# Patient Record
Sex: Male | Born: 1935 | Race: White | Hispanic: No | Marital: Married | Smoking: Former smoker
Health system: Southern US, Community
[De-identification: ages and names within clinical notes are randomized; demographics above are authoritative.]

## PROBLEM LIST (undated history)

## (undated) DIAGNOSIS — M1990 Unspecified osteoarthritis, unspecified site: Secondary | ICD-10-CM

## (undated) DIAGNOSIS — R399 Unspecified symptoms and signs involving the genitourinary system: Secondary | ICD-10-CM

## (undated) DIAGNOSIS — I519 Heart disease, unspecified: Secondary | ICD-10-CM

## (undated) DIAGNOSIS — I10 Essential (primary) hypertension: Secondary | ICD-10-CM

## (undated) DIAGNOSIS — I444 Left anterior fascicular block: Secondary | ICD-10-CM

## (undated) DIAGNOSIS — J3489 Other specified disorders of nose and nasal sinuses: Secondary | ICD-10-CM

## (undated) DIAGNOSIS — F329 Major depressive disorder, single episode, unspecified: Secondary | ICD-10-CM

## (undated) DIAGNOSIS — K5730 Diverticulosis of large intestine without perforation or abscess without bleeding: Secondary | ICD-10-CM

## (undated) DIAGNOSIS — R0600 Dyspnea, unspecified: Secondary | ICD-10-CM

## (undated) DIAGNOSIS — I7121 Aneurysm of the ascending aorta, without rupture: Secondary | ICD-10-CM

## (undated) DIAGNOSIS — S82853A Displaced trimalleolar fracture of unspecified lower leg, initial encounter for closed fracture: Secondary | ICD-10-CM

## (undated) DIAGNOSIS — D649 Anemia, unspecified: Secondary | ICD-10-CM

## (undated) DIAGNOSIS — D3501 Benign neoplasm of right adrenal gland: Secondary | ICD-10-CM

## (undated) DIAGNOSIS — F32A Depression, unspecified: Secondary | ICD-10-CM

## (undated) DIAGNOSIS — E785 Hyperlipidemia, unspecified: Secondary | ICD-10-CM

## (undated) DIAGNOSIS — Z860100 Personal history of colon polyps, unspecified: Secondary | ICD-10-CM

## (undated) DIAGNOSIS — I517 Cardiomegaly: Secondary | ICD-10-CM

## (undated) DIAGNOSIS — I2510 Atherosclerotic heart disease of native coronary artery without angina pectoris: Secondary | ICD-10-CM

## (undated) DIAGNOSIS — G4733 Obstructive sleep apnea (adult) (pediatric): Secondary | ICD-10-CM

## (undated) DIAGNOSIS — R351 Nocturia: Secondary | ICD-10-CM

## (undated) DIAGNOSIS — I700 Atherosclerosis of aorta: Secondary | ICD-10-CM

## (undated) DIAGNOSIS — J449 Chronic obstructive pulmonary disease, unspecified: Secondary | ICD-10-CM

## (undated) DIAGNOSIS — C801 Malignant (primary) neoplasm, unspecified: Secondary | ICD-10-CM

## (undated) DIAGNOSIS — R2681 Unsteadiness on feet: Secondary | ICD-10-CM

## (undated) DIAGNOSIS — I272 Pulmonary hypertension, unspecified: Secondary | ICD-10-CM

## (undated) DIAGNOSIS — N400 Enlarged prostate without lower urinary tract symptoms: Secondary | ICD-10-CM

## (undated) DIAGNOSIS — R05 Cough: Secondary | ICD-10-CM

## (undated) DIAGNOSIS — E291 Testicular hypofunction: Secondary | ICD-10-CM

## (undated) DIAGNOSIS — R918 Other nonspecific abnormal finding of lung field: Secondary | ICD-10-CM

## (undated) DIAGNOSIS — R053 Chronic cough: Secondary | ICD-10-CM

## (undated) DIAGNOSIS — S68119A Complete traumatic metacarpophalangeal amputation of unspecified finger, initial encounter: Secondary | ICD-10-CM

## (undated) DIAGNOSIS — Z87442 Personal history of urinary calculi: Secondary | ICD-10-CM

## (undated) DIAGNOSIS — R42 Dizziness and giddiness: Secondary | ICD-10-CM

## (undated) DIAGNOSIS — I340 Nonrheumatic mitral (valve) insufficiency: Secondary | ICD-10-CM

## (undated) DIAGNOSIS — I82409 Acute embolism and thrombosis of unspecified deep veins of unspecified lower extremity: Secondary | ICD-10-CM

## (undated) DIAGNOSIS — I739 Peripheral vascular disease, unspecified: Secondary | ICD-10-CM

## (undated) DIAGNOSIS — J189 Pneumonia, unspecified organism: Secondary | ICD-10-CM

## (undated) DIAGNOSIS — I4510 Unspecified right bundle-branch block: Secondary | ICD-10-CM

## (undated) DIAGNOSIS — K760 Fatty (change of) liver, not elsewhere classified: Secondary | ICD-10-CM

## (undated) DIAGNOSIS — T07XXXA Unspecified multiple injuries, initial encounter: Secondary | ICD-10-CM

## (undated) DIAGNOSIS — I2699 Other pulmonary embolism without acute cor pulmonale: Secondary | ICD-10-CM

## (undated) DIAGNOSIS — Z8719 Personal history of other diseases of the digestive system: Secondary | ICD-10-CM

## (undated) DIAGNOSIS — R001 Bradycardia, unspecified: Secondary | ICD-10-CM

## (undated) DIAGNOSIS — I712 Thoracic aortic aneurysm, without rupture: Secondary | ICD-10-CM

## (undated) HISTORY — DX: Unspecified multiple injuries, initial encounter: T07.XXXA

## (undated) HISTORY — DX: Acute embolism and thrombosis of unspecified deep veins of unspecified lower extremity: I82.409

## (undated) HISTORY — DX: Testicular hypofunction: E29.1

## (undated) HISTORY — DX: Thoracic aortic aneurysm, without rupture: I71.2

## (undated) HISTORY — DX: Chronic obstructive pulmonary disease, unspecified: J44.9

## (undated) HISTORY — DX: Pulmonary hypertension, unspecified: I27.20

## (undated) HISTORY — DX: Major depressive disorder, single episode, unspecified: F32.9

## (undated) HISTORY — PX: LARYNGOSCOPY: SUR817

## (undated) HISTORY — DX: Depression, unspecified: F32.A

## (undated) HISTORY — DX: Aneurysm of the ascending aorta, without rupture: I71.21

## (undated) HISTORY — PX: OMENTECTOMY: SHX2098

## (undated) HISTORY — DX: Malignant (primary) neoplasm, unspecified: C80.1

## (undated) HISTORY — DX: Hyperlipidemia, unspecified: E78.5

## (undated) HISTORY — DX: Other pulmonary embolism without acute cor pulmonale: I26.99

## (undated) HISTORY — PX: CATARACT EXTRACTION W/ INTRAOCULAR LENS  IMPLANT, BILATERAL: SHX1307

## (undated) HISTORY — DX: Atherosclerotic heart disease of native coronary artery without angina pectoris: I25.10

## (undated) HISTORY — DX: Obstructive sleep apnea (adult) (pediatric): G47.33

## (undated) HISTORY — DX: Displaced trimalleolar fracture of unspecified lower leg, initial encounter for closed fracture: S82.853A

## (undated) HISTORY — PX: ANKLE FRACTURE SURGERY: SHX122

## (undated) HISTORY — DX: Essential (primary) hypertension: I10

## (undated) HISTORY — DX: Benign prostatic hyperplasia without lower urinary tract symptoms: N40.0

## (undated) HISTORY — PX: PROSTATE SURGERY: SHX751

---

## 1997-09-23 ENCOUNTER — Encounter: Payer: Self-pay | Admitting: Family Medicine

## 1997-09-23 LAB — CONVERTED CEMR LAB: PSA: 0.4 ng/mL

## 1998-07-24 ENCOUNTER — Encounter: Payer: Self-pay | Admitting: Family Medicine

## 1998-07-24 LAB — CONVERTED CEMR LAB: Hgb A1c MFr Bld: 6 %

## 1998-09-23 ENCOUNTER — Encounter: Payer: Self-pay | Admitting: Family Medicine

## 1999-06-24 ENCOUNTER — Encounter: Payer: Self-pay | Admitting: Family Medicine

## 1999-07-04 ENCOUNTER — Ambulatory Visit (HOSPITAL_COMMUNITY): Admission: RE | Admit: 1999-07-04 | Discharge: 1999-07-04 | Payer: Self-pay | Admitting: Family Medicine

## 1999-11-24 ENCOUNTER — Encounter: Payer: Self-pay | Admitting: Family Medicine

## 2000-01-22 ENCOUNTER — Encounter: Payer: Self-pay | Admitting: Family Medicine

## 2000-01-22 LAB — FECAL OCCULT BLOOD, GUAIAC: Fecal Occult Blood: NEGATIVE

## 2000-01-22 LAB — CONVERTED CEMR LAB
Hgb A1c MFr Bld: 6.1 %
Microalbumin U total vol: 22.7 mg/L

## 2001-03-24 ENCOUNTER — Other Ambulatory Visit: Admission: RE | Admit: 2001-03-24 | Discharge: 2001-03-24 | Payer: Self-pay | Admitting: Otolaryngology

## 2001-03-24 ENCOUNTER — Encounter (INDEPENDENT_AMBULATORY_CARE_PROVIDER_SITE_OTHER): Payer: Self-pay

## 2001-04-08 ENCOUNTER — Ambulatory Visit (HOSPITAL_BASED_OUTPATIENT_CLINIC_OR_DEPARTMENT_OTHER): Admission: RE | Admit: 2001-04-08 | Discharge: 2001-04-08 | Payer: Self-pay | Admitting: Otolaryngology

## 2001-04-08 ENCOUNTER — Encounter (INDEPENDENT_AMBULATORY_CARE_PROVIDER_SITE_OTHER): Payer: Self-pay | Admitting: Specialist

## 2001-06-23 ENCOUNTER — Encounter: Payer: Self-pay | Admitting: Family Medicine

## 2001-07-04 HISTORY — PX: HERNIA REPAIR: SHX51

## 2001-08-23 ENCOUNTER — Encounter: Payer: Self-pay | Admitting: Family Medicine

## 2001-08-23 LAB — CONVERTED CEMR LAB
Microalbumin U total vol: 5.1 mg/L
PSA: 0.6 ng/mL

## 2002-02-21 ENCOUNTER — Encounter: Payer: Self-pay | Admitting: Family Medicine

## 2003-05-29 ENCOUNTER — Ambulatory Visit (HOSPITAL_BASED_OUTPATIENT_CLINIC_OR_DEPARTMENT_OTHER): Admission: RE | Admit: 2003-05-29 | Discharge: 2003-05-29 | Payer: Self-pay | Admitting: Otolaryngology

## 2003-05-29 ENCOUNTER — Encounter (INDEPENDENT_AMBULATORY_CARE_PROVIDER_SITE_OTHER): Payer: Self-pay | Admitting: *Deleted

## 2003-09-24 ENCOUNTER — Encounter: Payer: Self-pay | Admitting: Family Medicine

## 2003-09-24 LAB — CONVERTED CEMR LAB
Hgb A1c MFr Bld: 9.1 %
Microalbumin U total vol: 31.1 mg/L

## 2003-12-25 ENCOUNTER — Encounter: Payer: Self-pay | Admitting: Family Medicine

## 2003-12-25 LAB — CONVERTED CEMR LAB
Hgb A1c MFr Bld: 7.9 %
Hgb A1c MFr Bld: 7.9 %
Microalbumin U total vol: 23.7 mg/L

## 2004-03-23 ENCOUNTER — Encounter: Payer: Self-pay | Admitting: Family Medicine

## 2004-03-23 LAB — CONVERTED CEMR LAB
Hgb A1c MFr Bld: 7 %
PSA: 0.4 ng/mL
PSA: 0.4 ng/mL

## 2004-10-15 ENCOUNTER — Ambulatory Visit: Payer: Self-pay | Admitting: Family Medicine

## 2004-10-23 ENCOUNTER — Encounter: Payer: Self-pay | Admitting: Family Medicine

## 2004-11-04 ENCOUNTER — Ambulatory Visit: Payer: Self-pay | Admitting: Family Medicine

## 2004-11-10 ENCOUNTER — Ambulatory Visit: Payer: Self-pay | Admitting: Family Medicine

## 2005-03-23 ENCOUNTER — Encounter: Payer: Self-pay | Admitting: Family Medicine

## 2005-03-23 LAB — CONVERTED CEMR LAB
Hgb A1c MFr Bld: 5.4 %
Hgb A1c MFr Bld: 5.4 %
Microalbumin U total vol: 26.8 mg/L
Microalbumin U total vol: 26.8 mg/L
PSA: 0.41 ng/mL
PSA: 0.41 ng/mL

## 2005-04-02 ENCOUNTER — Ambulatory Visit: Payer: Self-pay | Admitting: Family Medicine

## 2005-04-06 ENCOUNTER — Ambulatory Visit: Payer: Self-pay | Admitting: Family Medicine

## 2005-04-21 ENCOUNTER — Ambulatory Visit: Payer: Self-pay | Admitting: *Deleted

## 2005-04-22 ENCOUNTER — Ambulatory Visit: Payer: Self-pay

## 2005-08-26 ENCOUNTER — Ambulatory Visit: Payer: Self-pay | Admitting: Family Medicine

## 2005-09-17 ENCOUNTER — Ambulatory Visit: Payer: Self-pay | Admitting: Internal Medicine

## 2005-09-23 ENCOUNTER — Encounter: Payer: Self-pay | Admitting: Family Medicine

## 2005-09-23 LAB — CONVERTED CEMR LAB
Hgb A1c MFr Bld: 6.1 %
Hgb A1c MFr Bld: 6.1 %

## 2005-09-24 ENCOUNTER — Ambulatory Visit: Payer: Self-pay | Admitting: Internal Medicine

## 2005-09-24 ENCOUNTER — Inpatient Hospital Stay (HOSPITAL_BASED_OUTPATIENT_CLINIC_OR_DEPARTMENT_OTHER): Admission: RE | Admit: 2005-09-24 | Discharge: 2005-09-24 | Payer: Self-pay | Admitting: Internal Medicine

## 2005-09-24 HISTORY — PX: CARDIAC CATHETERIZATION: SHX172

## 2005-10-06 ENCOUNTER — Ambulatory Visit: Payer: Self-pay | Admitting: Internal Medicine

## 2005-10-06 ENCOUNTER — Ambulatory Visit (HOSPITAL_COMMUNITY): Admission: RE | Admit: 2005-10-06 | Discharge: 2005-10-06 | Payer: Self-pay | Admitting: Internal Medicine

## 2005-10-09 ENCOUNTER — Ambulatory Visit: Payer: Self-pay | Admitting: Cardiology

## 2005-10-14 ENCOUNTER — Ambulatory Visit: Payer: Self-pay | Admitting: Family Medicine

## 2005-10-23 ENCOUNTER — Ambulatory Visit: Payer: Self-pay | Admitting: Pulmonary Disease

## 2005-11-05 ENCOUNTER — Encounter (HOSPITAL_COMMUNITY): Admission: RE | Admit: 2005-11-05 | Discharge: 2006-02-03 | Payer: Self-pay | Admitting: Pulmonary Disease

## 2005-11-23 ENCOUNTER — Encounter: Payer: Self-pay | Admitting: Family Medicine

## 2005-11-23 LAB — CONVERTED CEMR LAB
Hgb A1c MFr Bld: 6.2 %
Hgb A1c MFr Bld: 6.2 %

## 2005-12-01 ENCOUNTER — Ambulatory Visit: Payer: Self-pay | Admitting: Internal Medicine

## 2005-12-01 ENCOUNTER — Ambulatory Visit: Payer: Self-pay | Admitting: Family Medicine

## 2005-12-02 ENCOUNTER — Ambulatory Visit: Payer: Self-pay | Admitting: Family Medicine

## 2005-12-21 ENCOUNTER — Ambulatory Visit: Payer: Self-pay | Admitting: Pulmonary Disease

## 2005-12-23 ENCOUNTER — Ambulatory Visit: Payer: Self-pay | Admitting: Internal Medicine

## 2005-12-24 ENCOUNTER — Encounter: Payer: Self-pay | Admitting: Family Medicine

## 2005-12-24 LAB — CONVERTED CEMR LAB
Hgb A1c MFr Bld: 5.9 %
Hgb A1c MFr Bld: 5.9 %

## 2005-12-28 ENCOUNTER — Ambulatory Visit: Payer: Self-pay | Admitting: Family Medicine

## 2006-03-23 ENCOUNTER — Encounter: Payer: Self-pay | Admitting: Family Medicine

## 2006-03-23 LAB — CONVERTED CEMR LAB
Hgb A1c MFr Bld: 6 %
PSA: 0.64 ng/mL
PSA: 0.64 ng/mL

## 2006-03-25 ENCOUNTER — Ambulatory Visit: Payer: Self-pay | Admitting: Internal Medicine

## 2006-03-26 ENCOUNTER — Ambulatory Visit: Payer: Self-pay | Admitting: Family Medicine

## 2006-03-29 ENCOUNTER — Ambulatory Visit: Payer: Self-pay | Admitting: Family Medicine

## 2006-04-02 ENCOUNTER — Ambulatory Visit: Payer: Self-pay | Admitting: Family Medicine

## 2006-06-29 ENCOUNTER — Ambulatory Visit: Payer: Self-pay | Admitting: Family Medicine

## 2006-08-24 ENCOUNTER — Ambulatory Visit: Payer: Self-pay | Admitting: Internal Medicine

## 2006-08-31 ENCOUNTER — Ambulatory Visit: Payer: Self-pay | Admitting: Family Medicine

## 2006-09-14 ENCOUNTER — Ambulatory Visit: Payer: Self-pay | Admitting: Emergency Medicine

## 2006-10-02 ENCOUNTER — Ambulatory Visit (HOSPITAL_BASED_OUTPATIENT_CLINIC_OR_DEPARTMENT_OTHER): Admission: RE | Admit: 2006-10-02 | Discharge: 2006-10-02 | Payer: Self-pay | Admitting: Emergency Medicine

## 2006-10-11 ENCOUNTER — Ambulatory Visit: Payer: Self-pay | Admitting: Emergency Medicine

## 2006-10-12 ENCOUNTER — Ambulatory Visit: Payer: Self-pay | Admitting: Pulmonary Disease

## 2006-11-23 ENCOUNTER — Encounter: Payer: Self-pay | Admitting: Family Medicine

## 2006-11-23 LAB — CONVERTED CEMR LAB: Hgb A1c MFr Bld: 6.7 %

## 2006-11-25 ENCOUNTER — Ambulatory Visit: Payer: Self-pay | Admitting: Family Medicine

## 2006-11-26 ENCOUNTER — Ambulatory Visit: Payer: Self-pay | Admitting: Emergency Medicine

## 2006-11-29 ENCOUNTER — Ambulatory Visit: Payer: Self-pay | Admitting: Family Medicine

## 2006-12-16 ENCOUNTER — Ambulatory Visit: Payer: Self-pay | Admitting: Family Medicine

## 2006-12-16 LAB — CONVERTED CEMR LAB
BUN: 17 mg/dL (ref 6–23)
CO2: 32 meq/L (ref 19–32)
Calcium: 10.1 mg/dL (ref 8.4–10.5)
Creatinine, Ser: 1 mg/dL (ref 0.4–1.5)
GFR calc non Af Amer: 78 mL/min
Glucose, Bld: 110 mg/dL — ABNORMAL HIGH (ref 70–99)
Hgb A1c MFr Bld: 6.6 % — ABNORMAL HIGH (ref 4.6–6.0)
Sodium: 138 meq/L (ref 135–145)

## 2006-12-21 ENCOUNTER — Ambulatory Visit: Payer: Self-pay | Admitting: Family Medicine

## 2007-01-22 ENCOUNTER — Encounter: Payer: Self-pay | Admitting: Family Medicine

## 2007-01-22 LAB — CONVERTED CEMR LAB: Hgb A1c MFr Bld: 6.8 %

## 2007-02-04 ENCOUNTER — Ambulatory Visit: Payer: Self-pay | Admitting: Family Medicine

## 2007-02-04 LAB — CONVERTED CEMR LAB
Hgb A1c MFr Bld: 6.8 % — ABNORMAL HIGH (ref 4.6–6.0)
LDL Cholesterol: 110 mg/dL — ABNORMAL HIGH (ref 0–99)

## 2007-02-08 ENCOUNTER — Ambulatory Visit: Payer: Self-pay | Admitting: Family Medicine

## 2007-02-15 ENCOUNTER — Ambulatory Visit: Payer: Self-pay | Admitting: Cardiology

## 2007-02-24 ENCOUNTER — Ambulatory Visit: Payer: Self-pay | Admitting: Internal Medicine

## 2007-03-10 ENCOUNTER — Encounter (INDEPENDENT_AMBULATORY_CARE_PROVIDER_SITE_OTHER): Payer: Self-pay | Admitting: Specialist

## 2007-03-10 ENCOUNTER — Ambulatory Visit: Payer: Self-pay | Admitting: Internal Medicine

## 2007-04-04 ENCOUNTER — Telehealth: Payer: Self-pay | Admitting: Family Medicine

## 2007-04-05 ENCOUNTER — Ambulatory Visit: Payer: Self-pay | Admitting: Family Medicine

## 2007-04-05 DIAGNOSIS — R739 Hyperglycemia, unspecified: Secondary | ICD-10-CM | POA: Insufficient documentation

## 2007-05-05 ENCOUNTER — Encounter: Payer: Self-pay | Admitting: Family Medicine

## 2007-05-05 ENCOUNTER — Ambulatory Visit: Payer: Self-pay | Admitting: Family Medicine

## 2007-05-05 DIAGNOSIS — N400 Enlarged prostate without lower urinary tract symptoms: Secondary | ICD-10-CM | POA: Insufficient documentation

## 2007-05-05 DIAGNOSIS — G4730 Sleep apnea, unspecified: Secondary | ICD-10-CM | POA: Insufficient documentation

## 2007-05-05 DIAGNOSIS — Z9989 Dependence on other enabling machines and devices: Secondary | ICD-10-CM

## 2007-05-05 DIAGNOSIS — I10 Essential (primary) hypertension: Secondary | ICD-10-CM | POA: Insufficient documentation

## 2007-05-05 DIAGNOSIS — I2510 Atherosclerotic heart disease of native coronary artery without angina pectoris: Secondary | ICD-10-CM | POA: Insufficient documentation

## 2007-05-05 DIAGNOSIS — E7800 Pure hypercholesterolemia, unspecified: Secondary | ICD-10-CM

## 2007-05-05 DIAGNOSIS — E113299 Type 2 diabetes mellitus with mild nonproliferative diabetic retinopathy without macular edema, unspecified eye: Secondary | ICD-10-CM | POA: Insufficient documentation

## 2007-05-05 DIAGNOSIS — G4733 Obstructive sleep apnea (adult) (pediatric): Secondary | ICD-10-CM

## 2007-05-05 DIAGNOSIS — E11319 Type 2 diabetes mellitus with unspecified diabetic retinopathy without macular edema: Secondary | ICD-10-CM

## 2007-05-05 LAB — CONVERTED CEMR LAB
HDL: 53.2 mg/dL (ref >39.0)
Hgb A1c MFr Bld: 6.5 % — ABNORMAL HIGH (ref 4.6–6.0)

## 2007-05-07 DIAGNOSIS — F528 Other sexual dysfunction not due to a substance or known physiological condition: Secondary | ICD-10-CM | POA: Insufficient documentation

## 2007-05-09 ENCOUNTER — Ambulatory Visit: Payer: Self-pay | Admitting: Family Medicine

## 2007-06-14 ENCOUNTER — Ambulatory Visit: Payer: Self-pay | Admitting: Internal Medicine

## 2007-06-16 ENCOUNTER — Ambulatory Visit: Payer: Self-pay | Admitting: Family Medicine

## 2007-06-16 DIAGNOSIS — I25118 Atherosclerotic heart disease of native coronary artery with other forms of angina pectoris: Secondary | ICD-10-CM

## 2007-06-20 LAB — CONVERTED CEMR LAB
ALT: 14 units/L (ref 0–53)
Albumin: 3.4 g/dL — ABNORMAL LOW (ref 3.5–5.2)
Alkaline Phosphatase: 33 units/L — ABNORMAL LOW (ref 39–117)
BUN: 23 mg/dL (ref 6–23)
Bilirubin, Direct: 0.1 mg/dL (ref 0.0–0.3)
CO2: 29 meq/L (ref 19–32)
Calcium: 9.1 mg/dL (ref 8.4–10.5)
Chloride: 99 meq/L (ref 96–112)
Creatinine, Ser: 1.2 mg/dL (ref 0.4–1.5)
GFR calc Af Amer: 76 mL/min
GFR calc non Af Amer: 63 mL/min
Glucose, Bld: 109 mg/dL — ABNORMAL HIGH (ref 70–99)
LDL Cholesterol: 114 mg/dL — ABNORMAL HIGH (ref 0–99)
Potassium: 4.1 meq/L (ref 3.5–5.1)
Sodium: 135 meq/L (ref 135–145)
Total CHOL/HDL Ratio: 3.4
Total Protein: 6.6 g/dL (ref 6.0–8.3)
VLDL: 19 mg/dL (ref 0–40)

## 2007-09-16 ENCOUNTER — Ambulatory Visit: Payer: Self-pay | Admitting: Family Medicine

## 2007-09-24 DIAGNOSIS — E669 Obesity, unspecified: Secondary | ICD-10-CM

## 2007-10-26 ENCOUNTER — Ambulatory Visit: Payer: Self-pay | Admitting: Family Medicine

## 2007-10-31 ENCOUNTER — Encounter: Payer: Self-pay | Admitting: Family Medicine

## 2007-11-09 ENCOUNTER — Ambulatory Visit: Payer: Self-pay | Admitting: Family Medicine

## 2007-11-09 LAB — CONVERTED CEMR LAB
ALT: 19 units/L (ref 0–53)
AST: 26 units/L (ref 0–37)
Albumin: 3.6 g/dL (ref 3.5–5.2)
Basophils Absolute: 0 10*3/uL (ref 0.0–0.1)
Basophils Relative: 0 % (ref 0.0–1.0)
Bilirubin, Direct: 0.1 mg/dL (ref 0.0–0.3)
CO2: 28 meq/L (ref 19–32)
Chloride: 107 meq/L (ref 96–112)
Cholesterol: 166 mg/dL (ref 0–200)
Creatinine,U: 100.6 mg/dL
GFR calc non Af Amer: 88 mL/min
Hemoglobin: 11.1 g/dL — ABNORMAL LOW (ref 13.0–17.0)
LDL Cholesterol: 92 mg/dL (ref 0–99)
Lymphocytes Relative: 26.7 % (ref 12.0–46.0)
MCHC: 33.3 g/dL (ref 30.0–36.0)
PSA: 0.61 ng/mL (ref 0.10–4.00)
TSH: 1.12 microintl units/mL (ref 0.35–5.50)
Total CHOL/HDL Ratio: 2.7
VLDL: 11 mg/dL (ref 0–40)

## 2007-11-14 ENCOUNTER — Ambulatory Visit: Payer: Self-pay | Admitting: Family Medicine

## 2007-11-30 ENCOUNTER — Ambulatory Visit: Payer: Self-pay | Admitting: Cardiovascular Disease

## 2007-12-12 ENCOUNTER — Ambulatory Visit: Payer: Self-pay | Admitting: Family Medicine

## 2007-12-12 DIAGNOSIS — M109 Gout, unspecified: Secondary | ICD-10-CM

## 2007-12-12 LAB — CONVERTED CEMR LAB: Creatinine, Ser: 1.1 mg/dL (ref 0.4–1.5)

## 2007-12-15 ENCOUNTER — Ambulatory Visit: Payer: Self-pay

## 2007-12-15 ENCOUNTER — Ambulatory Visit: Payer: Self-pay | Admitting: Family Medicine

## 2007-12-20 ENCOUNTER — Ambulatory Visit: Payer: Self-pay | Admitting: Family Medicine

## 2007-12-20 LAB — CONVERTED CEMR LAB
ALT: 18 units/L (ref 0–53)
Albumin: 3.6 g/dL (ref 3.5–5.2)
Basophils Absolute: 0 10*3/uL (ref 0.0–0.1)
Bilirubin, Direct: 0.1 mg/dL (ref 0.0–0.3)
CO2: 30 meq/L (ref 19–32)
Calcium: 9.6 mg/dL (ref 8.4–10.5)
Chloride: 104 meq/L (ref 96–112)
Cholesterol: 174 mg/dL (ref 0–200)
Creatinine, Ser: 0.9 mg/dL (ref 0.4–1.5)
Eosinophils Absolute: 0.1 10*3/uL (ref 0.0–0.6)
Eosinophils Relative: 2.3 % (ref 0.0–5.0)
GFR calc non Af Amer: 88 mL/min
HDL: 65.2 mg/dL (ref >39.0)
Hgb A1c MFr Bld: 6 % (ref 4.6–6.0)
Monocytes Relative: 9.5 % (ref 3.0–11.0)
Neutro Abs: 3 10*3/uL (ref 1.4–7.7)
Neutrophils Relative %: 62.1 % (ref 43.0–77.0)
Potassium: 4.2 meq/L (ref 3.5–5.1)
RDW: 13.7 % (ref 11.5–14.6)
TSH: 1.72 microintl units/mL (ref 0.35–5.50)
Total Protein: 6.8 g/dL (ref 6.0–8.3)
Uric Acid, Serum: 7 mg/dL (ref 2.4–7.0)
WBC: 4.9 10*3/uL (ref 4.5–10.5)

## 2007-12-22 ENCOUNTER — Ambulatory Visit: Payer: Self-pay | Admitting: Family Medicine

## 2008-01-27 ENCOUNTER — Ambulatory Visit: Payer: Self-pay | Admitting: Internal Medicine

## 2008-02-10 ENCOUNTER — Ambulatory Visit: Payer: Self-pay | Admitting: Internal Medicine

## 2008-02-10 ENCOUNTER — Encounter: Payer: Self-pay | Admitting: Internal Medicine

## 2008-02-10 ENCOUNTER — Encounter: Payer: Self-pay | Admitting: Family Medicine

## 2008-03-01 ENCOUNTER — Ambulatory Visit: Payer: Self-pay | Admitting: Family Medicine

## 2008-03-01 LAB — CONVERTED CEMR LAB
Calcium: 9.3 mg/dL (ref 8.4–10.5)
GFR calc Af Amer: 121 mL/min
GFR calc non Af Amer: 100 mL/min
Potassium: 4.2 meq/L (ref 3.5–5.1)

## 2008-03-05 ENCOUNTER — Ambulatory Visit: Payer: Self-pay | Admitting: Family Medicine

## 2008-05-02 ENCOUNTER — Ambulatory Visit: Payer: Self-pay | Admitting: Family Medicine

## 2008-05-02 LAB — CONVERTED CEMR LAB
Calcium: 9.1 mg/dL (ref 8.4–10.5)
Chloride: 103 meq/L (ref 96–112)
Creatinine, Ser: 0.9 mg/dL (ref 0.4–1.5)
Glucose, Bld: 95 mg/dL (ref 70–99)
Hgb A1c MFr Bld: 6.4 % — ABNORMAL HIGH (ref 4.6–6.0)
Potassium: 4.2 meq/L (ref 3.5–5.1)

## 2008-05-08 ENCOUNTER — Ambulatory Visit: Payer: Self-pay | Admitting: Family Medicine

## 2008-06-06 ENCOUNTER — Ambulatory Visit: Payer: Self-pay | Admitting: Cardiology

## 2008-08-22 ENCOUNTER — Ambulatory Visit: Payer: Self-pay | Admitting: Family Medicine

## 2008-08-22 DIAGNOSIS — F329 Major depressive disorder, single episode, unspecified: Secondary | ICD-10-CM

## 2008-12-05 ENCOUNTER — Encounter: Payer: Self-pay | Admitting: Family Medicine

## 2008-12-06 ENCOUNTER — Ambulatory Visit: Payer: Self-pay | Admitting: Internal Medicine

## 2008-12-20 ENCOUNTER — Ambulatory Visit: Payer: Self-pay | Admitting: Family Medicine

## 2008-12-20 LAB — CONVERTED CEMR LAB
ALT: 18 units/L (ref 0–53)
AST: 23 units/L (ref 0–37)
Basophils Absolute: 0 10*3/uL (ref 0.0–0.1)
Basophils Relative: 0.4 % (ref 0.0–3.0)
Bilirubin, Direct: 0.1 mg/dL (ref 0.0–0.3)
Chloride: 105 meq/L (ref 96–112)
Creatinine,U: 140.3 mg/dL
GFR calc Af Amer: 76 mL/min
Glucose, Bld: 122 mg/dL — ABNORMAL HIGH (ref 70–99)
HDL: 70.1 mg/dL (ref >39.0)
Hgb A1c MFr Bld: 6.4 % — ABNORMAL HIGH (ref 4.6–6.0)
LDL Cholesterol: 86 mg/dL (ref 0–99)
MCHC: 34.1 g/dL (ref 30.0–36.0)
Microalb Creat Ratio: 5.7 mg/g (ref 0.0–30.0)
Monocytes Relative: 11.2 % (ref 3.0–12.0)
Neutro Abs: 2.8 10*3/uL (ref 1.4–7.7)
Neutrophils Relative %: 63.9 % (ref 43.0–77.0)
Platelets: 155 10*3/uL (ref 150–400)
Potassium: 4.6 meq/L (ref 3.5–5.1)
RBC: 3.56 M/uL — ABNORMAL LOW (ref 4.22–5.81)
Total CHOL/HDL Ratio: 2.4
Total Protein: 6.7 g/dL (ref 6.0–8.3)
Triglycerides: 68 mg/dL (ref 0–149)
Uric Acid, Serum: 6.6 mg/dL (ref 4.0–7.8)
WBC: 4.4 10*3/uL — ABNORMAL LOW (ref 4.5–10.5)

## 2008-12-26 ENCOUNTER — Telehealth: Payer: Self-pay | Admitting: Family Medicine

## 2008-12-27 ENCOUNTER — Ambulatory Visit: Payer: Self-pay | Admitting: Family Medicine

## 2008-12-27 DIAGNOSIS — R531 Weakness: Secondary | ICD-10-CM

## 2009-01-08 ENCOUNTER — Ambulatory Visit: Payer: Self-pay | Admitting: Family Medicine

## 2009-01-08 DIAGNOSIS — C4490 Unspecified malignant neoplasm of skin, unspecified: Secondary | ICD-10-CM

## 2009-01-14 ENCOUNTER — Telehealth: Payer: Self-pay | Admitting: Family Medicine

## 2009-01-17 ENCOUNTER — Ambulatory Visit: Payer: Self-pay | Admitting: Family Medicine

## 2009-01-17 DIAGNOSIS — E039 Hypothyroidism, unspecified: Secondary | ICD-10-CM | POA: Insufficient documentation

## 2009-02-19 ENCOUNTER — Ambulatory Visit: Payer: Self-pay | Admitting: Family Medicine

## 2009-02-19 ENCOUNTER — Encounter: Payer: Self-pay | Admitting: Family Medicine

## 2009-02-19 LAB — CONVERTED CEMR LAB
Free T4: 0.8 ng/dL (ref 0.6–1.6)
TSH: 0.6 microintl units/mL (ref 0.35–5.50)

## 2009-02-27 ENCOUNTER — Ambulatory Visit: Payer: Self-pay | Admitting: Family Medicine

## 2009-04-03 ENCOUNTER — Telehealth: Payer: Self-pay | Admitting: Family Medicine

## 2009-04-25 ENCOUNTER — Telehealth: Payer: Self-pay | Admitting: Family Medicine

## 2009-04-30 ENCOUNTER — Encounter: Payer: Self-pay | Admitting: Family Medicine

## 2009-05-06 ENCOUNTER — Ambulatory Visit: Payer: Self-pay | Admitting: Family Medicine

## 2009-05-06 DIAGNOSIS — E291 Testicular hypofunction: Secondary | ICD-10-CM

## 2009-05-24 ENCOUNTER — Encounter: Payer: Self-pay | Admitting: Internal Medicine

## 2009-05-24 ENCOUNTER — Ambulatory Visit: Payer: Self-pay | Admitting: Internal Medicine

## 2009-05-31 ENCOUNTER — Encounter: Payer: Self-pay | Admitting: Family Medicine

## 2009-06-12 ENCOUNTER — Ambulatory Visit: Payer: Self-pay

## 2009-06-12 ENCOUNTER — Encounter: Payer: Self-pay | Admitting: Internal Medicine

## 2009-06-13 ENCOUNTER — Ambulatory Visit: Payer: Self-pay | Admitting: Internal Medicine

## 2009-06-19 LAB — CONVERTED CEMR LAB
AST: 18 units/L (ref 0–37)
Albumin: 3.8 g/dL (ref 3.5–5.2)
Bilirubin, Direct: 0.1 mg/dL (ref 0.0–0.3)
Indirect Bilirubin: 0.3 mg/dL (ref 0.0–0.9)
LDL Cholesterol: 82 mg/dL (ref 0–99)
Total CHOL/HDL Ratio: 2.3
Total Protein: 6.6 g/dL (ref 6.0–8.3)
Triglycerides: 55 mg/dL (ref <150)
VLDL: 11 mg/dL (ref 0–40)

## 2009-06-20 ENCOUNTER — Ambulatory Visit: Payer: Self-pay | Admitting: Family Medicine

## 2009-08-26 ENCOUNTER — Encounter: Payer: Self-pay | Admitting: Family Medicine

## 2009-08-27 ENCOUNTER — Inpatient Hospital Stay (HOSPITAL_COMMUNITY): Admission: EM | Admit: 2009-08-27 | Discharge: 2009-09-02 | Payer: Self-pay | Admitting: Emergency Medicine

## 2009-08-27 DIAGNOSIS — S82899A Other fracture of unspecified lower leg, initial encounter for closed fracture: Secondary | ICD-10-CM | POA: Insufficient documentation

## 2009-09-02 ENCOUNTER — Encounter: Payer: Self-pay | Admitting: Family Medicine

## 2009-09-02 ENCOUNTER — Encounter: Payer: 59 | Admitting: Internal Medicine

## 2009-09-07 ENCOUNTER — Encounter: Payer: Self-pay | Admitting: Family Medicine

## 2009-09-19 ENCOUNTER — Telehealth: Payer: Self-pay | Admitting: Family Medicine

## 2009-09-20 ENCOUNTER — Encounter: Payer: Self-pay | Admitting: Family Medicine

## 2009-09-23 ENCOUNTER — Ambulatory Visit: Payer: Self-pay | Admitting: Family Medicine

## 2009-09-24 ENCOUNTER — Telehealth: Payer: Self-pay | Admitting: Internal Medicine

## 2009-09-24 LAB — CONVERTED CEMR LAB
Basophils Relative: 0.6 % (ref 0.0–3.0)
HCT: 34.8 % — ABNORMAL LOW (ref 39.0–52.0)
Hemoglobin: 12.1 g/dL — ABNORMAL LOW (ref 13.0–17.0)
Lymphocytes Relative: 18.5 % (ref 12.0–46.0)
MCHC: 34.8 g/dL (ref 30.0–36.0)
Monocytes Relative: 8.3 % (ref 3.0–12.0)
RDW: 14 % (ref 11.5–14.6)

## 2009-09-30 ENCOUNTER — Encounter: Payer: 59 | Admitting: Orthopedic Surgery

## 2009-10-08 ENCOUNTER — Encounter: Payer: Self-pay | Admitting: Family Medicine

## 2009-10-23 ENCOUNTER — Encounter: Payer: 59 | Admitting: Orthopedic Surgery

## 2009-11-04 ENCOUNTER — Encounter: Payer: Self-pay | Admitting: Family Medicine

## 2009-12-18 ENCOUNTER — Ambulatory Visit: Payer: Self-pay | Admitting: Internal Medicine

## 2009-12-24 ENCOUNTER — Ambulatory Visit: Payer: Self-pay | Admitting: Family Medicine

## 2009-12-24 LAB — CONVERTED CEMR LAB
ALT: 17 units/L (ref 0–53)
AST: 24 units/L (ref 0–37)
Alkaline Phosphatase: 38 units/L — ABNORMAL LOW (ref 39–117)
Basophils Absolute: 0 10*3/uL (ref 0.0–0.1)
Basophils Relative: 0.5 % (ref 0.0–3.0)
Bilirubin, Direct: 0.1 mg/dL (ref 0.0–0.3)
CO2: 29 meq/L (ref 19–32)
Creatinine, Ser: 1.1 mg/dL (ref 0.4–1.5)
Creatinine,U: 91.2 mg/dL
Eosinophils Absolute: 0.1 10*3/uL (ref 0.0–0.7)
Eosinophils Relative: 2.5 % (ref 0.0–5.0)
GFR calc non Af Amer: 69.01 mL/min (ref >60)
Glucose, Bld: 98 mg/dL (ref 70–99)
HCT: 39.5 % (ref 39.0–52.0)
HDL: 58.8 mg/dL (ref >39.00)
Hemoglobin: 12.9 g/dL — ABNORMAL LOW (ref 13.0–17.0)
MCV: 99 fL (ref 78.0–100.0)
Microalb Creat Ratio: 5.5 mg/g (ref 0.0–30.0)
Monocytes Relative: 10.9 % (ref 3.0–12.0)
Neutro Abs: 3.3 10*3/uL (ref 1.4–7.7)
Neutrophils Relative %: 65 % (ref 43.0–77.0)
PSA: 1.34 ng/mL (ref 0.10–4.00)
Platelets: 182 10*3/uL (ref 150.0–400.0)
RBC: 3.99 M/uL — ABNORMAL LOW (ref 4.22–5.81)
Sodium: 139 meq/L (ref 135–145)
Total Bilirubin: 0.3 mg/dL (ref 0.3–1.2)
Total CHOL/HDL Ratio: 3
Triglycerides: 61 mg/dL (ref 0.0–149.0)

## 2009-12-30 ENCOUNTER — Ambulatory Visit: Payer: Self-pay | Admitting: Family Medicine

## 2010-03-03 ENCOUNTER — Telehealth: Payer: Self-pay | Admitting: Family Medicine

## 2010-03-14 ENCOUNTER — Encounter: Payer: Self-pay | Admitting: Family Medicine

## 2010-03-17 ENCOUNTER — Encounter (INDEPENDENT_AMBULATORY_CARE_PROVIDER_SITE_OTHER): Payer: Self-pay | Admitting: *Deleted

## 2010-03-21 ENCOUNTER — Encounter (INDEPENDENT_AMBULATORY_CARE_PROVIDER_SITE_OTHER): Payer: Self-pay | Admitting: *Deleted

## 2010-03-26 ENCOUNTER — Encounter: Payer: Self-pay | Admitting: Family Medicine

## 2010-03-31 ENCOUNTER — Encounter: Payer: Self-pay | Admitting: Family Medicine

## 2010-04-02 ENCOUNTER — Encounter (INDEPENDENT_AMBULATORY_CARE_PROVIDER_SITE_OTHER): Payer: Self-pay

## 2010-04-04 ENCOUNTER — Encounter (INDEPENDENT_AMBULATORY_CARE_PROVIDER_SITE_OTHER): Payer: Self-pay

## 2010-04-04 ENCOUNTER — Ambulatory Visit: Payer: Self-pay | Admitting: Internal Medicine

## 2010-04-08 ENCOUNTER — Ambulatory Visit: Payer: Self-pay | Admitting: Family Medicine

## 2010-04-08 DIAGNOSIS — S139XXA Sprain of joints and ligaments of unspecified parts of neck, initial encounter: Secondary | ICD-10-CM | POA: Insufficient documentation

## 2010-04-10 ENCOUNTER — Ambulatory Visit: Payer: Self-pay | Admitting: Internal Medicine

## 2010-04-10 LAB — HM COLONOSCOPY

## 2010-04-17 ENCOUNTER — Encounter: Payer: Self-pay | Admitting: Internal Medicine

## 2010-04-20 HISTORY — PX: COLONOSCOPY W/ POLYPECTOMY: SHX1380

## 2010-05-12 ENCOUNTER — Encounter: Payer: Self-pay | Admitting: Family Medicine

## 2010-06-30 ENCOUNTER — Encounter (INDEPENDENT_AMBULATORY_CARE_PROVIDER_SITE_OTHER): Payer: Self-pay | Admitting: *Deleted

## 2010-09-23 ENCOUNTER — Ambulatory Visit: Payer: Self-pay | Admitting: Family Medicine

## 2010-10-27 ENCOUNTER — Telehealth: Payer: Self-pay | Admitting: Family Medicine

## 2010-11-18 ENCOUNTER — Emergency Department: Payer: Self-pay | Admitting: Emergency Medicine

## 2010-12-16 ENCOUNTER — Encounter: Payer: Self-pay | Admitting: Internal Medicine

## 2010-12-16 ENCOUNTER — Ambulatory Visit
Admission: RE | Admit: 2010-12-16 | Discharge: 2010-12-16 | Payer: Self-pay | Source: Home / Self Care | Attending: Internal Medicine | Admitting: Internal Medicine

## 2010-12-25 NOTE — Letter (Signed)
Summary: [facility] Instructions  [facility]  [address] [place], [place] [address]   Phone: [phone]  Fax: [phone]       BARTLEY VUOLO    07/10/1931    MRN: [id number]        Procedure Day Dorna Bloom:  Lenor Coffin  04/10/10     Arrival Time:  9:30AM     Procedure Time:  10:30AM     Location of Procedure:                    _ X_  [facility] (4th Floor)                       PREPARATION FOR COLONOSCOPY WITH MOVIPREP   Starting 5 days prior to your procedure 04/05/10 do not eat nuts, seeds, popcorn, corn, beans, peas,  salads, or any raw vegetables.  Do not take any fiber supplements (e.g. Metamucil, Citrucel, and Benefiber).  THE DAY BEFORE YOUR PROCEDURE         DATE: 04/09/10  DAY: WEDNESDAY  1.  Drink clear liquids the entire day-NO SOLID FOOD  2.  Do not drink anything colored red or purple.  Avoid juices with pulp.  No orange juice.  3.  Drink at least 64 oz. (8 glasses) of fluid/clear liquids during the day to prevent dehydration and help the prep work efficiently.  CLEAR LIQUIDS INCLUDE: Water Jello Ice Popsicles Tea (sugar ok, no milk/cream) Powdered fruit flavored drinks Coffee (sugar ok, no milk/cream) Gatorade Juice: apple, white grape, white cranberry  Lemonade Clear bullion, consomm, broth Carbonated beverages (any kind) Strained chicken noodle soup Hard Candy                             4.  In the morning, mix first dose of MoviPrep solution:    Empty 1 Pouch A and 1 Pouch B into the disposable container    Add lukewarm drinking water to the top line of the container. Mix to dissolve    Refrigerate (mixed solution should be used within 24 hrs)  5.  Begin drinking the prep at 5:00 p.m. The MoviPrep container is divided by 4 marks.   Every 15 minutes drink the solution down to the next mark (approximately 8 oz) until the full liter is complete.   6.  Follow completed prep with 16 oz of clear liquid of your choice  (Nothing red or purple).  Continue to drink clear liquids until bedtime.  7.  Before going to bed, mix second dose of MoviPrep solution:    Empty 1 Pouch A and 1 Pouch B into the disposable container    Add lukewarm drinking water to the top line of the container. Mix to dissolve    Refrigerate  THE DAY OF YOUR PROCEDURE      DATE: 04/10/10  DAY: THURSDAY  Beginning at 5:30AM (5 hours before procedure):         1. Every 15 minutes, drink the solution down to the next mark (approx 8 oz) until the full liter is complete.  2. Follow completed prep with 16 oz. of clear liquid of your choice.    3. You may drink clear liquids until 8;30AM (2 HOURS BEFORE PROCEDURE).   MEDICATION INSTRUCTIONS  Unless otherwise instructed, you should take regular prescription medications with a small sip of water   as early as possible the morning of  your procedure.  Diabetic patients - see separate instructions.  Additional medication instructions: Do not take HCTZ am of procedure.         OTHER INSTRUCTIONS  You will need a responsible adult at least 75 years of age to accompany you and drive you home.   This person must remain in the waiting room during your procedure.  Wear loose fitting clothing that is easily removed.  Leave jewelry and other valuables at home.  However, you may wish to bring a book to read or  an iPod/MP3 player to listen to music as you wait for your procedure to start.  Remove all body piercing jewelry and leave at home.  Total time from sign-in until discharge is approximately 2-3 hours.  You should go home directly after your procedure and rest.  You can resume normal activities the  day after your procedure.  The day of your procedure you should not:   Drive   Make legal decisions   Operate machinery   Drink alcohol   Return to work  You will receive specific instructions about eating, activities and medications before you leave.    The above  instructions have been reviewed and explained to me by   Ulis Rias RN  Apr 04, 2010 75:57 AM     I fully understand and can verbalize these instructions _____________________________ Date _________

## 2010-12-25 NOTE — Letter (Signed)
Summary: Christopher Santiago Examination Report  Dr.Sara Stoneburner,Eye Examination Report   Imported By: Beau Fanny 03/27/2010 16:12:19  _____________________________________________________________________  External Attachment:    Type:   Image     Comment:   External Document  Appended Document: Christopher Santiago Examination Report     Clinical Lists Changes  Observations: Added new observation of DMEYEEXAMNXT: 03/2011 (03/27/2010 17:24) Added new observation of DMEYEEXMRES: normal (03/26/2010 17:24) Added new observation of EYE EXAM BY: Dr Dagoberto Ligas (03/26/2010 17:24) Added new observation of DIAB EYE EX: normal (03/26/2010 17:24)        Diabetes Management Exam:    Eye Exam:       Eye Exam done elsewhere          Date: 03/26/2010          Results: normal          Done by: Dr Dagoberto Ligas

## 2010-12-25 NOTE — Miscellaneous (Signed)
Summary: [facility] EQUIPMENT NEEDED  [facility] NEED[facility]   Imported By: Carin Primrose 03/18/2010 12:44:57  _____________________________________________________________________  External Attachment:    Type:   Image     Comment:   External Document

## 2010-12-25 NOTE — Miscellaneous (Signed)
Summary: Lec previsit  Clinical Lists Changes  Medications: Added new medication of MOVIPREP 100 GM  SOLR (PEG-KCL-NACL-NASULF-NA ASC-C) As per prep instructions. - Signed Rx of MOVIPREP 100 GM  SOLR (PEG-KCL-NACL-NASULF-NA ASC-C) As per prep instructions.;  #1 x 0;  Signed;  Entered by: Ulis Rias RN;  Authorized by: Iva Boop MD, [facility];  Method used: Electronically to [facility]*, [address], [place], [place]  [address], Ph: [phone], Fax: [phone] Observations: Added new observation of ALLERGY REV: Done (04/04/2010 10:21)    Prescriptions: MOVIPREP 100 GM  SOLR (PEG-KCL-NACL-NASULF-NA ASC-C) As per prep instructions.  #1 x 0   Entered by:   Ulis Rias RN   Authorized by:   Iva Boop MD, [facility]   Signed by:   Ulis Rias RN on 04/04/2010   Method used:   Electronically to        [facility]* (retail)       [address]       [place], [place]  [address]       Ph: [phone]       Fax: [phone]   RxID:   [id number]

## 2010-12-25 NOTE — Progress Notes (Signed)
Summary: ? HCTZ refill  Phone Note Refill Request Call back at [phone] Message from:  [facility] on October 27, 2010 10:07 AM  Received a refill request from [facility] for HCTZ 25mg , take one by mouth daily. Med list shows that this was removed on 09/23/10. Please confirm.  Initial call taken by: Sydell Axon LPN,  October 27, 2010 10:10 AM  Follow-up for Phone Call        I talked to patient. He is still on it.  Please add it back to the med list (HCTZ 25mg  a day).  He gets 120 day supply.  Please send in 120 with 3rf.  thanks.  Follow-up by: Crawford Givens MD,  October 27, 2010 1:15 PM  Additional Follow-up for Phone Call Additional follow up Details #1::        Refill sent to pharmacy. Additional Follow-up by: Sydell Axon LPN,  October 27, 2010 1:22 PM    New/Updated Medications: HYDROCHLOROTHIAZIDE 25 MG TABS (HYDROCHLOROTHIAZIDE) Take one by mouth daily  Appended Document: ? HCTZ refill Prescriptions: HYDROCHLOROTHIAZIDE 25 MG TABS (HYDROCHLOROTHIAZIDE) Take one by mouth daily  #120 x 3   Entered by:   Sydell Axon LPN   Authorized by:   Crawford Givens MD   Signed by:   Sydell Axon LPN on 95/28/4132   Method used:   Electronically to        [facility]* (retail)             ,          Ph: [phone]       Fax: [phone]   RxID:   [id number]

## 2010-12-25 NOTE — Letter (Signed)
Summary: Diabetic Instructions  [facility]  [address] [place], [place] [address]   Phone: [phone]  Fax: [phone]    NOLYN SWAB 05/19/1931 MRN: [id number]   _  _   ORAL DIABETIC MEDICATION INSTRUCTIONS  The day before your procedure:   Take your diabetic pill as you do normally  The day of your procedure:   Do not take your diabetic pill    We will check your blood sugar levels during the admission process and again in Recovery before discharging you home  ________________________________________________________________________  _  _   INSULIN (LONG ACTING) MEDICATION INSTRUCTIONS (Lantus, NPH, 70/30, Humulin, Novolin-N)   The day before your procedure:   Take  your regular evening dose    The day of your procedure:   Do not take your morning dose    _  _   INSULIN (SHORT ACTING) MEDICATION INSTRUCTIONS (Regular, Humulog, Novolog)   The day before your procedure:   Do not take your evening dose   The day of your procedure:   Do not take your morning dose   _  _   INSULIN PUMP MEDICATION INSTRUCTIONS  We will contact the physician managing your diabetic care for written dosage instructions for the day before your procedure and the day of your procedure.  Once we have received the instructions, we will contact you.

## 2010-12-25 NOTE — Assessment & Plan Note (Signed)
Summary: ROV/AMD  Medications Added VITAMIN D 1000 UNIT TABS (CHOLECALCIFEROL) 1 tablet by mouth once daily      Allergies Added:   Visit Type:  ROV  CC:  Swelling in left ankle due to recovering from it being brooken.  History of Present Illness: Christopher Santiago is a delightful 77 year old man with a historyof coronary artery disease with 70-80% lesion in the  marginal branch, hypertension, hyperlipidemia, diabetes, PAD, and COPD.  He returns today for routine followup.   In October fell coming out of shower and broke L ankle in 3 places and had to have surgery.  At last visit, we decreased his amlodpine due to fatigue and borderline low BP. Says his energy is better now but feels it may be due to starting testoserone and synthroid as well. Also depression has much improved. Still goin to fitness center 2-3x per week for useing elliptical, Nu-step and weights  No CP. Stable exertional dyspnea. Using CPAP. SBP typically 110-125.     Current Medications (verified): 1)  Actos 45 Mg Tabs (Pioglitazone Hcl) .... One Tab By Mouth  Daily 2)  Spiriva Handihaler 18 Mcg Caps (Tiotropium Bromide Monohydrate) .... One Inh Once A Day 3)  Indomethacin 50 Mg Caps (Indomethacin) .... As Needed 4)  Proventil Hfa 108 (90 Base) Mcg/act Aers (Albuterol Sulfate) .... As Needed 5)  Bayer Low Strength 81 Mg  Tbec (Aspirin) .Marland Kitchen.. 1 By Mouth Daily 6)  Docusate Sodium   Tabs (Docusate Sodium Tabs) .... Daily// As Needed 7)  Viagra 100 Mg  Tabs (Sildenafil Citrate) .... As Needed 8)  Hydrochlorothiazide 25 Mg  Tabs (Hydrochlorothiazide) .... Take 1 Tablet By Mouth Once A Day 9)  Allopurinol 100 Mg  Tabs (Allopurinol) .... 2 Tabs Daily By Mouth With Food 10)  Onetouch Test  Strp (Glucose Blood) .... Check Blood Sugar 3 Times A Week or As Needed 11)  Citalopram Hydrobromide 40 Mg Tabs (Citalopram Hydrobromide) .Marland Kitchen.. 1 Daily By Mouth 12)  Pravastatin Sodium 10 Mg Tabs (Pravastatin Sodium) .Marland Kitchen.. 1 Daily By Mouth 13)   Metformin Hcl 500 Mg Tabs (Metformin Hcl) .Marland Kitchen.. 1 Tablets Twice A Day By Mouth 14)  Levitra 20 Mg Tabs (Vardenafil Hcl) .... As Directed 15)  Armour Thyroid 60 Mg Tabs (Thyroid) .... One Tab By Mouth Once Daily 16)  Androgel 50 Mg/5gm Gel (Testosterone) .... Apply Once Daily 17)  Benicar 20 Mg Tabs (Olmesartan Medoxomil) .... One Tab By Mouth Once Daily 18)  Armour Thyroid 60 Mg Tabs (Thyroid) .... One Tab By Mouth Daily 19)  Vitamin D 1000 Unit Tabs (Cholecalciferol) .Marland Kitchen.. 1 Tablet By Mouth Once Daily  Allergies (verified): 1)  ! * Welbutrin  Past History:  Past Medical History: Last updated: 05/05/2007 COPD Coronary artery disease Depression Diabetes mellitus, type II Hyperlipidemia Hypertension Benign prostatic hypertrophy  Review of Systems       As per HPI and past medical history; otherwise all systems negative.   Vital Signs:  Patient profile:   77 year old male Height:      70 inches Weight:      226.75 pounds BMI:     32.65 Pulse rate:   78 / minute Pulse rhythm:   regular BP sitting:   134 / 50  (left arm) Cuff size:   regular  Vitals Entered By: Mercer Pod (December 18, 2009 10:45 AM)  Physical Exam  General:  General:   He is in no acute distress.  Ambulates around the clinic without any respiratory difficulty. HEENT:  Normal. NECK:  Supple.  No JVD.  Carotids are 2+ bilaterally without any bruits. There is no lymphadenopathy or thyromegaly. CARDIAC:  PMI is nondisplaced.  He is regular with distant heart sounds.No murmurs. LUNGS:  Decreased air movement throughout.  No wheezing. ABDOMEN:  Obese, nontender, nondistended.  Good bowel sounds.   EXTREMITIES:  Warm with no cyanosis or clubbing.  Mild to moderate swelling of left ankle at surgical site.  No rash. Extensive bruising. NEUROLOGIC:  Alert and oriented x3.  Cranial nerves II through XII are intact.  Moves all four extremities without difficulty.  Affect is pleasant.    Impression &  Recommendations:  Problem # 1:  CORONARY ARTERY DISEASE (ICD-414.00) Stable. No evidence of ischemia. Continue current regimen. Exercise tolerance stable.  Problem # 2:  HYPERTENSION (ICD-401.9) Blood pressure well controlled. Continue current regimen. Called PCPs office to make sure he will have BMET with routine labs to f/u on potassium and renal function with recent switch from amlodipine to Benicar.  Other Orders: T-Comprehensive Metabolic Panel [phone])  Patient Instructions: 1)  Your physician recommends that you schedule a follow-up appointment in: 12 months

## 2010-12-25 NOTE — Assessment & Plan Note (Signed)
Summary: WANTS TO DISCUSS MEDICATION CYD   Vital Signs:  Patient profile:   77 year old male Weight:      226.75 pounds Temp:     98.6 degrees F oral Pulse rate:   72 / minute Pulse rhythm:   regular BP sitting:   126 / 64  (left arm) Cuff size:   large  Vitals Entered By: Sydell Axon LPN (Apr 08, 2010 8:55 AM) CC: Discuss medications, having soreness in hips and blood pressure has been running low   History of Present Illness: Pt here to discuss...has difficulty with shoulders and hips. This has come on slowly over time.  He also has noted low BP and feeling dizzy....esp when doing work. he also has a sdtiff right neck into the right shoulder. No inciting incident of which he is aware.  Problems Prior to Update: 1)  Fracture, Ankle, Left Trimalleolar  (ICD-824.8) 2)  Testicular Hypofunction  (ICD-257.2) 3)  Hypothyroidism, Secondary  (ICD-244.8) 4)  Carcinoma, Skin, Squamous Cell  (ICD-173.9) 5)  Malaise and Fatigue  (ICD-780.79) 6)  Gout, Unspecified  (ICD-274.9) 7)  Knee Pain, Right, Presumed Bursitis.  (ICD-719.46) 8)  Obesity  (ICD-278.00) 9)  Atherosclerosis, Coronary, Native Artery  (ICD-414.01) 10)  Sleep Apnea  (ICD-780.57) 11)  Right Vocal Cord Papillary Scc  () 12)  Vertebral Basalar Ischemic Disease  () 13)  Obstructive Sleep Apnea  (ICD-327.23) 14)  Diabetic Retinopathy, Background, Mild  (ICD-362.01) 15)  Erectile Dysfunction  (ICD-302.72) 16)  Benign Prostatic Hypertrophy  (ICD-600.00) 17)  Hypertension  (ICD-401.9) 18)  Depression  (ICD-311) 19)  Coronary Artery Disease  (ICD-414.00) 20)  Copd, Mild  (ICD-496) 21)  Hypercholesterolemia, Pure  (ICD-272.0) 22)  Aodm  (ICD-250.00)  Medications Prior to Update: 1)  Actos 45 Mg Tabs (Pioglitazone Hcl) .... One Tab By Mouth  Daily 2)  Spiriva Handihaler 18 Mcg Caps (Tiotropium Bromide Monohydrate) .... One Inh Once A Day 3)  Indomethacin 50 Mg Caps (Indomethacin) .... As Needed 4)  Proventil Hfa 108 (90  Base) Mcg/act Aers (Albuterol Sulfate) .... As Needed 5)  Bayer Low Strength 81 Mg  Tbec (Aspirin) .Marland Kitchen.. 1 By Mouth Daily 6)  Docusate Sodium   Tabs (Docusate Sodium Tabs) .... Daily// As Needed 7)  Viagra 100 Mg  Tabs (Sildenafil Citrate) .... As Needed 8)  Hydrochlorothiazide 25 Mg  Tabs (Hydrochlorothiazide) .... Take 1 Tablet By Mouth Once A Day 9)  Allopurinol 100 Mg  Tabs (Allopurinol) .... 2 Tabs Daily By Mouth With Food 10)  Onetouch Test  Strp (Glucose Blood) .... Check Blood Sugar 3 Times A Week or As Needed 11)  Citalopram Hydrobromide 40 Mg Tabs (Citalopram Hydrobromide) .Marland Kitchen.. 1 Daily By Mouth 12)  Pravastatin Sodium 10 Mg Tabs (Pravastatin Sodium) .Marland Kitchen.. 1 Daily By Mouth 13)  Metformin Hcl 500 Mg Tabs (Metformin Hcl) .Marland Kitchen.. 1 Tablets Twice A Day By Mouth 14)  Levitra 20 Mg Tabs (Vardenafil Hcl) .... As Directed 15)  Benicar 20 Mg Tabs (Olmesartan Medoxomil) .... One Tab By Mouth Once Daily 16)  Armour Thyroid 60 Mg Tabs (Thyroid) .... One Tab By Mouth Daily 17)  Vitamin D 1000 Unit Tabs (Cholecalciferol) .Marland Kitchen.. 1 Tablet By Mouth Once Daily 18)  Testosterone Injection 200mg  .... Takes One Injection Every 2 [employer] Per Urologist 19)  Moviprep 100 Gm  Solr (Peg-Kcl-Nacl-Nasulf-Na Asc-C) .... As Per Prep Instructions.  Allergies: 1)  ! * Welbutrin  Physical Exam  General:  Well-developed,well-nourished,in no acute distress; alert,appropriate and cooperative throughout examination  Head:  Normocephalic and atraumatic without obvious abnormalities. No apparent alopecia or balding. Eyes:  Conjunctiva clear bilaterally.  Ears:  External ear exam shows no significant lesions or deformities.  Otoscopic examination reveals clear canals, tympanic membranes are intact bilaterally without bulging, retraction, inflammation or discharge. Hearing is grossly normal bilaterally. H/As in ears bilat. Nose:  External nasal examination shows no deformity or inflammation. Nasal mucosa are pink and moist without  lesions or exudates. Mouth:  Oral mucosa and oropharynx without lesions or exudates.  Teeth in good repair. Mild white PND. Neck:  No deformities, masses, or tenderness noted. Tight upper fibers of trapezius bilat. Chest Wall:  No deformities, masses, tenderness or gynecomastia noted. Lungs:  Normal respiratory effort, chest expands symmetrically. Lungs are clear to auscultation, no crackles or wheezes. Heart:  Normal rate and regular rhythm. S1 and S2 normal without gallop, murmur, click, rub or other extra sounds.   Impression & Recommendations:  Problem # 1:  HYPERCHOLESTEROLEMIA, PURE (ICD-272.0) Assessment Unchanged Well controlled but having jopint aches much like when he took Crestor. Will try suppl with Co Q 10. See instructions.  His updated medication list for this problem includes:    Pravastatin Sodium 10 Mg Tabs (Pravastatin sodium) .Marland Kitchen... 1 daily by mouth  Problem # 2:  HYPERTENSION (ICD-401.9) Assessment: Improved Too good, gets low at home. Stop HCTZ and monitor BP. Better for gout and kidney fctn as well as DM. His updated medication list for this problem includes:    Hydrochlorothiazide 25 Mg Tabs (Hydrochlorothiazide) .Marland Kitchen... Take 1 tablet by mouth once a day    Benicar 20 Mg Tabs (Olmesartan medoxomil) ..... One tab by mouth once daily  BP today: 126/64 Prior BP: 128/60 (12/30/2009)  Labs Reviewed: K+: 4.6 (12/24/2009) Creat: : 1.1 (12/24/2009)   Chol: 158 (12/24/2009)   HDL: 58.80 (12/24/2009)   LDL: 87 (12/24/2009)   TG: 61.0 (12/24/2009)  Problem # 3:  CERVICAL STRAIN, ACUTE (ICD-847.0) Assessment: New  Discussed heat and ice. Do not use Indocin.  Try Flexeril as needed. Avoid frequent brace use. His updated medication list for this problem includes:    Indomethacin 50 Mg Caps (Indomethacin) .Marland Kitchen... As needed    Bayer Low Strength 81 Mg Tbec (Aspirin) .Marland Kitchen... 1 by mouth daily    Flexeril 10 Mg Tabs (Cyclobenzaprine hcl) .Marland Kitchen... 1/2 -1 tab by mouth three times a  day  Discussed exercises and use of moist heat or cold and medication.   Complete Medication List: 1)  Actos 45 Mg Tabs (Pioglitazone hcl) .... One tab by mouth  daily 2)  Spiriva Handihaler 18 Mcg Caps (Tiotropium bromide monohydrate) .... One inh once a day 3)  Indomethacin 50 Mg Caps (Indomethacin) .... As needed 4)  Proventil Hfa 108 (90 Base) Mcg/act Aers (Albuterol sulfate) .... As needed 5)  Bayer Low Strength 81 Mg Tbec (Aspirin) .Marland Kitchen.. 1 by mouth daily 6)  Docusate Sodium Tabs (Docusate sodium tabs) .... Daily// as needed 7)  Viagra 100 Mg Tabs (Sildenafil citrate) .... As needed 8)  Hydrochlorothiazide 25 Mg Tabs (Hydrochlorothiazide) .... Take 1 tablet by mouth once a day 9)  Allopurinol 100 Mg Tabs (Allopurinol) .... 2 tabs daily by mouth with food 10)  Onetouch Test Strp (Glucose blood) .... Check blood sugar 3 times a week or as needed 11)  Citalopram Hydrobromide 40 Mg Tabs (Citalopram hydrobromide) .Marland Kitchen.. 1 daily by mouth 12)  Pravastatin Sodium 10 Mg Tabs (Pravastatin sodium) .Marland Kitchen.. 1 daily by mouth 13)  Metformin Hcl 500 Mg  Tabs (Metformin hcl) .Marland Kitchen.. 1 tablets twice a day by mouth 14)  Levitra 20 Mg Tabs (Vardenafil hcl) .... As directed 15)  Benicar 20 Mg Tabs (Olmesartan medoxomil) .... One tab by mouth once daily 16)  Armour Thyroid 60 Mg Tabs (Thyroid) .... One tab by mouth daily 17)  Vitamin D 1000 Unit Tabs (Cholecalciferol) .Marland Kitchen.. 1 tablet by mouth once daily 18)  Testosterone Injection 200mg   .... Takes one injection every 2 wekks per urologist 19)  Moviprep 100 Gm Solr (Peg-kcl-nacl-nasulf-na asc-c) .... As per prep instructions. 20)  Flexeril 10 Mg Tabs (Cyclobenzaprine hcl) .... 1/2 -1 tab by mouth three times a day  Patient Instructions: 1)  Stop Pravastatin.  2)  Start CoEnzyme Q 10 )(Co Q 10) in 2 weeks. 3)  Then start back on Prava 10 2 weeks later.  4)  Stop HCTZ  now and monitor BP.  Prescriptions: FLEXERIL 10 MG TABS (CYCLOBENZAPRINE HCL) 1/2 -1 tab by mouth  three times a day  #30 x 0   Entered and Authorized by:   Shaune Leeks MD   Signed by:   Shaune Leeks MD on 04/08/2010   Method used:   Print then Give to Patient   RxID:   [id number]   Current Allergies (reviewed today): ! [facility]

## 2010-12-25 NOTE — Letter (Signed)
Summary: Dr.John [facility],Note  Dr.John Wrenn,[facility],Note   Imported By: Beau Fanny 05/13/2010 13:51:22  _____________________________________________________________________  External Attachment:    Type:   Image     Comment:   External Document

## 2010-12-25 NOTE — Miscellaneous (Signed)
Summary: [facility] Order for Supplies  [facility] Order for Supplies   Imported By: Beau Fanny 04/01/2010 15:29:55  _____________________________________________________________________  External Attachment:    Type:   Image     Comment:   External Document

## 2010-12-25 NOTE — Letter (Signed)
Summary: Patient Notice- Polyp Results  [facility]  [address] [place], [place] [address]   Phone: [phone]  Fax: [phone]        Apr 17, 2010 MRN: [id number]    [facility] [address] [place], [place]  [address]    Dear Mr. Pardini,  The polyps removed from your colon were adenomatous. This means that they were pre-cancerous or that  they had the potential to change into cancer over time.   I recommend that you have a repeat colonoscopy in 3 years to determine if you have developed any new polyps over time. If you develop any new rectal bleeding, abdominal pain or significant bowel habit changes, please contact us before then.  Please call us if you are having persistent problems or have questions about your condition that have not been fully answered at this time.   Sincerely,  Iva Boop MD, [facility]  This letter has been electronically signed by your physician.  Appended Document: Patient Notice- Polyp Results letter mailed.

## 2010-12-25 NOTE — Letter (Signed)
Summary: Nadara Eaton letter  [place] at [facility]  [address] [place], [place] [address]   Phone: [phone]  Fax: [phone]       06/30/2010 MRN: [id number]  [facility] [address] [place], [place]  [address]  Dear Mr. [facility] [place], and [place] announce the retirement of Arta Silence, M.D., from full-time practice at the [facility] office effective May 22, 2010 and his plans of returning part-time.  It is important to Dr. Hetty Ely and to our practice that you understand that [facility] has seven physicians in our office for your health care needs.  We will continue to offer the same exceptional care that you have today.    Dr. Hetty Ely has spoken to many of you about his plans for retirement and returning part-time in the fall.   We will continue to work with you through the transition to schedule appointments for you in the office and meet the high standards that [facility] is committed to.   Again, it is with great pleasure that we share the news that Dr. Hetty Ely will return to [facility] at [facility] in October of 2011 with a reduced schedule.    If you have any questions, or would like to request an appointment with one of our physicians, please call us at [phone] and press the option for Scheduling an appointment.  We take pleasure in providing you with excellent patient care and look forward to seeing you at your next office visit.  Our [facility] Physicians are:  Tillman Abide, M.D. Laurita Quint, M.D. Roxy Manns, M.D. Kerby Nora, M.D. Hannah Beat, M.D. Ruthe Mannan, M.D. We proudly welcomed Raechel Ache, M.D. and Eustaquio Boyden, M.D. to the practice in July/August 2011.  Sincerely,  [facility]

## 2010-12-25 NOTE — Procedures (Signed)
Summary: Colonoscopy  Patient: Christopher Santiago Note: All result statuses are Final unless otherwise noted.  Tests: (1) Colonoscopy (COL)   COL Colonoscopy           DONE     [facility]     [address]     [place], [place]  [address]           COLONOSCOPY PROCEDURE REPORT           PATIENT:  Christopher Santiago, Christopher Santiago  MR#:  [id number]     BIRTHDATE:  1931/02/25, 79 yrs. old  GENDER:  male     ENDOSCOPIST:  Iva Boop, MD, [facility]           PROCEDURE DATE:  04/10/2010     PROCEDURE:  Colonoscopy with biopsy and snare polypectomy     ASA CLASS:  Class III     INDICATIONS:  surveillance and high-risk screening, history of     pre-cancerous (adenomatous) colon polyps 2008: 3 adenomas removed,     2 > 1c     2009 residual hepatic flexure adenoma vs. new, biopsied, ?     removed     for closer surveillance/screening than usual due to this     MEDICATIONS:   Fentanyl 50 mcg IV, Versed 5 mg IV           DESCRIPTION OF PROCEDURE:   After the risks benefits and     alternatives of the procedure were thoroughly explained, informed     consent was obtained.  Digital rectal exam was performed and     revealed no abnormalities and normal prostate.   The LB CF-[id number]     [id number] endoscope was introduced through the anus and advanced to     the cecum, which was identified by both the appendix and ileocecal     valve, without limitations.  The quality of the prep was     excellent, using MoviPrep.  The instrument was then slowly     withdrawn as the colon was fully examined.     Insertion: 4:35 minutes Withdrawal: 14:47 minutes     <<PROCEDUREIMAGES>>           FINDINGS:  A sessile polyp was found in the cecum. It was 1 cm in     size. Polyp was snared, then cauterized with monopolar cautery.     Retrieval was successful. A diminutive polyp was found in the     cecum. It was 2 mm in size. The polyp was removed using cold     biopsy forceps.  This was otherwise a normal examination of the   colon.  Moderate diverticulosis was found in the sigmoid colon.     Retroflexed views in the rectum revealed no abnormalities.    The     scope was then withdrawn from the patient and the procedure     completed.           COMPLICATIONS:  None     ENDOSCOPIC IMPRESSION:     1) 1 cm sessile polyp in the cecum - removed     2) 2 mm diminutive polyp in the cecum - removed     3) Moderate diverticulosis in the sigmoid colon     4) Otherwise normal examination           5) Personal history of adenomas 2008 and 2009           RECOMMENDATIONS:     1) No  aspirin or NSAID's for 2 weeks     Hold Aspirin and indomethacin           REPEAT EXAM:  In for Colonoscopy, pending biopsy results.           Iva Boop, MD, [facility]           CC:  The Patient           n.     eSIGNED:   Iva Boop at 04/10/2010 11:55 AM           Tressia Miners, [id number]  Note: An exclamation mark (!) indicates a result that was not dispersed into the flowsheet. Document Creation Date: 04/10/2010 5:07 PM _______________________________________________________________________  (1) Order result status: Final Collection or observation date-time: 04/10/2010 11:43 Requested date-time:  Receipt date-time:  Reported date-time:  Referring Physician:   Ordering Physician: Stan Head [phone]) Specimen Source:  Source: Launa Grill Order Number: [id number] Lab site:   Appended Document: Colonoscopy     Procedures Next Due Date:    Colonoscopy: 04/2013
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
## 2010-12-25 NOTE — Miscellaneous (Signed)
Summary: [facility] Order for Supplies  [facility] Order for Supplies   Imported By: Beau Fanny 03/26/2010 15:21:05  _____________________________________________________________________  External Attachment:    Type:   Image     Comment:   External Document

## 2010-12-25 NOTE — Progress Notes (Signed)
Summary: Diabetes supplies compliance form  Phone Note From Pharmacy Call back at fax [phone]   Caller: [facility] Call For: Dr. Hetty Ely  Summary of Call: Received fax from pharmacy stating that compliance is required regarding diabetes testing supplies.  Form in your IN box.  Please advise. Initial call taken by: Linde Gillis CMA Duncan Dull),  March 03, 2010 2:50 PM  Follow-up for Phone Call        Please have pharmacy request diary from pt. I prefer not to take staff tiime to give info the pt can do instead. A 30 day testing log will fill this requirement. Follow-up by: Shaune Leeks MD,  March 03, 2010 5:02 PM  Additional Follow-up for Phone Call Additional follow up Details #1::        Faxed over last 3 office notes and fax form back to [facility] [phone]. Additional Follow-up by: Linde Gillis CMA Duncan Dull),  March 04, 2010 8:26 AM
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
## 2010-12-25 NOTE — Letter (Signed)
Summary: Previsit letter  [facility]  [address] [place], [place] [address]   Phone: [phone]  Fax: [phone]       03/21/2010 MRN: [id number]  [facility] [address] [place], [place]  [address]  Dear Christopher Santiago,  Welcome to the Gastroenterology Division at [facility].    You are scheduled to see a nurse for your pre-procedure visit on 04/04/2010 at 10:30am on the 3rd floor at [facility], [address]  We ask that you try to arrive at our office 15 minutes prior to your appointment time to allow for check-in.  Your nurse visit will consist of discussing your medical and surgical history, your immediate family medical history, and your medications.    Please bring a complete list of all your medications or, if you prefer, bring the medication bottles and we will list them.  We will need to be aware of both prescribed and over the counter drugs.  We will need to know exact dosage information as well.  If you are on blood thinners (Coumadin, Plavix, Aggrenox, Ticlid, etc.) please call our office today/prior to your appointment, as we need to consult with your physician about holding your medication.   Please be prepared to read and sign documents such as consent forms, a financial agreement, and acknowledgement forms.  If necessary, and with your consent, a friend or relative is welcome to sit-in on the nurse visit with you.  Please bring your insurance card so that we may make a copy of it.  If your insurance requires a referral to see a specialist, please bring your referral form from your primary care physician.  No co-pay is required for this nurse visit.     If you cannot keep your appointment, please call [phone] to cancel or reschedule prior to your appointment date.  This allows [place] the opportunity to schedule an appointment for another patient in need of care.    Thank you for choosing [facility] for your medical  needs.  We appreciate the opportunity to care for you.  Please visit [place] at our website  to learn more about our practice.                     Sincerely.                                                                                                                   The Gastroenterology Division

## 2010-12-25 NOTE — Letter (Signed)
Summary: Colonoscopy Letter  [facility]  [address] [place], [place] [address]   Phone: [phone]  Fax: [phone]      March 17, 2010 MRN: [id number]   [facility] [address] [place], [place]  [address]   Dear Mr. Bentsen,   According to your medical record, it is time for you to schedule a Colonoscopy. The American Cancer Society recommends this procedure as a method to detect early colon cancer. Patients with a family history of colon cancer, or a personal history of colon polyps or inflammatory bowel disease are at increased risk.  This letter has beeen generated based on the recommendations made at the time of your procedure. If you feel that in your particular situation this may no longer apply, please contact our office.  Please call our office at [phone] to schedule this appointment or to update your records at your earliest convenience.  Thank you for cooperating with [place] to provide you with the very best care possible.   Sincerely,  Iva Boop, M.D.  [facility] Gastroenterology Division [phone]
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
## 2011-01-20 ENCOUNTER — Telehealth (INDEPENDENT_AMBULATORY_CARE_PROVIDER_SITE_OTHER): Payer: Self-pay | Admitting: *Deleted

## 2011-01-22 ENCOUNTER — Other Ambulatory Visit: Payer: Self-pay

## 2011-01-23 ENCOUNTER — Other Ambulatory Visit: Payer: Self-pay | Admitting: Family Medicine

## 2011-01-23 ENCOUNTER — Encounter (INDEPENDENT_AMBULATORY_CARE_PROVIDER_SITE_OTHER): Payer: Self-pay | Admitting: *Deleted

## 2011-01-23 ENCOUNTER — Other Ambulatory Visit (INDEPENDENT_AMBULATORY_CARE_PROVIDER_SITE_OTHER): Payer: Medicare Other

## 2011-01-23 DIAGNOSIS — E119 Type 2 diabetes mellitus without complications: Secondary | ICD-10-CM

## 2011-01-23 DIAGNOSIS — M109 Gout, unspecified: Secondary | ICD-10-CM

## 2011-01-23 DIAGNOSIS — E7800 Pure hypercholesterolemia, unspecified: Secondary | ICD-10-CM

## 2011-01-23 DIAGNOSIS — E038 Other specified hypothyroidism: Secondary | ICD-10-CM

## 2011-01-23 LAB — HEPATIC FUNCTION PANEL
ALT: 15 U/L (ref 0–53)
Albumin: 3.7 g/dL (ref 3.5–5.2)
Total Bilirubin: 0.8 mg/dL (ref 0.3–1.2)

## 2011-01-23 LAB — HEMOGLOBIN A1C: Hgb A1c MFr Bld: 5.5 % (ref 4.6–6.5)

## 2011-01-23 LAB — LIPID PANEL
Cholesterol: 152 mg/dL (ref 0–200)
LDL Cholesterol: 79 mg/dL (ref 0–99)
Triglycerides: 59 mg/dL (ref 0.0–149.0)

## 2011-01-23 LAB — BASIC METABOLIC PANEL
BUN: 16 mg/dL (ref 6–23)
Chloride: 108 mEq/L (ref 96–112)
Creatinine, Ser: 0.9 mg/dL (ref 0.4–1.5)

## 2011-01-29 ENCOUNTER — Encounter: Payer: Self-pay | Admitting: Family Medicine

## 2011-01-29 ENCOUNTER — Encounter (INDEPENDENT_AMBULATORY_CARE_PROVIDER_SITE_OTHER): Payer: Medicare Other | Admitting: Family Medicine

## 2011-01-29 DIAGNOSIS — E038 Other specified hypothyroidism: Secondary | ICD-10-CM

## 2011-01-29 DIAGNOSIS — J310 Chronic rhinitis: Secondary | ICD-10-CM | POA: Insufficient documentation

## 2011-01-29 DIAGNOSIS — I10 Essential (primary) hypertension: Secondary | ICD-10-CM

## 2011-01-29 DIAGNOSIS — E119 Type 2 diabetes mellitus without complications: Secondary | ICD-10-CM

## 2011-01-29 DIAGNOSIS — E7800 Pure hypercholesterolemia, unspecified: Secondary | ICD-10-CM

## 2011-01-29 NOTE — Progress Notes (Signed)
----   Converted from flag ---- ---- 01/19/2011 11:31 AM, Crawford Givens MD wrote: cmet/lipid/A1c    250.00 tsh      244.8 uric acid    274.9  thanks  ---- 01/19/2011 10:19 AM, Liane Comber CMA (AAMA) wrote: Lab orders please! Good Morning! This pt is scheduled for cpx labs [place], which labs to draw and dx codes to use? Thanks Tasha ------------------------------
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
## 2011-02-03 NOTE — Letter (Signed)
Summary: [facility]: Immunoassay Fecal Occult Blood (iFOB) Order Form  [facility]  [address] [place], [place] [address]   Phone: [phone]  Fax: [phone]      [facility]: Immunoassay Fecal Occult Blood (iFOB) Order Form   January 29, 2011 MRN: [id number]   JACUB WAITERS 1931-05-08   Physicican Name:______[email or web address]________________  Diagnosis Code:_______v76.49___________________      Crawford Givens MD

## 2011-02-09 LAB — GLUCOSE, CAPILLARY
Glucose-Capillary: 81 mg/dL (ref 70–99)
Glucose-Capillary: 99 mg/dL (ref 70–99)

## 2011-02-12 ENCOUNTER — Other Ambulatory Visit: Payer: Self-pay | Admitting: Family Medicine

## 2011-02-12 MED ORDER — ALLOPURINOL 100 MG PO TABS: 200.0000 mg | ORAL_TABLET | Freq: Every day | ORAL | Status: DC

## 2011-02-12 NOTE — Telephone Encounter (Signed)
Christopher Santiago, I think this guy is now your pt. If not let me know. I'll be in tomorrow to handle this if needed.

## 2011-02-12 NOTE — Telephone Encounter (Signed)
Think this is your pt. If not, send it back.

## 2011-02-17 ENCOUNTER — Other Ambulatory Visit: Payer: Medicare Other

## 2011-02-17 ENCOUNTER — Other Ambulatory Visit (INDEPENDENT_AMBULATORY_CARE_PROVIDER_SITE_OTHER): Payer: Medicare Other | Admitting: Family Medicine

## 2011-02-17 DIAGNOSIS — Z1211 Encounter for screening for malignant neoplasm of colon: Secondary | ICD-10-CM

## 2011-02-17 LAB — FECAL OCCULT BLOOD, IMMUNOCHEMICAL: Fecal Occult Bld: NEGATIVE

## 2011-02-18 ENCOUNTER — Encounter: Payer: Self-pay | Admitting: *Deleted

## 2011-02-26 LAB — DIFFERENTIAL
Basophils Relative: 1 % (ref 0–1)
Eosinophils Relative: 1 % (ref 0–5)
Lymphs Abs: 0.5 10*3/uL — ABNORMAL LOW (ref 0.7–4.0)
Monocytes Relative: 10 % (ref 3–12)
Smear Review: DECREASED

## 2011-02-26 LAB — GLUCOSE, CAPILLARY
Glucose-Capillary: 103 mg/dL — ABNORMAL HIGH (ref 70–99)
Glucose-Capillary: 107 mg/dL — ABNORMAL HIGH (ref 70–99)
Glucose-Capillary: 112 mg/dL — ABNORMAL HIGH (ref 70–99)
Glucose-Capillary: 114 mg/dL — ABNORMAL HIGH (ref 70–99)
Glucose-Capillary: 117 mg/dL — ABNORMAL HIGH (ref 70–99)
Glucose-Capillary: 119 mg/dL — ABNORMAL HIGH (ref 70–99)
Glucose-Capillary: 127 mg/dL — ABNORMAL HIGH (ref 70–99)
Glucose-Capillary: 79 mg/dL (ref 70–99)
Glucose-Capillary: 86 mg/dL (ref 70–99)
Glucose-Capillary: 89 mg/dL (ref 70–99)
Glucose-Capillary: 89 mg/dL (ref 70–99)
Glucose-Capillary: 95 mg/dL (ref 70–99)
Glucose-Capillary: 97 mg/dL (ref 70–99)
Glucose-Capillary: 99 mg/dL (ref 70–99)

## 2011-02-26 LAB — URINALYSIS, ROUTINE W REFLEX MICROSCOPIC
Glucose, UA: NEGATIVE mg/dL
Ketones, ur: NEGATIVE mg/dL
Leukocytes, UA: NEGATIVE
Nitrite: NEGATIVE
Nitrite: NEGATIVE
Specific Gravity, Urine: 1.023 (ref 1.005–1.030)
Urobilinogen, UA: 0.2 mg/dL (ref 0.0–1.0)
pH: 7 (ref 5.0–8.0)
pH: 8.5 — ABNORMAL HIGH (ref 5.0–8.0)

## 2011-02-26 LAB — CBC
Hemoglobin: 12.8 g/dL — ABNORMAL LOW (ref 13.0–17.0)
MCHC: 33.9 g/dL (ref 30.0–36.0)
Platelets: DECREASED 10*3/uL (ref 150–400)
RDW: 15.4 % (ref 11.5–15.5)

## 2011-02-26 LAB — BASIC METABOLIC PANEL
BUN: 15 mg/dL (ref 6–23)
CO2: 25 mEq/L (ref 19–32)
Calcium: 9.1 mg/dL (ref 8.4–10.5)
Creatinine, Ser: 0.86 mg/dL (ref 0.4–1.5)
GFR calc non Af Amer: >60 mL/min (ref >60)
Glucose, Bld: 120 mg/dL — ABNORMAL HIGH (ref 70–99)

## 2011-02-26 LAB — COMPREHENSIVE METABOLIC PANEL
ALT: 12 U/L (ref 0–53)
AST: 26 U/L (ref 0–37)
Alkaline Phosphatase: 28 U/L — ABNORMAL LOW (ref 39–117)
CO2: 30 mEq/L (ref 19–32)
Calcium: 9.3 mg/dL (ref 8.4–10.5)
GFR calc Af Amer: >60 mL/min (ref >60)
Potassium: 4.2 mEq/L (ref 3.5–5.1)
Sodium: 137 mEq/L (ref 135–145)
Total Protein: 6.9 g/dL (ref 6.0–8.3)

## 2011-02-26 LAB — TYPE AND SCREEN
ABO/RH(D): O POS
Antibody Screen: NEGATIVE

## 2011-02-26 LAB — POCT CARDIAC MARKERS
Myoglobin, poc: 134 ng/mL (ref 12–200)
Troponin i, poc: <0.05 ng/mL (ref 0.00–0.09)

## 2011-02-26 LAB — PROTIME-INR
INR: 0.96 (ref 0.00–1.49)
INR: 1.51 — ABNORMAL HIGH (ref 0.00–1.49)
Prothrombin Time: 18.1 seconds — ABNORMAL HIGH (ref 11.6–15.2)

## 2011-02-26 LAB — URINE MICROSCOPIC-ADD ON

## 2011-04-07 NOTE — Letter (Signed)
December 02, 2007    Arta Silence, MD  [address] [place], [place] [address]   RE:  Christopher, Santiago  MRN:  [id number]  /  DOB:  05-Aug-1931   Dear Dr. Hetty Ely:   It was my pleasure to evaluate Christopher Santiago for lower extremity  peripheral arterial disease.  He was seen as an outpatient on November 30, 2007.   Christopher Santiago is a 78 year old gentleman with nonobstructive coronary  artery disease, former heavy tobacco use, hypertension, dyslipidemia and  diabetes, who presented for evaluation of iliac calcification and an  abnormal pulse exam.  During an evaluation for back pain by Dr. Shelle Iron,  he had calcification in his iliac vessels as well as popliteal vessels  seen on plain radiography.  In the setting of an abnormal pulse exam  with these findings, he was referred for evaluation.  Christopher Santiago has had  a lot of difficulty with osteoarthritis in his back and knee pain.  He  has actually been able to remain quite active.  He has done a lot of  bicycling and used to bike anywhere between 25 and 60 miles at a time.  He also enjoys kayaking.  He is not done much walking or exercise  recently because his wife has recently undergone hip replacement.  However prior to this, he was able to walk for up to 4 miles without  stopping.  He denies thigh or calf claudication symptoms.  He has not  had any ischemic ulcerations or problems with nonhealing wounds.  He has  had no prior history of stroke or TIA.   PAST MEDICAL HISTORY:  1. Coronary artery disease, managed medically.  2. Hypertension.  3. Dyslipidemia.  4. Type 2 diabetes.  5. History of vocal cord cancer.  6. Osteoarthritis.  7. COPD.   SOCIAL HISTORY:  The patient is retired.  He does a lot of traveling.  He is married with 5 children.  He is a former smoker, but quit in 2000.   FAMILY HISTORY:  He has 6 siblings none of whom have had cardiac  problems.  His father's history is unknown.  His mother died at age  35.   REVIEW OF SYSTEMS:  A complete 12-point review of systems was performed.  Pertinent positives included exertional dyspnea, erectile dysfunction,  depression and gout.  All other systems were reviewed are negative  except as detailed above.   MEDICATIONS:  1. Avandia 8 mg daily.  2. Metformin 500 mg b.i.d.  3. Rhinocort 32 mcg daily.  4. Aspirin 81 mg.  5. Spiriva.  6. Hydrochlorothiazide 25 mg daily.  7. Norvasc 10 mg daily.  8. Allopurinol 100 mg daily.  9. Citalopram 30 mg daily.  10.Diovan 160 mg daily.  11.Pravastatin 10 mg daily.   ALLERGIES:  NKDA.   PHYSICAL EXAMINATION:  GENERAL:  The patient is alert and oriented.  He  is in no acute distress.  He is a well-appearing male.  VITAL SIGNS:  Weight is 237, blood pressure 136/58 in the right, 122/50  in the left, heart rate 73, respiratory rate 16.  HEENT:  Normal.  NECK:  Normal carotid upstrokes without bruits.  Jugular venous pressure  is normal.  LUNGS:  Clear to auscultation bilaterally.  HEART:  Regular rate and rhythm without murmurs or gallops.  BACK:  No CVA tenderness.  ABDOMEN:  Soft, nontender, no organomegaly.  There is an abdominal bruit  heard only with expiration.  Bowel  sounds are present.  EXTREMITIES:  There is no clubbing, cyanosis or edema.  On the right,  the femoral and dorsalis pedis pulses are 2+.  Posterior tibial pulse is  trace.  On the left, femoral pulse is 2+ with a soft bruit.  The  posterior tibials 1+ and dorsalis pedis is trace.  SKIN:  There is no ulceration or skin rash.  NEUROLOGIC:  Grossly intact.  Strength is 5/5 and equal in the arms and  legs.  Cranial nerves II-XII are intact.  LYMPHATICS:  There is no adenopathy.   ASSESSMENT:  Christopher Santiago is a 77 year old gentleman with probable  nonobstructive lower extremity peripheral arterial disease.  He really  does not have any limitation from claudication symptoms.  He does have  an abnormal pulse exam in his feet and  radiographic findings suggestive  of at least some degree of lower extremity atherosclerosis.  With his  longstanding smoking history and abdominal bruit, I think it would be  reasonable to perform an abdominal ultrasound to rule out aortic  aneurysm.  I would also like to check an ABI test to establish a  baseline ABI in the setting of his abnormal pulse exam.   We will be in contact with Christopher Santiago after his test results are  available.  I will plan on seeing him back on an as needed basis.  He is  on appropriate secondary risk reduction for his peripheral and coronary  artery disease.   Thanks again for asking me to evaluate this very nice gentleman.  Please  feel free to call at any time with questions regarding his care.    Sincerely,      Veverly Fells. Excell Seltzer, MD  Electronically Signed    MDC/MedQ  DD: 12/02/2007  DT: 12/02/2007  Job #: [id number]   CC:   Jene Every, M.D.  Bevelyn Buckles. Bensimhon, MD

## 2011-04-07 NOTE — Assessment & Plan Note (Signed)
[facility]                            CARDIOLOGY OFFICE NOTE   TOSHIO, SLUSHER                       MRN:          [id number]  DATE:06/06/2008                            DOB:          05-18-1931    HISTORY OF PRESENT ILLNESS:  This is the patient of Dr. Gala Romney.  Mr.  Christopher Santiago is a very pleasant 77 year old white male with coronary artery  disease with a 70-80% lesion in the marginal branch, hypertension,  hyperlipidemia, diabetes, and COPD.  He is here for a yearly followup.  The patient also saw Dr. Excell Seltzer earlier this year because of lower  extremity peripheral arterial disease.  ABIs showed, left superficial  femoral artery occlusion with reconstitution in the distal one-third of  the thigh.  He had heavily calcified plaque throughout the lower  extremities bilaterally.  No segmental stenosis on the right.  Right ABI  is normal range and left ABI, mild range; and abdominal aortic  ultrasound showed minimal dilatation of distal abdominal aorta.  There  was mild aortoiliac arterial atherosclerosis without stenosis.   The patient has been doing quite well.  He rides a bike 35-40 miles a  day when he rides without any symptoms.  He actually climbed a hill this  morning hiking without any problems.  He is traveling all over and has  gone for months at a time.  He denies any chest pain, palpitations,  dizziness, or presyncope.  He is not having any claudication symptoms.   CURRENT MEDICATIONS:  1. Avandia 8 mg daily.  2. Metformin 500 mg b.i.d.  3. Rhinocort 32 mcg daily.  4. Aspirin 81 mg daily.  5. Spiriva daily.  6. Hydrochlorothiazide 25 mg daily.  7. Norvasc 10 mg daily.  8. Allopurinol 100 mg 2 daily.  9. Citalopram 30 mg daily.  10.Diovan 160 mg daily.  11.Pravastatin 10 mg daily.   PHYSICAL EXAMINATION:  GENERAL:  This is a very pleasant 77 year old  white male, in no acute distress.  VITAL SIGNS:  Blood pressure 113/64, pulse 76,  and weight 236.  NECK:  Without JVD, HJR, bruit, or thyroid enlargement.  LUNGS:  Decreased breath sounds without wheezing, rales, or rhonchi.  HEART:  Regular rate and rhythm at 75 beats per minute.  Normal S1 and  S2.  No significant murmur, rub, bruit, thrill, or heave noted.  He has  distant heart sounds.  ABDOMEN:  Obese.  Normoactive bowel sounds are heard throughout.  No  organomegaly, masses, lesions, or abnormal tenderness.  EXTREMITIES:  Without cyanosis or clubbing.  He has a trace of edema.  I cannot feel  his dorsalis pedis or posterior tibial pulses.   EKG, normal sinus rhythm with right bundle-branch block unchanged from  prior tracings.   IMPRESSION:  1. Coronary artery disease with 75-80% marginal branch on      catheterization in May 28, 2008, with scattered disease elsewhere,      asymptomatic.  2. Chronic obstructive pulmonary disease.  3. Hypertension.  4. Hyperlipidemia.  5. Diabetes mellitus.  6. Chronic right bundle-branch block.  7. Peripheral  arterial disease.   PLAN:  The patient is currently stable.  He recently had blood work a  couple of weeks ago by Dr. Hetty Ely including lipid profile.  We did not  have these results, but we will try to obtain them.  The patient will  see Dr. Gala Romney back in [place] in 6 months or sooner if needed.      Jacolyn Reedy, PA-C  Electronically Signed      Jesse Sans. Daleen Squibb, MD, [place]  Electronically Signed   ML/MedQ  DD: 06/06/2008  DT: 06/07/2008  Job #: [id number]

## 2011-04-07 NOTE — Assessment & Plan Note (Signed)
[facility] OFFICE NOTE   Christopher Santiago, Christopher Santiago                       MRN:          [id number]  DATE:12/06/2008                            DOB:          23-Feb-1931    PRIMARY CARE PHYSICIAN:  Arta Silence, MD   INTERVAL HISTORY:  Christopher Santiago is a delightful 77 year old man with a history  of coronary artery disease with 70-80% lesion in the marginal branch,  hypertension, hyperlipidemia, diabetes, and COPD.  He also has  peripheral vascular disease.  He returns today for routine followup.   Overall, he is doing fairly well.  He has had some depression lately  over his wife's orthopedic problems with her back surgery and hip  replacements, but lately, he said he has been doing much better.  He is  exercising every other day, walking on the treadmill.  Last week, he  walked for over an hour 1 day.  He typically walks three miles an hour  and walks between one and two miles a day.  He does not have  claudication.  No chest pain.  He does have stable exertional dyspnea.  He has not been having any heart failure symptoms.  He does have  problems with back pain.   REVIEW OF SYSTEMS:  As per interval history and problem list, otherwise  all systems negative.   CURRENT MEDICATIONS:  1. Allopurinol 200 mg a day.  2. Albuterol p.r.n.  3. Amlodipine 10 mg a day.  4. Aspirin 81 a day.  5. Avandia 8 mg a day.  6. Citalopram.  7. Wellbutrin.  8. Diovan 160 a day.  9. Indomethacin p.r.n.  10.Hydrochlorothiazide 25 a day.  11.Metformin 500 b.i.d.  12.Pravastatin 10 a day.  13.Proventil inhaler.  14.Rhinocort.  15.Spiriva.  16.Viagra p.r.n.  17.Stool softener p.r.n.   PHYSICAL EXAMINATION:  GENERAL:  He is in no acute distress.  Ambulates  around the clinic without any respiratory difficulty.  VITAL SIGNS:  Blood pressure is 104/56, heart rate 66, weight 232.  HEENT:  Normal.  NECK:  Supple.  No JVD.  Carotids are  2+ bilaterally without any bruits.  There is no lymphadenopathy or thyromegaly.  CARDIAC:  PMI is nondisplaced.  He is regular with distant heart sounds.  No murmurs.  LUNGS:  Decreased air movement throughout.  No wheezing.  ABDOMEN:  Obese, nontender, nondistended.  No hepatosplenomegaly.  No  bruits.  No masses.  Good bowel sounds.  EXTREMITIES:  Warm with no  cyanosis or clubbing.  There is trace edema.  No rash.  NEUROLOGIC:  Alert and oriented x3.  Cranial nerves II through XII are  intact.  Moves all four extremities without difficulty.  Affect is  pleasant.   EKG shows normal sinus rhythm with a right bundle branch block which is  chronic.  No ST-T wave abnormalities.   ASSESSMENT AND PLAN:  1. Coronary artery disease, this is stable.  No evidence of ischemia.      Continue current therapy.  I congratulated him on his exercise      program.  2. Hypertension, well controlled.  3. Hyperlipidemia.  Goal LDL is less than 70.  This is followed by Dr.      Hetty Ely.  4. Dyspnea.  This is chronic mostly related to his chronic obstructive      pulmonary disease  5. He is interested in stopping his Spiriva.  I said he could try      stopping this and if he was more short of breath, he could restart      it.   We will see him back in 6 months for routine followup.  I told him to  call me if he is having problems.     Bevelyn Buckles. Bensimhon, MD  Electronically Signed    DRB/MedQ  DD: 12/06/2008  DT: 12/07/2008  Job #: [id number]   cc:   Arta Silence, MD

## 2011-04-07 NOTE — Assessment & Plan Note (Signed)
[facility]                            CARDIOLOGY OFFICE NOTE   EMANUELLE, BASTOS                       MRN:          [id number]  DATE:06/14/2007                            DOB:          May 12, 1931    INTERVAL HISTORY:  Mr. Figgs is a delightful, 77 year old male with a  history of chronic dyspnea, COPD, coronary artery disease with a 75-80%  lesion and a marginal branch, hypertension, hyperlipidemia, diabetes and  a chronic right bundle branch block. He returns for routine followup.   Overall he is doing fairly well, he continues to ride his bike and just  has taken up kayaking. He denies any chest pain but he does feel that  his shortness of breath may be getting a bit worse. He feels like he is  losing it just slowly. He does follow with Dr. Delton Coombes for his pulmonary  disease as well as his sleep apnea and has been compliant with his CPAP.   He has had some recent lower extremity edema which he attributed to his  Indocin that he was taking for his gout. No orthopnea or PND. He stopped  his Crestor due to myalgias.   CURRENT MEDICATIONS:  1. Avandia 8 mg a day.  2. Metformin 500 b.i.d.  3. Rhinocort.  4. Aspirin 81.  5. Spiriva.  6. HCTZ 25.  7. Norvasc 10.  8. Citalopram 40.  9. Diovan 80.   PHYSICAL EXAMINATION:  GENERAL:  He is well-appearing in no acute  distress. He ambulates around the clinic without any respiratory  difficulty.  VITAL SIGNS:  Blood pressure is 130/60, heart rate is 82, weight is 241.  HEENT:  Normal.  NECK:  Supple. There is no JVD. Carotids are 2+ bilaterally without any  bruits. There is no lymphadenopathy or thyromegaly.  CARDIAC:  His PMI is nondisplaced. He is has distant heart sounds.  Regular rate and rhythm with a soft S4. No murmurs.  LUNGS:  Decreased air movement with a prolonged expiratory phase, no  wheezes or rales.  ABDOMEN:  Obese, nontender, nondistended. No hepatosplenomegaly, no  bruits, no  masses.  EXTREMITIES:  Warm with no cyanosis or edema. There is trace edema. No  rash.  NEUROLOGIC:  His affect is bright. Cranial nerves II-XII are intact.  Moves all 4 extremities without difficulty. He is alert and oriented x3.   ASSESSMENT/PLAN:  1. Dyspnea. I suspect this is multifactorial and probably due mostly      to his chronic obstructive pulmonary disease and obesity. However      he does have a borderline lesion in his left circumflex system and      if his symptoms continue to get worse this may be worth a relook.  2. Coronary artery disease. Currently stable. I emphasized to him the      importance of taking a statin drug and we will try to get him back      on one. We will try low dose Pravastatin at 10 mg a day.  3. Hypertension. Blood pressure is just minimally elevated. Increase  his Diovan to 160 a day and check a potassium next week.  4. Hyperlipidemia. Goal LDL is 70. Will check his lipids next week.  5. Chronic obstructive pulmonary disease per Dr. Delton Coombes. I appreciate      his input.   DISPOSITION:  Will see him back in 4 months just to see how he is doing.  I told him if his dyspnea gets progressively worse he should contact me  sooner.     Bevelyn Buckles. Bensimhon, MD  Electronically Signed    DRB/MedQ  DD: 06/14/2007  DT: 06/15/2007  Job #: [id number]

## 2011-04-10 NOTE — Op Note (Signed)
. [facility]  Patient:    CELESTER, LECH                       MRN: [id number] Proc. Date: 04/08/01 Adm. Date:  [id number] Disc. Date: [id number] Attending:  Carlean Purl CC:         Laurita Quint, M.D. 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  OPERATION:  Microlaryngoscopy with CO2 laser excision of right true vocal cord squamous cell carcinoma.  SURGEON:  Kristine Garbe. Ezzard Standing, M.D.  ANESTHESIA:  General endotracheal.  COMPLICATIONS:  None.  BRIEF CLINICAL NOTE:  The patient is a 77 year old gentleman who has had hoarseness for the last couple of months.  He underwent a direct laryngoscopy and biopsy two weeks ago which demonstrated a squamous cell carcinoma of the right anterior true vocal cord.  He is taken back to the operating room at this time for CO2 laser excision of right anterior true vocal cord for treatment of his T1 squamous cell carcinoma.  DESCRIPTION OF PROCEDURE:  After adequate endotracheal anesthesia with a laser endotracheal tube, eyes were protected with wet sponges and direct laryngoscopy was performed.  The cords were visualized and the laryngoscope was extended.  Wet towels were placed on the face.  Then, using CO2 laser set at 10 watts, the anterior one-third of the right true vocal cord was excised where the previous biopsy was performed.  This completed the procedure.  The specimen was sent to pathology.  The patient was awakened from anesthesia and transferred to the recovery room postoperative doing well.  DISPOSITION:  The patient is discharged home later this morning on Tylenol with Tylenol No. 3 p.r.n. pain.  Will keep him on antacid therapy, Prevacid 30 mg a day for the next three weeks.  Will have him follow up in my office in two to  three weeks for recheck. DD:  04/08/01 TD:  04/10/01 Job: [id number]

## 2011-04-10 NOTE — Cardiovascular Report (Signed)
Christopher Santiago, Christopher Santiago NO.:  [id number]   MEDICAL RECORD NO.:  [id number]          PATIENT TYPE:  OIB   LOCATION:  NA                           FACILITY:  [facility]   PHYSICIAN:  Arvilla Meres, M.D. LHCDATE OF BIRTH:  1931/02/27   DATE OF PROCEDURE:  09/24/2005  DATE OF DISCHARGE:                              CARDIAC CATHETERIZATION   IDENTIFICATION:  Christopher Santiago is a delightful 77 year old male with a history  of hypertension and hyperlipidemia, who has previously been an avid cyclist.  He presented to my office recently, complaining of progressive exertional  dyspnea and also easy fatigability. Cardiolite was done, which showed an EF  of 59% with some question of some anterior ischemia. He is thus referred for  right and left heart catheterization.   PROCEDURES PERFORMED:  1.  Selective coronary angiography.  2.  Left heart catheterization.  3.  Left ventriculogram.  4.  Right heart catheterization with Fick cardiac output.   DESCRIPTION OF PROCEDURE:  The risks and benefits of the catheterization  were explained to Christopher Santiago; consent was signed and placed on the chart. A  4-French arterial sheath was placed in the right femoral artery, using a  modified Seldinger technique. Preformed JL-4, Champ catheter and bent  pigtail were used for the procedure. All catheter exchanges were made over a  wire. There were no apparent complications. Of note, we were not able to  easily intubate the RCA with a standard JR-4, and thus a Champ was used as  the takeoff of the right coronary was high and anterior. This intubated the  RCA easily. A 7-French venous sheath was placed in the right femoral vein  for the right heart catheterization.   HEMODYNAMIC RESULTS:  1.  Central aortic pressure 151/75 with a mean of 105.  2.  Left ventricular pressure 144/10 with an EDP of 15. There was no aortic      stenosis on pullback across the aortic valve. 3.  3.  Looking at the  simultaneous pulmonary capillary wedge to LV pressure, there was no      evidence of mitral stenosis.  3.  Right atrial pressure mean of 11.  4.  Right ventricle 33/9.  5.  Pulmonary artery 25/19 with a mean of 17.  6.  Pulmonary capillary wedge pressure mean of 13.  7.  Pulmonary artery saturation 65%.  8.  Femoral artery saturation 96%.  9.  Fick cardiac output 4.5 liters per minute. Fick cardiac index was 2.0      liters per minute.  10. Pulmonary vascular resistance 1.0 Woods units.   CORONARY ANGIOGRAPHY:  1.  Left main was angiographically normal.  2.  The left anterior descending artery was a long vessel wrapping the apex.      It gave off 2 small proximal diagonals. In the proximal LAD there was a      tubular 30-40% lesion. In the proximal to mid LAD, right at the takeoff      of the second diagonal, there was a 50% tubular lesion. The mid LAD  there was a 40% tubular lesion. In the lower branch of the first      diagonal there is a 99% lesion with subtotal occlusion of the vessel.  3.  The left circumflex was a large vessel. It gave off a large branching      ramus, a large OM-1 and moderate-to-large OM-2 and OM-3. In the mid      circumflex there was a 30% lesion, followed by a 50-60% lesion at the      takeoff of the OM-2. Distally there was a 30% lesion. In the upper      branch of a large ramus there was a 60-70% lesion. In the OM-1 there was      a 50% mid portion, followed by a 70-80% tubular lesion. In the OM-2      there was a 50% proximal lesion.  4.  The right coronary artery was a dominant vessel. There was a 40%      proximally, which possibly related to catheter induced spasm -- though      it did not change much with nitroglycerin throughout the proximal      portion. There were multiple irregularities about 30-40% stenoses. The      mid portion, after the takeoff the RV branch, there was a 40% stenosis.      Distally, prior to the takeoff of the PDA, there  was 40% stenosis. There      was a moderate size PDA and 2 small PLs.   LEFT VENTRICULOGRAM:  Done in the RAO approach, showed an EF of 50-55% by  visual estimate. There was some distal anterior wall hypokinesis. By  computer analysis the EF was 59%.   ASSESSMENT:  1.  Mostly nonobstructive coronary disease. However, there is a significant      volume of coronary atherosclerosis particulary in LCX distribution but      this does not appear to be severe enough to explain his symptoms. There      is also a subtotal occlusion of an apparently small diagonal which      explains his wall motion abnormality.  2.  Left ventriculogram shows a normal left ventricular function, with mild      distal anterior hypokinesis.  3.  Right heart pressures are normal.   DISCUSSION/PLAN:  Given that there are no obvious culprit lestions with  scattered disease, I think the best plan will be a trial of medical therapy,  plus/minus cardiopulmonary exercise testing to further elucidate the  etiology of his symptoms. I will review the films with our interventional  team. Should he fail medical therapy may consider PCI of ramus and OM in the  future.      Arvilla Meres, M.D. [facility]  Electronically Signed     DB/MEDQ  D:  09/24/2005  T:  09/24/2005  Job:  [id number]
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
## 2011-04-10 NOTE — Procedures (Signed)
Christopher Santiago, Christopher Santiago                ACCOUNT NO.:  [id number]   MEDICAL RECORD NO.:  [id number]          PATIENT TYPE:  OUT   LOCATION:  SLEEP CENTER                 FACILITY[facility]   PHYSICIAN:  Barbaraann Share, MD,FCCPDATE OF BIRTH:  22-Jul-1931   DATE OF STUDY:  10/02/2006                              NOCTURNAL POLYSOMNOGRAM   INDICATION FOR STUDY:  Hypersomnia and sleep apnea.   EPWORTH SLEEPINESS SCORE:  16.   SLEEP ARCHITECTURE:  The patient had total sleep time of 307 minutes with  significantly decreased REM and slow wave sleep.  Sleep onset latency was  normal an REM onset was very rapid at 26 minutes.  Sleep efficiency was  decreased at 82%.   RESPIRATORY DATA:  The patient underwent split night protocol where he was  found to have 199 obstructive events in the first 134 minutes of sleep.  This gave him a respiratory disturbance index of 89 events per hour over  that time period.  The events were not positional and there was moderate to  loud snoring noted throughout.  By protocol the patient was then placed on a  medium comfort gel nasal CPAP mask and ultimately titrated to 11 cm of  pressure with excellent control, even during REM.   OXYGEN DATA:  There were oxygen saturation's as low as 72% with the  patient's obstructive events.   CARDIAC DATA:  Rare PVC's were noted but none that were clinically  significant.   MOVEMENT-PARASOMNIA:  None.   IMPRESSIONS-RECOMMENDATIONS:  Severe obstructive sleep apnea/hypopnea  syndrome documented by split night protocol with a respiratory disturbance  index of 89 events per hour and oxygen saturation's as low as 72% during the  first half of the night.  The patient was then placed on CPAP with medium  comfort gel nasal mask and titrated to 11 cm of water pressure with  excellent control.      Barbaraann Share, MD,FCCP  Diplomate, American Board of Sleep  Medicine     [facility]/MEDQ  D:  10/11/2006 12:26:58  T:  10/11/2006  15:23:52  Job:  [id number]

## 2011-04-10 NOTE — Op Note (Signed)
   NAMEBAYARD, MORE                          ACCOUNT NO.:  [id number]   MEDICAL RECORD NO.:  [id number]                   PATIENT TYPE:  AMB   LOCATION:  DSC                                  FACILITY:  [facility]   PHYSICIAN:  Christopher E. Ezzard Standing, M.D.         DATE OF BIRTH:  [birth year]   DATE OF PROCEDURE:  05/29/2003  DATE OF DISCHARGE:                                 OPERATIVE REPORT   PREOPERATIVE DIAGNOSIS:  Right true vocal cord leukoplakia.   POSTOPERATIVE DIAGNOSIS:  Right true vocal cord leukoplakia.   OPERATION PERFORMED:  Microlaryngoscopy with excisional biopsy of right true  vocal cord lesion.   SURGEON:  Kristine Garbe. Ezzard Standing, M.D.   ANESTHESIA:  General.   INDICATIONS FOR PROCEDURE:  Christopher Santiago is a 77 year old gentleman who has  a previous history of right true vocal cord squamous cell carcinoma status  post treatment two years ago.  Has a recurrent area of leukoplakia along the  right anterior mid true vocal cord.  He is taken to the operating room at  this time for microlaryngoscopy and excisional biopsy of right vocal cord  leukoplakia.   DESCRIPTION OF PROCEDURE:  After adequate endotracheal anesthesia, direct  laryngoscopy was performed.  The base of tongue and epiglottis were normal.  Both piriform sinuses were clear.  AE folds were normal.  On evaluation of  the vocal cords, the left true vocal cord was normal in appearance.  The  right true vocal cord had a small area of leukoplakia and erythema in the  mid right true vocal cord region.  Photos were obtained and then using  scissors and cup forceps, the area of leukoplakia and erythroplasia was  excised.  This was sent to pathology.  There was minimal bleeding.  This  completed the procedure.  The patient was subsequently awakened from  anesthesia and transferred to recovery room postoperatively doing well.   DISPOSITION:  Tunis will be discharged home later this morning on Nexium 40  mg once a day  for three weeks, Tylenol  and Tylenol #3 as needed for pain.  He will call our office in two to three days to discuss results of  pathology.                                               Kristine Garbe. Ezzard Standing, M.D.    CEN/MEDQ  D:  05/29/2003  T:  05/29/2003  Job:  [id number]   cc:   Laurita Quint, M.D.  [address] [place]  [place] [address]  Fax: [phone]

## 2011-04-10 NOTE — Assessment & Plan Note (Signed)
[facility]                             PULMONARY OFFICE NOTE   [name], [name]                       MRN:          [id number]  DATE:11/26/2006                            DOB:          1931-03-20    SUBJECTIVE:  [facility] is a 77 year old gentleman who follows up today  after initiation of CPAP approximately 2 months ago for newly diagnosed  obstructive sleep apnea.  He tells me that he has been wearing his CPAP  reliably, and his wife confirms that he has had decreased snoring and  decreased restlessness at night.  He tells me that his humidifier runs  out before he wakes up in the morning, and sometimes this causes him to  wake up early.  He is not needing to get up to urinate as frequently as  he did prior to initiation of CPAP.  He is having cough, especially at  night.  He denies any postnasal drip.  He has been using nasal saline  washes, but only on an as-needed basis.  He is not having any GERD  symptoms.  He complains of some joint pain and muscle pain in bed in the  morning, especially in his ribs and arms.  His CPAP is currently set at  10 cmH2O.  His original respiratory disturbance index was 89.4 events  per hour, and this corrected to 2.7 events per hour on CPAP at 11 cmH2O.   CURRENT MEDICATIONS:  1. Crestor 10 mg daily.  2. Avandia 8 mg daily.  3. Metformin 500 mg daily.  4. Rhinocort 2 sprays each nostril once daily.  5. Aspirin 81 mg daily.  6. Spiriva 1 inhalation daily.  7. Altace 10 mg daily.  8. HCTZ 25 mg daily.  9. Norvasc 5 mg daily.  10.Citalopram 40 mg daily.  11.Stool softener once daily.  12.Viagra as needed.  13.Tussionex as needed.   PHYSICAL EXAMINATION:  GENERAL:  This is an obese, pleasant gentleman  who is in no distress on room air.  VITAL SIGNS:  Weight 246 pounds, temperature 97.9, blood pressure  136/74, heart rate 83, SPO2 96% on room air.  HEENT:  His oropharynx is benign, without lesion.  NECK:  Large but supple.  He has no stridor.  LUNGS:  Clear to auscultation bilaterally.  HEART:  Has a regular rate and rhythm, without murmur.  ABDOMEN:  Soft, nontender, nondistended, with positive bowel sounds.  EXTREMITIES:  Have no cyanosis, clubbing, or edema.   IMPRESSION:  1. Obstructive sleep apnea.  2. Nasal and throat dryness due to the inability of his humidifier to      maintain moisture throughout the entire night.  3. Obesity.  4. Chronic obstructive pulmonary disease, which appears to be      clinically stable on his current regimen.  5. Coronary artery disease.  6. Hypertension.  7. Chronic cough in the setting of postnasal drip.  8. History of vocal cord surgery and an angiotensin-converting enzyme      inhibitor.   PLANS:  1. Will continue his CPAP as currently ordered.  I  have asked him to      do nasal saline washes every day for the next 2 weeks, and he will      assess any improvement in his dryness.  I have also asked [facility] to evaluate his humidifier and make sure it is working      properly.  2. If his cough continues, even after addressing the issues of his      humidifier, then it may be necessary to change his Altace to an      angiotensin receptor blocker.  I will leave this to Dr. Gala Romney      to assess at their next visit.     Leslye Peer, MD  Electronically Signed    RSB/MedQ  DD: 01/24/2007  DT: 01/25/2007  Job #: [id number]   cc:   Bevelyn Buckles. Bensimhon, MD  Arta Silence, MD

## 2011-05-12 ENCOUNTER — Other Ambulatory Visit: Payer: Self-pay | Admitting: Family Medicine

## 2011-05-12 DIAGNOSIS — E119 Type 2 diabetes mellitus without complications: Secondary | ICD-10-CM

## 2011-05-13 ENCOUNTER — Other Ambulatory Visit (INDEPENDENT_AMBULATORY_CARE_PROVIDER_SITE_OTHER): Payer: Medicare Other | Admitting: Family Medicine

## 2011-05-13 DIAGNOSIS — E119 Type 2 diabetes mellitus without complications: Secondary | ICD-10-CM

## 2011-05-14 ENCOUNTER — Ambulatory Visit: Payer: Medicare Other | Admitting: Family Medicine

## 2011-05-16 ENCOUNTER — Encounter: Payer: Self-pay | Admitting: Family Medicine

## 2011-05-18 ENCOUNTER — Encounter: Payer: Self-pay | Admitting: Family Medicine

## 2011-05-18 ENCOUNTER — Ambulatory Visit (INDEPENDENT_AMBULATORY_CARE_PROVIDER_SITE_OTHER): Payer: Medicare Other | Admitting: Family Medicine

## 2011-05-18 VITALS — BP 130/76 | HR 76 | Temp 99.0°F | Ht 70.0 in | Wt 234.2 lb

## 2011-05-18 DIAGNOSIS — E119 Type 2 diabetes mellitus without complications: Secondary | ICD-10-CM

## 2011-05-18 NOTE — Patient Instructions (Signed)
Stop the actos.  I think your sugar will come up some, but we can recheck it in 3 months.  If you have concerns in the meantime, let us know.  Keep exercising.  Take care.  Glad to see you today.   Recheck labs in 3 months with OV a few days later.

## 2011-05-18 NOTE — Assessment & Plan Note (Signed)
>  25 min spent with face to face with patient, >50% counseling.  Stop actos and recheck in 3 months.  D/w pt about goal A1c 6-7 and avoiding hypoglycemia.  He understood.  Continue diet and exercise.

## 2011-05-18 NOTE — Progress Notes (Signed)
L plantar fasciitis. Per ortho Injected with steroids and doing well.  Dec in pain.   COPD is slightly more noticeable but he hasn't need the SABA. Compliant with other meds.  Feeling well o/w. Cough at baseline.  No wheeze.    Diabetes:  Using medications without difficulties:yes  Hypoglycemic episodes:no Hyperglycemic episodes:no Feet problems: as above Blood Sugars averaging: 86-140 eye exam within last year: yes.  We talked about dec in meds given his low A1c.  Meds, vitals, and allergies reviewed.   ROS: See HPI.  Otherwise negative.    GEN: nad, alert and oriented HEENT: mucous membranes moist NECK: supple w/o LA CV: rrr. PULM: ctab, no inc wob ABD: soft, +bs EXT: no edema SKIN: no acute rash  Diabetic foot exam: Normal inspection No skin breakdown No calluses  Normal DP pulses Normal sensation to light touch and monofilament Nails normal

## 2011-06-22 ENCOUNTER — Other Ambulatory Visit: Payer: Self-pay | Admitting: *Deleted

## 2011-06-22 MED ORDER — METFORMIN HCL 500 MG PO TABS: 500.0000 mg | ORAL_TABLET | Freq: Two times a day (BID) | ORAL | Status: DC

## 2011-08-25 ENCOUNTER — Other Ambulatory Visit (INDEPENDENT_AMBULATORY_CARE_PROVIDER_SITE_OTHER): Payer: Medicare Other

## 2011-08-25 DIAGNOSIS — E119 Type 2 diabetes mellitus without complications: Secondary | ICD-10-CM

## 2011-09-01 ENCOUNTER — Ambulatory Visit (INDEPENDENT_AMBULATORY_CARE_PROVIDER_SITE_OTHER): Payer: Medicare Other | Admitting: Family Medicine

## 2011-09-01 ENCOUNTER — Encounter: Payer: Self-pay | Admitting: Family Medicine

## 2011-09-01 VITALS — BP 130/80 | HR 80 | Temp 98.4°F | Wt 231.0 lb

## 2011-09-01 DIAGNOSIS — E119 Type 2 diabetes mellitus without complications: Secondary | ICD-10-CM

## 2011-09-01 DIAGNOSIS — E291 Testicular hypofunction: Secondary | ICD-10-CM

## 2011-09-01 DIAGNOSIS — Z23 Encounter for immunization: Secondary | ICD-10-CM

## 2011-09-01 MED ORDER — GLUCOSE BLOOD VI STRP: ORAL_STRIP | Status: DC

## 2011-09-01 NOTE — Patient Instructions (Signed)
Recheck your labs before a physical in 1/13. Stop the metformin and let me know if you sugars are greatly elevated. Keep exercising.  Take care.  Glad to see you.

## 2011-09-01 NOTE — Progress Notes (Signed)
Diabetes:  Using medications without difficulties: yes Hypoglycemic episodes: very rare Hyperglycemic episodes:no Feet problems:no Blood Sugars averaging: ~130 on checks A1c still 5.8 in spite of coming off actos.   Exercising frequently.   Eye exam in last year: yes  Nocturia, he'll talk to uro about this.   PMH and SH reviewed  Meds, vitals, and allergies reviewed.   ROS: See HPI.  Otherwise negative.    GEN: nad, alert and oriented HEENT: mucous membranes moist NECK: supple w/o LA CV: rrr. PULM: ctab, no inc wob ABD: soft, +bs EXT: no edema SKIN: no acute rash  Diabetic foot exam: Normal inspection No skin breakdown Callus under R great toe Normal DP pulses Normal sensation to light touch and monofilament Nails normal

## 2011-09-01 NOTE — Assessment & Plan Note (Signed)
Plan to stop metformin now, recheck sugar in 3 months, consider taper off thyroid medicine at that point (as TSH has been normal) and pt would like to be on minimal meds.  Continue diet and exercise.  >25 min spent with face to face with patient, >50% counseling.  Call back as needed in meantime.

## 2011-09-01 NOTE — Assessment & Plan Note (Signed)
Per Dr. Annabell Howells with uro.

## 2011-09-23 ENCOUNTER — Other Ambulatory Visit: Payer: Self-pay | Admitting: Family Medicine

## 2011-09-23 ENCOUNTER — Other Ambulatory Visit: Payer: Self-pay | Admitting: Internal Medicine

## 2011-11-13 ENCOUNTER — Other Ambulatory Visit: Payer: Self-pay | Admitting: Family Medicine

## 2011-11-13 ENCOUNTER — Other Ambulatory Visit: Payer: Self-pay | Admitting: Internal Medicine

## 2011-11-30 DIAGNOSIS — H52209 Unspecified astigmatism, unspecified eye: Secondary | ICD-10-CM | POA: Diagnosis not present

## 2011-11-30 DIAGNOSIS — E119 Type 2 diabetes mellitus without complications: Secondary | ICD-10-CM | POA: Diagnosis not present

## 2011-12-03 DIAGNOSIS — E291 Testicular hypofunction: Secondary | ICD-10-CM | POA: Diagnosis not present

## 2011-12-03 DIAGNOSIS — R3915 Urgency of urination: Secondary | ICD-10-CM | POA: Diagnosis not present

## 2011-12-04 ENCOUNTER — Other Ambulatory Visit (INDEPENDENT_AMBULATORY_CARE_PROVIDER_SITE_OTHER): Payer: Medicare Other

## 2011-12-04 DIAGNOSIS — E119 Type 2 diabetes mellitus without complications: Secondary | ICD-10-CM

## 2011-12-04 LAB — COMPREHENSIVE METABOLIC PANEL
ALT: 24 U/L (ref 0–53)
AST: 26 U/L (ref 0–37)
Alkaline Phosphatase: 42 U/L (ref 39–117)
BUN: 21 mg/dL (ref 6–23)
Creatinine, Ser: 1 mg/dL (ref 0.4–1.5)
GFR: 76.65 mL/min (ref >60.00)
Glucose, Bld: 150 mg/dL — ABNORMAL HIGH (ref 70–99)
Potassium: 4.2 mEq/L (ref 3.5–5.1)
Total Protein: 7.1 g/dL (ref 6.0–8.3)

## 2011-12-04 LAB — LIPID PANEL
Cholesterol: 182 mg/dL (ref 0–200)
HDL: 61.7 mg/dL (ref >39.00)
LDL Cholesterol: 108 mg/dL — ABNORMAL HIGH (ref 0–99)
Triglycerides: 63 mg/dL (ref 0.0–149.0)
VLDL: 12.6 mg/dL (ref 0.0–40.0)

## 2011-12-04 LAB — TSH: TSH: 0.82 u[IU]/mL (ref 0.35–5.50)

## 2011-12-06 ENCOUNTER — Telehealth: Payer: Self-pay | Admitting: Family Medicine

## 2011-12-06 DIAGNOSIS — E039 Hypothyroidism, unspecified: Secondary | ICD-10-CM

## 2011-12-06 DIAGNOSIS — E119 Type 2 diabetes mellitus without complications: Secondary | ICD-10-CM

## 2011-12-06 MED ORDER — THYROID 60 MG PO TABS: 30.0000 mg | ORAL_TABLET | Freq: Every day | ORAL | Status: DC

## 2011-12-06 NOTE — Telephone Encounter (Signed)
Please call pt.  TSH okay, mild inc in A1c, but not enough to restart meds.  Other labs are okay for now.  Dec to 1/2 tab of the amrour thyroid daily and recheck labs before OV in 3/13.  Thanks.  Tell pt I didn't order PSA he is seeing uro.

## 2011-12-07 NOTE — Telephone Encounter (Signed)
Patient advised.

## 2011-12-09 DIAGNOSIS — E291 Testicular hypofunction: Secondary | ICD-10-CM | POA: Diagnosis not present

## 2011-12-09 DIAGNOSIS — R3915 Urgency of urination: Secondary | ICD-10-CM | POA: Diagnosis not present

## 2011-12-09 DIAGNOSIS — R351 Nocturia: Secondary | ICD-10-CM | POA: Diagnosis not present

## 2011-12-09 DIAGNOSIS — N529 Male erectile dysfunction, unspecified: Secondary | ICD-10-CM | POA: Diagnosis not present

## 2011-12-20 ENCOUNTER — Encounter: Payer: Self-pay | Admitting: Family Medicine

## 2011-12-22 ENCOUNTER — Encounter: Payer: Self-pay | Admitting: Cardiovascular Disease

## 2011-12-22 ENCOUNTER — Ambulatory Visit (INDEPENDENT_AMBULATORY_CARE_PROVIDER_SITE_OTHER): Payer: Medicare Other | Admitting: Cardiovascular Disease

## 2011-12-22 VITALS — BP 167/50 | HR 77 | Ht 70.0 in | Wt 233.0 lb

## 2011-12-22 DIAGNOSIS — I2510 Atherosclerotic heart disease of native coronary artery without angina pectoris: Secondary | ICD-10-CM

## 2011-12-22 DIAGNOSIS — I10 Essential (primary) hypertension: Secondary | ICD-10-CM | POA: Diagnosis not present

## 2011-12-22 DIAGNOSIS — E11329 Type 2 diabetes mellitus with mild nonproliferative diabetic retinopathy without macular edema: Secondary | ICD-10-CM

## 2011-12-22 DIAGNOSIS — I739 Peripheral vascular disease, unspecified: Secondary | ICD-10-CM

## 2011-12-22 DIAGNOSIS — E113299 Type 2 diabetes mellitus with mild nonproliferative diabetic retinopathy without macular edema, unspecified eye: Secondary | ICD-10-CM

## 2011-12-22 DIAGNOSIS — E1139 Type 2 diabetes mellitus with other diabetic ophthalmic complication: Secondary | ICD-10-CM | POA: Diagnosis not present

## 2011-12-22 DIAGNOSIS — E7800 Pure hypercholesterolemia, unspecified: Secondary | ICD-10-CM

## 2011-12-22 NOTE — Assessment & Plan Note (Signed)
Cholesterol is above goal. He does not want to take more pravastatin. Have asked him to decrease his weight, increase his aerobic activity at the gym as he was doing better last year.

## 2011-12-22 NOTE — Assessment & Plan Note (Signed)
We have encouraged continued exercise, careful diet management in an effort to lose weight. 

## 2011-12-22 NOTE — Assessment & Plan Note (Signed)
Currently with no symptoms of angina. No further workup at this time. Continue current medication regimen. 

## 2011-12-22 NOTE — Assessment & Plan Note (Signed)
He does report having peripheral vascular disease of the lower extremities with blockage in the left leg. We'll try to obtain records for our system.

## 2011-12-22 NOTE — Assessment & Plan Note (Signed)
Blood pressure is elevated. We have asked him to monitor his blood pressure at home. He is trying to wean himself off most of his medications and he is reluctant to start any new medications. Have asked him to show his blood pressure numbers to either myself or Dr. Para March. Have also asked him to restart his aerobic activity at the gym as this would likely help his conditioning and blood pressure.

## 2011-12-22 NOTE — Patient Instructions (Signed)
You are doing well. No medication changes were made.  Please call us if you have new issues that need to be addressed before your next appt.  Your physician wants you to follow-up in: 12 months.  You will receive a reminder letter in the mail two months in advance. If you don't receive a letter, please call our office to schedule the follow-up appointment. 

## 2011-12-22 NOTE — Progress Notes (Signed)
Patient ID: [facility], male    DOB: Aug 12, 1931, 78 y.o.   MRN: [id number]  HPI Comments: Able is a delightful 78 year old man with a history of coronary artery disease with 70-80% lesion in the  marginal branch ( managed medically), PVD of the LE, hypertension, hyperlipidemia, diabetes, PAD, and COPD.  He returns today for routine followup.  He smoked for 50 years, stopping 13 years ago. Also with Dr. Sleep apnea and uses CPAP. He has peripheral vascular disease with blockage in his left lower extremity.   He reports that he has been trying to wean himself off his medications. He has decreased his diabetes medications, but his thyroid medication down. He feels well, uses his albuterol rarely, Spiriva daily. He reports that he did not note any benefit to her aerobic activity and stopped his treadmill and biking one year ago.  Since he stopped his exercise and stop his diabetes medications, his cholesterol has increased, hemoglobin A 1C has increased and blood pressure has also increased. He otherwise feels well and has no complaints. Currently is on a stretching and deep breathing with no aerobic activity.   EKG shows normal sinus rhythm with rate 77 beats per minute with right bundle branch block, left anterior fascicular block       Outpatient Encounter Prescriptions as of 12/22/2011  Medication Sig Dispense Refill  . albuterol (VENTOLIN HFA) 108 (90 BASE) MCG/ACT inhaler Inhale 2 puffs into the lungs as needed.        Marland Kitchen allopurinol (ZYLOPRIM) 100 MG tablet 2 tabs daily by mouth with food       . aspirin 81 MG EC tablet Take 81 mg by mouth daily.        Marland Kitchen BENICAR 20 MG tablet TAKE 1 TABLET DAILY  90 tablet  1  . Cholecalciferol (VITAMIN D3) 1000 UNITS tablet Take 1,000 Units by mouth daily.        . citalopram (CELEXA) 40 MG tablet TAKE 1 TABLET DAILY  120 tablet  2  . glucose blood test strip Check blood sugar 3 times a week or as needed.  50 each  3  . meloxicam (MOBIC) 7.5 MG tablet  Take 7.5 mg by mouth daily as needed.      . pravastatin (PRAVACHOL) 10 MG tablet TAKE 1 TABLET DAILY  120 tablet  0  . sildenafil (VIAGRA) 100 MG tablet Take 100 mg by mouth daily as needed.        . testosterone cypionate (DEPOTESTOTERONE CYPIONATE) 200 MG/ML injection Inject 200 mg into the muscle every 14 (fourteen) days.        Marland Kitchen thyroid (ARMOUR THYROID) 60 MG tablet Take 0.5 tablets (30 mg total) by mouth daily.      Marland Kitchen tiotropium (SPIRIVA HANDIHALER) 18 MCG inhalation capsule Place 18 mcg into inhaler and inhale daily.        Marland Kitchen DISCONTD: DICLOFENAC SODIUM PO Take 1 capsule by mouth 2 (two) times daily. Pt not sure of mg       . DISCONTD: hydrochlorothiazide 25 MG tablet Take 25 mg by mouth daily.           Review of Systems  Constitutional: Negative.   HENT: Negative.   Eyes: Negative.   Respiratory: Negative.   Cardiovascular: Negative.   Gastrointestinal: Negative.   Musculoskeletal: Negative.   Skin: Negative.   Neurological: Negative.   Hematological: Negative.   Psychiatric/Behavioral: Negative.   All other systems reviewed and are negative.  BP 167/50  Pulse 77  Ht 5\' 10"  (1.778 m)  Wt 233 lb (105.688 kg)  BMI 33.43 kg/m2  Physical Exam  Nursing note and vitals reviewed. Constitutional: He is oriented to person, place, and time. He appears well-developed and well-nourished.  HENT:  Head: Normocephalic.  Nose: Nose normal.  Mouth/Throat: Oropharynx is clear and moist.  Eyes: Conjunctivae are normal. Pupils are equal, round, and reactive to light.  Neck: Normal range of motion. Neck supple. No JVD present.  Cardiovascular: Normal rate, regular rhythm, S1 normal, S2 normal and intact distal pulses.  Exam reveals no gallop and no friction rub.   Murmur heard.  Crescendo systolic murmur is present with a grade of 2/6  Pulmonary/Chest: Effort normal and breath sounds normal. No respiratory distress. He has no wheezes. He has no rales. He exhibits no tenderness.    Abdominal: Soft. Bowel sounds are normal. He exhibits no distension. There is no tenderness.  Musculoskeletal: Normal range of motion. He exhibits no edema and no tenderness.  Lymphadenopathy:    He has no cervical adenopathy.  Neurological: He is alert and oriented to person, place, and time. Coordination normal.  Skin: Skin is warm and dry. No rash noted. No erythema.  Psychiatric: He has a normal mood and affect. His behavior is normal. Judgment and thought content normal.           Assessment and Plan

## 2012-01-06 ENCOUNTER — Other Ambulatory Visit (HOSPITAL_COMMUNITY): Payer: Self-pay | Admitting: Internal Medicine

## 2012-01-07 ENCOUNTER — Other Ambulatory Visit: Payer: Self-pay | Admitting: *Deleted

## 2012-01-08 MED ORDER — ALLOPURINOL 100 MG PO TABS: 200.0000 mg | ORAL_TABLET | Freq: Every day | ORAL | Status: DC

## 2012-01-08 NOTE — Telephone Encounter (Signed)
Refilled pravachol

## 2012-01-08 NOTE — Telephone Encounter (Signed)
Sent!

## 2012-01-28 ENCOUNTER — Other Ambulatory Visit: Payer: Medicare Other

## 2012-02-01 ENCOUNTER — Other Ambulatory Visit (INDEPENDENT_AMBULATORY_CARE_PROVIDER_SITE_OTHER): Payer: Medicare Other

## 2012-02-01 DIAGNOSIS — E119 Type 2 diabetes mellitus without complications: Secondary | ICD-10-CM | POA: Diagnosis not present

## 2012-02-01 DIAGNOSIS — E039 Hypothyroidism, unspecified: Secondary | ICD-10-CM | POA: Diagnosis not present

## 2012-02-01 LAB — LIPID PANEL
HDL: 57.7 mg/dL (ref >39.00)
LDL Cholesterol: 90 mg/dL (ref 0–99)
Total CHOL/HDL Ratio: 3
Triglycerides: 76 mg/dL (ref 0.0–149.0)
VLDL: 15.2 mg/dL (ref 0.0–40.0)

## 2012-02-01 LAB — HEMOGLOBIN A1C: Hgb A1c MFr Bld: 6.2 % (ref 4.6–6.5)

## 2012-02-02 ENCOUNTER — Ambulatory Visit (INDEPENDENT_AMBULATORY_CARE_PROVIDER_SITE_OTHER): Payer: Medicare Other | Admitting: Family Medicine

## 2012-02-02 ENCOUNTER — Encounter: Payer: Self-pay | Admitting: Family Medicine

## 2012-02-02 VITALS — BP 150/76 | HR 86 | Temp 98.7°F | Ht 69.5 in | Wt 233.8 lb

## 2012-02-02 DIAGNOSIS — I10 Essential (primary) hypertension: Secondary | ICD-10-CM

## 2012-02-02 DIAGNOSIS — E7800 Pure hypercholesterolemia, unspecified: Secondary | ICD-10-CM | POA: Diagnosis not present

## 2012-02-02 DIAGNOSIS — Z23 Encounter for immunization: Secondary | ICD-10-CM

## 2012-02-02 DIAGNOSIS — E113299 Type 2 diabetes mellitus with mild nonproliferative diabetic retinopathy without macular edema, unspecified eye: Secondary | ICD-10-CM

## 2012-02-02 DIAGNOSIS — G4733 Obstructive sleep apnea (adult) (pediatric): Secondary | ICD-10-CM

## 2012-02-02 DIAGNOSIS — N400 Enlarged prostate without lower urinary tract symptoms: Secondary | ICD-10-CM

## 2012-02-02 DIAGNOSIS — E039 Hypothyroidism, unspecified: Secondary | ICD-10-CM

## 2012-02-02 DIAGNOSIS — Z0000 Encounter for general adult medical examination without abnormal findings: Secondary | ICD-10-CM | POA: Diagnosis not present

## 2012-02-02 DIAGNOSIS — Z7189 Other specified counseling: Secondary | ICD-10-CM

## 2012-02-02 DIAGNOSIS — M109 Gout, unspecified: Secondary | ICD-10-CM

## 2012-02-02 DIAGNOSIS — E119 Type 2 diabetes mellitus without complications: Secondary | ICD-10-CM

## 2012-02-02 DIAGNOSIS — E038 Other specified hypothyroidism: Secondary | ICD-10-CM

## 2012-02-02 MED ORDER — PRAVASTATIN SODIUM 20 MG PO TABS: 20.0000 mg | ORAL_TABLET | Freq: Every day | ORAL | Status: DC

## 2012-02-02 MED ORDER — TAMSULOSIN HCL 0.4 MG PO CAPS: 0.4000 mg | ORAL_CAPSULE | Freq: Every day | ORAL | Status: DC

## 2012-02-02 NOTE — Patient Instructions (Signed)
Come back for fasting labs in 2 months.  Recheck A1c in 6 months with OV a few days later. Increase the pravastatin to 20mg  a day.  Start the flomax and see if that helps the urinary symptoms.  Take care.  Glad to see you.

## 2012-02-04 ENCOUNTER — Encounter: Payer: Self-pay | Admitting: Family Medicine

## 2012-02-04 DIAGNOSIS — Z0000 Encounter for general adult medical examination without abnormal findings: Secondary | ICD-10-CM | POA: Insufficient documentation

## 2012-02-04 DIAGNOSIS — Z7189 Other specified counseling: Secondary | ICD-10-CM | POA: Insufficient documentation

## 2012-02-04 NOTE — Assessment & Plan Note (Addendum)
Increase statin and recheck in 2 months.

## 2012-02-04 NOTE — Assessment & Plan Note (Signed)
Doing well, no change in meds.  

## 2012-02-04 NOTE — Assessment & Plan Note (Signed)
continue cpap °

## 2012-02-04 NOTE — Progress Notes (Signed)
I have personally reviewed the Medicare Annual Wellness questionnaire and have noted 1. The patient's medical and social history 2. Their use of alcohol, tobacco or illicit drugs 3. Their current medications and supplements 4. The patient's functional ability including ADL's, fall risks, home safety risks and hearing or visual             impairment. 5. Diet and physical activities 6. Evidence for depression or mood disorders  The patients weight, height, BMI have been recorded in the chart and and visual acuity is per eye clinic.  I have made referrals, counseling and provided education to the patient based review of the above and I have provided the pt with a written personalized care plan for preventive services.  Gout- no flares and doing well.    HTN.  No CP, SOB, BLE edema. Compliant with meds and doing well.    BPH. Discussed, worth treating per patient.  On testosterone per uro with DRE per uro.  See plan.    Elevated Cholesterol: Using medications without problems:yes Muscle aches: no Diet compliance:yes Exercise:yes  OSA, on CPAP, doing well.    Diabetes:  Using medications without difficulties:yes Hypoglycemic episodes:no Hyperglycemic episodes:no Feet problems:no Blood Sugars averaging: controlled per patient eye exam within last year:yes A1c 6.2.  COPD, rare inhaler use. Feeling well.  Not sob or wheezing.    PMH and SH reviewed  Meds, vitals, and allergies reviewed.   ROS: See HPI.  Otherwise negative.    GEN: nad, alert and oriented HEENT: mucous membranes moist NECK: supple w/o LA CV: rrr. PULM: ctab, no inc wob ABD: soft, +bs EXT: no edema SKIN: no acute rash  Diabetic foot exam: Normal inspection No skin breakdown No calluses  2+ DP pulses Normal sensation to light touch and monofilament Nails normal

## 2012-02-04 NOTE — Assessment & Plan Note (Signed)
No change in meds with DBP in 70s.  Will follow.

## 2012-02-04 NOTE — Assessment & Plan Note (Signed)
Stop replacement and recheck in 2 months.

## 2012-02-04 NOTE — Assessment & Plan Note (Signed)
Start flomax. D/w pt.

## 2012-02-04 NOTE — Assessment & Plan Note (Signed)
He had eye clinic f/u, a1c controlled, continue diet.

## 2012-03-16 ENCOUNTER — Other Ambulatory Visit: Payer: Self-pay | Admitting: Family Medicine

## 2012-04-04 ENCOUNTER — Other Ambulatory Visit: Payer: Medicare Other

## 2012-04-05 ENCOUNTER — Other Ambulatory Visit (INDEPENDENT_AMBULATORY_CARE_PROVIDER_SITE_OTHER): Payer: Medicare Other

## 2012-04-05 DIAGNOSIS — E039 Hypothyroidism, unspecified: Secondary | ICD-10-CM

## 2012-04-05 DIAGNOSIS — E7800 Pure hypercholesterolemia, unspecified: Secondary | ICD-10-CM

## 2012-04-05 DIAGNOSIS — IMO0001 Reserved for inherently not codable concepts without codable children: Secondary | ICD-10-CM

## 2012-04-05 DIAGNOSIS — E119 Type 2 diabetes mellitus without complications: Secondary | ICD-10-CM

## 2012-04-05 LAB — LIPID PANEL
HDL: 63.3 mg/dL (ref >39.00)
Total CHOL/HDL Ratio: 2

## 2012-04-05 LAB — TSH: TSH: 1.14 u[IU]/mL (ref 0.35–5.50)

## 2012-04-05 LAB — HEMOGLOBIN A1C: Hgb A1c MFr Bld: 6.2 % (ref 4.6–6.5)

## 2012-05-25 ENCOUNTER — Other Ambulatory Visit: Payer: Self-pay | Admitting: *Deleted

## 2012-05-25 MED ORDER — TIOTROPIUM BROMIDE MONOHYDRATE 18 MCG IN CAPS: 18.0000 ug | ORAL_CAPSULE | Freq: Every day | RESPIRATORY_TRACT | Status: DC

## 2012-05-27 ENCOUNTER — Other Ambulatory Visit: Payer: Self-pay

## 2012-05-27 NOTE — Telephone Encounter (Signed)
Pt wanted to confirm spiriva sent to [facility] or [facility]. Sent electronically on 05/25/12 to [facility]. Pt will ck with pharmacy.

## 2012-06-01 ENCOUNTER — Other Ambulatory Visit: Payer: Self-pay | Admitting: *Deleted

## 2012-06-01 MED ORDER — TIOTROPIUM BROMIDE MONOHYDRATE 18 MCG IN CAPS: 18.0000 ug | ORAL_CAPSULE | Freq: Every day | RESPIRATORY_TRACT | Status: DC

## 2012-06-01 NOTE — Telephone Encounter (Signed)
spiriva was sent to [facility] on 05/25/12.  Pt no longer gets his scripts from [facility], plan has changed to [facility].  Medicine re-sent to [facility].

## 2012-07-12 ENCOUNTER — Ambulatory Visit (INDEPENDENT_AMBULATORY_CARE_PROVIDER_SITE_OTHER): Payer: Medicare Other | Admitting: Family Medicine

## 2012-07-12 ENCOUNTER — Encounter: Payer: Self-pay | Admitting: Family Medicine

## 2012-07-12 VITALS — BP 124/60 | HR 95 | Temp 98.6°F | Wt 230.8 lb

## 2012-07-12 DIAGNOSIS — M7520 Bicipital tendinitis, unspecified shoulder: Secondary | ICD-10-CM

## 2012-07-12 MED ORDER — TRAMADOL HCL 50 MG PO TABS: 50.0000 mg | ORAL_TABLET | Freq: Two times a day (BID) | ORAL | Status: AC | PRN

## 2012-07-12 NOTE — Assessment & Plan Note (Signed)
Doesn't appear to be cuff related.  Handout given, anatomy d/w pt. He'll use tramadol and home exercise routine.  If not improved, then refer to ortho. He agrees.

## 2012-07-12 NOTE — Progress Notes (Signed)
R shoulder discomfort with certain movements, worse overhead.  Going on for about 1 month.  No injury but pain with lifting.  Pain will wax and wane, better with rest, but can get mildly stiff.  No L shoulder pain. No neck or R elbow pain.  Pain can move down the biceps in the R arm.  [place]  Sugar has been controlled per patient.    Meds, vitals, and allergies reviewed.   ROS: See HPI.  Otherwise, noncontributory.  nad ncat Neck supple R shoulder with pain on full abduction above the head but no lateral arm pain on int/ext rotation No impingement and no arm drop Proximal biceps is tender, +pain on biceps testing Distally nv intact Normal rom at the elbow

## 2012-07-12 NOTE — Patient Instructions (Signed)
Use the exercises and take tramadol as needed for pain.  If not better, we'll get you set up with orthopedics.

## 2012-07-13 DIAGNOSIS — M5137 Other intervertebral disc degeneration, lumbosacral region: Secondary | ICD-10-CM | POA: Diagnosis not present

## 2012-07-13 DIAGNOSIS — S336XXA Sprain of sacroiliac joint, initial encounter: Secondary | ICD-10-CM | POA: Diagnosis not present

## 2012-07-13 DIAGNOSIS — M47812 Spondylosis without myelopathy or radiculopathy, cervical region: Secondary | ICD-10-CM | POA: Diagnosis not present

## 2012-07-13 DIAGNOSIS — M999 Biomechanical lesion, unspecified: Secondary | ICD-10-CM | POA: Diagnosis not present

## 2012-07-14 DIAGNOSIS — M5137 Other intervertebral disc degeneration, lumbosacral region: Secondary | ICD-10-CM | POA: Diagnosis not present

## 2012-07-14 DIAGNOSIS — M47812 Spondylosis without myelopathy or radiculopathy, cervical region: Secondary | ICD-10-CM | POA: Diagnosis not present

## 2012-07-14 DIAGNOSIS — M999 Biomechanical lesion, unspecified: Secondary | ICD-10-CM | POA: Diagnosis not present

## 2012-07-14 DIAGNOSIS — S336XXA Sprain of sacroiliac joint, initial encounter: Secondary | ICD-10-CM | POA: Diagnosis not present

## 2012-07-15 DIAGNOSIS — S336XXA Sprain of sacroiliac joint, initial encounter: Secondary | ICD-10-CM | POA: Diagnosis not present

## 2012-07-15 DIAGNOSIS — M999 Biomechanical lesion, unspecified: Secondary | ICD-10-CM | POA: Diagnosis not present

## 2012-07-15 DIAGNOSIS — M5137 Other intervertebral disc degeneration, lumbosacral region: Secondary | ICD-10-CM | POA: Diagnosis not present

## 2012-07-15 DIAGNOSIS — M47812 Spondylosis without myelopathy or radiculopathy, cervical region: Secondary | ICD-10-CM | POA: Diagnosis not present

## 2012-07-16 DIAGNOSIS — M999 Biomechanical lesion, unspecified: Secondary | ICD-10-CM | POA: Diagnosis not present

## 2012-07-16 DIAGNOSIS — M5137 Other intervertebral disc degeneration, lumbosacral region: Secondary | ICD-10-CM | POA: Diagnosis not present

## 2012-07-16 DIAGNOSIS — M47812 Spondylosis without myelopathy or radiculopathy, cervical region: Secondary | ICD-10-CM | POA: Diagnosis not present

## 2012-07-16 DIAGNOSIS — S336XXA Sprain of sacroiliac joint, initial encounter: Secondary | ICD-10-CM | POA: Diagnosis not present

## 2012-07-18 DIAGNOSIS — M999 Biomechanical lesion, unspecified: Secondary | ICD-10-CM | POA: Diagnosis not present

## 2012-07-18 DIAGNOSIS — S336XXA Sprain of sacroiliac joint, initial encounter: Secondary | ICD-10-CM | POA: Diagnosis not present

## 2012-07-18 DIAGNOSIS — M47812 Spondylosis without myelopathy or radiculopathy, cervical region: Secondary | ICD-10-CM | POA: Diagnosis not present

## 2012-07-18 DIAGNOSIS — M5137 Other intervertebral disc degeneration, lumbosacral region: Secondary | ICD-10-CM | POA: Diagnosis not present

## 2012-07-19 DIAGNOSIS — M47812 Spondylosis without myelopathy or radiculopathy, cervical region: Secondary | ICD-10-CM | POA: Diagnosis not present

## 2012-07-19 DIAGNOSIS — S336XXA Sprain of sacroiliac joint, initial encounter: Secondary | ICD-10-CM | POA: Diagnosis not present

## 2012-07-19 DIAGNOSIS — M5137 Other intervertebral disc degeneration, lumbosacral region: Secondary | ICD-10-CM | POA: Diagnosis not present

## 2012-07-19 DIAGNOSIS — M999 Biomechanical lesion, unspecified: Secondary | ICD-10-CM | POA: Diagnosis not present

## 2012-08-17 ENCOUNTER — Other Ambulatory Visit (INDEPENDENT_AMBULATORY_CARE_PROVIDER_SITE_OTHER): Payer: Medicare Other

## 2012-08-17 DIAGNOSIS — E119 Type 2 diabetes mellitus without complications: Secondary | ICD-10-CM

## 2012-08-17 DIAGNOSIS — E1139 Type 2 diabetes mellitus with other diabetic ophthalmic complication: Secondary | ICD-10-CM

## 2012-08-22 ENCOUNTER — Ambulatory Visit: Payer: Medicare Other | Admitting: Family Medicine

## 2012-08-22 DIAGNOSIS — M999 Biomechanical lesion, unspecified: Secondary | ICD-10-CM | POA: Diagnosis not present

## 2012-08-22 DIAGNOSIS — S336XXA Sprain of sacroiliac joint, initial encounter: Secondary | ICD-10-CM | POA: Diagnosis not present

## 2012-08-22 DIAGNOSIS — M47812 Spondylosis without myelopathy or radiculopathy, cervical region: Secondary | ICD-10-CM | POA: Diagnosis not present

## 2012-08-22 DIAGNOSIS — M5137 Other intervertebral disc degeneration, lumbosacral region: Secondary | ICD-10-CM | POA: Diagnosis not present

## 2012-08-23 ENCOUNTER — Ambulatory Visit (INDEPENDENT_AMBULATORY_CARE_PROVIDER_SITE_OTHER): Payer: Medicare Other | Admitting: Family Medicine

## 2012-08-23 ENCOUNTER — Encounter: Payer: Self-pay | Admitting: Family Medicine

## 2012-08-23 VITALS — BP 142/70 | HR 67 | Temp 98.3°F | Wt 225.0 lb

## 2012-08-23 DIAGNOSIS — E1139 Type 2 diabetes mellitus with other diabetic ophthalmic complication: Secondary | ICD-10-CM

## 2012-08-23 DIAGNOSIS — R05 Cough: Secondary | ICD-10-CM | POA: Insufficient documentation

## 2012-08-23 DIAGNOSIS — H579 Unspecified disorder of eye and adnexa: Secondary | ICD-10-CM

## 2012-08-23 DIAGNOSIS — Z23 Encounter for immunization: Secondary | ICD-10-CM | POA: Diagnosis not present

## 2012-08-23 DIAGNOSIS — E11329 Type 2 diabetes mellitus with mild nonproliferative diabetic retinopathy without macular edema: Secondary | ICD-10-CM

## 2012-08-23 DIAGNOSIS — M109 Gout, unspecified: Secondary | ICD-10-CM | POA: Diagnosis not present

## 2012-08-23 DIAGNOSIS — E113299 Type 2 diabetes mellitus with mild nonproliferative diabetic retinopathy without macular edema, unspecified eye: Secondary | ICD-10-CM

## 2012-08-23 MED ORDER — HYDROCOD POLST-CHLORPHEN POLST 10-8 MG/5ML PO LQCR: 5.0000 mL | Freq: Every evening | ORAL | Status: DC | PRN

## 2012-08-23 NOTE — Assessment & Plan Note (Signed)
Doing well, no change in meds.  

## 2012-08-23 NOTE — Assessment & Plan Note (Signed)
Doesn't appear infectious and with his history he wants to f/u with ENT.  This is reasonable.  Use tussionex prn in meantime qhs.  He agrees.

## 2012-08-23 NOTE — Patient Instructions (Signed)
Use the tussionex at night has needed for the cough and call about follow up with ENT.   Don't change your meds otherwise.   Recheck labs in 6 months before a physical.  Take care.  Glad to see you.

## 2012-08-23 NOTE — Progress Notes (Signed)
Diabetes:  Diet controlled Using medications without difficulties: no meds Hypoglycemic episodes:no Hyperglycemic episodes:no Feet problems:no Blood Sugars averaging:rarely checked, was 137 a few days ago.   A1c controlled at 6.2.  Diet discussed.  He isn't on strict low carb diet but he doesn't usually have much bread in his diet.    No recent gout flares.  Compliant with meds.  No ADE from allopurinol.   Frequent throat clearing from postnasal gtt.  He'll have some voice changes.  H/o vocal cord intervention and had seen ENT prev.  He'll make arrangements for this.  No fevers, no vomiting.  No dysphagia. He does have some relief of the nighttime cough with tussionex, used the last few nights.   He has been seeing a chiropractor about his back and that is helping.    PMH and SH reviewed  Meds, vitals, and allergies reviewed.   ROS: See HPI.  Otherwise negative.    GEN: nad, alert and oriented HEENT: mucous membranes moist, OP exam wnl except for mild cobblestoning.   NECK: supple w/o LA CV: rrr. PULM: ctab, no inc wob ABD: soft, +bs EXT: no edema SKIN: no acute rash  Diabetic foot exam: Normal inspection No skin breakdown No calluses  Normal DP pulses Normal sensation to light touch and monofilament Nails normal

## 2012-08-23 NOTE — Assessment & Plan Note (Signed)
Controlled off meds.  Doing well.  Continue as is.  Recheck in 6 months.

## 2012-08-24 DIAGNOSIS — S336XXA Sprain of sacroiliac joint, initial encounter: Secondary | ICD-10-CM | POA: Diagnosis not present

## 2012-08-24 DIAGNOSIS — M5137 Other intervertebral disc degeneration, lumbosacral region: Secondary | ICD-10-CM | POA: Diagnosis not present

## 2012-08-24 DIAGNOSIS — M47812 Spondylosis without myelopathy or radiculopathy, cervical region: Secondary | ICD-10-CM | POA: Diagnosis not present

## 2012-08-24 DIAGNOSIS — M999 Biomechanical lesion, unspecified: Secondary | ICD-10-CM | POA: Diagnosis not present

## 2012-08-25 DIAGNOSIS — R490 Dysphonia: Secondary | ICD-10-CM | POA: Diagnosis not present

## 2012-08-25 DIAGNOSIS — M47812 Spondylosis without myelopathy or radiculopathy, cervical region: Secondary | ICD-10-CM | POA: Diagnosis not present

## 2012-08-25 DIAGNOSIS — M999 Biomechanical lesion, unspecified: Secondary | ICD-10-CM | POA: Diagnosis not present

## 2012-08-25 DIAGNOSIS — J310 Chronic rhinitis: Secondary | ICD-10-CM | POA: Diagnosis not present

## 2012-08-25 DIAGNOSIS — M5137 Other intervertebral disc degeneration, lumbosacral region: Secondary | ICD-10-CM | POA: Diagnosis not present

## 2012-08-25 DIAGNOSIS — S336XXA Sprain of sacroiliac joint, initial encounter: Secondary | ICD-10-CM | POA: Diagnosis not present

## 2012-08-29 DIAGNOSIS — S336XXA Sprain of sacroiliac joint, initial encounter: Secondary | ICD-10-CM | POA: Diagnosis not present

## 2012-08-29 DIAGNOSIS — M5137 Other intervertebral disc degeneration, lumbosacral region: Secondary | ICD-10-CM | POA: Diagnosis not present

## 2012-08-29 DIAGNOSIS — M999 Biomechanical lesion, unspecified: Secondary | ICD-10-CM | POA: Diagnosis not present

## 2012-08-29 DIAGNOSIS — M47812 Spondylosis without myelopathy or radiculopathy, cervical region: Secondary | ICD-10-CM | POA: Diagnosis not present

## 2012-08-31 DIAGNOSIS — S336XXA Sprain of sacroiliac joint, initial encounter: Secondary | ICD-10-CM | POA: Diagnosis not present

## 2012-08-31 DIAGNOSIS — M999 Biomechanical lesion, unspecified: Secondary | ICD-10-CM | POA: Diagnosis not present

## 2012-08-31 DIAGNOSIS — M5137 Other intervertebral disc degeneration, lumbosacral region: Secondary | ICD-10-CM | POA: Diagnosis not present

## 2012-08-31 DIAGNOSIS — M47812 Spondylosis without myelopathy or radiculopathy, cervical region: Secondary | ICD-10-CM | POA: Diagnosis not present

## 2012-09-01 DIAGNOSIS — M47812 Spondylosis without myelopathy or radiculopathy, cervical region: Secondary | ICD-10-CM | POA: Diagnosis not present

## 2012-09-01 DIAGNOSIS — M999 Biomechanical lesion, unspecified: Secondary | ICD-10-CM | POA: Diagnosis not present

## 2012-09-01 DIAGNOSIS — M5137 Other intervertebral disc degeneration, lumbosacral region: Secondary | ICD-10-CM | POA: Diagnosis not present

## 2012-09-01 DIAGNOSIS — S336XXA Sprain of sacroiliac joint, initial encounter: Secondary | ICD-10-CM | POA: Diagnosis not present

## 2012-09-05 DIAGNOSIS — M999 Biomechanical lesion, unspecified: Secondary | ICD-10-CM | POA: Diagnosis not present

## 2012-09-05 DIAGNOSIS — S336XXA Sprain of sacroiliac joint, initial encounter: Secondary | ICD-10-CM | POA: Diagnosis not present

## 2012-09-05 DIAGNOSIS — M5137 Other intervertebral disc degeneration, lumbosacral region: Secondary | ICD-10-CM | POA: Diagnosis not present

## 2012-09-05 DIAGNOSIS — M47812 Spondylosis without myelopathy or radiculopathy, cervical region: Secondary | ICD-10-CM | POA: Diagnosis not present

## 2012-09-12 DIAGNOSIS — M999 Biomechanical lesion, unspecified: Secondary | ICD-10-CM | POA: Diagnosis not present

## 2012-09-12 DIAGNOSIS — S336XXA Sprain of sacroiliac joint, initial encounter: Secondary | ICD-10-CM | POA: Diagnosis not present

## 2012-09-12 DIAGNOSIS — M47812 Spondylosis without myelopathy or radiculopathy, cervical region: Secondary | ICD-10-CM | POA: Diagnosis not present

## 2012-09-12 DIAGNOSIS — M5137 Other intervertebral disc degeneration, lumbosacral region: Secondary | ICD-10-CM | POA: Diagnosis not present

## 2012-09-15 DIAGNOSIS — M47812 Spondylosis without myelopathy or radiculopathy, cervical region: Secondary | ICD-10-CM | POA: Diagnosis not present

## 2012-09-15 DIAGNOSIS — M999 Biomechanical lesion, unspecified: Secondary | ICD-10-CM | POA: Diagnosis not present

## 2012-09-15 DIAGNOSIS — M5137 Other intervertebral disc degeneration, lumbosacral region: Secondary | ICD-10-CM | POA: Diagnosis not present

## 2012-09-15 DIAGNOSIS — S336XXA Sprain of sacroiliac joint, initial encounter: Secondary | ICD-10-CM | POA: Diagnosis not present

## 2012-09-20 DIAGNOSIS — M999 Biomechanical lesion, unspecified: Secondary | ICD-10-CM | POA: Diagnosis not present

## 2012-09-20 DIAGNOSIS — M47812 Spondylosis without myelopathy or radiculopathy, cervical region: Secondary | ICD-10-CM | POA: Diagnosis not present

## 2012-09-20 DIAGNOSIS — M5137 Other intervertebral disc degeneration, lumbosacral region: Secondary | ICD-10-CM | POA: Diagnosis not present

## 2012-09-20 DIAGNOSIS — S336XXA Sprain of sacroiliac joint, initial encounter: Secondary | ICD-10-CM | POA: Diagnosis not present

## 2012-09-22 DIAGNOSIS — M999 Biomechanical lesion, unspecified: Secondary | ICD-10-CM | POA: Diagnosis not present

## 2012-09-22 DIAGNOSIS — M47812 Spondylosis without myelopathy or radiculopathy, cervical region: Secondary | ICD-10-CM | POA: Diagnosis not present

## 2012-09-22 DIAGNOSIS — S336XXA Sprain of sacroiliac joint, initial encounter: Secondary | ICD-10-CM | POA: Diagnosis not present

## 2012-09-22 DIAGNOSIS — M5137 Other intervertebral disc degeneration, lumbosacral region: Secondary | ICD-10-CM | POA: Diagnosis not present

## 2012-09-27 DIAGNOSIS — M5137 Other intervertebral disc degeneration, lumbosacral region: Secondary | ICD-10-CM | POA: Diagnosis not present

## 2012-09-27 DIAGNOSIS — M47812 Spondylosis without myelopathy or radiculopathy, cervical region: Secondary | ICD-10-CM | POA: Diagnosis not present

## 2012-09-27 DIAGNOSIS — M999 Biomechanical lesion, unspecified: Secondary | ICD-10-CM | POA: Diagnosis not present

## 2012-09-27 DIAGNOSIS — S336XXA Sprain of sacroiliac joint, initial encounter: Secondary | ICD-10-CM | POA: Diagnosis not present

## 2012-09-29 DIAGNOSIS — M999 Biomechanical lesion, unspecified: Secondary | ICD-10-CM | POA: Diagnosis not present

## 2012-09-29 DIAGNOSIS — M5137 Other intervertebral disc degeneration, lumbosacral region: Secondary | ICD-10-CM | POA: Diagnosis not present

## 2012-09-29 DIAGNOSIS — S336XXA Sprain of sacroiliac joint, initial encounter: Secondary | ICD-10-CM | POA: Diagnosis not present

## 2012-09-29 DIAGNOSIS — M47812 Spondylosis without myelopathy or radiculopathy, cervical region: Secondary | ICD-10-CM | POA: Diagnosis not present

## 2012-10-05 DIAGNOSIS — M999 Biomechanical lesion, unspecified: Secondary | ICD-10-CM | POA: Diagnosis not present

## 2012-10-05 DIAGNOSIS — M47812 Spondylosis without myelopathy or radiculopathy, cervical region: Secondary | ICD-10-CM | POA: Diagnosis not present

## 2012-10-05 DIAGNOSIS — M5137 Other intervertebral disc degeneration, lumbosacral region: Secondary | ICD-10-CM | POA: Diagnosis not present

## 2012-10-05 DIAGNOSIS — S336XXA Sprain of sacroiliac joint, initial encounter: Secondary | ICD-10-CM | POA: Diagnosis not present

## 2012-10-10 DIAGNOSIS — S336XXA Sprain of sacroiliac joint, initial encounter: Secondary | ICD-10-CM | POA: Diagnosis not present

## 2012-10-10 DIAGNOSIS — M47812 Spondylosis without myelopathy or radiculopathy, cervical region: Secondary | ICD-10-CM | POA: Diagnosis not present

## 2012-10-10 DIAGNOSIS — M5137 Other intervertebral disc degeneration, lumbosacral region: Secondary | ICD-10-CM | POA: Diagnosis not present

## 2012-10-10 DIAGNOSIS — M999 Biomechanical lesion, unspecified: Secondary | ICD-10-CM | POA: Diagnosis not present

## 2012-10-12 DIAGNOSIS — M999 Biomechanical lesion, unspecified: Secondary | ICD-10-CM | POA: Diagnosis not present

## 2012-10-12 DIAGNOSIS — S336XXA Sprain of sacroiliac joint, initial encounter: Secondary | ICD-10-CM | POA: Diagnosis not present

## 2012-10-12 DIAGNOSIS — M5137 Other intervertebral disc degeneration, lumbosacral region: Secondary | ICD-10-CM | POA: Diagnosis not present

## 2012-10-12 DIAGNOSIS — M47812 Spondylosis without myelopathy or radiculopathy, cervical region: Secondary | ICD-10-CM | POA: Diagnosis not present

## 2012-10-17 DIAGNOSIS — M999 Biomechanical lesion, unspecified: Secondary | ICD-10-CM | POA: Diagnosis not present

## 2012-10-17 DIAGNOSIS — M47812 Spondylosis without myelopathy or radiculopathy, cervical region: Secondary | ICD-10-CM | POA: Diagnosis not present

## 2012-10-17 DIAGNOSIS — M5137 Other intervertebral disc degeneration, lumbosacral region: Secondary | ICD-10-CM | POA: Diagnosis not present

## 2012-10-17 DIAGNOSIS — S336XXA Sprain of sacroiliac joint, initial encounter: Secondary | ICD-10-CM | POA: Diagnosis not present

## 2012-10-24 DIAGNOSIS — S336XXA Sprain of sacroiliac joint, initial encounter: Secondary | ICD-10-CM | POA: Diagnosis not present

## 2012-10-24 DIAGNOSIS — M47812 Spondylosis without myelopathy or radiculopathy, cervical region: Secondary | ICD-10-CM | POA: Diagnosis not present

## 2012-10-24 DIAGNOSIS — M5137 Other intervertebral disc degeneration, lumbosacral region: Secondary | ICD-10-CM | POA: Diagnosis not present

## 2012-10-24 DIAGNOSIS — M999 Biomechanical lesion, unspecified: Secondary | ICD-10-CM | POA: Diagnosis not present

## 2012-10-25 ENCOUNTER — Other Ambulatory Visit: Payer: Self-pay | Admitting: Family Medicine

## 2012-11-08 DIAGNOSIS — S336XXA Sprain of sacroiliac joint, initial encounter: Secondary | ICD-10-CM | POA: Diagnosis not present

## 2012-11-08 DIAGNOSIS — M999 Biomechanical lesion, unspecified: Secondary | ICD-10-CM | POA: Diagnosis not present

## 2012-11-08 DIAGNOSIS — M5137 Other intervertebral disc degeneration, lumbosacral region: Secondary | ICD-10-CM | POA: Diagnosis not present

## 2012-11-08 DIAGNOSIS — M47812 Spondylosis without myelopathy or radiculopathy, cervical region: Secondary | ICD-10-CM | POA: Diagnosis not present

## 2012-11-30 DIAGNOSIS — E119 Type 2 diabetes mellitus without complications: Secondary | ICD-10-CM | POA: Diagnosis not present

## 2012-11-30 DIAGNOSIS — H3530 Unspecified macular degeneration: Secondary | ICD-10-CM | POA: Diagnosis not present

## 2012-11-30 DIAGNOSIS — Z961 Presence of intraocular lens: Secondary | ICD-10-CM | POA: Diagnosis not present

## 2012-12-07 DIAGNOSIS — S336XXA Sprain of sacroiliac joint, initial encounter: Secondary | ICD-10-CM | POA: Diagnosis not present

## 2012-12-07 DIAGNOSIS — M5137 Other intervertebral disc degeneration, lumbosacral region: Secondary | ICD-10-CM | POA: Diagnosis not present

## 2012-12-07 DIAGNOSIS — M999 Biomechanical lesion, unspecified: Secondary | ICD-10-CM | POA: Diagnosis not present

## 2012-12-07 DIAGNOSIS — M47812 Spondylosis without myelopathy or radiculopathy, cervical region: Secondary | ICD-10-CM | POA: Diagnosis not present

## 2012-12-13 ENCOUNTER — Other Ambulatory Visit: Payer: Self-pay | Admitting: *Deleted

## 2012-12-13 ENCOUNTER — Encounter: Payer: Self-pay | Admitting: Family Medicine

## 2012-12-13 MED ORDER — HYDROCOD POLST-CHLORPHEN POLST 10-8 MG/5ML PO LQCR: 5.0000 mL | Freq: Every evening | ORAL | Status: DC | PRN

## 2012-12-13 NOTE — Telephone Encounter (Signed)
Rx phoned to pharmacy.  

## 2012-12-13 NOTE — Telephone Encounter (Signed)
Faxed refill request from yesterday (no time to enter it).  Please advise.

## 2012-12-13 NOTE — Telephone Encounter (Signed)
Call it in.  If the cough continues or he continue to need this medicine, then he needs OV for recheck.

## 2012-12-14 DIAGNOSIS — N138 Other obstructive and reflux uropathy: Secondary | ICD-10-CM | POA: Diagnosis not present

## 2012-12-14 DIAGNOSIS — E291 Testicular hypofunction: Secondary | ICD-10-CM | POA: Diagnosis not present

## 2012-12-14 DIAGNOSIS — N401 Enlarged prostate with lower urinary tract symptoms: Secondary | ICD-10-CM | POA: Diagnosis not present

## 2012-12-19 DIAGNOSIS — R3915 Urgency of urination: Secondary | ICD-10-CM | POA: Diagnosis not present

## 2012-12-19 DIAGNOSIS — N529 Male erectile dysfunction, unspecified: Secondary | ICD-10-CM | POA: Diagnosis not present

## 2012-12-19 DIAGNOSIS — E291 Testicular hypofunction: Secondary | ICD-10-CM | POA: Diagnosis not present

## 2012-12-19 DIAGNOSIS — N3941 Urge incontinence: Secondary | ICD-10-CM | POA: Diagnosis not present

## 2012-12-21 ENCOUNTER — Ambulatory Visit: Payer: Medicare Other | Admitting: Cardiovascular Disease

## 2012-12-27 ENCOUNTER — Encounter: Payer: Self-pay | Admitting: Cardiovascular Disease

## 2012-12-27 ENCOUNTER — Ambulatory Visit (INDEPENDENT_AMBULATORY_CARE_PROVIDER_SITE_OTHER): Payer: Medicare Other | Admitting: Cardiovascular Disease

## 2012-12-27 VITALS — BP 148/84 | HR 71 | Ht 70.0 in | Wt 222.0 lb

## 2012-12-27 DIAGNOSIS — I10 Essential (primary) hypertension: Secondary | ICD-10-CM

## 2012-12-27 DIAGNOSIS — R0602 Shortness of breath: Secondary | ICD-10-CM | POA: Diagnosis not present

## 2012-12-27 DIAGNOSIS — J449 Chronic obstructive pulmonary disease, unspecified: Secondary | ICD-10-CM | POA: Insufficient documentation

## 2012-12-27 DIAGNOSIS — E113299 Type 2 diabetes mellitus with mild nonproliferative diabetic retinopathy without macular edema, unspecified eye: Secondary | ICD-10-CM

## 2012-12-27 DIAGNOSIS — E11329 Type 2 diabetes mellitus with mild nonproliferative diabetic retinopathy without macular edema: Secondary | ICD-10-CM

## 2012-12-27 DIAGNOSIS — E1139 Type 2 diabetes mellitus with other diabetic ophthalmic complication: Secondary | ICD-10-CM

## 2012-12-27 DIAGNOSIS — I2510 Atherosclerotic heart disease of native coronary artery without angina pectoris: Secondary | ICD-10-CM | POA: Diagnosis not present

## 2012-12-27 DIAGNOSIS — J441 Chronic obstructive pulmonary disease with (acute) exacerbation: Secondary | ICD-10-CM | POA: Insufficient documentation

## 2012-12-27 DIAGNOSIS — E7800 Pure hypercholesterolemia, unspecified: Secondary | ICD-10-CM

## 2012-12-27 DIAGNOSIS — I739 Peripheral vascular disease, unspecified: Secondary | ICD-10-CM

## 2012-12-27 MED ORDER — BUDESONIDE-FORMOTEROL FUMARATE 160-4.5 MCG/ACT IN AERO: 2.0000 | INHALATION_SPRAY | Freq: Two times a day (BID) | RESPIRATORY_TRACT | Status: DC

## 2012-12-27 NOTE — Assessment & Plan Note (Signed)
We spent a long time discussing his prior cardiac catheterization in 2006, diffuse three-vessel disease. We discussed the importance of good cholesterol control. He does have back pain and wonders if this could be secondary to pravastatin. He we'll hold the statin for several weeks to see if this improves his symptoms. If no improvement, he we'll restart the statin. If symptoms do resolve, we will need to determine if he is willing to try an alternate statin. Prior symptoms on Crestor.

## 2012-12-27 NOTE — Patient Instructions (Addendum)
Please hold the pravastatin for a few weeks to see if pain improves If pain goes away, call the office  If pain continues, it may not be from the cholesterol medication  We will prescribe symbicort inhaler, hold the spiriva  Please call us if you have new issues that need to be addressed before your next appt.  Your physician wants you to follow-up in: 6 months.  You will receive a reminder letter in the mail two months in advance. If you don't receive a letter, please call our office to schedule the follow-up appointment.

## 2012-12-27 NOTE — Assessment & Plan Note (Signed)
He is concerned the Spiriva is not doing anything and wonders if there is any medicine in the packets. He is interested in trying alternate inhalers. We have given him a prescription for Symbicort and have suggested he follow up with Dr. Para March for further discussion on which inhalers to use.

## 2012-12-27 NOTE — Assessment & Plan Note (Signed)
We have encouraged continued exercise, careful diet management in an effort to lose weight. 

## 2012-12-27 NOTE — Assessment & Plan Note (Signed)
No claudication-type symptoms at this time.

## 2012-12-27 NOTE — Assessment & Plan Note (Signed)
Blood pressure is well controlled on today's visit. No changes made to the medications. 

## 2012-12-27 NOTE — Progress Notes (Addendum)
Patient ID: Christopher Santiago, male    DOB: [birth year], 79 y.o.   MRN: [id number]  HPI Comments: Christopher Santiago is a  79 year old man with a history of diffuse moderate three-vessel coronary artery disease cardiac catheterization in 2006 ( managed medically), PVD of the LE, hypertension, hyperlipidemia, diabetes, PAD, and COPD.  He returns today for routine followup.    He smoked for 50 years, stopping over 10 years ago. History of Sleep apnea and uses CPAP. He has peripheral vascular disease with blockage in his left lower extremity.   He continues to try  to wean himself off his medications. He reports having bilateral rib and flank pain and wonders if this could be secondary to pravastatin. Cholesterol level was perfect on his last cholesterol check, achieved less than 150 and LDL less than 70. He continues to exercise several times per week. He otherwise feels well and has no complaints. He goes to [employer] 3 times per week and does body toning with some light weights. In the past, had myalgias on Crestor.   EKG shows normal sinus rhythm with rate 71 beats per minute with right bundle branch block, left anterior fascicular block   CORONARY ANGIOGRAPHY: In 2006, November  2.  The left anterior descending artery was a long vessel wrapping the apex.      It gave off 2 small proximal diagonals. In the proximal LAD there was a      tubular 30-40% lesion. In the proximal to mid LAD, right at the takeoff      of the second diagonal, there was a 50% tubular lesion. The mid LAD      there was a 40% tubular lesion. In the lower branch of the first      diagonal there is a 99% lesion with subtotal occlusion of the vessel.  3.  The left circumflex was a large vessel. It gave off a large branching      ramus, a large OM-1 and moderate-to-large OM-2 and OM-3. In the mid      circumflex there was a 30% lesion, followed by a 50-60% lesion at the      takeoff of the OM-2. Distally there was a 30% lesion. In the  upper      branch of a large ramus there was a 60-70% lesion. In the OM-1 there was      a 50% mid portion, followed by a 70-80% tubular lesion. In the OM-2      there was a 50% proximal lesion.  4.  The right coronary artery was a dominant vessel. There was a 40%      proximally, which possibly related to catheter induced spasm -- though      it did not change much with nitroglycerin throughout the proximal      portion. There were multiple irregularities about 30-40% stenoses. The      mid portion, after the takeoff the RV branch, there was a 40% stenosis.      Distally, prior to the takeoff of the PDA, there was 40% stenosis. There      was a moderate size PDA and 2 small PLs.       Outpatient Encounter Prescriptions as of 12/27/2012  Medication Sig Dispense Refill  . albuterol (VENTOLIN HFA) 108 (90 BASE) MCG/ACT inhaler Inhale 2 puffs into the lungs as needed.        Marland Kitchen allopurinol (ZYLOPRIM) 100 MG tablet Take 2 tablets (200 mg total) by mouth  daily.  180 tablet  3  . aspirin 81 MG EC tablet Take 81 mg by mouth daily.        . budesonide (RHINOCORT AQUA) 32 MCG/ACT nasal spray Place 1 spray into the nose daily as needed.      . chlorpheniramine-HYDROcodone (TUSSIONEX PENNKINETIC ER) 10-8 MG/5ML LQCR Take 5 mLs by mouth at bedtime as needed.  180 mL  0  . Cholecalciferol (VITAMIN D3) 1000 UNITS tablet Take 1,000 Units by mouth daily.        . citalopram (CELEXA) 40 MG tablet TAKE 1 TABLET DAILY  120 tablet  2  . glucose blood test strip Check blood sugar 3 times a week or as needed.  50 each  3  . meloxicam (MOBIC) 7.5 MG tablet Take 7.5 mg by mouth daily as needed.      . pravastatin (PRAVACHOL) 20 MG tablet Take 1 tablet (20 mg total) by mouth daily.  120 tablet  2  . sildenafil (VIAGRA) 100 MG tablet Take 100 mg by mouth daily as needed.        . Tamsulosin HCl (FLOMAX) 0.4 MG CAPS Take 1 capsule daily  120 capsule  3  . testosterone cypionate (DEPOTESTOTERONE CYPIONATE) 200 MG/ML  injection Inject 200 mg into the muscle every 14 (fourteen) days.        Marland Kitchen thyroid (ARMOUR) 30 MG tablet Take 30 mg by mouth daily.      Marland Kitchen tiotropium (SPIRIVA HANDIHALER) 18 MCG inhalation capsule Place 1 capsule (18 mcg total) into inhaler and inhale daily.  90 capsule  2  . [DISCONTINUED] BENICAR 20 MG tablet TAKE 1 TABLET DAILY  90 tablet  3  . [DISCONTINUED] docusate sodium (COLACE) 100 MG capsule Take 100 mg by mouth 2 (two) times daily as needed.      . [DISCONTINUED] thyroid (ARMOUR) 60 MG tablet Take 60 mg by mouth daily.         Review of Systems  Constitutional: Negative.   HENT: Negative.   Eyes: Negative.   Respiratory: Negative.   Cardiovascular: Negative.   Gastrointestinal: Negative.   Musculoskeletal: Positive for myalgias and back pain.  Skin: Negative.   Neurological: Negative.   Hematological: Negative.   Psychiatric/Behavioral: Negative.   All other systems reviewed and are negative.    BP 148/84  Pulse 71  Ht 5\' 10"  (1.778 m)  Wt 222 lb (100.699 kg)  BMI 31.85 kg/m2  Physical Exam  Nursing note and vitals reviewed. Constitutional: He is oriented to person, place, and time. He appears well-developed and well-nourished.  HENT:  Head: Normocephalic.  Nose: Nose normal.  Mouth/Throat: Oropharynx is clear and moist.  Eyes: Conjunctivae normal are normal. Pupils are equal, round, and reactive to light.  Neck: Normal range of motion. Neck supple. No JVD present.  Cardiovascular: Normal rate, regular rhythm, S1 normal, S2 normal and intact distal pulses.  Exam reveals no gallop and no friction rub.   Murmur heard.  Crescendo systolic murmur is present with a grade of 2/6  Pulmonary/Chest: Effort normal and breath sounds normal. No respiratory distress. He has no wheezes. He has no rales. He exhibits no tenderness.  Abdominal: Soft. Bowel sounds are normal. He exhibits no distension. There is no tenderness.  Musculoskeletal: Normal range of motion. He exhibits  no edema and no tenderness.  Lymphadenopathy:    He has no cervical adenopathy.  Neurological: He is alert and oriented to person, place, and time. Coordination normal.  Skin: Skin is  warm and dry. No rash noted. No erythema.  Psychiatric: He has a normal mood and affect. His behavior is normal. Judgment and thought content normal.           Assessment and Plan

## 2012-12-27 NOTE — Assessment & Plan Note (Addendum)
Goal less than 150, LDL less than 70. Brief trial hold on his statin for rib pain

## 2012-12-27 NOTE — Assessment & Plan Note (Signed)
Currently with no symptoms of angina. No further workup at this time.   

## 2013-01-04 DIAGNOSIS — M5137 Other intervertebral disc degeneration, lumbosacral region: Secondary | ICD-10-CM | POA: Diagnosis not present

## 2013-01-04 DIAGNOSIS — S336XXA Sprain of sacroiliac joint, initial encounter: Secondary | ICD-10-CM | POA: Diagnosis not present

## 2013-01-04 DIAGNOSIS — M999 Biomechanical lesion, unspecified: Secondary | ICD-10-CM | POA: Diagnosis not present

## 2013-01-04 DIAGNOSIS — M47812 Spondylosis without myelopathy or radiculopathy, cervical region: Secondary | ICD-10-CM | POA: Diagnosis not present

## 2013-01-19 ENCOUNTER — Other Ambulatory Visit: Payer: Self-pay | Admitting: Family Medicine

## 2013-01-19 DIAGNOSIS — E119 Type 2 diabetes mellitus without complications: Secondary | ICD-10-CM

## 2013-01-19 DIAGNOSIS — M109 Gout, unspecified: Secondary | ICD-10-CM

## 2013-01-27 ENCOUNTER — Other Ambulatory Visit: Payer: Medicare Other

## 2013-02-01 DIAGNOSIS — S336XXA Sprain of sacroiliac joint, initial encounter: Secondary | ICD-10-CM | POA: Diagnosis not present

## 2013-02-01 DIAGNOSIS — M999 Biomechanical lesion, unspecified: Secondary | ICD-10-CM | POA: Diagnosis not present

## 2013-02-01 DIAGNOSIS — M47812 Spondylosis without myelopathy or radiculopathy, cervical region: Secondary | ICD-10-CM | POA: Diagnosis not present

## 2013-02-01 DIAGNOSIS — M5137 Other intervertebral disc degeneration, lumbosacral region: Secondary | ICD-10-CM | POA: Diagnosis not present

## 2013-02-02 ENCOUNTER — Encounter: Payer: Self-pay | Admitting: Family Medicine

## 2013-02-02 ENCOUNTER — Ambulatory Visit (INDEPENDENT_AMBULATORY_CARE_PROVIDER_SITE_OTHER): Payer: Medicare Other | Admitting: Family Medicine

## 2013-02-02 VITALS — BP 114/66 | HR 68 | Temp 98.2°F

## 2013-02-02 DIAGNOSIS — E119 Type 2 diabetes mellitus without complications: Secondary | ICD-10-CM

## 2013-02-02 DIAGNOSIS — E11329 Type 2 diabetes mellitus with mild nonproliferative diabetic retinopathy without macular edema: Secondary | ICD-10-CM | POA: Diagnosis not present

## 2013-02-02 DIAGNOSIS — E1139 Type 2 diabetes mellitus with other diabetic ophthalmic complication: Secondary | ICD-10-CM

## 2013-02-02 DIAGNOSIS — Z0000 Encounter for general adult medical examination without abnormal findings: Secondary | ICD-10-CM

## 2013-02-02 DIAGNOSIS — M109 Gout, unspecified: Secondary | ICD-10-CM

## 2013-02-02 DIAGNOSIS — R05 Cough: Secondary | ICD-10-CM | POA: Diagnosis not present

## 2013-02-02 DIAGNOSIS — E291 Testicular hypofunction: Secondary | ICD-10-CM

## 2013-02-02 DIAGNOSIS — F329 Major depressive disorder, single episode, unspecified: Secondary | ICD-10-CM | POA: Diagnosis not present

## 2013-02-02 DIAGNOSIS — E7800 Pure hypercholesterolemia, unspecified: Secondary | ICD-10-CM | POA: Diagnosis not present

## 2013-02-02 DIAGNOSIS — G4730 Sleep apnea, unspecified: Secondary | ICD-10-CM

## 2013-02-02 DIAGNOSIS — E113299 Type 2 diabetes mellitus with mild nonproliferative diabetic retinopathy without macular edema, unspecified eye: Secondary | ICD-10-CM

## 2013-02-02 DIAGNOSIS — Z1331 Encounter for screening for depression: Secondary | ICD-10-CM

## 2013-02-02 DIAGNOSIS — E038 Other specified hypothyroidism: Secondary | ICD-10-CM

## 2013-02-02 LAB — LIPID PANEL
Cholesterol: 159 mg/dL (ref 0–200)
Triglycerides: 58 mg/dL (ref 0.0–149.0)

## 2013-02-02 LAB — TSH: TSH: 0.56 u[IU]/mL (ref 0.35–5.50)

## 2013-02-02 LAB — COMPREHENSIVE METABOLIC PANEL
BUN: 21 mg/dL (ref 6–23)
CO2: 26 mEq/L (ref 19–32)
Calcium: 9.2 mg/dL (ref 8.4–10.5)
Chloride: 104 mEq/L (ref 96–112)
Creatinine, Ser: 1.1 mg/dL (ref 0.4–1.5)
GFR: 69.18 mL/min (ref >60.00)

## 2013-02-02 LAB — URIC ACID: Uric Acid, Serum: 5.9 mg/dL (ref 4.0–7.8)

## 2013-02-02 MED ORDER — BENZONATATE 200 MG PO CAPS: 200.0000 mg | ORAL_CAPSULE | Freq: Three times a day (TID) | ORAL | Status: DC | PRN

## 2013-02-02 NOTE — Assessment & Plan Note (Signed)
He'll stay off statin for now. Continue diet and exercise.

## 2013-02-02 NOTE — Assessment & Plan Note (Signed)
Lungs clear, add on tessalon and see if this provides some sx relief.  Has had f/u with ENT prev.

## 2013-02-02 NOTE — Assessment & Plan Note (Signed)
Per uro.  

## 2013-02-02 NOTE — Assessment & Plan Note (Signed)
Mood is good, he'd like to cut back on SSRI to either 40mg  qod or 20mg  a day.  He'll try and notify me if sx are worsening. No SIHI.

## 2013-02-02 NOTE — Assessment & Plan Note (Signed)
TSH wnl, continue current meds.  

## 2013-02-02 NOTE — Progress Notes (Signed)
I have personally reviewed the Medicare Annual Wellness questionnaire and have noted 1. The patient's medical and social history 2. Their use of alcohol, tobacco or illicit drugs 3. Their current medications and supplements 4. The patient's functional ability including ADL's, fall risks, home safety risks and hearing or visual             impairment. 5. Diet and physical activities 6. Evidence for depression or mood disorders  The patients weight, height, BMI have been recorded in the chart and visual acuity is per eye clinic.  I have made referrals, counseling and provided education to the patient based review of the above and I have provided the pt with a written personalized care plan for preventive services.  See scanned forms.  Routine anticipatory guidance given to patient.  See health maintenance. Flu 2013 Shingles 2010 PNA 2013 Tetanus 2007 Colonoscopy 2011 Prostate cancer screening per [facility] Advance directive d/w pt.  Wife designated if incapacitated Cognitive function addressed- see scanned forms- and if abnormal then additional documentation follows.   Elevated Cholesterol: Aches on pravastatin noted. Improved off medicine.  Diet compliance: d/w pt.  Exercise:d/w pt.   Hoarse voice.  Has had ENT eval and no recurrent CA.  Most bothered by cough and hoarse voice, chronic condition for patient.  Asking about options.   Diabetes:  No meds Hypoglycemic episodes:no Hyperglycemic episodes:no Feet problems:no eye exam within last year:yes  OSA on CPAP, doing well.  No trouble with compliance. Sleep is improved.    Hypothyroid.  TSH wnl.  No ant neck mass or dysphagia.    PMH and SH reviewed  Meds, vitals, and allergies reviewed.   ROS: See HPI.  Otherwise negative.    GEN: nad, alert and oriented HEENT: mucous membranes moist NECK: supple w/o LA, no TMG CV: rrr. PULM: ctab, no inc wob ABD: soft, +bs EXT: no edema SKIN: no acute rash  Diabetic foot  exam: Normal inspection No skin breakdown No calluses  Normal DP pulses Normal sensation to light touch and monofilament Nails normal

## 2013-02-02 NOTE — Patient Instructions (Addendum)
Go to the lab on the way out.  We'll contact you with your lab report.  Stay off the pravastatin for now.  If you continue to improve off the medicine, then the pravastatin was likely the cause.   Please call GI this summer if you haven't heard from them about the potential need for a colonoscopy.   Call urology about the possible medicines for the urinary symptoms.  [phone]  Recheck in 6 months with labs ahead of time.  Cut the citalopram back to 40mg  every other day or 20mg  a day.  Notify me if you don't tolerate that.  Take tessalon for the throat symptoms.   Take care.  Glad to see you.

## 2013-02-02 NOTE — Assessment & Plan Note (Signed)
Doing well with CPAP.  Continue as is.

## 2013-02-02 NOTE — Assessment & Plan Note (Signed)
See scanned forms.  Routine anticipatory guidance given to patient.  See health maintenance. Flu 2013 Shingles 2010 PNA 2013 Tetanus 2007 Colonoscopy 2011 Prostate cancer screening per [facility] Advance directive d/w pt.  Wife designated if incapacitated Cognitive function addressed- see scanned forms- and if abnormal then additional documentation follows.

## 2013-02-02 NOTE — Assessment & Plan Note (Signed)
Controlled by A1c, no change in meds today.  Diet and sugar d/w pt, recheck later in 2014.

## 2013-02-03 ENCOUNTER — Encounter: Payer: Self-pay | Admitting: *Deleted

## 2013-02-14 ENCOUNTER — Encounter: Payer: Self-pay | Admitting: Family Medicine

## 2013-02-14 ENCOUNTER — Other Ambulatory Visit: Payer: Self-pay | Admitting: Family Medicine

## 2013-02-15 MED ORDER — HYDROCOD POLST-CHLORPHEN POLST 10-8 MG/5ML PO LQCR: 5.0000 mL | Freq: Every evening | ORAL | Status: DC | PRN

## 2013-02-15 NOTE — Telephone Encounter (Signed)
rx called into pharmacy

## 2013-02-15 NOTE — Telephone Encounter (Signed)
Please call in.  Thanks.   

## 2013-02-16 ENCOUNTER — Telehealth: Payer: Self-pay | Admitting: *Deleted

## 2013-02-16 DIAGNOSIS — E119 Type 2 diabetes mellitus without complications: Secondary | ICD-10-CM

## 2013-02-16 NOTE — Telephone Encounter (Signed)
It is ordered.  We hadn't done it as his DM2 was well controlled, but it is reasonable to consider checking. Please schedule the lab visit.  Thanks.

## 2013-02-16 NOTE — Telephone Encounter (Signed)
Patient has contacted Lyla Son through email that he wants to schedule a lab appt for a urine microalbumin on a Tuesday between April 7 and 14.  I don't see in his chart that this was recommended.  Please advise and I will take care of it.

## 2013-02-16 NOTE — Telephone Encounter (Signed)
Christopher Santiago advised to schedule.

## 2013-02-27 ENCOUNTER — Other Ambulatory Visit: Payer: Self-pay | Admitting: *Deleted

## 2013-02-27 ENCOUNTER — Other Ambulatory Visit (INDEPENDENT_AMBULATORY_CARE_PROVIDER_SITE_OTHER): Payer: Medicare Other

## 2013-02-27 DIAGNOSIS — E119 Type 2 diabetes mellitus without complications: Secondary | ICD-10-CM

## 2013-02-27 MED ORDER — ALLOPURINOL 100 MG PO TABS: 200.0000 mg | ORAL_TABLET | Freq: Every day | ORAL | Status: DC

## 2013-02-28 ENCOUNTER — Other Ambulatory Visit: Payer: Medicare Other

## 2013-03-01 DIAGNOSIS — S336XXA Sprain of sacroiliac joint, initial encounter: Secondary | ICD-10-CM | POA: Diagnosis not present

## 2013-03-01 DIAGNOSIS — M999 Biomechanical lesion, unspecified: Secondary | ICD-10-CM | POA: Diagnosis not present

## 2013-03-01 DIAGNOSIS — M47812 Spondylosis without myelopathy or radiculopathy, cervical region: Secondary | ICD-10-CM | POA: Diagnosis not present

## 2013-03-01 DIAGNOSIS — M5137 Other intervertebral disc degeneration, lumbosacral region: Secondary | ICD-10-CM | POA: Diagnosis not present

## 2013-03-14 ENCOUNTER — Encounter: Payer: Self-pay | Admitting: Family Medicine

## 2013-03-14 ENCOUNTER — Ambulatory Visit (INDEPENDENT_AMBULATORY_CARE_PROVIDER_SITE_OTHER): Payer: Medicare Other | Admitting: Family Medicine

## 2013-03-14 VITALS — BP 142/68 | HR 75 | Temp 98.1°F | Wt 217.5 lb

## 2013-03-14 DIAGNOSIS — R5381 Other malaise: Secondary | ICD-10-CM | POA: Diagnosis not present

## 2013-03-14 DIAGNOSIS — S61209A Unspecified open wound of unspecified finger without damage to nail, initial encounter: Secondary | ICD-10-CM

## 2013-03-14 DIAGNOSIS — R5383 Other fatigue: Secondary | ICD-10-CM | POA: Diagnosis not present

## 2013-03-14 DIAGNOSIS — R252 Cramp and spasm: Secondary | ICD-10-CM

## 2013-03-14 DIAGNOSIS — M109 Gout, unspecified: Secondary | ICD-10-CM | POA: Diagnosis not present

## 2013-03-14 DIAGNOSIS — Z23 Encounter for immunization: Secondary | ICD-10-CM

## 2013-03-14 DIAGNOSIS — S61219A Laceration without foreign body of unspecified finger without damage to nail, initial encounter: Secondary | ICD-10-CM

## 2013-03-14 LAB — BASIC METABOLIC PANEL
CO2: 28 mEq/L (ref 19–32)
Calcium: 9.2 mg/dL (ref 8.4–10.5)
Chloride: 100 mEq/L (ref 96–112)
Creatinine, Ser: 1.1 mg/dL (ref 0.4–1.5)
Glucose, Bld: 99 mg/dL (ref 70–99)
Sodium: 134 mEq/L — ABNORMAL LOW (ref 135–145)

## 2013-03-14 LAB — MAGNESIUM: Magnesium: 2.1 mg/dL (ref 1.5–2.5)

## 2013-03-14 NOTE — Progress Notes (Signed)
He has had cramps in the hand and legs (esp shins), groin and feet, worse recently.  Going on for weeks now.  No clear trigger.  Not on statin- stopping the cholesterol medicine didn't help.  Can happen when he's asleep.  H/o vascular disease but these events are clearly not exertional and he has no chest pain.   He still has some cough, but the tussionex has helped more than tessalon.    He also cut his R hand 3 days ago.  Wood bucked off the table saw, hit the R 2nd digit on the extensor side.  He cleaned it and taped it up.   Meds, vitals, and allergies reviewed.   ROS: See HPI.  Otherwise, noncontributory.  nad ncat Mmm rrr ctab abd soft, obese Ext w/o edema 1+ DP and radial pulses, these feel symmetric Calf, shin, thighs and arms not ttp No rash R hand with transverse lac ~1 cm across. At the 2nd MCP, extensor side.  Good tissue apposition w/o erythema or purulent discharge.  No motor deficit distally.

## 2013-03-14 NOTE — Patient Instructions (Addendum)
If the area on your right hand gets red or sore, then let us know.   Go to the lab on the way out.  We'll contact you with your lab report. If the cramps continue, then let me know.  I would try to get back on the cholesterol medicine when this issue with cramping is resolved.

## 2013-03-15 DIAGNOSIS — R252 Cramp and spasm: Secondary | ICD-10-CM | POA: Insufficient documentation

## 2013-03-15 DIAGNOSIS — S61219A Laceration without foreign body of unspecified finger without damage to nail, initial encounter: Secondary | ICD-10-CM | POA: Insufficient documentation

## 2013-03-15 NOTE — Assessment & Plan Note (Signed)
Source is not clear to me but this doesn't appear vascular as they are clearly not exertional- and he can exert w/o symptoms.  This isn't typical for PMR.  See notes on labs.  I would have him see rheum.  Not on statin.  >25 min spent with face to face with patient, >50% counseling and/or coordinating care.

## 2013-03-15 NOTE — Assessment & Plan Note (Signed)
Clean and stable exam w/o motor deficit.  Retaped with steristrips and covered.  F/u prn.  He agrees. Routine cautions given, td done.

## 2013-03-20 DIAGNOSIS — M47812 Spondylosis without myelopathy or radiculopathy, cervical region: Secondary | ICD-10-CM | POA: Diagnosis not present

## 2013-03-20 DIAGNOSIS — M999 Biomechanical lesion, unspecified: Secondary | ICD-10-CM | POA: Diagnosis not present

## 2013-03-20 DIAGNOSIS — S336XXA Sprain of sacroiliac joint, initial encounter: Secondary | ICD-10-CM | POA: Diagnosis not present

## 2013-03-20 DIAGNOSIS — M5137 Other intervertebral disc degeneration, lumbosacral region: Secondary | ICD-10-CM | POA: Diagnosis not present

## 2013-04-03 DIAGNOSIS — S336XXA Sprain of sacroiliac joint, initial encounter: Secondary | ICD-10-CM | POA: Diagnosis not present

## 2013-04-03 DIAGNOSIS — M999 Biomechanical lesion, unspecified: Secondary | ICD-10-CM | POA: Diagnosis not present

## 2013-04-03 DIAGNOSIS — M5137 Other intervertebral disc degeneration, lumbosacral region: Secondary | ICD-10-CM | POA: Diagnosis not present

## 2013-04-03 DIAGNOSIS — M47812 Spondylosis without myelopathy or radiculopathy, cervical region: Secondary | ICD-10-CM | POA: Diagnosis not present

## 2013-04-24 DIAGNOSIS — M999 Biomechanical lesion, unspecified: Secondary | ICD-10-CM | POA: Diagnosis not present

## 2013-04-24 DIAGNOSIS — M5137 Other intervertebral disc degeneration, lumbosacral region: Secondary | ICD-10-CM | POA: Diagnosis not present

## 2013-04-24 DIAGNOSIS — M47812 Spondylosis without myelopathy or radiculopathy, cervical region: Secondary | ICD-10-CM | POA: Diagnosis not present

## 2013-04-24 DIAGNOSIS — S336XXA Sprain of sacroiliac joint, initial encounter: Secondary | ICD-10-CM | POA: Diagnosis not present

## 2013-04-25 DIAGNOSIS — R252 Cramp and spasm: Secondary | ICD-10-CM | POA: Diagnosis not present

## 2013-04-25 DIAGNOSIS — IMO0001 Reserved for inherently not codable concepts without codable children: Secondary | ICD-10-CM | POA: Diagnosis not present

## 2013-05-01 ENCOUNTER — Other Ambulatory Visit: Payer: Self-pay

## 2013-05-01 MED ORDER — ALBUTEROL SULFATE HFA 108 (90 BASE) MCG/ACT IN AERS: 2.0000 | INHALATION_SPRAY | RESPIRATORY_TRACT | Status: DC | PRN

## 2013-05-01 NOTE — Telephone Encounter (Signed)
Pt request refill on albuterol inhaler [facility]; pt said likes to keep on hand and has been long time since refilled(not sure of time frame).Please advise.

## 2013-05-01 NOTE — Telephone Encounter (Signed)
Sent.  Notify us if needed frequently.  Thanks.

## 2013-05-09 ENCOUNTER — Telehealth: Payer: Self-pay | Admitting: Family Medicine

## 2013-05-09 ENCOUNTER — Encounter: Payer: Self-pay | Admitting: Family Medicine

## 2013-05-09 ENCOUNTER — Encounter: Payer: Self-pay | Admitting: Internal Medicine

## 2013-05-09 DIAGNOSIS — J449 Chronic obstructive pulmonary disease, unspecified: Secondary | ICD-10-CM

## 2013-05-09 NOTE — Telephone Encounter (Signed)
See note from patient via mychart.  Please call pt.  If he is significantly SOB then he needs OV.  Otherwise he's going to need pre and post spirometry at [facility] with a CXR (not a visit with me).  Have him avoid using the albuterol before the initial testing.  Let me know if you need an order for the spirometry; dx COPD.  CXR order is in.  Thanks.

## 2013-05-09 NOTE — Telephone Encounter (Signed)
If the symbicort make it worse, the other med may do the same thing.  That's why I would hold off until the testing is done.  Thanks.

## 2013-05-09 NOTE — Telephone Encounter (Signed)
Patient notified as instructed by telephone.  Appointment schedule for Tuesday 05/16/13. Will need order placed for CXR.

## 2013-05-09 NOTE — Telephone Encounter (Signed)
He needs a OV with the CXR/pre and post done, then come back to the room for MD eval  Per Dr. Para March.

## 2013-05-09 NOTE — Telephone Encounter (Signed)
Spoke to patient and was advised that he does not feel that this is an emergency because this has been going on for years. Patient stated that he will come in for testing if Dr. Para March feels that it is necessary. Patient stated that he was thinking that the new medication he requested to try would help.

## 2013-05-10 NOTE — Telephone Encounter (Signed)
Had already placed cxr order.

## 2013-05-16 ENCOUNTER — Ambulatory Visit (INDEPENDENT_AMBULATORY_CARE_PROVIDER_SITE_OTHER): Payer: Medicare Other | Admitting: Family Medicine

## 2013-05-16 ENCOUNTER — Encounter: Payer: Self-pay | Admitting: Family Medicine

## 2013-05-16 ENCOUNTER — Ambulatory Visit (INDEPENDENT_AMBULATORY_CARE_PROVIDER_SITE_OTHER)
Admission: RE | Admit: 2013-05-16 | Discharge: 2013-05-16 | Disposition: A | Discharge status: Home / Self Care | Payer: Medicare Other | Source: Ambulatory Visit | Attending: Family Medicine | Admitting: Family Medicine

## 2013-05-16 VITALS — BP 156/72 | HR 75 | Temp 98.2°F | Wt 222.0 lb

## 2013-05-16 DIAGNOSIS — J441 Chronic obstructive pulmonary disease with (acute) exacerbation: Secondary | ICD-10-CM | POA: Diagnosis not present

## 2013-05-16 DIAGNOSIS — J449 Chronic obstructive pulmonary disease, unspecified: Secondary | ICD-10-CM | POA: Diagnosis not present

## 2013-05-16 DIAGNOSIS — R05 Cough: Secondary | ICD-10-CM | POA: Diagnosis not present

## 2013-05-16 MED ORDER — CHLORPHENIRAMINE MALEATE 4 MG PO TABS: 4.0000 mg | ORAL_TABLET | Freq: Two times a day (BID) | ORAL | Status: DC | PRN

## 2013-05-16 NOTE — Patient Instructions (Addendum)
See Shirlee Limerick about your referral before you leave today. Take chlorpheniramine (SB CHLORPHENIRAMINE) 4 MG tablet twice a day for the runny nose.  See if that helps.   Use the albuterol if needed for now.  I'll ask for pulmonary's input on the other inhalers.

## 2013-05-17 ENCOUNTER — Encounter: Payer: Self-pay | Admitting: Family Medicine

## 2013-05-17 NOTE — Progress Notes (Signed)
H/o COPD, smoking hx noted.   Cough and dyspnea are the issues for today.   Was on symbicort but throat irritation seemed to worsen and his cough increased.  Stopping the symbicort caused a reduction in cough but he gets more SOB.  Has been able to use SABA with transient relief, with less wheeze after use.  He was on spiriva prev w/o much difference noted by patient.  Spirometry results noted today, d/w pt.    He continues to have rhinorrhea, clear, going on for years in spite of treatment and prev ENT eval. No meds have had much effect so far.  This likely causes throat clearing, frequent, annoying to patient.  Saline rinses don't help.  He asks for advice.   PMH and SH reviewed  ROS: See HPI, otherwise noncontributory.  Meds, vitals, and allergies reviewed.   nad Tm wnl Nasal exam with clear rhinorrhea OP wnl, mmm Neck supple, no LA rrr Ctab except for global dec in BS (exam after SABA use here in clinic), no wheeze, no inc in wob abd soft

## 2013-05-17 NOTE — Assessment & Plan Note (Signed)
Would add on chlorpheniramine for now and then f/u with pulm about this.  I appreciate pulm help.

## 2013-05-17 NOTE — Assessment & Plan Note (Signed)
I'm unclear about what med to add on, as I would expect other steroid inhalers to cause a return of the cough.  Spirometry results d/w pt.  He did have a reverseable component, so would continue SABA prn for now and refer to pulm.  He agrees. CXR reviewed.

## 2013-05-18 DIAGNOSIS — J449 Chronic obstructive pulmonary disease, unspecified: Secondary | ICD-10-CM | POA: Diagnosis not present

## 2013-05-18 MED ORDER — ALBUTEROL SULFATE (2.5 MG/3ML) 0.083% IN NEBU
2.5000 mg | INHALATION_SOLUTION | Freq: Once | RESPIRATORY_TRACT | Status: AC
  Administered 2013-05-18: 2.5 mg via RESPIRATORY_TRACT

## 2013-05-18 NOTE — Addendum Note (Signed)
Addended by: Annamarie Major on: 05/18/2013 10:29 AM   Modules accepted: Orders

## 2013-05-23 ENCOUNTER — Institutional Professional Consult (permissible substitution): Payer: Medicare Other | Admitting: Pulmonary Disease

## 2013-05-29 DIAGNOSIS — M999 Biomechanical lesion, unspecified: Secondary | ICD-10-CM | POA: Diagnosis not present

## 2013-05-29 DIAGNOSIS — M62838 Other muscle spasm: Secondary | ICD-10-CM | POA: Diagnosis not present

## 2013-05-29 DIAGNOSIS — M5137 Other intervertebral disc degeneration, lumbosacral region: Secondary | ICD-10-CM | POA: Diagnosis not present

## 2013-06-05 ENCOUNTER — Ambulatory Visit (INDEPENDENT_AMBULATORY_CARE_PROVIDER_SITE_OTHER): Payer: Medicare Other | Admitting: Pulmonary Disease

## 2013-06-05 ENCOUNTER — Other Ambulatory Visit: Payer: Self-pay | Admitting: Family Medicine

## 2013-06-05 ENCOUNTER — Encounter: Payer: Self-pay | Admitting: Pulmonary Disease

## 2013-06-05 VITALS — BP 94/60 | HR 74 | Temp 98.0°F | Ht 69.0 in | Wt 216.8 lb

## 2013-06-05 DIAGNOSIS — J449 Chronic obstructive pulmonary disease, unspecified: Secondary | ICD-10-CM | POA: Diagnosis not present

## 2013-06-05 DIAGNOSIS — R05 Cough: Secondary | ICD-10-CM | POA: Diagnosis not present

## 2013-06-05 MED ORDER — IPRATROPIUM-ALBUTEROL 18-103 MCG/ACT IN AERO: 1.0000 | INHALATION_SPRAY | Freq: Four times a day (QID) | RESPIRATORY_TRACT | Status: DC | PRN

## 2013-06-05 MED ORDER — HYDROCOD POLST-CHLORPHEN POLST 10-8 MG/5ML PO LQCR: 5.0000 mL | Freq: Every evening | ORAL | Status: DC | PRN

## 2013-06-05 NOTE — Telephone Encounter (Signed)
Electronic refill request.  Please advise. 
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
## 2013-06-05 NOTE — Assessment & Plan Note (Signed)
Christopher Santiago's biggest complaint to me today is his cough. He attributes this to "a constant flow" from his nose down into his throat. He says that nothing is really ever made this better.  While the COPD with some component of chronic bronchitis could be contributing to his cough, I think that the most likely etiology is the postnasal drip.  I explained to him that I want to first and foremost to eliminate the postnasal drip and if he still has cough after that then we could try using inhalers. See my discussion of COPD below.  Plan: -Stop chlorpheniramine -Use loratadine regularly -Use [name] med sinus rinse daily -Start Nasacort 2 puffs each nostril twice a day -Use over-the-counter decongestant

## 2013-06-05 NOTE — Assessment & Plan Note (Signed)
Christopher Santiago clearly has COPD based on his urine reversible airflow obstruction on office spirometry. However, his airflow obstruction is moderate and he does not have significant symptoms in that his dyspnea is only mild. He cycles regularly and does not feel that the COPD limits his ability to cycle for 30-40 miles at a time. Further, he does not have recurrent exacerbations of COPD. Based on this he is a GOLD grade A, and it's not really clear from the evidence that these people need to be treated with long acting bronchodilators.  Since his primary problem is cough, and she's had problems with cough being exacerbated by inhalers in the past I do recommend that he not use any long acting bronchodilators or corticosteroids right now.  We are going to focus our therapy on his sinus congestion and postnasal drip and if he still has cough after these problems are eliminated then we'll consider adding regular, long-acting medicines.  Sometimes people like [name] will do better with Combivent than albuterol so I recommended that he change from the PRN albuterol to Combivent.

## 2013-06-05 NOTE — Telephone Encounter (Signed)
Medication phoned to pharmacy.  

## 2013-06-05 NOTE — Patient Instructions (Signed)
Stop taking the chlorpheniramine  Start taking loratidine regularly  Take nasacort 2 sprays each nostril twice a day  Use Neil med rinses at least once per day  Use phenylephrine (over the counter decongestant) as needed for sinus congestion  Use the combivent respimat inhaler instead of the albuterol  We will see you back in 4-6 weeks or sooner if needed

## 2013-06-05 NOTE — Telephone Encounter (Signed)
Please call in.  Thanks.   

## 2013-06-19 ENCOUNTER — Other Ambulatory Visit: Payer: Self-pay

## 2013-06-19 NOTE — Telephone Encounter (Signed)
Pt left v/m requesting refill citalopram,benicar and pravastatin to [facility]; benicar and pravastatin are not on current med list; 02/02/13 office note said to stay off statin for now. Left v/m for pt to cb.

## 2013-06-27 ENCOUNTER — Ambulatory Visit (INDEPENDENT_AMBULATORY_CARE_PROVIDER_SITE_OTHER): Payer: Medicare Other | Admitting: Cardiovascular Disease

## 2013-06-27 ENCOUNTER — Encounter: Payer: Self-pay | Admitting: Cardiovascular Disease

## 2013-06-27 ENCOUNTER — Other Ambulatory Visit: Payer: Self-pay | Admitting: Family Medicine

## 2013-06-27 VITALS — BP 147/76 | HR 68 | Ht 70.0 in | Wt 222.5 lb

## 2013-06-27 DIAGNOSIS — I10 Essential (primary) hypertension: Secondary | ICD-10-CM | POA: Diagnosis not present

## 2013-06-27 DIAGNOSIS — E7800 Pure hypercholesterolemia, unspecified: Secondary | ICD-10-CM

## 2013-06-27 DIAGNOSIS — I2510 Atherosclerotic heart disease of native coronary artery without angina pectoris: Secondary | ICD-10-CM | POA: Diagnosis not present

## 2013-06-27 DIAGNOSIS — E669 Obesity, unspecified: Secondary | ICD-10-CM

## 2013-06-27 DIAGNOSIS — I739 Peripheral vascular disease, unspecified: Secondary | ICD-10-CM

## 2013-06-27 NOTE — Assessment & Plan Note (Signed)
Adequate range. We'll monitor this for now

## 2013-06-27 NOTE — Progress Notes (Signed)
Patient ID: Christopher Santiago, male    DOB: 21-Apr-1931, 79 y.o.   MRN: [id number]  HPI Comments: Mr. Pritchard is a  79 year old man with a history of diffuse moderate three-vessel coronary artery disease cardiac catheterization in 2006, PVD of the LE, hypertension, hyperlipidemia, diabetes, PAD, and COPD.  He returns today for routine followup.    He smoked for 50 years, stopping over 10 years ago.  History of Sleep apnea and uses CPAP.  He has peripheral vascular disease with blockage in his left lower extremity.   He has insisted on weaning himself off his medications . He is no longer on a statin, no beta blocker or ACE inhibitor . He only takes an aspirin for his cardiac regiment . He does not want additional medications . He has tried pravastatin and Crestor in the past He plans on spending 3 months driving across country starting in late August  Hemoglobin A1c 6.4, total cholesterol 159, HDL 92  EKG shows normal sinus rhythm with rate 68 beats per minute with right bundle branch block, left anterior fascicular block    CORONARY ANGIOGRAPHY: In 2006, November  .  The left anterior descending artery was a long vessel wrapping the apex.      It gave off 2 small proximal diagonals. In the proximal LAD there was a      tubular 30-40% lesion. In the proximal to mid LAD, right at the takeoff      of the second diagonal, there was a 50% tubular lesion. The mid LAD      there was a 40% tubular lesion. In the lower branch of the first      diagonal there is a 99% lesion with subtotal occlusion of the vessel.  3.  The left circumflex was a large vessel. It gave off a large branching      ramus, a large OM-1 and moderate-to-large OM-2 and OM-3. In the mid      circumflex there was a 30% lesion, followed by a 50-60% lesion at the      takeoff of the OM-2. Distally there was a 30% lesion. In the upper      branch of a large ramus there was a 60-70% lesion. In the OM-1 there was      a 50% mid  portion, followed by a 70-80% tubular lesion. In the OM-2      there was a 50% proximal lesion.  4.  The right coronary artery was a dominant vessel. There was a 40%      proximally, which possibly related to catheter induced spasm -- though      it did not change much with nitroglycerin throughout the proximal      portion. There were multiple irregularities about 30-40% stenoses. The      mid portion, after the takeoff the RV branch, there was a 40% stenosis.      Distally, prior to the takeoff of the PDA, there was 40% stenosis. There      was a moderate size PDA and 2 small PLs.       Outpatient Encounter Prescriptions as of 06/27/2013  Medication Sig Dispense Refill  . albuterol-ipratropium (COMBIVENT) 18-103 MCG/ACT inhaler Inhale 1 puff into the lungs every 6 (six) hours as needed for wheezing.  1 Inhaler  3  . aspirin 81 MG EC tablet Take 81 mg by mouth daily.        . budesonide (RHINOCORT AQUA) 32 MCG/ACT nasal spray  Place 1 spray into the nose daily as needed.      . chlorpheniramine (SB CHLORPHENIRAMINE) 4 MG tablet Take 1 tablet (4 mg total) by mouth 2 (two) times daily as needed for allergies.  30 tablet  1  . chlorpheniramine-HYDROcodone (TUSSIONEX PENNKINETIC ER) 10-8 MG/5ML LQCR Take 5 mLs by mouth at bedtime as needed.  180 mL  0  . Cholecalciferol (VITAMIN D3) 1000 UNITS tablet Take 1,000 Units by mouth daily.        . citalopram (CELEXA) 40 MG tablet 1 tab every other day      . glucose blood test strip Check blood sugar 3 times a week or as needed.  50 each  3  . magnesium oxide (MAG-OX) 400 MG tablet Take 400 mg by mouth daily.      . meloxicam (MOBIC) 7.5 MG tablet Take 7.5 mg by mouth daily as needed.      . sildenafil (VIAGRA) 100 MG tablet Take 100 mg by mouth daily as needed.        . Tamsulosin HCl (FLOMAX) 0.4 MG CAPS Take 1 capsule daily  120 capsule  3  . testosterone cypionate (DEPOTESTOTERONE CYPIONATE) 200 MG/ML injection Inject 200 mg into the muscle every  14 (fourteen) days.        Marland Kitchen thyroid (ARMOUR) 30 MG tablet Take 15 mg by mouth daily.       . [DISCONTINUED] allopurinol (ZYLOPRIM) 100 MG tablet Take 2 tablets (200 mg total) by mouth daily.  180 tablet  1   No facility-administered encounter medications on file as of 06/27/2013.     Review of Systems  Constitutional: Negative.   HENT: Negative.   Eyes: Negative.   Respiratory: Negative.   Cardiovascular: Negative.   Gastrointestinal: Negative.   Musculoskeletal: Positive for myalgias and back pain.  Skin: Negative.   Neurological: Negative.   Psychiatric/Behavioral: Negative.   All other systems reviewed and are negative.    BP 147/76  Pulse 68  Ht 5\' 10"  (1.778 m)  Wt 222 lb 8 oz (100.925 kg)  BMI 31.93 kg/m2  Physical Exam  Nursing note and vitals reviewed. Constitutional: He is oriented to person, place, and time. He appears well-developed and well-nourished.  HENT:  Head: Normocephalic.  Nose: Nose normal.  Mouth/Throat: Oropharynx is clear and moist.  Eyes: Conjunctivae are normal. Pupils are equal, round, and reactive to light.  Neck: Normal range of motion. Neck supple. No JVD present.  Cardiovascular: Normal rate, regular rhythm, S1 normal, S2 normal and intact distal pulses.  Exam reveals no gallop and no friction rub.   Murmur heard.  Crescendo systolic murmur is present with a grade of 2/6  Pulmonary/Chest: Effort normal and breath sounds normal. No respiratory distress. He has no wheezes. He has no rales. He exhibits no tenderness.  Abdominal: Soft. Bowel sounds are normal. He exhibits no distension. There is no tenderness.  Musculoskeletal: Normal range of motion. He exhibits no edema and no tenderness.  Lymphadenopathy:    He has no cervical adenopathy.  Neurological: He is alert and oriented to person, place, and time. Coordination normal.  Skin: Skin is warm and dry. No rash noted. No erythema.  Psychiatric: He has a normal mood and affect. His behavior  is normal. Judgment and thought content normal.      Assessment and Plan

## 2013-06-27 NOTE — Assessment & Plan Note (Signed)
known lower extremity arterial disease. Does not have symptoms of claudication at this time

## 2013-06-27 NOTE — Assessment & Plan Note (Signed)
Encouraged him to try red yeast rice. He does not want a statin

## 2013-06-27 NOTE — Telephone Encounter (Signed)
Pt had weaned off statin and ARB.  See cards note.  [facility] sent. Thanks.

## 2013-06-27 NOTE — Assessment & Plan Note (Signed)
We have encouraged continued exercise, careful diet management in an effort to lose weight. 

## 2013-06-27 NOTE — Patient Instructions (Addendum)
You are doing well. No medication changes were made.  Consider Red Yeast Rice 1 to 4 a day for cholesterol  Please call us if you have new issues that need to be addressed before your next appt.  Your physician wants you to follow-up in: 6 months.  You will receive a reminder letter in the mail two months in advance. If you don't receive a letter, please call our office to schedule the follow-up appointment.

## 2013-06-27 NOTE — Assessment & Plan Note (Signed)
He denies any symptoms concerning for worsening coronary disease/angina. He does not want any additional medications. He has weaned himself off all of his cardiac medications and only takes aspirin. Cholesterol is above goal. We did discuss possibly taking red yeast rice. He we'll consider this. Ideally he should be on low-dose beta blocker, low-dose ACE inhibitor and a statin.

## 2013-06-27 NOTE — Telephone Encounter (Signed)
Electronic refill request.  I don't even see the Pravastatin and Benicar on his meds list.  Please advise.

## 2013-07-03 DIAGNOSIS — M62838 Other muscle spasm: Secondary | ICD-10-CM | POA: Diagnosis not present

## 2013-07-03 DIAGNOSIS — M999 Biomechanical lesion, unspecified: Secondary | ICD-10-CM | POA: Diagnosis not present

## 2013-07-03 DIAGNOSIS — M5137 Other intervertebral disc degeneration, lumbosacral region: Secondary | ICD-10-CM | POA: Diagnosis not present

## 2013-07-04 ENCOUNTER — Telehealth: Payer: Self-pay | Admitting: *Deleted

## 2013-07-04 MED ORDER — OLMESARTAN MEDOXOMIL 20 MG PO TABS: ORAL_TABLET | ORAL | Status: DC

## 2013-07-04 MED ORDER — PRAVASTATIN SODIUM 20 MG PO TABS: 20.0000 mg | ORAL_TABLET | Freq: Every day | ORAL | Status: DC

## 2013-07-04 NOTE — Telephone Encounter (Signed)
Both sent.  Thanks.  

## 2013-07-04 NOTE — Telephone Encounter (Signed)
Advised patient that scripts have been sent to pharmacy.

## 2013-07-04 NOTE — Telephone Encounter (Signed)
Pt is asking for refills on benicar and pravastatin.  He states he has been off pravastatin due to some pains he was having, but going off the medicine didn't help, so, he wants to start back.  Per card note pt was off both meds, but pt states that's a mistake.  He is going on an extended vacation and needs enough medicine to last for 3 months.  OK to refill, or should patient check with cardiologist? Please advise.

## 2013-07-11 ENCOUNTER — Encounter: Payer: Self-pay | Admitting: Pulmonary Disease

## 2013-07-11 ENCOUNTER — Ambulatory Visit (INDEPENDENT_AMBULATORY_CARE_PROVIDER_SITE_OTHER): Payer: Medicare Other | Admitting: Pulmonary Disease

## 2013-07-11 VITALS — BP 124/62 | HR 80 | Temp 98.4°F | Ht 70.0 in | Wt 228.0 lb

## 2013-07-11 DIAGNOSIS — R059 Cough, unspecified: Secondary | ICD-10-CM

## 2013-07-11 DIAGNOSIS — J449 Chronic obstructive pulmonary disease, unspecified: Secondary | ICD-10-CM

## 2013-07-11 DIAGNOSIS — R05 Cough: Secondary | ICD-10-CM

## 2013-07-11 MED ORDER — IPRATROPIUM BROMIDE 0.03 % NA SOLN: 2.0000 | Freq: Four times a day (QID) | NASAL | Status: DC
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
## 2013-07-11 NOTE — Assessment & Plan Note (Signed)
Due to post nasal drip.  Improved significantly but still present.  Likely some component of vasomotor rhinitis contributing.  Plan: -add ipratropium nasal spray -phenylephrine decongestant prn -continue nasacort, neil med -consider [facility] referral

## 2013-07-11 NOTE — Assessment & Plan Note (Signed)
Christopher Santiago is doing well from this standpoint.  Plan: -qid combivent on days when he is more active, otherwise prn -flu shot -f/u 6 months

## 2013-07-11 NOTE — Patient Instructions (Signed)
Take the combivent four times a day when you are going to be exercising on vacation, otherwise use it as you are doing Take the ipratropium nasal spray as needed for the runny nose in the afternoons Keep using your other sinus medicines You can use phenylephrine or pseudophed as needed for sinus congestion and post nasal drip  We will see you back in 6 months or sooner if needed

## 2013-07-14 ENCOUNTER — Other Ambulatory Visit: Payer: Self-pay | Admitting: Family Medicine

## 2013-07-14 DIAGNOSIS — E119 Type 2 diabetes mellitus without complications: Secondary | ICD-10-CM

## 2013-07-17 ENCOUNTER — Other Ambulatory Visit (INDEPENDENT_AMBULATORY_CARE_PROVIDER_SITE_OTHER): Payer: Medicare Other

## 2013-07-17 DIAGNOSIS — E119 Type 2 diabetes mellitus without complications: Secondary | ICD-10-CM | POA: Diagnosis not present

## 2013-07-19 ENCOUNTER — Other Ambulatory Visit: Payer: Medicare Other

## 2013-07-19 NOTE — Telephone Encounter (Signed)
See 06/27/13 phone note.

## 2013-07-20 ENCOUNTER — Encounter: Payer: Self-pay | Admitting: Family Medicine

## 2013-07-20 ENCOUNTER — Ambulatory Visit (INDEPENDENT_AMBULATORY_CARE_PROVIDER_SITE_OTHER): Payer: Medicare Other | Admitting: Family Medicine

## 2013-07-20 VITALS — BP 154/80 | HR 98 | Temp 98.1°F | Wt 217.5 lb

## 2013-07-20 DIAGNOSIS — I798 Other disorders of arteries, arterioles and capillaries in diseases classified elsewhere: Secondary | ICD-10-CM

## 2013-07-20 DIAGNOSIS — E1159 Type 2 diabetes mellitus with other circulatory complications: Secondary | ICD-10-CM | POA: Diagnosis not present

## 2013-07-20 DIAGNOSIS — E1151 Type 2 diabetes mellitus with diabetic peripheral angiopathy without gangrene: Secondary | ICD-10-CM

## 2013-07-20 NOTE — Patient Instructions (Addendum)
Have a good trip. Call if questions.  Take care.   Recheck in 3/15 at a physical.  Labs ahead of time.

## 2013-07-20 NOTE — Progress Notes (Signed)
Diabetes:  No meds.   Hypoglycemic episodes:no Hyperglycemic episodes:no Feet problems:no Blood Sugars averaging: not checked.  He is exercising.   A1c improved.  Now <6 off meds.   H/o L lower leg fx and pins.  He is still functional, but it interferes with cycling.  He is considering cycle/pedal options.  We talked about options.  It is worse before he gets up and moving, ie worse when sedentary.    BP was controlled recently at pulm visit.     Meds, vitals, and allergies reviewed.   ROS: See HPI.  Otherwise negative.    GEN: nad, alert and oriented HEENT: mucous membranes moist NECK: supple w/o LA CV: rrr. PULM: ctab except for scant wheeze, no inc wob ABD: soft, +bs EXT: no edema SKIN: no acute rash

## 2013-07-21 NOTE — Assessment & Plan Note (Signed)
A1c at goal, continue diet and exercise.  Labs d/w pt.

## 2013-07-25 ENCOUNTER — Ambulatory Visit: Payer: Medicare Other | Admitting: Family Medicine

## 2013-08-03 ENCOUNTER — Other Ambulatory Visit: Payer: Medicare Other

## 2013-08-07 ENCOUNTER — Ambulatory Visit: Payer: Medicare Other | Admitting: Family Medicine

## 2013-09-28 ENCOUNTER — Other Ambulatory Visit: Payer: Self-pay

## 2013-10-16 DIAGNOSIS — M999 Biomechanical lesion, unspecified: Secondary | ICD-10-CM | POA: Diagnosis not present

## 2013-10-16 DIAGNOSIS — M62838 Other muscle spasm: Secondary | ICD-10-CM | POA: Diagnosis not present

## 2013-10-16 DIAGNOSIS — M5137 Other intervertebral disc degeneration, lumbosacral region: Secondary | ICD-10-CM | POA: Diagnosis not present

## 2013-10-17 ENCOUNTER — Ambulatory Visit: Payer: Medicare Other | Admitting: Family Medicine

## 2013-10-18 ENCOUNTER — Ambulatory Visit: Payer: Medicare Other | Admitting: Family Medicine

## 2013-10-26 ENCOUNTER — Ambulatory Visit (INDEPENDENT_AMBULATORY_CARE_PROVIDER_SITE_OTHER): Payer: Medicare Other | Admitting: Family Medicine

## 2013-10-26 ENCOUNTER — Encounter: Payer: Self-pay | Admitting: Family Medicine

## 2013-10-26 ENCOUNTER — Ambulatory Visit (INDEPENDENT_AMBULATORY_CARE_PROVIDER_SITE_OTHER)
Admission: RE | Admit: 2013-10-26 | Discharge: 2013-10-26 | Disposition: A | Discharge status: Home / Self Care | Payer: Medicare Other | Source: Ambulatory Visit | Attending: Family Medicine | Admitting: Family Medicine

## 2013-10-26 VITALS — BP 132/76 | HR 62 | Temp 97.7°F | Wt 221.5 lb

## 2013-10-26 DIAGNOSIS — J449 Chronic obstructive pulmonary disease, unspecified: Secondary | ICD-10-CM

## 2013-10-26 DIAGNOSIS — S82899A Other fracture of unspecified lower leg, initial encounter for closed fracture: Secondary | ICD-10-CM

## 2013-10-26 DIAGNOSIS — M25579 Pain in unspecified ankle and joints of unspecified foot: Secondary | ICD-10-CM

## 2013-10-26 DIAGNOSIS — Z23 Encounter for immunization: Secondary | ICD-10-CM

## 2013-10-26 DIAGNOSIS — N400 Enlarged prostate without lower urinary tract symptoms: Secondary | ICD-10-CM | POA: Diagnosis not present

## 2013-10-26 DIAGNOSIS — R197 Diarrhea, unspecified: Secondary | ICD-10-CM

## 2013-10-26 DIAGNOSIS — M25572 Pain in left ankle and joints of left foot: Secondary | ICD-10-CM

## 2013-10-26 MED ORDER — ALBUTEROL SULFATE HFA 108 (90 BASE) MCG/ACT IN AERS: 2.0000 | INHALATION_SPRAY | Freq: Four times a day (QID) | RESPIRATORY_TRACT | Status: DC | PRN

## 2013-10-26 NOTE — Assessment & Plan Note (Signed)
With epesiodic pain.  See notes on films.  No recent trauma.  He has f/u with ortho pending.

## 2013-10-26 NOTE — Patient Instructions (Signed)
Go to the lab on the way out.  We'll contact you with your xray report. Stop the magnesium for now.  Let me now if the diarrhea improves.  When we have that settled, we'll likely increase the flomax to 2 pills a day. If that makes the runny nose worse, then let me know.   Keep the appointment ortho and urology in the meantime.  Take care.

## 2013-10-26 NOTE — Assessment & Plan Note (Signed)
No travel or exotic foods.  Could be from magnesium.  Stop for now and report back as needed.  He agrees.  We'll address this first we didn't proceed with other w/u as he has no black/bloody stools and no abd pain.  He agrees.

## 2013-10-26 NOTE — Assessment & Plan Note (Signed)
Will likely inc flomax to 0.8mg  a day after the diarrhea is sorted out.  Inc in flomax may make the rhinorrhea worse.  This was discussed.  He has f/u with uro pending.

## 2013-10-26 NOTE — Assessment & Plan Note (Signed)
Dec in wheeze with SABA today.  ctab today.  Would continue PRN SABA.  He agrees.

## 2013-10-26 NOTE — Progress Notes (Signed)
Pre-visit discussion using our clinic review tool. No additional management support is needed unless otherwise documented below in the visit note.  H/o L ankle fx, 13 pins placed at the time. Intermittent L ankle pain.  He'll get episodic pain that causes him not to be able to bear weight.  The pain is proximal to the med and lat mal. He hasn't fallen, but has had to catch himself. He had seen Dr. Jessee Avers with ortho; he is going to see Dr. Lequita Halt soon. He has a limp from the ankle pain now. The ankle pain is worse recently.    He has had B hip pain, this isn't new.  He didn't know if it was from bursitis or from his ankle pain.  See above re: ortho f/u.    Recently with diarrhea.  Fecal urgency.  Started about 3 weeks ago. No solid stools in the meantime. Food changes don't seem to matter. He feels well o/w. No blood in stool. No black stools. Up to 3 BMs a day, this is more than normal. He is on magnesium.   He has been dribbling with urine after urination, flomax isn't helping. It is progressive.  His stream is weaker than prev.  He feels like he isn't completely emptying his bladder.  He has urinary frequency.  He has f/u with uro pending.  He does have some rhinorrhea.    No wheeze now. Used SABA this AM for wheeze.  He hasn't used the SABA frequently, today was the first time in weeks at least.   Meds, vitals, and allergies reviewed.   ROS: See HPI.  Otherwise, noncontributory.  nad ncat Rhinorrhea noted rrr ctab abd soft, not ttp, normal BS Ext without edema Mildly ttp just proximal to the medial>lateral mal on the L ankle.  Mortise intact No weakness with int/ext rotation, dorsi/plantar flexion.  No bruising on the ankle

## 2013-10-30 DIAGNOSIS — M62838 Other muscle spasm: Secondary | ICD-10-CM | POA: Diagnosis not present

## 2013-10-30 DIAGNOSIS — M999 Biomechanical lesion, unspecified: Secondary | ICD-10-CM | POA: Diagnosis not present

## 2013-10-30 DIAGNOSIS — M5137 Other intervertebral disc degeneration, lumbosacral region: Secondary | ICD-10-CM | POA: Diagnosis not present

## 2013-10-31 ENCOUNTER — Encounter: Payer: Self-pay | Admitting: Family Medicine

## 2013-11-07 ENCOUNTER — Encounter: Payer: Self-pay | Admitting: Family Medicine

## 2013-11-08 ENCOUNTER — Telehealth: Payer: Self-pay

## 2013-11-08 DIAGNOSIS — M545 Low back pain: Secondary | ICD-10-CM | POA: Diagnosis not present

## 2013-11-08 NOTE — Telephone Encounter (Signed)
Okay to try once.  Would use the lowest dose, shortest course possible.  Have pt monitor glucose daily in AM.  It will likely go up with the start of the prednisone and then should gradually trend down back to normal.  Thanks.

## 2013-11-08 NOTE — Telephone Encounter (Signed)
Faith notified as instructed by telephone.

## 2013-11-08 NOTE — Telephone Encounter (Signed)
Faith with Dr Lucio's office left v/m; pt was seen today by PA and pt mentioned diabetic that is controlled by diet. PA wants to know if OK to give pt a prednisone dose pack. Faith request cb.

## 2013-11-21 ENCOUNTER — Other Ambulatory Visit: Payer: Self-pay | Admitting: Family Medicine

## 2013-11-23 HISTORY — PX: VENA CAVA FILTER PLACEMENT: SUR1032

## 2013-11-28 ENCOUNTER — Other Ambulatory Visit: Payer: Self-pay | Admitting: Family Medicine

## 2013-11-28 MED ORDER — HYDROCOD POLST-CHLORPHEN POLST 10-8 MG/5ML PO LQCR: 5.0000 mL | Freq: Every evening | ORAL | Status: DC | PRN

## 2013-11-28 NOTE — Telephone Encounter (Signed)
MyChart refill request.  Dr. Damita Dunnings is not here this morning and may or may not be here this afternoon.  Will you agree to refill and print for pick up?

## 2013-11-28 NOTE — Telephone Encounter (Signed)
This Rx has to be printed due to it containing hydrocodone. If you do so, I will contact the pt for them to pick up

## 2013-11-28 NOTE — Telephone Encounter (Signed)
Rx printed and signed. Spoke to pt and informed him that Rx is available for pickup at the front desk;informed gov't issued photo id required for pickup

## 2013-11-30 DIAGNOSIS — M545 Low back pain, unspecified: Secondary | ICD-10-CM | POA: Diagnosis not present

## 2013-12-08 DIAGNOSIS — Z961 Presence of intraocular lens: Secondary | ICD-10-CM | POA: Diagnosis not present

## 2013-12-08 DIAGNOSIS — H3500 Unspecified background retinopathy: Secondary | ICD-10-CM | POA: Diagnosis not present

## 2013-12-08 DIAGNOSIS — H01009 Unspecified blepharitis unspecified eye, unspecified eyelid: Secondary | ICD-10-CM | POA: Diagnosis not present

## 2013-12-08 DIAGNOSIS — E119 Type 2 diabetes mellitus without complications: Secondary | ICD-10-CM | POA: Diagnosis not present

## 2013-12-19 DIAGNOSIS — M48061 Spinal stenosis, lumbar region without neurogenic claudication: Secondary | ICD-10-CM | POA: Diagnosis not present

## 2013-12-20 ENCOUNTER — Encounter: Payer: Self-pay | Admitting: Internal Medicine

## 2013-12-20 DIAGNOSIS — N401 Enlarged prostate with lower urinary tract symptoms: Secondary | ICD-10-CM | POA: Diagnosis not present

## 2013-12-20 DIAGNOSIS — E291 Testicular hypofunction: Secondary | ICD-10-CM | POA: Diagnosis not present

## 2013-12-25 ENCOUNTER — Encounter: Payer: Self-pay | Admitting: Family Medicine

## 2013-12-25 DIAGNOSIS — N529 Male erectile dysfunction, unspecified: Secondary | ICD-10-CM | POA: Diagnosis not present

## 2013-12-25 DIAGNOSIS — E291 Testicular hypofunction: Secondary | ICD-10-CM | POA: Diagnosis not present

## 2013-12-25 DIAGNOSIS — N3941 Urge incontinence: Secondary | ICD-10-CM | POA: Diagnosis not present

## 2013-12-25 DIAGNOSIS — N401 Enlarged prostate with lower urinary tract symptoms: Secondary | ICD-10-CM | POA: Diagnosis not present

## 2013-12-25 DIAGNOSIS — N138 Other obstructive and reflux uropathy: Secondary | ICD-10-CM | POA: Diagnosis not present

## 2013-12-26 DIAGNOSIS — M545 Low back pain, unspecified: Secondary | ICD-10-CM | POA: Diagnosis not present

## 2013-12-27 ENCOUNTER — Telehealth: Payer: Self-pay | Admitting: Family Medicine

## 2013-12-27 NOTE — Telephone Encounter (Signed)
Please call pt.  I talked with Dr. Carlean Purl.  Per Dr. Carlean Purl:  At 80 it is okay to skip the colonoscopy.  His concern about the repeat was the size and type of polyps he has had and thought one more could be reasonable.  He thanked me for asking about this.  If pt does not want to proceed that is fine.  If he wants to discuss with Dr. Carlean Purl face to face, the I would have him call GI about an appointment.  This is not a mandatory thing and neither I nor Dr. Carlean Purl are pushing hard one way or the other.  Neither of [place] will be upset with any decision the patient makes.  Thanks.

## 2013-12-28 ENCOUNTER — Other Ambulatory Visit: Payer: Self-pay | Admitting: *Deleted

## 2013-12-28 ENCOUNTER — Encounter: Payer: Self-pay | Admitting: Family Medicine

## 2013-12-28 ENCOUNTER — Other Ambulatory Visit: Payer: Self-pay | Admitting: Family Medicine

## 2013-12-28 MED ORDER — ALBUTEROL SULFATE HFA 108 (90 BASE) MCG/ACT IN AERS: 2.0000 | INHALATION_SPRAY | Freq: Four times a day (QID) | RESPIRATORY_TRACT | Status: DC | PRN

## 2013-12-28 NOTE — Telephone Encounter (Signed)
Erroneous encounter

## 2014-01-04 ENCOUNTER — Ambulatory Visit (INDEPENDENT_AMBULATORY_CARE_PROVIDER_SITE_OTHER): Payer: Medicare Other | Admitting: Cardiovascular Disease

## 2014-01-04 ENCOUNTER — Encounter: Payer: Self-pay | Admitting: Cardiovascular Disease

## 2014-01-04 VITALS — BP 120/74 | HR 75 | Ht 69.5 in | Wt 214.8 lb

## 2014-01-04 DIAGNOSIS — I798 Other disorders of arteries, arterioles and capillaries in diseases classified elsewhere: Secondary | ICD-10-CM

## 2014-01-04 DIAGNOSIS — R5381 Other malaise: Secondary | ICD-10-CM | POA: Diagnosis not present

## 2014-01-04 DIAGNOSIS — I2510 Atherosclerotic heart disease of native coronary artery without angina pectoris: Secondary | ICD-10-CM | POA: Diagnosis not present

## 2014-01-04 DIAGNOSIS — I10 Essential (primary) hypertension: Secondary | ICD-10-CM | POA: Diagnosis not present

## 2014-01-04 DIAGNOSIS — E1159 Type 2 diabetes mellitus with other circulatory complications: Secondary | ICD-10-CM

## 2014-01-04 DIAGNOSIS — E7800 Pure hypercholesterolemia, unspecified: Secondary | ICD-10-CM

## 2014-01-04 DIAGNOSIS — R5383 Other fatigue: Secondary | ICD-10-CM | POA: Diagnosis not present

## 2014-01-04 DIAGNOSIS — E1151 Type 2 diabetes mellitus with diabetic peripheral angiopathy without gangrene: Secondary | ICD-10-CM

## 2014-01-04 MED ORDER — THYROID 30 MG PO TABS: 30.0000 mg | ORAL_TABLET | Freq: Every day | ORAL | Status: DC

## 2014-01-04 NOTE — Progress Notes (Signed)
Patient ID: Christopher Santiago, male    DOB: 08-21-1931, 80 y.o.   MRN: [id number]  HPI Comments: Mr. Franek is a  80 year old man with a history of diffuse moderate three-vessel coronary artery disease cardiac catheterization in 2006, PVD of the LE, hypertension, hyperlipidemia, diabetes, PAD, and COPD.  He returns today for routine followup.    He smoked for 50 years, stopping over 10 years ago.  History of Sleep apnea and uses CPAP.  He has peripheral vascular disease with blockage in his left lower extremity.   In the past, he has insisted on weaning himself off his medications . He is no longer on a statin, no beta blocker or ACE inhibitor . He only takes an aspirin for his cardiac regiment .  He has tried pravastatin and Crestor in the past In followup today, he reports that he has fatigue. Wife reports that he is is lethargic in the morning, typically goes back to bed after waking up after his breakfast. Overall sleeps well with nocturia 2 times per night sometimes more. No energy in the afternoon. He reports that he stopped smoking 15 years ago. He has been going to the fitness Center 3 times per week, works out for one hour at a time uses a dozen or so machines Is uncertain if he is depressed, seasonal affective disorder, possibly from stopping his thyroid medication. Like to restart his thyroid medication. He does not do any aerobic activity secondary to his ankle  Hemoglobin A1c 6.4, total cholesterol 159, HDL 92  EKG shows normal sinus rhythm with rate 75 beats per minute with right bundle branch block, left anterior fascicular block   CORONARY ANGIOGRAPHY: In 2006, November  .  The left anterior descending artery was a long vessel wrapping the apex.      It gave off 2 small proximal diagonals. In the proximal LAD there was a      tubular 30-40% lesion. In the proximal to mid LAD, right at the takeoff      of the second diagonal, there was a 50% tubular lesion. The mid LAD  there was a 40% tubular lesion. In the lower branch of the first      diagonal there is a 99% lesion with subtotal occlusion of the vessel.  3.  The left circumflex was a large vessel. It gave off a large branching      ramus, a large OM-1 and moderate-to-large OM-2 and OM-3. In the mid      circumflex there was a 30% lesion, followed by a 50-60% lesion at the      takeoff of the OM-2. Distally there was a 30% lesion. In the upper      branch of a large ramus there was a 60-70% lesion. In the OM-1 there was      a 50% mid portion, followed by a 70-80% tubular lesion. In the OM-2      there was a 50% proximal lesion.  4.  The right coronary artery was a dominant vessel. There was a 40%      proximally, which possibly related to catheter induced spasm -- though      it did not change much with nitroglycerin throughout the proximal      portion. There were multiple irregularities about 30-40% stenoses. The      mid portion, after the takeoff the RV branch, there was a 40% stenosis.      Distally, prior to the takeoff of the  PDA, there was 40% stenosis. There      was a moderate size PDA and 2 small PLs.       Outpatient Encounter Prescriptions as of 01/04/2014  Medication Sig  . albuterol (PROVENTIL HFA;VENTOLIN HFA) 108 (90 BASE) MCG/ACT inhaler Inhale 2 puffs into the lungs every 6 (six) hours as needed for wheezing or shortness of breath.  . allopurinol (ZYLOPRIM) 100 MG tablet Take 100 mg by mouth 2 (two) times daily.  Marland Kitchen aspirin 81 MG EC tablet Take 81 mg by mouth daily.    . chlorpheniramine-HYDROcodone (TUSSIONEX PENNKINETIC ER) 10-8 MG/5ML LQCR Take 5 mLs by mouth at bedtime as needed.  . Cholecalciferol (VITAMIN D3) 1000 UNITS tablet Take 1,000 Units by mouth daily.    . citalopram (CELEXA) 40 MG tablet Take 40 mg by mouth daily.  Marland Kitchen glucose blood test strip Check blood sugar 3 times a week or as needed.  Marland Kitchen olmesartan (BENICAR) 20 MG tablet TAKE 1 TABLET DAILY  . pravastatin  (PRAVACHOL) 20 MG tablet Take 1 tablet (20 mg total) by mouth daily.  . tamsulosin (FLOMAX) 0.4 MG CAPS capsule Take 1 capsule by mouth  daily  . testosterone cypionate (DEPOTESTOTERONE CYPIONATE) 200 MG/ML injection Inject 200 mg into the muscle every 14 (fourteen) days.    Marland Kitchen thyroid (ARMOUR) 30 MG tablet Take 30 mg by mouth every other day.   . triamcinolone (NASACORT) 55 MCG/ACT nasal inhaler Place 2 sprays into the nose daily.    Review of Systems  Constitutional: Positive for fatigue.  HENT: Negative.   Eyes: Negative.   Respiratory: Positive for shortness of breath.   Cardiovascular: Negative.   Gastrointestinal: Negative.   Endocrine: Negative.   Musculoskeletal: Positive for back pain and myalgias.  Skin: Negative.   Allergic/Immunologic: Negative.   Neurological: Negative.   Hematological: Negative.   Psychiatric/Behavioral: Negative.   All other systems reviewed and are negative.    BP 120/74  Pulse 75  Ht 5' 9.5" (1.765 m)  Wt 214 lb 12 oz (97.41 kg)  BMI 31.27 kg/m2  Physical Exam  Nursing note and vitals reviewed. Constitutional: He is oriented to person, place, and time. He appears well-developed and well-nourished.  HENT:  Head: Normocephalic.  Nose: Nose normal.  Mouth/Throat: Oropharynx is clear and moist.  Eyes: Conjunctivae are normal. Pupils are equal, round, and reactive to light.  Neck: Normal range of motion. Neck supple. No JVD present.  Cardiovascular: Normal rate, regular rhythm, S1 normal, S2 normal and intact distal pulses.  Exam reveals no gallop and no friction rub.   Murmur heard.  Crescendo systolic murmur is present with a grade of 2/6  Pulmonary/Chest: Effort normal and breath sounds normal. No respiratory distress. He has no wheezes. He has no rales. He exhibits no tenderness.  Abdominal: Soft. Bowel sounds are normal. He exhibits no distension. There is no tenderness.  Musculoskeletal: Normal range of motion. He exhibits no edema and no  tenderness.  Lymphadenopathy:    He has no cervical adenopathy.  Neurological: He is alert and oriented to person, place, and time. Coordination normal.  Skin: Skin is warm and dry. No rash noted. No erythema.  Psychiatric: He has a normal mood and affect. His behavior is normal. Judgment and thought content normal.      Assessment and Plan

## 2014-01-04 NOTE — Assessment & Plan Note (Signed)
We have encouraged continued exercise, careful diet management in an effort to lose weight. 

## 2014-01-04 NOTE — Assessment & Plan Note (Signed)
Currently with no symptoms of angina. No further workup at this time. Continue current medication regimen. 

## 2014-01-04 NOTE — Assessment & Plan Note (Signed)
Blood pressure is well controlled on today's visit. No changes made to the medications. 

## 2014-01-04 NOTE — Patient Instructions (Signed)
Ok to restart throid medication daily Follow up with Dr. Lupita Dawn   Call for new symptoms of chest tightness, shortness of breath  Please call us if you have new issues that need to be addressed before your next appt.  Your physician wants you to follow-up in: 6 months.  You will receive a reminder letter in the mail two months in advance. If you don't receive a letter, please call our office to schedule the follow-up appointment.

## 2014-01-04 NOTE — Assessment & Plan Note (Signed)
Etiology of his lethargy is unclear. I suspect he has seasonal affective disorder, possible depression. He is concerned that his thyroid could be issue and is requesting to restart the thyroid medication. We have prescribed this for him and he will take this until he sees Dr. Damita Dunnings. If he has improvement of his symptoms, I suggested he talk with Dr. Damita Dunnings about how to proceed.   He denies any symptoms concerning for angina. No cardiac workup at this time. He does not feel his symptoms are from cardiac issues

## 2014-01-04 NOTE — Assessment & Plan Note (Signed)
In the past he has refused cholesterol medication as has tried to wean himself off most of his medications.

## 2014-01-07 ENCOUNTER — Encounter: Payer: Self-pay | Admitting: Family Medicine

## 2014-01-08 ENCOUNTER — Other Ambulatory Visit: Payer: Self-pay | Admitting: Family Medicine

## 2014-01-08 DIAGNOSIS — M5137 Other intervertebral disc degeneration, lumbosacral region: Secondary | ICD-10-CM | POA: Diagnosis not present

## 2014-01-08 DIAGNOSIS — M999 Biomechanical lesion, unspecified: Secondary | ICD-10-CM | POA: Diagnosis not present

## 2014-01-08 DIAGNOSIS — M62838 Other muscle spasm: Secondary | ICD-10-CM | POA: Diagnosis not present

## 2014-01-08 MED ORDER — CITALOPRAM HYDROBROMIDE 40 MG PO TABS: 40.0000 mg | ORAL_TABLET | Freq: Every day | ORAL | Status: DC

## 2014-01-08 NOTE — Telephone Encounter (Signed)
Patient advised.

## 2014-01-12 DIAGNOSIS — M545 Low back pain, unspecified: Secondary | ICD-10-CM | POA: Diagnosis not present

## 2014-01-12 DIAGNOSIS — M48061 Spinal stenosis, lumbar region without neurogenic claudication: Secondary | ICD-10-CM | POA: Diagnosis not present

## 2014-01-23 ENCOUNTER — Encounter: Payer: Self-pay | Admitting: Family Medicine

## 2014-01-24 ENCOUNTER — Other Ambulatory Visit: Payer: Self-pay | Admitting: Family Medicine

## 2014-01-24 DIAGNOSIS — E119 Type 2 diabetes mellitus without complications: Secondary | ICD-10-CM

## 2014-01-24 DIAGNOSIS — R61 Generalized hyperhidrosis: Secondary | ICD-10-CM

## 2014-01-24 DIAGNOSIS — M109 Gout, unspecified: Secondary | ICD-10-CM

## 2014-01-29 ENCOUNTER — Other Ambulatory Visit (INDEPENDENT_AMBULATORY_CARE_PROVIDER_SITE_OTHER): Payer: Medicare Other

## 2014-01-29 DIAGNOSIS — M109 Gout, unspecified: Secondary | ICD-10-CM | POA: Diagnosis not present

## 2014-01-29 DIAGNOSIS — R61 Generalized hyperhidrosis: Secondary | ICD-10-CM

## 2014-01-29 DIAGNOSIS — E119 Type 2 diabetes mellitus without complications: Secondary | ICD-10-CM

## 2014-01-29 LAB — CBC WITH DIFFERENTIAL/PLATELET
Basophils Absolute: 0 10*3/uL (ref 0.0–0.1)
Basophils Relative: 0.4 % (ref 0.0–3.0)
EOS ABS: 0.2 10*3/uL (ref 0.0–0.7)
Eosinophils Relative: 2.6 % (ref 0.0–5.0)
HCT: 47.1 % (ref 39.0–52.0)
Hemoglobin: 15.6 g/dL (ref 13.0–17.0)
Lymphocytes Relative: 18.5 % (ref 12.0–46.0)
Lymphs Abs: 1.2 10*3/uL (ref 0.7–4.0)
MCHC: 33 g/dL (ref 30.0–36.0)
MCV: 98 fl (ref 78.0–100.0)
Monocytes Absolute: 0.5 10*3/uL (ref 0.1–1.0)
Monocytes Relative: 8.1 % (ref 3.0–12.0)
NEUTROS PCT: 70.4 % (ref 43.0–77.0)
Neutro Abs: 4.7 10*3/uL (ref 1.4–7.7)
PLATELETS: 171 10*3/uL (ref 150.0–400.0)
RBC: 4.81 Mil/uL (ref 4.22–5.81)
RDW: 14.7 % — AB (ref 11.5–14.6)
WBC: 6.7 10*3/uL (ref 4.5–10.5)

## 2014-01-29 LAB — COMPREHENSIVE METABOLIC PANEL
ALT: 15 U/L (ref 0–53)
AST: 20 U/L (ref 0–37)
Albumin: 3.8 g/dL (ref 3.5–5.2)
Alkaline Phosphatase: 38 U/L — ABNORMAL LOW (ref 39–117)
BILIRUBIN TOTAL: 0.8 mg/dL (ref 0.3–1.2)
BUN: 17 mg/dL (ref 6–23)
CO2: 27 meq/L (ref 19–32)
CREATININE: 1.2 mg/dL (ref 0.4–1.5)
Calcium: 9.5 mg/dL (ref 8.4–10.5)
Chloride: 103 mEq/L (ref 96–112)
GFR: 64.87 mL/min (ref >60.00)
Glucose, Bld: 138 mg/dL — ABNORMAL HIGH (ref 70–99)
Potassium: 4.7 mEq/L (ref 3.5–5.1)
Sodium: 139 mEq/L (ref 135–145)
Total Protein: 7.2 g/dL (ref 6.0–8.3)

## 2014-01-29 LAB — LIPID PANEL
CHOLESTEROL: 145 mg/dL (ref 0–200)
HDL: 57.3 mg/dL (ref >39.00)
LDL Cholesterol: 78 mg/dL (ref 0–99)
Total CHOL/HDL Ratio: 3
Triglycerides: 50 mg/dL (ref 0.0–149.0)
VLDL: 10 mg/dL (ref 0.0–40.0)

## 2014-01-29 LAB — URIC ACID: Uric Acid, Serum: 4.8 mg/dL (ref 4.0–7.8)

## 2014-01-29 LAB — TSH: TSH: 1.25 u[IU]/mL (ref 0.35–5.50)

## 2014-01-29 LAB — HEMOGLOBIN A1C: HEMOGLOBIN A1C: 5.8 % (ref 4.6–6.5)

## 2014-02-05 ENCOUNTER — Ambulatory Visit (INDEPENDENT_AMBULATORY_CARE_PROVIDER_SITE_OTHER): Payer: Medicare Other | Admitting: Family Medicine

## 2014-02-05 ENCOUNTER — Encounter: Payer: Self-pay | Admitting: Family Medicine

## 2014-02-05 ENCOUNTER — Ambulatory Visit (INDEPENDENT_AMBULATORY_CARE_PROVIDER_SITE_OTHER)
Admission: RE | Admit: 2014-02-05 | Discharge: 2014-02-05 | Disposition: A | Discharge status: Home / Self Care | Payer: Medicare Other | Source: Ambulatory Visit | Attending: Family Medicine | Admitting: Family Medicine

## 2014-02-05 VITALS — BP 146/80 | HR 75 | Temp 98.1°F | Ht 69.75 in | Wt 218.0 lb

## 2014-02-05 DIAGNOSIS — Z0000 Encounter for general adult medical examination without abnormal findings: Secondary | ICD-10-CM

## 2014-02-05 DIAGNOSIS — I10 Essential (primary) hypertension: Secondary | ICD-10-CM

## 2014-02-05 DIAGNOSIS — J984 Other disorders of lung: Secondary | ICD-10-CM | POA: Diagnosis not present

## 2014-02-05 DIAGNOSIS — E7800 Pure hypercholesterolemia, unspecified: Secondary | ICD-10-CM

## 2014-02-05 DIAGNOSIS — E038 Other specified hypothyroidism: Secondary | ICD-10-CM

## 2014-02-05 DIAGNOSIS — E1151 Type 2 diabetes mellitus with diabetic peripheral angiopathy without gangrene: Secondary | ICD-10-CM

## 2014-02-05 DIAGNOSIS — F329 Major depressive disorder, single episode, unspecified: Secondary | ICD-10-CM

## 2014-02-05 DIAGNOSIS — R61 Generalized hyperhidrosis: Secondary | ICD-10-CM

## 2014-02-05 DIAGNOSIS — E1159 Type 2 diabetes mellitus with other circulatory complications: Secondary | ICD-10-CM | POA: Diagnosis not present

## 2014-02-05 DIAGNOSIS — F3289 Other specified depressive episodes: Secondary | ICD-10-CM | POA: Diagnosis not present

## 2014-02-05 DIAGNOSIS — R059 Cough, unspecified: Secondary | ICD-10-CM

## 2014-02-05 DIAGNOSIS — R05 Cough: Secondary | ICD-10-CM | POA: Diagnosis not present

## 2014-02-05 DIAGNOSIS — I798 Other disorders of arteries, arterioles and capillaries in diseases classified elsewhere: Secondary | ICD-10-CM

## 2014-02-05 DIAGNOSIS — I2510 Atherosclerotic heart disease of native coronary artery without angina pectoris: Secondary | ICD-10-CM | POA: Diagnosis not present

## 2014-02-05 DIAGNOSIS — J449 Chronic obstructive pulmonary disease, unspecified: Secondary | ICD-10-CM

## 2014-02-05 MED ORDER — OLMESARTAN 10 MG HALF TABLET: 10.0000 mg | ORAL_TABLET | Freq: Every day | ORAL | Status: DC

## 2014-02-05 MED ORDER — SILDENAFIL CITRATE 100 MG PO TABS: 100.0000 mg | ORAL_TABLET | Freq: Every day | ORAL | Status: DC | PRN

## 2014-02-05 MED ORDER — ALBUTEROL SULFATE HFA 108 (90 BASE) MCG/ACT IN AERS: 2.0000 | INHALATION_SPRAY | Freq: Four times a day (QID) | RESPIRATORY_TRACT | Status: DC | PRN

## 2014-02-05 MED ORDER — THYROID 30 MG PO TABS: 30.0000 mg | ORAL_TABLET | Freq: Every day | ORAL | Status: DC

## 2014-02-05 MED ORDER — PRAVASTATIN SODIUM 20 MG PO TABS: 20.0000 mg | ORAL_TABLET | Freq: Every day | ORAL | Status: DC

## 2014-02-05 MED ORDER — TAMSULOSIN HCL 0.4 MG PO CAPS: ORAL_CAPSULE | ORAL | Status: DC

## 2014-02-05 NOTE — Progress Notes (Signed)
Pre visit review using our clinic review tool, if applicable. No additional management support is needed unless otherwise documented below in the visit note.  I have personally reviewed the Medicare Annual Wellness questionnaire and have noted 1. The patient's medical and social history 2. Their use of alcohol, tobacco or illicit drugs 3. Their current medications and supplements 4. The patient's functional ability including ADL's, fall risks, home safety risks and hearing or visual             impairment. 5. Diet and physical activities 6. Evidence for depression or mood disorders  The patients weight, height, BMI have been recorded in the chart and visual acuity is per eye clinic.  I have made referrals, counseling and provided education to the patient based review of the above and I have provided the pt with a written personalized care plan for preventive services.  See scanned forms.  Routine anticipatory guidance given to patient.  See health maintenance. Flu 2014 Shingles 2010 PNA 2013 Tetanus 2014 Colonoscopy 2011 Prostate cancer screening per uro.  Advance directive d/w pt.  Wife designated if incapacitated.  Cognitive function addressed- see scanned forms- and if abnormal then additional documentation follows.   Hypothyroid.  TSH wnl.  Compliant with meds.  No neck mass. Labs d/w pt.   Hypertension:    Using medication without problems or lightheadedness: is getting lightheaded on standing.  Chest pain with exertion:no Edema:no Short of breath: only at extremes of exertion, as expected  Depression. Worse over the winter. Improved with med increase and seasonal change.  No SI/HI.  He would like to continue as is.   Night sweats.  Noted occ at night, not every night.  Going on for 2-3 months.  No fevers.  occ cough.  Smoking hx noted.    DM2.  No meds. No home checks. No sx of low or high sugars. D/w pt about diet and weight.   PMH and SH reviewed  Meds, vitals, and  allergies reviewed.   ROS: See HPI.  Otherwise negative.    GEN: nad, alert and oriented HEENT: mucous membranes moist NECK: supple w/o LA CV: rrr. PULM: ctab, no inc wob ABD: soft, +bs EXT: no edema SKIN: no acute rash  Diabetic foot exam: Normal inspection No skin breakdown No calluses  Normal DP pulses Normal sensation to light touch and monofilament Nails normal

## 2014-02-05 NOTE — Patient Instructions (Signed)
Go to the lab on the way out.  We'll contact you with your xray report. If the sweats continue, then let me know.  Cut back to 10mg  of benicar a day in the meantime.  That should help with the dizzy sensation.  Plan on rechecking your sugar here before a visit in about 6 months.

## 2014-02-06 ENCOUNTER — Other Ambulatory Visit: Payer: Self-pay | Admitting: *Deleted

## 2014-02-06 DIAGNOSIS — M5137 Other intervertebral disc degeneration, lumbosacral region: Secondary | ICD-10-CM | POA: Diagnosis not present

## 2014-02-06 DIAGNOSIS — M999 Biomechanical lesion, unspecified: Secondary | ICD-10-CM | POA: Diagnosis not present

## 2014-02-06 DIAGNOSIS — M62838 Other muscle spasm: Secondary | ICD-10-CM | POA: Diagnosis not present

## 2014-02-06 MED ORDER — ACCU-CHEK AVIVA PLUS W/DEVICE KIT: PACK | Status: DC

## 2014-02-06 MED ORDER — GLUCOSE BLOOD VI STRP: ORAL_STRIP | Status: DC

## 2014-02-06 NOTE — Assessment & Plan Note (Signed)
With night sweats noted would check CT chest.  See xray report.

## 2014-02-06 NOTE — Assessment & Plan Note (Signed)
See above re: CT and cough/sweats.

## 2014-02-06 NOTE — Assessment & Plan Note (Signed)
Controlled, continue as is.  Labs d/w pt.  

## 2014-02-06 NOTE — Assessment & Plan Note (Signed)
Controlled.  Continue off meds.

## 2014-02-06 NOTE — Assessment & Plan Note (Signed)
Improved with med inc and change from winter to spring.  Continue as is.

## 2014-02-06 NOTE — Assessment & Plan Note (Signed)
See scanned forms.  Routine anticipatory guidance given to patient.  See health maintenance. Flu 2014 Shingles 2010 PNA 2013 Tetanus 2014 Colonoscopy 2011 Prostate cancer screening per uro.  Advance directive d/w pt.  Wife designated if incapacitated.  Cognitive function addressed- see scanned forms- and if abnormal then additional documentation follows.

## 2014-02-06 NOTE — Assessment & Plan Note (Signed)
With possible orthostatic sx.  Would cut ARB in half and he can report back as needed.

## 2014-02-08 ENCOUNTER — Ambulatory Visit (INDEPENDENT_AMBULATORY_CARE_PROVIDER_SITE_OTHER)
Admission: RE | Admit: 2014-02-08 | Discharge: 2014-02-08 | Disposition: A | Discharge status: Home / Self Care | Payer: Medicare Other | Source: Ambulatory Visit | Attending: Family Medicine | Admitting: Family Medicine

## 2014-02-08 DIAGNOSIS — I7090 Unspecified atherosclerosis: Secondary | ICD-10-CM | POA: Diagnosis not present

## 2014-02-08 DIAGNOSIS — R05 Cough: Secondary | ICD-10-CM

## 2014-02-08 DIAGNOSIS — R059 Cough, unspecified: Secondary | ICD-10-CM | POA: Diagnosis not present

## 2014-02-08 DIAGNOSIS — J8410 Pulmonary fibrosis, unspecified: Secondary | ICD-10-CM | POA: Diagnosis not present

## 2014-02-16 ENCOUNTER — Other Ambulatory Visit: Payer: Self-pay | Admitting: Family Medicine

## 2014-02-18 ENCOUNTER — Telehealth: Payer: Self-pay | Admitting: Family Medicine

## 2014-02-18 NOTE — Telephone Encounter (Signed)
Noted (re: CAD on CT) that patient is on statin tx.

## 2014-02-19 NOTE — Telephone Encounter (Signed)
Printed.  Thanks.  

## 2014-02-20 NOTE — Telephone Encounter (Signed)
Left detailed message on voicemail.  

## 2014-02-21 ENCOUNTER — Encounter: Payer: Self-pay | Admitting: Family Medicine

## 2014-02-21 ENCOUNTER — Other Ambulatory Visit: Payer: Self-pay | Admitting: Family Medicine

## 2014-02-21 MED ORDER — HYDROCOD POLST-CHLORPHEN POLST 10-8 MG/5ML PO LQCR: ORAL | Status: DC

## 2014-02-21 MED ORDER — ALLOPURINOL 100 MG PO TABS: 100.0000 mg | ORAL_TABLET | Freq: Every day | ORAL | Status: DC

## 2014-03-06 DIAGNOSIS — M5137 Other intervertebral disc degeneration, lumbosacral region: Secondary | ICD-10-CM | POA: Diagnosis not present

## 2014-03-06 DIAGNOSIS — M999 Biomechanical lesion, unspecified: Secondary | ICD-10-CM | POA: Diagnosis not present

## 2014-03-06 DIAGNOSIS — M62838 Other muscle spasm: Secondary | ICD-10-CM | POA: Diagnosis not present

## 2014-03-08 ENCOUNTER — Encounter: Payer: Self-pay | Admitting: Family Medicine

## 2014-03-14 ENCOUNTER — Encounter: Payer: Self-pay | Admitting: Family Medicine

## 2014-03-27 ENCOUNTER — Other Ambulatory Visit: Payer: Self-pay | Admitting: Family Medicine

## 2014-03-27 MED ORDER — HYDROCOD POLST-CHLORPHEN POLST 10-8 MG/5ML PO LQCR: ORAL | Status: DC

## 2014-03-27 MED ORDER — ALLOPURINOL 100 MG PO TABS: 100.0000 mg | ORAL_TABLET | Freq: Every day | ORAL | Status: DC

## 2014-03-27 NOTE — Telephone Encounter (Signed)
Patient advised.  Rx left at front desk for pick up. 

## 2014-03-27 NOTE — Telephone Encounter (Signed)
Cough medicine printed, allopurinol sent.  Thanks.

## 2014-03-27 NOTE — Telephone Encounter (Signed)
MyChart Renewal Request:  Last Filled Tussionex:   150 mL 0RF 02/21/2014.  Last filled Allpurinol:  02/21/14.  Please advise.

## 2014-03-30 ENCOUNTER — Ambulatory Visit (INDEPENDENT_AMBULATORY_CARE_PROVIDER_SITE_OTHER): Payer: Medicare Other | Admitting: Family Medicine

## 2014-03-30 ENCOUNTER — Encounter: Payer: Self-pay | Admitting: Family Medicine

## 2014-03-30 VITALS — BP 148/72 | HR 71 | Temp 98.2°F | Wt 211.5 lb

## 2014-03-30 DIAGNOSIS — J449 Chronic obstructive pulmonary disease, unspecified: Secondary | ICD-10-CM

## 2014-03-30 DIAGNOSIS — I2510 Atherosclerotic heart disease of native coronary artery without angina pectoris: Secondary | ICD-10-CM

## 2014-03-30 MED ORDER — BUDESONIDE-FORMOTEROL FUMARATE 160-4.5 MCG/ACT IN AERO: 2.0000 | INHALATION_SPRAY | Freq: Two times a day (BID) | RESPIRATORY_TRACT | Status: DC

## 2014-03-30 NOTE — Patient Instructions (Signed)
Start back on the symbicort and then update me in about 7-10 days.   Take care.  Glad to see you.

## 2014-03-30 NOTE — Progress Notes (Signed)
Pre visit review using our clinic review tool, if applicable. No additional management support is needed unless otherwise documented below in the visit note.  DM2- Sugar 100-130 recently on home checks.  No meds.    Last few months of "not being clear in my head."  Fatigued.  He'll take a nap in the AM after getting up.  SOB this AM taking a shower.  Exercise tolerance is decreased.  He "can't do as much as he used to."  Getting SOB with exertion and wife thinks this seems to be the main cause.  No CP.  No BLE edema.  He isn't on any inhalers other than prn SABA.  Some wheeze, wife had noted it especially.  Still using cpap.    He knows the year, month, date.  3/3 attention, recall.  Can read a watch, do math.    Meds, vitals, and allergies reviewed.   ROS: See HPI.  Otherwise, noncontributory.  nad ncat Tm wnl Nasal and OP exam wnl Neck supple, no LA rrr Ctab except for faint wheeze noted, expiratory.   No inc in wob abd soft Not ttp, normal BS No cyanosis.

## 2014-03-30 NOTE — Assessment & Plan Note (Signed)
Restart symbicort and he'll update me with exercise tolerance in about 7-10 days.  Continue PRN saba o/w in the meantime.  He agrees.  This isn't acute and doesn't appear to be cardiac related. Okay for outpatient f/u.

## 2014-04-04 DIAGNOSIS — M62838 Other muscle spasm: Secondary | ICD-10-CM | POA: Diagnosis not present

## 2014-04-04 DIAGNOSIS — M999 Biomechanical lesion, unspecified: Secondary | ICD-10-CM | POA: Diagnosis not present

## 2014-04-04 DIAGNOSIS — M5137 Other intervertebral disc degeneration, lumbosacral region: Secondary | ICD-10-CM | POA: Diagnosis not present

## 2014-04-09 DIAGNOSIS — M999 Biomechanical lesion, unspecified: Secondary | ICD-10-CM | POA: Diagnosis not present

## 2014-04-09 DIAGNOSIS — M62838 Other muscle spasm: Secondary | ICD-10-CM | POA: Diagnosis not present

## 2014-04-09 DIAGNOSIS — M5137 Other intervertebral disc degeneration, lumbosacral region: Secondary | ICD-10-CM | POA: Diagnosis not present

## 2014-04-17 ENCOUNTER — Encounter: Payer: Self-pay | Admitting: Family Medicine

## 2014-04-30 ENCOUNTER — Encounter: Payer: Self-pay | Admitting: Family Medicine

## 2014-05-02 DIAGNOSIS — M999 Biomechanical lesion, unspecified: Secondary | ICD-10-CM | POA: Diagnosis not present

## 2014-05-02 DIAGNOSIS — M62838 Other muscle spasm: Secondary | ICD-10-CM | POA: Diagnosis not present

## 2014-05-02 DIAGNOSIS — M5137 Other intervertebral disc degeneration, lumbosacral region: Secondary | ICD-10-CM | POA: Diagnosis not present

## 2014-05-04 DIAGNOSIS — M62838 Other muscle spasm: Secondary | ICD-10-CM | POA: Diagnosis not present

## 2014-05-04 DIAGNOSIS — M5137 Other intervertebral disc degeneration, lumbosacral region: Secondary | ICD-10-CM | POA: Diagnosis not present

## 2014-05-04 DIAGNOSIS — M999 Biomechanical lesion, unspecified: Secondary | ICD-10-CM | POA: Diagnosis not present

## 2014-05-07 DIAGNOSIS — M999 Biomechanical lesion, unspecified: Secondary | ICD-10-CM | POA: Diagnosis not present

## 2014-05-07 DIAGNOSIS — M5137 Other intervertebral disc degeneration, lumbosacral region: Secondary | ICD-10-CM | POA: Diagnosis not present

## 2014-05-07 DIAGNOSIS — M62838 Other muscle spasm: Secondary | ICD-10-CM | POA: Diagnosis not present

## 2014-05-08 ENCOUNTER — Ambulatory Visit (INDEPENDENT_AMBULATORY_CARE_PROVIDER_SITE_OTHER): Payer: Medicare Other | Admitting: Family Medicine

## 2014-05-08 ENCOUNTER — Encounter: Payer: Self-pay | Admitting: Family Medicine

## 2014-05-08 VITALS — BP 108/58 | HR 74 | Temp 97.9°F | Wt 214.5 lb

## 2014-05-08 DIAGNOSIS — I2510 Atherosclerotic heart disease of native coronary artery without angina pectoris: Secondary | ICD-10-CM

## 2014-05-08 DIAGNOSIS — R059 Cough, unspecified: Secondary | ICD-10-CM

## 2014-05-08 DIAGNOSIS — R05 Cough: Secondary | ICD-10-CM | POA: Diagnosis not present

## 2014-05-08 MED ORDER — TIOTROPIUM BROMIDE MONOHYDRATE 18 MCG IN CAPS: 18.0000 ug | ORAL_CAPSULE | Freq: Every day | RESPIRATORY_TRACT | Status: DC

## 2014-05-08 MED ORDER — FAMOTIDINE 10 MG PO TABS: 10.0000 mg | ORAL_TABLET | Freq: Two times a day (BID) | ORAL | Status: DC

## 2014-05-08 MED ORDER — OLMESARTAN MEDOXOMIL 5 MG PO TABS: 2.5000 mg | ORAL_TABLET | Freq: Every day | ORAL | Status: DC

## 2014-05-08 NOTE — Progress Notes (Signed)
Pre visit review using our clinic review tool, if applicable. No additional management support is needed unless otherwise documented below in the visit note.  To recap: 14 lbs intentional weight loss in the last ~1 year.   Low BP noted today . Back on inhalers in the meantime- symbicort had helped him prev.   No chest pain.  No claudication in BLE.  Fatigued.  He gets lightheaded on standing, but not with rolling over in the bed.  He doesn't think his thinking is as clear as prev- he describes it as "foggy" but on clarification that actually means lightheaded him to him.  Heat and humidity have affected him recently, with more dyspnea.  Needing SABA daily, with less effect that prev.  Compliant with symbicort.  He clearly breaths better inside, with air conditioning.  He continues to have postnasal gtt, has been going on for years.  Waking every AM with eye crusting, this is a chronic issue.    H/o heart cath prev done, no stent placed, goal was medical mgmt.    Still with cough noted, occ taking tussionex for cough at night.  He has a feeling of an acidic taste in his throat at night.    His exercise tolerance is clearly decreased- d/w pt, unclear if from CV, pulmonary, BP source.     PMH and SH reviewed  ROS: See HPI, otherwise noncontributory.  Meds, vitals, and allergies reviewed.   GEN: nad, alert and oriented HEENT: mucous membranes moist NECK: supple w/o LA CV: rrr.  PULM: ctab except for scant ext wheeze B, no inc wob ABD: soft, +bs EXT: no edema SKIN: no acute rash

## 2014-05-08 NOTE — Assessment & Plan Note (Signed)
Likely multifactorial.  Possible nocturnal GERD component.  Would add on H2 blocker.  D/w pt.  COPD likely contributing, worse with change in season, recent heat wave.  Had improved prev with symbicort.  Add on spiriva and will notify pulm as a FYI.  He does have scant wheeze today.  Both likely contribute to fatigue, but also has orthostatic sx on standing today.  Would dec ARB to 2.5mg  and have him report back.   All d/w pt.  He agrees. He has no claudication or CP, so this doesn't appear cardiac.  I will notify cards only as a FYI, given his dec in exercise tolerance.  We may need repeat cards eval sooner rather than later should his condition not improve with the interventions above.   >25 minutes spent in face to face time with patient, >50% spent in counselling or coordination of care.

## 2014-05-08 NOTE — Patient Instructions (Signed)
Keep using your inhalers but add on spiriva once a day.  Start taking pepcid in the meantime.  See if that helps with the nighttime cough and sensation of acid in your throat.  Cut the olmestartan back to 2.5mg  a day.  I'll check with pulmonary and cardiology in the meantime.

## 2014-05-09 ENCOUNTER — Telehealth: Payer: Self-pay

## 2014-05-09 DIAGNOSIS — M5137 Other intervertebral disc degeneration, lumbosacral region: Secondary | ICD-10-CM | POA: Diagnosis not present

## 2014-05-09 DIAGNOSIS — M999 Biomechanical lesion, unspecified: Secondary | ICD-10-CM | POA: Diagnosis not present

## 2014-05-09 DIAGNOSIS — M62838 Other muscle spasm: Secondary | ICD-10-CM | POA: Diagnosis not present

## 2014-05-09 NOTE — Telephone Encounter (Signed)
lmtcb X1 on cell, atc home X2, line was busy.

## 2014-05-09 NOTE — Telephone Encounter (Signed)
Message copied by Len Blalock on Wed May 09, 2014  5:00 PM ------      Message from: Simonne Maffucci B      Created: Wed May 09, 2014  2:43 AM       A,            Sounds like he needs to see [place].  Offer my first available or Tammy if he doesn't want to wait that long.            Thanks      B      ----- Message -----         From: Tonia Ghent, MD         Sent: 05/08/2014  10:09 AM           To: Juanito Doom, MD                   ------

## 2014-05-10 ENCOUNTER — Telehealth: Payer: Self-pay | Admitting: Pulmonary Disease

## 2014-05-10 NOTE — Telephone Encounter (Signed)
Spoke with pt and scheduled f/u with Dr Lake Bells.

## 2014-05-10 NOTE — Telephone Encounter (Signed)
Please see last phone note, (it was close by accident). Pt is returning call. Please call back at [phone]

## 2014-05-10 NOTE — Telephone Encounter (Signed)
Pt returned call

## 2014-05-11 ENCOUNTER — Encounter: Payer: Self-pay | Admitting: Pulmonary Disease

## 2014-05-11 ENCOUNTER — Ambulatory Visit (INDEPENDENT_AMBULATORY_CARE_PROVIDER_SITE_OTHER): Payer: Medicare Other | Admitting: Pulmonary Disease

## 2014-05-11 VITALS — BP 108/64 | HR 82 | Ht 70.0 in | Wt 215.0 lb

## 2014-05-11 DIAGNOSIS — G4733 Obstructive sleep apnea (adult) (pediatric): Secondary | ICD-10-CM | POA: Diagnosis not present

## 2014-05-11 DIAGNOSIS — M5137 Other intervertebral disc degeneration, lumbosacral region: Secondary | ICD-10-CM | POA: Diagnosis not present

## 2014-05-11 DIAGNOSIS — J449 Chronic obstructive pulmonary disease, unspecified: Secondary | ICD-10-CM | POA: Diagnosis not present

## 2014-05-11 DIAGNOSIS — G4730 Sleep apnea, unspecified: Secondary | ICD-10-CM | POA: Diagnosis not present

## 2014-05-11 DIAGNOSIS — I2510 Atherosclerotic heart disease of native coronary artery without angina pectoris: Secondary | ICD-10-CM | POA: Diagnosis not present

## 2014-05-11 DIAGNOSIS — J310 Chronic rhinitis: Secondary | ICD-10-CM | POA: Diagnosis not present

## 2014-05-11 DIAGNOSIS — J410 Simple chronic bronchitis: Secondary | ICD-10-CM

## 2014-05-11 DIAGNOSIS — R5383 Other fatigue: Secondary | ICD-10-CM

## 2014-05-11 DIAGNOSIS — R5381 Other malaise: Secondary | ICD-10-CM

## 2014-05-11 DIAGNOSIS — Z9989 Dependence on other enabling machines and devices: Principal | ICD-10-CM

## 2014-05-11 DIAGNOSIS — M999 Biomechanical lesion, unspecified: Secondary | ICD-10-CM | POA: Diagnosis not present

## 2014-05-11 DIAGNOSIS — M62838 Other muscle spasm: Secondary | ICD-10-CM | POA: Diagnosis not present

## 2014-05-11 NOTE — Assessment & Plan Note (Signed)
His daytime somnolence and fatigue are worrisome for poorly controlled sleep apnea. See discussion above.

## 2014-05-11 NOTE — Assessment & Plan Note (Signed)
This is the primary cause of his cough, specifically at night.  Plan: -Add back antihistamine, saline rinses, and nasal steroid -If no improvement with Nasacort then consider using Dymista

## 2014-05-11 NOTE — Patient Instructions (Signed)
Use Neil Med rinses with distilled water at least twice per day using the instructions on the package. 1/2 hour after using the [facility] Med rinse, use Nasacort two puffs in each nostril once per day.  Remember that the Nasacort can take 1-2 weeks to work after regular use. Use generic zyrtec (cetirizine) every day.  If this doesn't help, then stop taking it and use chlorpheniramine-phenylephrine combination tablets.  We will arrange a sleep study to get the pressure in your CPAP machine right  Keep taking your inhalers as you are doing  We will see you back in 6 months or sooner if needed
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
## 2014-05-11 NOTE — Assessment & Plan Note (Signed)
The differential diagnosis here is broad. However with a specific pulmonary focus I am concerned that his obstructive sleep apnea may be at play. It has been many years since he has had a CPAP titration study. Further it is very likely that the nocturnal narcotics that he is using is contributing to central sleep apnea or other sleep disturbances.  Plan: -Focus on treating cough by treating his nasal symptoms and trying to eliminate the nocturnal narcotics -Repeat CPAP titration study in [place] -Continue followup with primary care physician

## 2014-05-11 NOTE — Assessment & Plan Note (Signed)
Unfortunately it sounds like Christopher Santiago's COPD got out of control this winter when he stopped taking his Symbicort for some reason. He says he thinks he just forgot.  However, today he does not sound too bad on physical exam at an not sure that COPD is the only explanation for his fatigue. I do agree with the decision of his primary care physician to put him on both Symbicort and Spiriva for now.  Plan: -Continue Symbicort and Spiriva -See below

## 2014-05-15 ENCOUNTER — Telehealth: Payer: Self-pay | Admitting: Family Medicine

## 2014-05-15 MED ORDER — ALBUTEROL SULFATE HFA 108 (90 BASE) MCG/ACT IN AERS: 2.0000 | INHALATION_SPRAY | Freq: Four times a day (QID) | RESPIRATORY_TRACT | Status: DC | PRN

## 2014-05-15 NOTE — Telephone Encounter (Signed)
Rx sent and Pt's wife notified Rx sent in to pharmacy

## 2014-05-15 NOTE — Telephone Encounter (Signed)
[name] (spouse) called they are camping and mr Christopher Santiago forgot his inhaler. Ventolin hfa  Can dr Damita Dunnings call in rx for him @ [facility] in [facility] [phone]  Please let [name] know when rx has been called in

## 2014-05-15 NOTE — Telephone Encounter (Signed)
Thanks

## 2014-05-15 NOTE — Telephone Encounter (Signed)
Pt's wife called back, Pharmacy should be [facility] in [place].   Please do not send to previous pharmacy  / lt

## 2014-05-23 DIAGNOSIS — M5137 Other intervertebral disc degeneration, lumbosacral region: Secondary | ICD-10-CM | POA: Diagnosis not present

## 2014-05-23 DIAGNOSIS — M62838 Other muscle spasm: Secondary | ICD-10-CM | POA: Diagnosis not present

## 2014-05-23 DIAGNOSIS — M999 Biomechanical lesion, unspecified: Secondary | ICD-10-CM | POA: Diagnosis not present

## 2014-05-31 ENCOUNTER — Other Ambulatory Visit: Payer: Self-pay | Admitting: Family Medicine

## 2014-05-31 ENCOUNTER — Encounter: Payer: Self-pay | Admitting: Family Medicine

## 2014-05-31 MED ORDER — TIOTROPIUM BROMIDE MONOHYDRATE 18 MCG IN CAPS: 18.0000 ug | ORAL_CAPSULE | Freq: Every day | RESPIRATORY_TRACT | Status: DC

## 2014-06-05 ENCOUNTER — Encounter: Payer: Self-pay | Admitting: Family Medicine

## 2014-06-06 ENCOUNTER — Other Ambulatory Visit: Payer: Self-pay | Admitting: Family Medicine

## 2014-06-06 MED ORDER — OLMESARTAN MEDOXOMIL 5 MG PO TABS: 5.0000 mg | ORAL_TABLET | Freq: Every day | ORAL | Status: DC

## 2014-06-11 ENCOUNTER — Ambulatory Visit (HOSPITAL_BASED_OUTPATIENT_CLINIC_OR_DEPARTMENT_OTHER): Payer: Medicare Other | Attending: Pulmonary Disease | Admitting: Radiology

## 2014-06-11 ENCOUNTER — Other Ambulatory Visit: Payer: Self-pay | Admitting: *Deleted

## 2014-06-11 VITALS — Ht 70.0 in | Wt 210.0 lb

## 2014-06-11 DIAGNOSIS — I4949 Other premature depolarization: Secondary | ICD-10-CM | POA: Insufficient documentation

## 2014-06-11 DIAGNOSIS — Z9989 Dependence on other enabling machines and devices: Secondary | ICD-10-CM

## 2014-06-11 DIAGNOSIS — I491 Atrial premature depolarization: Secondary | ICD-10-CM | POA: Diagnosis not present

## 2014-06-11 DIAGNOSIS — G4710 Hypersomnia, unspecified: Secondary | ICD-10-CM | POA: Diagnosis present

## 2014-06-11 DIAGNOSIS — G4733 Obstructive sleep apnea (adult) (pediatric): Secondary | ICD-10-CM | POA: Diagnosis not present

## 2014-06-11 DIAGNOSIS — G4730 Sleep apnea, unspecified: Secondary | ICD-10-CM | POA: Diagnosis present

## 2014-06-11 DIAGNOSIS — G4761 Periodic limb movement disorder: Secondary | ICD-10-CM | POA: Diagnosis not present

## 2014-06-11 MED ORDER — OLMESARTAN MEDOXOMIL 5 MG PO TABS: 5.0000 mg | ORAL_TABLET | Freq: Every day | ORAL | Status: DC

## 2014-06-11 MED ORDER — TIOTROPIUM BROMIDE MONOHYDRATE 18 MCG IN CAPS: 18.0000 ug | ORAL_CAPSULE | Freq: Every day | RESPIRATORY_TRACT | Status: DC

## 2014-06-11 NOTE — Telephone Encounter (Signed)
Patient requests 120 day supplies.

## 2014-06-13 DIAGNOSIS — M62838 Other muscle spasm: Secondary | ICD-10-CM | POA: Diagnosis not present

## 2014-06-13 DIAGNOSIS — M999 Biomechanical lesion, unspecified: Secondary | ICD-10-CM | POA: Diagnosis not present

## 2014-06-13 DIAGNOSIS — M5137 Other intervertebral disc degeneration, lumbosacral region: Secondary | ICD-10-CM | POA: Diagnosis not present

## 2014-06-15 DIAGNOSIS — G4730 Sleep apnea, unspecified: Secondary | ICD-10-CM

## 2014-06-15 DIAGNOSIS — G4710 Hypersomnia, unspecified: Secondary | ICD-10-CM | POA: Diagnosis not present

## 2014-06-15 NOTE — Sleep Study (Signed)
   NAME: Christopher Santiago DATE OF BIRTH:  1931-10-27 MEDICAL RECORD NUMBER [id number]  LOCATION: [facility]  PHYSICIAN: Kathee Delton  DATE OF STUDY: 06/11/2014  SLEEP STUDY TYPE: Nocturnal Polysomnogram               REFERRING PHYSICIAN: Juanito Doom, MD  INDICATION FOR STUDY: hypersomnia with sleep apnea  EPWORTH SLEEPINESS SCORE:  9 HEIGHT: 5\' 10"  (177.8 cm)  WEIGHT: 210 lb (95.255 kg)    Body mass index is 30.13 kg/(m^2).  NECK SIZE: 16 in.  MEDICATIONS: reviewed in the sleep record  SLEEP ARCHITECTURE: The patient had a total sleep time of 270 minutes with no slow-wave sleep and only 46 minutes of REM.  Sleep onset latency was prolonged at 94 minutes, and REM onset was delayed at 180 minutes. Sleep efficiency was poor at 59%.  RESPIRATORY DATA: The patient underwent a CPAP titration study with his usual nasal mask from home. He had issues with mouth opening from the very start, and a chin strap was added to prevent oral venting. Despite this, he continued to have significant mouth leak. He was therefore changed to a large Fischer Paykel Simplus full face mask, and CPAP titration was continued. He was found to have an optimal pressure of 10 cm of water, but it should be noted he did not achieve supine REM on the final setting.  OXYGEN DATA: there was oxygen desaturation as low as 88% prior to optimal cpap pressure being reached.   CARDIAC DATA: PAC's and PVCs were noted  MOVEMENT/PARASOMNIA: The patient had 248 periodic limb movements, with 4 per hour resulting in arousal or awakening. There were no abnormal behaviors seen.  IMPRESSION/ RECOMMENDATION:   1) good control of previously documented obstructive sleep apnea with a CPAP pressure of 10 cm of water, and delivered by a large Fischer Paykel Simplus full face mask.  The patient did poorly during the study with a nasal mask, and continued to have oral venting even with a chin strap. He should also be  encouraged to work on weight loss.  2) PACs and PVCs were noted, but no clinically significant arrhythmias were seen.  3) large numbers of periodic limb movements noted with what appears to be significant sleep disruption. It is unclear if this is related to the patient's sleep disordered breathing, a primary movement disorder of sleep, or chronic musculoskeletal pain or neuropathy. Clinical correlation is suggested.     [place], American Board of Sleep Medicine  ELECTRONICALLY SIGNED ON:  06/15/2014, 2:57 PM [place] PH: [phone]   FX: [phone] [place]

## 2014-06-18 ENCOUNTER — Telehealth: Payer: Self-pay

## 2014-06-18 DIAGNOSIS — G4733 Obstructive sleep apnea (adult) (pediatric): Secondary | ICD-10-CM

## 2014-06-18 NOTE — Telephone Encounter (Signed)
A,  Based on his CPAP titration study we need to titrate up his CPAP to 10cm H20. Can we make sure he has an order for this?  Thanks B -------------------------------------------------------------------------   Spoke with pt, he is aware of cpap titration results.  Order to change cpap has been sent in.  Nothing further needed at this time.

## 2014-06-20 ENCOUNTER — Other Ambulatory Visit: Payer: Self-pay | Admitting: Family Medicine

## 2014-06-20 MED ORDER — CITALOPRAM HYDROBROMIDE 40 MG PO TABS: 40.0000 mg | ORAL_TABLET | Freq: Every day | ORAL | Status: DC

## 2014-06-20 MED ORDER — FAMOTIDINE 10 MG PO TABS: 10.0000 mg | ORAL_TABLET | Freq: Two times a day (BID) | ORAL | Status: DC

## 2014-06-20 MED ORDER — "SYRINGE/NEEDLE (DISP) 21G X 1-1/2"" 10 ML MISC": Status: DC

## 2014-06-20 MED ORDER — ALBUTEROL SULFATE HFA 108 (90 BASE) MCG/ACT IN AERS: 2.0000 | INHALATION_SPRAY | Freq: Four times a day (QID) | RESPIRATORY_TRACT | Status: DC | PRN

## 2014-06-20 MED ORDER — TIOTROPIUM BROMIDE MONOHYDRATE 18 MCG IN CAPS: 18.0000 ug | ORAL_CAPSULE | Freq: Every day | RESPIRATORY_TRACT | Status: DC

## 2014-06-20 NOTE — Telephone Encounter (Signed)
Last OV 05/07/14. Is it okay to refill medications as requested?

## 2014-06-20 NOTE — Telephone Encounter (Signed)
Spoke to patient and was advised that he is okay on his Allopurinol, but also needs Spiriva.

## 2014-06-20 NOTE — Telephone Encounter (Signed)
All sent.  Thanks.

## 2014-06-20 NOTE — Telephone Encounter (Signed)
Pt is requesting 120 refill on All Maintenance medications.   famotidine (PEPCID AC) 10 MG tablet [[id number]] Order Details citalopram (CELEXA) 40 MG tablet [[id number]] Order Details allopurinol (ZYLOPRIM) 100 MG tablet [[id number]] Order Details albuterol (PROVENTIL HFA;VENTOLIN HFA) 108 (90 BASE) MCG/ACT inhaler [[id number]] Order Details And BD Syringe (used with his Testerone injections)

## 2014-07-03 ENCOUNTER — Encounter: Payer: Self-pay | Admitting: Cardiovascular Disease

## 2014-07-03 ENCOUNTER — Ambulatory Visit (INDEPENDENT_AMBULATORY_CARE_PROVIDER_SITE_OTHER): Payer: Medicare Other | Admitting: Cardiovascular Disease

## 2014-07-03 VITALS — BP 118/70 | HR 80 | Ht 69.5 in | Wt 216.2 lb

## 2014-07-03 DIAGNOSIS — I2510 Atherosclerotic heart disease of native coronary artery without angina pectoris: Secondary | ICD-10-CM | POA: Diagnosis not present

## 2014-07-03 DIAGNOSIS — J438 Other emphysema: Secondary | ICD-10-CM

## 2014-07-03 DIAGNOSIS — E669 Obesity, unspecified: Secondary | ICD-10-CM | POA: Diagnosis not present

## 2014-07-03 DIAGNOSIS — I10 Essential (primary) hypertension: Secondary | ICD-10-CM

## 2014-07-03 DIAGNOSIS — R0602 Shortness of breath: Secondary | ICD-10-CM

## 2014-07-03 DIAGNOSIS — G4730 Sleep apnea, unspecified: Secondary | ICD-10-CM

## 2014-07-03 DIAGNOSIS — E7800 Pure hypercholesterolemia, unspecified: Secondary | ICD-10-CM

## 2014-07-03 MED ORDER — LOSARTAN POTASSIUM 25 MG PO TABS: 25.0000 mg | ORAL_TABLET | Freq: Every day | ORAL | Status: DC

## 2014-07-03 NOTE — Assessment & Plan Note (Signed)
We have encouraged continued exercise, careful diet management in an effort to lose weight. 

## 2014-07-03 NOTE — Assessment & Plan Note (Signed)
Continues to have shortness of breath, now more compliant with his inhalers

## 2014-07-03 NOTE — Assessment & Plan Note (Signed)
He does have significant leg weakness. Uncertain if this is from his pravastatin. Suggested he could hold the statin for 2 weeks to see if legs strength improves. If it does, an alternate medication could be used, otherwise would restart pravastatin

## 2014-07-03 NOTE — Patient Instructions (Signed)
You are doing well. Hold the benicar when you run out Then start losartan one a day  Do a trial off pravastatin for 2 to 3 weeks for leg weakness If better, call the office for alternate pill  Please call us if you have new issues that need to be addressed before your next appt.  Your physician wants you to follow-up in: 6 months.  You will receive a reminder letter in the mail two months in advance. If you don't receive a letter, please call our office to schedule the follow-up appointment.

## 2014-07-03 NOTE — Progress Notes (Signed)
Patient ID: Christopher Santiago, male    DOB: 1930-12-21, 80 y.o.   MRN: [id number]  HPI Comments: Christopher Santiago is a  80 year old man with a history of diffuse moderate three-vessel coronary artery disease cardiac catheterization in 2006, PVD of the LE, hypertension, hyperlipidemia, diabetes, PAD, and COPD.  He returns today for routine followup.   He smoked for 50 years, stopping over 10 years ago.  History of Sleep apnea and uses CPAP.  He has peripheral vascular disease with blockage in his left lower extremity.   In the past, he has insisted on weaning himself off his medications .  On today's visit, he reports that he had COPD exacerbation. This started after he weaned himself off his inhalers over the winter. His legs continue to be weak, continues to have shortness of breath. He denies any significant chest pain concerning for angina. He has obstructive sleep apnea, uses his CPAP religiously. He is now tolerating pravastatin daily Hemoglobin A1c 5.8, total cholesterol 145, LDL 78 He does report that the Christopher Santiago is very expensive for him  He reports that he stopped smoking 15 years ago. He does not do any aerobic activity secondary to his ankle  EKG shows normal sinus rhythm with rate 80 beats per minute with right bundle branch block, left anterior fascicular block   CORONARY ANGIOGRAPHY: In 2006, November  .  The left anterior descending artery was a long vessel wrapping the apex.      It gave off 2 small proximal diagonals. In the proximal LAD there was a      tubular 30-40% lesion. In the proximal to mid LAD, right at the takeoff      of the second diagonal, there was a 50% tubular lesion. The mid LAD      there was a 40% tubular lesion. In the lower branch of the first      diagonal there is a 99% lesion with subtotal occlusion of the vessel.  3.  The left circumflex was a large vessel. It gave off a large branching      ramus, a large OM-1 and moderate-to-large OM-2 and OM-3. In the  mid      circumflex there was a 30% lesion, followed by a 50-60% lesion at the      takeoff of the OM-2. Distally there was a 30% lesion. In the upper      branch of a large ramus there was a 60-70% lesion. In the OM-1 there was      a 50% mid portion, followed by a 70-80% tubular lesion. In the OM-2      there was a 50% proximal lesion.  4.  The right coronary artery was a dominant vessel. There was a 40%      proximally, which possibly related to catheter induced spasm -- though      it did not change much with nitroglycerin throughout the proximal      portion. There were multiple irregularities about 30-40% stenoses. The      mid portion, after the takeoff the RV branch, there was a 40% stenosis.      Distally, prior to the takeoff of the PDA, there was 40% stenosis. There      was a moderate size PDA and 2 small PLs.       Outpatient Encounter Prescriptions as of 07/03/2014  Medication Sig  . albuterol (PROVENTIL HFA;VENTOLIN HFA) 108 (90 BASE) MCG/ACT inhaler Inhale 2 puffs into the lungs every  6 (six) hours as needed for wheezing or shortness of breath.  . allopurinol (ZYLOPRIM) 100 MG tablet Take 1 tablet (100 mg total) by mouth daily.  Marland Kitchen aspirin 81 MG EC tablet Take 81 mg by mouth daily.    . Blood Glucose Monitoring Suppl (ACCU-CHEK AVIVA PLUS) W/DEVICE KIT Use to check blood sugar three times weekly or as needed.  Dx:  250.00  Non-insulin dependent.  . budesonide-formoterol (SYMBICORT) 160-4.5 MCG/ACT inhaler Inhale 2 puffs into the lungs 2 (two) times daily.  . chlorpheniramine-HYDROcodone (TUSSIONEX) 10-8 MG/5ML LQCR TAKE 5ML BY MOUTH AT BEDTIME AS NEEDED  . Cholecalciferol (VITAMIN D3) 1000 UNITS tablet Take 1,000 Units by mouth daily.    . citalopram (CELEXA) 40 MG tablet Take 1 tablet (40 mg total) by mouth daily.  Marland Kitchen glucose blood test strip Check blood sugar 3 times a week or as needed.  . pravastatin (PRAVACHOL) 20 MG tablet Take 1 tablet (20 mg total) by mouth daily.   . sildenafil (VIAGRA) 100 MG tablet Take 1 tablet (100 mg total) by mouth daily as needed for erectile dysfunction.  . SYRINGE-NEEDLE, DISP, 10 ML 21G X 1-1/2" 10 ML MISC Use as directed  . tamsulosin (FLOMAX) 0.4 MG CAPS capsule Take 1 capsule by mouth  daily  . testosterone cypionate (DEPOTESTOTERONE CYPIONATE) 200 MG/ML injection Inject 200 mg into the muscle every 14 (fourteen) days.    Marland Kitchen thyroid (ARMOUR) 30 MG tablet Take 1 tablet (30 mg total) by mouth daily.  Marland Kitchen tiotropium (SPIRIVA HANDIHALER) 18 MCG inhalation capsule Place 1 capsule (18 mcg total) into inhaler and inhale daily.  Marland Kitchen triamcinolone (NASACORT) 55 MCG/ACT nasal inhaler Place 2 sprays into the nose daily.  Marland Kitchen olmesartan (Christopher Santiago) 5 MG tablet Take 1 tablet (5 mg total) by mouth daily.    Review of Systems  Constitutional: Positive for fatigue.  HENT: Negative.   Eyes: Negative.   Respiratory: Positive for shortness of breath.   Cardiovascular: Negative.   Gastrointestinal: Negative.   Endocrine: Negative.   Musculoskeletal: Positive for back pain and myalgias.  Skin: Negative.   Allergic/Immunologic: Negative.   Neurological: Negative.   Hematological: Negative.   Psychiatric/Behavioral: Negative.   All other systems reviewed and are negative.   BP 118/70  Pulse 80  Ht 5' 9.5" (1.765 m)  Wt 216 lb 4 oz (98.09 kg)  BMI 31.49 kg/m2  Physical Exam  Nursing note and vitals reviewed. Constitutional: He is oriented to person, place, and time. He appears well-developed and well-nourished.  HENT:  Head: Normocephalic.  Nose: Nose normal.  Mouth/Throat: Oropharynx is clear and moist.  Eyes: Conjunctivae are normal. Pupils are equal, round, and reactive to light.  Neck: Normal range of motion. Neck supple. No JVD present.  Cardiovascular: Normal rate, regular rhythm, S1 normal, S2 normal and intact distal pulses.  Exam reveals no gallop and no friction rub.   Murmur heard.  Crescendo systolic murmur is present with  a grade of 2/6  Pulmonary/Chest: Effort normal and breath sounds normal. No respiratory distress. He has no wheezes. He has no rales. He exhibits no tenderness.  Abdominal: Soft. Bowel sounds are normal. He exhibits no distension. There is no tenderness.  Musculoskeletal: Normal range of motion. He exhibits no edema and no tenderness.  Lymphadenopathy:    He has no cervical adenopathy.  Neurological: He is alert and oriented to person, place, and time. Coordination normal.  Skin: Skin is warm and dry. No rash noted. No erythema.  Psychiatric: He has  a normal mood and affect. His behavior is normal. Judgment and thought content normal.      Assessment and Plan

## 2014-07-03 NOTE — Assessment & Plan Note (Addendum)
Blood pressure is well controlled on today's visit. At his request, we will hold the Benicar, start losartan 25 mg daily

## 2014-07-03 NOTE — Assessment & Plan Note (Signed)
He is compliant with his CPAP

## 2014-07-03 NOTE — Assessment & Plan Note (Signed)
Currently with no symptoms of angina. No further workup at this time. Continue current medication regimen. 

## 2014-07-11 DIAGNOSIS — M5137 Other intervertebral disc degeneration, lumbosacral region: Secondary | ICD-10-CM | POA: Diagnosis not present

## 2014-07-11 DIAGNOSIS — M999 Biomechanical lesion, unspecified: Secondary | ICD-10-CM | POA: Diagnosis not present

## 2014-07-11 DIAGNOSIS — M62838 Other muscle spasm: Secondary | ICD-10-CM | POA: Diagnosis not present

## 2014-08-11 ENCOUNTER — Other Ambulatory Visit: Payer: Self-pay | Admitting: Family Medicine

## 2014-08-13 ENCOUNTER — Ambulatory Visit (INDEPENDENT_AMBULATORY_CARE_PROVIDER_SITE_OTHER): Payer: Medicare Other | Admitting: Family Medicine

## 2014-08-13 ENCOUNTER — Encounter: Payer: Self-pay | Admitting: Family Medicine

## 2014-08-13 VITALS — BP 148/80 | HR 88 | Temp 99.3°F | Wt 208.5 lb

## 2014-08-13 DIAGNOSIS — I2510 Atherosclerotic heart disease of native coronary artery without angina pectoris: Secondary | ICD-10-CM

## 2014-08-13 DIAGNOSIS — J019 Acute sinusitis, unspecified: Secondary | ICD-10-CM | POA: Diagnosis not present

## 2014-08-13 MED ORDER — AMOXICILLIN-POT CLAVULANATE 875-125 MG PO TABS: 1.0000 | ORAL_TABLET | Freq: Two times a day (BID) | ORAL | Status: DC

## 2014-08-13 NOTE — Progress Notes (Signed)
Pre visit review using our clinic review tool, if applicable. No additional management support is needed unless otherwise documented below in the visit note.  Off pravastatin. Leg sx note better in the meantime.    Sx for the last week.  More SOB, less exercise tolerance.  Occ light headed, but not always.  Fever last night.  Night sweats last night. No vomiting. More cough than normal.  Some occ sputum, cough is occ dry.  Taking mucinex.  H/o post nasal gtt, had not been using nasal steroid all the time.  Nasal saline- yellow discharge from that.  Restarted nasal wash recently.    Going on a cruise Friday.  Driving to [place] then cruising to [place] and [place].    Meds, vitals, and allergies reviewed.   ROS: See HPI.  Otherwise, noncontributory.  GEN: nad, alert and oriented HEENT: mucous membranes moist, tm w/o erythema, nasal exam w/o erythema, clear discharge noted,  OP with cobblestoning, sinuses ttp esp on R side of face NECK: supple w/o LA CV: rrr.   PULM: ctab, no inc wob EXT: no edema SKIN: no acute rash

## 2014-08-13 NOTE — Patient Instructions (Signed)
Star augmentin, use the nasal wash and then nasacort.   Continue with spiriva and albuterol in the meantime.  Drink plenty of fluids when you take guaifenesin.  Try to get some rest.  Take care.  Update me in about 2 days if needed.

## 2014-08-14 DIAGNOSIS — J019 Acute sinusitis, unspecified: Secondary | ICD-10-CM | POA: Insufficient documentation

## 2014-08-14 NOTE — Assessment & Plan Note (Signed)
Nontoxic, lungs ctab, likely sinus drainage leading to cough. Start augmentin, restart nasal steroid and continue nasal wash.  D/w pt.  He agrees.  He'll update me.  It doesn't seem reasonable to cancel his trip at this point.

## 2014-08-16 ENCOUNTER — Telehealth: Payer: Self-pay | Admitting: Family Medicine

## 2014-08-16 ENCOUNTER — Encounter: Payer: Self-pay | Admitting: Family Medicine

## 2014-08-16 NOTE — Telephone Encounter (Signed)
Please call them back.  If he is feeling a little better, then finish the abx and try to get some rest.  Since he isn't flying, he should be okay on the trip.  Thanks.

## 2014-08-16 NOTE — Telephone Encounter (Signed)
Wife calling stating that pt is still sick and they are leaving for a cruise on Saturday, per wife the feeling a little better but he's really dragging and still with a low grade fever, wife wanted to know if anything else could be done to get over this?

## 2014-08-16 NOTE — Telephone Encounter (Signed)
Patient and his wife notified as instructed by telephone.

## 2014-08-16 NOTE — Telephone Encounter (Signed)
Pt spouse called and request call back regarding Jamaine and his sickness.

## 2014-08-30 ENCOUNTER — Encounter: Payer: Self-pay | Admitting: Family Medicine

## 2014-08-31 ENCOUNTER — Other Ambulatory Visit: Payer: Self-pay | Admitting: Family Medicine

## 2014-08-31 MED ORDER — CITALOPRAM HYDROBROMIDE 40 MG PO TABS: 60.0000 mg | ORAL_TABLET | Freq: Every day | ORAL | Status: DC

## 2014-09-03 ENCOUNTER — Telehealth: Payer: Self-pay | Admitting: *Deleted

## 2014-09-03 NOTE — Telephone Encounter (Signed)
Patient called regarding stopping  Pravastatin and he is doing much better. Please call patient.

## 2014-09-03 NOTE — Telephone Encounter (Signed)
Per pt's last ov,  "Do a trial off pravastatin for 2 to 3 weeks for leg weakness  If better, call the office for alternate pill".  He states that he is doing better and would like to know your recommendations on how to proceed.

## 2014-09-04 NOTE — Telephone Encounter (Signed)
We could try an alternate cholesterol medication Would try simvastatin 20 mg daily He needs a cholesterol medication given the coronary blockages that he has

## 2014-09-05 DIAGNOSIS — M9903 Segmental and somatic dysfunction of lumbar region: Secondary | ICD-10-CM | POA: Diagnosis not present

## 2014-09-05 DIAGNOSIS — M5136 Other intervertebral disc degeneration, lumbar region: Secondary | ICD-10-CM | POA: Diagnosis not present

## 2014-09-05 DIAGNOSIS — M9901 Segmental and somatic dysfunction of cervical region: Secondary | ICD-10-CM | POA: Diagnosis not present

## 2014-09-05 DIAGNOSIS — M62830 Muscle spasm of back: Secondary | ICD-10-CM | POA: Diagnosis not present

## 2014-09-05 MED ORDER — SIMVASTATIN 20 MG PO TABS: 20.0000 mg | ORAL_TABLET | Freq: Every day | ORAL | Status: DC

## 2014-09-05 NOTE — Telephone Encounter (Signed)
Spoke w/ pt.  Advised him of Dr. Donivan Scull recommendation.  He verbalizes understanding and is agreeable.  Asked pt to call back w/ any questions or concerns.

## 2014-09-08 DIAGNOSIS — Z23 Encounter for immunization: Secondary | ICD-10-CM | POA: Diagnosis not present

## 2014-10-13 DIAGNOSIS — M9901 Segmental and somatic dysfunction of cervical region: Secondary | ICD-10-CM | POA: Diagnosis not present

## 2014-10-13 DIAGNOSIS — M9903 Segmental and somatic dysfunction of lumbar region: Secondary | ICD-10-CM | POA: Diagnosis not present

## 2014-10-13 DIAGNOSIS — M5136 Other intervertebral disc degeneration, lumbar region: Secondary | ICD-10-CM | POA: Diagnosis not present

## 2014-10-13 DIAGNOSIS — M62830 Muscle spasm of back: Secondary | ICD-10-CM | POA: Diagnosis not present

## 2014-10-16 DIAGNOSIS — M62830 Muscle spasm of back: Secondary | ICD-10-CM | POA: Diagnosis not present

## 2014-10-16 DIAGNOSIS — M9901 Segmental and somatic dysfunction of cervical region: Secondary | ICD-10-CM | POA: Diagnosis not present

## 2014-10-16 DIAGNOSIS — M5136 Other intervertebral disc degeneration, lumbar region: Secondary | ICD-10-CM | POA: Diagnosis not present

## 2014-10-16 DIAGNOSIS — M9903 Segmental and somatic dysfunction of lumbar region: Secondary | ICD-10-CM | POA: Diagnosis not present

## 2014-10-23 ENCOUNTER — Ambulatory Visit: Payer: Self-pay | Admitting: Internal Medicine

## 2014-10-23 DIAGNOSIS — I2699 Other pulmonary embolism without acute cor pulmonale: Secondary | ICD-10-CM

## 2014-10-23 HISTORY — DX: Other pulmonary embolism without acute cor pulmonale: I26.99

## 2014-10-25 ENCOUNTER — Encounter: Payer: Self-pay | Admitting: Family Medicine

## 2014-10-28 ENCOUNTER — Other Ambulatory Visit: Payer: Self-pay | Admitting: Family Medicine

## 2014-10-28 MED ORDER — CITALOPRAM HYDROBROMIDE 40 MG PO TABS: 60.0000 mg | ORAL_TABLET | Freq: Every day | ORAL | Status: DC

## 2014-11-04 ENCOUNTER — Inpatient Hospital Stay: Payer: Self-pay | Admitting: Internal Medicine

## 2014-11-04 DIAGNOSIS — D72829 Elevated white blood cell count, unspecified: Secondary | ICD-10-CM | POA: Diagnosis not present

## 2014-11-04 DIAGNOSIS — R079 Chest pain, unspecified: Secondary | ICD-10-CM | POA: Diagnosis not present

## 2014-11-04 DIAGNOSIS — R1011 Right upper quadrant pain: Secondary | ICD-10-CM | POA: Diagnosis not present

## 2014-11-04 DIAGNOSIS — J449 Chronic obstructive pulmonary disease, unspecified: Secondary | ICD-10-CM | POA: Diagnosis not present

## 2014-11-04 DIAGNOSIS — D696 Thrombocytopenia, unspecified: Secondary | ICD-10-CM | POA: Diagnosis not present

## 2014-11-04 DIAGNOSIS — I82402 Acute embolism and thrombosis of unspecified deep veins of left lower extremity: Secondary | ICD-10-CM | POA: Diagnosis not present

## 2014-11-04 DIAGNOSIS — M109 Gout, unspecified: Secondary | ICD-10-CM | POA: Diagnosis present

## 2014-11-04 DIAGNOSIS — I25110 Atherosclerotic heart disease of native coronary artery with unstable angina pectoris: Secondary | ICD-10-CM | POA: Diagnosis present

## 2014-11-04 DIAGNOSIS — I2699 Other pulmonary embolism without acute cor pulmonale: Secondary | ICD-10-CM | POA: Diagnosis not present

## 2014-11-04 DIAGNOSIS — J9600 Acute respiratory failure, unspecified whether with hypoxia or hypercapnia: Secondary | ICD-10-CM

## 2014-11-04 DIAGNOSIS — E119 Type 2 diabetes mellitus without complications: Secondary | ICD-10-CM | POA: Diagnosis not present

## 2014-11-04 DIAGNOSIS — I248 Other forms of acute ischemic heart disease: Secondary | ICD-10-CM | POA: Diagnosis not present

## 2014-11-04 DIAGNOSIS — K760 Fatty (change of) liver, not elsewhere classified: Secondary | ICD-10-CM | POA: Diagnosis not present

## 2014-11-04 DIAGNOSIS — I2690 Septic pulmonary embolism without acute cor pulmonale: Secondary | ICD-10-CM | POA: Diagnosis not present

## 2014-11-04 DIAGNOSIS — Z86718 Personal history of other venous thrombosis and embolism: Secondary | ICD-10-CM | POA: Diagnosis not present

## 2014-11-04 DIAGNOSIS — R109 Unspecified abdominal pain: Secondary | ICD-10-CM | POA: Diagnosis not present

## 2014-11-04 DIAGNOSIS — Z87891 Personal history of nicotine dependence: Secondary | ICD-10-CM | POA: Diagnosis not present

## 2014-11-04 DIAGNOSIS — F329 Major depressive disorder, single episode, unspecified: Secondary | ICD-10-CM | POA: Diagnosis present

## 2014-11-04 DIAGNOSIS — I82412 Acute embolism and thrombosis of left femoral vein: Secondary | ICD-10-CM | POA: Diagnosis not present

## 2014-11-04 DIAGNOSIS — E039 Hypothyroidism, unspecified: Secondary | ICD-10-CM | POA: Diagnosis present

## 2014-11-04 DIAGNOSIS — I82409 Acute embolism and thrombosis of unspecified deep veins of unspecified lower extremity: Secondary | ICD-10-CM | POA: Diagnosis not present

## 2014-11-04 DIAGNOSIS — R7989 Other specified abnormal findings of blood chemistry: Secondary | ICD-10-CM | POA: Diagnosis not present

## 2014-11-04 DIAGNOSIS — J441 Chronic obstructive pulmonary disease with (acute) exacerbation: Secondary | ICD-10-CM | POA: Diagnosis not present

## 2014-11-04 DIAGNOSIS — I361 Nonrheumatic tricuspid (valve) insufficiency: Secondary | ICD-10-CM | POA: Diagnosis not present

## 2014-11-04 DIAGNOSIS — Z7982 Long term (current) use of aspirin: Secondary | ICD-10-CM | POA: Diagnosis not present

## 2014-11-04 DIAGNOSIS — I249 Acute ischemic heart disease, unspecified: Secondary | ICD-10-CM | POA: Diagnosis not present

## 2014-11-04 DIAGNOSIS — I10 Essential (primary) hypertension: Secondary | ICD-10-CM | POA: Diagnosis not present

## 2014-11-04 DIAGNOSIS — G4733 Obstructive sleep apnea (adult) (pediatric): Secondary | ICD-10-CM | POA: Diagnosis present

## 2014-11-04 DIAGNOSIS — I2510 Atherosclerotic heart disease of native coronary artery without angina pectoris: Secondary | ICD-10-CM

## 2014-11-04 DIAGNOSIS — R062 Wheezing: Secondary | ICD-10-CM | POA: Diagnosis present

## 2014-11-04 DIAGNOSIS — J209 Acute bronchitis, unspecified: Secondary | ICD-10-CM | POA: Diagnosis not present

## 2014-11-04 DIAGNOSIS — R0902 Hypoxemia: Secondary | ICD-10-CM | POA: Diagnosis present

## 2014-11-04 DIAGNOSIS — R0602 Shortness of breath: Secondary | ICD-10-CM | POA: Diagnosis not present

## 2014-11-04 DIAGNOSIS — I452 Bifascicular block: Secondary | ICD-10-CM | POA: Diagnosis not present

## 2014-11-04 DIAGNOSIS — S22049A Unspecified fracture of fourth thoracic vertebra, initial encounter for closed fracture: Secondary | ICD-10-CM | POA: Diagnosis not present

## 2014-11-04 DIAGNOSIS — J439 Emphysema, unspecified: Secondary | ICD-10-CM | POA: Diagnosis not present

## 2014-11-04 DIAGNOSIS — R948 Abnormal results of function studies of other organs and systems: Secondary | ICD-10-CM | POA: Diagnosis not present

## 2014-11-04 DIAGNOSIS — C3490 Malignant neoplasm of unspecified part of unspecified bronchus or lung: Secondary | ICD-10-CM | POA: Diagnosis not present

## 2014-11-04 DIAGNOSIS — E785 Hyperlipidemia, unspecified: Secondary | ICD-10-CM | POA: Diagnosis present

## 2014-11-04 DIAGNOSIS — Z85819 Personal history of malignant neoplasm of unspecified site of lip, oral cavity, and pharynx: Secondary | ICD-10-CM | POA: Diagnosis not present

## 2014-11-04 DIAGNOSIS — I214 Non-ST elevation (NSTEMI) myocardial infarction: Secondary | ICD-10-CM | POA: Diagnosis not present

## 2014-11-04 DIAGNOSIS — R000 Tachycardia, unspecified: Secondary | ICD-10-CM | POA: Diagnosis present

## 2014-11-04 LAB — COMPREHENSIVE METABOLIC PANEL
ALK PHOS: 63 U/L
AST: 24 U/L (ref 15–37)
Albumin: 3.1 g/dL — ABNORMAL LOW (ref 3.4–5.0)
Anion Gap: 6 — ABNORMAL LOW (ref 7–16)
BILIRUBIN TOTAL: 0.5 mg/dL (ref 0.2–1.0)
BUN: 14 mg/dL (ref 7–18)
Calcium, Total: 8.8 mg/dL (ref 8.5–10.1)
Chloride: 105 mmol/L (ref 98–107)
Co2: 26 mmol/L (ref 21–32)
Creatinine: 1.09 mg/dL (ref 0.60–1.30)
EGFR (African American): >60
Glucose: 175 mg/dL — ABNORMAL HIGH (ref 65–99)
OSMOLALITY: 279 (ref 275–301)
POTASSIUM: 4 mmol/L (ref 3.5–5.1)
SGPT (ALT): 25 U/L
SODIUM: 137 mmol/L (ref 136–145)
Total Protein: 7.4 g/dL (ref 6.4–8.2)

## 2014-11-04 LAB — CBC
HCT: 45.2 % (ref 40.0–52.0)
HGB: 14.8 g/dL (ref 13.0–18.0)
MCH: 31.9 pg (ref 26.0–34.0)
MCHC: 32.8 g/dL (ref 32.0–36.0)
MCV: 97 fL (ref 80–100)
PLATELETS: 119 10*3/uL — AB (ref 150–440)
RBC: 4.65 10*6/uL (ref 4.40–5.90)
RDW: 15.1 % — AB (ref 11.5–14.5)
WBC: 11.3 10*3/uL — AB (ref 3.8–10.6)

## 2014-11-04 LAB — TROPONIN I
TROPONIN-I: 0.13 ng/mL — AB
TROPONIN-I: 0.07 ng/mL — AB
Troponin-I: 0.09 ng/mL — ABNORMAL HIGH

## 2014-11-04 LAB — D-DIMER(ARMC): D-Dimer: 4763 ng/ml

## 2014-11-04 LAB — APTT: ACTIVATED PTT: 27.3 s (ref 23.6–35.9)

## 2014-11-04 LAB — PROTIME-INR
INR: 1.1
Prothrombin Time: 13.6 secs (ref 11.5–14.7)

## 2014-11-04 LAB — HEPARIN LEVEL (UNFRACTIONATED)
ANTI-XA(UNFRACTIONATED): 0.42 [IU]/mL (ref 0.30–0.70)
Anti-Xa(Unfractionated): 0.12 IU/mL — ABNORMAL LOW (ref 0.30–0.70)

## 2014-11-05 ENCOUNTER — Ambulatory Visit: Payer: Self-pay | Admitting: Family Medicine

## 2014-11-05 LAB — CBC WITH DIFFERENTIAL/PLATELET
Basophil #: 0 10*3/uL (ref 0.0–0.1)
Basophil %: 0.2 %
EOS ABS: 0 10*3/uL (ref 0.0–0.7)
Eosinophil %: 0.1 %
HCT: 44.1 % (ref 40.0–52.0)
HGB: 14.2 g/dL (ref 13.0–18.0)
LYMPHS ABS: 1.3 10*3/uL (ref 1.0–3.6)
Lymphocyte %: 8.1 %
MCH: 31.5 pg (ref 26.0–34.0)
MCHC: 32.3 g/dL (ref 32.0–36.0)
MCV: 98 fL (ref 80–100)
MONO ABS: 1.5 x10 3/mm — AB (ref 0.2–1.0)
Monocyte %: 9.3 %
Neutrophil #: 13.6 10*3/uL — ABNORMAL HIGH (ref 1.4–6.5)
Neutrophil %: 82.3 %
Platelet: 121 10*3/uL — ABNORMAL LOW (ref 150–440)
RBC: 4.52 10*6/uL (ref 4.40–5.90)
RDW: 15.1 % — AB (ref 11.5–14.5)
WBC: 16.5 10*3/uL — ABNORMAL HIGH (ref 3.8–10.6)

## 2014-11-05 LAB — HEPARIN LEVEL (UNFRACTIONATED): ANTI-XA(UNFRACTIONATED): 0.32 [IU]/mL (ref 0.30–0.70)

## 2014-11-05 LAB — HEMOGLOBIN A1C
A1c: 6.3
Hemoglobin A1C: 6.3 % (ref 4.2–6.3)

## 2014-11-05 LAB — TSH: Thyroid Stimulating Horm: 0.679 u[IU]/mL

## 2014-11-06 LAB — CBC WITH DIFFERENTIAL/PLATELET
BASOS PCT: 0.3 %
Basophil #: 0 10*3/uL (ref 0.0–0.1)
Eosinophil #: 0.1 10*3/uL (ref 0.0–0.7)
Eosinophil %: 0.6 %
HCT: 45.4 % (ref 40.0–52.0)
HGB: 14.7 g/dL (ref 13.0–18.0)
Lymphocyte #: 1 10*3/uL (ref 1.0–3.6)
Lymphocyte %: 8 %
MCH: 31.7 pg (ref 26.0–34.0)
MCHC: 32.4 g/dL (ref 32.0–36.0)
MCV: 98 fL (ref 80–100)
MONO ABS: 1.4 x10 3/mm — AB (ref 0.2–1.0)
Monocyte %: 11.4 %
NEUTROS ABS: 9.9 10*3/uL — AB (ref 1.4–6.5)
Neutrophil %: 79.7 %
Platelet: 140 10*3/uL — ABNORMAL LOW (ref 150–440)
RBC: 4.64 10*6/uL (ref 4.40–5.90)
RDW: 15 % — AB (ref 11.5–14.5)
WBC: 12.4 10*3/uL — AB (ref 3.8–10.6)

## 2014-11-06 LAB — HEPATIC FUNCTION PANEL A (ARMC)
ALBUMIN: 3.2 g/dL — AB (ref 3.4–5.0)
ALK PHOS: 46 U/L
AST: 18 U/L (ref 15–37)
BILIRUBIN DIRECT: 0.1 mg/dL (ref 0.0–0.2)
Bilirubin,Total: 0.7 mg/dL (ref 0.2–1.0)
SGPT (ALT): 25 U/L
Total Protein: 7.6 g/dL (ref 6.4–8.2)

## 2014-11-06 LAB — HEPARIN LEVEL (UNFRACTIONATED): Anti-Xa(Unfractionated): 0.44 IU/mL (ref 0.30–0.70)

## 2014-11-07 LAB — CBC WITH DIFFERENTIAL/PLATELET
BASOS PCT: 0.5 %
Basophil #: 0 10*3/uL (ref 0.0–0.1)
Eosinophil #: 0.2 10*3/uL (ref 0.0–0.7)
Eosinophil %: 1.7 %
HCT: 44.5 % (ref 40.0–52.0)
HGB: 14.4 g/dL (ref 13.0–18.0)
Lymphocyte #: 1.3 10*3/uL (ref 1.0–3.6)
Lymphocyte %: 12.7 %
MCH: 31.7 pg (ref 26.0–34.0)
MCHC: 32.4 g/dL (ref 32.0–36.0)
MCV: 98 fL (ref 80–100)
MONO ABS: 1.4 x10 3/mm — AB (ref 0.2–1.0)
Monocyte %: 13.8 %
NEUTROS ABS: 7 10*3/uL — AB (ref 1.4–6.5)
NEUTROS PCT: 71.3 %
Platelet: 144 10*3/uL — ABNORMAL LOW (ref 150–440)
RBC: 4.55 10*6/uL (ref 4.40–5.90)
RDW: 14.9 % — ABNORMAL HIGH (ref 11.5–14.5)
WBC: 9.9 10*3/uL (ref 3.8–10.6)

## 2014-11-08 LAB — CREATININE, SERUM
Creatinine: 1.79 mg/dL — ABNORMAL HIGH (ref 0.60–1.30)
Creatinine: 1.58 mg/dL — ABNORMAL HIGH (ref 0.60–1.30)
EGFR (African American): 47 — ABNORMAL LOW
EGFR (Non-African Amer.): 39 — ABNORMAL LOW
GFR CALC AF AMER: 54 — AB
GFR CALC NON AF AMER: 45 — AB

## 2014-11-09 LAB — BASIC METABOLIC PANEL
Anion Gap: 5 — ABNORMAL LOW (ref 7–16)
BUN: 53 mg/dL — AB (ref 7–18)
CHLORIDE: 101 mmol/L (ref 98–107)
CO2: 30 mmol/L (ref 21–32)
Calcium, Total: 9.2 mg/dL (ref 8.5–10.1)
Creatinine: 1.51 mg/dL — ABNORMAL HIGH (ref 0.60–1.30)
EGFR (Non-African Amer.): 47 — ABNORMAL LOW
GFR CALC AF AMER: 57 — AB
GLUCOSE: 135 mg/dL — AB (ref 65–99)
Osmolality: 288 (ref 275–301)
Potassium: 4.6 mmol/L (ref 3.5–5.1)
Sodium: 136 mmol/L (ref 136–145)

## 2014-11-13 DIAGNOSIS — F329 Major depressive disorder, single episode, unspecified: Secondary | ICD-10-CM | POA: Diagnosis not present

## 2014-11-13 DIAGNOSIS — E119 Type 2 diabetes mellitus without complications: Secondary | ICD-10-CM | POA: Diagnosis not present

## 2014-11-13 DIAGNOSIS — E039 Hypothyroidism, unspecified: Secondary | ICD-10-CM | POA: Diagnosis not present

## 2014-11-13 DIAGNOSIS — Z5181 Encounter for therapeutic drug level monitoring: Secondary | ICD-10-CM | POA: Diagnosis not present

## 2014-11-13 DIAGNOSIS — I10 Essential (primary) hypertension: Secondary | ICD-10-CM | POA: Diagnosis not present

## 2014-11-13 DIAGNOSIS — I2699 Other pulmonary embolism without acute cor pulmonale: Secondary | ICD-10-CM | POA: Diagnosis not present

## 2014-11-13 DIAGNOSIS — E785 Hyperlipidemia, unspecified: Secondary | ICD-10-CM | POA: Diagnosis not present

## 2014-11-13 DIAGNOSIS — J449 Chronic obstructive pulmonary disease, unspecified: Secondary | ICD-10-CM | POA: Diagnosis not present

## 2014-11-13 DIAGNOSIS — R918 Other nonspecific abnormal finding of lung field: Secondary | ICD-10-CM | POA: Diagnosis not present

## 2014-11-13 DIAGNOSIS — M109 Gout, unspecified: Secondary | ICD-10-CM | POA: Diagnosis not present

## 2014-11-15 DIAGNOSIS — M109 Gout, unspecified: Secondary | ICD-10-CM | POA: Diagnosis not present

## 2014-11-15 DIAGNOSIS — R918 Other nonspecific abnormal finding of lung field: Secondary | ICD-10-CM | POA: Diagnosis not present

## 2014-11-15 DIAGNOSIS — J449 Chronic obstructive pulmonary disease, unspecified: Secondary | ICD-10-CM | POA: Diagnosis not present

## 2014-11-15 DIAGNOSIS — E119 Type 2 diabetes mellitus without complications: Secondary | ICD-10-CM | POA: Diagnosis not present

## 2014-11-15 DIAGNOSIS — F329 Major depressive disorder, single episode, unspecified: Secondary | ICD-10-CM | POA: Diagnosis not present

## 2014-11-15 DIAGNOSIS — I2699 Other pulmonary embolism without acute cor pulmonale: Secondary | ICD-10-CM | POA: Diagnosis not present

## 2014-11-19 ENCOUNTER — Encounter: Payer: Self-pay | Admitting: Family Medicine

## 2014-11-19 ENCOUNTER — Ambulatory Visit (INDEPENDENT_AMBULATORY_CARE_PROVIDER_SITE_OTHER): Payer: Medicare Other | Admitting: Family Medicine

## 2014-11-19 VITALS — BP 144/70 | HR 57 | Temp 98.6°F | Wt 211.0 lb

## 2014-11-19 DIAGNOSIS — I2699 Other pulmonary embolism without acute cor pulmonale: Secondary | ICD-10-CM | POA: Diagnosis not present

## 2014-11-19 DIAGNOSIS — I2510 Atherosclerotic heart disease of native coronary artery without angina pectoris: Secondary | ICD-10-CM | POA: Diagnosis not present

## 2014-11-19 MED ORDER — APIXABAN 5 MG PO TABS: 5.0000 mg | ORAL_TABLET | Freq: Two times a day (BID) | ORAL | Status: DC

## 2014-11-19 MED ORDER — METOPROLOL TARTRATE 25 MG PO TABS: 25.0000 mg | ORAL_TABLET | Freq: Two times a day (BID) | ORAL | Status: DC

## 2014-11-19 NOTE — Patient Instructions (Signed)
We'll contact you with your lab report. I'll await the follow up appointment with the hematology clinic.  Take care.  Glad to see you.

## 2014-11-19 NOTE — Progress Notes (Signed)
Pre visit review using our clinic review tool, if applicable. No additional management support is needed unless otherwise documented below in the visit note.  Admitted with CP/SOB, noted to have PE, DVT and possible lung mass with abnormal PET.  Hypoxia, now on O2, needs rx for home pulse ox meter, written and given to patient.  Started on eliquis and BB, needs refills on both.  Noted to have prev granulomatous lesion on CT earlier in 2015, d/w pt.  He has f/u with onc pending.  No ADE on eliquis or BB, compliant with O2, breathing much improved from prev. Exercise tolerance isn't back to baseline but is improving some daily.  Is on T replacement, see following phone note.   PMH and SH reviewed  ROS: See HPI, otherwise noncontributory.  Meds, vitals, and allergies reviewed.   GEN: nad, alert and oriented, on O2 HEENT: mucous membranes moist NECK: supple w/o LA CV: rrr.  PULM: ctab, no inc wob ABD: soft, +bs EXT: no edema SKIN: no acute rash

## 2014-11-20 ENCOUNTER — Encounter: Payer: Self-pay | Admitting: Family Medicine

## 2014-11-20 ENCOUNTER — Telehealth: Payer: Self-pay | Admitting: Family Medicine

## 2014-11-20 LAB — CREATININE, SERUM
Creatinine, Ser: 1.51
HCT: 45 %
Hemoglobin: 14.4 g/dL (ref 13–17)
PLATELET COUNT: 144
WBC: 9.9

## 2014-11-20 LAB — CBC WITH DIFFERENTIAL/PLATELET
BASOS ABS: 0 10*3/uL (ref 0.0–0.1)
Basophils Relative: 0.4 % (ref 0.0–3.0)
EOS ABS: 0.1 10*3/uL (ref 0.0–0.7)
Eosinophils Relative: 1.6 % (ref 0.0–5.0)
HCT: 42 % (ref 39.0–52.0)
HEMOGLOBIN: 13.6 g/dL (ref 13.0–17.0)
Lymphocytes Relative: 19.3 % (ref 12.0–46.0)
Lymphs Abs: 1.8 10*3/uL (ref 0.7–4.0)
MCHC: 32.4 g/dL (ref 30.0–36.0)
MCV: 95.1 fl (ref 78.0–100.0)
Monocytes Absolute: 0.5 10*3/uL (ref 0.1–1.0)
Monocytes Relative: 5.4 % (ref 3.0–12.0)
Neutro Abs: 6.7 10*3/uL (ref 1.4–7.7)
Neutrophils Relative %: 73.3 % (ref 43.0–77.0)
Platelets: 276 10*3/uL (ref 150.0–400.0)
RBC: 4.41 Mil/uL (ref 4.22–5.81)
RDW: 14.7 % (ref 11.5–15.5)
WBC: 9.1 10*3/uL (ref 4.0–10.5)

## 2014-11-20 LAB — BASIC METABOLIC PANEL
BUN: 17 mg/dL (ref 6–23)
CALCIUM: 9.4 mg/dL (ref 8.4–10.5)
CO2: 28 mEq/L (ref 19–32)
Chloride: 102 mEq/L (ref 96–112)
Creatinine, Ser: 1 mg/dL (ref 0.4–1.5)
GFR: 73.52 mL/min (ref >60.00)
Glucose, Bld: 73 mg/dL (ref 70–99)
Potassium: 4.4 mEq/L (ref 3.5–5.1)
SODIUM: 138 meq/L (ref 135–145)

## 2014-11-20 NOTE — Telephone Encounter (Signed)
Please call [facility], give a message to Dr. Ralene Muskrat staff.  Patient with PE and DVT.  Currently on testosterone replacement per Dr. Jeffie Pollock.   This is a FYI to Dr. Jeffie Pollock.  I didn't stop the medicine, but I asked patient to hold it until he heard from Dr. Jeffie Pollock. Thanks.

## 2014-11-20 NOTE — Assessment & Plan Note (Signed)
>  40 minutes spent in face to face time with patient, >50% spent in counselling or coordination of care.  Admitted with CP/SOB, noted to have PE, DVT and possible lung mass with abnormal PET.  Hypoxia, now on O2, needs rx for home pulse ox meter, written and given to patient.  Started on eliquis and BB, needs refills on both, done.  Noted to have prev granulomatous lesion on CT earlier in 2015, d/w pt.  He has f/u with onc pending.  No ADE on eliquis or BB, compliant with O2, breathing much improved from prev. Exercise tolerance isn't back to baseline but is improving some daily.  Is on T replacement, see following phone note.  I will await onc follow up.  D/w pt about path/phys of DVT/PE, rationale for anticoagulation.  D/w about the possible lung mass on CT.  He is clinically improved.  I'll await the onc notes before we make long term plans about anticoagulation.  He agrees.

## 2014-11-20 NOTE — Telephone Encounter (Signed)
Left message with receptionist at Dr. Ralene Muskrat office.

## 2014-11-21 ENCOUNTER — Encounter: Payer: Self-pay | Admitting: Family Medicine

## 2014-11-21 ENCOUNTER — Telehealth: Payer: Self-pay

## 2014-11-21 DIAGNOSIS — R918 Other nonspecific abnormal finding of lung field: Secondary | ICD-10-CM | POA: Diagnosis not present

## 2014-11-21 DIAGNOSIS — J449 Chronic obstructive pulmonary disease, unspecified: Secondary | ICD-10-CM | POA: Diagnosis not present

## 2014-11-21 DIAGNOSIS — I2699 Other pulmonary embolism without acute cor pulmonale: Secondary | ICD-10-CM | POA: Diagnosis not present

## 2014-11-21 DIAGNOSIS — M109 Gout, unspecified: Secondary | ICD-10-CM | POA: Diagnosis not present

## 2014-11-21 DIAGNOSIS — F329 Major depressive disorder, single episode, unspecified: Secondary | ICD-10-CM | POA: Diagnosis not present

## 2014-11-21 DIAGNOSIS — E119 Type 2 diabetes mellitus without complications: Secondary | ICD-10-CM | POA: Diagnosis not present

## 2014-11-21 NOTE — Telephone Encounter (Signed)
Orders given to Debbie by telephone as instructed.

## 2014-11-21 NOTE — Telephone Encounter (Signed)
Debbie nurse with [facility] left v/m requesting home health orders for PT; also request order for portable oxygen that pt could carry oxygen in a bag faxed to [phone]. Pt has oxygen in the home. Debbie request cb.

## 2014-11-21 NOTE — Telephone Encounter (Signed)
Please give the orders.  Thanks.

## 2014-11-23 ENCOUNTER — Ambulatory Visit: Payer: Self-pay | Admitting: Internal Medicine

## 2014-11-26 DIAGNOSIS — M109 Gout, unspecified: Secondary | ICD-10-CM | POA: Diagnosis not present

## 2014-11-26 DIAGNOSIS — E119 Type 2 diabetes mellitus without complications: Secondary | ICD-10-CM | POA: Diagnosis not present

## 2014-11-26 DIAGNOSIS — I2699 Other pulmonary embolism without acute cor pulmonale: Secondary | ICD-10-CM | POA: Diagnosis not present

## 2014-11-26 DIAGNOSIS — R918 Other nonspecific abnormal finding of lung field: Secondary | ICD-10-CM | POA: Diagnosis not present

## 2014-11-26 DIAGNOSIS — F329 Major depressive disorder, single episode, unspecified: Secondary | ICD-10-CM | POA: Diagnosis not present

## 2014-11-26 DIAGNOSIS — J449 Chronic obstructive pulmonary disease, unspecified: Secondary | ICD-10-CM | POA: Diagnosis not present

## 2014-11-30 DIAGNOSIS — R918 Other nonspecific abnormal finding of lung field: Secondary | ICD-10-CM | POA: Diagnosis not present

## 2014-11-30 DIAGNOSIS — E119 Type 2 diabetes mellitus without complications: Secondary | ICD-10-CM | POA: Diagnosis not present

## 2014-11-30 DIAGNOSIS — J449 Chronic obstructive pulmonary disease, unspecified: Secondary | ICD-10-CM | POA: Diagnosis not present

## 2014-11-30 DIAGNOSIS — F329 Major depressive disorder, single episode, unspecified: Secondary | ICD-10-CM | POA: Diagnosis not present

## 2014-11-30 DIAGNOSIS — I2699 Other pulmonary embolism without acute cor pulmonale: Secondary | ICD-10-CM | POA: Diagnosis not present

## 2014-11-30 DIAGNOSIS — M109 Gout, unspecified: Secondary | ICD-10-CM | POA: Diagnosis not present

## 2014-12-03 ENCOUNTER — Ambulatory Visit: Payer: Self-pay | Admitting: Internal Medicine

## 2014-12-03 DIAGNOSIS — Z86711 Personal history of pulmonary embolism: Secondary | ICD-10-CM | POA: Diagnosis not present

## 2014-12-03 DIAGNOSIS — Z87891 Personal history of nicotine dependence: Secondary | ICD-10-CM | POA: Diagnosis not present

## 2014-12-03 DIAGNOSIS — R531 Weakness: Secondary | ICD-10-CM | POA: Diagnosis not present

## 2014-12-03 DIAGNOSIS — Z7901 Long term (current) use of anticoagulants: Secondary | ICD-10-CM | POA: Diagnosis not present

## 2014-12-03 DIAGNOSIS — D696 Thrombocytopenia, unspecified: Secondary | ICD-10-CM | POA: Diagnosis not present

## 2014-12-03 DIAGNOSIS — K760 Fatty (change of) liver, not elsewhere classified: Secondary | ICD-10-CM | POA: Diagnosis not present

## 2014-12-03 DIAGNOSIS — R42 Dizziness and giddiness: Secondary | ICD-10-CM | POA: Diagnosis not present

## 2014-12-03 DIAGNOSIS — E039 Hypothyroidism, unspecified: Secondary | ICD-10-CM | POA: Diagnosis not present

## 2014-12-03 DIAGNOSIS — E8889 Other specified metabolic disorders: Secondary | ICD-10-CM | POA: Diagnosis not present

## 2014-12-03 DIAGNOSIS — J449 Chronic obstructive pulmonary disease, unspecified: Secondary | ICD-10-CM | POA: Diagnosis not present

## 2014-12-03 DIAGNOSIS — K766 Portal hypertension: Secondary | ICD-10-CM | POA: Diagnosis not present

## 2014-12-03 DIAGNOSIS — I7090 Unspecified atherosclerosis: Secondary | ICD-10-CM | POA: Diagnosis not present

## 2014-12-03 DIAGNOSIS — Z86718 Personal history of other venous thrombosis and embolism: Secondary | ICD-10-CM | POA: Diagnosis not present

## 2014-12-03 DIAGNOSIS — G4730 Sleep apnea, unspecified: Secondary | ICD-10-CM | POA: Diagnosis not present

## 2014-12-03 DIAGNOSIS — R918 Other nonspecific abnormal finding of lung field: Secondary | ICD-10-CM | POA: Diagnosis not present

## 2014-12-03 DIAGNOSIS — Z79899 Other long term (current) drug therapy: Secondary | ICD-10-CM | POA: Diagnosis not present

## 2014-12-03 DIAGNOSIS — M109 Gout, unspecified: Secondary | ICD-10-CM | POA: Diagnosis not present

## 2014-12-03 DIAGNOSIS — E119 Type 2 diabetes mellitus without complications: Secondary | ICD-10-CM | POA: Diagnosis not present

## 2014-12-03 DIAGNOSIS — Z7982 Long term (current) use of aspirin: Secondary | ICD-10-CM | POA: Diagnosis not present

## 2014-12-03 DIAGNOSIS — R05 Cough: Secondary | ICD-10-CM | POA: Diagnosis not present

## 2014-12-03 DIAGNOSIS — K7460 Unspecified cirrhosis of liver: Secondary | ICD-10-CM | POA: Diagnosis not present

## 2014-12-03 DIAGNOSIS — I10 Essential (primary) hypertension: Secondary | ICD-10-CM | POA: Diagnosis not present

## 2014-12-03 LAB — CBC CANCER CENTER
BASOS PCT: 0.9 %
Basophil #: 0.1 x10 3/mm (ref 0.0–0.1)
EOS PCT: 2.5 %
Eosinophil #: 0.2 x10 3/mm (ref 0.0–0.7)
HCT: 42.7 % (ref 40.0–52.0)
HGB: 13.9 g/dL (ref 13.0–18.0)
LYMPHS ABS: 1.4 x10 3/mm (ref 1.0–3.6)
Lymphocyte %: 19.6 %
MCH: 30.9 pg (ref 26.0–34.0)
MCHC: 32.7 g/dL (ref 32.0–36.0)
MCV: 95 fL (ref 80–100)
MONO ABS: 0.6 x10 3/mm (ref 0.2–1.0)
MONOS PCT: 9.1 %
NEUTROS ABS: 4.7 x10 3/mm (ref 1.4–6.5)
Neutrophil %: 67.9 %
Platelet: 121 x10 3/mm — ABNORMAL LOW (ref 150–440)
RBC: 4.51 10*6/uL (ref 4.40–5.90)
RDW: 14.3 % (ref 11.5–14.5)
WBC: 7 x10 3/mm (ref 3.8–10.6)

## 2014-12-03 LAB — CREATININE, SERUM
Creatinine: 1.16 mg/dL (ref 0.60–1.30)
EGFR (African American): >60
EGFR (Non-African Amer.): >60

## 2014-12-06 DIAGNOSIS — R918 Other nonspecific abnormal finding of lung field: Secondary | ICD-10-CM | POA: Diagnosis not present

## 2014-12-06 DIAGNOSIS — R42 Dizziness and giddiness: Secondary | ICD-10-CM | POA: Diagnosis not present

## 2014-12-06 DIAGNOSIS — D696 Thrombocytopenia, unspecified: Secondary | ICD-10-CM | POA: Diagnosis not present

## 2014-12-06 DIAGNOSIS — K766 Portal hypertension: Secondary | ICD-10-CM | POA: Diagnosis not present

## 2014-12-06 DIAGNOSIS — K760 Fatty (change of) liver, not elsewhere classified: Secondary | ICD-10-CM | POA: Diagnosis not present

## 2014-12-06 DIAGNOSIS — K7460 Unspecified cirrhosis of liver: Secondary | ICD-10-CM | POA: Diagnosis not present

## 2014-12-11 ENCOUNTER — Ambulatory Visit: Payer: Self-pay | Admitting: Internal Medicine

## 2014-12-11 DIAGNOSIS — Z79899 Other long term (current) drug therapy: Secondary | ICD-10-CM | POA: Diagnosis not present

## 2014-12-11 DIAGNOSIS — Z9049 Acquired absence of other specified parts of digestive tract: Secondary | ICD-10-CM | POA: Diagnosis not present

## 2014-12-11 DIAGNOSIS — Z85820 Personal history of malignant melanoma of skin: Secondary | ICD-10-CM | POA: Diagnosis not present

## 2014-12-11 DIAGNOSIS — Z8521 Personal history of malignant neoplasm of larynx: Secondary | ICD-10-CM | POA: Diagnosis not present

## 2014-12-11 DIAGNOSIS — H9193 Unspecified hearing loss, bilateral: Secondary | ICD-10-CM | POA: Diagnosis not present

## 2014-12-11 DIAGNOSIS — Z7982 Long term (current) use of aspirin: Secondary | ICD-10-CM | POA: Diagnosis not present

## 2014-12-11 DIAGNOSIS — J449 Chronic obstructive pulmonary disease, unspecified: Secondary | ICD-10-CM | POA: Diagnosis not present

## 2014-12-11 DIAGNOSIS — E119 Type 2 diabetes mellitus without complications: Secondary | ICD-10-CM | POA: Diagnosis not present

## 2014-12-11 DIAGNOSIS — Z9981 Dependence on supplemental oxygen: Secondary | ICD-10-CM | POA: Diagnosis not present

## 2014-12-11 DIAGNOSIS — Z9989 Dependence on other enabling machines and devices: Secondary | ICD-10-CM | POA: Diagnosis not present

## 2014-12-11 DIAGNOSIS — Z974 Presence of external hearing-aid: Secondary | ICD-10-CM | POA: Diagnosis not present

## 2014-12-11 DIAGNOSIS — Z9889 Other specified postprocedural states: Secondary | ICD-10-CM | POA: Diagnosis not present

## 2014-12-11 DIAGNOSIS — I10 Essential (primary) hypertension: Secondary | ICD-10-CM | POA: Diagnosis not present

## 2014-12-11 DIAGNOSIS — R911 Solitary pulmonary nodule: Secondary | ICD-10-CM | POA: Diagnosis not present

## 2014-12-11 DIAGNOSIS — Z7901 Long term (current) use of anticoagulants: Secondary | ICD-10-CM | POA: Diagnosis not present

## 2014-12-11 DIAGNOSIS — Z8639 Personal history of other endocrine, nutritional and metabolic disease: Secondary | ICD-10-CM | POA: Diagnosis not present

## 2014-12-11 DIAGNOSIS — G4730 Sleep apnea, unspecified: Secondary | ICD-10-CM | POA: Diagnosis not present

## 2014-12-11 DIAGNOSIS — Z87891 Personal history of nicotine dependence: Secondary | ICD-10-CM | POA: Diagnosis not present

## 2014-12-11 DIAGNOSIS — R918 Other nonspecific abnormal finding of lung field: Secondary | ICD-10-CM | POA: Diagnosis not present

## 2014-12-12 ENCOUNTER — Encounter: Payer: Self-pay | Admitting: Family Medicine

## 2014-12-12 DIAGNOSIS — M109 Gout, unspecified: Secondary | ICD-10-CM | POA: Diagnosis not present

## 2014-12-12 DIAGNOSIS — I2699 Other pulmonary embolism without acute cor pulmonale: Secondary | ICD-10-CM | POA: Diagnosis not present

## 2014-12-12 DIAGNOSIS — J449 Chronic obstructive pulmonary disease, unspecified: Secondary | ICD-10-CM | POA: Diagnosis not present

## 2014-12-12 DIAGNOSIS — E119 Type 2 diabetes mellitus without complications: Secondary | ICD-10-CM | POA: Diagnosis not present

## 2014-12-12 DIAGNOSIS — F329 Major depressive disorder, single episode, unspecified: Secondary | ICD-10-CM | POA: Diagnosis not present

## 2014-12-12 DIAGNOSIS — R918 Other nonspecific abnormal finding of lung field: Secondary | ICD-10-CM | POA: Diagnosis not present

## 2014-12-16 ENCOUNTER — Encounter: Payer: Self-pay | Admitting: Family Medicine

## 2014-12-16 DIAGNOSIS — J984 Other disorders of lung: Secondary | ICD-10-CM | POA: Insufficient documentation

## 2014-12-18 DIAGNOSIS — K7460 Unspecified cirrhosis of liver: Secondary | ICD-10-CM | POA: Diagnosis not present

## 2014-12-18 DIAGNOSIS — K766 Portal hypertension: Secondary | ICD-10-CM | POA: Diagnosis not present

## 2014-12-18 DIAGNOSIS — K760 Fatty (change of) liver, not elsewhere classified: Secondary | ICD-10-CM | POA: Diagnosis not present

## 2014-12-18 DIAGNOSIS — D696 Thrombocytopenia, unspecified: Secondary | ICD-10-CM | POA: Diagnosis not present

## 2014-12-18 DIAGNOSIS — R918 Other nonspecific abnormal finding of lung field: Secondary | ICD-10-CM | POA: Diagnosis not present

## 2014-12-18 DIAGNOSIS — R42 Dizziness and giddiness: Secondary | ICD-10-CM | POA: Diagnosis not present

## 2014-12-24 ENCOUNTER — Ambulatory Visit: Payer: Self-pay | Admitting: Internal Medicine

## 2014-12-24 DIAGNOSIS — M9903 Segmental and somatic dysfunction of lumbar region: Secondary | ICD-10-CM | POA: Diagnosis not present

## 2014-12-24 DIAGNOSIS — I7090 Unspecified atherosclerosis: Secondary | ICD-10-CM | POA: Diagnosis not present

## 2014-12-24 DIAGNOSIS — M109 Gout, unspecified: Secondary | ICD-10-CM | POA: Diagnosis not present

## 2014-12-24 DIAGNOSIS — D696 Thrombocytopenia, unspecified: Secondary | ICD-10-CM | POA: Diagnosis not present

## 2014-12-24 DIAGNOSIS — R918 Other nonspecific abnormal finding of lung field: Secondary | ICD-10-CM | POA: Diagnosis not present

## 2014-12-24 DIAGNOSIS — Z86711 Personal history of pulmonary embolism: Secondary | ICD-10-CM | POA: Diagnosis not present

## 2014-12-24 DIAGNOSIS — M62830 Muscle spasm of back: Secondary | ICD-10-CM | POA: Diagnosis not present

## 2014-12-24 DIAGNOSIS — R531 Weakness: Secondary | ICD-10-CM | POA: Diagnosis not present

## 2014-12-24 DIAGNOSIS — R42 Dizziness and giddiness: Secondary | ICD-10-CM | POA: Diagnosis not present

## 2014-12-24 DIAGNOSIS — Z7901 Long term (current) use of anticoagulants: Secondary | ICD-10-CM | POA: Diagnosis not present

## 2014-12-24 DIAGNOSIS — G4730 Sleep apnea, unspecified: Secondary | ICD-10-CM | POA: Diagnosis not present

## 2014-12-24 DIAGNOSIS — Z7982 Long term (current) use of aspirin: Secondary | ICD-10-CM | POA: Diagnosis not present

## 2014-12-24 DIAGNOSIS — E039 Hypothyroidism, unspecified: Secondary | ICD-10-CM | POA: Diagnosis not present

## 2014-12-24 DIAGNOSIS — E119 Type 2 diabetes mellitus without complications: Secondary | ICD-10-CM | POA: Diagnosis not present

## 2014-12-24 DIAGNOSIS — Z86718 Personal history of other venous thrombosis and embolism: Secondary | ICD-10-CM | POA: Diagnosis not present

## 2014-12-24 DIAGNOSIS — Z87891 Personal history of nicotine dependence: Secondary | ICD-10-CM | POA: Diagnosis not present

## 2014-12-24 DIAGNOSIS — I10 Essential (primary) hypertension: Secondary | ICD-10-CM | POA: Diagnosis not present

## 2014-12-24 DIAGNOSIS — Z79899 Other long term (current) drug therapy: Secondary | ICD-10-CM | POA: Diagnosis not present

## 2014-12-24 DIAGNOSIS — M5136 Other intervertebral disc degeneration, lumbar region: Secondary | ICD-10-CM | POA: Diagnosis not present

## 2014-12-24 DIAGNOSIS — K760 Fatty (change of) liver, not elsewhere classified: Secondary | ICD-10-CM | POA: Diagnosis not present

## 2014-12-24 DIAGNOSIS — R05 Cough: Secondary | ICD-10-CM | POA: Diagnosis not present

## 2014-12-24 DIAGNOSIS — J449 Chronic obstructive pulmonary disease, unspecified: Secondary | ICD-10-CM | POA: Diagnosis not present

## 2014-12-24 DIAGNOSIS — M9901 Segmental and somatic dysfunction of cervical region: Secondary | ICD-10-CM | POA: Diagnosis not present

## 2014-12-25 DIAGNOSIS — J449 Chronic obstructive pulmonary disease, unspecified: Secondary | ICD-10-CM | POA: Diagnosis not present

## 2014-12-25 DIAGNOSIS — F329 Major depressive disorder, single episode, unspecified: Secondary | ICD-10-CM | POA: Diagnosis not present

## 2014-12-25 DIAGNOSIS — M109 Gout, unspecified: Secondary | ICD-10-CM | POA: Diagnosis not present

## 2014-12-25 DIAGNOSIS — E119 Type 2 diabetes mellitus without complications: Secondary | ICD-10-CM | POA: Diagnosis not present

## 2014-12-25 DIAGNOSIS — R918 Other nonspecific abnormal finding of lung field: Secondary | ICD-10-CM | POA: Diagnosis not present

## 2014-12-25 DIAGNOSIS — I2699 Other pulmonary embolism without acute cor pulmonale: Secondary | ICD-10-CM | POA: Diagnosis not present

## 2014-12-26 DIAGNOSIS — R351 Nocturia: Secondary | ICD-10-CM | POA: Diagnosis not present

## 2014-12-26 DIAGNOSIS — N3941 Urge incontinence: Secondary | ICD-10-CM | POA: Diagnosis not present

## 2014-12-26 DIAGNOSIS — N5201 Erectile dysfunction due to arterial insufficiency: Secondary | ICD-10-CM | POA: Diagnosis not present

## 2014-12-26 DIAGNOSIS — N401 Enlarged prostate with lower urinary tract symptoms: Secondary | ICD-10-CM | POA: Diagnosis not present

## 2014-12-26 DIAGNOSIS — E291 Testicular hypofunction: Secondary | ICD-10-CM | POA: Diagnosis not present

## 2014-12-31 ENCOUNTER — Telehealth: Payer: Self-pay

## 2014-12-31 NOTE — Telephone Encounter (Signed)
Nurse with [facility] called, states pt was scheduled for lung bx,due to scanner being down, was unable to perform scan, pt has been off Eliquis for 48 hours, asks if it is ok for pt to stay off of Eliquis until scanner is fixed, she is not sure when it will be working, possibly Thurs of Friday, states also pt has IVC filter. Please call and advise.

## 2014-12-31 NOTE — Telephone Encounter (Signed)
Spoke w/ Christopher Santiago.  Advised her of Christopher Santiago's recommendation.  She states that Christopher Santiago states that he got cardiac clearance to stop the Eliquis, though we have no documentation of this. She reports that Christopher Santiago cannot have lung biopsy until this test is done and they are hoping to have this done this week, if the scanner is working.  She would like to know if Christopher Santiago can be bridged w/ Lovenox.   Spoke w/ Christopher Faith, PA.  He recommends Lovenox 40 mg BID for intermediate dosing until procedure, stop 48 hrs prior to biopsy.  Spoke w/ Christopher Santiago.  He is agreeable to this and will keep appt tomorrow w/ Christopher Santiago.   Christopher Santiago called from the hospital, stating that he spoke w/ Christopher Santiago and they are not sure when the scanner will be up and running.  He recommends that Christopher Santiago continue Eliquis for now rather than starting the Lovenox. Spoke w/ Christopher Santiago and and advised him of Dr. Donivan Scull recommendation.  He is agreeable and will keep hs appt tomorrow.

## 2014-12-31 NOTE — Telephone Encounter (Signed)
Given we are not certain how long the scanner will be down, possibly Thursday or Friday does not mean Thurday or Friday, and he has been advised to not stop this medication he needs to restart this medication today unless they can provide him with an alternative site. He will have to stop the medication 48 hours prior to next procedure date.

## 2015-01-01 ENCOUNTER — Telehealth: Payer: Self-pay | Admitting: *Deleted

## 2015-01-01 ENCOUNTER — Encounter: Payer: Self-pay | Admitting: Cardiovascular Disease

## 2015-01-01 ENCOUNTER — Ambulatory Visit (INDEPENDENT_AMBULATORY_CARE_PROVIDER_SITE_OTHER): Payer: Medicare Other | Admitting: Cardiovascular Disease

## 2015-01-01 VITALS — BP 122/64 | HR 57 | Ht 69.0 in | Wt 209.2 lb

## 2015-01-01 DIAGNOSIS — I739 Peripheral vascular disease, unspecified: Secondary | ICD-10-CM

## 2015-01-01 DIAGNOSIS — I2583 Coronary atherosclerosis due to lipid rich plaque: Secondary | ICD-10-CM

## 2015-01-01 DIAGNOSIS — I2699 Other pulmonary embolism without acute cor pulmonale: Secondary | ICD-10-CM | POA: Diagnosis not present

## 2015-01-01 DIAGNOSIS — I10 Essential (primary) hypertension: Secondary | ICD-10-CM

## 2015-01-01 DIAGNOSIS — J984 Other disorders of lung: Secondary | ICD-10-CM

## 2015-01-01 DIAGNOSIS — J438 Other emphysema: Secondary | ICD-10-CM | POA: Diagnosis not present

## 2015-01-01 DIAGNOSIS — I2510 Atherosclerotic heart disease of native coronary artery without angina pectoris: Secondary | ICD-10-CM

## 2015-01-01 NOTE — Telephone Encounter (Signed)
Nurse calling to see if we could rsch the lung biopsy, but need direction on blood thinner.

## 2015-01-01 NOTE — Assessment & Plan Note (Signed)
Currently with no symptoms of angina. No further workup at this time. Continue current medication regimen. 

## 2015-01-01 NOTE — Telephone Encounter (Signed)
Left message for Christopher Santiago that pt was seen this am and Dr. Rockey Situ recommends that he hold his Eliquis 2 days prior to procedure.  Asked her to call back w/ any questions or concerns.

## 2015-01-01 NOTE — Assessment & Plan Note (Signed)
Blood pressure is well controlled on today's visit. No changes made to the medications. 

## 2015-01-01 NOTE — Assessment & Plan Note (Signed)
He is scheduled for biopsy possibly next week. Followed by Dr. Cynda Acres
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
## 2015-01-01 NOTE — Assessment & Plan Note (Addendum)
Suggested he restart anticoagulation last night which she did, continue on anticoagulation until rescheduled for his biopsy. Would hold anticoagulation 2 days prior. He has IVC filter in place

## 2015-01-01 NOTE — Assessment & Plan Note (Signed)
Known lower extremity arterial disease. We'll ask him in follow-up if he has follow-up with vascular surgery or whether we should be doing repeat annual scans

## 2015-01-01 NOTE — Assessment & Plan Note (Signed)
Stable shortness of breath. Suggested he continue on his inhalers

## 2015-01-01 NOTE — Patient Instructions (Addendum)
You are doing well.  Decrease the metoprolol down to 1/2 twice a day  Ok to hold the eliquis for 2 days prior to the procedure  Please call us if you have new issues that need to be addressed before your next appt.  Your physician wants you to follow-up in: 6 months.  You will receive a reminder letter in the mail two months in advance. If you don't receive a letter, please call our office to schedule the follow-up appointment.

## 2015-01-09 ENCOUNTER — Ambulatory Visit: Payer: Self-pay | Admitting: Oncology

## 2015-01-09 DIAGNOSIS — Z7982 Long term (current) use of aspirin: Secondary | ICD-10-CM | POA: Diagnosis not present

## 2015-01-09 DIAGNOSIS — E039 Hypothyroidism, unspecified: Secondary | ICD-10-CM | POA: Diagnosis not present

## 2015-01-09 DIAGNOSIS — J8410 Pulmonary fibrosis, unspecified: Secondary | ICD-10-CM | POA: Diagnosis not present

## 2015-01-09 DIAGNOSIS — Z7901 Long term (current) use of anticoagulants: Secondary | ICD-10-CM | POA: Diagnosis not present

## 2015-01-09 DIAGNOSIS — Z86718 Personal history of other venous thrombosis and embolism: Secondary | ICD-10-CM | POA: Diagnosis not present

## 2015-01-09 DIAGNOSIS — Z87891 Personal history of nicotine dependence: Secondary | ICD-10-CM | POA: Diagnosis not present

## 2015-01-09 DIAGNOSIS — R918 Other nonspecific abnormal finding of lung field: Secondary | ICD-10-CM | POA: Diagnosis not present

## 2015-01-09 DIAGNOSIS — K760 Fatty (change of) liver, not elsewhere classified: Secondary | ICD-10-CM | POA: Diagnosis not present

## 2015-01-09 DIAGNOSIS — R911 Solitary pulmonary nodule: Secondary | ICD-10-CM | POA: Diagnosis not present

## 2015-01-09 DIAGNOSIS — M3130 Wegener's granulomatosis without renal involvement: Secondary | ICD-10-CM | POA: Diagnosis not present

## 2015-01-09 DIAGNOSIS — M109 Gout, unspecified: Secondary | ICD-10-CM | POA: Diagnosis not present

## 2015-01-09 DIAGNOSIS — I10 Essential (primary) hypertension: Secondary | ICD-10-CM | POA: Diagnosis not present

## 2015-01-09 DIAGNOSIS — G4730 Sleep apnea, unspecified: Secondary | ICD-10-CM | POA: Diagnosis not present

## 2015-01-09 DIAGNOSIS — Z9889 Other specified postprocedural states: Secondary | ICD-10-CM | POA: Diagnosis not present

## 2015-01-09 DIAGNOSIS — Z79899 Other long term (current) drug therapy: Secondary | ICD-10-CM | POA: Diagnosis not present

## 2015-01-15 DIAGNOSIS — M9903 Segmental and somatic dysfunction of lumbar region: Secondary | ICD-10-CM | POA: Diagnosis not present

## 2015-01-15 DIAGNOSIS — M9901 Segmental and somatic dysfunction of cervical region: Secondary | ICD-10-CM | POA: Diagnosis not present

## 2015-01-15 DIAGNOSIS — M62830 Muscle spasm of back: Secondary | ICD-10-CM | POA: Diagnosis not present

## 2015-01-15 DIAGNOSIS — M5136 Other intervertebral disc degeneration, lumbar region: Secondary | ICD-10-CM | POA: Diagnosis not present

## 2015-01-21 DIAGNOSIS — D696 Thrombocytopenia, unspecified: Secondary | ICD-10-CM | POA: Diagnosis not present

## 2015-01-21 DIAGNOSIS — R42 Dizziness and giddiness: Secondary | ICD-10-CM | POA: Diagnosis not present

## 2015-01-21 DIAGNOSIS — K760 Fatty (change of) liver, not elsewhere classified: Secondary | ICD-10-CM | POA: Diagnosis not present

## 2015-01-21 DIAGNOSIS — R05 Cough: Secondary | ICD-10-CM | POA: Diagnosis not present

## 2015-01-21 DIAGNOSIS — R531 Weakness: Secondary | ICD-10-CM | POA: Diagnosis not present

## 2015-01-21 DIAGNOSIS — R918 Other nonspecific abnormal finding of lung field: Secondary | ICD-10-CM | POA: Diagnosis not present

## 2015-01-24 DIAGNOSIS — Z961 Presence of intraocular lens: Secondary | ICD-10-CM | POA: Diagnosis not present

## 2015-01-24 DIAGNOSIS — E119 Type 2 diabetes mellitus without complications: Secondary | ICD-10-CM | POA: Diagnosis not present

## 2015-01-24 DIAGNOSIS — H01004 Unspecified blepharitis left upper eyelid: Secondary | ICD-10-CM | POA: Diagnosis not present

## 2015-01-24 DIAGNOSIS — H01005 Unspecified blepharitis left lower eyelid: Secondary | ICD-10-CM | POA: Diagnosis not present

## 2015-01-24 LAB — HM DIABETES EYE EXAM

## 2015-02-04 DIAGNOSIS — M9903 Segmental and somatic dysfunction of lumbar region: Secondary | ICD-10-CM | POA: Diagnosis not present

## 2015-02-04 DIAGNOSIS — M5136 Other intervertebral disc degeneration, lumbar region: Secondary | ICD-10-CM | POA: Diagnosis not present

## 2015-02-04 DIAGNOSIS — M9901 Segmental and somatic dysfunction of cervical region: Secondary | ICD-10-CM | POA: Diagnosis not present

## 2015-02-04 DIAGNOSIS — M62830 Muscle spasm of back: Secondary | ICD-10-CM | POA: Diagnosis not present

## 2015-02-07 ENCOUNTER — Encounter: Payer: Self-pay | Admitting: Family Medicine

## 2015-02-11 ENCOUNTER — Ambulatory Visit: Payer: Self-pay | Admitting: Internal Medicine

## 2015-02-11 DIAGNOSIS — J8410 Pulmonary fibrosis, unspecified: Secondary | ICD-10-CM | POA: Diagnosis not present

## 2015-02-11 DIAGNOSIS — R918 Other nonspecific abnormal finding of lung field: Secondary | ICD-10-CM | POA: Diagnosis not present

## 2015-02-11 DIAGNOSIS — R911 Solitary pulmonary nodule: Secondary | ICD-10-CM | POA: Diagnosis not present

## 2015-02-18 ENCOUNTER — Ambulatory Visit
Admit: 2015-02-18 | Disposition: A | Discharge status: Home / Self Care | Payer: Self-pay | Attending: Internal Medicine | Admitting: Internal Medicine

## 2015-02-18 LAB — CBC CANCER CENTER
BASOS ABS: 0 x10 3/mm (ref 0.0–0.1)
BASOS PCT: 0.6 %
EOS PCT: 2.4 %
Eosinophil #: 0.2 x10 3/mm (ref 0.0–0.7)
HCT: 38.2 % — AB (ref 40.0–52.0)
HGB: 12.7 g/dL — ABNORMAL LOW (ref 13.0–18.0)
LYMPHS ABS: 1.6 x10 3/mm (ref 1.0–3.6)
Lymphocyte %: 24.4 %
MCH: 31.7 pg (ref 26.0–34.0)
MCHC: 33.4 g/dL (ref 32.0–36.0)
MCV: 95 fL (ref 80–100)
Monocyte #: 0.5 x10 3/mm (ref 0.2–1.0)
Monocyte %: 8.3 %
Neutrophil #: 4.2 x10 3/mm (ref 1.4–6.5)
Neutrophil %: 64.3 %
Platelet: 147 x10 3/mm — ABNORMAL LOW (ref 150–440)
RBC: 4.02 10*6/uL — AB (ref 4.40–5.90)
RDW: 14.1 % (ref 11.5–14.5)
WBC: 6.5 x10 3/mm (ref 3.8–10.6)

## 2015-02-20 LAB — CEA: CEA: 2.8 ng/mL (ref 0.0–4.7)

## 2015-02-20 LAB — PROT IMMUNOELECTROPHORES(ARMC)

## 2015-02-20 LAB — CANCER ANTIGEN 19-9: CA 19 9: 12 U/mL (ref 0–35)

## 2015-02-21 ENCOUNTER — Other Ambulatory Visit: Payer: Self-pay | Admitting: *Deleted

## 2015-02-21 MED ORDER — METOPROLOL TARTRATE 25 MG PO TABS: 25.0000 mg | ORAL_TABLET | Freq: Two times a day (BID) | ORAL | Status: DC

## 2015-02-22 ENCOUNTER — Ambulatory Visit
Admit: 2015-02-22 | Disposition: A | Discharge status: Home / Self Care | Payer: Self-pay | Attending: Internal Medicine | Admitting: Internal Medicine

## 2015-02-26 DIAGNOSIS — M9903 Segmental and somatic dysfunction of lumbar region: Secondary | ICD-10-CM | POA: Diagnosis not present

## 2015-02-26 DIAGNOSIS — M5136 Other intervertebral disc degeneration, lumbar region: Secondary | ICD-10-CM | POA: Diagnosis not present

## 2015-02-26 DIAGNOSIS — M62830 Muscle spasm of back: Secondary | ICD-10-CM | POA: Diagnosis not present

## 2015-02-26 DIAGNOSIS — M9901 Segmental and somatic dysfunction of cervical region: Secondary | ICD-10-CM | POA: Diagnosis not present

## 2015-03-11 DIAGNOSIS — M9901 Segmental and somatic dysfunction of cervical region: Secondary | ICD-10-CM | POA: Diagnosis not present

## 2015-03-11 DIAGNOSIS — M62830 Muscle spasm of back: Secondary | ICD-10-CM | POA: Diagnosis not present

## 2015-03-11 DIAGNOSIS — M5136 Other intervertebral disc degeneration, lumbar region: Secondary | ICD-10-CM | POA: Diagnosis not present

## 2015-03-11 DIAGNOSIS — M9903 Segmental and somatic dysfunction of lumbar region: Secondary | ICD-10-CM | POA: Diagnosis not present

## 2015-03-13 DIAGNOSIS — M5136 Other intervertebral disc degeneration, lumbar region: Secondary | ICD-10-CM | POA: Diagnosis not present

## 2015-03-13 DIAGNOSIS — M9903 Segmental and somatic dysfunction of lumbar region: Secondary | ICD-10-CM | POA: Diagnosis not present

## 2015-03-13 DIAGNOSIS — M62830 Muscle spasm of back: Secondary | ICD-10-CM | POA: Diagnosis not present

## 2015-03-13 DIAGNOSIS — M9901 Segmental and somatic dysfunction of cervical region: Secondary | ICD-10-CM | POA: Diagnosis not present

## 2015-03-16 NOTE — Discharge Summary (Signed)
PATIENT NAME:  Christopher Santiago, Christopher Santiago MR#:  [id number] DATE OF BIRTH:  [birth year]  DATE OF ADMISSION:  11/04/2014 DATE OF DISCHARGE:  11/09/2014  DISCHARGE DIAGNOSES: 1. Pulmonary embolism status post inferior vena cava filter, now on Eliquis. 2. Lung mass requiring outpatient follow up with oncology.  3. Troponin elevation due to supply-demand ischemia.   SECONDARY DIAGNOSES: 1. Chronic obstructive pulmonary disease.  2. Hypertension.  3. Hyperlipidemia.  4. Diabetes.  5. Gout.  6. Hypothyroidism.  7. Depression.  8. Sleep apnea.   CONSULTATIONS:  1. Vascular surgery, Dr. Leotis Pain.  2. Cardiology, Dr. Virl Axe.  3. Oncology, Dr. Barbette Reichmann.  4. Pulmonary, Dr. Devona Konig.   PROCEDURES AND RADIOLOGY:  1. IVC filter placement on 12/14 by Dr. Leotis Pain.  2. Bilateral lower extremity Doppler on 12/13 showed nearly occlusive thrombus involving the central portion of the left femoral vein and profunda femoral vein. No evidence of right lower extremity DVT.  3. Chest x-ray on 12/13 showed emphysematous changes. No active cardiopulmonary disease.  4. CT scan of the chest on 12/13 showed acute large burden of bilateral pulmonary emboli with right heart strain, consistent with submassive PE. Small pericardial effusion. Ascending aortic aneurysm. Right upper lobe spiculated/cavitary mass suspicious for malignancy. Irregular calcified mass in the right upper lobe. Right lower lobe infiltrate/infarct. Right hilar lymph node, worrisome for metastatic disease. Superior endplate fracture of T4 of indeterminate age.  5. Abdominal x-ray on 12/15 showed no acute findings. Placement of IVC filter in stable position.  6. Chest x-ray on 12/15 showed stable exam. COPD.  7. Abdominal ultrasound of 12/15 showed no acute abnormality. Fatty infiltration of the liver.  8. PET scan on 12/17 showed hypermetabolic cavitary right upper lobe lesion, worrisome for possible malignancy, although cannot rule out  inflammatory foci. Subacute right lower lobe infarction within the right pulmonary artery region, adjacent calcified hilar lymph nodes consistent with prior granulomatous disease. Hepatic steatosis.  9. A 2-D echocardiogram on 12/13 showed LVEF of 55% to 60%. Impaired relaxation of LV diastolic filling. Mild concentric LVH. Mild tricuspid regurgitation.   HISTORY AND SHORT HOSPITAL COURSE: The patient is an 81 year old male with the above-mentioned medical problems who was admitted for hypoxia and COPD exacerbation. He was also found to have elevated troponin, which was thought to be due to supply-demand ischemia. He underwent CT scan of the chest, as there was concern for PE and this did come out positive for which vascular surgery consultation was obtained with Dr. Leotis Pain, as there was an at least submassive PE for which he recommended IVC filter placement, which was done on 12/14 without any immediate complication. Cardiology consultation was obtained with Dr. Virl Axe who recommended 2-D echocardiogram, which was obtained with the results dictated above. Pulmonary consultation was obtained with Dr. Devona Konig who recommended continuing ongoing management of PE and possible oncology consultation for evaluation of the spiculated mass seen on CT scan. Oncology consultation was obtained with Dr. Barbette Reichmann who recommended PET scan, which was performed. He recommended ongoing treatment of PE and outpatient followup for the lung mass, as this could have been possible granulomatous disease or sequela of thromboembolism. The patient was feeling significantly better by 12/18 and was discharged home in stable condition.   PHYSICAL EXAMINATION: On the date of discharge: VITAL SIGNS: Are vital signs are as follows: Temperature 97.6, heart rate 63 per minute, respirations 20 per minute, blood pressure 113/66. He was saturating 82% with exertion on room air and  96% at rest on room air, so he was qualified for  2 liters of oxygen, especially on ambulation.   CARDIOVASCULAR: S1, S2 normal. No murmurs, rubs or gallop.   LUNGS: Clear to auscultation bilaterally. No rales and rales, rhonchi or crepitation.   ABDOMEN: Soft, benign.   NEUROLOGIC: Nonfocal examination. All other physical examination remained at baseline.   DISCHARGE MEDICATIONS:   Medication Instructions  allopurinol 100 mg oral tablet  1  orally 2 times a day    vitamin d  1000 international unit(s) orally once a day   armour thyroid 30 mg oral tablet  1 tab(s) orally once a day   testosterone  200 milligram(s) intramuscular every other week   benicar 5 mg oral tablet  1 tab(s) orally once a day   tamsulosin 0.4 mg oral capsule  1 cap(s) orally once a day   simvastatin 20 mg oral tablet  1 tab(s) orally once a day (at bedtime)   citalopram 40 mg oral tablet  1.5 tab(s) orally once a day (at bedtime)   metoprolol tartrate 25 mg oral tablet  1 tab(s) orally 2 times a day   tiotropium 18 mcg inhalation capsule  1 cap(s) inhaled once a day   aspir 81 81 mg oral delayed release tablet  1 tab(s) orally once a day   eliquis 5 mg oral tablet  2 tab(s) orally 2 times a day then 1 tab PO bid       DISCHARGE DIET: Low sodium.   DISCHARGE ACTIVITY: As tolerated.   DISCHARGE INSTRUCTIONS AND FOLLOWUP: The patient was instructed to follow up with his primary care physician, Dr. Elsie Stain, in 1 to 2 weeks and then followup with Dr. Barbette Reichmann from oncology in 2 to 4 weeks. He was provided 2 liters oxygen via nasal cannula continuous for now. May need to be used on ambulation if needed. The patient was also set up to get home health services along with nurses. He was instructed to get CBC and basic metabolic panel checked in 1 week with results forwarded to his primary care physician for kidney function monitoring and blood counts.    TOTAL TIME DISCHARGING THIS PATIENT: 55 minutes.     ____________________________ Lucina Mellow. Manuella Ghazi,  MD vss:TT D: 11/10/2014 16:06:56 ET T: 11/10/2014 16:55:04 ET JOB#: [id number]  cc: Valdemar Mcclenahan S. Manuella Ghazi, MD, <Dictator> Elveria Rising. Damita Dunnings, MD Simonne Come Inez Pilgrim, MD Algernon Huxley, MD Allyne Gee, MD Deboraha Sprang, MD Lucina Mellow [facility] MD ELECTRONICALLY SIGNED 11/12/2014 9:19

## 2015-03-16 NOTE — Consult Note (Signed)
Chief Complaint:  Subjective/Chief Complaint RUQ LOWER CHEST PAIN CONTROLLED ON PRN MEDS   VITAL SIGNS/ANCILLARY NOTES: **Vital Signs.:   15-Dec-15 12:31  Vital Signs Type Routine  Temperature Temperature (F) 97.5  Celsius 36.3  Temperature Source oral  Pulse Pulse 69  Respirations Respirations 20  Systolic BP Systolic BP 747  Diastolic BP (mmHg) Diastolic BP (mmHg) 74  Mean BP 92  Pulse Ox % Pulse Ox % 98  Pulse Ox Activity Level  At rest  Oxygen Delivery 3L   Brief Assessment:  Respiratory normal resp effort   Gastrointestinal details normal Soft  Nontender   Additional Physical Exam NEURO GROSSLY NON FOCAL NO EDEMA   Lab Results: Hepatic:  15-Dec-15 04:14   Bilirubin, Total 0.7  Bilirubin, Direct 0.1 (Result(s) reported on 06 Nov 2014 at 10:16AM.)  Alkaline Phosphatase 46 (46-116 NOTE: New Reference Range 06/12/14)  SGPT (ALT) 25 (14-63 NOTE: New Reference Range 06/12/14)  SGOT (AST) 18  Total Protein, Serum 7.6  Albumin, Serum  3.2  Routine Hem:  15-Dec-15 04:14   WBC (CBC)  12.4  RBC (CBC) 4.64  Hemoglobin (CBC) 14.7  Hematocrit (CBC) 45.4  Platelet Count (CBC)  140  MCV 98  MCH 31.7  MCHC 32.4  RDW  15.0  Neutrophil % 79.7  Lymphocyte % 8.0  Monocyte % 11.4  Eosinophil % 0.6  Basophil % 0.3  Neutrophil #  9.9  Lymphocyte # 1.0  Monocyte #  1.4  Eosinophil # 0.1  Basophil # 0.0 (Result(s) reported on 06 Nov 2014 at 04:59AM.)   Assessment/Plan:  Assessment/Plan:  Assessment SEE ALSO INITIAL CONSULT.  DVT AND PE. S/P IVC FILTER. LEUKOCYTOSIS LOOKS REACTIVE. MILD THROMBOCYTOPENIAS LIKELY CONSUMPTION. BASELINE PLTS UNKNOWN. POSSIBLE HYPERSPLENISM, HAS FATTY LIVER AND SPLEN NOT COMPLETELY IMAGED FOR SIZE. OTHER CAUSES POSSIBLE. PULMONARY INFARCT, CHEST PAIN.  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 STILL ON PAIN MEDS.  I ADVISE NO NSAIDS AND HAVE DISCONTINUED TORADOL. WOULD DEFER TO MEDICINE, DEPENDING ON  INITIAL INDICATION FOR ASA, WHETHER TO CONTINUE LATER, FOR NOW I HAVE SUSPENDED ASA. AS ALREADY NOTED, LATER EVALUATION WILL INCLUDE SERIAL CBC, ADDITIONAL W/U PRN IF PLTS REMAIN LOW, DO NOT NEED HYPERCOAG STUDIES . LATER PET CT AND INVASIVE W/U, BUT INITIALLY ONLY MANAGING CLOT AND CLOT COMPLICATIONS   Electronic Signatures: Dallas Schimke (MD)  (Signed 15-Dec-15 22:17)  Authored: Chief Complaint, VITAL SIGNS/ANCILLARY NOTES, Brief Assessment, Lab Results, Assessment/Plan   Last Updated: 15-Dec-15 22:17 by Dallas Schimke (MD)

## 2015-03-16 NOTE — Consult Note (Signed)
CHIEF COMPLAINT and HISTORY:  Subjective/Chief Complaint shrotness of breath   History of Present Illness Patient is admitted yesterday with several days of shortness of breath.  This started somewhat acutely, although his wife recalls him having a similar issue in September that really never got all the way better.  This episode was more severe.  Had some dull pain in his chest as well.  Has a known history of COPD.  No real recent causitive factors.  No travel, surgery, or trauma.  Has gotten a little better since admission.  The SOB prompted hospital evaluation and he was found to have submassive bilateral PE.  Duplex of LE shows LLE DVT residual.  He reports a history of some "blockage" in left leg years ago.  Not clear if this was venous or arterial.  He was a long distance cycler many years ago.  Also a question of a right upper lobe mass and possible malignancy.  This is a new finding.  He has been started on heparin, but given submassive nature of the PE and the residual LE DVT in a patient with known severe lund disease and poor pulmonary reserve, we are consulted for IVC filter placement.   PAST MEDICAL/SURGICAL HISTORY:  Past Medical History:   COPD:    Fractured left ankle:    Cancer, Throat:    NIDDM:    NIDDM:    Abdominal Surgery for gangrenous omentum:   ALLERGIES:  Allergies:  No Known Allergies:   HOME MEDICATIONS:  Home Medications: Medication Instructions Status  Aspir 81 81 mg oral tablet 1 tab(s) orally once a day Active  vitamin d 1000 international unit(s) orally once a day Active  [place] Thyroid 30 mg oral tablet 1 tab(s) orally once a day Active  testosterone 200 milligram(s) intramuscular every other week Active  Benicar 5 mg oral tablet 1 tab(s) orally once a day Active  losartan 25 mg oral tablet 1 tab(s) orally once a day Active  tamsulosin 0.4 mg oral capsule 1 cap(s) orally once a day Active  citalopram 40 mg oral tablet 1 tab(s) orally once a day  Active  simvastatin 20 mg oral tablet 1 tab(s) orally once a day (at bedtime) Active  allopurinol 100 mg oral tablet 1  orally 2 times a day  Active   Family and Social History:  Family History Non-Contributory   Social History positive tobacco (Greater than 1 year), positive ETOH, negative Illicit drugs   + Tobacco Prior (greater than 1 year)   Place of Living Home   Review of Systems:  Subjective/Chief Complaint No TIA/stroke/seizure No heat or cold intolerance No dysuria/hematuria No blurry or double vision No tinnitus or ear pain No rashes or ulcer   Fever/Chills Yes  low grade   Cough Yes   Sputum Yes   Abdominal Pain No   Diarrhea No   Constipation No   Nausea/Vomiting No   SOB/DOE Yes   Chest Pain Yes   Telemetry Reviewed NSR   Dysuria No   Tolerating PT Yes   Tolerating Diet Yes   Medications/Allergies Reviewed Medications/Allergies reviewed   Physical Exam:  GEN well developed, well nourished   HEENT pink conjunctivae, moist oral mucosa   NECK No masses  trachea midline   RESP postive use of accessory muscles  rhonchi   CARD regular rate  LE edema present  no JVD   VASCULAR ACCESS none   ABD denies tenderness  soft  normal BS   GU no superpubic  tenderness   LYMPH negative neck, negative axillae   EXTR negative cyanosis/clubbing, positive edema   SKIN normal to palpation, skin turgor good   NEURO cranial nerves intact, follows commands, motor/sensory function intact   PSYCH alert, A+O to time, place, person   LABS:  Laboratory Results: Thyroid:    14-Dec-15 05:54, Thyroid Stimulating Hormone  Thyroid Stimulating Hormone 0.679  0.45-4.50  (IU = International Unit)   -----------------------  Pregnant patients have   different reference   ranges for TSH:   - - - - - - - - - -   Pregnant, first trimetser:   0.36 - 2.50 uIU/mL  LabObservation:    13-Dec-15 07:14, Echo Doppler  OBSERVATION   Reason for Test     13-Dec-15 15:43, [place] Color Flow Doppler Low Extrem Bilat (Legs)  OBSERVATION   Reason for Test Shortness of Breath  Hepatic:    13-Dec-15 00:36, Comprehensive Metabolic Panel  Bilirubin, Total 0.5  Alkaline Phosphatase 63  46-116  NOTE: New Reference Range  06/12/14  SGPT (ALT) 25  14-63  NOTE: New Reference Range  06/12/14  SGOT (AST) 24  Total Protein, Serum 7.4  Albumin, Serum 3.1  Cardiology:    13-Dec-15 07:14, Echo Doppler  Echo Doppler   REASON FOR EXAM:      COMMENTS:       PROCEDURE: [place] - ECHO DOPPLER COMPLETE(TRANSTHOR)  - Nov 04 2014  7:14AM     RESULT: Echocardiogram Report    Patient Name:   Christopher Santiago Date of Exam: 11/04/2014  Medical Rec #:  [id number]         Custom1:  Dateof Birth:  August 31, 1931      Height:       69.0 in  Patient Age:    81 years       Weight:       210.0 lb  Patient Gender: M              BSA:          2.11 m??    Indications: Unstable Angina  Sonographer:    Arville Go RDCS  Referring Phys: [facility], N    Sonographer Comments: Suboptimal parasternal window.    Summary:   1. Left ventricular ejection fraction, by visual estimation, is 55 to   60%.   2. Normal global left ventricular systolic function.   3. Impaired relaxation pattern of LV diastolic filling.   4. Mild concentric left ventricular hypertrophy.   5. Mild aortic valve sclerosis without stenosis.   6. Mildly elevated pulmonary artery systolic pressure.   7. Mild tricuspid regurgitation.  LV DIASTOLIC FUNCTION:  MV Peak E:0.61 m/s E/e' Ratio: 9.80  MV Peak A: 1.34 m/s Decel Time: 153 msec  E/A Ratio: 0.45  SPECTRAL DOPPLER ANALYSIS (where applicable):  Mitral Valve:  MV P1/2 Time: 44.37 msec  MV Area, PHT: 4.96 cm??  Aortic Valve: AoV Max Vel: 1.38 m/s AoV Peak PG: 7.85mHg AoV Mean PG:  LVOT Vmax: 1.27 m/s LVOT VTI:  LVOT Diameter:  Tricuspid Valve and PA/RV Systolic Pressure: TR Max Velocity: 2.89 m/s RA   Pressure: 10 mmHg RVSP/PASP: 43.4  mmHg  Pulmonic Valve:  PV Max Velocity: 1.10 m/s PV Max PG: 4.8 mmHg PV Mean PG:    PHYSICIAN INTERPRETATION:  Left Ventricle: Mild concentric left ventricular hypertrophy. Global LV     systolic function was normal. Left ventricular ejection fraction, by   visual estimation, is 55 to 60%. Spectral Doppler shows  impaired   relaxation pattern of LV diastolic filling.  Right Ventricle: Normal right ventricular size, wall thickness, and   systolic function.  Left Atrium: The left atrium is normal in size and structure.  Right Atrium: The right atrium is normal in size and structure.  Pericardium: There is no evidence of pericardial effusion.  Mitral Valve: No evidence of mitral valve stenosis. Trace mitral valve   regurgitation is seen.  Tricuspid Valve: The tricuspid valve is normal. Mild tricuspid   regurgitation is visualized. The tricuspid regurgitant velocity is 2.89   m/s, and with an assumed right atrial pressure of 10 mmHg, the estimated   right ventricular systolic pressure is mildly elevated at 43.4 mmHg.  Aortic Valve: Mild aortic valve sclerosis is present, with no evidence of     aortic valve stenosis. No evidence of aortic valve regurgitation is seen.  Pulmonic Valve: The pulmonic valve is not well seen.  Aorta: The aorta is not well seen.    [id number] Kathlyn Sacramento MD  Electronically signed by Harrietta Guardian MD  Signature Date/Time: 11/04/2014/9:58:16 AM    *** Final ***    IMPRESSION: .        Verified By: Mertie Clause. ARIDA, M.D., MD  Routine Chem:    13-Dec-15 00:36, Comprehensive Metabolic Panel  Glucose, Serum 175  BUN 14  Creatinine (comp) 1.09  Sodium, Serum 137  Potassium, Serum 4.0  Chloride, Serum 105  CO2, Serum 26  Calcium (Total), Serum 8.8  Osmolality (calc) 279  eGFR (African American) >60  eGFR (Non-African American) >60  eGFR values <53m/min/1.73 m2 may be an indication of chronic  kidney disease (CKD).  Calculated eGFR, using the  MRDR Study equation, is useful in   patients with stable renal function.  The eGFR calculation will not be reliable in acutely ill patients  when serum creatinine is changing rapidly. It is not useful in  patients on dialysis. The eGFR calculation may not be applicable  to patients at the low and high extremes of body sizes, pregnant  women, and vegetarians.  Anion Gap 6    13-Dec-15 00:36, Troponin I  Result Comment   TROPONIN - RESULTS VERIFIED BY REPEAT TESTING.   - C/CHAD FOGLEMAN/0138/11-04-14/[facility]   - READ-BACK PROCESS PERFORMED.   Result(s) reported on 04 Nov 2014 at 01:45AM.    13-Dec-15 04:47, Troponin I  Result Comment   TROPONIN - RESULTS VERIFIED BY REPEAT TESTING.   - HIGH PREVIOUSLY CALLED BY RW AT 0138 ON   - 11-04-14/[facility]   Result(s) reported on 04 Nov 2014 at [facility]    13-Dec-15 08:11, Troponin I  Result Comment   Troponin - RESULTS VERIFIED BY REPEAT TESTING.   - Elevated troponin previously called @   - 0138 11/04/14 by [facility] - [facility]   Result(s) reported on 04 Nov 2014 at 08:55AM.    14-Dec-15 05:54, Hemoglobin A1c ([facility])  Hemoglobin A1c ([facility] 6.3  The American Diabetes Association recommends that a primary goal of  therapy should be <7% and that physicians should reevaluate the  treatment regimen in patients with HbA1c values consistently >8%.  Cardiac:    13-Dec-15 00:36, Troponin I  Troponin I 0.13  0.00-0.05  0.05 ng/mL or less: NEGATIVE   Repeat testing in 3-6 hrs   if clinically indicated.  >0.05 ng/mL: POTENTIAL   MYOCARDIAL INJURY. Repeat   testing in 3-6 hrs if   clinically indicated.  NOTE: An increase or decrease   of 30% or more on serial  testing suggests a   clinically important change    13-Dec-15 04:47, Troponin I  Troponin I 0.09  0.00-0.05  0.05 ng/mL or less: NEGATIVE   Repeat testing in 3-6 hrs   if clinically indicated.  >0.05 ng/mL: POTENTIAL   MYOCARDIAL INJURY. Repeat   testing in 3-6 hrs if   clinically  indicated.  NOTE: An increase or decrease   of 30% or more on serial   testing suggests a   clinically important change    13-Dec-15 08:11, Troponin I  Troponin I 0.07  0.00-0.05  0.05 ng/mL or less: NEGATIVE   Repeat testing in 3-6 hrs   if clinically indicated.  >0.05 ng/mL: POTENTIAL   MYOCARDIAL INJURY. Repeat   testing in 3-6 hrs if   clinically indicated.  NOTE: An increase or decrease   of 30% or more on serial   testing suggests a   clinically important change  Routine Coag:    13-Dec-15 00:36, Activated PTT  Activated PTT (APTT) 27.3  A HCT value >55% may artifactually increase the APTT. In one study,  the increase was an average of 19%.  Reference: "Effect on Routine and Special Coagulation Testing Values  of Citrate Anticoagulant Adjustment in Patients with High HCT Values."  American Journal of Clinical Pathology 2006;126:400-405.    13-Dec-15 00:36, Prothrombin Time  Prothrombin 13.6  INR 1.1  INR reference interval applies to patients on anticoagulant therapy.  A single INR therapeutic range for coumarins is not optimal for all  indications; however, the suggested range for most indications is  2.0 - 3.0.  Exceptions to the INR Reference Range may include: Prosthetic heart  valves, acute myocardial infarction, prevention of myocardial  infarction, and combinations of aspirin and anticoagulant. The need  for a higher or lower target INR must be assessed individually.  Reference: The Pharmacology and Management of the Vitamin K   antagonists: the seventh ACCP Conference on Antithrombotic and  Thrombolytic Therapy. VHQIO.9629 Sept:126 (3suppl): [id number].  A HCT value >55% may artifactually increase the PT.  In one study,   the increase was an average of 25%.  Reference:  "Effect on Routine and Special Coagulation Testing Values  of Citrate Anticoagulant Adjustment in Patients with High HCT Values."  American Journal of Clinical Pathology 2006;126:400-405.     13-Dec-15 11:25, D-Dimer, Quantitative  D-Dimer, Quantitative 4763  INTERPRETATION  <> Exclusion of Venous Thromboembolism (VTE) - OUTPATIENT ONLY        (Emergency Department or [place])              0-499 ng/ml (FEU)  : With a low to intermediate pretest                                   probability for VTE this test result                                   excludes the diagnosis of VTE.              > 499 ng/ml (FEU)  : VTE not excluded; additional work up                                   for VTE is required.  <> Testing on Inpatients and  Evaluation of Disseminated Intravascular         Coagulation (DIC)              Reference Range:  0-499 ng/ml (FEU)  Routine Hem:    13-Dec-15 00:36, Hemogram, Platelet Count  WBC (CBC) 11.3  RBC (CBC) 4.65  Hemoglobin (CBC) 14.8  Hematocrit (CBC) 45.2  Platelet Count (CBC) 119  Result(s) reported on 04 Nov 2014 at 01:14AM.  MCV 97  MCH 31.9  MCHC 32.8  RDW 15.1    14-Dec-15 05:54, CBC Profile  WBC (CBC) 16.5  RBC (CBC) 4.52  Hemoglobin (CBC) 14.2  Hematocrit (CBC) 44.1  Platelet Count (CBC) 121  MCV 98  MCH 31.5  MCHC 32.3  RDW 15.1  Neutrophil % 82.3  Lymphocyte % 8.1  Monocyte % 9.3  Eosinophil % 0.1  Basophil % 0.2  Neutrophil # 13.6  Lymphocyte # 1.3  Monocyte # 1.5  Eosinophil # 0.0  Basophil # 0.0  Result(s) reported on 05 Nov 2014 at 06:45AM.   RADIOLOGY:  Radiology Results: XRay:    13-Dec-15 01:37, Chest Portable Single View  Chest Portable Single View  REASON FOR EXAM:    SOB  COMMENTS:       PROCEDURE: DXR - DXR PORTABLE CHEST SINGLE VIEW  - Nov 04 2014  1:37AM     CLINICAL DATA:  Right lower rib and chest pain for 3 days. Shortness  of breath.    EXAM:  PORTABLE CHEST - 1 VIEW    COMPARISON:  None.    FINDINGS:  Emphysematous changes in the lungs. Calcified granulomas in the  upper lungs. Normal heart size and pulmonary vascularity. No focal  airspace disease or consolidation in the lungs. No  blunting of  costophrenic angles. No pneumothorax.Mediastinal contours appear  intact. Calcified aorta. Slight fibrosis in the left lung base.     IMPRESSION:  Emphysematous changes in the lungs. No evidence of active pulmonary  disease.      Electronically Signed    By: Lucienne Capers M.D.    On:11/04/2014 01:39       Verified By: Neale Burly, M.D.,  [place]:    13-Dec-15 15:43, [place] Color Flow Doppler Low Extrem Bilat (Legs)  [place] Color Flow Doppler Low Extrem Bilat (Legs)  REASON FOR EXAM:    Shortness of Breath  COMMENTS:       PROCEDURE: [place]  - US DOPPLER LOW EXTR BILATERAL  - Nov 04 2014  3:43PM     CLINICAL DATA:  Acute large burden bilateral pulmonary emboli  identified on CTA chest earlier today after the patient presented to  the emergency department with 4 day history of shortness of breath.  Evaluate for possible thromboembolic source.    EXAM:  BILATERAL LOWER EXTREMITY VENOUS DOPPLER ULTRASOUND    TECHNIQUE:  Gray-scale sonography with graded compression,as well as color  Doppler and duplex ultrasound were performed to evaluate the lower  extremity deep venous systems from the level of the common femoral  vein and including the common femoral, femoral, profunda femoral,  popliteal and calf veins including the posterior tibial, peroneal  and gastrocnemius veins when visible. The superficial great  saphenous vein was also interrogated. Spectral Doppler was utilized  to evaluate flow at rest and with distal augmentation maneuvers in  the common femoral, femoral and popliteal veins.    COMPARISON:  None.    FINDINGS:  RIGHT LOWER EXTREMITY    Common Femoral Vein: No evidence of thrombus. Normal  compressibility, respiratory phasicity and response to augmentation.    Saphenofemoral Junction: No evidence of thrombus. Normal  compressibility and flow on color Doppler imaging.    Profunda Femoral Vein: No evidence of thrombus. Normal  compressibility and  flow on color Doppler imaging.    Femoral Vein: No evidence of thrombus. Normal compressibility,  respiratory phasicity and response to augmentation.    Popliteal Vein: No evidence of thrombus. Normal compressibility,  respiratory phasicity and response to augmentation.    Calf Veins: No evidence of thrombus. Normal compressibility and flow  on color Doppler imaging.    Superficial Great Saphenous Vein: Not evaluated other than centrally  at the saphenofemoral junction.    Venous Reflux:  Not evaluated.    Other Findings:  None.    LEFT LOWER EXTREMITY    Common Femoral Vein: No evidence of thrombus. Normal  compressibility, respiratory phasicity and response to augmentation.    Saphenofemoral Junction: No evidence of thrombus. Normal  compressibility and flow on color Doppler imaging.    Profunda Femoral Vein: Nearly occlusive thrombus involving the  central portion of the vein, with only minimal color Doppler flow  visible.    Femoral Vein: Nearly occlusive thrombus involving the central  portion of the vein, with only minimal color Doppler flow visible.    Popliteal Vein: No evidence of thrombus. Normal compressibility,  respiratory phasicity.    Calf Veins: No evidence of thrombus. Normal compressibility and flow  on color Doppler imaging.  Superficial Great Saphenous Vein: Not evaluated other than centrally  at the saphenofemoral junction.    Venous Reflux:  Not evaluated.    Other Findings:  None.     IMPRESSION:  1. Nearly occlusive thrombus involving the central portion of the  left femoral vein and profunda femoral vein.  2. No evidence of right lower extremity DVT.      Electronically Signed    By: Evangeline Dakin M.D.    On: 11/04/2014 15:54         Verified By: Deniece Portela, M.D.,  [place]:    13-Dec-15 01:37, Chest Portable Single View  PACS Image    13-Dec-15 15:16, CT ANGIOGRAPHY Chest with for PE  PACS Image    13-Dec-15 15:43,  [place] Color Flow Doppler Low Extrem Bilat (Legs)  PACS Image   ASSESSMENT AND PLAN:  Assessment/Admission Diagnosis submassive PE and residual DVT in a patient with COPD and possible new malignancy. Has started on anticoagulation and tolerated this OK Other medical issues as above   Plan Given the above findings, IVC filter is reasonable. Discussed with patient and wife and they desire this be placed. Risks and benefits discussed and will place later today    level 4 consult   Electronic Signatures: Algernon Huxley (MD)  (Signed 14-Dec-15 12:23)  Authored: Chief Complaint and History, PAST MEDICAL/SURGICAL HISTORY, ALLERGIES, HOME MEDICATIONS, Family and Social History, Review of Systems, Physical Exam, LABS, RADIOLOGY, Assessment and Plan   Last Updated: 14-Dec-15 12:23 by Algernon Huxley (MD)

## 2015-03-16 NOTE — Consult Note (Signed)
Brief Consult Note: Diagnosis: RUL MASS,  MILD THROMBOCYTOPENIA, DVT,  PE.   Patient was seen by consultant.   Comments: PATIENT SEEN CHART REVIEWED  NO ACUTE COMPLAINTS NO CP. NOW NOT SOB AT REST ON 02. HAS HX COPD, CAD, QUIT SMOKING 15 YRS AGO. IS AWARE OF LONGSTANDING HX OF CALCIFICATION IN RIGHT LUNG, SEEN ON CT, BUT THIS IS SEPASRATE FROM SPICULATED MASS, LOOKS LIKE T1 N1 SO FAR M0 DISEASE WITH RIGHT HILAR NODE. NO BONE PAIN. BENIGN APPEARING ADRENAL. PE, DVT, AND FILTER LACEMENT AS REVIEWED BY VASCULAR SURGERY. MILD LEUKOCYTOSIS CONSISTENT WITH REACTIVE. MILD THROMBOCYTOPENIA. COULD BE CONSUMPTIVE WITH LARGE CLOT BURDEN. BASELINE CBC UNKNOWN....Marland KitchenPLAN, ALREADY ANTICOAGULATED, WITH INFARCT, AND RISK OF BLEED, AND FILTER PLACED, WOULD CONTINUE DRIP, WATCH FOR HEMOPTYSIS, WHEN STABLE THEN CHANGE TO ORAL TX SUCH AS ELAQUIS. WHEN STABLE, PET/CT SCAN FOR STAGING. LATER, WHEN STABLE, WITH NEAR BASELINE AS POSSIBLE 02 SATS AND RESP STATUS, ONLY THEN STOP ANTICOAGULATION AND INVASIVE PROCEEDURE. NO NEED FOR HYPERCOAGUABLE W/U. WATCH SERIAL CBC. CHECK BASELINE CBC. IF PLTS DO NOT NORMALIZE MAY NEED ADDITIONAL W/U.  Electronic Signatures: Dallas Schimke (MD)  (Signed 14-Dec-15 23:54)  Authored: Brief Consult Note   Last Updated: 14-Dec-15 23:54 by Dallas Schimke (MD)

## 2015-03-16 NOTE — H&P (Signed)
PATIENT NAME:  Christopher Santiago, Christopher Santiago MR#:  [id number] DATE OF BIRTH:  1930/11/28  DATE OF ADMISSION:  11/04/2014  REFERRING PHYSICIAN: Gretchen Short. Beather Arbour, MD   PRIMARY CARE PHYSICIAN: Elveria Rising. Damita Dunnings, MD  ADMITTING PHYSICIAN: Juluis Mire, MD   CHIEF COMPLAINT:  1.  Exertional shortness of breath for the past 3 to 4 days.  2.  Nonproductive cough.  3.  Temperature of 99.5 degrees Fahrenheit as noted by EMS.   HISTORY OF PRESENT ILLNESS: An 81 year old Caucasian male with a past medical history of COPD, hypertension, hyperlipidemia, diabetes mellitus type 2, diet controlled, history of gout, hypothyroidism, depression, sleep apnea, on CPAP, presents to the Emergency Room with  complaints of ongoing exertional shortness of breath for the past 3 to 4 days. Gradually, the  exertional shortness of breath has increased today, hence, she came to the Emergency Room for further evaluation. The patient also gives a history of a nonproductive cough and is unable to get any sputum. The patient was noted to have a temperature of 99.5 degrees by EMS, for which he took ibuprofen at home. Denies any chest pain. No dizziness or loss of consciousness. No nausea. No vomiting. No diarrhea. No urinary symptoms. In the Emergency Room on arrival the patient was noted to have a mild hypoxia with O2 saturation of 95% on 2 liters oxygen supplementation. The patient was evaluated by the ED physician and was found to have COPD exacerbation and was continued on oxygen supplementation, albuterol nebulizer, and IV Solu-Medrol. Further workup: Lab workup came with elevated troponin with a positive of value of 0.13, and the rest of his labs were unremarkable. EKG: Sinus tachycardia with a rate of 104 and right bundle branch block, and left anterior fascicular block. After receiving oxygen and nebulizers, the patient started feeling better. Hospitalist service was consulted for further evaluation and management. The patient is comfortably  resting in the bed at this time. States that his shortness of breath is better. He does have a dry cough. Denies any chest pain. No dizziness. No loss of consciousness.    PAST MEDICAL HISTORY:  1.  COPD.  2.  Hypertension.  3.  Hyperlipidemia.  4.  Diabetes mellitus, diet controlled (he used oral medications in the past and has been off oral medications for the past 2 years and currently controlled with diet and exercise).  5.  Gout.   6.  Hypothyroidism.  7.  Depression.  8.  Sleep apnea, on CPAP.    PAST SURGICAL HISTORY:  1.  Abdominal surgery for omental resection.  2.  Right inguinal hernia repair.  3.  Left foot surgery for some fracture.   ALLERGIES: No known drug allergies.   HOME MEDICATIONS: Allopurinol 100 mg tablet 1 tablet orally twice a day, Armour Thyroid 60 mg tablet 1 tablet orally once a day, aspirin 81 mg 1 tablet orally once a day, Benicar 20 mg tablet 1 tablet orally once a day, Celexa 40 mg tablet 1 tablet orally once a day, cholecalciferol 1000 units 1 tablet orally twice a day, indomethacin 50 mg as needed, hydrochlorothiazide 25 mg tablet 1 tablet orally once a day, Norco as needed, Pravachol 10 mg 1 tablet orally once a day, Proventil as needed, Rhinocort nasal spray as needed, and Spiriva 18 mcg inhalation capsule 1 inhalation once a day.   FAMILY HISTORY: Nonsignificant.   SOCIAL HISTORY: He is married. He lives with his wife. He is a retired [employer] account planner. History of smoking in the  past, quit about 15 years ago. He does drink alcohol (beer), 3 beers a day. Denies any history of substance abuse.    REVIEW OF SYSTEMS:  CONSTITUTIONAL: Denies any fever, but EMS noted a temperature of 99.5 degrees on arrival at his home. Dyspnea on exertion present, but no fatigue. No generalized weakness.  EYES: Negative for blurred vision, double vision. No pain. No redness. No inflammation.  EARS, NOSE, AND THROAT: Negative for tinnitus, ear pain, hearing loss, epistaxis,  nasal discharge, difficulty swallowing.  RESPIRATORY: Nonproductive cough present. Dyspnea on exertion with wheezing present. No hemoptysis. No painful respirations.  CARDIOVASCULAR: No chest pain. No orthopnea, but he does have dyspnea on exertion. No pedal edema. No palpitations. No dizziness or syncope.  GASTROINTESTINAL: No nausea, vomiting, diarrhea, abdominal pain, hematemesis, melena, GERD symptoms, or rectal bleeding.  GENITOURINARY: Negative for dysuria, hematuria, frequency, or urgency.  ENDOCRINE: Negative for polydipsia or nocturia. No heat or cold intolerance. History of hypothyroidism, takes Armour Thyroid.  HEMATOLOGIC: Negative for anemia, easy bruising, or bleeding.  INTEGUMENTARY: Negative for acne, skin rash, or lesions.  MUSCULOSKELETAL: History of gout for which he takes allopurinol and p.r.n. pain medications, and currently denies any arthritic pains.  NEUROLOGICAL: Negative for focal weakness or numbness. No history of CVA, TIA, or seizure disorder.   PSYCHIATRIC: History of depression for which he takes Celexa and is under control.   PHYSICAL EXAMINATION:  VITAL SIGNS: Temperature 98.7 degrees Fahrenheit, pulse rate 108 per minute, respirations 18 per minute, blood pressure 147/84, O2 saturation is 95% on 2 liters of oxygen.  GENERAL: Elderly male, obese, alert, awake, and oriented, not in any acute distress, comfortably lying, pleasant and cooperative. Well developed, well nourished.  HEAD: Atraumatic, normocephalic.  EYES: Pupils are equal, reactive to light and accommodation. No conjunctival pallor. No scleral icterus. Extraocular movements intact.  NOSE: No nasal lesions. No drainage.  EARS: No drainage. No external lesions.  ORAL CAVITY: No mucosal lesions. No exudates.  NECK: Supple. No JVD. No thyromegaly. No carotid bruit. Range of motion of neck normal.  RESPIRATORY: Good respiratory effort. Not using accessory muscles of respiration. Bilateral air entry present,  bilateral few rhonchi present. No rales.   CARDIOVASCULAR: S1, S2 regular. No murmurs, gallops, or clicks appreciated. Peripheral pulses equal at carotid, femoral, and pedal pulses. No peripheral edema.  GASTROINTESTINAL: Abdomen is soft, obese, nontender. No hepatosplenomegaly. Bowel sounds are equal and present in all 4 quadrants. No rebound. No guarding. No rigidity.  GENITOURINARY: Deferred.  MUSCULOSKELETAL: Gait not tested. No tenderness or effusion of the joints. Range of motion of all joints normal. Strength and tone are equal bilaterally.  SKIN: Inspection within normal limits.  LYMPHATIC: Negative for cervical lymphadenopathy.  VASCULAR: Good dorsalis pedis and posterior tibial pulses.  NEUROLOGICAL: Alert, awake, and oriented x 3. Cranial nerves II to XII grossly intact. DTRs 2+, symmetrical bilateral. Motor strength is 5/5 in both upper and lower extremities.  PSYCHIATRIC: Judgment and insight are adequate. Alert and oriented x 3. Memory and mood are within normal limits.   LABORATORY DATA: Serum glucose 135, BUN 14, creatinine 1.09, serum sodium 137, potassium 4.0, chloride 105, bicarbonate 26, total calcium 8.8, total protein 7.4, albumin 3.1, total bilirubin 0.5, alkaline phosphatase 63, AST 24, ALT 25. Troponin 0.13 WBC 11.3, hemoglobin 14.8, hematocrit 45.2, platelet count 119,000. Prothrombin time 13.6, INR 1.1.   IMAGING STUDIES:  Chest x-ray: Emphysematous changes in the lungs. No evidence of active pulmonary disease.   EKG: Sinus tachycardia with ventricular  rate of 104 beats minutes. Right bundle branch block, left anterior fascicular block present.   ASSESSMENT AND PLAN: This is an 81 year old Caucasian male with past medical history of chronic obstructive pulmonary disease, hypertension, hyperlipidemia, diet-controlled diabetes mellitus, obstructive sleep apnea, gout, and depression, who presents to the Emergency Room with complaints of exertional shortness of breath ongoing  for the past 3 to 4 days associated with a nonproductive cough, found to be mildly hypoxic on arrival with wheezing, and further workup revealed elevated troponin.  1.  Chronic obstructive pulmonary disease exacerbation, possibly secondary to acute bronchitis.  Plan: Admit to medical floor. Intravenous Solu-Medrol, vigorous DuoNebs. Levaquin. Continue oxygen supplementation and continue Spiriva.   2.  Elevated troponin, acute coronary syndrome. Plan: Telemetry monitoring, aspirin, nitroglycerin as needed, start beta blocker. Continue statin. Start heparin drip. Cycle cardiac enzymes. Echocardiogram ordered and cardiology consult for further evaluation.  3.  Hypertension, controlled on home medications. Continue same.  4.  Diabetes mellitus, diet controlled. The patient used oral medications in the past and states he has been off oral medications for the past 2 years and is well controlled with diet and exercise. Continue diabetic diet. Sliding scale insulin. Monitor blood sugars. Check hemoglobin A1c.  5.  Hyperlipidemia, on statin, stable. Continue same.  6.  History of gout, stable on allopurinol. Continue same.  7.  Obstructive sleep apnea, on continuous positive airway pressure. Continue same.  8.  History of depression, stable on Celexa. Continue same.  9.  Hypothyroidism, stable on home medication. Continue same. Check TSH.  10.  Deep vein thrombosis prophylaxis. Heparin drip.  11.  Gastrointestinal prophylaxis. Protonix.   CODE STATUS: FULL CODE.   TIME SPENT: 55 minutes.    ____________________________ Juluis Mire, MD enr:ts D: 11/04/2014 03:03:48 ET T: 11/04/2014 03:22:58 ET JOB#: [id number]  cc: Juluis Mire, MD, <Dictator> Elveria Rising. Damita Dunnings, MD Juluis Mire MD ELECTRONICALLY SIGNED 11/04/2014 18:58

## 2015-03-16 NOTE — Consult Note (Signed)
Brief Consult Note: Diagnosis: + troponin with known CAD.   Patient was seen by consultant.   Consult note dictated.   Recommend further assessment or treatment.   Orders entered.   Comments: the abrupt onset of this dyspnea unlike anything he ahs ever had is concerning and prompts questions as to mechanism  certainly cCOPD exacderbation is at top of list with low grade fever but CXR unimpressive.  TN elevation can be secondary but the decreasing trend at least prompts question of ischemic trigger esp given kknown CAD.  also the abruptness raises concern for PE although nothing else speaks to that except the sinus tach  for now agree with plan will cheik ddimer.  Electronic Signatures: Deboraha Sprang (MD)  (Signed 13-Dec-15 12:58)  Authored: Brief Consult Note   Last Updated: 13-Dec-15 12:58 by Deboraha Sprang (MD)

## 2015-03-18 DIAGNOSIS — M9903 Segmental and somatic dysfunction of lumbar region: Secondary | ICD-10-CM | POA: Diagnosis not present

## 2015-03-18 DIAGNOSIS — M62830 Muscle spasm of back: Secondary | ICD-10-CM | POA: Diagnosis not present

## 2015-03-18 DIAGNOSIS — M9901 Segmental and somatic dysfunction of cervical region: Secondary | ICD-10-CM | POA: Diagnosis not present

## 2015-03-18 DIAGNOSIS — M5136 Other intervertebral disc degeneration, lumbar region: Secondary | ICD-10-CM | POA: Diagnosis not present

## 2015-03-18 LAB — SURGICAL PATHOLOGY

## 2015-03-18 LAB — CYTOLOGY - NON PAP

## 2015-03-20 NOTE — Op Note (Signed)
PATIENT NAME:  DYON, ROTERT MR#:  [id number] DATE OF BIRTH:  06/28/1931  DATE OF PROCEDURE:  11/05/2014  PREOPERATIVE DIAGNOSES:   1.  Submassive pulmonary embolus. 2.  Residual left lower extremity deep venous thrombosis. 3.  Chronic obstructive pulmonary disease and possible lung mass with poor pulmonary reserve.  POSTOPERATIVE DIAGNOSES: 1.  Submassive pulmonary embolus. 2.  Residual left lower extremity deep venous thrombosis. 3.  Chronic obstructive pulmonary disease and possible lung mass with poor pulmonary reserve.  PROCEDURES PERFORMED: 1.  Ultrasound guidance for vascular access to right femoral vein. 2.  Catheter placement into inferior vena cava.  3.  Inferior venacavogram.  4.  Placement of a Bard Denali IVC filter.   SURGEON: Algernon Huxley, MD  ANESTHESIA:  Local with Versed.  ESTIMATED BLOOD LOSS: Minimal.   FLUOROSCOPY TIME:  Less than 1 minute.   CONTRAST USED:  15 mL Visipaque.  INDICATION FOR PROCEDURE:   This is an 81 year old gentleman with COPD and recent admission for submassive PE. He still has significant residual left lower extremity DVT with that in place, and with his poor pulmonary reserve and already submassive PE, it is felt that any further embolization would be fatal and we are asked to place an IVC filter.  Risks and benefits were discussed. Informed consent was obtained.   DESCRIPTION OF PROCEDURE:  The patient was brought to the vascular suite. The skin is sterilely prepped and draped and a sterile surgical field was created. The right femoral vein was accessed under direct ultrasound guidance without difficulty with a Seldinger needle and a J-wire was then placed. After skin nick and dilatation, the delivery sheath was placed into the inferior vena cava and an inferior venacavogram was performed. This demonstrated a patent IVC with the level of the renal veins at bottom of L1. The filter was then deployed into the inferior vena cava at the level of  L2.  The delivery sheath was then removed. Pressure was held. Sterile dressing was placed. The patient tolerated the procedure well and was taken to the recovery room in stable condition.    ____________________________ Algernon Huxley, MD jsd:sb D: 11/05/2014 13:12:44 ET T: 11/05/2014 15:18:56 ET JOB#: [id number]  cc: Algernon Huxley, MD, <Dictator> Algernon Huxley MD ELECTRONICALLY SIGNED 11/26/2014 14:11

## 2015-03-20 NOTE — Consult Note (Signed)
PATIENT NAME:  Christopher Santiago, Christopher Santiago MR#:  [id number] DATE OF BIRTH:  1931-05-27  DATE OF CONSULTATION:  11/05/2014  CONSULTING PHYSICIAN:  Simonne Come. Inez Pilgrim, MD  HISTORY OF PRESENT ILLNESS: Mr. Landstrom is an 81 year old patient who was admitted on December 13 and hematology consultation, evaluated on December 14. The patient was evaluated, notation placed on the chart, and discussed with medicine. This full narrative is delayed until the current time. The patient who was admitted on December 13, with a history of exertional shortness of breath for 3 to 4 days plus a cough, was noted to also have fever and later had scant hemoptysis and also some pleuritic chest pain. He had a sinus tachycardia, elevated troponin and mild hypoxia. He had a CT scan of the chest, and was documented with pulmonary embolism. It was initially thought he might just have COPD exacerbation. He was also seen by cardiology with elevated troponin. The CT scan showed large bilateral pulmonary embolism and the patient had an IVC filter placed. He had a normal baseline PT and PTT and a negative clotting history. He undergone an echocardiogram that showed impaired relaxation pattern of left ventricular diastolic filling and mild elevated pulmonary arterial system pressures. His D-dimer was   elevated over 4000. Doppler ultrasound of the lower extremities showed nearly occlusive thrombus involving the central portion of the profunda femoral vein on the left and nearly occlusive thrombus of the femoral vein as well. The patient was anticoagulated with heparin. Pulmonary was consulted as well as vascular surgery.   He was treated with heparin drip and also supportive care and p.r.n. inhalers for COPD. The CT scan has revealed 2 abnormalities in the lung, a calcified lesion and another right apical lesion with central necrosis suspicious for a primary pulmonary malignancy.   PAST HISTORY: As also evaluated by and summarized by admission history and  physical, reveals COPD, hypertension, hyperlipidemia, diabetes, gout, hypothyroid, depression, sleep apnea, on CPAP.   PAST SURGERY: Includes abdominal surgery for omental resection, right inguinal hernia repair and left foot surgery. The patient also knows that he has a chest x-ray goes back many years, has showed calcium lesion in the right lung.   MEDICATIONS AT HOME:  Allopurinol 100 mg twice a day, thyroid 60 mg daily, aspirin 81 mg daily, Benicar 20 mg daily, Celexa 40 mg daily, cholecalciferol 1000 units daily, Indocin 50 mg p.r.n., thiazide 25 mg daily, Norco p.r.n. Pravachol 10 mg daily, Proventil and Rhinocort p.r.n. and Spiriva daily.    FAMILY HISTORY: Negative for clot or malignancy.   SYSTEM REVIEW: When the patient was seen he was comfortable and he was less short of breath. He had a history of course of dyspnea on exertion. He had scant blood-tinged sputum on one occasion and he had some pleuritic chest pain. He had no retrosternal or central sternal pain. No nausea, vomiting, diarrhea. No dysuria or hematuria. No back or bone pain. No extremity edema. No rash or bruising. He has history of gout but no active inflamed or painful joints. Mood and affect was unremarkable. He has a history of depression.   PHYSICAL EXAMINATION:  GENERAL: Was alert and cooperative, looks subacutely ill.  HEENT: The sclerae were clear. The mouth has no thrush.  LYMPHATICS: There were no palpable lymph nodes in the neck, supraclavicular, submandibular, or axilla.  LUNGS: Adequate but decreased air entry. There were some scattered rhonchi.  HEART: Regular.  ABDOMEN: Nontender. No palpable mass or organomegaly.  EXTREMITIES: No extremity edema.  NEUROLOGIC: Grossly nonfocal. Mood and affect unremarkable.   LABORATORY DATA: On admission, his creatinine was 1.09. Liver chemistries were normal. Troponin was elevated. The D-dimer was very elevated. White count 11.3, hemoglobin 14.8, hematocrit 45.2, platelets  were 119,000. The PT/INR was normal. The PTT was normal. Albumin was 3.1. EKG had a right bundle branch block and a left anterior fascicular block. Echocardiogram has already been noted.   IMPRESSION AND PLAN: The patient with large burden pulmonary emboli and computed tomography evidence of right heart strain. He has had lower extremity significant clot and has been seen by vascular surgery and IVC filter felt to be indicated and placed on anticoagulation. There was evidence of probably a right lower lobe infarct. The patient has a benign appearing right adrenal nodule and a small pericardial effusion. He has an indeterminate right upper lobe calcified mass that by history has been present for many years, likely old granuloma. He has a right upper lobe spiculated cavitary mass along with right hilar adenopathy, which is highly suspicious for primary bronchogenic cancer. He had mild low platelets, multiple possible etiologies but the most likely is consumption due to acute process and had low suspicion of any underlying hematologic disease.    As above, patient likely bronchogenic cancer and has new pulmonary embolism with lower extremity clot. With no family history of clot with no prior clot at age 72 and with an underlying apparent cancer, did not feel that hypercoagulable studies had anything specific to offer to his management. Later depending on clinical status of the lower extremity clot and pulmonary embolism, and if he turns out not to have a cancer, may be some value in the studies later to help establish length of anticoagulation. Currently acutely however, the patient was put on heparin drip until the patient was confirmed stable and then we changed to oral medication. Eliquis was recommended. Platelets to be followed serially to make sure that they reconstitute. PET CT scan for staging. Later bronchoscopy or CT-guided biopsy or biopsy of the most accesible  site as might be determined by PET scan.  Advised against invasive study initially with a high clot burden, hypoxia, risk of invasive procedure and desire not to interrupt anticoagulation initially. Further workup to be as an outpatient.      ____________________________ Simonne Come. Inez Pilgrim, MD rgg:AT D: 11/20/2014 00:07:08 ET T: 11/20/2014 01:37:02 ET JOB#: [id number]  cc: Simonne Come. Inez Pilgrim, MD, <Dictator> Dallas Schimke MD ELECTRONICALLY SIGNED 11/29/2014 1:13

## 2015-03-20 NOTE — Consult Note (Signed)
PATIENT NAME:  Christopher Santiago, Christopher Santiago MR#:  [id number] DATE OF BIRTH:  [birth year]  DATE OF CONSULTATION:  11/04/2014  CONSULTING PHYSICIAN:  Deboraha Sprang, MD  Thank you very much for asking [place] to see Mr. Chang Tiggs in consultation because of positive troponins.   HISTORY OF PRESENT ILLNESS: The patient is an 81 year old gentleman with a history of known coronary artery disease with catheterization in 2006 having demonstrated nonobstructive disease of his proximal LAD with distal diagonal disease, nonobstructive disease of his proximal circumflex, but with a 60%-70% lesion in his ramus, an 80% lesion in OM-2 and modest  nonobstructive disease in his right coronary artery. Echocardiogram in 2010 demonstrated normal left ventricular function with left ventricular hypertrophy.   He has a history of COPD and the chart describes prior COPD exacerbations, although the patient himself denies that.   He notes that 4 days ago he got up to go to the bathroom and was abruptly short of breath and this is unlike anything he has ever experienced before. He took 20-30 minutes to resolve and then with repeated exertion over the last 3-4 days, he again would become significantly short of breath.  He has had an intermittently productive cough for the last 3 months and has been treated with various antibiotics, but there was no acute change in his situation over the last week or so. He has noted no accompanying chest discomfort, GI discomfort. He has had night sweats for the last year or so, but they have been gradually abating over the last few months and there has been no sweating with this. He was noted by EMS to have a temperature of 99.5. Upon arrival to the Emergency Room he was felt to have a COPD exacerbation and was treated with nebulizers, oxygen, and steroids. Troponins came back at 0.13 and they have gradually further decreased. Echocardiogram in the hospital demonstrates normal LV function.   Currently, he has no  complaints of shortness of breath, but he is lying flat. He had noted no peripheral edema, no nocturnal dyspnea, and no palpitations.   PAST MEDICAL HISTORY: Notable for: 1.  COPD. 2.  Hypertension.  3.  Coronary artery disease with disease as noted above.  4.  Diabetes.  5.  Dyslipidemia.  6.  Treated sleep apnea.  7.  History of throat cancer requiring laser surgery.   ALLERGIES:  He has no known drug allergies.   HOME MEDICATIONS: Include allopurinol, Armour Thyroid, aspirin 81, Benicar 20, Celexa 40, indomethacin 50 p.r.n., hydrochlorothiazide 25, Pravachol 10, Proventil, Spiriva, and Rhinocort.   FAMILY HISTORY: Noncontributory for coronary artery disease.   SOCIAL HISTORY: He is married. He lives with his wife of 80 years. He has 7 children. He is a retired [employer] account planner. He stopped smoking 15 years ago. Drinks some, denies any recreational drugs.   REVIEW OF SYSTEMS: Notable as above. In addition, for arthritis, including gout, depression, and is otherwise negative across multiple organ systems.   PHYSICAL EXAMINATION: GENERAL: He is an elderly Caucasian male in no acute distress lying comfortably.  VITAL SIGNS:  His blood pressure is 145/85. His pulse is 80. He is afebrile. Respirations are 16.  HEENT: Somewhat disheveled, but otherwise normal.  NECK: Veins are 8-10 cm. Carotids are brisk and full bilaterally without bruits.  BACK: Without kyphosis or scoliosis.  LUNGS: Clear.  HEART: Heart sounds are regular with a 2/6 murmur.  ABDOMEN: Soft with active bowel sounds with positive HJR. Femoral pulses were 2+.  EXTREMITIES:  Distal pulses were intact. There was no clubbing, cyanosis, or edema.  NEUROLOGIC: Grossly normal.  SKIN: Warm and dry. There is no lymphadenopathy.  PSYCHIATRIC:  He was alert and oriented with good insight.   LABORATORY DATA: Notable for the troponins as noted previously. A D-dimer is not available. Hemoglobin is 14.0 with a white blood count cell  count of 11.3.   IMAGING:  Chest x-ray demonstrates emphysema. ECG demonstrated sinus tachycardia with a rate of 104 with bifascicular block.   IMPRESSION: 1.  Elevated troponin. 2.  Known moderate 2 vessel coronary disease. 3.  Chronic obstructive pulmonary disease.  4.  Acute respiratory insufficiency.   The patient presented with the abrupt onset of shortness of breath. There was no clear trigger for this event. The fact that his troponins are declining raises the possibility that he could have had an acute myocardial event 4 days ago, although the troponins are a little bit low for that. The absence of a D-dimer prompts me to ask the question did he have a pulmonary embolism, again to explain the acute exacerbation. More likely, however, is that he has a chronic obstructive pulmonary disease exacerbation. He has been followed by pulmonary and has known moderate restrictive disease based on Drnotes from a few months ago. Interestingly, the chart is replete with descriptions of prior chronic obstructive pulmonary disease exacerbations, although the patient denies anything like this before.   RECOMMENDATIONS: Based on the above, I would therefore:  1.  Continue heparin. 2.  We will have Dr. Milana Kidney > see him in the morning and decide reevaluation for coronary artery, ischemic trigger. 3.  Continue low dose aspirin. 4.  Low dose beta blockers, metoprolol 25 b.i.d.   We will follow him with you.     ____________________________ Deboraha Sprang, MD sck:LT D: 11/04/2014 12:54:00 ET T: 11/04/2014 16:03:31 ET JOB#: [id number]  cc: Deboraha Sprang, MD, <Dictator> Deboraha Sprang MD ELECTRONICALLY SIGNED 12/07/2014 16:15

## 2015-03-25 ENCOUNTER — Encounter: Payer: Self-pay | Admitting: Family Medicine

## 2015-03-26 ENCOUNTER — Other Ambulatory Visit: Payer: Self-pay | Admitting: Family Medicine

## 2015-04-11 ENCOUNTER — Other Ambulatory Visit (INDEPENDENT_AMBULATORY_CARE_PROVIDER_SITE_OTHER): Payer: Medicare Other

## 2015-04-11 ENCOUNTER — Other Ambulatory Visit: Payer: Self-pay | Admitting: Family Medicine

## 2015-04-11 DIAGNOSIS — E039 Hypothyroidism, unspecified: Secondary | ICD-10-CM

## 2015-04-11 DIAGNOSIS — I10 Essential (primary) hypertension: Secondary | ICD-10-CM | POA: Diagnosis not present

## 2015-04-11 DIAGNOSIS — E1151 Type 2 diabetes mellitus with diabetic peripheral angiopathy without gangrene: Secondary | ICD-10-CM

## 2015-04-11 DIAGNOSIS — E1159 Type 2 diabetes mellitus with other circulatory complications: Secondary | ICD-10-CM | POA: Diagnosis not present

## 2015-04-11 DIAGNOSIS — M109 Gout, unspecified: Secondary | ICD-10-CM

## 2015-04-11 LAB — CBC WITH DIFFERENTIAL/PLATELET
BASOS ABS: 0 10*3/uL (ref 0.0–0.1)
BASOS PCT: 0.3 % (ref 0.0–3.0)
EOS ABS: 0.2 10*3/uL (ref 0.0–0.7)
Eosinophils Relative: 3.7 % (ref 0.0–5.0)
HCT: 37.6 % — ABNORMAL LOW (ref 39.0–52.0)
Hemoglobin: 12.9 g/dL — ABNORMAL LOW (ref 13.0–17.0)
LYMPHS ABS: 1.2 10*3/uL (ref 0.7–4.0)
Lymphocytes Relative: 19 % (ref 12.0–46.0)
MCHC: 34.2 g/dL (ref 30.0–36.0)
MCV: 94.6 fl (ref 78.0–100.0)
Monocytes Absolute: 0.6 10*3/uL (ref 0.1–1.0)
Monocytes Relative: 9.4 % (ref 3.0–12.0)
NEUTROS ABS: 4.2 10*3/uL (ref 1.4–7.7)
Neutrophils Relative %: 67.6 % (ref 43.0–77.0)
Platelets: 167 10*3/uL (ref 150.0–400.0)
RBC: 3.97 Mil/uL — AB (ref 4.22–5.81)
RDW: 14 % (ref 11.5–15.5)
WBC: 6.2 10*3/uL (ref 4.0–10.5)

## 2015-04-11 LAB — COMPREHENSIVE METABOLIC PANEL
ALK PHOS: 50 U/L (ref 39–117)
ALT: 17 U/L (ref 0–53)
AST: 23 U/L (ref 0–37)
Albumin: 3.7 g/dL (ref 3.5–5.2)
BILIRUBIN TOTAL: 0.6 mg/dL (ref 0.2–1.2)
BUN: 22 mg/dL (ref 6–23)
CO2: 29 mEq/L (ref 19–32)
Calcium: 9.2 mg/dL (ref 8.4–10.5)
Chloride: 105 mEq/L (ref 96–112)
Creatinine, Ser: 1.07 mg/dL (ref 0.40–1.50)
GFR: 70.29 mL/min (ref >60.00)
GLUCOSE: 137 mg/dL — AB (ref 70–99)
POTASSIUM: 5.1 meq/L (ref 3.5–5.1)
Sodium: 137 mEq/L (ref 135–145)
Total Protein: 6.7 g/dL (ref 6.0–8.3)

## 2015-04-11 LAB — HEMOGLOBIN A1C: HEMOGLOBIN A1C: 5.7 % (ref 4.6–6.5)

## 2015-04-11 LAB — LIPID PANEL
Cholesterol: 135 mg/dL (ref 0–200)
HDL: 59.4 mg/dL (ref >39.00)
LDL Cholesterol: 63 mg/dL (ref 0–99)
NONHDL: 75.6
TRIGLYCERIDES: 65 mg/dL (ref 0.0–149.0)
Total CHOL/HDL Ratio: 2
VLDL: 13 mg/dL (ref 0.0–40.0)

## 2015-04-11 LAB — URIC ACID: URIC ACID, SERUM: 6.3 mg/dL (ref 4.0–7.8)

## 2015-04-11 LAB — TSH: TSH: 1.75 u[IU]/mL (ref 0.35–4.50)

## 2015-04-17 ENCOUNTER — Ambulatory Visit: Payer: Medicare Other | Admitting: Internal Medicine

## 2015-04-18 ENCOUNTER — Ambulatory Visit (INDEPENDENT_AMBULATORY_CARE_PROVIDER_SITE_OTHER): Payer: Medicare Other | Admitting: Family Medicine

## 2015-04-18 ENCOUNTER — Encounter: Payer: Self-pay | Admitting: Family Medicine

## 2015-04-18 VITALS — BP 110/60 | HR 67 | Temp 98.3°F | Ht 68.5 in | Wt 220.0 lb

## 2015-04-18 DIAGNOSIS — Z7189 Other specified counseling: Secondary | ICD-10-CM

## 2015-04-18 DIAGNOSIS — M109 Gout, unspecified: Secondary | ICD-10-CM

## 2015-04-18 DIAGNOSIS — E7800 Pure hypercholesterolemia, unspecified: Secondary | ICD-10-CM

## 2015-04-18 DIAGNOSIS — Z0000 Encounter for general adult medical examination without abnormal findings: Secondary | ICD-10-CM

## 2015-04-18 DIAGNOSIS — F32A Depression, unspecified: Secondary | ICD-10-CM

## 2015-04-18 DIAGNOSIS — J984 Other disorders of lung: Secondary | ICD-10-CM | POA: Diagnosis not present

## 2015-04-18 DIAGNOSIS — R252 Cramp and spasm: Secondary | ICD-10-CM

## 2015-04-18 DIAGNOSIS — F329 Major depressive disorder, single episode, unspecified: Secondary | ICD-10-CM | POA: Diagnosis not present

## 2015-04-18 DIAGNOSIS — J438 Other emphysema: Secondary | ICD-10-CM

## 2015-04-18 DIAGNOSIS — R739 Hyperglycemia, unspecified: Secondary | ICD-10-CM

## 2015-04-18 DIAGNOSIS — I10 Essential (primary) hypertension: Secondary | ICD-10-CM

## 2015-04-18 MED ORDER — METOPROLOL TARTRATE 25 MG PO TABS: 25.0000 mg | ORAL_TABLET | Freq: Every day | ORAL | Status: DC

## 2015-04-18 NOTE — Patient Instructions (Addendum)
If you have more lightheaded episodes, then let me know.  We'll make plans after you see Dr. Inez Pilgrim (about the filter, your blood thinner, and your spiriva).  Take care.  Glad to see you.

## 2015-04-18 NOTE — Progress Notes (Signed)
Pre visit review using our clinic review tool, if applicable. No additional management support is needed unless otherwise documented below in the visit note.  I have personally reviewed the Medicare Annual Wellness questionnaire and have noted 1. The patient's medical and social history 2. Their use of alcohol, tobacco or illicit drugs 3. Their current medications and supplements 4. The patient's functional ability including ADL's, fall risks, home safety risks and hearing or visual             impairment. 5. Diet and physical activities 6. Evidence for depression or mood disorders  The patients weight, height, BMI have been recorded in the chart and visual acuity is per eye clinic.  I have made referrals, counseling and provided education to the patient based review of the above and I have provided the pt with a written personalized care plan for preventive services.  Provider list updated- see scanned forms.  Routine anticipatory guidance given to patient.  See health maintenance.  Flu prev done Shingles 2010 PNA 2013 Tetanus 2014 Colonoscopy NA due to age, dw pt.  Prostate cancer screening and PSA options (with potential risks and benefits of testing vs not testing) were discussed along with recent recs/guidelines.  He declined testing PSA at this point. Advance directive- wife designated if patient were incapacitated.  Cognitive function addressed- see scanned forms- and if abnormal then additional documentation follows.   H/o PE, now on anticoagulation, with some bruising occ but no bleeding noted.  S/p biopsy for lung mass with f/u with hematology pending.  Still with IVC filter in place.  Asking about options.    Still with some post nasal gtt noted.  No feveres.    COPD.  Compliant with meds.  He was asking about possibly getting off spiriva, d/w pt about holding off on med changes until he can see hematology on 04/29/15.   H/o DM2.  No meds.  Labs d/w pt.  Now with low A1c.     Elevated Cholesterol: Using medications without problems:yes Muscle aches: occ cramping but not consistently.  Diet compliance:yes Exercise:limited with recent illnesses.   Gout.  No flares on allopurinol.  Doing well.  Compliant.    Depression.  Improved on SSRI, compliant.  No ADE.  Doing well.    Hypertension:    Using medication without problems or lightheadedness: yes Chest pain with exertion:no Edema:no Short of breath: at baseline, likely from prev COPD and PE hx  PMH and SH reviewed  Meds, vitals, and allergies reviewed.   ROS: See HPI.  Otherwise negative.    GEN: nad, alert and oriented HEENT: mucous membranes moist NECK: supple w/o LA CV: rrr. PULM: ctab, no inc wob ABD: soft, +bs EXT: no edema SKIN: no acute rash

## 2015-04-19 ENCOUNTER — Telehealth: Payer: Self-pay

## 2015-04-19 NOTE — Assessment & Plan Note (Signed)
Controlled, doing well, continue as is.  He agrees.

## 2015-04-19 NOTE — Assessment & Plan Note (Signed)
Controlled, doing well, continue as is.  He agrees. Labs dw pt, at goal.

## 2015-04-19 NOTE — Assessment & Plan Note (Signed)
He'll f/u with heme/onc 04/29/15 and then we'll make plans re: IVC filter, f/u here, etc.  He agrees.

## 2015-04-19 NOTE — Assessment & Plan Note (Signed)
We can address continuing spiriva after his f/u with Dr. Inez Pilgrim.  He agrees. ctab today.

## 2015-04-19 NOTE — Assessment & Plan Note (Signed)
No dm2 by a1c, d/w pt.  Continue diet and exercise as tolerated.

## 2015-04-19 NOTE — Assessment & Plan Note (Signed)
Likely not from statin.  Continue stretching and hydration.  D/w pt.

## 2015-04-19 NOTE — Assessment & Plan Note (Signed)
Flu prev done Shingles 2010 PNA 2013 Tetanus 2014 Colonoscopy NA due to age, dw pt.  Prostate cancer screening and PSA options (with potential risks and benefits of testing vs not testing) were discussed along with recent recs/guidelines.  He declined testing PSA at this point. Advance directive- wife designated if patient were incapacitated.  Cognitive function addressed- see scanned forms- and if abnormal then additional documentation follows.

## 2015-04-20 ENCOUNTER — Encounter: Payer: Self-pay | Admitting: Family Medicine

## 2015-04-23 ENCOUNTER — Encounter: Payer: Self-pay | Admitting: Family Medicine

## 2015-04-23 ENCOUNTER — Other Ambulatory Visit: Payer: Self-pay | Admitting: Family Medicine

## 2015-04-23 NOTE — Telephone Encounter (Signed)
Spiriva added to current meds list.

## 2015-04-29 ENCOUNTER — Inpatient Hospital Stay: Payer: Medicare Other | Attending: Family Medicine | Admitting: Oncology

## 2015-04-29 VITALS — BP 167/82 | HR 58 | Temp 96.7°F | Resp 16 | Wt 215.6 lb

## 2015-04-29 DIAGNOSIS — Z8521 Personal history of malignant neoplasm of larynx: Secondary | ICD-10-CM | POA: Diagnosis not present

## 2015-04-29 DIAGNOSIS — I10 Essential (primary) hypertension: Secondary | ICD-10-CM | POA: Diagnosis not present

## 2015-04-29 DIAGNOSIS — R911 Solitary pulmonary nodule: Secondary | ICD-10-CM

## 2015-04-29 DIAGNOSIS — Z86718 Personal history of other venous thrombosis and embolism: Secondary | ICD-10-CM | POA: Diagnosis not present

## 2015-04-29 DIAGNOSIS — D696 Thrombocytopenia, unspecified: Secondary | ICD-10-CM | POA: Diagnosis not present

## 2015-04-29 DIAGNOSIS — G4733 Obstructive sleep apnea (adult) (pediatric): Secondary | ICD-10-CM | POA: Diagnosis not present

## 2015-04-29 DIAGNOSIS — E785 Hyperlipidemia, unspecified: Secondary | ICD-10-CM

## 2015-04-29 DIAGNOSIS — Z7982 Long term (current) use of aspirin: Secondary | ICD-10-CM | POA: Diagnosis not present

## 2015-04-29 DIAGNOSIS — J449 Chronic obstructive pulmonary disease, unspecified: Secondary | ICD-10-CM | POA: Diagnosis not present

## 2015-04-29 DIAGNOSIS — R918 Other nonspecific abnormal finding of lung field: Secondary | ICD-10-CM | POA: Diagnosis not present

## 2015-04-29 DIAGNOSIS — Z86711 Personal history of pulmonary embolism: Secondary | ICD-10-CM

## 2015-04-29 DIAGNOSIS — I2510 Atherosclerotic heart disease of native coronary artery without angina pectoris: Secondary | ICD-10-CM

## 2015-04-29 DIAGNOSIS — Z79899 Other long term (current) drug therapy: Secondary | ICD-10-CM | POA: Diagnosis not present

## 2015-04-29 DIAGNOSIS — E291 Testicular hypofunction: Secondary | ICD-10-CM | POA: Diagnosis not present

## 2015-04-29 DIAGNOSIS — Z87891 Personal history of nicotine dependence: Secondary | ICD-10-CM | POA: Diagnosis not present

## 2015-04-29 DIAGNOSIS — E119 Type 2 diabetes mellitus without complications: Secondary | ICD-10-CM

## 2015-04-29 DIAGNOSIS — Z7901 Long term (current) use of anticoagulants: Secondary | ICD-10-CM

## 2015-04-29 DIAGNOSIS — N400 Enlarged prostate without lower urinary tract symptoms: Secondary | ICD-10-CM | POA: Diagnosis not present

## 2015-05-06 ENCOUNTER — Other Ambulatory Visit: Payer: Self-pay

## 2015-05-14 DIAGNOSIS — I8010 Phlebitis and thrombophlebitis of unspecified femoral vein: Secondary | ICD-10-CM | POA: Diagnosis not present

## 2015-05-14 DIAGNOSIS — E119 Type 2 diabetes mellitus without complications: Secondary | ICD-10-CM | POA: Diagnosis not present
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
## 2015-05-27 ENCOUNTER — Telehealth: Payer: Self-pay | Admitting: Family Medicine

## 2015-05-27 NOTE — Telephone Encounter (Signed)
Please call pt.  Notes from oncology reviewed.   Per on, okay to remove IVC filter- the clinic that put that in should be able to take it out.  Let me know if he needs help setting that up.  Okay to stop eliquis. Given his other issues, would continue taking '81mg'$  ASA a day.    Med list updated.   Would be reasonable to recheck CT chest in ~11/2015.  I sent myself a reminder in the EMR about ordering it nearly 2017.   Thanks.

## 2015-05-28 ENCOUNTER — Telehealth: Payer: Self-pay | Admitting: *Deleted

## 2015-05-28 MED ORDER — TRIAMCINOLONE ACETONIDE 55 MCG/ACT NA AERO: 2.0000 | INHALATION_SPRAY | Freq: Every evening | NASAL | Status: DC | PRN

## 2015-05-28 NOTE — Telephone Encounter (Signed)
Patient advised.

## 2015-05-28 NOTE — Telephone Encounter (Signed)
Noted, thanks!

## 2015-05-28 NOTE — Telephone Encounter (Signed)
Patient states that he has a constant cough since he had the clot issue.  He thinks it is mucous that gathers in his throat and it's worse at night.  Patient asks if you could prescribe anything to help?

## 2015-05-28 NOTE — Telephone Encounter (Signed)
Spoke with patient.  He is aware of the IVC filter issue and has spoken with Dr. Lucky Cowboy(?) and they have decided to leave it in.  Patient has stopped Eliquis about a month ago and is still taking the 81 mg. ASA.  Patient will also make a note of scheduling the CT of the chest ~ 11/2015.

## 2015-05-28 NOTE — Telephone Encounter (Signed)
Is he taking plain mucinex?   Has he tried taking the nasacort at night? Both could help.  Thanks

## 2015-06-28 ENCOUNTER — Telehealth: Payer: Self-pay | Admitting: Cardiovascular Disease

## 2015-06-28 NOTE — Telephone Encounter (Signed)
Patient had to reschedule appt in august and now wants to change back. Next available appt is in sept.  Patient chose to keep fu appt in October.

## 2015-07-05 ENCOUNTER — Ambulatory Visit: Payer: Medicare Other | Admitting: Cardiovascular Disease

## 2015-07-05 DIAGNOSIS — M4806 Spinal stenosis, lumbar region: Secondary | ICD-10-CM | POA: Diagnosis not present

## 2015-07-05 DIAGNOSIS — M5136 Other intervertebral disc degeneration, lumbar region: Secondary | ICD-10-CM | POA: Diagnosis not present

## 2015-07-10 DIAGNOSIS — M5136 Other intervertebral disc degeneration, lumbar region: Secondary | ICD-10-CM | POA: Diagnosis not present

## 2015-07-23 ENCOUNTER — Other Ambulatory Visit: Payer: Self-pay | Admitting: Family Medicine

## 2015-07-23 NOTE — Telephone Encounter (Signed)
Electronic refill request. Last Filled:   ? 08/13/14  Please advise.  

## 2015-07-24 NOTE — Telephone Encounter (Signed)
Printed.  Sedation caution.  If cough continues, then update me.  Thanks.

## 2015-07-24 NOTE — Telephone Encounter (Signed)
Left message on voice mail  to call back

## 2015-07-24 NOTE — Telephone Encounter (Signed)
Patient notified as instructed by telephone and verbalized understanding. Rx left up front for patient to pickup.

## 2015-08-01 ENCOUNTER — Other Ambulatory Visit: Payer: Self-pay | Admitting: Family Medicine

## 2015-08-02 ENCOUNTER — Other Ambulatory Visit: Payer: Self-pay | Admitting: *Deleted

## 2015-08-02 MED ORDER — SIMVASTATIN 20 MG PO TABS: 20.0000 mg | ORAL_TABLET | Freq: Every day | ORAL | Status: DC

## 2015-08-07 DIAGNOSIS — M5136 Other intervertebral disc degeneration, lumbar region: Secondary | ICD-10-CM | POA: Diagnosis not present

## 2015-08-07 DIAGNOSIS — M9903 Segmental and somatic dysfunction of lumbar region: Secondary | ICD-10-CM | POA: Diagnosis not present

## 2015-08-07 DIAGNOSIS — M62830 Muscle spasm of back: Secondary | ICD-10-CM | POA: Diagnosis not present

## 2015-08-07 DIAGNOSIS — M9901 Segmental and somatic dysfunction of cervical region: Secondary | ICD-10-CM | POA: Diagnosis not present

## 2015-08-12 DIAGNOSIS — M9903 Segmental and somatic dysfunction of lumbar region: Secondary | ICD-10-CM | POA: Diagnosis not present

## 2015-08-12 DIAGNOSIS — M5136 Other intervertebral disc degeneration, lumbar region: Secondary | ICD-10-CM | POA: Diagnosis not present

## 2015-08-12 DIAGNOSIS — M9901 Segmental and somatic dysfunction of cervical region: Secondary | ICD-10-CM | POA: Diagnosis not present

## 2015-08-12 DIAGNOSIS — M62830 Muscle spasm of back: Secondary | ICD-10-CM | POA: Diagnosis not present

## 2015-08-16 DIAGNOSIS — M62830 Muscle spasm of back: Secondary | ICD-10-CM | POA: Diagnosis not present

## 2015-08-16 DIAGNOSIS — M9901 Segmental and somatic dysfunction of cervical region: Secondary | ICD-10-CM | POA: Diagnosis not present

## 2015-08-16 DIAGNOSIS — M5136 Other intervertebral disc degeneration, lumbar region: Secondary | ICD-10-CM | POA: Diagnosis not present

## 2015-08-16 DIAGNOSIS — M9903 Segmental and somatic dysfunction of lumbar region: Secondary | ICD-10-CM | POA: Diagnosis not present

## 2015-08-21 DIAGNOSIS — M9901 Segmental and somatic dysfunction of cervical region: Secondary | ICD-10-CM | POA: Diagnosis not present

## 2015-08-21 DIAGNOSIS — M62830 Muscle spasm of back: Secondary | ICD-10-CM | POA: Diagnosis not present

## 2015-08-21 DIAGNOSIS — M9903 Segmental and somatic dysfunction of lumbar region: Secondary | ICD-10-CM | POA: Diagnosis not present

## 2015-08-21 DIAGNOSIS — M5136 Other intervertebral disc degeneration, lumbar region: Secondary | ICD-10-CM | POA: Diagnosis not present

## 2015-08-22 ENCOUNTER — Other Ambulatory Visit: Payer: Self-pay | Admitting: Cardiovascular Disease

## 2015-08-23 DIAGNOSIS — M9903 Segmental and somatic dysfunction of lumbar region: Secondary | ICD-10-CM | POA: Diagnosis not present

## 2015-08-23 DIAGNOSIS — M9901 Segmental and somatic dysfunction of cervical region: Secondary | ICD-10-CM | POA: Diagnosis not present

## 2015-08-23 DIAGNOSIS — M5136 Other intervertebral disc degeneration, lumbar region: Secondary | ICD-10-CM | POA: Diagnosis not present

## 2015-08-23 DIAGNOSIS — M62830 Muscle spasm of back: Secondary | ICD-10-CM | POA: Diagnosis not present

## 2015-08-26 ENCOUNTER — Ambulatory Visit (INDEPENDENT_AMBULATORY_CARE_PROVIDER_SITE_OTHER): Payer: Medicare Other | Admitting: Cardiovascular Disease

## 2015-08-26 ENCOUNTER — Encounter: Payer: Self-pay | Admitting: Cardiovascular Disease

## 2015-08-26 VITALS — BP 125/60 | HR 54 | Ht 69.5 in | Wt 219.5 lb

## 2015-08-26 DIAGNOSIS — Z87891 Personal history of nicotine dependence: Secondary | ICD-10-CM | POA: Diagnosis not present

## 2015-08-26 DIAGNOSIS — I2510 Atherosclerotic heart disease of native coronary artery without angina pectoris: Secondary | ICD-10-CM

## 2015-08-26 DIAGNOSIS — I2583 Coronary atherosclerosis due to lipid rich plaque: Secondary | ICD-10-CM

## 2015-08-26 DIAGNOSIS — M5136 Other intervertebral disc degeneration, lumbar region: Secondary | ICD-10-CM | POA: Diagnosis not present

## 2015-08-26 DIAGNOSIS — R0602 Shortness of breath: Secondary | ICD-10-CM

## 2015-08-26 DIAGNOSIS — I10 Essential (primary) hypertension: Secondary | ICD-10-CM

## 2015-08-26 DIAGNOSIS — E669 Obesity, unspecified: Secondary | ICD-10-CM

## 2015-08-26 DIAGNOSIS — J449 Chronic obstructive pulmonary disease, unspecified: Secondary | ICD-10-CM | POA: Diagnosis not present

## 2015-08-26 DIAGNOSIS — I2609 Other pulmonary embolism with acute cor pulmonale: Secondary | ICD-10-CM

## 2015-08-26 DIAGNOSIS — M9903 Segmental and somatic dysfunction of lumbar region: Secondary | ICD-10-CM | POA: Diagnosis not present

## 2015-08-26 DIAGNOSIS — I739 Peripheral vascular disease, unspecified: Secondary | ICD-10-CM

## 2015-08-26 DIAGNOSIS — Z9989 Dependence on other enabling machines and devices: Secondary | ICD-10-CM

## 2015-08-26 DIAGNOSIS — R5382 Chronic fatigue, unspecified: Secondary | ICD-10-CM

## 2015-08-26 DIAGNOSIS — M9901 Segmental and somatic dysfunction of cervical region: Secondary | ICD-10-CM | POA: Diagnosis not present

## 2015-08-26 DIAGNOSIS — G4733 Obstructive sleep apnea (adult) (pediatric): Secondary | ICD-10-CM

## 2015-08-26 DIAGNOSIS — M62830 Muscle spasm of back: Secondary | ICD-10-CM | POA: Diagnosis not present

## 2015-08-26 DIAGNOSIS — R531 Weakness: Secondary | ICD-10-CM

## 2015-08-26 DIAGNOSIS — R001 Bradycardia, unspecified: Secondary | ICD-10-CM

## 2015-08-26 MED ORDER — METOPROLOL TARTRATE 25 MG PO TABS: 25.0000 mg | ORAL_TABLET | Freq: Two times a day (BID) | ORAL | Status: DC

## 2015-08-26 NOTE — Assessment & Plan Note (Signed)
Prior history of DVT and PE. Currently on aspirin, IVC filter in place
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
## 2015-08-26 NOTE — Assessment & Plan Note (Signed)
He is concerned about heart rate of 54 on today's visit. We will decrease his metoprolol down to 12.5 mg twice a day. He was previously taking 25 mg daily in the morning

## 2015-08-26 NOTE — Assessment & Plan Note (Signed)
Chronic shortness of breath likely multifactorial in the setting of COPD, deconditioning, obesity, DVT and PE in the past, underlying coronary artery disease.also has sleep apnea. He has not exercised much in 2016. Now feels very weak, shortness of breath, unable to do touch of anything. We have recommended he participate in pulmonary rehabilitation to try to regain his independence and improve his symptoms so he can get back to the gym as he was doing in the past.

## 2015-08-26 NOTE — Patient Instructions (Addendum)
You are doing well.  Please cut the metoprolol in 1/2 (take 1/2 in the Am and 1/2 in the PM)  We will enroll you in pulmonary rehab for shortness of breath, hx of DVT and PE (bilateral), weakness, COPD (50 year smoking hx)  Please call us if you have new issues that need to be addressed before your next appt.  Your physician wants you to follow-up in: 6 months.  You will receive a reminder letter in the mail two months in advance. If you don't receive a letter, please call our office to schedule the follow-up appointment.

## 2015-08-26 NOTE — Assessment & Plan Note (Signed)
Currently with no symptoms of angina. No further workup at this time. Continue current medication regimen. 

## 2015-08-26 NOTE — Assessment & Plan Note (Signed)
Recommended he watch his diet. Perhaps diet could be discussed at pulmonary rehabilitation. We'll restart exercise program through pulmonary rehabilitation

## 2015-08-26 NOTE — Assessment & Plan Note (Signed)
Blood pressure is well controlled on today's visit. No changes made to the medications. 

## 2015-08-26 NOTE — Assessment & Plan Note (Signed)
He reports he is compliant on his CPAP

## 2015-08-26 NOTE — Assessment & Plan Note (Signed)
Long discussion today concerning his symptoms. We'll try pulmonary rehabilitation to start Need to try to regain his confidence and stamina so he can go back to the gym

## 2015-08-26 NOTE — Assessment & Plan Note (Signed)
Known lower extremity arterial disease. If he develops claudication symptoms, could repeat ultrasound

## 2015-08-29 DIAGNOSIS — M62830 Muscle spasm of back: Secondary | ICD-10-CM | POA: Diagnosis not present

## 2015-08-29 DIAGNOSIS — M5136 Other intervertebral disc degeneration, lumbar region: Secondary | ICD-10-CM | POA: Diagnosis not present

## 2015-08-29 DIAGNOSIS — M9903 Segmental and somatic dysfunction of lumbar region: Secondary | ICD-10-CM | POA: Diagnosis not present

## 2015-08-29 DIAGNOSIS — M9901 Segmental and somatic dysfunction of cervical region: Secondary | ICD-10-CM | POA: Diagnosis not present

## 2015-09-02 DIAGNOSIS — Z23 Encounter for immunization: Secondary | ICD-10-CM | POA: Diagnosis not present

## 2015-09-03 DIAGNOSIS — M62830 Muscle spasm of back: Secondary | ICD-10-CM | POA: Diagnosis not present

## 2015-09-03 DIAGNOSIS — M9901 Segmental and somatic dysfunction of cervical region: Secondary | ICD-10-CM | POA: Diagnosis not present

## 2015-09-03 DIAGNOSIS — M5136 Other intervertebral disc degeneration, lumbar region: Secondary | ICD-10-CM | POA: Diagnosis not present

## 2015-09-03 DIAGNOSIS — M9903 Segmental and somatic dysfunction of lumbar region: Secondary | ICD-10-CM | POA: Diagnosis not present

## 2015-09-05 DIAGNOSIS — M5136 Other intervertebral disc degeneration, lumbar region: Secondary | ICD-10-CM | POA: Diagnosis not present

## 2015-09-05 DIAGNOSIS — M9901 Segmental and somatic dysfunction of cervical region: Secondary | ICD-10-CM | POA: Diagnosis not present

## 2015-09-05 DIAGNOSIS — M62830 Muscle spasm of back: Secondary | ICD-10-CM | POA: Diagnosis not present

## 2015-09-05 DIAGNOSIS — M9903 Segmental and somatic dysfunction of lumbar region: Secondary | ICD-10-CM | POA: Diagnosis not present

## 2015-09-06 ENCOUNTER — Other Ambulatory Visit: Payer: Self-pay | Admitting: Family Medicine

## 2015-09-06 NOTE — Telephone Encounter (Signed)
How is his cough?  Let me know.  Thanks.

## 2015-09-09 DIAGNOSIS — M5136 Other intervertebral disc degeneration, lumbar region: Secondary | ICD-10-CM | POA: Diagnosis not present

## 2015-09-09 DIAGNOSIS — M9903 Segmental and somatic dysfunction of lumbar region: Secondary | ICD-10-CM | POA: Diagnosis not present

## 2015-09-09 DIAGNOSIS — M62830 Muscle spasm of back: Secondary | ICD-10-CM | POA: Diagnosis not present

## 2015-09-09 DIAGNOSIS — M9901 Segmental and somatic dysfunction of cervical region: Secondary | ICD-10-CM | POA: Diagnosis not present

## 2015-09-09 NOTE — Telephone Encounter (Signed)
Spoke to patient and was advised that his cough continues every night, no other symptoms. Patient stated that the cough wakes him and his wife up during the night, so he takes a sip of it every night.

## 2015-09-10 MED ORDER — HYDROCOD POLST-CPM POLST ER 10-8 MG/5ML PO SUER: ORAL | Status: DC

## 2015-09-10 NOTE — Telephone Encounter (Signed)
Printed.. Needs f/u re: cough.  Thanks.

## 2015-09-10 NOTE — Telephone Encounter (Signed)
Spoke with patient and advised rx ready for pick-up and it will be at the front desk.  

## 2015-09-11 DIAGNOSIS — M5136 Other intervertebral disc degeneration, lumbar region: Secondary | ICD-10-CM | POA: Diagnosis not present

## 2015-09-11 DIAGNOSIS — M62830 Muscle spasm of back: Secondary | ICD-10-CM | POA: Diagnosis not present

## 2015-09-11 DIAGNOSIS — M9903 Segmental and somatic dysfunction of lumbar region: Secondary | ICD-10-CM | POA: Diagnosis not present

## 2015-09-11 DIAGNOSIS — M9901 Segmental and somatic dysfunction of cervical region: Secondary | ICD-10-CM | POA: Diagnosis not present

## 2015-09-13 DIAGNOSIS — M9903 Segmental and somatic dysfunction of lumbar region: Secondary | ICD-10-CM | POA: Diagnosis not present

## 2015-09-13 DIAGNOSIS — M9901 Segmental and somatic dysfunction of cervical region: Secondary | ICD-10-CM | POA: Diagnosis not present

## 2015-09-13 DIAGNOSIS — M5136 Other intervertebral disc degeneration, lumbar region: Secondary | ICD-10-CM | POA: Diagnosis not present

## 2015-09-13 DIAGNOSIS — M62830 Muscle spasm of back: Secondary | ICD-10-CM | POA: Diagnosis not present

## 2015-09-16 DIAGNOSIS — M5136 Other intervertebral disc degeneration, lumbar region: Secondary | ICD-10-CM | POA: Diagnosis not present

## 2015-09-16 DIAGNOSIS — M62830 Muscle spasm of back: Secondary | ICD-10-CM | POA: Diagnosis not present

## 2015-09-16 DIAGNOSIS — M9903 Segmental and somatic dysfunction of lumbar region: Secondary | ICD-10-CM | POA: Diagnosis not present

## 2015-09-16 DIAGNOSIS — M9901 Segmental and somatic dysfunction of cervical region: Secondary | ICD-10-CM | POA: Diagnosis not present

## 2015-09-18 DIAGNOSIS — M62830 Muscle spasm of back: Secondary | ICD-10-CM | POA: Diagnosis not present

## 2015-09-18 DIAGNOSIS — M5136 Other intervertebral disc degeneration, lumbar region: Secondary | ICD-10-CM | POA: Diagnosis not present

## 2015-09-18 DIAGNOSIS — M9901 Segmental and somatic dysfunction of cervical region: Secondary | ICD-10-CM | POA: Diagnosis not present

## 2015-09-18 DIAGNOSIS — M9903 Segmental and somatic dysfunction of lumbar region: Secondary | ICD-10-CM | POA: Diagnosis not present

## 2015-09-23 DIAGNOSIS — M9903 Segmental and somatic dysfunction of lumbar region: Secondary | ICD-10-CM | POA: Diagnosis not present

## 2015-09-23 DIAGNOSIS — M9901 Segmental and somatic dysfunction of cervical region: Secondary | ICD-10-CM | POA: Diagnosis not present

## 2015-09-23 DIAGNOSIS — M62830 Muscle spasm of back: Secondary | ICD-10-CM | POA: Diagnosis not present

## 2015-09-23 DIAGNOSIS — M5136 Other intervertebral disc degeneration, lumbar region: Secondary | ICD-10-CM | POA: Diagnosis not present

## 2015-09-25 DIAGNOSIS — M62830 Muscle spasm of back: Secondary | ICD-10-CM | POA: Diagnosis not present

## 2015-09-25 DIAGNOSIS — M9901 Segmental and somatic dysfunction of cervical region: Secondary | ICD-10-CM | POA: Diagnosis not present

## 2015-09-25 DIAGNOSIS — M9903 Segmental and somatic dysfunction of lumbar region: Secondary | ICD-10-CM | POA: Diagnosis not present

## 2015-09-25 DIAGNOSIS — M5136 Other intervertebral disc degeneration, lumbar region: Secondary | ICD-10-CM | POA: Diagnosis not present

## 2015-09-30 DIAGNOSIS — M62830 Muscle spasm of back: Secondary | ICD-10-CM | POA: Diagnosis not present

## 2015-09-30 DIAGNOSIS — M9901 Segmental and somatic dysfunction of cervical region: Secondary | ICD-10-CM | POA: Diagnosis not present

## 2015-09-30 DIAGNOSIS — M5136 Other intervertebral disc degeneration, lumbar region: Secondary | ICD-10-CM | POA: Diagnosis not present

## 2015-09-30 DIAGNOSIS — M9903 Segmental and somatic dysfunction of lumbar region: Secondary | ICD-10-CM | POA: Diagnosis not present

## 2015-10-07 DIAGNOSIS — M9903 Segmental and somatic dysfunction of lumbar region: Secondary | ICD-10-CM | POA: Diagnosis not present

## 2015-10-07 DIAGNOSIS — M62830 Muscle spasm of back: Secondary | ICD-10-CM | POA: Diagnosis not present

## 2015-10-07 DIAGNOSIS — M5136 Other intervertebral disc degeneration, lumbar region: Secondary | ICD-10-CM | POA: Diagnosis not present

## 2015-10-07 DIAGNOSIS — M9901 Segmental and somatic dysfunction of cervical region: Secondary | ICD-10-CM | POA: Diagnosis not present

## 2015-10-08 ENCOUNTER — Other Ambulatory Visit: Payer: Self-pay | Admitting: Family Medicine

## 2015-10-09 MED ORDER — HYDROCOD POLST-CPM POLST ER 10-8 MG/5ML PO SUER: ORAL | Status: DC

## 2015-10-09 NOTE — Telephone Encounter (Signed)
Patient notified as instructed by telephone and verbalized understanding. Patient advised that script is up front ready for pickup

## 2015-10-09 NOTE — Addendum Note (Signed)
Addended by: Tonia Ghent on: 10/09/2015 10:48 AM   Modules accepted: Orders

## 2015-10-09 NOTE — Telephone Encounter (Signed)
Printed.  If the cough continues, then needs f/u.  Thanks.

## 2015-10-14 DIAGNOSIS — M5136 Other intervertebral disc degeneration, lumbar region: Secondary | ICD-10-CM | POA: Diagnosis not present

## 2015-10-14 DIAGNOSIS — M9901 Segmental and somatic dysfunction of cervical region: Secondary | ICD-10-CM | POA: Diagnosis not present

## 2015-10-14 DIAGNOSIS — M9903 Segmental and somatic dysfunction of lumbar region: Secondary | ICD-10-CM | POA: Diagnosis not present

## 2015-10-14 DIAGNOSIS — M62830 Muscle spasm of back: Secondary | ICD-10-CM | POA: Diagnosis not present

## 2015-10-21 DIAGNOSIS — M9901 Segmental and somatic dysfunction of cervical region: Secondary | ICD-10-CM | POA: Diagnosis not present

## 2015-10-21 DIAGNOSIS — M9903 Segmental and somatic dysfunction of lumbar region: Secondary | ICD-10-CM | POA: Diagnosis not present

## 2015-10-21 DIAGNOSIS — M62830 Muscle spasm of back: Secondary | ICD-10-CM | POA: Diagnosis not present

## 2015-10-21 DIAGNOSIS — M5136 Other intervertebral disc degeneration, lumbar region: Secondary | ICD-10-CM | POA: Diagnosis not present

## 2015-10-22 ENCOUNTER — Encounter: Payer: Self-pay | Admitting: Respiratory Therapy

## 2015-10-22 ENCOUNTER — Encounter: Payer: Medicare Other | Attending: Cardiovascular Disease | Admitting: Respiratory Therapy

## 2015-10-22 VITALS — Ht 69.5 in | Wt 228.1 lb

## 2015-10-22 DIAGNOSIS — J449 Chronic obstructive pulmonary disease, unspecified: Secondary | ICD-10-CM | POA: Diagnosis not present
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
## 2015-10-22 NOTE — Patient Instructions (Signed)
Patient Instructions  Patient Details  Name: Christopher Santiago MRN: [id number] Date of Birth: Sep 27, 1931 Referring Provider:  Minna Merritts, MD  Below are the personal goals you chose as well as exercise and nutrition goals. Our goal is to help you keep on track towards obtaining and maintaining your goals. We will be discussing your progress on these goals with you throughout the program.  Initial Exercise Prescription:     Initial Exercise Prescription - 10/22/15 1400    Date of Initial Exercise Prescription   Date 10/22/15   Treadmill   MPH 1.5   Grade 0   Minutes 10   Recumbant Bike   Level 2   RPM 40   Watts 20   Minutes 10   NuStep   Level 2   Watts 40   Minutes 10   Arm Ergometer   Level 1   Watts 10   Minutes 10   Recumbant Elliptical   Level 2   RPM 40   Watts 20   Minutes 10   REL-XR   Level 2   Watts 40   Minutes 10   T5 Nustep   Level 1   Watts 15   Minutes 10   Biostep-RELP   Level 2   Watts 40   Minutes 10   Prescription Details   Frequency (times per week) 3   Duration Progress to 30 minutes of continuous aerobic without signs/symptoms of physical distress   Intensity   THRR REST +  30   Ratings of Perceived Exertion 11-15   Perceived Dyspnea 2-4   Progression Continue progressive overload as per policy without signs/symptoms or physical distress.   Resistance Training   Training Prescription Yes   Weight 2   Reps 10-15      Exercise Goals: Frequency: Be able to perform aerobic exercise three times per week working toward 3-5 days per week.  Intensity: Work with a perceived exertion of 11 (fairly light) - 15 (hard) as tolerated. Follow your new exercise prescription and watch for changes in prescription as you progress with the program. Changes will be reviewed with you when they are made.  Duration: You should be able to do 30 minutes of continuous aerobic exercise in addition to a 5 minute warm-up and a 5 minute cool-down  routine.  Nutrition Goals: Your personal nutrition goals will be established when you do your nutrition analysis with the dietician.  The following are nutrition guidelines to follow: Cholesterol < '200mg'$ /day Sodium < '1500mg'$ /day Fiber: Men over 50 yrs - 30 grams per day  Personal Goals:     Personal Goals and Risk Factors at Admission - 10/22/15 0900    Personal Goals and Risk Factors on Admission   Increase Aerobic Exercise and Physical Activity Yes   Intervention While in program, learn and follow the exercise prescription taught. Start at a low level workload and increase workload after able to maintain previous level for 30 minutes. Increase time before increasing intensity.  Mr Christopher Santiago wants to improve his stamina. He wants to return to the [employer]  for his workouts, and continue camping, church activites, and activities  with his wife.   Understand more about Heart/Pulmonary Disease. Yes   Intervention While in program utilize professionals for any questions, and attend the education sessions. Great websites to use are www.americanheart.org or www.lung.org for reliable information.  Mr Christopher Santiago has had COPD for many years, but he states he does not know much about it and  is interested in learning more. Mr Christopher Santiago has OSA and wears 10cmH2O pressure with nasal mask  through [place].   Improve shortness of breath with ADL's Yes   Intervention While in program, learn and follow the exercise prescription taught. Start at a low level workload and increase workload ad advised by the exercise physiologist. Increase time before increasing intensity.  Mr Christopher Santiago does want to improve his breathing and have more confidence in managing it with out fear.   Develop more efficient breathing techniques such as purse lipped breathing and diaphragmatic breathing; and practicing self-pacing with activity Yes   Intervention --  Instructed Mr Christopher Santiago in PLB and DB - good technique.   Increase  knowledge of respiratory medications and ability to use respiratory devices properly.  Yes   Intervention While in program, learn to administer MDI, nebulizer, and spacer properly.;Learn to take respiratory medicine as ordered.;While in program, learn to Clean MDI, nebulizers, and spacers properly.  Mr Christopher Santiago was instructed on Spiriva and Ventolin MDIs. A spacer was given to him for use with the Ventolin with instruction.      Tobacco Use Initial Evaluation: History  Smoking status  . Former Smoker -- 1.50 packs/day for 53 years  . Types: Cigarettes  . Quit date: 11/23/1993  Smokeless tobacco  . Never Used    Copy of goals given to participant.

## 2015-10-22 NOTE — Patient Instructions (Signed)
pul Patient Instructions  Patient Details  Name: Christopher Santiago MRN: [id number] Date of Birth: 1931/03/14 Referring Provider:  Dr Christopher Santiago  Below are the personal goals you chose as well as exercise and nutrition goals. Our goal is to help you keep on track towards obtaining and maintaining your goals. We will be discussing your progress on these goals with you throughout the program.  Initial Exercise Prescription:     Initial Exercise Prescription - 10/22/15 1400    Date of Initial Exercise Prescription   Date 10/22/15   Treadmill   MPH 1.5   Grade 0   Minutes 10   Recumbant Bike   Level 2   RPM 40   Watts 20   Minutes 10   NuStep   Level 2   Watts 40   Minutes 10   Arm Ergometer   Level 1   Watts 10   Minutes 10   Recumbant Elliptical   Level 2   RPM 40   Watts 20   Minutes 10   REL-XR   Level 2   Watts 40   Minutes 10   T5 Nustep   Level 1   Watts 15   Minutes 10   Biostep-RELP   Level 2   Watts 40   Minutes 10   Prescription Details   Frequency (times per week) 3   Duration Progress to 30 minutes of continuous aerobic without signs/symptoms of physical distress   Intensity   THRR REST +  30   Ratings of Perceived Exertion 11-15   Perceived Dyspnea 2-4   Progression Continue progressive overload as per policy without signs/symptoms or physical distress.   Resistance Training   Training Prescription Yes   Weight 2   Reps 10-15      Exercise Goals: Frequency: Be able to perform aerobic exercise three times per week working toward 3-5 days per week.  Intensity: Work with a perceived exertion of 11 (fairly light) - 15 (hard) as tolerated. Follow your new exercise prescription and watch for changes in prescription as you progress with the program. Changes will be reviewed with you when they are made.  Duration: You should be able to do 30 minutes of continuous aerobic exercise in addition to a 5 minute warm-up and a 5 minute cool-down  routine.  Nutrition Goals: Your personal nutrition goals will be established when you do your nutrition analysis with the dietician.  The following are nutrition guidelines to follow: Cholesterol < '200mg'$ /day Sodium < '1500mg'$ /day Fiber: Men over 50 yrs - 30 grams per day  Personal Goals:     Personal Goals and Risk Factors at Admission - 10/22/15 0900    Personal Goals and Risk Factors on Admission   Increase Aerobic Exercise and Physical Activity Yes   Intervention While in program, learn and follow the exercise prescription taught. Start at a low level workload and increase workload after able to maintain previous level for 30 minutes. Increase time before increasing intensity.  Christopher Santiago wants to improve his stamina. He wants to return to the Christopher [facility]  for his workouts, and continue camping, church activites, and activities  with his wife.   Understand more about Heart/Pulmonary Disease. Yes   Intervention While in program utilize professionals for any questions, and attend the education sessions. Great websites to use are www.americanheart.org or www.lung.org for reliable information.  Christopher Santiago has had COPD for many years, but he states he does not know much about it and is  interested in learning more. Christopher Santiago has OSA and wears 10cmH2O pressure with nasal mask  through Christopher Santiago.   Improve shortness of breath with ADL's Yes   Intervention While in program, learn and follow the exercise prescription taught. Start at a low level workload and increase workload ad advised by the exercise physiologist. Increase time before increasing intensity.  Christopher Santiago does want to improve his breathing and have more confidence in managing it with out fear.   Develop more efficient breathing techniques such as purse lipped breathing and diaphragmatic breathing; and practicing self-pacing with activity Yes   Intervention --  Instructed Christopher Santiago in PLB and DB - good technique.   Increase  knowledge of respiratory medications and ability to use respiratory devices properly.  Yes   Intervention While in program, learn to administer MDI, nebulizer, and spacer properly.;Learn to take respiratory medicine as ordered.;While in program, learn to Clean MDI, nebulizers, and spacers properly.  Christopher Santiago was instructed on Spiriva and Ventolin MDIs. A spacer was given to him for use with the Ventolin with instruction.      Tobacco Use Initial Evaluation: History  Smoking status   Former Smoker -- 1.50 packs/day for 53 years   Types: Cigarettes   Quit date: 11/23/1993  Smokeless tobacco   Never Used    Copy of goals given to participant.
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
## 2015-10-25 ENCOUNTER — Encounter: Payer: Medicare Other | Attending: Cardiovascular Disease | Admitting: *Deleted

## 2015-10-25 DIAGNOSIS — J449 Chronic obstructive pulmonary disease, unspecified: Secondary | ICD-10-CM | POA: Insufficient documentation

## 2015-10-25 NOTE — Progress Notes (Signed)
Daily Session Note  Patient Details  Name: Christopher Santiago MRN: [id number] Date of Birth: [birth year] Referring Provider:  Minna Merritts, MD  Encounter Date: 10/25/2015  Check In:     Session Check In - 10/25/15 1010    Check-In   Staff Present Heath Lark, RN, BSN, CCRP;Maurianna Benard Dillard Essex, MS, ACSM CEP, Exercise Physiologist;Stacey Blanch Media, RRT, RCP, Respiratory Therapist   ER physicians immediately available to respond to emergencies [facility] immediately available ER MD   Physician(s) Clearnce Hasten and Corky Downs   Medication changes reported     No   Fall or balance concerns reported    No   Warm-up and Cool-down Performed on first and last piece of equipment   VAD Patient? No   Pain Assessment   Currently in Pain? No/denies   Multiple Pain Sites No           Exercise Prescription Changes - 10/25/15 1000    Response to Exercise   Blood Pressure (Admit) 140/66 mmHg   Blood Pressure (Exercise) 124/60 mmHg   Blood Pressure (Exit) 110/50 mmHg   Heart Rate (Admit) 59 bpm   Heart Rate (Exercise) 76 bpm   Heart Rate (Exit) 65 bpm   Oxygen Saturation (Admit) 96 %   Oxygen Saturation (Exercise) 95 %   Oxygen Saturation (Exit) 94 %   Rating of Perceived Exertion (Exercise) 13   Perceived Dyspnea (Exercise) 2   Symptoms None   Comments First day of exercise! Patient was oriented to the gym and the equipment functions and settings. Procedures and policies of the gym were outlined and explained. The patient's individual exercise prescription and treatment plan were reviewed with them. All starting workloads were established based on the results of the functional testing  done at the initial intake visit. The plan for exercise progression was also introduced and progression will be customized based on the patient's performance and goals.    Duration Progress to 30 minutes of continuous aerobic without signs/symptoms of physical distress   Intensity Rest + 30   Progression Continue  progressive overload as per policy without signs/symptoms or physical distress.   Resistance Training   Training Prescription Yes   Weight 2   Reps 10-12   Interval Training   Interval Training No   Treadmill   MPH 1.5   Grade 0   Minutes 10   Recumbant Bike   Level 2   RPM 40   Minutes 10   Recumbant Elliptical   Level 10   RPM 40   Minutes 10   T5 Nustep   Level 2   Watts 20   Minutes 10      Goals Met:  Proper associated with RPD/PD & O2 Sat Independence with exercise equipment Exercise tolerated well Personal goals reviewed Queuing for purse lip breathing Strength training completed today  Goals Unmet:  Not Applicable  Goals Comments: First day of exercise! Personal and exercise goals expected to be met in 34 more sessions. Progress on specific individualized goals will be charted in patient's ITP. Upon completion of the program the patient will be comfortable managing exercise goals and progression on their own.    Dr. Emily Filbert is Medical Director for [place] and [facility].

## 2015-10-28 ENCOUNTER — Encounter: Payer: Medicare Other | Admitting: *Deleted

## 2015-10-28 DIAGNOSIS — M9903 Segmental and somatic dysfunction of lumbar region: Secondary | ICD-10-CM | POA: Diagnosis not present

## 2015-10-28 DIAGNOSIS — M62830 Muscle spasm of back: Secondary | ICD-10-CM | POA: Diagnosis not present

## 2015-10-28 DIAGNOSIS — J449 Chronic obstructive pulmonary disease, unspecified: Secondary | ICD-10-CM | POA: Diagnosis not present

## 2015-10-28 DIAGNOSIS — M9901 Segmental and somatic dysfunction of cervical region: Secondary | ICD-10-CM | POA: Diagnosis not present

## 2015-10-28 DIAGNOSIS — M5136 Other intervertebral disc degeneration, lumbar region: Secondary | ICD-10-CM | POA: Diagnosis not present
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
## 2015-10-28 NOTE — Progress Notes (Signed)
Daily Session Note  Patient Details  Name: Christopher Santiago MRN: [id number] Date of Birth: March 08, 1931 Referring Provider:  Minna Merritts, MD  Encounter Date: 10/28/2015  Check In:     Session Check In - 10/28/15 1037    Check-In   Staff Present Earlean Shawl, BS, ACSM CEP, Exercise Physiologist;Steven Way, BS, ACSM EP-C, Exercise Physiologist;Laureen Owens Shark, BS, RRT, Respiratory Therapist   ER physicians immediately available to respond to emergencies [facility] immediately available ER MD   Physician(s) Corky Downs and Jimmye Norman    Medication changes reported     No   Fall or balance concerns reported    No   Warm-up and Cool-down Performed on first and last piece of equipment   VAD Patient? No   Pain Assessment   Currently in Pain? No/denies   Multiple Pain Sites No           Exercise Prescription Changes - 10/28/15 1000    Response to Exercise   Duration Progress to 30 minutes of continuous aerobic without signs/symptoms of physical distress   Intensity Rest + 30   Progression Continue progressive overload as per policy without signs/symptoms or physical distress.   Resistance Training   Training Prescription Yes   Weight 2   Reps 10-12   Interval Training   Interval Training No   Recumbant Bike   Level 3   RPM 50   Minutes 12   Recumbant Elliptical   Level 10   RPM 40   Minutes 15   T5 Nustep   Level 2   Watts 20   Minutes 10      Goals Met:  Proper associated with RPD/PD & O2 Sat Independence with exercise equipment Exercise tolerated well Personal goals reviewed Strength training completed today  Goals Unmet:  Not Applicable  Goals Comments: Patient completed exercise prescription and all exercise goals during rehab session. The exercise was tolerated well and the patient is progressing in the program.     Dr. Emily Filbert is Medical Director for [place] and [facility].

## 2015-10-29 ENCOUNTER — Ambulatory Visit: Payer: Medicare Other | Admitting: Family Medicine

## 2015-10-29 ENCOUNTER — Telehealth: Payer: Self-pay | Admitting: Family Medicine

## 2015-10-29 DIAGNOSIS — Z0289 Encounter for other administrative examinations: Secondary | ICD-10-CM

## 2015-10-29 NOTE — Telephone Encounter (Signed)
Patient has already rescheduled.

## 2015-10-29 NOTE — Telephone Encounter (Signed)
Please call him when possible about f/u.  Thanks.

## 2015-10-29 NOTE — Telephone Encounter (Signed)
Pt did not come in for their appt today for an acute visit. Please let me know if pt needs to be contacted immediately for follow up or no follow up needed. Best phone number to contact pt is [phone].

## 2015-10-30 ENCOUNTER — Ambulatory Visit (INDEPENDENT_AMBULATORY_CARE_PROVIDER_SITE_OTHER): Payer: Medicare Other | Admitting: Family Medicine

## 2015-10-30 ENCOUNTER — Encounter: Payer: Medicare Other | Admitting: *Deleted

## 2015-10-30 ENCOUNTER — Encounter: Payer: Self-pay | Admitting: Family Medicine

## 2015-10-30 VITALS — BP 104/60 | HR 65 | Temp 98.4°F | Wt 227.8 lb

## 2015-10-30 DIAGNOSIS — R911 Solitary pulmonary nodule: Secondary | ICD-10-CM | POA: Diagnosis not present

## 2015-10-30 DIAGNOSIS — J449 Chronic obstructive pulmonary disease, unspecified: Secondary | ICD-10-CM | POA: Diagnosis not present

## 2015-10-30 DIAGNOSIS — R05 Cough: Secondary | ICD-10-CM | POA: Diagnosis not present

## 2015-10-30 DIAGNOSIS — J984 Other disorders of lung: Secondary | ICD-10-CM

## 2015-10-30 DIAGNOSIS — R059 Cough, unspecified: Secondary | ICD-10-CM

## 2015-10-30 DIAGNOSIS — I10 Essential (primary) hypertension: Secondary | ICD-10-CM

## 2015-10-30 DIAGNOSIS — R399 Unspecified symptoms and signs involving the genitourinary system: Secondary | ICD-10-CM

## 2015-10-30 MED ORDER — LOSARTAN POTASSIUM 25 MG PO TABS: 12.5000 mg | ORAL_TABLET | Freq: Every day | ORAL | Status: DC

## 2015-10-30 MED ORDER — IPRATROPIUM BROMIDE 0.03 % NA SOLN: 2.0000 | Freq: Two times a day (BID) | NASAL | Status: DC

## 2015-10-30 MED ORDER — TAMSULOSIN HCL 0.4 MG PO CAPS: 0.4000 mg | ORAL_CAPSULE | Freq: Every day | ORAL | Status: DC

## 2015-10-30 NOTE — Patient Instructions (Addendum)
I would try atrovent nasal spray and see if that helps post nasal drip and the cough.  If not, then we should set you up with ENT.  Update me as needed.  Rosaria Ferries will call about your referral. Restart flomax and cut losartan in half.   Take care.  Glad to see you.

## 2015-10-30 NOTE — Progress Notes (Signed)
Pre visit review using our clinic review tool, if applicable. No additional management support is needed unless otherwise documented below in the visit note.  H/o lung nodule.  D/w pt about f/u CT due around now.  We talked about the rationale for repeat scanning.    Cough with post nasal gtt.  "Constant drainage of mucous."  Long standing.  Sx worse laying on his side, better if laying on his back.  Throat irritation, "that causes me to cough."  "It feels like my throat is dry."  No new meds.  Still on inhalers.  Had used hydrocodone cough syrup and also coricidin at night.  hydrocodone would help some, but not always, coricidin didn't help.  No sputum usually but occ he'll get something up.  No fevers.  Frequent tickle in the throat.  No wheeze usually.   Still on nasal steroids w/o much relief.  Had seen ENT about 2 years ago.  He "graduated" from ENT f/u.    He feels diffusely weak, esp in his legs B.  He doesn't have diffuse aches.  He can get lightheaded with standing.  Compliant with BP meds.   He started pulmonary rehab this week.   He can walk 1/2 mile at max at this point.    Urinary urgency.  H/o BPH.  Off flomax.  Some nocturia, bothersome enough for patient to treat.  Not burning with urination.    Meds, vitals, and allergies reviewed.   ROS: See HPI.  Otherwise, noncontributory.  nad ncat Tm wnl B Stuffy nasal passages B OP wnl Neck supple, no LA rrr Ctab, no wheeze Ext w/o edema

## 2015-10-30 NOTE — Progress Notes (Signed)
Daily Session Note  Patient Details  Name: Christopher Santiago MRN: [id number] Date of Birth: 06-18-1931 Referring Provider:  Tonia Ghent, MD  Encounter Date: 10/30/2015  Check In:     Session Check In - 10/30/15 1044    Check-In   Staff Present Carson Myrtle, BS, RRT, Respiratory Therapist;Steven Way, BS, ACSM EP-C, Exercise Physiologist;Nonna Renninger Dillard Essex, MS, ACSM CEP, Exercise Physiologist   ER physicians immediately available to respond to emergencies [facility] immediately available ER MD   Physician(s) Clearnce Hasten and Joni Fears   Medication changes reported     No   Fall or balance concerns reported    No   Warm-up and Cool-down Performed on first and last piece of equipment   VAD Patient? No   Pain Assessment   Currently in Pain? No/denies   Multiple Pain Sites No         Goals Met:  Proper associated with RPD/PD & O2 Sat Independence with exercise equipment Using PLB without cueing & demonstrates good technique Exercise tolerated well Strength training completed today  Goals Unmet:  Not Applicable  Goals Comments: Patient completed exercise prescription and all exercise goals during rehab session. The exercise was tolerated well and the patient is progressing in the program. Personal and exercise goals expected to be met in 33 more sessions. Progress on specific individualized goals will be charted in patient's ITP. Upon completion of the program the patient will be comfortable managing exercise goals and progression on their own.    Dr. Emily Filbert is Medical Director for [place] and [facility].

## 2015-10-31 ENCOUNTER — Other Ambulatory Visit: Payer: Self-pay | Admitting: *Deleted

## 2015-10-31 DIAGNOSIS — R399 Unspecified symptoms and signs involving the genitourinary system: Secondary | ICD-10-CM | POA: Insufficient documentation

## 2015-10-31 MED ORDER — METOPROLOL TARTRATE 25 MG PO TABS: 25.0000 mg | ORAL_TABLET | Freq: Two times a day (BID) | ORAL | Status: DC

## 2015-10-31 MED ORDER — ALLOPURINOL 100 MG PO TABS: 100.0000 mg | ORAL_TABLET | Freq: Every day | ORAL | Status: DC

## 2015-10-31 NOTE — Assessment & Plan Note (Signed)
Presumed postnasal source.  Would try nasal atrovent and if not better, then refer to ENT.  He agrees.

## 2015-10-31 NOTE — Assessment & Plan Note (Signed)
Lightheaded, cut losartan to 12.'5mg'$  a day.

## 2015-10-31 NOTE — Assessment & Plan Note (Signed)
Restart flomax and update me as needed.  He agrees. >40 minutes spent in face to face time with patient, >50% spent in counselling or coordination of care

## 2015-10-31 NOTE — Assessment & Plan Note (Signed)
Reasonable for f/u CT chest to be done.  If no change, then we can d/c f/u CT per Dr. Grayland Ormond.

## 2015-11-01 ENCOUNTER — Encounter: Payer: Medicare Other | Admitting: *Deleted

## 2015-11-01 DIAGNOSIS — J449 Chronic obstructive pulmonary disease, unspecified: Secondary | ICD-10-CM | POA: Diagnosis not present

## 2015-11-01 NOTE — Progress Notes (Signed)
Daily Session Note  Patient Details  Name: Christopher Santiago MRN: [id number] Date of Birth: 1930-12-11 Referring Provider:  Minna Merritts, MD  Encounter Date: 11/01/2015  Check In:     Session Check In - 11/01/15 1033    Check-In   Staff Present Candiss Norse, MS, ACSM CEP, Exercise Physiologist;Stacey Blanch Media, RRT, RCP, Respiratory Therapist;Susanne Bice, RN, BSN, [place]   ER physicians immediately available to respond to emergencies [facility] immediately available ER MD   Physician(s) Marcelene Butte and Burlene Arnt   Medication changes reported     No   Fall or balance concerns reported    No   Warm-up and Cool-down Performed on first and last piece of equipment   VAD Patient? No   Pain Assessment   Currently in Pain? No/denies   Multiple Pain Sites No           Exercise Prescription Changes - 11/01/15 1000    Exercise Review   Progression Yes   Response to Exercise   Symptoms None   Comments Reviewed individualized exercise prescription and made increases per departmental policy. Exercise increases were discussed with the patient and they were able to perform the new work loads without issue (no signs or symptoms).    Duration Progress to 30 minutes of continuous aerobic without signs/symptoms of physical distress   Intensity Rest + 30   Progression Continue progressive overload as per policy without signs/symptoms or physical distress.   Resistance Training   Training Prescription Yes   Weight 4   Reps 10-12   Interval Training   Interval Training No   Recumbant Bike   Level 4   RPM 70   Minutes 12   Recumbant Elliptical   Level 3   RPM 60   Minutes 15   T5 Nustep   Level 2   Watts 25   Minutes 15      Goals Met:  Proper associated with RPD/PD & O2 Sat Independence with exercise equipment Using PLB without cueing & demonstrates good technique Exercise tolerated well Personal goals reviewed Strength training completed today  Goals Unmet:  Not  Applicable  Goals Comments: Patient completed exercise prescription and all exercise goals during rehab session. The exercise was tolerated well and the patient is progressing in the program. Personal and exercise goals expected to be met in 31 more sessions. Progress on specific individualized goals will be charted in patient's ITP. Upon completion of the program the patient will be comfortable managing exercise goals and progression on their own.    Dr. Emily Filbert is Medical Director for [place] and [facility].

## 2015-11-04 ENCOUNTER — Encounter: Payer: Medicare Other | Admitting: *Deleted

## 2015-11-04 DIAGNOSIS — M5136 Other intervertebral disc degeneration, lumbar region: Secondary | ICD-10-CM | POA: Diagnosis not present

## 2015-11-04 DIAGNOSIS — J449 Chronic obstructive pulmonary disease, unspecified: Secondary | ICD-10-CM | POA: Diagnosis not present

## 2015-11-04 DIAGNOSIS — M9903 Segmental and somatic dysfunction of lumbar region: Secondary | ICD-10-CM | POA: Diagnosis not present

## 2015-11-04 DIAGNOSIS — M62830 Muscle spasm of back: Secondary | ICD-10-CM | POA: Diagnosis not present

## 2015-11-04 DIAGNOSIS — M9901 Segmental and somatic dysfunction of cervical region: Secondary | ICD-10-CM | POA: Diagnosis not present

## 2015-11-04 NOTE — Progress Notes (Signed)
Daily Session Note  Patient Details  Name: Christopher Santiago MRN: [id number] Date of Birth: 1930/11/26 Referring Provider:  Tonia Ghent, MD  Encounter Date: 11/04/2015  Check In:     Session Check In - 11/04/15 1014    Check-In   Staff Present Carson Myrtle, BS, RRT, Respiratory Therapist;Steven Way, BS, ACSM EP-C, Exercise Physiologist;Santanna Olenik Amedeo Plenty, BS, ACSM CEP, Exercise Physiologist   ER physicians immediately available to respond to emergencies [facility] immediately available ER MD   Physician(s) Kerman Passey and Schaevitz   Medication changes reported     No   Fall or balance concerns reported    No   Warm-up and Cool-down Performed on first and last piece of equipment   VAD Patient? No   Pain Assessment   Currently in Pain? No/denies   Multiple Pain Sites No         Goals Met:  Proper associated with RPD/PD & O2 Sat Independence with exercise equipment Exercise tolerated well Strength training completed today  Goals Unmet:  Not Applicable  Goals Comments: Patient completed exercise prescription and all exercise goals during rehab session. The exercise was tolerated well and the patient is progressing in the program.     Dr. Emily Filbert is Medical Director for [place] and [facility].

## 2015-11-05 ENCOUNTER — Other Ambulatory Visit: Payer: Self-pay

## 2015-11-05 MED ORDER — METOPROLOL TARTRATE 25 MG PO TABS: 25.0000 mg | ORAL_TABLET | Freq: Two times a day (BID) | ORAL | Status: DC

## 2015-11-05 NOTE — Telephone Encounter (Signed)
[place] left v/m requesting refill 120 day of allopurinol and metoprolol; allopurinol already refilled for 120 days and spoke with Nicki at [facility] and she will delete 90 day refill on metoprolol. Refill done as requested.

## 2015-11-06 ENCOUNTER — Encounter: Payer: Medicare Other | Admitting: *Deleted

## 2015-11-06 DIAGNOSIS — J449 Chronic obstructive pulmonary disease, unspecified: Secondary | ICD-10-CM

## 2015-11-06 NOTE — Progress Notes (Signed)
Daily Session Note  Patient Details  Name: JOVONTA LEVIT MRN: [id number] Date of Birth: 1931-10-12 Referring Provider:  Minna Merritts, MD  Encounter Date: 11/06/2015  Check In:     Session Check In - 11/06/15 1028    Check-In   Staff Present Carson Myrtle, BS, RRT, Respiratory Therapist;Steven Way, BS, ACSM EP-C, Exercise Physiologist;Demetria Iwai Dillard Essex, MS, ACSM CEP, Exercise Physiologist   ER physicians immediately available to respond to emergencies [facility] immediately available ER MD   Physician(s) Jimmye Norman and Mariea Clonts   Medication changes reported     No   Fall or balance concerns reported    No   Warm-up and Cool-down Performed on first and last piece of equipment   VAD Patient? No   Pain Assessment   Currently in Pain? No/denies   Multiple Pain Sites No         Goals Met:  Proper associated with RPD/PD & O2 Sat Independence with exercise equipment Using PLB without cueing & demonstrates good technique Exercise tolerated well Strength training completed today  Goals Unmet:  Not Applicable  Goals Comments: Personal and exercise goals expected to be met in 29 more sessions. Progress on specific individualized goals will be charted in patient's ITP. Upon completion of the program the patient will be comfortable managing exercise goals and progression on their own.    Dr. Emily Filbert is Medical Director for [place] and [facility].

## 2015-11-07 ENCOUNTER — Ambulatory Visit
Admission: RE | Admit: 2015-11-07 | Discharge: 2015-11-07 | Disposition: A | Discharge status: Home / Self Care | Payer: Medicare Other | Source: Ambulatory Visit | Attending: Family Medicine | Admitting: Family Medicine

## 2015-11-07 DIAGNOSIS — I77810 Thoracic aortic ectasia: Secondary | ICD-10-CM | POA: Diagnosis not present

## 2015-11-07 DIAGNOSIS — I2510 Atherosclerotic heart disease of native coronary artery without angina pectoris: Secondary | ICD-10-CM | POA: Diagnosis not present

## 2015-11-07 DIAGNOSIS — R911 Solitary pulmonary nodule: Secondary | ICD-10-CM | POA: Diagnosis not present

## 2015-11-07 DIAGNOSIS — D3500 Benign neoplasm of unspecified adrenal gland: Secondary | ICD-10-CM | POA: Insufficient documentation

## 2015-11-07 DIAGNOSIS — R05 Cough: Secondary | ICD-10-CM | POA: Insufficient documentation

## 2015-11-07 DIAGNOSIS — Z87891 Personal history of nicotine dependence: Secondary | ICD-10-CM | POA: Insufficient documentation

## 2015-11-07 DIAGNOSIS — R0602 Shortness of breath: Secondary | ICD-10-CM | POA: Diagnosis not present

## 2015-11-08 ENCOUNTER — Encounter: Payer: Medicare Other | Admitting: *Deleted

## 2015-11-08 DIAGNOSIS — J449 Chronic obstructive pulmonary disease, unspecified: Secondary | ICD-10-CM

## 2015-11-08 NOTE — Progress Notes (Signed)
Daily Session Note  Patient Details  Name: Christopher Santiago MRN: [id number] Date of Birth: 14-Mar-1931 Referring Provider:  Minna Merritts, MD  Encounter Date: 11/08/2015  Check In:     Session Check In - 11/08/15 1208    Check-In   Staff Present Candiss Norse, MS, ACSM CEP, Exercise Physiologist;Mary Kellie Shropshire, RN;Stacey Blanch Media, RRT, RCP, Respiratory Therapist   ER physicians immediately available to respond to emergencies [facility] immediately available ER MD   Physician(s) Jimmye Norman and Mariea Clonts   Medication changes reported     No   Fall or balance concerns reported    No   Warm-up and Cool-down Performed on first and last piece of equipment   VAD Patient? No   Pain Assessment   Currently in Pain? No/denies   Multiple Pain Sites No           Exercise Prescription Changes - 11/08/15 1200    Exercise Review   Progression Yes   Response to Exercise   Symptoms None   Comments Evaluated Berthel's resistive training prescription and increased his weight to 5 lbs.    Duration Progress to 30 minutes of continuous aerobic without signs/symptoms of physical distress   Intensity Rest + 30   Progression Continue progressive overload as per policy without signs/symptoms or physical distress.   Resistance Training   Training Prescription Yes   Weight 5   Reps 10-12   Interval Training   Interval Training No   Recumbant Bike   Level 4   RPM 70   Minutes 12   Recumbant Elliptical   Level 5   RPM 60   Minutes 15   T5 Nustep   Level 2   Watts 25   Minutes 15      Goals Met:  Proper associated with RPD/PD & O2 Sat Independence with exercise equipment Using PLB without cueing & demonstrates good technique Exercise tolerated well Personal goals reviewed Strength training completed today  Goals Unmet:  Not Applicable  Goals Comments: Patient completed exercise prescription and all exercise goals during rehab session. The exercise was tolerated well and the patient  is progressing in the program. Personal and exercise goals expected to be met in 28 more sessions. Progress on specific individualized goals will be charted in patient's ITP. Upon completion of the program the patient will be comfortable managing exercise goals and progression on their own.    Dr. Emily Filbert is Medical Director for [place] and [facility].

## 2015-11-11 ENCOUNTER — Encounter: Payer: Medicare Other | Admitting: *Deleted

## 2015-11-11 DIAGNOSIS — J449 Chronic obstructive pulmonary disease, unspecified: Secondary | ICD-10-CM | POA: Diagnosis not present

## 2015-11-11 NOTE — Progress Notes (Signed)
Daily Session Note  Patient Details  Name: Christopher Santiago MRN: [id number] Date of Birth: 25-Dec-1930 Referring Provider:  Minna Merritts, MD  Encounter Date: 11/11/2015  Check In:     Session Check In - 11/11/15 1034    Check-In   Staff Present Carson Myrtle, BS, RRT, Respiratory Bertis Ruddy, BS, ACSM CEP, Exercise Physiologist;Steven Way, BS, ACSM EP-C, Exercise Physiologist   ER physicians immediately available to respond to emergencies [facility] immediately available ER MD   Physician(s) Marcelene Butte and Schaevit   Medication changes reported     No   Fall or balance concerns reported    No   Warm-up and Cool-down Performed on first and last piece of equipment   VAD Patient? No   Pain Assessment   Currently in Pain? No/denies   Multiple Pain Sites No           Exercise Prescription Changes - 11/11/15 1000    Exercise Review   Progression Yes   Response to Exercise   Symptoms None   Comments Discussed increases on RB and T5 with patient. New intensities were tolerated well with no signs or symptoms.    Duration Progress to 30 minutes of continuous aerobic without signs/symptoms of physical distress   Intensity Rest + 30   Progression Continue progressive overload as per policy without signs/symptoms or physical distress.   Resistance Training   Training Prescription Yes   Weight 5   Reps 10-12   Interval Training   Interval Training No   Recumbant Bike   Level 5   RPM 70   Minutes 12   Recumbant Elliptical   Level 5   RPM 60   Minutes 15   T5 Nustep   Level 3   Watts 30   Minutes 15      Goals Met:  Proper associated with RPD/PD & O2 Sat Independence with exercise equipment Exercise tolerated well Personal goals reviewed Strength training completed today  Goals Unmet:  Not Applicable  Goals Comments: Reviewed individualized exercise prescription and made increases per departmental policy. Exercise increases were discussed with the patient  and they were able to perform the new work loads without issue (no signs or symptoms).    Dr. Emily Filbert is Medical Director for [place] and [facility].

## 2015-11-13 ENCOUNTER — Encounter: Payer: Medicare Other | Admitting: *Deleted

## 2015-11-13 DIAGNOSIS — J449 Chronic obstructive pulmonary disease, unspecified: Secondary | ICD-10-CM

## 2015-11-13 NOTE — Progress Notes (Signed)
Daily Session Note  Patient Details  Name: Christopher Santiago MRN: [id number] Date of Birth: Mar 29, 1931 Referring Provider:  Tonia Ghent, MD  Encounter Date: 11/13/2015  Check In:     Session Check In - 11/13/15 1008    Check-In   Staff Present Carson Myrtle, BS, RRT, Respiratory Therapist;Steven Way, BS, ACSM EP-C, Exercise Physiologist;Echo [place]sbrook Dillard Essex, MS, ACSM CEP, Exercise Physiologist   ER physicians immediately available to respond to emergencies [facility] immediately available ER MD   Physician(s) Edd Fabian and Jimmye Norman   Medication changes reported     No   Fall or balance concerns reported    No   Warm-up and Cool-down Performed on first and last piece of equipment   VAD Patient? No   Pain Assessment   Currently in Pain? No/denies   Multiple Pain Sites No         Goals Met:  Proper associated with RPD/PD & O2 Sat Independence with exercise equipment Using PLB without cueing & demonstrates good technique Exercise tolerated well Strength training completed today  Goals Unmet:  Not Applicable  Goals Comments: Patient completed exercise prescription and all exercise goals during rehab session. The exercise was tolerated well and the patient is progressing in the program.    Dr. Emily Filbert is Medical Director for [place] and [facility].

## 2015-11-15 ENCOUNTER — Encounter: Payer: Medicare Other | Admitting: *Deleted

## 2015-11-15 DIAGNOSIS — J449 Chronic obstructive pulmonary disease, unspecified: Secondary | ICD-10-CM

## 2015-11-15 NOTE — Progress Notes (Signed)
Daily Session Note  Patient Details  Name: UMBERTO PAVEK MRN: [id number] Date of Birth: [birth year] Referring Provider:  Tonia Ghent, MD  Encounter Date: 11/15/2015  Check In:     Session Check In - 11/15/15 1131    Check-In   Staff Present Nyoka Cowden, RN;Stacey Blanch Media, RRT, RCP, Respiratory Therapist;Kariana Wiles, RN, BSN, [place]   ER physicians immediately available to respond to emergencies See telemetry face sheet for immediately available ER MD   Medication changes reported     No   Fall or balance concerns reported    No   Warm-up and Cool-down Performed on first and last piece of equipment   VAD Patient? No   Pain Assessment   Currently in Pain? No/denies         Goals Met:  Exercise tolerated well Strength training completed today  Goals Unmet:  Not Applicable  Goals Comments: Doing well with exercise prescription progression.  Expected to meet program goals in 26 days.    Dr. Emily Filbert is Medical Director for [place] and [facility].

## 2015-11-19 ENCOUNTER — Encounter: Payer: Self-pay | Admitting: Respiratory Therapy

## 2015-11-19 DIAGNOSIS — M5136 Other intervertebral disc degeneration, lumbar region: Secondary | ICD-10-CM | POA: Diagnosis not present

## 2015-11-19 DIAGNOSIS — M9903 Segmental and somatic dysfunction of lumbar region: Secondary | ICD-10-CM | POA: Diagnosis not present

## 2015-11-19 DIAGNOSIS — M62830 Muscle spasm of back: Secondary | ICD-10-CM | POA: Diagnosis not present

## 2015-11-19 DIAGNOSIS — M9901 Segmental and somatic dysfunction of cervical region: Secondary | ICD-10-CM | POA: Diagnosis not present

## 2015-11-19 DIAGNOSIS — J449 Chronic obstructive pulmonary disease, unspecified: Secondary | ICD-10-CM

## 2015-11-19 NOTE — Progress Notes (Signed)
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 before increasing intensity.  Mr remedios wants to improve his stamina. He wants to return to the [employer]  for his workouts, and continue camping, church activites, and activities  with his wife.   Understand more about Heart/Pulmonary Disease. Yes   Intervention While in program utilize professionals for any questions, and attend  the education sessions. Great websites to use are www.americanheart.org or www.lung.org for reliable information.  Mr Dierks  has had COPD for many years, but he states he does not know much about it and is interested in learning more. Mr Kerschner has OSA and wears 10cmH2O pressure with nasal mask  through [place].   Improve shortness of breath with ADL's Yes   Intervention While in program, learn and follow the exercise prescription taught. Start at a low level workload and increase workload ad advised by the exercise physiologist. Increase time before increasing intensity.  Mr Hilgers does want to improve his breathing and have more confidence in managing it with out fear.   Develop more efficient breathing techniques such as purse lipped breathing and diaphragmatic breathing; and practicing self-pacing with activity Yes   Intervention --  Instructed Mr Levi in PLB and DB - good technique.   Increase knowledge of respiratory medications and ability to use respiratory devices properly.  Yes   Intervention While in program, learn to administer MDI, nebulizer, and spacer properly.;Learn to take respiratory medicine as ordered.;While in program, learn to Clean MDI, nebulizers, and spacers properly.  Mr cawley was instructed on Spiriva and Ventolin MDIs. A spacer was given to him for use with the Ventolin with instruction.      Personal Goals and Risk Factors Review:      Goals and Risk Factor Review      11/01/15 1501 11/04/15 1000 11/06/15 1042 11/11/15 1039     Weight Management   Comments   --  Siddh is aware that he is overweight.  HE wants to concentrate on his activity and improving his stamina while in the program. HE does not want to focus on weight loss at this time. HE is aware that the exercise may benefit towards weight loss.     Increase Aerobic Exercise and Physical Activity   Goals Progress/Improvement seen     Yes    Comments    Patient has increased his exericse  intensities since starting the program (see exercise changes) and has tolerated the increases well. He stated that he feels stronger and he is now able to walk from the elevator to class, which he used to not be able to do. He stated that his SOB is him most limiting factor in what he is able to do, and that he has not seen much change in SOB yet.     Understand more about Heart/Pulmonary Disease   Goals Progress/Improvement seen   Yes      Comments  Educated on COPD - reviewed Spirometry and results; recommended Mr Lappe get a complete PFT for DLCO and TLC; educated on importance of exercise and using his inhalers to slow COPD progression.      Improve shortness of breath with ADL's   Goals Progress/Improvement seen  No       Comments Mr. Lowella Grip is on session 5 of 71.  He has been exercising before he started [facility] and cannot see any improvement, yet in his SOB.       Breathing Techniques   Goals Progress/Improvement seen  Yes       Comments Mr. Ashmead demonstrated proper technique of PLB while on the equipment in [facility].       Increase knowledge of respiratory medications   Goals Progress/Improvement seen  Yes       Comments Mr. Burzynski has been given a spacer for his MDIs to use at home.  He has been started on Flonase for sinus drainage.  Personal Goals Discharge (Final Personal Goals and Risk Factors Review):      Goals and Risk Factor Review - 11/11/15 1039    Increase Aerobic Exercise and Physical Activity   Goals Progress/Improvement seen  Yes   Comments Patient has increased his exericse intensities since starting the program (see exercise changes) and has tolerated the increases well. He stated that he feels stronger and he is now able to walk from the elevator to class, which he used to not be able to do. He stated that his SOB is him most limiting factor in what he is able to do, and that he has not seen much change in SOB yet.       ITP Comments:     ITP  Comments      10/30/15 1000 11/04/15 1016 11/11/15 1035 11/13/15 1000 11/13/15 1011   ITP Comments Estimated timetable to achieve ITP goals is 32 sessions Estimated timetable to achieve ITP goals is 30 sessions.  Estimated timetable to achieve ITP goals is 27 sessions. Mr Turnbough was given our Strength Booklet with exercises to do at home with his weights.  Personal and exercise goals expected to be met in 26 more sessions. Progress on specific individualized goals will be charted in patient's ITP. Upon completion of the program the patient will be comfortable managing exercise goals and progression on their own.      11/15/15 1357           ITP Comments Goals are expected to be met in 25 more sessions.  Mr. Castorena says that he is tired today and is unable to do as much as usual.  He does not feel sick, he seems to think that it is due to the cold weather and preparing for Christmas.          Comments: 30 day note review

## 2015-11-20 ENCOUNTER — Other Ambulatory Visit: Payer: Self-pay | Admitting: *Deleted

## 2015-11-20 ENCOUNTER — Telehealth: Payer: Self-pay | Admitting: Respiratory Therapy

## 2015-11-20 MED ORDER — TIOTROPIUM BROMIDE MONOHYDRATE 18 MCG IN CAPS: ORAL_CAPSULE | RESPIRATORY_TRACT | Status: DC

## 2015-11-20 NOTE — Telephone Encounter (Signed)
Mr Oatis called and will be out today - his wife is sick, and he will stay home with her today and possibly Friday.

## 2015-11-22 ENCOUNTER — Encounter: Payer: Medicare Other | Admitting: *Deleted

## 2015-11-22 DIAGNOSIS — J449 Chronic obstructive pulmonary disease, unspecified: Secondary | ICD-10-CM

## 2015-11-22 NOTE — Progress Notes (Signed)
Daily Session Note  Patient Details  Name: Christopher Santiago MRN: [id number] Date of Birth: 1931-02-28 Referring Provider:  Minna Merritts, MD  Encounter Date: 11/22/2015  Check In:     Session Check In - 11/22/15 1216    Check-In   Staff Present Frederich Cha, RRT, RCP, Respiratory Therapist;Sheryn Aldaz Dillard Essex, MS, ACSM CEP, Exercise Physiologist;Susanne Bice, RN, BSN, [place]   ER physicians immediately available to respond to emergencies [facility] immediately available ER MD   Physician(s) Joni Fears and Reita Cliche   Medication changes reported     No   Fall or balance concerns reported    No   Warm-up and Cool-down Performed on first and last piece of equipment   VAD Patient? No   Pain Assessment   Currently in Pain? No/denies   Multiple Pain Sites No         Goals Met:  Proper associated with RPD/PD & O2 Sat Independence with exercise equipment Using PLB without cueing & demonstrates good technique Exercise tolerated well Strength training completed today  Goals Unmet:  Not Applicable  Goals Comments: Patient completed exercise prescription and all exercise goals during rehab session. The exercise was tolerated well and the patient is progressing in the program.    Dr. Emily Filbert is Medical Director for [place] and [facility].

## 2015-11-25 DIAGNOSIS — M9903 Segmental and somatic dysfunction of lumbar region: Secondary | ICD-10-CM | POA: Diagnosis not present

## 2015-11-25 DIAGNOSIS — M5136 Other intervertebral disc degeneration, lumbar region: Secondary | ICD-10-CM | POA: Diagnosis not present

## 2015-11-25 DIAGNOSIS — M9901 Segmental and somatic dysfunction of cervical region: Secondary | ICD-10-CM | POA: Diagnosis not present

## 2015-11-25 DIAGNOSIS — M62830 Muscle spasm of back: Secondary | ICD-10-CM | POA: Diagnosis not present

## 2015-11-27 ENCOUNTER — Encounter: Payer: Medicare Other | Attending: Cardiovascular Disease

## 2015-11-27 DIAGNOSIS — J449 Chronic obstructive pulmonary disease, unspecified: Secondary | ICD-10-CM | POA: Insufficient documentation

## 2015-11-27 NOTE — Progress Notes (Signed)
Daily Session Note  Patient Details  Name: Christopher Santiago MRN: [id number] Date of Birth: 1931/10/26 Referring Provider:  Minna Merritts, MD  Encounter Date: 11/27/2015  Check In:     Session Check In - 11/27/15 1101    Check-In   Staff Present Candiss Norse, MS, ACSM CEP, Exercise Physiologist;Laureen Owens Shark, BS, RRT, Respiratory Therapist;Steven Way, BS, ACSM EP-C, Exercise Physiologist   ER physicians immediately available to respond to emergencies [facility] immediately available ER MD   Physician(s) Corky Downs and Cinda Quest   Medication changes reported     No   Fall or balance concerns reported    No   Warm-up and Cool-down Performed on first and last piece of equipment   VAD Patient? No   Pain Assessment   Currently in Pain? No/denies   Multiple Pain Sites No           Exercise Prescription Changes - 11/27/15 1100    Exercise Review   Progression Yes   Response to Exercise   Symptoms None   Comments Increased [place] on the bike due to increase in his leg strength and stamina. He likes to feel continually challenged and says the progression is appropriate.   Duration Progress to 30 minutes of continuous aerobic without signs/symptoms of physical distress   Intensity Rest + 30   Progression Continue progressive overload as per policy without signs/symptoms or physical distress.   Resistance Training   Training Prescription Yes   Weight 5   Reps 10-12   Interval Training   Interval Training No   Recumbant Bike   Level 6   RPM 65   Minutes 15   Recumbant Elliptical   Level 5   RPM 40   Minutes 15   T5 Nustep   Level 3   Watts 30   Minutes 15      Goals Met:  Proper associated with RPD/PD & O2 Sat Independence with exercise equipment Using PLB without cueing & demonstrates good technique Exercise tolerated well Personal goals reviewed Strength training completed today  Goals Unmet:  Not Applicable  Goals Comments: Patient completed exercise  prescription and all exercise goals during rehab session. The exercise was tolerated well and the patient is progressing in the program.    Dr. Emily Filbert is Medical Director for [place] and [facility].

## 2015-11-28 ENCOUNTER — Encounter: Payer: Self-pay | Admitting: Internal Medicine

## 2015-11-29 ENCOUNTER — Encounter: Payer: Medicare Other | Admitting: *Deleted

## 2015-11-29 DIAGNOSIS — J449 Chronic obstructive pulmonary disease, unspecified: Secondary | ICD-10-CM

## 2015-11-29 NOTE — Progress Notes (Signed)
Daily Session Note  Patient Details  Name: Christopher Santiago MRN: [id number] Date of Birth: 08-12-1931 Referring Provider:  Minna Merritts, MD  Encounter Date: 11/29/2015  Check In:     Session Check In - 11/29/15 1043    Check-In   Staff Present Frederich Cha, RRT, RCP, Respiratory Therapist;Elliett Guarisco Dillard [place], MS, ACSM CEP, Exercise Physiologist;Susanne Bice, RN, BSN, [place]   ER physicians immediately available to respond to emergencies [facility] immediately available ER MD   Physician(s) Edd Fabian and Thomasene Lot   Medication changes reported     No   Fall or balance concerns reported    No   Warm-up and Cool-down Performed on first and last piece of equipment   VAD Patient? No   Pain Assessment   Currently in Pain? No/denies   Multiple Pain Sites No           Exercise Prescription Changes - 11/29/15 1000    Exercise Review   Progression Yes   Response to Exercise   Symptoms None   Comments [name] continues to show progression with each visit. He is very motivated and stays on top of exercise increases. This is reflected in an updated ex. rx. and he was able to complete it with no signs or symptoms.   Duration Progress to 30 minutes of continuous aerobic without signs/symptoms of physical distress   Intensity Rest + 30   Progression Continue progressive overload as per policy without signs/symptoms or physical distress.   Resistance Training   Training Prescription Yes   Weight 5   Reps 10-12   Interval Training   Interval Training No   Recumbant Bike   Level 6   RPM 65   Minutes 15   Recumbant Elliptical   Level 5   RPM 40   Minutes 15   T5 Nustep   Level 6   Watts 30   Minutes 15      Goals Met:  Proper associated with RPD/PD & O2 Sat Independence with exercise equipment Using PLB without cueing & demonstrates good technique Exercise tolerated well Personal goals reviewed Strength training completed today  Goals Unmet:  Not Applicable  Goals Comments:  Patient completed exercise prescription and all exercise goals during rehab session. The exercise was tolerated well and the patient is progressing in the program.    Dr. Emily Filbert is Medical Director for [place] and [facility].

## 2015-12-02 ENCOUNTER — Other Ambulatory Visit: Payer: Self-pay | Admitting: Cardiovascular Disease

## 2015-12-03 ENCOUNTER — Other Ambulatory Visit: Payer: Self-pay | Admitting: *Deleted

## 2015-12-03 MED ORDER — CITALOPRAM HYDROBROMIDE 40 MG PO TABS: 60.0000 mg | ORAL_TABLET | Freq: Every day | ORAL | Status: DC

## 2015-12-04 ENCOUNTER — Encounter: Payer: Medicare Other | Admitting: *Deleted

## 2015-12-04 DIAGNOSIS — J449 Chronic obstructive pulmonary disease, unspecified: Secondary | ICD-10-CM | POA: Diagnosis not present

## 2015-12-04 NOTE — Progress Notes (Signed)
Daily Session Note  Patient Details  Name: Christopher Santiago MRN: [id number] Date of Birth: Apr 13, 1931 Referring Provider:  Minna Merritts, MD  Encounter Date: 12/04/2015  Check In:     Session Check In - 12/04/15 1032    Check-In   Staff Present Carson Myrtle, BS, RRT, Respiratory Therapist;Steven Way, BS, ACSM EP-C, Exercise Physiologist;Amaranta Mehl Dillard [place], MS, ACSM CEP, Exercise Physiologist   ER physicians immediately available to respond to emergencies [facility] immediately available ER MD   Physician(s) Clearnce Hasten and Paduchowski    Medication changes reported     No   Fall or balance concerns reported    No   Warm-up and Cool-down Performed on first and last piece of equipment   VAD Patient? No   Pain Assessment   Currently in Pain? No/denies   Multiple Pain Sites No         Goals Met:  Proper associated with RPD/PD & O2 Sat Independence with exercise equipment Using PLB without cueing & demonstrates good technique Exercise tolerated well Strength training completed today  Goals Unmet:  Not Applicable  Goals Comments: Patient completed exercise prescription and all exercise goals during rehab session. The exercise was tolerated well and the patient is progressing in the program.    Dr. Emily Filbert is Medical Director for [place] and [facility].

## 2015-12-06 ENCOUNTER — Encounter: Payer: Medicare Other | Admitting: *Deleted

## 2015-12-06 DIAGNOSIS — J449 Chronic obstructive pulmonary disease, unspecified: Secondary | ICD-10-CM

## 2015-12-06 NOTE — Progress Notes (Signed)
Daily Session Note  Patient Details  Name: Christopher Santiago MRN: [id number] Date of Birth: [birth year] Referring Provider:  Minna Merritts, MD  Encounter Date: 12/06/2015  Check In:     Session Check In - 12/06/15 1104    Check-In   Staff Present Frederich Cha, RRT, RCP, Respiratory Therapist;Bharath Bernstein Dillard [place], MS, ACSM CEP, Exercise Physiologist;Susanne Bice, RN, BSN, [place]   ER physicians immediately available to respond to emergencies [facility] immediately available ER MD   Physician(s) Jacqualine Code and Joni Fears   Medication changes reported     No   Fall or balance concerns reported    No   Warm-up and Cool-down Performed on first and last piece of equipment   VAD Patient? No   Pain Assessment   Currently in Pain? No/denies   Multiple Pain Sites No         Goals Met:  Personal goals reviewed  Goals Unmet:  [name] met with the dietician today and did not have time to exercise after his meeting with her.   Goals Comments: No exercise today, consult with the dietician only   Dr. Emily Filbert is Medical Director for [place] and [facility].

## 2015-12-09 ENCOUNTER — Encounter: Payer: Self-pay | Admitting: Family Medicine

## 2015-12-09 ENCOUNTER — Other Ambulatory Visit: Payer: Self-pay | Admitting: Family Medicine

## 2015-12-09 ENCOUNTER — Encounter: Payer: Medicare Other | Admitting: *Deleted

## 2015-12-09 DIAGNOSIS — M9901 Segmental and somatic dysfunction of cervical region: Secondary | ICD-10-CM | POA: Diagnosis not present

## 2015-12-09 DIAGNOSIS — R05 Cough: Secondary | ICD-10-CM

## 2015-12-09 DIAGNOSIS — M5136 Other intervertebral disc degeneration, lumbar region: Secondary | ICD-10-CM | POA: Diagnosis not present

## 2015-12-09 DIAGNOSIS — R059 Cough, unspecified: Secondary | ICD-10-CM

## 2015-12-09 DIAGNOSIS — J449 Chronic obstructive pulmonary disease, unspecified: Secondary | ICD-10-CM | POA: Diagnosis not present

## 2015-12-09 DIAGNOSIS — M62830 Muscle spasm of back: Secondary | ICD-10-CM | POA: Diagnosis not present

## 2015-12-09 DIAGNOSIS — M9903 Segmental and somatic dysfunction of lumbar region: Secondary | ICD-10-CM | POA: Diagnosis not present

## 2015-12-09 NOTE — Progress Notes (Signed)
Daily Session Note  Patient Details  Name: SHAWN CARATTINI MRN: [id number] Date of Birth: 1931/02/26 Referring Provider:  Minna Merritts, MD  Encounter Date: 12/09/2015  Check In:     Session Check In - 12/09/15 1100    Check-In   Staff Present Earlean Shawl, BS, ACSM CEP, Exercise Physiologist;Steven Way, BS, ACSM EP-C, Exercise Physiologist;Laureen Owens Shark, BS, RRT, Respiratory Therapist   ER physicians immediately available to respond to emergencies [facility] immediately available ER MD   Physician(s) Dr. Clearnce Hasten and Marcelene Butte   Medication changes reported     No   Fall or balance concerns reported    No   Warm-up and Cool-down Performed on first and last piece of equipment   VAD Patient? No   Pain Assessment   Currently in Pain? No/denies   Multiple Pain Sites No         Goals Met:  Proper associated with RPD/PD & O2 Sat Independence with exercise equipment Exercise tolerated well Strength training completed today  Goals Unmet:  Not Applicable  Goals Comments: Patient completed exercise prescription and all exercise goals during rehab session. The exercise was tolerated well and the patient is progressing in the program.     Dr. Emily Filbert is Medical Director for [place] and [facility].

## 2015-12-11 ENCOUNTER — Telehealth: Payer: Self-pay | Admitting: Respiratory Therapy

## 2015-12-11 NOTE — Telephone Encounter (Signed)
Christopher Santiago called and is not feeling well, so he will not attended [facility] today.

## 2015-12-13 ENCOUNTER — Encounter: Payer: Medicare Other | Admitting: *Deleted

## 2015-12-13 DIAGNOSIS — J449 Chronic obstructive pulmonary disease, unspecified: Secondary | ICD-10-CM

## 2015-12-13 NOTE — Progress Notes (Signed)
Daily Session Note  Patient Details  Name: Christopher Santiago MRN: [id number] Date of Birth: 18-Jan-1931 Referring Provider:  Minna Merritts, MD  Encounter Date: 12/13/2015  Check In:     Session Check In - 12/13/15 1033    Check-In   Staff Present Frederich Cha, RRT, RCP, Respiratory Therapist;Susanne Bice, RN, BSN, CCRP;Claus Silvestro Dillard [place], MS, ACSM CEP, Exercise Physiologist   ER physicians immediately available to respond to emergencies [facility] immediately available ER MD   Physician(s) Clearnce Hasten and Quale   Medication changes reported     No   Fall or balance concerns reported    No   Warm-up and Cool-down Performed on first and last piece of equipment   VAD Patient? No   Pain Assessment   Currently in Pain? No/denies   Multiple Pain Sites No         Goals Met:  Proper associated with RPD/PD & O2 Sat Independence with exercise equipment Using PLB without cueing & demonstrates good technique Exercise tolerated well Strength training completed today  Goals Unmet:  Not Applicable  Goals Comments: Patient completed exercise prescription and all exercise goals during rehab session. The exercise was tolerated well and the patient is progressing in the program.    Dr. Emily Filbert is Medical Director for [place] and [facility].

## 2015-12-16 ENCOUNTER — Encounter: Payer: Medicare Other | Admitting: *Deleted

## 2015-12-16 DIAGNOSIS — J449 Chronic obstructive pulmonary disease, unspecified: Secondary | ICD-10-CM

## 2015-12-16 DIAGNOSIS — R490 Dysphonia: Secondary | ICD-10-CM | POA: Diagnosis not present

## 2015-12-16 NOTE — Progress Notes (Signed)
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 written instruction to review new updates, new respiratory medications, new advancements in procedures and treatments. Provide informative websites  and "800" numbers of self-education.      Pulmonary Rehab from 12/13/2015 in [facility] Cardiac and Pulmonary Rehab   Date  11/01/15 [facility] Your Numbers]   Educator  CE   Instruction Review Code  2- meets goals/outcomes      Lung Procedures: - Group verbal and written instruction to describe testing methods done to diagnose lung disease. Review the outcome of test results. Describe the treatment choices: Pulmonary Function Tests, ABGs and oximetry.   Energy Conservation: - Provide group verbal and written instruction for methods to conserve energy, plan and organize activities. Instruct on pacing techniques, use of adaptive equipment and posture/positioning to relieve shortness of breath.   Triggers: - Group verbal and written instruction to review types of  environmental controls: home humidity, furnaces, filters, dust mite/pet prevention, HEPA vacuums. To discuss weather changes, air quality and the benefits of nasal washing.      Pulmonary Rehab from 12/13/2015 in [facility] Cardiac and Pulmonary Rehab   Date  12/09/15   Educator  LB   Instruction Review Code  2- meets goals/outcomes      Exacerbations: - Group verbal and written instruction to provide: warning signs, infection symptoms, calling MD promptly, preventive modes, and value of vaccinations. Review: effective airway clearance, coughing and/or vibration techniques. Create an Sports administrator.      Pulmonary Rehab from 12/13/2015 in [facility] Cardiac and Pulmonary Rehab   Date  11/06/15   Educator  LB   Instruction Review Code  2- meets goals/outcomes      Oxygen: - Individual and group verbal and written instruction on oxygen therapy. Includes supplement oxygen, available portable oxygen systems, continuous and intermittent flow rates, oxygen safety, concentrators, and Medicare reimbursement for oxygen.   Respiratory Medications: - Group verbal and written instruction to review medications for lung disease. Drug class, frequency, complications, importance of spacers, rinsing mouth after steroid MDI's, and proper cleaning methods for nebulizers.      Pulmonary Rehab from 12/13/2015 in [facility] Cardiac and Pulmonary Rehab   Date  10/22/15   Educator  LB      AED/CPR: - Group verbal and written instruction with the use of models to demonstrate the basic use of the AED with the basic ABC's of resuscitation.   Breathing Retraining: - Provides individuals verbal and written instruction on purpose, frequency, and proper technique of diaphragmatic breathing and pursed-lipped breathing. Applies individual practice skills.      Pulmonary Rehab from 12/13/2015 in [facility] Cardiac and Pulmonary Rehab   Date  10/25/15   Educator  [place]   Instruction Review Code  2- meets goals/outcomes      Anatomy and  Physiology of the Lungs: - Group verbal and written instruction with the use of models to provide basic lung anatomy and physiology related to function, structure and complications of lung disease.      Pulmonary Rehab from 12/13/2015 in [facility] Cardiac and Pulmonary Rehab   Date  11/22/15   Educator  [place]   Instruction Review Code  2- meets goals/outcomes      Heart Failure: - Group verbal and written instruction on the basics of heart failure: signs/symptoms, treatments, explanation of ejection fraction, enlarged heart and cardiomyopathy.      Pulmonary Rehab from 12/13/2015 in [facility] Cardiac and Pulmonary Rehab   Date  12/13/15   Educator  [place]   Instruction Review Code  2- meets goals/outcomes      Sleep Apnea: - Individual verbal and written instruction to review Obstructive Sleep  Apnea. Review of risk factors, methods for diagnosing and types of masks and machines for OSA.   Anxiety: - Provides group, verbal and written instruction on the correlation between heart/lung disease and anxiety, treatment options, and management of anxiety.   Relaxation: - Provides group, verbal and written instruction about the benefits of relaxation for patients with heart/lung disease. Also provides patients with examples of relaxation techniques.   Knowledge Questionnaire Score:     Knowledge Questionnaire Score - 10/22/15 0900    Knowledge Questionnaire Score   Pre Score -4      Personal Goals and Risk Factors at Admission:     Personal Goals and Risk Factors at Admission - 10/22/15 0900    Personal Goals and Risk Factors on Admission   Increase Aerobic Exercise and Physical Activity Yes   Intervention While in program, learn and follow the exercise prescription taught. Start at a low level workload and increase workload after able to maintain previous level for 30 minutes. Increase time before increasing intensity.  Mr unrein wants to improve his stamina. He wants to return to the [employer]   for his workouts, and continue camping, church activites, and activities  with his wife.   Understand more about Heart/Pulmonary Disease. Yes   Intervention While in program utilize professionals for any questions, and attend the education sessions. Great websites to use are www.americanheart.org or www.lung.org for reliable information.  Mr Lumm has had COPD for many years, but he states he does not know much about it and is interested in learning more. Mr Shader has OSA and wears 10cmH2O pressure with nasal mask  through [place].   Improve shortness of breath with ADL's Yes   Intervention While in program, learn and follow the exercise prescription taught. Start at a low level workload and increase workload ad advised by the exercise physiologist. Increase time before increasing intensity.  Mr Castellana does want to improve his breathing and have more confidence in managing it with out fear.   Develop more efficient breathing techniques such as purse lipped breathing and diaphragmatic breathing; and practicing self-pacing with activity Yes   Intervention --  Instructed Mr Hoselton in PLB and DB - good technique.   Increase knowledge of respiratory medications and ability to use respiratory devices properly.  Yes   Intervention While in program, learn to administer MDI, nebulizer, and spacer properly.;Learn to take respiratory medicine as ordered.;While in program, learn to Clean MDI, nebulizers, and spacers properly.  Mr mcadory was instructed on Spiriva and Ventolin MDIs. A spacer was given to him for use with the Ventolin with instruction.      Personal Goals and Risk Factors Review:      Goals and Risk Factor Review      11/01/15 1501 11/04/15 1000 11/06/15 1042 11/11/15 1039 11/22/15 1247   Weight Management   Comments   --  Amr is aware that he is overweight.  HE wants to concentrate on his activity and improving his stamina while in the program. HE does not want to focus on  weight loss at this time. HE is aware that the exercise may benefit towards weight loss.  Mell has a BMI greater than 30. I reviewed this with him. We discussed opportunity for resouces to help him work on decreasing his BMI. Mainly today we focused on nutriton and exercise as his resources. I will contact the RD to set up a nutrition counseling appointment with [facility]. He will complete the Rate Your  PLate and Food Diary and bring it back next week.  After he mets with the RD we will look at the goals and focus on working on one or two goals will he remains in the program and for discahrge.    Increase Aerobic Exercise and Physical Activity   Goals Progress/Improvement seen     Yes    Comments    Patient has increased his exericse intensities since starting the program (see exercise changes) and has tolerated the increases well. He stated that he feels stronger and he is now able to walk from the elevator to class, which he used to not be able to do. He stated that his SOB is him most limiting factor in what he is able to do, and that he has not seen much change in SOB yet.     Understand more about Heart/Pulmonary Disease   Goals Progress/Improvement seen   Yes      Comments  Educated on COPD - reviewed Spirometry and results; recommended Mr Greenley get a complete PFT for DLCO and TLC; educated on importance of exercise and using his inhalers to slow COPD progression.      Improve shortness of breath with ADL's   Goals Progress/Improvement seen  No       Comments Mr. Lowella Grip is on session 5 of 3.  He has been exercising before he started [facility] and cannot see any improvement, yet in his SOB.       Breathing Techniques   Goals Progress/Improvement seen  Yes       Comments Mr. Cid demonstrated proper technique of PLB while on the equipment in [facility].       Increase knowledge of respiratory medications   Goals Progress/Improvement seen  Yes       Comments Mr. Humiston has been given a spacer for  his MDIs to use at home.  He has been started on Flonase for sinus drainage.           11/22/15 1411 12/04/15 1453 12/06/15 1543       Weight Management   Comments   Eulas Post met witht Karolee Stamps RD to day.  He spent over an hour talking with her and setting personal nutrition goals. I will check with him in 2-3 weeks to see how the goals are progressing.     Increase Aerobic Exercise and Physical Activity   Goals Progress/Improvement seen   Yes      Comments  Mr Imhoff is progressing with his exercise goals. 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 With his improved increase in stamina in LW, We discussed possibly buying a tricycle, although a good one would be at less $2000.00       Understand more about Heart/Pulmonary Disease   Goals Progress/Improvement seen  Yes       Comments We talked about how pulmonary drugs effect the lung and how they work.  We also discussed cillia and their function in clearing mucus and the importance or taking maintenance medications.       Improve shortness of breath with ADL's   Goals Progress/Improvement seen  Yes Yes      Comments Mr. Lowella Grip has been seeing a chiropractor for a pinched nerve that is causing hip pain.  He is feeling stronger but would like to talk with the exercise physiologist to find exercised to strengthen his hamstring. Mr Tritschler states his breathing has improved. He  wakes up in the morning and does not feel like he needs a puff on his rescue inhaler and also has more breath with his daily activites around the house.      Breathing Techniques   Goals Progress/Improvement seen  Yes       Comments Mr. Rottenberg is performing proper technique with PLB while walking on the TM.       Increase knowledge of respiratory medications   Goals Progress/Improvement seen  Yes       Comments We discused the differences between rescue and maintance medications.  He is using his spacer with his rescue  inhaler in the mornings and evenings.          Personal Goals Discharge (Final Personal Goals and Risk Factors Review):      Goals and Risk Factor Review - 12/06/15 1543    Weight Management   Comments Eulas Post met witht Karolee Stamps RD to day.  He spent over an hour talking with her and setting personal nutrition goals. I will check with him in 2-3 weeks to see how the goals are progressing.      ITP Comments:     ITP Comments      10/30/15 1000 11/04/15 1016 11/11/15 1035 11/13/15 1000 11/13/15 1011   ITP Comments Estimated timetable to achieve ITP goals is 32 sessions Estimated timetable to achieve ITP goals is 30 sessions.  Estimated timetable to achieve ITP goals is 27 sessions. Mr Barrows was given our Strength Booklet with exercises to do at home with his weights.  Personal and exercise goals expected to be met in 26 more sessions. Progress on specific individualized goals will be charted in patient's ITP. Upon completion of the program the patient will be comfortable managing exercise goals and progression on their own.      11/15/15 1357 11/22/15 1220 11/27/15 1101 11/29/15 1046 12/04/15 1034   ITP Comments Goals are expected to be met in 25 more sessions.  Mr. Hollomon says that he is tired today and is unable to do as much as usual.  He does not feel sick, he seems to think that it is due to the cold weather and preparing for Christmas. Personal and exercise goals expected to be met in 24 more sessions. Progress on specific individualized goals will be charted in patient's ITP. Upon completion of the program the patient will be comfortable managing exercise goals and progression on their own.  Personal and exercise goals expected to be met in 23 more sessions. Progress on specific individualized goals will be charted in patient's ITP. Upon completion of the program the patient will be comfortable managing exercise goals and progression on their own.  Personal and exercise goals expected  to be met in25 more sessions. Progress on specific individualized goals will be charted in patient's ITP. Upon completion of the program the patient will be comfortable managing exercise goals and progression on their own.  Personal and exercise goals expected to be met in 21 more sessions. Progress on specific individualized goals will be charted in patient's ITP. Upon completion of the program the patient will be comfortable managing exercise goals and progression on their own.      12/06/15 1105 12/09/15 1103 12/13/15 1035       ITP Comments Personal and exercise goals expected to be met in 16 more sessions. Progress on specific individualized goals will be charted in patient's ITP. Upon completion of the program the patient will be comfortable managing exercise goals  and progression on their own. Personal and exercise goals anticipated to be met in 20 more sessions.  Personal and exercise goals expected to be met in 19 more sessions. Progress on specific individualized goals will be charted in patient's ITP. Upon completion of the program the patient will be comfortable managing exercise goals and progression on their own.         Comments: 30 Day Review

## 2015-12-16 NOTE — Progress Notes (Signed)
Daily Session Note  Patient Details  Name: Christopher Santiago MRN: [id number] Date of Birth: [birth year] Referring Provider:  Minna Merritts, MD  Encounter Date: 12/16/2015  Check In:     Session Check In - 12/16/15 1026    Check-In   Staff Present Carson Myrtle, BS, RRT, Respiratory Therapist;Steven Way, BS, ACSM EP-C, Exercise Physiologist;Mikaila Grunert Amedeo Plenty, BS, ACSM CEP, Exercise Physiologist   ER physicians immediately available to respond to emergencies [facility] immediately available ER MD   Physician(s) Joni Fears and Jimmye Norman   Medication changes reported     No   Fall or balance concerns reported    No   Warm-up and Cool-down Performed on first and last piece of equipment   VAD Patient? No   Pain Assessment   Currently in Pain? No/denies   Multiple Pain Sites No           Exercise Prescription Changes - 12/16/15 1000    Exercise Review   Progression Yes   Response to Exercise   Symptoms None   Comments Increases in exercise prescription were discusssed with patient. He tolerated the changes well with no signs or symptoms.    Duration Progress to 50 minutes of aerobic without signs/symptoms of physical distress   Intensity Rest + 30   Progression Continue progressive overload as per policy without signs/symptoms or physical distress.   Resistance Training   Training Prescription Yes   Weight 5   Reps 10-12   Interval Training   Interval Training No   Recumbant Bike   Level 6   RPM 65   Minutes 15   Recumbant Elliptical   Level 6   RPM 45   Minutes 15   T5 Nustep   Level 6   Watts 30   Minutes 15      Goals Met:  Proper associated with RPD/PD & O2 Sat Independence with exercise equipment Exercise tolerated well Personal goals reviewed Strength training completed today  Goals Unmet:  Not Applicable  Goals Comments: Reviewed individualized exercise prescription and made increases per departmental policy. Exercise increases were discussed with the patient  and they were able to perform the new work loads without issue (no signs or symptoms).     Dr. Emily Filbert is Medical Director for [place] and [facility].

## 2015-12-18 DIAGNOSIS — J449 Chronic obstructive pulmonary disease, unspecified: Secondary | ICD-10-CM | POA: Diagnosis not present

## 2015-12-18 NOTE — Progress Notes (Signed)
Daily Session Note  Patient Details  Name: EBER FERRUFINO MRN: [id number] Date of Birth: 1931-11-12 Referring Provider:  Minna Merritts, MD  Encounter Date: 12/18/2015  Check In:     Session Check In - 12/18/15 1023    Check-In   Staff Present Lestine Box, BS, ACSM EP-C, Exercise Physiologist;Laureen Owens Shark, BS, RRT, Respiratory Therapist   ER physicians immediately available to respond to emergencies [facility] immediately available ER MD   Physician(s) Clearnce Hasten and Mariea Clonts   Medication changes reported     No   Fall or balance concerns reported    No   Warm-up and Cool-down Performed on first and last piece of equipment   VAD Patient? No   Pain Assessment   Currently in Pain? No/denies         Goals Met:  Proper associated with RPD/PD & O2 Sat Exercise tolerated well No report of cardiac concerns or symptoms Strength training completed today  Goals Unmet:  Not Applicable  Goals Comments: Derrek is doing well, feels much stronger, breathing is good today with warmer weather.   Dr. Emily Filbert is Medical Director for [place] and [facility].

## 2015-12-20 ENCOUNTER — Encounter: Payer: Medicare Other | Admitting: *Deleted

## 2015-12-20 DIAGNOSIS — J449 Chronic obstructive pulmonary disease, unspecified: Secondary | ICD-10-CM

## 2015-12-20 NOTE — Progress Notes (Signed)
Daily Session Note  Patient Details  Name: Christopher Santiago MRN: [id number] Date of Birth: 1931-01-06 Referring Provider:  Minna Merritts, MD  Encounter Date: 12/20/2015  Check In:     Session Check In - 12/20/15 1046    Check-In   Staff Present Candiss Norse, MS, ACSM CEP, Exercise Physiologist;Stacey Blanch Media, RRT, RCP, Respiratory Therapist;Susanne Bice, RN, BSN, [place]   ER physicians immediately available to respond to emergencies See telemetry face sheet for immediately available ER MD   Medication changes reported     No   Fall or balance concerns reported    No   Warm-up and Cool-down Performed on first and last piece of equipment   VAD Patient? No   Pain Assessment   Currently in Pain? No/denies   Multiple Pain Sites No           Exercise Prescription Changes - 12/20/15 1000    Exercise Review   Progression Yes   Response to Exercise   Symptoms None   Comments Reviewed individualized exercise prescription and made increases per departmental policy. Exercise increases were discussed with the patient and they were able to perform the new work loads without issue (no signs or symptoms).    Duration Progress to 50 minutes of aerobic without signs/symptoms of physical distress   Intensity Rest + 30   Progression Continue progressive overload as per policy without signs/symptoms or physical distress.   Resistance Training   Training Prescription Yes   Weight 5   Reps 10-12   Interval Training   Interval Training No   Recumbant Bike   Level 6   RPM 65   Minutes 15   Recumbant Elliptical   Level 6   RPM 45   Minutes 15   T5 Nustep   Level 6   Watts 30   Minutes 15   Biostep-RELP   Level 6   Watts 40   Minutes 15      Goals Met:  Proper associated with RPD/PD & O2 Sat Independence with exercise equipment Using PLB without cueing & demonstrates good technique Exercise tolerated well Personal goals reviewed Strength training completed today  Goals  Unmet:  Not Applicable  Goals Comments: Patient completed exercise prescription and all exercise goals during rehab session. The exercise was tolerated well and the patient is progressing in the program.    Dr. Emily Filbert is Medical Director for [place] and [facility].

## 2015-12-23 ENCOUNTER — Encounter: Payer: Medicare Other | Admitting: *Deleted

## 2015-12-23 DIAGNOSIS — M5136 Other intervertebral disc degeneration, lumbar region: Secondary | ICD-10-CM | POA: Diagnosis not present

## 2015-12-23 DIAGNOSIS — M62830 Muscle spasm of back: Secondary | ICD-10-CM | POA: Diagnosis not present

## 2015-12-23 DIAGNOSIS — M9901 Segmental and somatic dysfunction of cervical region: Secondary | ICD-10-CM | POA: Diagnosis not present

## 2015-12-23 DIAGNOSIS — J449 Chronic obstructive pulmonary disease, unspecified: Secondary | ICD-10-CM | POA: Diagnosis not present

## 2015-12-23 DIAGNOSIS — M9903 Segmental and somatic dysfunction of lumbar region: Secondary | ICD-10-CM | POA: Diagnosis not present

## 2015-12-23 NOTE — Progress Notes (Signed)
Daily Session Note  Patient Details  Name: Christopher Santiago MRN: [id number] Date of Birth: 25-Jan-1931 Referring Provider:  Minna Merritts, MD  Encounter Date: 12/23/2015  Check In:     Session Check In - 12/23/15 1037    Check-In   Staff Present Carson Myrtle, BS, RRT, Respiratory Bertis Ruddy, BS, ACSM CEP, Exercise Physiologist;Steven Way, BS, ACSM EP-C, Exercise Physiologist   ER physicians immediately available to respond to emergencies [facility] immediately available ER MD   Physician(s) Dr. Burlene Arnt and Archie Balboa   Medication changes reported     No   Fall or balance concerns reported    No   Warm-up and Cool-down Performed on first and last piece of equipment   VAD Patient? No   Pain Assessment   Currently in Pain? No/denies   Multiple Pain Sites No           Exercise Prescription Changes - 12/23/15 1000    Exercise Review   Progression Yes   Response to Exercise   Symptoms None   Comments Reviewed individualized exercise prescription and made increases per departmental policy. Exercise increases were discussed with the patient and they were able to perform the new work loads without issue (no signs or symptoms).    Duration Progress to 50 minutes of aerobic without signs/symptoms of physical distress   Intensity Rest + 30   Progression Continue progressive overload as per policy without signs/symptoms or physical distress.   Resistance Training   Training Prescription Yes   Weight 5   Reps 10-12   Interval Training   Interval Training No   Recumbant Bike   Level 7   RPM 65   Minutes 15   Recumbant Elliptical   Level 6   RPM 50   Minutes 15   T5 Nustep   Level 6   Watts 30   Minutes 15   Biostep-RELP   Level 6   Watts 40   Minutes 15      Goals Met:  Proper associated with RPD/PD & O2 Sat Independence with exercise equipment Exercise tolerated well Personal goals reviewed Strength training completed today  Goals Unmet:  Not  Applicable  Goals Comments: Reviewed individualized exercise prescription and made increases per departmental policy. Exercise increases were discussed with the patient and they were able to perform the new work loads without issue (no signs or symptoms).     Dr. Emily Filbert is Medical Director for [place] and [facility].

## 2015-12-25 ENCOUNTER — Encounter: Payer: Medicare Other | Attending: Cardiovascular Disease | Admitting: *Deleted

## 2015-12-25 DIAGNOSIS — J449 Chronic obstructive pulmonary disease, unspecified: Secondary | ICD-10-CM | POA: Insufficient documentation

## 2015-12-25 NOTE — Progress Notes (Signed)
Daily Session Note  Patient Details  Name: Christopher Santiago MRN: [id number] Date of Birth: 11-25-1930 Referring Provider:  Minna Merritts, MD  Encounter Date: 12/25/2015  Check In:     Session Check In - 12/25/15 1106    Check-In   Staff Present Lestine Box, BS, ACSM EP-C, Exercise Physiologist;Mcgregor Tinnon Dillard Essex, MS, ACSM CEP, Exercise Physiologist;Laureen Owens Shark, BS, RRT, Respiratory Therapist   ER physicians immediately available to respond to emergencies [facility] immediately available ER MD   Physician(s) Kerman Passey and Jimmye Norman   Medication changes reported     No   Fall or balance concerns reported    No   Warm-up and Cool-down Performed on first and last piece of equipment   VAD Patient? No   Pain Assessment   Currently in Pain? No/denies   Multiple Pain Sites No         Goals Met:  Proper associated with RPD/PD & O2 Sat Independence with exercise equipment Using PLB without cueing & demonstrates good technique Exercise tolerated well Personal goals reviewed Strength training completed today  Goals Unmet:  Not Applicable  Goals Comments: Patient completed exercise prescription and all exercise goals during rehab session. The exercise was tolerated well and the patient is progressing in the program.    Dr. Emily Filbert is Medical Director for [place] and [facility].

## 2015-12-27 ENCOUNTER — Encounter: Payer: Medicare Other | Admitting: *Deleted

## 2015-12-27 DIAGNOSIS — J449 Chronic obstructive pulmonary disease, unspecified: Secondary | ICD-10-CM

## 2015-12-27 NOTE — Progress Notes (Signed)
Daily Session Note  Patient Details  Name: Christopher Santiago MRN: [id number] Date of Birth: 06-05-1931 Referring Provider:  Minna Merritts, MD  Encounter Date: 12/27/2015  Check In:     Session Check In - 12/27/15 1021    Check-In   Staff Present Candiss Norse, MS, ACSM CEP, Exercise Physiologist;Susanne Bice, RN, BSN, CCRP;Stacey Blanch Media, RRT, RCP, Respiratory Therapist   ER physicians immediately available to respond to emergencies [facility] immediately available ER MD   Physician(s) Jacqualine Code and Malinda   Medication changes reported     No   Fall or balance concerns reported    No   Warm-up and Cool-down Performed on first and last piece of equipment   VAD Patient? No   Pain Assessment   Currently in Pain? No/denies   Multiple Pain Sites No         Goals Met:  Proper associated with RPD/PD & O2 Sat Independence with exercise equipment Using PLB without cueing & demonstrates good technique Exercise tolerated well Personal goals reviewed Strength training completed today  Goals Unmet:  Not Applicable  Goals Comments: Patient completed exercise prescription and all exercise goals during rehab session. The exercise was tolerated well and the patient is progressing in the program.    Dr. Emily Filbert is Medical Director for [place] and [facility].

## 2015-12-30 ENCOUNTER — Encounter: Payer: Medicare Other | Admitting: *Deleted

## 2015-12-30 DIAGNOSIS — M5136 Other intervertebral disc degeneration, lumbar region: Secondary | ICD-10-CM | POA: Diagnosis not present

## 2015-12-30 DIAGNOSIS — J449 Chronic obstructive pulmonary disease, unspecified: Secondary | ICD-10-CM | POA: Diagnosis not present

## 2015-12-30 DIAGNOSIS — M62830 Muscle spasm of back: Secondary | ICD-10-CM | POA: Diagnosis not present

## 2015-12-30 DIAGNOSIS — M9901 Segmental and somatic dysfunction of cervical region: Secondary | ICD-10-CM | POA: Diagnosis not present

## 2015-12-30 DIAGNOSIS — M9903 Segmental and somatic dysfunction of lumbar region: Secondary | ICD-10-CM | POA: Diagnosis not present

## 2015-12-30 NOTE — Progress Notes (Signed)
Daily Session Note  Patient Details  Name: [name] MRN: [id number] Date of Birth: 1931-05-10 Referring Provider:  Minna Merritts, MD  Encounter Date: 12/30/2015  Check In:     Session Check In - 12/30/15 1026    Check-In   Staff Present Candiss Norse, MS, ACSM CEP, Exercise Physiologist;Laureen Owens Shark, BS, RRT, Respiratory Bertis Ruddy, BS, ACSM CEP, Exercise Physiologist   Supervising physician immediately available to respond to emergencies [facility] immediately available ER MD   Physician(s) Clearnce Hasten and Paduchowski   Medication changes reported     No   Fall or balance concerns reported    No   Warm-up and Cool-down Performed on first and last piece of equipment   Resistance Training Performed No   VAD Patient? No   Pain Assessment   Currently in Pain? No/denies   Multiple Pain Sites No         Goals Met:  Proper associated with RPD/PD & O2 Sat Independence with exercise equipment Using PLB without cueing & demonstrates good technique Exercise tolerated well Personal goals reviewed Strength training completed today  Goals Unmet:  Not Applicable  Goals Comments: Some tiredness and dizziness. Still completed exercise, used lower intensity and intervals.    Dr. Emily Filbert is Medical Director for [place] and [facility].

## 2016-01-01 ENCOUNTER — Encounter: Payer: Medicare Other | Admitting: *Deleted

## 2016-01-01 DIAGNOSIS — J449 Chronic obstructive pulmonary disease, unspecified: Secondary | ICD-10-CM | POA: Diagnosis not present

## 2016-01-01 DIAGNOSIS — M9901 Segmental and somatic dysfunction of cervical region: Secondary | ICD-10-CM | POA: Diagnosis not present

## 2016-01-01 DIAGNOSIS — M62830 Muscle spasm of back: Secondary | ICD-10-CM | POA: Diagnosis not present

## 2016-01-01 DIAGNOSIS — M9903 Segmental and somatic dysfunction of lumbar region: Secondary | ICD-10-CM | POA: Diagnosis not present

## 2016-01-01 DIAGNOSIS — M5136 Other intervertebral disc degeneration, lumbar region: Secondary | ICD-10-CM | POA: Diagnosis not present

## 2016-01-01 NOTE — Progress Notes (Signed)
Daily Session Note  Patient Details  Name: Christopher Santiago MRN: [id number] Date of Birth: 17-Jul-1931 Referring Provider:  Minna Merritts, MD  Encounter Date: 01/01/2016  Check In:     Session Check In - 01/01/16 1108    Check-In   Location [facility]   Staff Present Heath Lark, RN, BSN, CCRP;Laureen Owens Shark, BS, RRT, Respiratory Griffin Basil, MS, ACSM CEP, Exercise Physiologist   Supervising physician immediately available to respond to emergencies [facility] immediately available ER MD   Physician(s) Drs: Archie Balboa and Burlene Arnt   Medication changes reported     No   Fall or balance concerns reported    No   Warm-up and Cool-down Performed on first and last piece of equipment   Resistance Training Performed No   VAD Patient? No   Pain Assessment   Currently in Pain? No/denies           Exercise Prescription Changes - 01/01/16 1100    Exercise Review   Progression Yes   Response to Exercise   Symptoms None   Comments Reviewed individualized exercise prescription and made increases per departmental policy. Exercise increases were discussed with the patient and they were able to perform the new work loads without issue (no signs or symptoms).    Duration Progress to 50 minutes of aerobic without signs/symptoms of physical distress   Intensity Rest + 30   Progression Continue progressive overload as per policy without signs/symptoms or physical distress.   Resistance Training   Training Prescription Yes   Weight 5   Reps 10-12   Interval Training   Interval Training No   Recumbant Bike   Level 7   RPM 65   Minutes 15   Recumbant Elliptical   Level 6   RPM 50   Minutes 15   T5 Nustep   Level 7  Alternate level 6 40 wawtts 10 min   Level 7 40 watts 5 min   Watts 40   Minutes 5   Biostep-RELP   Level 6   Watts 40   Minutes 15      Goals Met:  Independence with exercise equipment Exercise tolerated well Strength training  completed today  Goals Unmet:  Not Applicable  Goals Comments: Doing well with exercise prescription progression.    Dr. Emily Filbert is Medical Director for [place] and [facility].

## 2016-01-03 ENCOUNTER — Encounter: Payer: Medicare Other | Admitting: *Deleted

## 2016-01-03 DIAGNOSIS — M5136 Other intervertebral disc degeneration, lumbar region: Secondary | ICD-10-CM | POA: Diagnosis not present

## 2016-01-03 DIAGNOSIS — M9903 Segmental and somatic dysfunction of lumbar region: Secondary | ICD-10-CM | POA: Diagnosis not present

## 2016-01-03 DIAGNOSIS — J449 Chronic obstructive pulmonary disease, unspecified: Secondary | ICD-10-CM | POA: Diagnosis not present

## 2016-01-03 DIAGNOSIS — M9901 Segmental and somatic dysfunction of cervical region: Secondary | ICD-10-CM | POA: Diagnosis not present

## 2016-01-03 DIAGNOSIS — M62830 Muscle spasm of back: Secondary | ICD-10-CM | POA: Diagnosis not present

## 2016-01-03 NOTE — Progress Notes (Signed)
Daily Session Note  Patient Details  Name: Christopher Santiago MRN: [id number] Date of Birth: 11/20/1931 Referring Provider:  Minna Merritts, MD  Encounter Date: 01/03/2016  Check In:     Session Check In - 01/03/16 1050    Check-In   Staff Present Candiss Norse, MS, ACSM CEP, Exercise Physiologist;Susanne Bice, RN, BSN, CCRP;Stacey Blanch Media, RRT, RCP, Respiratory Therapist   Supervising physician immediately available to respond to emergencies [facility] immediately available ER MD   Physician(s) Edd Fabian and Jacqualine Code   Medication changes reported     No   Fall or balance concerns reported    No   Warm-up and Cool-down Performed on first and last piece of equipment   Resistance Training Performed Yes   VAD Patient? No   Pain Assessment   Currently in Pain? No/denies   Multiple Pain Sites No           Exercise Prescription Changes - 01/03/16 1000    Exercise Review   Progression Yes   Response to Exercise   Symptoms None   Comments [name] now does interval training as tolerated all equipment.   Duration Progress to 50 minutes of aerobic without signs/symptoms of physical distress   Intensity Rest + 30   Progression Continue progressive overload as per policy without signs/symptoms or physical distress.   Resistance Training   Training Prescription Yes   Weight 5   Reps 10-12   Interval Training   Interval Training Yes   Equipment Recumbant Bike;NuStep;Recumbant Elliptical  REL = BioStep   Recumbant Bike   Level 7   RPM 65   Minutes 15   Recumbant Elliptical   Level 6   RPM 50   Minutes 15   T5 Nustep   Level 7  Alternate level 6 40 wawtts 10 min   Level 7 40 watts 5 min   Watts 40   Minutes 5   Biostep-RELP   Level 6   Watts 40   Minutes 15      Goals Met:  Proper associated with RPD/PD & O2 Sat Independence with exercise equipment Using PLB without cueing & demonstrates good technique Exercise tolerated well Strength training completed today  Goals  Unmet:  Not Applicable  Goals Comments: Patient completed exercise prescription and all exercise goals during rehab session. The exercise was tolerated well and the patient is progressing in the program.    Dr. Emily Filbert is Medical Director for [place] and [facility].

## 2016-01-06 ENCOUNTER — Encounter: Payer: Medicare Other | Admitting: *Deleted

## 2016-01-06 DIAGNOSIS — M9903 Segmental and somatic dysfunction of lumbar region: Secondary | ICD-10-CM | POA: Diagnosis not present

## 2016-01-06 DIAGNOSIS — M5136 Other intervertebral disc degeneration, lumbar region: Secondary | ICD-10-CM | POA: Diagnosis not present

## 2016-01-06 DIAGNOSIS — J449 Chronic obstructive pulmonary disease, unspecified: Secondary | ICD-10-CM

## 2016-01-06 DIAGNOSIS — M9901 Segmental and somatic dysfunction of cervical region: Secondary | ICD-10-CM | POA: Diagnosis not present

## 2016-01-06 DIAGNOSIS — M62830 Muscle spasm of back: Secondary | ICD-10-CM | POA: Diagnosis not present

## 2016-01-06 NOTE — Progress Notes (Signed)
Daily Session Note  Patient Details  Name: Christopher Santiago MRN: [id number] Date of Birth: Mar 17, 1931 Referring Provider:  Minna Merritts, MD  Encounter Date: 01/06/2016  Check In:     Session Check In - 01/06/16 1052    Check-In   Location [facility]   Staff Present Carson Myrtle, BS, RRT, Respiratory Therapist;Bronda Alfred Amedeo Plenty, BS, ACSM CEP, Exercise Physiologist   Supervising physician immediately available to respond to emergencies [facility] immediately available ER MD   Physician(s) Mariea Clonts and Corky Downs   Medication changes reported     No   Fall or balance concerns reported    No   Warm-up and Cool-down Performed on first and last piece of equipment   Resistance Training Performed Yes   VAD Patient? No   Pain Assessment   Currently in Pain? No/denies   Multiple Pain Sites No         Goals Met:  Proper associated with RPD/PD & O2 Sat Independence with exercise equipment Exercise tolerated well Strength training completed today  Goals Unmet:  Not Applicable  Goals Comments: Patient completed exercise prescription and all exercise goals during rehab session. The exercise was tolerated well and the patient is progressing in the program.     Dr. Emily Filbert is Medical Director for [place] and [facility].

## 2016-01-08 ENCOUNTER — Encounter: Payer: Medicare Other | Admitting: *Deleted

## 2016-01-08 DIAGNOSIS — J449 Chronic obstructive pulmonary disease, unspecified: Secondary | ICD-10-CM | POA: Diagnosis not present

## 2016-01-08 NOTE — Progress Notes (Signed)
Daily Session Note  Patient Details  Name: Christopher Santiago MRN: [id number] Date of Birth: Feb 26, 1931 Referring Provider:  Minna Merritts, MD  Encounter Date: 01/08/2016  Check In:     Session Check In - 01/08/16 1056    Check-In   Staff Present Carson Myrtle, BS, RRT, Respiratory Therapist;Renee Dillard [place], MS, ACSM CEP, Exercise Physiologist;Susanne Bice, RN, BSN, [place] physician immediately available to respond to emergencies [facility] immediately available ER MD   Physician(s) Drs: Jimmye Norman and Mariea Clonts   Medication changes reported     No   Fall or balance concerns reported    No   Warm-up and Cool-down Performed on first and last piece of equipment   Resistance Training Performed No   VAD Patient? No   Pain Assessment   Currently in Pain? No/denies         Goals Met:  Independence with exercise equipment Exercise tolerated well Strength training completed today  Goals Unmet:  Not Applicable  Goals Comments: Doing well with exercise prescription progression.    Dr. Emily Filbert is Medical Director for [place] and [facility].

## 2016-01-09 DIAGNOSIS — M5136 Other intervertebral disc degeneration, lumbar region: Secondary | ICD-10-CM | POA: Diagnosis not present

## 2016-01-09 DIAGNOSIS — M9903 Segmental and somatic dysfunction of lumbar region: Secondary | ICD-10-CM | POA: Diagnosis not present

## 2016-01-09 DIAGNOSIS — M62830 Muscle spasm of back: Secondary | ICD-10-CM | POA: Diagnosis not present

## 2016-01-09 DIAGNOSIS — M9901 Segmental and somatic dysfunction of cervical region: Secondary | ICD-10-CM | POA: Diagnosis not present

## 2016-01-10 ENCOUNTER — Encounter: Payer: Medicare Other | Admitting: *Deleted

## 2016-01-10 DIAGNOSIS — J449 Chronic obstructive pulmonary disease, unspecified: Secondary | ICD-10-CM | POA: Diagnosis not present

## 2016-01-10 NOTE — Progress Notes (Signed)
Daily Session Note  Patient Details  Name: Christopher Santiago MRN: [id number] Date of Birth: 1930-12-29 Referring Provider:  Minna Merritts, MD  Encounter Date: 01/10/2016  Check In:     Session Check In - 01/10/16 1111    Check-In   Location [facility]   Staff Present Candiss Norse, MS, ACSM CEP, Exercise Physiologist;Stacey Blanch Media, RRT, RCP, Respiratory Therapist;Susanne Bice, RN, BSN, CCRP   Supervising physician immediately available to respond to emergencies [facility] immediately available ER MD   Physician(s) Marcelene Butte and Jimmye Norman   Medication changes reported     No   Fall or balance concerns reported    No   Warm-up and Cool-down Performed on first and last piece of equipment   Resistance Training Performed Yes   VAD Patient? No   Pain Assessment   Currently in Pain? No/denies   Multiple Pain Sites No         Goals Met:  Proper associated with RPD/PD & O2 Sat Independence with exercise equipment Using PLB without cueing & demonstrates good technique Exercise tolerated well Strength training completed today  Goals Unmet:  Not Applicable  Goals Comments: Patient completed exercise prescription and all exercise goals during rehab session. The exercise was tolerated well and the patient is progressing in the program.    Dr. Emily Filbert is Medical Director for [name] and [facility].

## 2016-01-13 NOTE — Addendum Note (Signed)
Addended by: Karie Fetch on: 01/13/2016 08:39 AM   Modules accepted: Orders

## 2016-01-13 NOTE — Progress Notes (Signed)
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 Review Code  2- meets goals/outcomes      General Nutrition Guidelines/Fats and Fiber: -Group instruction provided by verbal, written material, models and posters to present the general guidelines for heart healthy nutrition. Gives an explanation and review of dietary fats and fiber.      Pulmonary Rehab  from 01/08/2016 in [facility] Cardiac and Pulmonary Rehab   Date  12/30/15   Educator  CE   Instruction Review Code  2- meets goals/outcomes      Controlling Sodium/Reading Food Labels: -Group verbal and written material supporting the discussion of sodium use in heart healthy nutrition. Review and explanation with models, verbal and written materials for utilization of the food label.   Exercise Physiology & Risk Factors: - Group verbal and written instruction with models to review the exercise physiology of the cardiovascular system and associated critical values. Details cardiovascular disease risk factors and the goals associated with each risk factor.      Pulmonary Rehab from 01/08/2016 in [facility] Cardiac and Pulmonary Rehab   Date  10/30/15   Educator  SW   Instruction Review Code  2- meets goals/outcomes      Aerobic Exercise & Resistance Training: - Gives group verbal and written discussion on the health impact of inactivity. On the components of aerobic and resistive training programs and the benefits of this training and how to safely progress through these programs.      Pulmonary Rehab from 01/08/2016 in [facility] Cardiac and Pulmonary Rehab   Date  12/18/15   Educator  SW   Instruction Review Code  2- meets goals/outcomes      Flexibility, Balance, General Exercise Guidelines: - Provides group verbal and written instruction on the benefits of flexibility and balance training programs. Provides general exercise guidelines with specific guidelines to those with heart or lung disease. Demonstration and skill practice provided.      Pulmonary Rehab from 01/08/2016 in [facility] Cardiac and Pulmonary Rehab   Date  01/08/16   Educator  RM   Instruction Review Code  2- meets goals/outcomes      Stress Management: - Provides group verbal and written instruction about the health risks of elevated stress, cause of high stress, and healthy ways to reduce stress.  Depression: - Provides group  verbal and written instruction on the correlation between heart/lung disease and depressed mood, treatment options, and the stigmas associated with seeking treatment.      Pulmonary Rehab from 01/08/2016 in [facility] Cardiac and Pulmonary Rehab   Date  12/04/15   Educator  [facility]   Instruction Review Code  2- meets goals/outcomes      Exercise & Equipment Safety: - Individual verbal instruction and demonstration of equipment use and safety with use of the equipment.      Pulmonary Rehab from 01/08/2016 in [facility] Cardiac and Pulmonary Rehab   Date  10/25/15   Educator  [place]   Instruction Review Code  2- meets goals/outcomes      Infection Prevention: - Provides verbal and written material to individual with discussion of infection control including proper hand washing and proper equipment cleaning during exercise session.      Pulmonary Rehab from 01/08/2016 in [facility] Cardiac and Pulmonary Rehab   Date  10/25/15   Educator  [place]   Instruction Review Code  2- meets goals/outcomes      Falls Prevention: - Provides verbal and written material to individual with discussion of falls prevention and safety.      Pulmonary Rehab from 01/08/2016 in [facility] Cardiac and Pulmonary Rehab   Date  10/22/15   Educator  LB      Diabetes: - Individual verbal and written instruction to review signs/symptoms of diabetes, desired ranges of glucose level fasting, after meals and with exercise. Advice that pre and post exercise glucose checks will be done for 3 sessions at entry of program.   Chronic Lung Diseases: - Group verbal and written instruction to review new updates, new respiratory medications, new advancements in procedures and treatments. Provide informative websites and "800" numbers of self-education.      Pulmonary Rehab from 01/08/2016 in [facility] Cardiac and Pulmonary Rehab   Date  01/06/16 [facility]]   Educator  LB   Instruction Review Code  2- meets goals/outcomes      Lung Procedures: - Group  verbal and written instruction to describe testing methods done to diagnose lung disease. Review the outcome of test results. Describe the treatment choices: Pulmonary Function Tests, ABGs and oximetry.      Pulmonary Rehab from 01/08/2016 in [facility] Cardiac and Pulmonary Rehab   Date  01/03/16   Educator  [place]   Instruction Review Code  2- meets goals/outcomes      Energy Conservation: - Provide group verbal and written instruction for methods to conserve energy, plan and organize activities. Instruct on pacing techniques, use of adaptive equipment and posture/positioning to relieve shortness of breath.   Triggers: - Group verbal and written instruction to review types of environmental controls: home humidity, furnaces, filters, dust mite/pet prevention, HEPA vacuums. To discuss weather changes, air quality and the benefits of nasal washing.      Pulmonary Rehab from 01/08/2016 in [facility] Cardiac and Pulmonary Rehab   Date  12/09/15   Educator  LB   Instruction Review Code  2- meets goals/outcomes      Exacerbations: - Group verbal and written instruction to provide: warning signs, infection symptoms, calling MD promptly, preventive modes, and value of vaccinations. Review: effective airway clearance, coughing and/or vibration techniques. Create an Sports administrator.      Pulmonary Rehab from 01/08/2016 in [facility] Cardiac and Pulmonary Rehab   Date  11/06/15   Educator  LB   Instruction Review Code  2- meets  goals/outcomes      Oxygen: - Individual and group verbal and written instruction on oxygen therapy. Includes supplement oxygen, available portable oxygen systems, continuous and intermittent flow rates, oxygen safety, concentrators, and Medicare reimbursement for oxygen.   Respiratory Medications: - Group verbal and written instruction to review medications for lung disease. Drug class, frequency, complications, importance of spacers, rinsing mouth after steroid MDI's, and proper cleaning  methods for nebulizers.      Pulmonary Rehab from 01/08/2016 in [facility] Cardiac and Pulmonary Rehab   Date  10/22/15   Educator  LB      AED/CPR: - Group verbal and written instruction with the use of models to demonstrate the basic use of the AED with the basic ABC's of resuscitation.   Breathing Retraining: - Provides individuals verbal and written instruction on purpose, frequency, and proper technique of diaphragmatic breathing and pursed-lipped breathing. Applies individual practice skills.      Pulmonary Rehab from 01/08/2016 in [facility] Cardiac and Pulmonary Rehab   Date  10/25/15   Educator  [place]   Instruction Review Code  2- meets goals/outcomes      Anatomy and Physiology of the Lungs: - Group verbal and written instruction with the use of models to provide basic lung anatomy and physiology related to function, structure and complications of lung disease.      Pulmonary Rehab from 01/08/2016 in [facility] Cardiac and Pulmonary Rehab   Date  11/22/15   Educator  [place]   Instruction Review Code  2- meets goals/outcomes      Heart Failure: - Group verbal and written instruction on the basics of heart failure: signs/symptoms, treatments, explanation of ejection fraction, enlarged heart and cardiomyopathy.      Pulmonary Rehab from 01/08/2016 in [facility] Cardiac and Pulmonary Rehab   Date  12/13/15   Educator  [place]   Instruction Review Code  2- meets goals/outcomes      Sleep Apnea: - Individual verbal and written instruction to review Obstructive Sleep Apnea. Review of risk factors, methods for diagnosing and types of masks and machines for OSA.   Anxiety: - Provides group, verbal and written instruction on the correlation between heart/lung disease and anxiety, treatment options, and management of anxiety.   Relaxation: - Provides group, verbal and written instruction about the benefits of relaxation for patients with heart/lung disease. Also provides patients with examples of relaxation  techniques.   Knowledge Questionnaire Score:     Knowledge Questionnaire Score - 10/22/15 0900    Knowledge Questionnaire Score   Pre Score -4      Personal Goals and Risk Factors at Admission:     Personal Goals and Risk Factors at Admission - 10/22/15 0900    Personal Goals and Risk Factors on Admission   Increase Aerobic Exercise and Physical Activity Yes   Intervention While in program, learn and follow the exercise prescription taught. Start at a low level workload and increase workload after able to maintain previous level for 30 minutes. Increase time before increasing intensity.  Mr cu wants to improve his stamina. He wants to return to the [employer]  for his workouts, and continue camping, church activites, and activities  with his wife.   Understand more about Heart/Pulmonary Disease. Yes   Intervention While in program utilize professionals for any questions, and attend the education sessions. Great websites to use are www.americanheart.org or www.lung.org for reliable information.  Mr Cordoba has had COPD for many years, but he states he does not know  much about it and is interested in learning more. Mr Lowe has OSA and wears 10cmH2O pressure with nasal mask  through [place].   Improve shortness of breath with ADL's Yes   Intervention While in program, learn and follow the exercise prescription taught. Start at a low level workload and increase workload ad advised by the exercise physiologist. Increase time before increasing intensity.  Mr Thompson does want to improve his breathing and have more confidence in managing it with out fear.   Develop more efficient breathing techniques such as purse lipped breathing and diaphragmatic breathing; and practicing self-pacing with activity Yes   Intervention --  Instructed Mr Fomby in PLB and DB - good technique.   Increase knowledge of respiratory medications and ability to use respiratory devices properly.  Yes    Intervention While in program, learn to administer MDI, nebulizer, and spacer properly.;Learn to take respiratory medicine as ordered.;While in program, learn to Clean MDI, nebulizers, and spacers properly.  Mr penkala was instructed on Spiriva and Ventolin MDIs. A spacer was given to him for use with the Ventolin with instruction.      Personal Goals and Risk Factors Review:      Goals and Risk Factor Review      11/01/15 1501 11/04/15 1000 11/06/15 1042 11/11/15 1039 11/22/15 1247   Weight Management   Comments   --  Esdras is aware that he is overweight.  HE wants to concentrate on his activity and improving his stamina while in the program. HE does not want to focus on weight loss at this time. HE is aware that the exercise may benefit towards weight loss.  Xylan has a BMI greater than 30. I reviewed this with him. We discussed opportunity for resouces to help him work on decreasing his BMI. Mainly today we focused on nutriton and exercise as his resources. I will contact the RD to set up a nutrition counseling appointment with [facility]. He will complete the Rate Your PLate and Food Diary and bring it back next week.  After he mets with the RD we will look at the goals and focus on working on one or two goals will he remains in the program and for discahrge.    Increase Aerobic Exercise and Physical Activity   Goals Progress/Improvement seen     Yes    Comments    Patient has increased his exericse intensities since starting the program (see exercise changes) and has tolerated the increases well. He stated that he feels stronger and he is now able to walk from the elevator to class, which he used to not be able to do. He stated that his SOB is him most limiting factor in what he is able to do, and that he has not seen much change in SOB yet.     Understand more about Heart/Pulmonary Disease   Goals Progress/Improvement seen   Yes      Comments  Educated on COPD - reviewed Spirometry and results;  recommended Mr Droke get a complete PFT for DLCO and TLC; educated on importance of exercise and using his inhalers to slow COPD progression.      Improve shortness of breath with ADL's   Goals Progress/Improvement seen  No       Comments Mr. Lowella Grip is on session 5 of 76.  He has been exercising before he started [facility] and cannot see any improvement, yet in his SOB.       Breathing Techniques  Goals Progress/Improvement seen  Yes       Comments Mr. Mirsky demonstrated proper technique of PLB while on the equipment in [place].       Increase knowledge of respiratory medications   Goals Progress/Improvement seen  Yes       Comments Mr. Angus has been given a spacer for his MDIs to use at home.  He has been started on Flonase for sinus drainage.           11/22/15 1411 12/04/15 1453 12/06/15 1543 12/25/15 1000 12/27/15 1023   Weight Management   Comments   Eulas Post met witht Karolee Stamps RD to day.  He spent over an hour talking with her and setting personal nutrition goals. I will check with him in 2-3 weeks to see how the goals are progressing.     Increase Aerobic Exercise and Physical Activity   Goals Progress/Improvement seen   Yes  Yes Yes   Comments  Mr Hilligoss is progressing with his exercise goals. 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 With his improved increase in stamina in LW, We discussed possibly buying a tricycle, although a good one would be at less $2000.00   Mr Easler states that his stamina has improved with his participation in [place]. He can do some of his home activites for a longer time period. Grayling is doing very well in the program and makes exercise progressions weekly, if not in every session. He has a passion for biking and has a trainer he can stationary ride on. His ultimate goal is to get strong enough to bikes again. We disucssed that there may be other bikes that may be easier for him to ride than his  moutain or road bikes and this could help with the transition back to biking. Overall, his stamina is good and he can exercise continuously for the entire class. Moving forward, we will continue to progress [place] by increasing his exercise intensity.    Understand more about Heart/Pulmonary Disease   Goals Progress/Improvement seen  Yes       Comments We talked about how pulmonary drugs effect the lung and how they work.  We also discussed cillia and their function in clearing mucus and the importance or taking maintenance medications.       Improve shortness of breath with ADL's   Goals Progress/Improvement seen  Yes Yes  Yes    Comments Mr. Lowella Grip has been seeing a chiropractor for a pinched nerve that is causing hip pain.  He is feeling stronger but would like to talk with the exercise physiologist to find exercised to strengthen his hamstring. Mr Rafferty states his breathing has improved. He wakes up in the morning and does not feel like he needs a puff on his rescue inhaler and also has more breath with his daily activites around the house.  Mr Pagliarulo states that he can now walk to the mailbox and back to the house without stopping to get his breath. The mailbox is about 336f from the house.    Breathing Techniques   Goals Progress/Improvement seen  Yes       Comments Mr. KSperbeckis performing proper technique with PLB while walking on the TM.       Increase knowledge of respiratory medications   Goals Progress/Improvement seen  Yes       Comments We discused the differences between rescue and maintance medications.  He is  using his spacer with his rescue inhaler in the mornings and evenings.         12/27/15 1426 12/27/15 1510 01/03/16 1255       Weight Management   Comments  Glnn stated that he has been making changes as advised by the dietician. He feels that he has lost some weight. HIs chart shows fluctuation of 2-3 pounds since admission. Jacari will continue to work on his nutrition goals.  Tauno stated today he is not adding any salt to his meals.  Also had stated  he is eating oatmeal for breakfast. He had not been eating breakfast prior to starting to eat the oatmeal.     Understand more about Heart/Pulmonary Disease   Comments We talked about cardiac medications today.  He understands the improtance of taking both heart and lung medications together because they work together.       Improve shortness of breath with ADL's   Goals Progress/Improvement seen  Yes       Comments Mr. Lewellen say that he can notice his strenght coming back and that he has more endurance since starting exercise.       Increase knowledge of respiratory medications   Comments Using PLB on the equipment in the gym when he is on Level 6 and 7.          Personal Goals Discharge (Final Personal Goals and Risk Factors Review):      Goals and Risk Factor Review - 01/03/16 1255    Weight Management   Comments Vedant stated today he is not adding any salt to his meals.  Also had stated  he is eating oatmeal for breakfast. He had not been eating breakfast prior to starting to eat the oatmeal.      ITP Comments:     ITP Comments      10/30/15 1000 11/04/15 1016 11/11/15 1035 11/13/15 1000 11/13/15 1011   ITP Comments Estimated timetable to achieve ITP goals is 32 sessions Estimated timetable to achieve ITP goals is 30 sessions.  Estimated timetable to achieve ITP goals is 27 sessions. Mr Silvera was given our Strength Booklet with exercises to do at home with his weights.  Personal and exercise goals expected to be met in 26 more sessions. Progress on specific individualized goals will be charted in patient's ITP. Upon completion of the program the patient will be comfortable managing exercise goals and progression on their own.      11/15/15 1357 11/22/15 1220 11/27/15 1101 11/29/15 1046 12/04/15 1034   ITP Comments Goals are expected to be met in 25 more sessions.  Mr. Clairmont says that he is tired today and  is unable to do as much as usual.  He does not feel sick, he seems to think that it is due to the cold weather and preparing for Christmas. Personal and exercise goals expected to be met in 24 more sessions. Progress on specific individualized goals will be charted in patient's ITP. Upon completion of the program the patient will be comfortable managing exercise goals and progression on their own.  Personal and exercise goals expected to be met in 23 more sessions. Progress on specific individualized goals will be charted in patient's ITP. Upon completion of the program the patient will be comfortable managing exercise goals and progression on their own.  Personal and exercise goals expected to be met in25 more sessions. Progress on specific individualized goals will be charted in patient's ITP. Upon completion of the program  the patient will be comfortable managing exercise goals and progression on their own.  Personal and exercise goals expected to be met in 21 more sessions. Progress on specific individualized goals will be charted in patient's ITP. Upon completion of the program the patient will be comfortable managing exercise goals and progression on their own.      12/06/15 1105 12/09/15 1103 12/13/15 1035 12/18/15 1027 12/18/15 1047   ITP Comments Personal and exercise goals expected to be met in 16 more sessions. Progress on specific individualized goals will be charted in patient's ITP. Upon completion of the program the patient will be comfortable managing exercise goals and progression on their own. Personal and exercise goals anticipated to be met in 20 more sessions.  Personal and exercise goals expected to be met in 19 more sessions. Progress on specific individualized goals will be charted in patient's ITP. Upon completion of the program the patient will be comfortable managing exercise goals and progression on their own.  Isai is making significant gains in class, feels good today with the warmer  weather, breathing doing well. personal exercise goals to be accomplished in 23 more sessions. Pt. will be at a comfortable level to be albe to continue exercising on her own.     12/20/15 1047 12/23/15 1040 12/25/15 1107 12/27/15 1023 12/30/15 1027   ITP Comments Personal and exercise goals expected to be met in 16 more sessions. Progress on specific individualized goals will be charted in patient's ITP. Upon completion of the program the patient will be comfortable managing exercise goals and progression on their own.  Personal and exercise goals are expected to be met in 15 more seesions.  Personal and exercise goals expected to be met in  14 more sessions. Progress on specific individualized goals will be charted in patient's ITP. Upon completion of the program the patient will be comfortable managing exercise goals and progression on their own.  Personal and exercise goals expected to be met in 13 more sessions. Progress on specific individualized goals will be charted in patient's ITP. Upon completion of the program the patient will be comfortable managing exercise goals and progression on their own.  Personal and exercise goals expected to be met in 12 more sessions. Progress on specific individualized goals will be charted in patient's ITP. Upon completion of the program the patient will be comfortable managing exercise goals and progression on their own.      01/01/16 1109 01/01/16 1445 01/03/16 1051 01/06/16 1055 01/08/16 1057   ITP Comments Personal and exercise goals expected to be met in 11 more sessions. Progress on specific individualized goals will be charted in patient's ITP. Upon completion of the program the patient will be comfortable managing exercise goals and progression on their own.  Mr Hauschild performed interval training on all 3 goals and did very well with acceptable RPE/PD's. Personal and exercise goals expected to be met in 10 more sessions. Progress on specific individualized goals  will be charted in patient's ITP. Upon completion of the program the patient will be comfortable managing exercise goals and progression on their own.  Personal and exercise goals are expected to be met in 9 more sessions. See ITP for specific goal progress.  Personal and exercise goals are expected to be met in 8 more sessions. See ITP for specific goal progress.      01/10/16 1111           ITP Comments Personal and exercise goals expected to be met  in 7 more sessions. Progress on specific individualized goals will be charted in patient's ITP. Upon completion of the program the patient will be comfortable managing exercise goals and progression on their own.           Comments:  30 day review note

## 2016-01-14 DIAGNOSIS — M62830 Muscle spasm of back: Secondary | ICD-10-CM | POA: Diagnosis not present

## 2016-01-14 DIAGNOSIS — M5136 Other intervertebral disc degeneration, lumbar region: Secondary | ICD-10-CM | POA: Diagnosis not present

## 2016-01-14 DIAGNOSIS — M9903 Segmental and somatic dysfunction of lumbar region: Secondary | ICD-10-CM | POA: Diagnosis not present

## 2016-01-14 DIAGNOSIS — M9901 Segmental and somatic dysfunction of cervical region: Secondary | ICD-10-CM | POA: Diagnosis not present

## 2016-01-15 ENCOUNTER — Encounter: Payer: Medicare Other | Admitting: *Deleted

## 2016-01-15 DIAGNOSIS — J449 Chronic obstructive pulmonary disease, unspecified: Secondary | ICD-10-CM

## 2016-01-15 NOTE — Progress Notes (Signed)
Daily Session Note  Patient Details  Name: SHAQUAN MISSEY MRN: [id number] Date of Birth: 08-25-1931 Referring Provider:  Minna Merritts, MD  Encounter Date: 01/15/2016  Check In:     Session Check In - 01/15/16 1120    Check-In   Location [facility]   Staff Present Candiss Norse, MS, ACSM CEP, Exercise Physiologist;Steven Way, BS, ACSM EP-C, Exercise Physiologist;Laureen Owens Shark, BS, RRT, Respiratory Therapist   Supervising physician immediately available to respond to emergencies [facility] immediately available ER MD   Physician(s) Owens Shark and Archie Balboa   Medication changes reported     No   Fall or balance concerns reported    No   Warm-up and Cool-down Performed on first and last piece of equipment   Resistance Training Performed Yes   VAD Patient? No   Pain Assessment   Currently in Pain? No/denies   Multiple Pain Sites No         Goals Met:  Proper associated with RPD/PD & O2 Sat Independence with exercise equipment Using PLB without cueing & demonstrates good technique Exercise tolerated well Strength training completed today  Goals Unmet:  Not Applicable  Goals Comments: Patient completed exercise prescription and all exercise goals during rehab session. The exercise was tolerated well and the patient is progressing in the program.    Dr. Emily Filbert is Medical Director for [place] and [facility].

## 2016-01-16 DIAGNOSIS — M9901 Segmental and somatic dysfunction of cervical region: Secondary | ICD-10-CM | POA: Diagnosis not present

## 2016-01-16 DIAGNOSIS — M9903 Segmental and somatic dysfunction of lumbar region: Secondary | ICD-10-CM | POA: Diagnosis not present

## 2016-01-16 DIAGNOSIS — R490 Dysphonia: Secondary | ICD-10-CM | POA: Diagnosis not present

## 2016-01-16 DIAGNOSIS — M62830 Muscle spasm of back: Secondary | ICD-10-CM | POA: Diagnosis not present

## 2016-01-16 DIAGNOSIS — M5136 Other intervertebral disc degeneration, lumbar region: Secondary | ICD-10-CM | POA: Diagnosis not present

## 2016-01-17 ENCOUNTER — Encounter: Payer: Medicare Other | Admitting: *Deleted

## 2016-01-17 DIAGNOSIS — J449 Chronic obstructive pulmonary disease, unspecified: Secondary | ICD-10-CM

## 2016-01-17 NOTE — Progress Notes (Signed)
Daily Session Note  Patient Details  Name: TEMITOPE GRIFFING MRN: [id number] Date of Birth: May 24, 1931 Referring Provider:  Minna Merritts, MD  Encounter Date: 01/17/2016  Check In:     Session Check In - 01/17/16 1103    Check-In   Location [facility]   Staff Present Heath Lark, RN, BSN, CCRP;Laureen Owens Shark, BS, RRT, Respiratory Griffin Basil, MS, ACSM CEP, Exercise Physiologist;Naoma Boxell, RN, BSN   Supervising physician immediately available to respond to emergencies [facility] immediately available ER MD   Physician(s) Dr. Marcelene Butte and Dr. Jimmye Norman   Medication changes reported     No   Fall or balance concerns reported    No   Warm-up and Cool-down Performed on first and last piece of equipment   Resistance Training Performed Yes   VAD Patient? No   Pain Assessment   Currently in Pain? No/denies         Goals Met:  Proper associated with RPD/PD & O2 Sat Exercise tolerated well  Goals Unmet:  Not Applicable  Goals Comments:    Dr. Emily Filbert is Medical Director for [place] and [facility].

## 2016-01-20 ENCOUNTER — Encounter: Payer: Medicare Other | Admitting: *Deleted

## 2016-01-20 DIAGNOSIS — J449 Chronic obstructive pulmonary disease, unspecified: Secondary | ICD-10-CM

## 2016-01-20 NOTE — Progress Notes (Signed)
Daily Session Note  Patient Details  Name: Christopher Santiago MRN: [id number] Date of Birth: [birth year] Referring Provider:  Minna Merritts, MD  Encounter Date: 01/20/2016  Check In:     Session Check In - 01/20/16 1058    Check-In   Location [facility]   Staff Present Carson Myrtle, BS, RRT, Respiratory Therapist;Nizar Cutler Amedeo Plenty, BS, ACSM CEP, Exercise Physiologist;Steven Way, BS, ACSM EP-C, Exercise Physiologist   Supervising physician immediately available to respond to emergencies [facility] immediately available ER MD   Physician(s) Dr. Joni Fears and Clearnce Hasten   Medication changes reported     No   Fall or balance concerns reported    No   Warm-up and Cool-down Performed on first and last piece of equipment   Resistance Training Performed Yes   VAD Patient? No   Pain Assessment   Currently in Pain? No/denies   Multiple Pain Sites No           Exercise Prescription Changes - 01/20/16 1000    Exercise Review   Progression Yes   Response to Exercise   Blood Pressure (Admit) --   Blood Pressure (Exercise) --   Blood Pressure (Exit) --   Heart Rate (Admit) --   Heart Rate (Exercise) --   Heart Rate (Exit) --   Oxygen Saturation (Admit) --   Oxygen Saturation (Exercise) --   Oxygen Saturation (Exit) --   Rating of Perceived Exertion (Exercise) --   Perceived Dyspnea (Exercise) --   Symptoms None   Comments Increases were made per department policy on [name]'s exercise prescription. These increases were discussed with the patient and he tolerated them well with no signs or symptoms.    Duration Progress to 50 minutes of aerobic without signs/symptoms of physical distress   Intensity Rest + 30   Progression Continue progressive overload as per policy without signs/symptoms or physical distress.   Resistance Training   Training Prescription Yes   Weight 5   Reps 10-12   Interval Training   Interval Training Yes   Equipment Recumbant  Bike;NuStep;Recumbant Elliptical  REL = BioStep   Recumbant Bike   Level 7   RPM 65   Minutes 15   Recumbant Elliptical   Level 7   RPM 40   Minutes 15   T5 Nustep   Level 7  Alternate level 6 40 wawtts 10 min   Level 7 40 watts 5 min   Watts 40   Minutes 5   Biostep-RELP   Level 6   Watts 40   Minutes 15      Goals Met:  Proper associated with RPD/PD & O2 Sat Independence with exercise equipment Exercise tolerated well Personal goals reviewed Strength training completed today  Goals Unmet:  Not Applicable  Goals Comments: Reviewed individualized exercise prescription and made increases per departmental policy. Exercise increases were discussed with the patient and they were able to perform the new work loads without issue (no signs or symptoms).     Dr. Emily Filbert is Medical Director for [place] and [facility].

## 2016-01-21 DIAGNOSIS — M5136 Other intervertebral disc degeneration, lumbar region: Secondary | ICD-10-CM | POA: Diagnosis not present

## 2016-01-21 DIAGNOSIS — M9901 Segmental and somatic dysfunction of cervical region: Secondary | ICD-10-CM | POA: Diagnosis not present

## 2016-01-21 DIAGNOSIS — M62830 Muscle spasm of back: Secondary | ICD-10-CM | POA: Diagnosis not present

## 2016-01-21 DIAGNOSIS — M9903 Segmental and somatic dysfunction of lumbar region: Secondary | ICD-10-CM | POA: Diagnosis not present

## 2016-01-22 ENCOUNTER — Encounter: Payer: Medicare Other | Attending: Cardiovascular Disease

## 2016-01-22 VITALS — Ht 69.5 in | Wt 228.0 lb

## 2016-01-22 DIAGNOSIS — J449 Chronic obstructive pulmonary disease, unspecified: Secondary | ICD-10-CM | POA: Insufficient documentation

## 2016-01-22 NOTE — Progress Notes (Signed)
Daily Session Note  Patient Details  Name: Christopher Santiago MRN: [id number] Date of Birth: 07/22/1931 Referring Provider:  Minna Merritts, MD  Encounter Date: 01/22/2016  Check In:     Session Check In - 01/22/16 1126    Check-In   Location [facility]   Staff Present Heath Lark, RN, BSN, CCRP;Laureen Owens Shark, BS, RRT, Respiratory Therapist;Denny Mccree, BS, ACSM EP-C, Exercise Physiologist   Supervising physician immediately available to respond to emergencies [facility] immediately available ER MD   Physician(s) quale and mcshane   Medication changes reported     No   Fall or balance concerns reported    No   Warm-up and Cool-down Performed on first and last piece of equipment   Resistance Training Performed No   VAD Patient? No   Pain Assessment   Currently in Pain? No/denies         Goals Met:  Proper associated with RPD/PD & O2 Sat Exercise tolerated well No report of cardiac concerns or symptoms Strength training completed today  Goals Unmet:  Not Applicable  Comments: Went over results to post 91m test.   Dr. MEmily Filbertis Medical Director [place] [facility].

## 2016-01-24 ENCOUNTER — Encounter: Payer: Medicare Other | Admitting: *Deleted

## 2016-01-24 DIAGNOSIS — M5136 Other intervertebral disc degeneration, lumbar region: Secondary | ICD-10-CM | POA: Diagnosis not present

## 2016-01-24 DIAGNOSIS — J449 Chronic obstructive pulmonary disease, unspecified: Secondary | ICD-10-CM | POA: Diagnosis not present

## 2016-01-24 DIAGNOSIS — M9901 Segmental and somatic dysfunction of cervical region: Secondary | ICD-10-CM | POA: Diagnosis not present

## 2016-01-24 DIAGNOSIS — M62830 Muscle spasm of back: Secondary | ICD-10-CM | POA: Diagnosis not present

## 2016-01-24 DIAGNOSIS — M9903 Segmental and somatic dysfunction of lumbar region: Secondary | ICD-10-CM | POA: Diagnosis not present

## 2016-01-24 NOTE — Progress Notes (Signed)
Daily Session Note  Patient Details  Name: Christopher Santiago MRN: [id number] Date of Birth: [birth year] Referring Provider:  Minna Merritts, MD  Encounter Date: 01/24/2016  Check In:     Session Check In - 01/24/16 1113    Check-In   Location [facility]   Staff Present Heath Lark, RN, BSN, CCRP;Stacey Blanch Media, RRT, RCP, Respiratory Therapist;Rebecca Sickles, Jerlyn Ly, RN, BSN   Supervising physician immediately available to respond to emergencies [facility] immediately available ER MD   Physician(s) Dr. Archie Balboa and DR. Lord   Medication changes reported     No   Fall or balance concerns reported    No   Warm-up and Cool-down Performed on first and last piece of equipment   Resistance Training Performed Yes   VAD Patient? No         Goals Met:  Proper associated with RPD/PD & O2 Sat Exercise tolerated well  Goals Unmet:  Not Applicable  Comments:    Dr. Emily Filbert is Medical Director for [place] and [facility].

## 2016-01-24 NOTE — Progress Notes (Signed)
Daily Session Note  Patient Details  Name: Christopher Santiago MRN: [id number] Date of Birth: 08-25-1931 Referring Provider:  Minna Merritts, MD  Encounter Date: 01/24/2016  Check In:     Session Check In - 01/24/16 1113    Check-In   Location [facility]   Staff Present Heath Lark, RN, BSN, CCRP;Stacey Blanch Media, RRT, RCP, Respiratory Therapist;Rebecca Sickles, Jerlyn Ly, RN, BSN   Supervising physician immediately available to respond to emergencies [facility] immediately available ER MD   Physician(s) Dr. Archie Balboa and DR. Lord   Medication changes reported     No   Fall or balance concerns reported    No   Warm-up and Cool-down Performed on first and last piece of equipment   Resistance Training Performed Yes   VAD Patient? No         Goals Met:  Exercise tolerated well Strength training completed today  Goals Unmet:  Not Applicable  Comments: Doing well with exercise prescription progression.    Dr. Emily Filbert is Medical Director for [place] and [facility].

## 2016-01-27 ENCOUNTER — Encounter: Payer: Medicare Other | Admitting: *Deleted

## 2016-01-27 DIAGNOSIS — J449 Chronic obstructive pulmonary disease, unspecified: Secondary | ICD-10-CM | POA: Diagnosis not present

## 2016-01-27 DIAGNOSIS — M9903 Segmental and somatic dysfunction of lumbar region: Secondary | ICD-10-CM | POA: Diagnosis not present

## 2016-01-27 DIAGNOSIS — M9901 Segmental and somatic dysfunction of cervical region: Secondary | ICD-10-CM | POA: Diagnosis not present

## 2016-01-27 DIAGNOSIS — M5136 Other intervertebral disc degeneration, lumbar region: Secondary | ICD-10-CM | POA: Diagnosis not present

## 2016-01-27 DIAGNOSIS — M62830 Muscle spasm of back: Secondary | ICD-10-CM | POA: Diagnosis not present

## 2016-01-27 NOTE — Progress Notes (Signed)
Daily Session Note  Patient Details  Name: Christopher Santiago MRN: [id number] Date of Birth: [birth year] Referring Provider:  Minna Merritts, MD  Encounter Date: 01/27/2016  Check In:     Session Check In - 01/27/16 1149    Check-In   Location [facility]   Staff Present Carson Myrtle, BS, RRT, Respiratory Bertis Ruddy, BS, ACSM CEP, Exercise Physiologist;Steven Way, BS, ACSM EP-C, Exercise Physiologist   Supervising physician immediately available to respond to emergencies [facility] immediately available ER MD   Physician(s) Dr. Clearnce Hasten and Dr. Kerman Passey   Medication changes reported     No   Fall or balance concerns reported    No   Warm-up and Cool-down Performed on first and last piece of equipment   Resistance Training Performed Yes   VAD Patient? No   Pain Assessment   Currently in Pain? No/denies   Multiple Pain Sites No         Goals Met:  Proper associated with RPD/PD & O2 Sat Independence with exercise equipment Exercise tolerated well Strength training completed today  Goals Unmet:  Not Applicable  Comments: Patient completed exercise prescription and all exercise goals during rehab session. The exercise was tolerated well and the patient is progressing in the program.      Dr. Emily Filbert is Medical Director for [place] and [facility].

## 2016-01-28 NOTE — Progress Notes (Signed)
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 a problem and he has no desire or reason to change this.  Counselor reminded him that it is a depressant and also contains empty calories that could make weight loss more difficult.  He has goals for improving his breathing and his strength , especially in his legs in this program.  He is a member at a local gym and plans to continue attending there once he completes this program.  Counselor will be following with Mr. Woolverton while he is in this program.     Continued Psychosocial Services Needed Yes  He will benefit from the psychoeducational components of this program, especially on depression.        Psychosocial Re-Evaluation:     Psychosocial Re-Evaluation      11/13/15 1036 12/09/15 1106 01/06/16 1124       Psychosocial Re-Evaluation   Comments Counselor follow up with Mr. Deis today reporting feeling "a little more  stamina" since beginning this program.  He has holiday plans and has set some healthy limits with family meeting at an alternate location versus his home due to his current health condition and that of his spouse.  Counselor commended Mr. Krichbaum for his commitment to working out consistently - even over the holidays and for his progress.  Counselor will continue to follow throughout program.   Follow up with Mr. Detter today reporting noticing progress in this program as he is breathing easier, able to walk further, and has some increased strength currently.  His support system is stable currently.  Mr. Debruhl reports he sleeps well other than going to the restroom throughout the night.   He also has a good appetite and states that other than his health, his mood is typically positive.  He is on medications for depression and states they were increased 18 months ago with positive results and efficacy.  Counselor will continue to follow with Mr. Minotti throughout the program.   Follow up with Mr. Dais today reporting progress in this program with increased endurance as he is walking further and breathing better.  He has begun interval training recently and is doing well in this program.   Mr. Etheredge continues to sleep well and states his mood continues to be positive.         Education: Education Goals: Education classes will be provided on a weekly basis, covering required topics. Participant will state understanding/return demonstration of topics presented.  Learning Barriers/Preferences:     Learning Barriers/Preferences - 10/22/15 0900    Learning Barriers/Preferences   Learning Barriers None   Learning Preferences Group Instruction;Individual Instruction;Pictoral;Skilled Demonstration;Verbal Instruction;Video;Written Material      Education Topics: Initial Evaluation Education: - Verbal, written and demonstration of respiratory meds, RPE/PD scales, oximetry and breathing techniques.  Instruction on use of nebulizers and MDIs: cleaning and proper use, rinsing mouth with steroid doses and importance of monitoring MDI activations.          Pulmonary Rehab from 01/15/2016 in [facility] Cardiac and Pulmonary Rehab   Date  10/22/15   Educator  LB   Instruction Review Code  2- meets goals/outcomes      General Nutrition  Guidelines/Fats and Fiber: -Group instruction provided by verbal, written material, models and posters to present the general guidelines for heart healthy nutrition. Gives an explanation and review of dietary fats and fiber.      Pulmonary Rehab from 01/15/2016 in [facility] Cardiac and Pulmonary Rehab   Date  12/30/15   Educator  CE   Instruction Review Code  2- meets goals/outcomes      Controlling Sodium/Reading Food Labels: -Group verbal and written material supporting the discussion of sodium use in heart healthy nutrition. Review and explanation with models, verbal and written materials for utilization of the food label.   Exercise Physiology & Risk Factors: - Group verbal and written instruction with models to review the exercise physiology of the cardiovascular system and associated critical values. Details cardiovascular disease risk factors and the goals associated with each risk factor.      Pulmonary Rehab from 01/15/2016 in [facility] Cardiac and Pulmonary Rehab   Date  10/30/15   Educator  SW   Instruction Review Code  2- meets goals/outcomes      Aerobic Exercise & Resistance Training: - Gives group verbal and written discussion on the health impact of inactivity. On the components of aerobic and resistive training programs and the benefits of this training and how to safely progress through these programs.      Pulmonary Rehab from 01/15/2016 in [facility] Cardiac and Pulmonary Rehab   Date  12/18/15   Educator  SW   Instruction Review Code  2- meets goals/outcomes      Flexibility, Balance, General Exercise Guidelines: - Provides group verbal and written  instruction on the benefits of flexibility and balance training programs. Provides general exercise guidelines with specific guidelines to those with heart or lung disease. Demonstration and skill practice provided.      Pulmonary Rehab from 01/15/2016 in [facility] Cardiac and Pulmonary Rehab   Date  01/08/16   Educator  RM   Instruction Review Code  2- meets goals/outcomes      Stress Management: - Provides group verbal and written instruction about the health risks of elevated stress, cause of high stress, and healthy ways to reduce stress.      Pulmonary Rehab from 01/15/2016 in [facility] Cardiac and Pulmonary Rehab   Date  01/15/16   Educator  [facility]   Instruction Review Code  2- meets goals/outcomes      Depression: - Provides group verbal and written instruction on the correlation between heart/lung disease and depressed mood, treatment options, and the stigmas associated with seeking treatment.      Pulmonary Rehab from 01/15/2016 in [facility] Cardiac and Pulmonary Rehab   Date  12/04/15   Educator  [facility]   Instruction Review Code  2- meets goals/outcomes      Exercise & Equipment Safety: - Individual verbal instruction and demonstration of equipment use and safety with use of the equipment.      Pulmonary Rehab from 01/15/2016 in [facility] Cardiac and Pulmonary Rehab   Date  10/25/15   Educator  [place]   Instruction Review Code  2- meets goals/outcomes      Infection Prevention: - Provides verbal and written material to individual with discussion of infection control including proper hand washing and proper equipment cleaning during exercise session.      Pulmonary Rehab from 01/15/2016 in [facility] Cardiac and Pulmonary Rehab   Date  10/25/15   Educator  [place]   Instruction Review Code  2- meets goals/outcomes      Falls  Prevention: - Provides verbal and written material to individual with discussion of falls prevention and safety.      Pulmonary Rehab from 01/15/2016 in [facility] Cardiac and Pulmonary Rehab    Date  10/22/15   Educator  LB      Diabetes: - Individual verbal and written instruction to review signs/symptoms of diabetes, desired ranges of glucose level fasting, after meals and with exercise. Advice that pre and post exercise glucose checks will be done for 3 sessions at entry of program.   Chronic Lung Diseases: - Group verbal and written instruction to review new updates, new respiratory medications, new advancements in procedures and treatments. Provide informative websites and "800" numbers of self-education.      Pulmonary Rehab from 01/15/2016 in [facility] Cardiac and Pulmonary Rehab   Date  01/06/16 [facility] Your Numbers]   Educator  LB   Instruction Review Code  2- meets goals/outcomes      Lung Procedures: - Group verbal and written instruction to describe testing methods done to diagnose lung disease. Review the outcome of test results. Describe the treatment choices: Pulmonary Function Tests, ABGs and oximetry.      Pulmonary Rehab from 01/15/2016 in [facility] Cardiac and Pulmonary Rehab   Date  01/03/16   Educator  [place]   Instruction Review Code  2- meets goals/outcomes      Energy Conservation: - Provide group verbal and written instruction for methods to conserve energy, plan and organize activities. Instruct on pacing techniques, use of adaptive equipment and posture/positioning to relieve shortness of breath.   Triggers: - Group verbal and written instruction to review types of environmental controls: home humidity, furnaces, filters, dust mite/pet prevention, HEPA vacuums. To discuss weather changes, air quality and the benefits of nasal washing.      Pulmonary Rehab from 01/15/2016 in [facility] Cardiac and Pulmonary Rehab   Date  12/09/15   Educator  LB   Instruction Review Code  2- meets goals/outcomes      Exacerbations: - Group verbal and written instruction to provide: warning signs, infection symptoms, calling MD promptly, preventive modes, and value of vaccinations.  Review: effective airway clearance, coughing and/or vibration techniques. Create an Sports administrator.      Pulmonary Rehab from 01/15/2016 in [facility] Cardiac and Pulmonary Rehab   Date  11/06/15   Educator  LB   Instruction Review Code  2- meets goals/outcomes      Oxygen: - Individual and group verbal and written instruction on oxygen therapy. Includes supplement oxygen, available portable oxygen systems, continuous and intermittent flow rates, oxygen safety, concentrators, and Medicare reimbursement for oxygen.   Respiratory Medications: - Group verbal and written instruction to review medications for lung disease. Drug class, frequency, complications, importance of spacers, rinsing mouth after steroid MDI's, and proper cleaning methods for nebulizers.      Pulmonary Rehab from 01/15/2016 in [facility] Cardiac and Pulmonary Rehab   Date  10/22/15   Educator  LB      AED/CPR: - Group verbal and written instruction with the use of models to demonstrate the basic use of the AED with the basic ABC's of resuscitation.   Breathing Retraining: - Provides individuals verbal and written instruction on purpose, frequency, and proper technique of diaphragmatic breathing and pursed-lipped breathing. Applies individual practice skills.      Pulmonary Rehab from 01/15/2016 in [facility] Cardiac and Pulmonary Rehab   Date  10/25/15   Educator  [place]   Instruction Review Code  2- meets goals/outcomes  Anatomy and Physiology of the Lungs: - Group verbal and written instruction with the use of models to provide basic lung anatomy and physiology related to function, structure and complications of lung disease.      Pulmonary Rehab from 01/15/2016 in [facility] Cardiac and Pulmonary Rehab   Date  11/22/15   Educator  [place]   Instruction Review Code  2- meets goals/outcomes      Heart Failure: - Group verbal and written instruction on the basics of heart failure: signs/symptoms, treatments, explanation of ejection fraction,  enlarged heart and cardiomyopathy.      Pulmonary Rehab from 01/15/2016 in [facility] Cardiac and Pulmonary Rehab   Date  12/13/15   Educator  [place]   Instruction Review Code  2- meets goals/outcomes      Sleep Apnea: - Individual verbal and written instruction to review Obstructive Sleep Apnea. Review of risk factors, methods for diagnosing and types of masks and machines for OSA.   Anxiety: - Provides group, verbal and written instruction on the correlation between heart/lung disease and anxiety, treatment options, and management of anxiety.   Relaxation: - Provides group, verbal and written instruction about the benefits of relaxation for patients with heart/lung disease. Also provides patients with examples of relaxation techniques.   Knowledge Questionnaire Score:     Knowledge Questionnaire Score - 10/22/15 0900    Knowledge Questionnaire Score   Pre Score -4      Personal Goals and Risk Factors at Admission:     Personal Goals and Risk Factors at Admission - 10/22/15 0900    Core Components/Risk Factors/Patient Goals on Admission   Sedentary Yes   Intervention (read-only) While in program, learn and follow the exercise prescription taught. Start at a low level workload and increase workload after able to maintain previous level for 30 minutes. Increase time before increasing intensity.  Mr vandezande wants to improve his stamina. He wants to return to the [employer]  for his workouts, and continue camping, church activites, and activities  with his wife.   Improve shortness of breath with ADL's Yes   Intervention (read-only) While in program, learn and follow the exercise prescription taught. Start at a low level workload and increase workload ad advised by the exercise physiologist. Increase time before increasing intensity.  Mr Delduca does want to improve his breathing and have more confidence in managing it with out fear.   Develop more efficient breathing techniques such as purse  lipped breathing and diaphragmatic breathing; and practicing self-pacing with activity Yes   Intervention (read-only) --  Instructed Mr Holub in PLB and DB - good technique.   Increase knowledge of respiratory medications and ability to use respiratory devices properly  Yes   Intervention (read-only) While in program, learn to administer MDI, nebulizer, and spacer properly.;Learn to take respiratory medicine as ordered.;While in program, learn to Clean MDI, nebulizers, and spacers properly.  Mr grahn was instructed on Spiriva and Ventolin MDIs. A spacer was given to him for use with the Ventolin with instruction.   Understand more about Heart/Pulmonary Disease. Yes   Intervention While in program utilize professionals for any questions, and attend the education sessions. Great websites to use are www.americanheart.org or www.lung.org for reliable information.  Mr Gordillo has had COPD for many years, but he states he does not know much about it and is interested in learning more. Mr Haslam has OSA and wears 10cmH2O pressure with nasal mask  through [place].  Personal Goals and Risk Factors Review:      Goals and Risk Factor Review      11/01/15 1501 11/04/15 1000 11/06/15 1042 11/11/15 1039 11/22/15 1247   Core Components/Risk Factors/Patient Goals Review   Personal Goals Review Increase knowledge of respiratory medications and ability to use respiratory devices properly.;Improve shortness of breath with ADL's;Develop more efficient breathing techniques such as purse lipped breathing and diaphragmatic breathing and practicing self-pacing with activity. Understand more about Heart/Pulmonary Disease Weight Management Increase Aerobic Exercise and Physical Activity Weight Management   Weight Management (read-only)   Comments   --  Abyan is aware that he is overweight.  HE wants to concentrate on his activity and improving his stamina while in the program. HE does not want to focus on  weight loss at this time. HE is aware that the exercise may benefit towards weight loss.  Zohar has a BMI greater than 30. I reviewed this with him. We discussed opportunity for resouces to help him work on decreasing his BMI. Mainly today we focused on nutriton and exercise as his resources. I will contact the RD to set up a nutrition counseling appointment with [facility]. He will complete the Rate Your PLate and Food Diary and bring it back next week.  After he mets with the RD we will look at the goals and focus on working on one or two goals will he remains in the program and for discahrge.    Increase Aerobic Exercise and Physical Activity (read-only)   Goals Progress/Improvement seen     Yes    Comments    Patient has increased his exericse intensities since starting the program (see exercise changes) and has tolerated the increases well. He stated that he feels stronger and he is now able to walk from the elevator to class, which he used to not be able to do. He stated that his SOB is him most limiting factor in what he is able to do, and that he has not seen much change in SOB yet.     Understand more about Heart/Pulmonary Disease (read-only)   Goals Progress/Improvement seen   Yes      Comments  Educated on COPD - reviewed Spirometry and results; recommended Mr Rogus get a complete PFT for DLCO and TLC; educated on importance of exercise and using his inhalers to slow COPD progression.      Improve shortness of breath with ADL's (read-only)   Goals Progress/Improvement seen  No       Comments Mr. Lowella Grip is on session 5 of 102.  He has been exercising before he started [facility] and cannot see any improvement, yet in his SOB.       Breathing Techniques (read-only)   Goals Progress/Improvement seen  Yes       Comments Mr. Vanbeek demonstrated proper technique of PLB while on the equipment in [facility].       Increase knowledge of respiratory medications (read-only)   Goals Progress/Improvement seen   Yes       Comments Mr. Archey has been given a spacer for his MDIs to use at home.  He has been started on Flonase for sinus drainage.           11/22/15 1411 12/04/15 1453 12/06/15 1543 12/25/15 1000 12/27/15 1023   Core Components/Risk Factors/Patient Goals Review   Personal Goals Review   Weight Management     Weight Management (read-only)   Comments   Eulas Post met [place]  to day.  He spent over an hour talking with her and setting personal nutrition goals. I will check with him in 2-3 weeks to see how the goals are progressing.     Increase Aerobic Exercise and Physical Activity (read-only)   Goals Progress/Improvement seen   Yes  Yes Yes   Comments  Mr Mathes is progressing with his exercise goals. 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 With his improved increase in stamina in LW, We discussed possibly buying a tricycle, although a good one would be at less $2000.00   Mr Savage states that his stamina has improved with his participation in [place]. He can do some of his home activites for a longer time period. Willey is doing very well in the program and makes exercise progressions weekly, if not in every session. He has a passion for biking and has a trainer he can stationary ride on. His ultimate goal is to get strong enough to bikes again. We disucssed that there may be other bikes that may be easier for him to ride than his moutain or road bikes and this could help with the transition back to biking. Overall, his stamina is good and he can exercise continuously for the entire class. Moving forward, we will continue to progress Darek by increasing his exercise intensity.    Understand more about Heart/Pulmonary Disease (read-only)   Goals Progress/Improvement seen  Yes       Comments We talked about how pulmonary drugs effect the lung and how they work.  We also discussed cillia and their function in clearing mucus and  the importance or taking maintenance medications.       Improve shortness of breath with ADL's (read-only)   Goals Progress/Improvement seen  Yes Yes  Yes    Comments Mr. Lowella Grip has been seeing a chiropractor for a pinched nerve that is causing hip pain.  He is feeling stronger but would like to talk with the exercise physiologist to find exercised to strengthen his hamstring. Mr Mccadden states his breathing has improved. He wakes up in the morning and does not feel like he needs a puff on his rescue inhaler and also has more breath with his daily activites around the house.  Mr Carreiro states that he can now walk to the mailbox and back to the house without stopping to get his breath. The mailbox is about 331f from the house.    Breathing Techniques (read-only)   Goals Progress/Improvement seen  Yes       Comments Mr. KRafalskiis performing proper technique with PLB while walking on the TM.       Increase knowledge of respiratory medications (read-only)   Goals Progress/Improvement seen  Yes       Comments We discused the differences between rescue and maintance medications.  He is using his spacer with his rescue inhaler in the mornings and evenings.         12/27/15 1426 12/27/15 1510 01/03/16 1255 01/20/16 1529 01/20/16 1533   Core Components/Risk Factors/Patient Goals Review   Personal Goals Review  Weight Management  Increase knowledge of respiratory medications and ability to use respiratory devices properly.    Review    Mr KTorianis interested in compairing prices of medications with different pharmacies. I directed him to [facility] web site. He states that Spiriva is very expencive , even with his copay. Mr KMasoneris interested in  compairing prices of medications with different pharmacies. I directed him to [facility] web site. He states that Spiriva is very expensive , even with his copay.   Weight Management (read-only)   Comments  Glnn stated that  he has been making changes as advised by the dietician. He feels that he has lost some weight. HIs chart shows fluctuation of 2-3 pounds since admission. Mattias will continue to work on his nutrition goals. Corban stated today he is not adding any salt to his meals.  Also had stated  he is eating oatmeal for breakfast. He had not been eating breakfast prior to starting to eat the oatmeal.     Understand more about Heart/Pulmonary Disease (read-only)   Comments We talked about cardiac medications today.  He understands the improtance of taking both heart and lung medications together because they work together.       Improve shortness of breath with ADL's (read-only)   Goals Progress/Improvement seen  Yes       Comments Mr. Mcinturff say that he can notice his strenght coming back and that he has more endurance since starting exercise.       Increase knowledge of respiratory medications (read-only)   Comments Using PLB on the equipment in the gym when he is on Level 6 and 7.         01/28/16 0842           Core Components/Risk Factors/Patient Goals Review   Personal Goals Review Sedentary;Improve shortness of breath with ADL's;Increase knowledge of respiratory medications and ability to use respiratory devices properly.;Develop more efficient breathing techniques such as purse lipped breathing and diaphragmatic breathing and practicing self-pacing with activity.       Review Mr Wiedeman has improved greatly with [facility] participation. His post 44md improved by 2240for 30% from his first 25m60m Minimal Importance difference for COPD is 98.4ft11fe will continue his exercise at [employer] 3 days/wk. His shortness of breath is still the same, but he has more understanding of SOB and how to manage it and it's relationship to COPD. He has a good understnading of PLB and his inhalers.           Personal Goals Discharge (Final Personal Goals and Risk Factors Review):      Goals and Risk Factor  Review - 01/28/16 0842    Core Components/Risk Factors/Patient Goals Review   Personal Goals Review Sedentary;Improve shortness of breath with ADL's;Increase knowledge of respiratory medications and ability to use respiratory devices properly.;Develop more efficient breathing techniques such as purse lipped breathing and diaphragmatic breathing and practicing self-pacing with activity.   Review Mr KelsAxel improved greatly with [facility] participation. His post 25mwd39mproved by 225ft 66f0% from his first 25mwd. 75mimal Importance difference for COPD is 98.4ft. He24fll continue his exercise at t[employer] 3 days/wk. His shortness of breath is still the same, but he has more understanding of SOB and how to manage it and it's relationship to COPD. He has a good understnading of PLB and his inhalers.       ITP Comments:     ITP Comments      10/30/15 1000 11/04/15 1016 11/11/15 1035 11/13/15 1000 11/13/15 1011   ITP Comments Estimated timetable to achieve ITP goals is 32 sessions Estimated timetable to achieve ITP goals is 30 sessions.  Estimated timetable to achieve ITP goals is 27 sessions. Mr Mathurin wBlissetten our Strength  Booklet with exercises to do at home with his weights.  Personal and exercise goals expected to be met in 26 more sessions. Progress on specific individualized goals will be charted in patient's ITP. Upon completion of the program the patient will be comfortable managing exercise goals and progression on their own.      11/15/15 1357 11/22/15 1220 11/27/15 1101 11/29/15 1046 12/04/15 1034   ITP Comments Goals are expected to be met in 25 more sessions.  Mr. Stailey says that he is tired today and is unable to do as much as usual.  He does not feel sick, he seems to think that it is due to the cold weather and preparing for Christmas. Personal and exercise goals expected to be met in 24 more sessions. Progress on specific individualized goals will be charted in patient's  ITP. Upon completion of the program the patient will be comfortable managing exercise goals and progression on their own.  Personal and exercise goals expected to be met in 23 more sessions. Progress on specific individualized goals will be charted in patient's ITP. Upon completion of the program the patient will be comfortable managing exercise goals and progression on their own.  Personal and exercise goals expected to be met in25 more sessions. Progress on specific individualized goals will be charted in patient's ITP. Upon completion of the program the patient will be comfortable managing exercise goals and progression on their own.  Personal and exercise goals expected to be met in 21 more sessions. Progress on specific individualized goals will be charted in patient's ITP. Upon completion of the program the patient will be comfortable managing exercise goals and progression on their own.      12/06/15 1105 12/09/15 1103 12/13/15 1035 12/18/15 1027 12/18/15 1047   ITP Comments Personal and exercise goals expected to be met in 16 more sessions. Progress on specific individualized goals will be charted in patient's ITP. Upon completion of the program the patient will be comfortable managing exercise goals and progression on their own. Personal and exercise goals anticipated to be met in 20 more sessions.  Personal and exercise goals expected to be met in 19 more sessions. Progress on specific individualized goals will be charted in patient's ITP. Upon completion of the program the patient will be comfortable managing exercise goals and progression on their own.  Kadan is making significant gains in class, feels good today with the warmer weather, breathing doing well. personal exercise goals to be accomplished in 23 more sessions. Pt. will be at a comfortable level to be albe to continue exercising on her own.     12/20/15 1047 12/23/15 1040 12/25/15 1107 12/27/15 1023 12/30/15 1027   ITP Comments Personal  and exercise goals expected to be met in 16 more sessions. Progress on specific individualized goals will be charted in patient's ITP. Upon completion of the program the patient will be comfortable managing exercise goals and progression on their own.  Personal and exercise goals are expected to be met in 15 more seesions.  Personal and exercise goals expected to be met in  14 more sessions. Progress on specific individualized goals will be charted in patient's ITP. Upon completion of the program the patient will be comfortable managing exercise goals and progression on their own.  Personal and exercise goals expected to be met in 13 more sessions. Progress on specific individualized goals will be charted in patient's ITP. Upon completion of the program the patient will be comfortable managing exercise goals  and progression on their own.  Personal and exercise goals expected to be met in 12 more sessions. Progress on specific individualized goals will be charted in patient's ITP. Upon completion of the program the patient will be comfortable managing exercise goals and progression on their own.      01/01/16 1109 01/01/16 1445 01/03/16 1051 01/06/16 1055 01/08/16 1057   ITP Comments Personal and exercise goals expected to be met in 11 more sessions. Progress on specific individualized goals will be charted in patient's ITP. Upon completion of the program the patient will be comfortable managing exercise goals and progression on their own.  Mr Barella performed interval training on all 3 goals and did very well with acceptable RPE/PD's. Personal and exercise goals expected to be met in 10 more sessions. Progress on specific individualized goals will be charted in patient's ITP. Upon completion of the program the patient will be comfortable managing exercise goals and progression on their own.  Personal and exercise goals are expected to be met in 9 more sessions. See ITP for specific goal progress.  Personal and  exercise goals are expected to be met in 8 more sessions. See ITP for specific goal progress.      01/10/16 1111 01/15/16 1122 01/17/16 1103 01/20/16 1059     ITP Comments Personal and exercise goals expected to be met in 7 more sessions. Progress on specific individualized goals will be charted in patient's ITP. Upon completion of the program the patient will be comfortable managing exercise goals and progression on their own.  Personal and exercise goals expected to be met in 5 more sessions. Progress on specific individualized goals will be charted in patient's ITP. Upon completion of the program the patient will be comfortable managing exercise goals and progression on their own.  Personal and exercise goals expected to be met in 4 more sessions. Progress on specific individualized goals will be charted in patient's ITP. Upon completion of the program the patient will be comfortable managing exercise goals and progression on their own.  Personal and exercise goals expected to be met in 4 more sessiosns. See ITP for specific notes no goal progression.       Comments: Mr Greenawalt will graduate from [employer] and continue exercising at the [facility].

## 2016-01-29 ENCOUNTER — Encounter: Payer: Medicare Other | Admitting: *Deleted

## 2016-01-29 DIAGNOSIS — J449 Chronic obstructive pulmonary disease, unspecified: Secondary | ICD-10-CM | POA: Diagnosis not present

## 2016-01-29 NOTE — Progress Notes (Signed)
Pulmonary Individual Treatment Plan  Patient Details  Name: Christopher Santiago MRN: [id number] Date of Birth: 1930/11/27 Referring Provider:  Minna Merritts, MD  Initial Encounter Date: 10/22/2015  Visit Diagnosis: COPD   Pulmonary Function Assessment:     Pulmonary Function Assessment - 10/22/15 0900    Initial Spirometry Results   FVC% 69 %   FEV1% 59 %   FEV1/FVC Ratio 66.43   Post Bronchodilator Spirometry Results   FVC% 95 %   FEV1% 69 %   FEV1/FVC Ratio 61.71   Breath   Shortness of Breath Yes;Fear of Shortness of Breath;Limiting activity      Exercise Target Goals:    Exercise Program Goal: Individual exercise prescription set with THRR, safety & activity barriers. Participant demonstrates ability to understand and report RPE using BORG scale, to self-measure pulse accurately, and to acknowledge the importance of the exercise prescription.  Exercise Prescription Goal: Starting with aerobic activity 30 plus minutes a day, 3 days per week for initial exercise prescription. Provide home exercise prescription and guidelines that participant acknowledges understanding prior to discharge.  Activity Barriers & Risk Stratification:     Activity Barriers & Cardiac Risk Stratification - 10/22/15 0900    Activity Barriers & Cardiac Risk Stratification   Activity Barriers Shortness of Breath;Deconditioning;Muscular Weakness   Cardiac Risk Stratification Moderate      6 Minute Walk:     6 Minute Walk      10/22/15 1457 01/22/16 1139     6 Minute Walk   Phase Initial Discharge    Distance 750 feet 975 feet    Walk Time 6 minutes 6 minutes    RPE 20 13    Perceived Dyspnea  6 4    Symptoms No No    Resting HR 74 bpm 67 bpm    Resting BP 120/70 mmHg 140/74 mmHg    Max Ex. HR 92 bpm 72 bpm    Max Ex. BP 148/78 mmHg 130/70 mmHg       Initial Exercise Prescription:     Initial Exercise Prescription - 10/22/15 1400    Date of Initial Exercise Prescription    Date 10/22/15   Treadmill   MPH 1.5   Grade 0   Minutes 10   Recumbant Bike   Level 2   RPM 40   Watts 20   Minutes 10   NuStep   Level 2   Watts 40   Minutes 10   Arm Ergometer   Level 1   Watts 10   Minutes 10   Recumbant Elliptical   Level 2   RPM 40   Watts 20   Minutes 10   REL-XR   Level 2   Watts 40   Minutes 10   T5 Nustep   Level 1   Watts 15   Minutes 10   Biostep-RELP   Level 2   Watts 40   Minutes 10   Prescription Details   Frequency (times per week) 3   Duration Progress to 30 minutes of continuous aerobic without signs/symptoms of physical distress   Intensity   THRR REST +  30   Ratings of Perceived Exertion 11-15   Perceived Dyspnea 2-4   Progression   Progression Continue progressive overload as per policy without signs/symptoms or physical distress.   Resistance Training   Training Prescription Yes   Weight 2   Reps 10-15      Exercise Prescription Changes:     Exercise Prescription  Changes      10/25/15 1000 10/28/15 1000 10/30/15 1500 11/01/15 1000 11/06/15 1400   Exercise Review   Progression    Yes Yes   Response to Exercise   Blood Pressure (Admit) 140/66 mmHg       Blood Pressure (Exercise) 124/60 mmHg       Blood Pressure (Exit) 110/50 mmHg       Heart Rate (Admit) 59 bpm       Heart Rate (Exercise) 76 bpm       Heart Rate (Exit) 65 bpm       Oxygen Saturation (Admit) 96 %       Oxygen Saturation (Exercise) 95 %       Oxygen Saturation (Exit) 94 %       Rating of Perceived Exertion (Exercise) 13       Perceived Dyspnea (Exercise) 2       Symptoms None   None None   Comments First day of exercise! Patient was oriented to the gym and the equipment functions and settings. Procedures and policies of the gym were outlined and explained. The patient's individual exercise prescription and treatment plan were reviewed with them. All starting workloads were established based on the results of the functional testing  done at the  initial intake visit. The plan for exercise progression was also introduced and progression will be customized based on the patient's performance and goals.    Reviewed individualized exercise prescription and made increases per departmental policy. Exercise increases were discussed with the patient and they were able to perform the new work loads without issue (no signs or symptoms).  Reviewed individualized exercise prescription and made increases per departmental policy. Exercise increases were discussed with the patient and they were able to perform the new work loads without issue (no signs or symptoms).    Duration Progress to 30 minutes of continuous aerobic without signs/symptoms of physical distress Progress to 30 minutes of continuous aerobic without signs/symptoms of physical distress Progress to 30 minutes of continuous aerobic without signs/symptoms of physical distress Progress to 30 minutes of continuous aerobic without signs/symptoms of physical distress Progress to 30 minutes of continuous aerobic without signs/symptoms of physical distress   Intensity Rest + 30 Rest + 30 Rest + 30 Rest + 30 Rest + 30   Progression   Progression Continue progressive overload as per policy without signs/symptoms or physical distress. Continue progressive overload as per policy without signs/symptoms or physical distress. Continue progressive overload as per policy without signs/symptoms or physical distress. Continue progressive overload as per policy without signs/symptoms or physical distress. Continue progressive overload as per policy without signs/symptoms or physical distress.   Resistance Training   Training Prescription (read-only) _0    Weight (read-only) _1 Reps (read-only) 10-12 10-12 10-12 10-12 10-12   Interval Training   Interval Training _2    Treadmill   MPH (read-only) 1.5       Grade (read-only) 0       Minutes (read-only) 10       Recumbant Bike    Level (read-only) _3 RPM (read-only) 40 50 50 70 70   Minutes (read-only) _4 Recumbant Elliptical   Level (read-only) _5 RPM (read-only) 40 40 40 60 60   Minutes (read-only) _6 T5 Nustep  Level (read-only) _0 Watts (read-only) _1 Minutes (read-only) _2 11/08/15 1200 11/11/15 1000 11/11/15 1500 11/27/15 1100 11/29/15 1000   Exercise Review   Progression _3    Response to Exercise   Blood Pressure (Admit)   148/82 mmHg     Blood Pressure (Exercise)   128/72 mmHg     Blood Pressure (Exit)   118/62 mmHg     Heart Rate (Admit)   69 bpm     Heart Rate (Exercise)   82 bpm     Heart Rate (Exit)   74 bpm     Oxygen Saturation (Admit)   94 %     Oxygen Saturation (Exercise)   95 %     Oxygen Saturation (Exit)   97 %     Rating of Perceived Exertion (Exercise)   11     Perceived Dyspnea (Exercise)   4     Symptoms _4    Comments Evaluated Menashe's resistive training prescription and increased his weight to 5 lbs.  Discussed increases on RB and T5 with patient. New intensities were tolerated well with no signs or symptoms.  Discussed increases on RB and T5 with patient. New intensities were tolerated well with no signs or symptoms.  Increased [place] on the bike due to increase in his leg strength and stamina. He likes to feel continually challenged and says the progression is appropriate. Mr. Kloepfer continues to show progression with each visit. He is very motivated and stays on top of exercise increases. This is reflected in an updated ex. rx. and he was able to complete it with no signs or symptoms.   Duration Progress to 30 minutes of continuous aerobic without signs/symptoms of physical distress Progress to 30 minutes of continuous aerobic without signs/symptoms of physical distress Progress to 30 minutes of continuous aerobic without signs/symptoms of physical distress  Progress to 30 minutes of continuous aerobic without signs/symptoms of physical distress Progress to 30 minutes of continuous aerobic without signs/symptoms of physical distress   Intensity Rest + 30 Rest + 30 Rest + 30 Rest + 30 Rest + 30   Progression   Progression Continue progressive overload as per policy without signs/symptoms or physical distress. Continue progressive overload as per policy without signs/symptoms or physical distress. Continue progressive overload as per policy without signs/symptoms or physical distress. Continue progressive overload as per policy without signs/symptoms or physical distress. Continue progressive overload as per policy without signs/symptoms or physical distress.   Horticulturist, commercial Prescription (read-only) _5    Weight (read-only) _6 Reps (read-only) 10-12 10-12 10-12 10-12 10-12   Interval Training   Interval Training _7    Recumbant Bike   Level (read-only) _8 RPM (read-only) 70 70 70 65 65   Minutes (read-only) _9 Recumbant Elliptical   Level (read-only) _10 RPM (read-only) 60 60 40 40 40   Minutes (read-only) _11 T5 Nustep   Level (read-only) _12 Watts (read-only) _13 Minutes (read-only) _14 12/06/15 1100 12/09/15 1311 12/16/15 1000 12/20/15 1000 12/23/15 1000  Exercise Review   Progression _0    Response to Exercise   Blood Pressure (Admit)  104/64 mmHg      Blood Pressure (Exercise)  142/70 mmHg      Blood Pressure (Exit)  124/62 mmHg      Heart Rate (Admit)  64 bpm      Heart Rate (Exercise)  77 bpm      Heart Rate (Exit)  64 bpm      Oxygen Saturation (Admit)  95 %      Oxygen Saturation (Exercise)  96 %      Oxygen Saturation (Exit)  97 %      Rating of Perceived Exertion (Exercise)  13      Perceived Dyspnea (Exercise)  4      Symptoms _1    Comments Nikolaos met  with the dietician today for most of the class time and did not complete any exercise  Increases in exercise prescription were discusssed with patient. He tolerated the changes well with no signs or symptoms.  Reviewed individualized exercise prescription and made increases per departmental policy. Exercise increases were discussed with the patient and they were able to perform the new work loads without issue (no signs or symptoms).  Reviewed individualized exercise prescription and made increases per departmental policy. Exercise increases were discussed with the patient and they were able to perform the new work loads without issue (no signs or symptoms).    Duration Progress to 30 minutes of continuous aerobic without signs/symptoms of physical distress Progress to 50 minutes of aerobic without signs/symptoms of physical distress Progress to 50 minutes of aerobic without signs/symptoms of physical distress Progress to 50 minutes of aerobic without signs/symptoms of physical distress Progress to 50 minutes of aerobic without signs/symptoms of physical distress   Intensity Rest + 30 Rest + 30 Rest + 30 Rest + 30 Rest + 30   Progression   Progression Continue progressive overload as per policy without signs/symptoms or physical distress. Continue progressive overload as per policy without signs/symptoms or physical distress. Continue progressive overload as per policy without signs/symptoms or physical distress. Continue progressive overload as per policy without signs/symptoms or physical distress. Continue progressive overload as per policy without signs/symptoms or physical distress.   Resistance Training   Training Prescription (read-only) _2    Weight (read-only) _3 Reps (read-only) 10-12 10-12 10-12 10-12 10-12   Interval Training   Interval Training _4    Recumbant Bike   Level (read-only) _5 RPM (read-only) 65 65 65 65 65   Minutes (read-only) _6 Recumbant Elliptical   Level (read-only) _7 RPM (read-only) 40 40 45 45 50   Minutes (read-only) _8 T5 Nustep   Level (read-only) _9 Watts (read-only) _10 Minutes (read-only) _11 Biostep-RELP   Level (read-only)    6 6   Watts (read-only)    40 40   Minutes (read-only)    15 15     01/01/16 1100 01/03/16 1000 01/06/16 1400 01/20/16 1000 01/29/16 1400   Exercise Review   Progression _12    Response to Exercise   Blood Pressure (Admit)   130/68  mmHg --    Blood Pressure (Exercise)   152/70 mmHg --    Blood Pressure (Exit)   108/68 mmHg --    Heart Rate (Admit)   68 bpm --    Heart Rate (Exercise)   85 bpm --    Heart Rate (Exit)   69 bpm --    Oxygen Saturation (Admit)   94 % --    Oxygen Saturation (Exercise)   92 % --    Oxygen Saturation (Exit)   97 % --    Rating of Perceived Exertion (Exercise)   13 --    Perceived Dyspnea (Exercise)   4 --    Symptoms _0    Comments Reviewed individualized exercise prescription and made increases per departmental policy. Exercise increases were discussed with the patient and they were able to perform the new work loads without issue (no signs or symptoms).  Lyndol now does interval training as tolerated all equipment. Talor now does interval training as tolerated all equipment. Increases were made per department policy on Rhonda's exercise prescription. These increases were discussed with the patient and he tolerated them well with no signs or symptoms.  Increases were made per department policy on Zevin's exercise prescription. These increases were discussed with the patient and he tolerated them well with no signs or symptoms.    Duration Progress to 50 minutes of aerobic without signs/symptoms of physical distress Progress to 50 minutes of aerobic without signs/symptoms of physical distress Progress to 50 minutes of aerobic without  signs/symptoms of physical distress Progress to 50 minutes of aerobic without signs/symptoms of physical distress Progress to 50 minutes of aerobic without signs/symptoms of physical distress   Intensity Rest + 30 Rest + 30 Rest + 30 Rest + 30 Rest + 30   Progression   Progression Continue progressive overload as per policy without signs/symptoms or physical distress. Continue progressive overload as per policy without signs/symptoms or physical distress. Continue progressive overload as per policy without signs/symptoms or physical distress. Continue progressive overload as per policy without signs/symptoms or physical distress. Continue progressive overload as per policy without signs/symptoms or physical distress.   Resistance Training   Training Prescription (read-only) Yes Yes Yes Yes    Weight (read-only) _1 Reps (read-only) 10-12 10-12 10-12 10-12    Interval Training   Interval Training No Yes Yes Yes Yes   Equipment  Recumbant Bike;NuStep;Recumbant Elliptical  REL = BioStep Recumbant Bike;NuStep;Recumbant Elliptical  REL = BioStep Recumbant Bike;NuStep;Recumbant Elliptical  REL = BioStep Recumbant Bike;NuStep;Recumbant Elliptical  REL = BioStep   Recumbant Bike   Level (read-only) _2 RPM (read-only) 65 65 65 65    Minutes (read-only) _3 Recumbant Elliptical   Level (read-only) _4 RPM (read-only) 50 50 35 40    Minutes (read-only) _5 T5 Nustep   Level (read-only) 7  Alternate level 6 40 wawtts 10 min   Level 7 40 watts 5 min 7  Alternate level 6 40 wawtts 10 min   Level 7 40 watts 5 min 7  Alternate level 6 40 wawtts 10 min   Level 7 40 watts 5 min 7  Alternate level 6 40 wawtts 10 min   Level 7 40 watts 5 min    Watts (read-only) 40 40 40 40    Minutes (read-only) 5  _0 Biostep-RELP   Level (read-only) _1 Watts (read-only) 40 40 40 40    Minutes (read-only) _2 01/29/16 1500           Exercise  Review   Progression Yes       Response to Exercise   Blood Pressure (Admit) 124/60 mmHg       Blood Pressure (Exercise) 164/70 mmHg       Blood Pressure (Exit) 122/64 mmHg       Heart Rate (Admit) 65 bpm       Heart Rate (Exercise) 79 bpm       Heart Rate (Exit) 60 bpm       Oxygen Saturation (Admit) 97 %       Oxygen Saturation (Exercise) 93 %       Oxygen Saturation (Exit) 94 %       Rating of Perceived Exertion (Exercise) 14       Perceived Dyspnea (Exercise) 4       Symptoms None       Comments Increases were made per department policy on Mars's exercise prescription. These increases were discussed with the patient and he tolerated them well with no signs or symptoms.        Duration Progress to 50 minutes of aerobic without signs/symptoms of physical distress       Intensity Rest + 30       Progression   Progression Continue progressive overload as per policy without signs/symptoms or physical distress.       Interval Training   Interval Training Yes       Equipment Recumbant Bike;NuStep;Recumbant Elliptical  REL = BioStep       Recumbant Bike   Level 7       RPM 65       Minutes 15       Recumbant Elliptical   Level 7       RPM 40       Minutes 15       T5 Nustep   Level 7       Watts 45       Minutes 15       Home Exercise Plan   Plans to continue exercise at [employer] (comment)  [employer]          Discharge Exercise Prescription (Final Exercise Prescription Changes):     Exercise Prescription Changes - 01/29/16 1500    Exercise Review   Progression Yes   Response to Exercise   Blood Pressure (Admit) 124/60 mmHg   Blood Pressure (Exercise) 164/70 mmHg   Blood Pressure (Exit) 122/64 mmHg   Heart Rate (Admit) 65 bpm   Heart Rate (Exercise) 79 bpm   Heart Rate (Exit) 60 bpm   Oxygen Saturation (Admit) 97 %   Oxygen Saturation (Exercise) 93 %   Oxygen Saturation (Exit) 94 %   Rating of Perceived Exertion (Exercise) 14   Perceived Dyspnea  (Exercise) 4   Symptoms None   Comments Increases were made per department policy on Eashan's exercise prescription. These increases were discussed with the patient and he tolerated them well with no signs or symptoms.    Duration Progress to 50 minutes of aerobic without signs/symptoms of physical distress   Intensity Rest + 30   Progression   Progression Continue progressive overload as per policy without signs/symptoms or physical distress.  Interval Training   Interval Training Yes   Equipment Recumbant Bike;NuStep;Recumbant Elliptical  REL = BioStep   Recumbant Bike   Level 7   RPM 65   Minutes 15   Recumbant Elliptical   Level 7   RPM 40   Minutes 15   T5 Nustep   Level 7   Watts 45   Minutes 15   Home Exercise Plan   Plans to continue exercise at [employer] (comment)  [employer]       Nutrition:  Target Goals: Understanding of nutrition guidelines, daily intake of sodium <1523m, cholesterol <2051m calories 30% from fat and 7% or less from saturated fats, daily to have 5 or more servings of fruits and vegetables.  Biometrics:     Pre Biometrics - 10/22/15 1459    Pre Biometrics   Height 5' 9.5" (1.765 m)   Weight 228 lb 1.6 oz (103.465 kg)   Waist Circumference 45 inches   Hip Circumference 48 inches   Waist to Hip Ratio 0.94 %   BMI (Calculated) 33.3         Post Biometrics - 01/22/16 1140     Post  Biometrics   Height 5' 9.5" (1.765 m)   Weight 228 lb (103.42 kg)   Waist Circumference 34 inches   Hip Circumference 48 inches   Waist to Hip Ratio 0.71 %   BMI (Calculated) 33.3      Nutrition Therapy Plan and Nutrition Goals:     Nutrition Therapy & Goals - 12/06/15 1133    Nutrition Therapy   Diet Instructed on a meal plan incorporating dietary guidelines for lung health and diabetes based on 1700 calories.  Patient stated he decided he might benefit from meeting with dietitian   Drug/Food Interactions Statins/Certain Fruits   Protein  (oz) (read-only) 7 ounces/day   Fiber 30 grams   Whole Grain Foods 3 servings   Saturated Fats 11 max. grams   Fruits and Vegetables 5 servings/day   Personal Nutrition Goals   Personal Goal #1 To increase fruit to 1-2 servings per day.   Personal Goal #2 Balance meals with protein (refer to list), starch (2-3 servings). Add a fruit and non-starchy vegetable to as many meals as possible.   Personal Goal #3 Read labels for sodium, saturated fat and trans fat   Personal Goal #4 Try [employer] very low sodium tuna      Nutrition Discharge: Rate Your Plate Scores:     Nutrition Assessments - 12/06/15 1141    Rate Your Plate Scores   Pre Score 49   Pre Score % 54 %      Psychosocial: Target Goals: Acknowledge presence or absence of depression, maximize coping skills, provide positive support system. Participant is able to verbalize types and ability to use techniques and skills needed for reducing stress and depression.  Initial Review & Psychosocial Screening:     Initial Psych Review & Screening - 10/22/15 0900    Family Dynamics   Good Support System? Yes   Comments Mr KeWinklemanas good family support from his wife and children, [place] frustrations about his shortness of breath and not being able to do the activites as before his COPD   Screening Interventions   Interventions Encouraged to exercise;Program counselor consult      Quality of Life Scores:     Quality of Life - 01/24/16 1552    Quality of Life Scores   Health/Function  Pre 18.38 %   Health/Function Post 18.78 %   Health/Function % Change 2.21 %   Socioeconomic Post 25.79 %   Socioeconomic % Change -9.75 %   Psych/Spiritual Post 25.71 %   Psych/Spiritual % Change 3.45 %   Family Post 24 %   Family % Change -13.04 %   GLOBAL Post 22.31 %   GLOBAL % Change -3.1 %      PHQ-9:     Recent Review Flowsheet Data    Depression screen [facility] 2/9 01/24/2016 10/22/2015 04/18/2015  02/05/2014 02/02/2013   Decreased Interest 0 0 0 1 0   Down, Depressed, Hopeless 0 0 0 1 0   PHQ - 2 Score 0 0 0 2 0   Altered sleeping 0 - - - -   Tired, decreased energy 2 - - - -   Change in appetite 1 - - - -   Feeling bad or failure about yourself  1 - - - -   Trouble concentrating 1 - - - -   Moving slowly or fidgety/restless 0 - - - -   Suicidal thoughts 0 - - - -   PHQ-9 Score 5 - - - -   Difficult doing work/chores Somewhat difficult - - - -      Psychosocial Evaluation and Intervention:     Psychosocial Evaluation - 10/28/15 1128    Psychosocial Evaluation & Interventions   Interventions Stress management education;Relaxation education;Encouraged to exercise with the program and follow exercise prescription   Comments Counselor met with Mr. Sabic today for initial psychosocial evaluation.  He is an 82 year old who has COPD.  Mr. Mcdowell has a strong support sytem with a spouse of 28 years and is actively involved in his local faith community.  He states he has sleep apnea, but with the help of the CPAP he is sleeping well, - maybe "too much" with 9-10 hours typically per night.  He reports his appetite has decreased since the summer but he is not losing weight.  Mr. Tomei admits to having some depression or anxiety off and on in the past and is on medication that helps with this.  He states he is generally in a positive mood and that his primary stressor is his health at this time.  Mr. Hamrick reports that he drinks alcohol daily (mostly beer) and has for the past 20 plus years, but that it is not a problem and he has no desire or reason to change this.  Counselor reminded him that it is a depressant and also contains empty calories that could make weight loss more difficult.  He has goals for improving his breathing and his strength , especially in his legs in this program.  He is a member at a local gym and plans to continue attending there once he completes this program.  Counselor  will be following with Mr. Bathe while he is in this program.     Continued Psychosocial Services Needed Yes  He will benefit from the psychoeducational components of this program, especially on depression.        Psychosocial Re-Evaluation:     Psychosocial Re-Evaluation      11/13/15 1036 12/09/15 1106 01/06/16 1124       Psychosocial Re-Evaluation   Comments Counselor follow up with Mr. Schoolfield today reporting feeling "a little more stamina" since beginning this program.  He has holiday plans and has set some healthy limits with family meeting at an alternate location versus  his home due to his current health condition and that of his spouse.  Counselor commended Mr. Halley for his commitment to working out consistently - even over the holidays and for his progress.  Counselor will continue to follow throughout program.   Follow up with Mr. Walston today reporting noticing progress in this program as he is breathing easier, able to walk further, and has some increased strength currently.  His support system is stable currently.  Mr. Molner reports he sleeps well other than going to the restroom throughout the night.   He also has a good appetite and states that other than his health, his mood is typically positive.  He is on medications for depression and states they were increased 18 months ago with positive results and efficacy.  Counselor will continue to follow with Mr. Mcclafferty throughout the program.   Follow up with Mr. Dyal today reporting progress in this program with increased endurance as he is walking further and breathing better.  He has begun interval training recently and is doing well in this program.   Mr. Malek continues to sleep well and states his mood continues to be positive.         Education: Education Goals: Education classes will be provided on a weekly basis, covering required topics. Participant will state understanding/return demonstration of topics  presented.  Learning Barriers/Preferences:     Learning Barriers/Preferences - 10/22/15 0900    Learning Barriers/Preferences   Learning Barriers None   Learning Preferences Group Instruction;Individual Instruction;Pictoral;Skilled Demonstration;Verbal Instruction;Video;Written Material      Education Topics: Initial Evaluation Education: - Verbal, written and demonstration of respiratory meds, RPE/PD scales, oximetry and breathing techniques. Instruction on use of nebulizers and MDIs: cleaning and proper use, rinsing mouth with steroid doses and importance of monitoring MDI activations.          Pulmonary Rehab from 01/29/2016 in [facility] Cardiac and Pulmonary Rehab   Date  10/22/15   Educator  LB   Instruction Review Code  2- meets goals/outcomes      General Nutrition Guidelines/Fats and Fiber: -Group instruction provided by verbal, written material, models and posters to present the general guidelines for heart healthy nutrition. Gives an explanation and review of dietary fats and fiber.      Pulmonary Rehab from 01/29/2016 in [facility] Cardiac and Pulmonary Rehab   Date  12/30/15   Educator  CE   Instruction Review Code  2- meets goals/outcomes      Controlling Sodium/Reading Food Labels: -Group verbal and written material supporting the discussion of sodium use in heart healthy nutrition. Review and explanation with models, verbal and written materials for utilization of the food label.   Exercise Physiology & Risk Factors: - Group verbal and written instruction with models to review the exercise physiology of the cardiovascular system and associated critical values. Details cardiovascular disease risk factors and the goals associated with each risk factor.      Pulmonary Rehab from 01/29/2016 in [facility] Cardiac and Pulmonary Rehab   Date  10/30/15   Educator  SW   Instruction Review Code  2- meets goals/outcomes      Aerobic Exercise & Resistance Training: - Gives group verbal and  written discussion on the health impact of inactivity. On the components of aerobic and resistive training programs and the benefits of this training and how to safely progress through these programs.      Pulmonary Rehab from 01/29/2016 in [facility] Cardiac and Pulmonary Rehab   Date  12/18/15   Educator  SW   Instruction Review Code  2- meets goals/outcomes      Flexibility, Balance, General Exercise Guidelines: - Provides group verbal and written instruction on the benefits of flexibility and balance training programs. Provides general exercise guidelines with specific guidelines to those with heart or lung disease. Demonstration and skill practice provided.      Pulmonary Rehab from 01/29/2016 in [facility] Cardiac and Pulmonary Rehab   Date  01/08/16   Educator  RM   Instruction Review Code  2- meets goals/outcomes      Stress Management: - Provides group verbal and written instruction about the health risks of elevated stress, cause of high stress, and healthy ways to reduce stress.      Pulmonary Rehab from 01/29/2016 in [facility] Cardiac and Pulmonary Rehab   Date  01/15/16   Educator  [facility]   Instruction Review Code  2- meets goals/outcomes      Depression: - Provides group verbal and written instruction on the correlation between heart/lung disease and depressed mood, treatment options, and the stigmas associated with seeking treatment.      Pulmonary Rehab from 01/29/2016 in [facility] Cardiac and Pulmonary Rehab   Date  12/04/15   Educator  [facility]   Instruction Review Code  2- meets goals/outcomes      Exercise & Equipment Safety: - Individual verbal instruction and demonstration of equipment use and safety with use of the equipment.      Pulmonary Rehab from 01/29/2016 in [facility] Cardiac and Pulmonary Rehab   Date  10/25/15   Educator  [place]   Instruction Review Code  2- meets goals/outcomes      Infection Prevention: - Provides verbal and written material to individual with discussion of infection  control including proper hand washing and proper equipment cleaning during exercise session.      Pulmonary Rehab from 01/29/2016 in [facility] Cardiac and Pulmonary Rehab   Date  10/25/15   Educator  [place]   Instruction Review Code  2- meets goals/outcomes      Falls Prevention: - Provides verbal and written material to individual with discussion of falls prevention and safety.      Pulmonary Rehab from 01/29/2016 in [facility] Cardiac and Pulmonary Rehab   Date  10/22/15   Educator  LB      Diabetes: - Individual verbal and written instruction to review signs/symptoms of diabetes, desired ranges of glucose level fasting, after meals and with exercise. Advice that pre and post exercise glucose checks will be done for 3 sessions at entry of program.   Chronic Lung Diseases: - Group verbal and written instruction to review new updates, new respiratory medications, new advancements in procedures and treatments. Provide informative websites and "800" numbers of self-education.      Pulmonary Rehab from 01/29/2016 in [facility] Cardiac and Pulmonary Rehab   Date  01/06/16 [facility] Your Numbers]   Educator  LB   Instruction Review Code  2- meets goals/outcomes      Lung Procedures: - Group verbal and written instruction to describe testing methods done to diagnose lung disease. Review the outcome of test results. Describe the treatment choices: Pulmonary Function Tests, ABGs and oximetry.      Pulmonary Rehab from 01/29/2016 in [facility] Cardiac and Pulmonary Rehab   Date  01/03/16   Educator  [place]   Instruction Review Code  2- meets goals/outcomes      Energy Conservation: - Provide group verbal and written instruction for methods to  conserve energy, plan and organize activities. Instruct on pacing techniques, use of adaptive equipment and posture/positioning to relieve shortness of breath.   Triggers: - Group verbal and written instruction to review types of environmental controls: home humidity, furnaces, filters,  dust mite/pet prevention, HEPA vacuums. To discuss weather changes, air quality and the benefits of nasal washing.      Pulmonary Rehab from 01/29/2016 in [facility] Cardiac and Pulmonary Rehab   Date  12/09/15   Educator  LB   Instruction Review Code  2- meets goals/outcomes      Exacerbations: - Group verbal and written instruction to provide: warning signs, infection symptoms, calling MD promptly, preventive modes, and value of vaccinations. Review: effective airway clearance, coughing and/or vibration techniques. Create an Sports administrator.      Pulmonary Rehab from 01/29/2016 in [facility] Cardiac and Pulmonary Rehab   Date  11/06/15   Educator  LB   Instruction Review Code  2- meets goals/outcomes      Oxygen: - Individual and group verbal and written instruction on oxygen therapy. Includes supplement oxygen, available portable oxygen systems, continuous and intermittent flow rates, oxygen safety, concentrators, and Medicare reimbursement for oxygen.   Respiratory Medications: - Group verbal and written instruction to review medications for lung disease. Drug class, frequency, complications, importance of spacers, rinsing mouth after steroid MDI's, and proper cleaning methods for nebulizers.      Pulmonary Rehab from 01/29/2016 in [facility] Cardiac and Pulmonary Rehab   Date  10/22/15   Educator  LB      AED/CPR: - Group verbal and written instruction with the use of models to demonstrate the basic use of the AED with the basic ABC's of resuscitation.   Breathing Retraining: - Provides individuals verbal and written instruction on purpose, frequency, and proper technique of diaphragmatic breathing and pursed-lipped breathing. Applies individual practice skills.      Pulmonary Rehab from 01/29/2016 in [facility] Cardiac and Pulmonary Rehab   Date  10/25/15   Educator  [place]   Instruction Review Code  2- meets goals/outcomes      Anatomy and Physiology of the Lungs: - Group verbal and written instruction  with the use of models to provide basic lung anatomy and physiology related to function, structure and complications of lung disease.      Pulmonary Rehab from 01/29/2016 in [facility] Cardiac and Pulmonary Rehab   Date  11/22/15   Educator  [place]   Instruction Review Code  2- meets goals/outcomes      Heart Failure: - Group verbal and written instruction on the basics of heart failure: signs/symptoms, treatments, explanation of ejection fraction, enlarged heart and cardiomyopathy.      Pulmonary Rehab from 01/29/2016 in [facility] Cardiac and Pulmonary Rehab   Date  12/13/15   Educator  [place]   Instruction Review Code  2- meets goals/outcomes      Sleep Apnea: - Individual verbal and written instruction to review Obstructive Sleep Apnea. Review of risk factors, methods for diagnosing and types of masks and machines for OSA.   Anxiety: - Provides group, verbal and written instruction on the correlation between heart/lung disease and anxiety, treatment options, and management of anxiety.   Relaxation: - Provides group, verbal and written instruction about the benefits of relaxation for patients with heart/lung disease. Also provides patients with examples of relaxation techniques.      Pulmonary Rehab from 01/29/2016 in [facility] Cardiac and Pulmonary Rehab   Date  01/29/16   Educator  Lupita Leash  Instruction Review Code  2- Meets goals/outcomes      Knowledge Questionnaire Score:     Knowledge Questionnaire Score - 10/22/15 0900    Knowledge Questionnaire Score   Pre Score -4      Personal Goals and Risk Factors at Admission:     Personal Goals and Risk Factors at Admission - 10/22/15 0900    Core Components/Risk Factors/Patient Goals on Admission   Sedentary Yes   Intervention (read-only) While in program, learn and follow the exercise prescription taught. Start at a low level workload and increase workload after able to maintain previous level for 30 minutes. Increase time before increasing intensity.   Mr tsang wants to improve his stamina. He wants to return to the [employer]  for his workouts, and continue camping, church activites, and activities  with his wife.   Improve shortness of breath with ADL's Yes   Intervention (read-only) While in program, learn and follow the exercise prescription taught. Start at a low level workload and increase workload ad advised by the exercise physiologist. Increase time before increasing intensity.  Mr Nicodemus does want to improve his breathing and have more confidence in managing it with out fear.   Develop more efficient breathing techniques such as purse lipped breathing and diaphragmatic breathing; and practicing self-pacing with activity Yes   Intervention (read-only) --  Instructed Mr Stacks in PLB and DB - good technique.   Increase knowledge of respiratory medications and ability to use respiratory devices properly  Yes   Intervention (read-only) While in program, learn to administer MDI, nebulizer, and spacer properly.;Learn to take respiratory medicine as ordered.;While in program, learn to Clean MDI, nebulizers, and spacers properly.  Mr gladman was instructed on Spiriva and Ventolin MDIs. A spacer was given to him for use with the Ventolin with instruction.   Understand more about Heart/Pulmonary Disease. Yes   Intervention While in program utilize professionals for any questions, and attend the education sessions. Great websites to use are www.americanheart.org or www.lung.org for reliable information.  Mr Mankey has had COPD for many years, but he states he does not know much about it and is interested in learning more. Mr Longton has OSA and wears 10cmH2O pressure with nasal mask  through [place].      Personal Goals and Risk Factors Review:      Goals and Risk Factor Review      11/01/15 1501 11/04/15 1000 11/06/15 1042 11/11/15 1039 11/22/15 1247   Core Components/Risk Factors/Patient Goals Review   Personal Goals Review  Increase knowledge of respiratory medications and ability to use respiratory devices properly.;Improve shortness of breath with ADL's;Develop more efficient breathing techniques such as purse lipped breathing and diaphragmatic breathing and practicing self-pacing with activity. Understand more about Heart/Pulmonary Disease Weight Management Increase Aerobic Exercise and Physical Activity Weight Management   Weight Management (read-only)   Comments   --  Timur is aware that he is overweight.  HE wants to concentrate on his activity and improving his stamina while in the program. HE does not want to focus on weight loss at this time. HE is aware that the exercise may benefit towards weight loss.  Iaan has a BMI greater than 30. I reviewed this with him. We discussed opportunity for resouces to help him work on decreasing his BMI. Mainly today we focused on nutriton and exercise as his resources. I will contact the RD to set up a nutrition counseling appointment with [facility]. He will complete the  Rate Your PLate and Food Diary and bring it back next week.  After he mets with the RD we will look at the goals and focus on working on one or two goals will he remains in the program and for discahrge.    Increase Aerobic Exercise and Physical Activity (read-only)   Goals Progress/Improvement seen     Yes    Comments    Patient has increased his exericse intensities since starting the program (see exercise changes) and has tolerated the increases well. He stated that he feels stronger and he is now able to walk from the elevator to class, which he used to not be able to do. He stated that his SOB is him most limiting factor in what he is able to do, and that he has not seen much change in SOB yet.     Understand more about Heart/Pulmonary Disease (read-only)   Goals Progress/Improvement seen   Yes      Comments  Educated on COPD - reviewed Spirometry and results; recommended Mr Matton get a complete PFT for DLCO and  TLC; educated on importance of exercise and using his inhalers to slow COPD progression.      Improve shortness of breath with ADL's (read-only)   Goals Progress/Improvement seen  No       Comments Mr. Lowella Grip is on session 5 of 46.  He has been exercising before he started [facility] and cannot see any improvement, yet in his SOB.       Breathing Techniques (read-only)   Goals Progress/Improvement seen  Yes       Comments Mr. Soltis demonstrated proper technique of PLB while on the equipment in [facility].       Increase knowledge of respiratory medications (read-only)   Goals Progress/Improvement seen  Yes       Comments Mr. Dec has been given a spacer for his MDIs to use at home.  He has been started on Flonase for sinus drainage.           11/22/15 1411 12/04/15 1453 12/06/15 1543 12/25/15 1000 12/27/15 1023   Core Components/Risk Factors/Patient Goals Review   Personal Goals Review   Weight Management     Weight Management (read-only)   Comments   Eulas Post met witht Karolee Stamps RD to day.  He spent over an hour talking with her and setting personal nutrition goals. I will check with him in 2-3 weeks to see how the goals are progressing.     Increase Aerobic Exercise and Physical Activity (read-only)   Goals Progress/Improvement seen   Yes  Yes Yes   Comments  Mr Savant is progressing with his exercise goals. 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 With his improved increase in stamina in LW, We discussed possibly buying a tricycle, although a good one would be at less $2000.00   Mr Macera states that his stamina has improved with his participation in [place]. He can do some of his home activites for a longer time period. Jujhar is doing very well in the program and makes exercise progressions weekly, if not in every session. He has a passion for biking and has a trainer he can stationary ride on. His ultimate goal is to get strong  enough to bikes again. We disucssed that there may be other bikes that may be easier for him to ride than his moutain or road bikes and this could help with  the transition back to biking. Overall, his stamina is good and he can exercise continuously for the entire class. Moving forward, we will continue to progress Yehuda by increasing his exercise intensity.    Understand more about Heart/Pulmonary Disease (read-only)   Goals Progress/Improvement seen  Yes       Comments We talked about how pulmonary drugs effect the lung and how they work.  We also discussed cillia and their function in clearing mucus and the importance or taking maintenance medications.       Improve shortness of breath with ADL's (read-only)   Goals Progress/Improvement seen  Yes Yes  Yes    Comments Mr. Lowella Grip has been seeing a chiropractor for a pinched nerve that is causing hip pain.  He is feeling stronger but would like to talk with the exercise physiologist to find exercised to strengthen his hamstring. Mr Brosh states his breathing has improved. He wakes up in the morning and does not feel like he needs a puff on his rescue inhaler and also has more breath with his daily activites around the house.  Mr Brancato states that he can now walk to the mailbox and back to the house without stopping to get his breath. The mailbox is about 335f from the house.    Breathing Techniques (read-only)   Goals Progress/Improvement seen  Yes       Comments Mr. KCanningis performing proper technique with PLB while walking on the TM.       Increase knowledge of respiratory medications (read-only)   Goals Progress/Improvement seen  Yes       Comments We discused the differences between rescue and maintance medications.  He is using his spacer with his rescue inhaler in the mornings and evenings.         12/27/15 1426 12/27/15 1510 01/03/16 1255 01/20/16 1529 01/20/16 1533   Core Components/Risk Factors/Patient Goals Review   Personal Goals Review   Weight Management  Increase knowledge of respiratory medications and ability to use respiratory devices properly.    Review    Mr KElpersis interested in compairing prices of medications with different pharmacies. I directed him to [facility] web site. He states that Spiriva is very expencive , even with his copay. Mr KSokolovis interested in compairing prices of medications with different pharmacies. I directed him to [facility] web site. He states that Spiriva is very expensive , even with his copay.   Weight Management (read-only)   Comments  Glnn stated that he has been making changes as advised by the dietician. He feels that he has lost some weight. HIs chart shows fluctuation of 2-3 pounds since admission. GOsminwill continue to work on his nutrition goals. GReshardstated today he is not adding any salt to his meals.  Also had stated  he is eating oatmeal for breakfast. He had not been eating breakfast prior to starting to eat the oatmeal.     Understand more about Heart/Pulmonary Disease (read-only)   Comments We talked about cardiac medications today.  He understands the improtance of taking both heart and lung medications together because they work together.       Improve shortness of breath with ADL's (read-only)   Goals Progress/Improvement seen  Yes       Comments Mr. KMroczkowskisay that he can notice his strenght coming back and that he has more endurance since starting exercise.       Increase knowledge of  respiratory medications (read-only)   Comments Using PLB on the equipment in the gym when he is on Level 6 and 7.         01/28/16 0842           Core Components/Risk Factors/Patient Goals Review   Personal Goals Review Sedentary;Improve shortness of breath with ADL's;Increase knowledge of respiratory medications and ability to use respiratory devices properly.;Develop more efficient breathing techniques such as purse lipped breathing and  diaphragmatic breathing and practicing self-pacing with activity.       Review Mr Malecha has improved greatly with [facility] participation. His post 40md improved by 2284for 30% from his first 5m74m Minimal Importance difference for COPD is 98.4ft16fe will continue his exercise at [employer]least 3 days/wk. His shortness of breath is still the same, but he has more understanding of SOB and how to manage it and it's relationship to COPD. He has a good understnading of PLB and his inhalers.           Personal Goals Discharge (Final Personal Goals and Risk Factors Review):      Goals and Risk Factor Review - 01/28/16 0842    Core Components/Risk Factors/Patient Goals Review   Personal Goals Review Sedentary;Improve shortness of breath with ADL's;Increase knowledge of respiratory medications and ability to use respiratory devices properly.;Develop more efficient breathing techniques such as purse lipped breathing and diaphragmatic breathing and practicing self-pacing with activity.   Review Mr KelsWrinkle improved greatly with [facility] participation. His post 5mwd75mproved by 225ft 83f0% from his first 5mwd. 55mimal Importance difference for COPD is 98.4ft. He70fll continue his exercise at tjhe [employer] 3 days/wk. His shortness of breath is still the same, but he has more understanding of SOB and how to manage it and it's relationship to COPD. He has a good understnading of PLB and his inhalers.       ITP Comments:     ITP Comments      10/30/15 1000 11/04/15 1016 11/11/15 1035 11/13/15 1000 11/13/15 1011   ITP Comments Estimated timetable to achieve ITP goals is 32 sessions Estimated timetable to achieve ITP goals is 30 sessions.  Estimated timetable to achieve ITP goals is 27 sessions. Mr Barrick wEppingeren our Strength Booklet with exercises to do at home with his weights.  Personal and exercise goals expected to be met in 26 more sessions. Progress on specific individualized goals  will be charted in patient's ITP. Upon completion of the program the patient will be comfortable managing exercise goals and progression on their own.      11/15/15 1357 11/22/15 1220 11/27/15 1101 11/29/15 1046 12/04/15 1034   ITP Comments Goals are expected to be met in 25 more sessions.  Mr. Mancusi sMcdonnellat he is tired today and is unable to do as much as usual.  He does not feel sick, he seems to think that it is due to the cold weather and preparing for Christmas. Personal and exercise goals expected to be met in 24 more sessions. Progress on specific individualized goals will be charted in patient's ITP. Upon completion of the program the patient will be comfortable managing exercise goals and progression on their own.  Personal and exercise goals expected to be met in 23 more sessions. Progress on specific individualized goals will be charted in patient's ITP. Upon completion of the program the patient will be comfortable managing exercise goals and progression on their own.  Personal and exercise goals expected to be met in25 more sessions. Progress on specific individualized goals will be charted in patient's ITP. Upon completion of the program the patient will be comfortable managing exercise goals and progression on their own.  Personal and exercise goals expected to be met in 21 more sessions. Progress on specific individualized goals will be charted in patient's ITP. Upon completion of the program the patient will be comfortable managing exercise goals and progression on their own.      12/06/15 1105 12/09/15 1103 12/13/15 1035 12/18/15 1027 12/18/15 1047   ITP Comments Personal and exercise goals expected to be met in 16 more sessions. Progress on specific individualized goals will be charted in patient's ITP. Upon completion of the program the patient will be comfortable managing exercise goals and progression on their own. Personal and exercise goals anticipated to be met in 20 more sessions.   Personal and exercise goals expected to be met in 19 more sessions. Progress on specific individualized goals will be charted in patient's ITP. Upon completion of the program the patient will be comfortable managing exercise goals and progression on their own.  Kyon is making significant gains in class, feels good today with the warmer weather, breathing doing well. personal exercise goals to be accomplished in 23 more sessions. Pt. will be at a comfortable level to be albe to continue exercising on her own.     12/20/15 1047 12/23/15 1040 12/25/15 1107 12/27/15 1023 12/30/15 1027   ITP Comments Personal and exercise goals expected to be met in 16 more sessions. Progress on specific individualized goals will be charted in patient's ITP. Upon completion of the program the patient will be comfortable managing exercise goals and progression on their own.  Personal and exercise goals are expected to be met in 15 more seesions.  Personal and exercise goals expected to be met in  14 more sessions. Progress on specific individualized goals will be charted in patient's ITP. Upon completion of the program the patient will be comfortable managing exercise goals and progression on their own.  Personal and exercise goals expected to be met in 13 more sessions. Progress on specific individualized goals will be charted in patient's ITP. Upon completion of the program the patient will be comfortable managing exercise goals and progression on their own.  Personal and exercise goals expected to be met in 12 more sessions. Progress on specific individualized goals will be charted in patient's ITP. Upon completion of the program the patient will be comfortable managing exercise goals and progression on their own.      01/01/16 1109 01/01/16 1445 01/03/16 1051 01/06/16 1055 01/08/16 1057   ITP Comments Personal and exercise goals expected to be met in 11 more sessions. Progress on specific individualized goals will be charted in  patient's ITP. Upon completion of the program the patient will be comfortable managing exercise goals and progression on their own.  Mr Mccleod performed interval training on all 3 goals and did very well with acceptable RPE/PD's. Personal and exercise goals expected to be met in 10 more sessions. Progress on specific individualized goals will be charted in patient's ITP. Upon completion of the program the patient will be comfortable managing exercise goals and progression on their own.  Personal and exercise goals are expected to be met in 9 more sessions. See ITP for specific goal progress.  Personal and exercise goals are expected to be met in 8 more sessions. See ITP for specific goal progress.  01/10/16 1111 01/15/16 1122 01/17/16 1103 01/20/16 1059 01/29/16 1111   ITP Comments Personal and exercise goals expected to be met in 7 more sessions. Progress on specific individualized goals will be charted in patient's ITP. Upon completion of the program the patient will be comfortable managing exercise goals and progression on their own.  Personal and exercise goals expected to be met in 5 more sessions. Progress on specific individualized goals will be charted in patient's ITP. Upon completion of the program the patient will be comfortable managing exercise goals and progression on their own.  Personal and exercise goals expected to be met in 4 more sessions. Progress on specific individualized goals will be charted in patient's ITP. Upon completion of the program the patient will be comfortable managing exercise goals and progression on their own.  Personal and exercise goals expected to be met in 4 more sessiosns. See ITP for specific notes no goal progression.  Progress on specific individualized goals will be charted in patient's ITP. Upon completion of the program the patient will be comfortable managing exercise goals and progression on their own.       Comments: Mr Wrightsman graduated from [employer]  and will continue his exercise at the [employer].

## 2016-01-29 NOTE — Progress Notes (Signed)
Daily Session Note  Patient Details  Name: Christopher Santiago MRN: [id number] Date of Birth: 1931/05/27 Referring Provider:  Minna Merritts, MD  Encounter Date: 01/29/2016  Check In:     Session Check In - 01/29/16 1109    Check-In   Location [facility]   Staff Present Candiss Norse, MS, ACSM CEP, Exercise Physiologist;Steven Way, BS, ACSM EP-C, Exercise Physiologist;Laureen Owens Shark, BS, RRT, Respiratory Therapist   Supervising physician immediately available to respond to emergencies [facility] immediately available ER MD   Physician(s) Edd Fabian and Cinda Quest   Medication changes reported     No   Fall or balance concerns reported    No   Warm-up and Cool-down Performed on first and last piece of equipment   Resistance Training Performed Yes   VAD Patient? No   Pain Assessment   Currently in Pain? No/denies   Multiple Pain Sites No         Goals Met:  Proper associated with RPD/PD & O2 Sat [place] with exercise equipment Using PLB without cueing & demonstrates good technique Exercise tolerated well Strength training completed today  Goals Unmet:  Not Applicable  Comments: Patient completed exercise prescription and all exercise goals during rehab session. The exercise was tolerated well and the patient is progressing in the program.    Dr. Emily Filbert is Medical Director for [facility] and [facility].

## 2016-02-01 ENCOUNTER — Encounter: Payer: Self-pay | Admitting: Cardiovascular Disease

## 2016-02-03 ENCOUNTER — Encounter: Payer: Self-pay | Admitting: Family Medicine

## 2016-02-03 DIAGNOSIS — M9903 Segmental and somatic dysfunction of lumbar region: Secondary | ICD-10-CM | POA: Diagnosis not present

## 2016-02-03 DIAGNOSIS — M5136 Other intervertebral disc degeneration, lumbar region: Secondary | ICD-10-CM | POA: Diagnosis not present

## 2016-02-03 DIAGNOSIS — M62830 Muscle spasm of back: Secondary | ICD-10-CM | POA: Diagnosis not present

## 2016-02-03 DIAGNOSIS — M9901 Segmental and somatic dysfunction of cervical region: Secondary | ICD-10-CM | POA: Diagnosis not present

## 2016-02-04 ENCOUNTER — Other Ambulatory Visit: Payer: Self-pay | Admitting: *Deleted

## 2016-02-05 DIAGNOSIS — M9903 Segmental and somatic dysfunction of lumbar region: Secondary | ICD-10-CM | POA: Diagnosis not present

## 2016-02-05 DIAGNOSIS — M5136 Other intervertebral disc degeneration, lumbar region: Secondary | ICD-10-CM | POA: Diagnosis not present

## 2016-02-05 DIAGNOSIS — M62830 Muscle spasm of back: Secondary | ICD-10-CM | POA: Diagnosis not present

## 2016-02-05 DIAGNOSIS — M9901 Segmental and somatic dysfunction of cervical region: Secondary | ICD-10-CM | POA: Diagnosis not present

## 2016-02-06 DIAGNOSIS — M9903 Segmental and somatic dysfunction of lumbar region: Secondary | ICD-10-CM | POA: Diagnosis not present

## 2016-02-06 DIAGNOSIS — M9901 Segmental and somatic dysfunction of cervical region: Secondary | ICD-10-CM | POA: Diagnosis not present

## 2016-02-06 DIAGNOSIS — M62830 Muscle spasm of back: Secondary | ICD-10-CM | POA: Diagnosis not present

## 2016-02-06 DIAGNOSIS — M5136 Other intervertebral disc degeneration, lumbar region: Secondary | ICD-10-CM | POA: Diagnosis not present

## 2016-02-10 ENCOUNTER — Observation Stay (HOSPITAL_COMMUNITY)
Admission: EM | Admit: 2016-02-10 | Discharge: 2016-02-11 | Disposition: A | Discharge status: Home / Self Care | Payer: Medicare Other | Attending: Internal Medicine | Admitting: Internal Medicine

## 2016-02-10 ENCOUNTER — Observation Stay (HOSPITAL_COMMUNITY): Payer: Medicare Other

## 2016-02-10 ENCOUNTER — Emergency Department (HOSPITAL_COMMUNITY): Payer: Medicare Other

## 2016-02-10 ENCOUNTER — Encounter (HOSPITAL_COMMUNITY): Payer: Self-pay | Admitting: Emergency Medicine

## 2016-02-10 DIAGNOSIS — I2510 Atherosclerotic heart disease of native coronary artery without angina pectoris: Secondary | ICD-10-CM | POA: Diagnosis not present

## 2016-02-10 DIAGNOSIS — E785 Hyperlipidemia, unspecified: Secondary | ICD-10-CM | POA: Insufficient documentation

## 2016-02-10 DIAGNOSIS — F1010 Alcohol abuse, uncomplicated: Secondary | ICD-10-CM | POA: Insufficient documentation

## 2016-02-10 DIAGNOSIS — Z7982 Long term (current) use of aspirin: Secondary | ICD-10-CM | POA: Insufficient documentation

## 2016-02-10 DIAGNOSIS — Z79899 Other long term (current) drug therapy: Secondary | ICD-10-CM | POA: Diagnosis not present

## 2016-02-10 DIAGNOSIS — F32A Depression, unspecified: Secondary | ICD-10-CM | POA: Diagnosis present

## 2016-02-10 DIAGNOSIS — W010XXA Fall on same level from slipping, tripping and stumbling without subsequent striking against object, initial encounter: Secondary | ICD-10-CM | POA: Diagnosis not present

## 2016-02-10 DIAGNOSIS — G4733 Obstructive sleep apnea (adult) (pediatric): Secondary | ICD-10-CM | POA: Diagnosis not present

## 2016-02-10 DIAGNOSIS — F329 Major depressive disorder, single episode, unspecified: Secondary | ICD-10-CM | POA: Insufficient documentation

## 2016-02-10 DIAGNOSIS — Z87891 Personal history of nicotine dependence: Secondary | ICD-10-CM | POA: Insufficient documentation

## 2016-02-10 DIAGNOSIS — E291 Testicular hypofunction: Secondary | ICD-10-CM | POA: Insufficient documentation

## 2016-02-10 DIAGNOSIS — Z86718 Personal history of other venous thrombosis and embolism: Secondary | ICD-10-CM | POA: Diagnosis not present

## 2016-02-10 DIAGNOSIS — I452 Bifascicular block: Secondary | ICD-10-CM | POA: Diagnosis not present

## 2016-02-10 DIAGNOSIS — Z8521 Personal history of malignant neoplasm of larynx: Secondary | ICD-10-CM | POA: Diagnosis not present

## 2016-02-10 DIAGNOSIS — J441 Chronic obstructive pulmonary disease with (acute) exacerbation: Secondary | ICD-10-CM | POA: Diagnosis present

## 2016-02-10 DIAGNOSIS — Z86711 Personal history of pulmonary embolism: Secondary | ICD-10-CM | POA: Diagnosis not present

## 2016-02-10 DIAGNOSIS — R531 Weakness: Secondary | ICD-10-CM | POA: Diagnosis not present

## 2016-02-10 DIAGNOSIS — R410 Disorientation, unspecified: Secondary | ICD-10-CM | POA: Diagnosis not present

## 2016-02-10 DIAGNOSIS — N400 Enlarged prostate without lower urinary tract symptoms: Secondary | ICD-10-CM | POA: Insufficient documentation

## 2016-02-10 DIAGNOSIS — S0181XA Laceration without foreign body of other part of head, initial encounter: Secondary | ICD-10-CM | POA: Diagnosis not present

## 2016-02-10 DIAGNOSIS — M545 Low back pain: Secondary | ICD-10-CM | POA: Diagnosis not present

## 2016-02-10 DIAGNOSIS — E119 Type 2 diabetes mellitus without complications: Secondary | ICD-10-CM | POA: Insufficient documentation

## 2016-02-10 DIAGNOSIS — R52 Pain, unspecified: Secondary | ICD-10-CM

## 2016-02-10 DIAGNOSIS — S0990XA Unspecified injury of head, initial encounter: Secondary | ICD-10-CM | POA: Diagnosis not present

## 2016-02-10 DIAGNOSIS — S299XXA Unspecified injury of thorax, initial encounter: Secondary | ICD-10-CM | POA: Diagnosis not present

## 2016-02-10 DIAGNOSIS — S199XXA Unspecified injury of neck, initial encounter: Secondary | ICD-10-CM | POA: Diagnosis not present

## 2016-02-10 DIAGNOSIS — J449 Chronic obstructive pulmonary disease, unspecified: Secondary | ICD-10-CM | POA: Diagnosis present

## 2016-02-10 DIAGNOSIS — I10 Essential (primary) hypertension: Secondary | ICD-10-CM | POA: Diagnosis present

## 2016-02-10 DIAGNOSIS — E782 Mixed hyperlipidemia: Secondary | ICD-10-CM

## 2016-02-10 DIAGNOSIS — S0083XA Contusion of other part of head, initial encounter: Secondary | ICD-10-CM | POA: Diagnosis not present

## 2016-02-10 DIAGNOSIS — S3992XA Unspecified injury of lower back, initial encounter: Secondary | ICD-10-CM | POA: Diagnosis not present

## 2016-02-10 DIAGNOSIS — J42 Unspecified chronic bronchitis: Secondary | ICD-10-CM | POA: Diagnosis not present

## 2016-02-10 DIAGNOSIS — M542 Cervicalgia: Secondary | ICD-10-CM | POA: Diagnosis not present

## 2016-02-10 DIAGNOSIS — S3993XA Unspecified injury of pelvis, initial encounter: Secondary | ICD-10-CM | POA: Diagnosis not present

## 2016-02-10 LAB — COMPREHENSIVE METABOLIC PANEL
ALBUMIN: 2.9 g/dL — AB (ref 3.5–5.0)
ALK PHOS: 37 U/L — AB (ref 38–126)
ALT: 23 U/L (ref 17–63)
AST: 26 U/L (ref 15–41)
Anion gap: 11 (ref 5–15)
BUN: 15 mg/dL (ref 6–20)
CALCIUM: 9.3 mg/dL (ref 8.9–10.3)
CO2: 28 mmol/L (ref 22–32)
CREATININE: 1.02 mg/dL (ref 0.61–1.24)
Chloride: 97 mmol/L — ABNORMAL LOW (ref 101–111)
GFR calc Af Amer: >60 mL/min (ref >60)
GFR calc non Af Amer: >60 mL/min (ref >60)
GLUCOSE: 142 mg/dL — AB (ref 65–99)
Potassium: 3.7 mmol/L (ref 3.5–5.1)
SODIUM: 136 mmol/L (ref 135–145)
Total Bilirubin: 0.8 mg/dL (ref 0.3–1.2)
Total Protein: 6.6 g/dL (ref 6.5–8.1)

## 2016-02-10 LAB — I-STAT ARTERIAL BLOOD GAS, ED
ACID-BASE EXCESS: 3 mmol/L — AB (ref 0.0–2.0)
BICARBONATE: 27.1 meq/L — AB (ref 20.0–24.0)
O2 SAT: 94 %
PH ART: 7.452 — AB (ref 7.350–7.450)
PO2 ART: 66 mmHg — AB (ref 80.0–100.0)
TCO2: 28 mmol/L (ref 0–100)
pCO2 arterial: 38.7 mmHg (ref 35.0–45.0)

## 2016-02-10 LAB — CBC
HCT: 35.5 % — ABNORMAL LOW (ref 39.0–52.0)
Hemoglobin: 11.4 g/dL — ABNORMAL LOW (ref 13.0–17.0)
MCH: 30.7 pg (ref 26.0–34.0)
MCHC: 32.1 g/dL (ref 30.0–36.0)
MCV: 95.7 fL (ref 78.0–100.0)
Platelets: 159 10*3/uL (ref 150–400)
RBC: 3.71 MIL/uL — AB (ref 4.22–5.81)
RDW: 13 % (ref 11.5–15.5)
WBC: 8.9 10*3/uL (ref 4.0–10.5)

## 2016-02-10 LAB — DIFFERENTIAL
Basophils Absolute: 0 10*3/uL (ref 0.0–0.1)
Basophils Relative: 0 %
Eosinophils Absolute: 0 10*3/uL (ref 0.0–0.7)
Eosinophils Relative: 0 %
LYMPHS PCT: 15 %
Lymphs Abs: 1.3 10*3/uL (ref 0.7–4.0)
MONO ABS: 1.4 10*3/uL — AB (ref 0.1–1.0)
Monocytes Relative: 15 %
NEUTROS ABS: 6.1 10*3/uL (ref 1.7–7.7)
NEUTROS PCT: 70 %

## 2016-02-10 LAB — ETHANOL: Alcohol, Ethyl (B): <5 mg/dL (ref <5)

## 2016-02-10 LAB — MAGNESIUM: MAGNESIUM: 2 mg/dL (ref 1.7–2.4)

## 2016-02-10 LAB — TYPE AND SCREEN
ABO/RH(D): O POS
ANTIBODY SCREEN: NEGATIVE

## 2016-02-10 LAB — RAPID URINE DRUG SCREEN, HOSP PERFORMED
Amphetamines: NOT DETECTED
BARBITURATES: NOT DETECTED
Benzodiazepines: NOT DETECTED
Cocaine: NOT DETECTED
Opiates: POSITIVE — AB
Tetrahydrocannabinol: NOT DETECTED

## 2016-02-10 LAB — URINALYSIS, ROUTINE W REFLEX MICROSCOPIC
Bilirubin Urine: NEGATIVE
Glucose, UA: NEGATIVE mg/dL
HGB URINE DIPSTICK: NEGATIVE
Ketones, ur: NEGATIVE mg/dL
LEUKOCYTES UA: NEGATIVE
Nitrite: NEGATIVE
PROTEIN: NEGATIVE mg/dL
Specific Gravity, Urine: 1.017 (ref 1.005–1.030)
pH: 5.5 (ref 5.0–8.0)

## 2016-02-10 LAB — PHOSPHORUS: Phosphorus: 3.2 mg/dL (ref 2.5–4.6)

## 2016-02-10 LAB — CREATININE, SERUM: CREATININE: 1 mg/dL (ref 0.61–1.24)

## 2016-02-10 LAB — AMMONIA: AMMONIA: 15 umol/L (ref 9–35)

## 2016-02-10 LAB — RPR: RPR: NONREACTIVE

## 2016-02-10 LAB — PROTIME-INR
INR: 1.24 (ref 0.00–1.49)
PROTHROMBIN TIME: 15.7 s — AB (ref 11.6–15.2)

## 2016-02-10 LAB — APTT: aPTT: 28 seconds (ref 24–37)

## 2016-02-10 LAB — TSH: TSH: 1.323 u[IU]/mL (ref 0.350–4.500)

## 2016-02-10 LAB — FOLATE: Folate: 14.7 ng/mL (ref >5.9)

## 2016-02-10 LAB — TROPONIN I: TROPONIN I: 0.03 ng/mL (ref <0.031)

## 2016-02-10 LAB — VITAMIN B12: VITAMIN B 12: 363 pg/mL (ref 180–914)

## 2016-02-10 LAB — HIV ANTIBODY (ROUTINE TESTING W REFLEX): HIV Screen 4th Generation wRfx: NONREACTIVE

## 2016-02-10 MED ORDER — DEXTROSE-NACL 5-0.45 % IV SOLN
INTRAVENOUS | Status: DC
  Administered 2016-02-10 – 2016-02-11 (×2): via INTRAVENOUS

## 2016-02-10 MED ORDER — FOLIC ACID 1 MG PO TABS
1.0000 mg | ORAL_TABLET | Freq: Every day | ORAL | Status: DC
  Administered 2016-02-10 – 2016-02-11 (×2): 1 mg via ORAL
  Filled 2016-02-10 (×2): qty 1

## 2016-02-10 MED ORDER — ONDANSETRON HCL 4 MG PO TABS: 4.0000 mg | ORAL_TABLET | Freq: Four times a day (QID) | ORAL | Status: DC | PRN

## 2016-02-10 MED ORDER — LOSARTAN POTASSIUM 50 MG PO TABS
25.0000 mg | ORAL_TABLET | Freq: Every day | ORAL | Status: DC
  Administered 2016-02-10 – 2016-02-11 (×2): 25 mg via ORAL
  Filled 2016-02-10 (×2): qty 1

## 2016-02-10 MED ORDER — ACETAMINOPHEN 650 MG RE SUPP: 650.0000 mg | Freq: Four times a day (QID) | RECTAL | Status: DC | PRN

## 2016-02-10 MED ORDER — THIAMINE HCL 100 MG/ML IJ SOLN: 100.0000 mg | Freq: Every day | INTRAMUSCULAR | Status: DC

## 2016-02-10 MED ORDER — ALLOPURINOL 100 MG PO TABS
100.0000 mg | ORAL_TABLET | Freq: Every day | ORAL | Status: DC
  Administered 2016-02-10 – 2016-02-11 (×2): 100 mg via ORAL
  Filled 2016-02-10 (×2): qty 1

## 2016-02-10 MED ORDER — ACETAMINOPHEN 325 MG PO TABS
650.0000 mg | ORAL_TABLET | Freq: Four times a day (QID) | ORAL | Status: DC | PRN
  Administered 2016-02-10: 650 mg via ORAL
  Filled 2016-02-10: qty 2

## 2016-02-10 MED ORDER — ONDANSETRON HCL 4 MG/2ML IJ SOLN: 4.0000 mg | Freq: Four times a day (QID) | INTRAMUSCULAR | Status: DC | PRN

## 2016-02-10 MED ORDER — ENOXAPARIN SODIUM 40 MG/0.4ML ~~LOC~~ SOLN
40.0000 mg | SUBCUTANEOUS | Status: DC
  Administered 2016-02-10 – 2016-02-11 (×2): 40 mg via SUBCUTANEOUS
  Filled 2016-02-10 (×2): qty 0.4

## 2016-02-10 MED ORDER — VITAMIN B-1 100 MG PO TABS
100.0000 mg | ORAL_TABLET | Freq: Every day | ORAL | Status: DC
  Administered 2016-02-10 – 2016-02-11 (×2): 100 mg via ORAL
  Filled 2016-02-10 (×2): qty 1

## 2016-02-10 MED ORDER — ASPIRIN EC 81 MG PO TBEC
81.0000 mg | DELAYED_RELEASE_TABLET | Freq: Every day | ORAL | Status: DC
  Administered 2016-02-10 – 2016-02-11 (×2): 81 mg via ORAL
  Filled 2016-02-10 (×2): qty 1

## 2016-02-10 MED ORDER — SIMVASTATIN 20 MG PO TABS
20.0000 mg | ORAL_TABLET | Freq: Every day | ORAL | Status: DC
  Administered 2016-02-10: 20 mg via ORAL
  Filled 2016-02-10: qty 1

## 2016-02-10 MED ORDER — TIOTROPIUM BROMIDE MONOHYDRATE 18 MCG IN CAPS
18.0000 ug | ORAL_CAPSULE | Freq: Every day | RESPIRATORY_TRACT | Status: DC
  Administered 2016-02-11: 18 ug via RESPIRATORY_TRACT
  Filled 2016-02-10: qty 5

## 2016-02-10 MED ORDER — ADULT MULTIVITAMIN W/MINERALS CH
1.0000 | ORAL_TABLET | Freq: Every day | ORAL | Status: DC
  Administered 2016-02-10 – 2016-02-11 (×2): 1 via ORAL
  Filled 2016-02-10 (×2): qty 1

## 2016-02-10 MED ORDER — METOPROLOL TARTRATE 25 MG PO TABS
25.0000 mg | ORAL_TABLET | Freq: Two times a day (BID) | ORAL | Status: DC
  Administered 2016-02-10 – 2016-02-11 (×3): 25 mg via ORAL
  Filled 2016-02-10 (×3): qty 1

## 2016-02-10 MED ORDER — CITALOPRAM HYDROBROMIDE 40 MG PO TABS
60.0000 mg | ORAL_TABLET | Freq: Every day | ORAL | Status: DC
  Administered 2016-02-10: 60 mg via ORAL
  Filled 2016-02-10: qty 1

## 2016-02-10 MED ORDER — QUETIAPINE FUMARATE 25 MG PO TABS
50.0000 mg | ORAL_TABLET | Freq: Every day | ORAL | Status: DC
  Administered 2016-02-10: 50 mg via ORAL
  Filled 2016-02-10 (×2): qty 2

## 2016-02-10 MED ORDER — LORAZEPAM 1 MG PO TABS: 1.0000 mg | ORAL_TABLET | Freq: Four times a day (QID) | ORAL | Status: DC | PRN

## 2016-02-10 MED ORDER — LORAZEPAM 2 MG/ML IJ SOLN: 1.0000 mg | Freq: Four times a day (QID) | INTRAMUSCULAR | Status: DC | PRN

## 2016-02-10 NOTE — Progress Notes (Addendum)
Patient Sp02 95% on room air at rest. Sp02 92% on room air while ambulating. Patient had no complaints of shortness of breath and stated he gets short of breath while ambulating "long distances". Patient stated his legs "only get weak when he walks long distances" and "I fall when I trip over things. My legs don't lift up like the used too". Patient had steady gate and stated he did not use a walker or cane at home.
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
## 2016-02-10 NOTE — Progress Notes (Signed)
Christopher Santiago is a 82 y.o. male patient admitted from ED awake, alert - oriented  X 4 - no acute distress noted.  VSS - Blood pressure 137/70, pulse 87, temperature 99 F (37.2 C), temperature source Oral, resp. rate 18, height '5\' 9"'$  (1.753 m), weight 100.245 kg (221 lb), SpO2 96 %.    IV in place, occlusive dsg intact without redness.  Orientation to room, and floor completed with information packet given to patient/family.  Patient declined safety video at this time.  Admission INP armband ID verified with patient/family, and in place.   SR up x 2, fall assessment complete, with patient and family able to verbalize understanding of risk associated with falls, and verbalized understanding to call nsg before up out of bed.  Call light within reach, patient able to voice, and demonstrate understanding.   Abrasions noted to bilateral arms with scabs.      Will cont to eval and treat per MD orders.  L'ESPERANCE, Luberta Mutter, RN 02/10/2016 8:56 AM

## 2016-02-10 NOTE — ED Notes (Addendum)
Ambulated pt to & from bathroom with only standby assistance. Pt reported dyspnea with exertion, which has been an ongoing issue, but also reported seeing "spots & flashes" all around. Notified MD of added hallucinations.

## 2016-02-10 NOTE — ED Notes (Signed)
Per EMS, pt suffered mechanical fall at home, hitting head, approximately one week ago. No LOC reported. EMS reports pt's wife says that he began showing incr'd confusion and hallucinations beginning yesterday morning, 0700. Wife reported he woke up this evening, hallucinating & thinking she was at the foot of the bed. Says pt has never talked in his sleep or had lucid dreams before now.

## 2016-02-10 NOTE — Progress Notes (Signed)
Report received from Albert, RN for admission to [phone] who stated STAT ABG will be taken before transport

## 2016-02-10 NOTE — ED Notes (Signed)
Attempted to call report

## 2016-02-10 NOTE — ED Notes (Signed)
Dr. Marily Memos MD at bedside.

## 2016-02-10 NOTE — ED Notes (Signed)
Called into room by pt's wife. Pt reports seeing "cobwebs, blowing in the wind, in the corners of the room."

## 2016-02-10 NOTE — Care Management Obs Status (Signed)
[facility] NOTIFICATION   Patient Details  Name: Christopher Santiago MRN: [id number] Date of Birth: 11-Feb-1931   Medicare Observation Status Notification Given:  Yes    Carles Collet, RN 02/10/2016, 10:58 AM

## 2016-02-10 NOTE — Progress Notes (Addendum)
Pharmacy made RN aware about Citalopram dose and stated that normal dosing does not exceed '40mg'$  and otherwise can prolong QT interval. Dr. Marily Memos paged and aware. MD order to hold Citalopram while in hospital.

## 2016-02-10 NOTE — ED Notes (Signed)
Patient transported to CT 
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
## 2016-02-11 DIAGNOSIS — J42 Unspecified chronic bronchitis: Secondary | ICD-10-CM

## 2016-02-11 DIAGNOSIS — F329 Major depressive disorder, single episode, unspecified: Secondary | ICD-10-CM | POA: Diagnosis not present

## 2016-02-11 DIAGNOSIS — R410 Disorientation, unspecified: Secondary | ICD-10-CM | POA: Diagnosis not present

## 2016-02-11 LAB — CBC
HEMATOCRIT: 34.6 % — AB (ref 39.0–52.0)
HEMOGLOBIN: 11.2 g/dL — AB (ref 13.0–17.0)
MCH: 30.8 pg (ref 26.0–34.0)
MCHC: 32.4 g/dL (ref 30.0–36.0)
MCV: 95.1 fL (ref 78.0–100.0)
Platelets: 148 10*3/uL — ABNORMAL LOW (ref 150–400)
RBC: 3.64 MIL/uL — ABNORMAL LOW (ref 4.22–5.81)
RDW: 12.7 % (ref 11.5–15.5)
WBC: 7.6 10*3/uL (ref 4.0–10.5)

## 2016-02-11 LAB — BASIC METABOLIC PANEL
ANION GAP: 7 (ref 5–15)
BUN: 15 mg/dL (ref 6–20)
CALCIUM: 8.9 mg/dL (ref 8.9–10.3)
CO2: 25 mmol/L (ref 22–32)
CREATININE: 0.84 mg/dL (ref 0.61–1.24)
Chloride: 103 mmol/L (ref 101–111)
GFR calc non Af Amer: >60 mL/min (ref >60)
Glucose, Bld: 156 mg/dL — ABNORMAL HIGH (ref 65–99)
Potassium: 3.6 mmol/L (ref 3.5–5.1)
SODIUM: 135 mmol/L (ref 135–145)

## 2016-02-11 MED ORDER — QUETIAPINE FUMARATE 50 MG PO TABS
25.0000 mg | ORAL_TABLET | Freq: Every day | ORAL | Status: DC
Start: 2016-02-11 — End: 2016-02-18

## 2016-02-11 MED ORDER — CITALOPRAM HYDROBROMIDE 40 MG PO TABS: 40.0000 mg | ORAL_TABLET | Freq: Every day | ORAL | Status: DC

## 2016-02-11 MED ORDER — CHLORDIAZEPOXIDE HCL 25 MG PO CAPS: 25.0000 mg | ORAL_CAPSULE | Freq: Three times a day (TID) | ORAL | Status: DC | PRN

## 2016-02-11 MED ORDER — FOLIC ACID 1 MG PO TABS: 1.0000 mg | ORAL_TABLET | Freq: Every day | ORAL | Status: DC

## 2016-02-11 MED ORDER — THIAMINE HCL 100 MG PO TABS: 100.0000 mg | ORAL_TABLET | Freq: Every day | ORAL | Status: DC

## 2016-02-11 NOTE — Evaluation (Addendum)
Physical Therapy Evaluation and Discharge Patient Details Name: Christopher Santiago MRN: [id number] DOB: 11/17/1931 Today's Date: 02/11/2016   History of Present Illness  Adm with confusion (s/p fall 3 days PTA with headstrike per wife; ?LOC); MRI negative; ?Wernicke's encephalopathy (h/o ETOH abuse) PMHx- COPD, DVT with PE, DM, vocal cord CA  Clinical Impression  Patient evaluated by Physical Therapy with full details below. Patient reports recent fall due to "tripping" on the threshold/step to dayroom. Admits he has decreased balance, however he is not interested in pursuing OPPT. He has recently completed Pulmonary Rehab program at [facility] and plans to continue working out 3x/wk at the gym he belongs to. All education (incr fall risk, potential injuries he can sustain) has been completed and the patient has no further questions. PT is signing off. Thank you for this referral.     Follow Up Recommendations Outpatient PT (patient currently refusing OPPT)    Equipment Recommendations  None recommended by PT    Recommendations for Other Services       Precautions / Restrictions Precautions Precautions: Fall Precaution Comments: Pt reports recent fall due to trip on step into dayroom      Mobility  Bed Mobility Overal bed mobility: Modified Independent             General bed mobility comments: HOB flat, no rail  Transfers Overall transfer level: Modified independent Equipment used: None             General transfer comment: pt takes his time; no imbalance noted  Ambulation/Gait Ambulation/Gait assistance: Supervision Ambulation Distance (Feet): 150 Feet Assistive device: None Gait Pattern/deviations: Step-through pattern;Decreased stride length;Wide base of support Gait velocity: decr, appropriate for his lung function Gait velocity interpretation: at or above normal speed for age/gender General Gait Details: pt reports when he fatigues, he does not lift his feet enough  and causes tripping  Stairs            Wheelchair Mobility    Modified Rankin (Stroke Patients Only)       Balance Overall balance assessment: Needs assistance Sitting-balance support: No upper extremity supported;Feet supported Sitting balance-Leahy Scale: Good     Standing balance support: No upper extremity supported Standing balance-Leahy Scale: Good           Rhomberg - Eyes Opened: 30 Rhomberg - Eyes Closed: 10 High level balance activites: Side stepping;Backward walking;Direction changes High Level Balance Comments: pt cautious with changes in direction or other challenges (slows down; wider BOS) Standardized Balance Assessment Standardized Balance Assessment : Berg Balance Test Berg Balance Test Sit to Stand: Able to stand  independently using hands Standing Unsupported: Able to stand safely 2 minutes Sitting with Back Unsupported but Feet Supported on Floor or Stool: Able to sit safely and securely 2 minutes Stand to Sit: Controls descent by using hands Transfers: Able to transfer safely, definite need of hands Standing Unsupported with Eyes Closed: Able to stand 10 seconds safely Standing Ubsupported with Feet Together: Able to place feet together independently but unable to hold for 30 seconds         Pertinent Vitals/Pain See separate progress note re: SaO2/HR   Pain Assessment: No/denies pain    Home Living Family/patient expects to be discharged to:: Private residence Living Arrangements: Spouse/significant other   Type of Home: House       Home Layout: Two level Home Equipment: None      Prior Function Level of Independence: Independent  Comments: reports some imbalance PTA, but denies other falls     Hand Dominance        Extremity/Trunk Assessment   Upper Extremity Assessment: Overall WFL for tasks assessed           Lower Extremity Assessment: Generalized weakness      Cervical / Trunk Assessment:  Normal  Communication   Communication: No difficulties  Cognition Arousal/Alertness: Lethargic (sleeping on arrival; improved throughout session) Behavior During Therapy: WFL for tasks assessed/performed Overall Cognitive Status: Impaired/Different from baseline Area of Impairment: Orientation;Safety/judgement Orientation Level: Time (even after corrected remained confused)   Memory: Decreased short-term memory   Safety/Judgement: Decreased awareness of safety     General Comments: Patient is aware of decreased balance, but refusing f/u PT at this time    General Comments      Exercises        Assessment/Plan    PT Assessment All further PT needs can be met in the next venue of care  PT Diagnosis Generalized weakness;Difficulty walking   PT Problem List Decreased strength;Decreased balance;Decreased mobility;Decreased safety awareness  PT Treatment Interventions     PT Goals (Current goals can be found in the Care Plan section) Acute Rehab PT Goals Patient Stated Goal: return to working out at the gym 3 days/week PT Goal Formulation: All assessment and education complete, DC therapy    Frequency     Barriers to discharge        Co-evaluation               End of Session Equipment Utilized During Treatment: Gait belt Activity Tolerance: Patient tolerated treatment well Patient left: in chair;with call bell/phone within reach;with chair alarm set Nurse Communication: Mobility status;Other (comment) (SaO2 measured with walking)    Functional Assessment Tool Used: clinical judgement; partial Berg Functional Limitation: Mobility: Walking and moving around Mobility: Walking and Moving Around Current Status [phone]): At least 1 percent but less than 20 percent impaired, limited or restricted Mobility: Walking and Moving Around Goal Status [phone]): At least 1 percent but less than 20 percent impaired, limited or restricted Mobility: Walking and Moving Around  Discharge Status [phone]): At least 1 percent but less than 20 percent impaired, limited or restricted    Time: 0903-0932 PT Time Calculation (min) (ACUTE ONLY): 29 min   Charges:   PT Evaluation $PT Eval Moderate Complexity: 1 Procedure PT Treatments $Gait Training: 8-22 mins   PT G Codes:   PT G-Codes **NOT FOR INPATIENT CLASS** Functional Assessment Tool Used: clinical judgement; partial Berg Functional Limitation: Mobility: Walking and moving around Mobility: Walking and Moving Around Current Status [phone]): At least 1 percent but less than 20 percent impaired, limited or restricted Mobility: Walking and Moving Around Goal Status [phone]): At least 1 percent but less than 20 percent impaired, limited or restricted Mobility: Walking and Moving Around Discharge Status [phone]): At least 1 percent but less than 20 percent impaired, limited or restricted    Aleesia Henney 02/11/2016, 9:47 AM Pager [phone]

## 2016-02-11 NOTE — Progress Notes (Signed)
Pharmacist Provided - Patient Medication Education Prior to Discharge   Christopher Santiago is an 82 y.o. male who presented to [facility] on 02/10/2016 with a chief complaint of  Chief Complaint  Patient presents with  . Fall  . Altered Mental Status     '[x]'$  Patient will be discharged with 3 new medications '[]'$  Patient being discharged without any new medications  The following medications were discussed with the patient: Seroquel, citalopram, folic acid, thiamine  Pain Control medications: '[]'$  Yes    '[x]'$  No  Diabetes Medications: '[]'$  Yes    '[x]'$  No  Heart Failure Medications: '[]'$  Yes    '[x]'$  No  Anticoagulation Medications:  '[]'$  Yes    '[x]'$  No  Antibiotics at discharge: '[]'$  Yes    '[x]'$  No  Allergy Assessment Completed and Updated: '[x]'$  Yes    '[]'$  No Identified Patient Allergies:  Allergies  Allergen Reactions  . Tessalon [Benzonatate]     Ineffective, nasal congestion     Medication Adherence Assessment: '[x]'$  Excellent (no doses missed/week)      '[]'$  Good (1 dose missed/week)      '[]'$  Partial (2-3 doses missed/week)      '[]'$  Poor (>3 doses missed/week)  Barriers to Obtaining Medications: '[]'$  Yes '[x]'$  No  Assessment: I had an extensive conversation with Christopher Santiago and Christopher Santiago. We discussed Christopher new medications (thiamine, folic acid, Seroquel), and the reason that they were being started. I educated him on the importance of decreasing Christopher citalopram dose when he begins the Seroquel due to the potential for QT prolongation and arrhythmia. We also discussed the importance of following up with Christopher PCP when he is discharged so that he can be adequately monitored and make sure Christopher medications are working appropriately. He is also followed by a cardiologist who regularly performs EKGs, and I explained how it is important that he continues to have this done while he is on a higher dose of citalopram + Seroquel. He understands Christopher plan at discharge. Both him and Christopher Santiago were very appreciative of the time I spent  with them and of my explanation of Christopher medications, why they were being started, and how to appropriately monitor them.  Time spent preparing for discharge counseling: 10 min Time spent counseling patient: 1 hr 15 min  Governor Specking, PharmD Clinical Pharmacy Resident Pager: [phone] 02/11/2016, 6:49 PM

## 2016-02-11 NOTE — Care Management Note (Signed)
Case Management Note  Patient Details  Name: Christopher Santiago MRN: [id number] Date of Birth: 1930-12-10  Subjective/Objective:                 Spoke with patient and wife at the bedside. Patient from home with wife.  Patient in obs for delirium. Will DC to home today. Patient declined OP PT.   Action/Plan:  DC to home with wife today.  Expected Discharge Date:                  Expected Discharge Plan:  Home/Self Care  In-House Referral:     Discharge planning Services  CM Consult  Post Acute Care Choice:  NA Choice offered to:     DME Arranged:    DME Agency:     HH Arranged:    [place] Agency:     Status of Service:  Completed, signed off  Medicare Important Message Given:    Date Medicare IM Given:    Medicare IM give by:    Date Additional Medicare IM Given:    Additional Medicare Important Message give by:     If discussed at [place] of Stay Meetings, dates discussed:    Additional Comments:  Carles Collet, RN 02/11/2016, 12:35 PM

## 2016-02-11 NOTE — Discharge Summary (Signed)
Physician Discharge Summary  Christopher Santiago MRN: [id number] DOB/AGE: 80/15/1932 80 y.o.  PCP: Elsie Stain, MD   Admit date: 02/10/2016 Discharge date: 02/11/2016  Discharge Diagnoses:     Active Problems:   Depression   Essential hypertension   COPD (chronic obstructive pulmonary disease) (HCC)   Delirium   ETOH abuse   HLD (hyperlipidemia)    Follow-up recommendations Follow-up with PCP in 3-5 days , including all  additional recommended appointments as below Follow-up CBC, CMP in 3-5 days Alcohol cessation counseling done Discussed with the patient's wife prior to DC      Discharge Condition: Stable    Discharge Instructions Get Medicines reviewed and adjusted: Please take all your medications with you for your next visit with your Primary MD  Please request your Primary MD to go over all hospital tests and procedure/radiological results at the follow up, please ask your Primary MD to get all Hospital records sent to his/her office.  If you experience worsening of your admission symptoms, develop shortness of breath, life threatening emergency, suicidal or homicidal thoughts you must seek medical attention immediately by calling 911 or calling your MD immediately if symptoms less severe.  You must read complete instructions/literature along with all the possible adverse reactions/side effects for all the Medicines you take and that have been prescribed to you. Take any new Medicines after you have completely understood and accpet all the possible adverse reactions/side effects.   Do not drive when taking Pain medications.   Do not take more than prescribed Pain, Sleep and Anxiety Medications  Special Instructions: If you have smoked or chewed Tobacco in the last 2 yrs please stop smoking, stop any regular Alcohol and or any Recreational drug use.  Wear Seat belts while driving.  Please note  You were cared for by a hospitalist during your hospital stay.  Once you are discharged, your primary care physician will handle any further medical issues. Please note that NO REFILLS for any discharge medications will be authorized once you are discharged, as it is imperative that you return to your primary care physician (or establish a relationship with a primary care physician if you do not have one) for your aftercare needs so that they can reassess your need for medications and monitor your lab values.      Discharge Instructions    Diet - low sodium heart healthy    Complete by:  As directed      Increase activity slowly    Complete by:  As directed           Current Discharge Medication List    START taking these medications   Details  folic acid (FOLVITE) 1 MG tablet Take 1 tablet (1 mg total) by mouth daily. Qty: 30 tablet, Refills: 0    QUEtiapine (SEROQUEL) 50 MG tablet Take 0.5 tablets (25 mg total) by mouth at bedtime. Qty: 30 tablet, Refills: 0    thiamine 100 MG tablet Take 1 tablet (100 mg total) by mouth daily. Qty: 30 tablet, Refills: 0      CONTINUE these medications which have CHANGED   Details  citalopram (CELEXA) 40 MG tablet Take 1 tablet (40 mg total) by mouth daily. Qty: 180 tablet, Refills: 2      CONTINUE these medications which have NOT CHANGED   Details  albuterol (PROVENTIL HFA;VENTOLIN HFA) 108 (90 BASE) MCG/ACT inhaler Inhale 2 puffs into the lungs every 6 (six) hours as needed for wheezing or shortness of breath.  Qty: 18 g, Refills: 12    allopurinol (ZYLOPRIM) 100 MG tablet Take 1 tablet (100 mg total) by mouth daily. Qty: 120 tablet, Refills: 2    aspirin 81 MG EC tablet Take 81 mg by mouth daily.      Cholecalciferol (VITAMIN D3) 1000 UNITS tablet Take 2,000 Units by mouth daily.     ipratropium (ATROVENT) 0.03 % nasal spray Place 2 sprays into both nostrils every 12 (twelve) hours. Qty: 30 mL, Refills: 12    losartan (COZAAR) 25 MG tablet TAKE ONE TABLET BY MOUTH EVERY DAY Qty: 30 tablet,  Refills: 3    metoprolol tartrate (LOPRESSOR) 25 MG tablet Take 1 tablet (25 mg total) by mouth 2 (two) times daily. Qty: 240 tablet, Refills: 1    simvastatin (ZOCOR) 20 MG tablet Take 1 tablet (20 mg total) by mouth at bedtime. Qty: 120 tablet, Refills: 3    tiotropium (SPIRIVA HANDIHALER) 18 MCG inhalation capsule PLACE 1 CAPSULE INTO INHALER AND INHALE DAILY Qty: 120 capsule, Refills: 1    triamcinolone (NASACORT ALLERGY 24HR) 55 MCG/ACT AERO nasal inhaler Place 2 sprays into the nose at bedtime as needed. Qty: 1 Inhaler, Refills: 5       Allergies  Allergen Reactions  . Tessalon [Benzonatate]     Ineffective, nasal congestion      Disposition:    Consults:  Psychiatry to determine capacity, rule out dementia     Significant Diagnostic Studies:  Dg Chest 1 View  02/10/2016  CLINICAL DATA:  [place] about 1 week ago. Patient's wife reports increasing confusion yesterday. EXAM: CHEST 1 VIEW COMPARISON:  01/09/2015 FINDINGS: There is linear left base opacity due to scarring or atelectasis. The right lung is clear except for stable upper lobe nodule. Hilar and mediastinal contours are unremarkable and unchanged. Pulmonary vasculature is normal. IMPRESSION: Linear scarring or atelectasis in the left base. Electronically Signed   By: Andreas Newport M.D.   On: 02/10/2016 04:27   Dg Thoracic Spine 4v  02/10/2016  CLINICAL DATA:  [place] at home last week EXAM: THORACIC SPINE - 4+ VIEW COMPARISON:  None. FINDINGS: There is no evidence of thoracic spine fracture. Alignment is normal. There is slight anterior compression of T4 which is unchanged from 11/07/2015 CT. No acute fracture is evident. No bone lesion or bony destruction. IMPRESSION: Negative for acute thoracic spine fracture Electronically Signed   By: Andreas Newport M.D.   On: 02/10/2016 04:30   Dg Lumbar Spine Complete  02/10/2016  CLINICAL DATA:  [place] at home last week. EXAM: LUMBAR SPINE - COMPLETE 4+ VIEW COMPARISON:   None. FINDINGS: The lumbar vertebrae are normal in height. No evidence of an acute lumbar spine fracture. There is grade 1 spondylolisthesis at L4, likely degenerative. There is no bone lesion or bony destruction. IVC filter incidentally noted. Infrarenal abdominal aortic aneurysm incidentally noted. IMPRESSION: Negative for acute lumbar spine fracture. Infrarenal abdominal aortic aneurysm, probably less than 4 cm based on the calcifications. Electronically Signed   By: Andreas Newport M.D.   On: 02/10/2016 04:33   Dg Pelvis 1-2 Views  02/10/2016  CLINICAL DATA:  [place] at home last week EXAM: PELVIS - 1-2 VIEW COMPARISON:  None. FINDINGS: There is no evidence of pelvic fracture or diastasis. No pelvic bone lesions are seen. IMPRESSION: Negative. Electronically Signed   By: Andreas Newport M.D.   On: 02/10/2016 04:31   Ct Head Wo Contrast  02/10/2016  CLINICAL DATA:  [place] and hit head at home 1  week ago. Yesterday began showing increasing confusion. EXAM: CT HEAD WITHOUT CONTRAST CT CERVICAL SPINE WITHOUT CONTRAST TECHNIQUE: Multidetector CT imaging of the head and cervical spine was performed following the standard protocol without intravenous contrast. Multiplanar CT image reconstructions of the cervical spine were also generated. COMPARISON:  08/27/2009 FINDINGS: CT HEAD FINDINGS There is no intracranial hemorrhage or extra-axial fluid collection. There is moderately severe generalized atrophy which has worsened from 08/27/2009. There is white matter hypodensity which appears chronic, likely due to small vessel disease. This is also worsened. There is no extra-axial fluid collection. The calvarium and skullbase are intact. There is mild membrane thickening of the left frontal sinus and several of the ethmoid air cells. CT CERVICAL SPINE FINDINGS The vertebral column, pedicles and facet articulations are intact. There is no evidence of acute fracture. No acute soft tissue abnormalities are evident.  Moderate degenerative cervical disc disease is present from C5 through C7. IMPRESSION: 1. Negative for acute intracranial traumatic injury. There is moderately severe generalized atrophy and chronic white matter hypodensity, likely due to small vessel disease. 2. Negative for acute cervical spine fracture. Electronically Signed   By: Andreas Newport M.D.   On: 02/10/2016 04:24   Ct Cervical Spine Wo Contrast  02/10/2016  CLINICAL DATA:  [place] and hit head at home 1 week ago. Yesterday began showing increasing confusion. EXAM: CT HEAD WITHOUT CONTRAST CT CERVICAL SPINE WITHOUT CONTRAST TECHNIQUE: Multidetector CT imaging of the head and cervical spine was performed following the standard protocol without intravenous contrast. Multiplanar CT image reconstructions of the cervical spine were also generated. COMPARISON:  08/27/2009 FINDINGS: CT HEAD FINDINGS There is no intracranial hemorrhage or extra-axial fluid collection. There is moderately severe generalized atrophy which has worsened from 08/27/2009. There is white matter hypodensity which appears chronic, likely due to small vessel disease. This is also worsened. There is no extra-axial fluid collection. The calvarium and skullbase are intact. There is mild membrane thickening of the left frontal sinus and several of the ethmoid air cells. CT CERVICAL SPINE FINDINGS The vertebral column, pedicles and facet articulations are intact. There is no evidence of acute fracture. No acute soft tissue abnormalities are evident. Moderate degenerative cervical disc disease is present from C5 through C7. IMPRESSION: 1. Negative for acute intracranial traumatic injury. There is moderately severe generalized atrophy and chronic white matter hypodensity, likely due to small vessel disease. 2. Negative for acute cervical spine fracture. Electronically Signed   By: Andreas Newport M.D.   On: 02/10/2016 04:24   Mr Brain Wo Contrast  02/10/2016  CLINICAL DATA:  Confusion  with auditory and visual hallucinations. Recent fall with trauma to the head. EXAM: MRI HEAD WITHOUT CONTRAST TECHNIQUE: Multiplanar, multiecho pulse sequences of the brain and surrounding structures were obtained without intravenous contrast. COMPARISON:  Head CT same day FINDINGS: Diffusion imaging does not show any acute or subacute infarction. There chronic small-vessel ischemic changes of the pons. There are a few old small vessel cerebellar infarctions. There are a few old small vessel infarctions affecting the thalami in there are mild chronic small-vessel ischemic changes of the cerebral hemispheric deep white matter. No cortical or large vessel territory infarction. No mass lesion, hemorrhage, hydrocephalus or extra-axial collection. There is generalized brain atrophy. CP angle regions are normal. No pituitary mass. No inflammatory sinus disease. Major vessels at the base of the brain show flow. IMPRESSION: No acute, focal or reversible finding. Atrophy and chronic small vessel ischemic changes as outlined above. Electronically Signed  By: Nelson Chimes M.D.   On: 02/10/2016 10:43        Filed Weights   02/10/16 0250  Weight: 100.245 kg (221 lb)     Microbiology: No results found for this or any previous visit (from the past 240 hour(s)).     Blood Culture No results found for: SDES, [place], CULT, REPTSTATUS    Labs: Results for orders placed or performed during the hospital encounter of 02/10/16 (from the past 48 hour(s))  Urine rapid drug screen (hosp performed)not at [facility]     Status: Abnormal   Collection Time: 02/10/16  4:35 AM  Result Value Ref Range   Opiates POSITIVE (A) NONE DETECTED   Cocaine NONE DETECTED NONE DETECTED   Benzodiazepines NONE DETECTED NONE DETECTED   Amphetamines NONE DETECTED NONE DETECTED   Tetrahydrocannabinol NONE DETECTED NONE DETECTED   Barbiturates NONE DETECTED NONE DETECTED    Comment:        DRUG SCREEN FOR MEDICAL PURPOSES ONLY.   IF CONFIRMATION IS NEEDED FOR ANY PURPOSE, NOTIFY LAB WITHIN 5 DAYS.        LOWEST DETECTABLE LIMITS FOR URINE DRUG SCREEN Drug Class       Cutoff (ng/mL) Amphetamine      1000 Barbiturate      200 Benzodiazepine   381 Tricyclics       017 Opiates          300 Cocaine          300 THC              50   Urinalysis, Routine w reflex microscopic (not at [facility])     Status: None   Collection Time: 02/10/16  4:35 AM  Result Value Ref Range   Color, Urine YELLOW YELLOW   APPearance CLEAR CLEAR   Specific Gravity, Urine 1.017 1.005 - 1.030   pH 5.5 5.0 - 8.0   Glucose, UA NEGATIVE NEGATIVE mg/dL   Hgb urine dipstick NEGATIVE NEGATIVE   Bilirubin Urine NEGATIVE NEGATIVE   Ketones, ur NEGATIVE NEGATIVE mg/dL   Protein, ur NEGATIVE NEGATIVE mg/dL   Nitrite NEGATIVE NEGATIVE   Leukocytes, UA NEGATIVE NEGATIVE    Comment: MICROSCOPIC NOT DONE ON URINES WITH NEGATIVE PROTEIN, BLOOD, LEUKOCYTES, NITRITE, OR GLUCOSE <1000 mg/dL.  Type and screen [place]     Status: None   Collection Time: 02/10/16  4:35 AM  Result Value Ref Range   ABO/RH(D) O POS    Antibody Screen NEG    Sample Expiration 02/13/2016   Ethanol     Status: None   Collection Time: 02/10/16  4:50 AM  Result Value Ref Range   Alcohol, Ethyl (B) <5 <5 mg/dL    Comment:        LOWEST DETECTABLE LIMIT FOR SERUM ALCOHOL IS 5 mg/dL FOR MEDICAL PURPOSES ONLY   Protime-INR     Status: Abnormal   Collection Time: 02/10/16  4:51 AM  Result Value Ref Range   Prothrombin Time 15.7 (H) 11.6 - 15.2 seconds   INR 1.24 0.00 - 1.49  APTT     Status: None   Collection Time: 02/10/16  4:51 AM  Result Value Ref Range   aPTT 28 24 - 37 seconds  CBC     Status: Abnormal   Collection Time: 02/10/16  4:51 AM  Result Value Ref Range   WBC 8.9 4.0 - 10.5 K/uL   RBC 3.71 (L) 4.22 - 5.81 MIL/uL   Hemoglobin 11.4 (L)  13.0 - 17.0 g/dL   HCT 35.5 (L) 39.0 - 52.0 %   MCV 95.7 78.0 - 100.0 fL   MCH 30.7 26.0 - 34.0 pg    MCHC 32.1 30.0 - 36.0 g/dL   RDW 13.0 11.5 - 15.5 %   Platelets 159 150 - 400 K/uL  Differential     Status: Abnormal   Collection Time: 02/10/16  4:51 AM  Result Value Ref Range   Neutrophils Relative % 70 %   Neutro Abs 6.1 1.7 - 7.7 K/uL   Lymphocytes Relative 15 %   Lymphs Abs 1.3 0.7 - 4.0 K/uL   Monocytes Relative 15 %   Monocytes Absolute 1.4 (H) 0.1 - 1.0 K/uL   Eosinophils Relative 0 %   Eosinophils Absolute 0.0 0.0 - 0.7 K/uL   Basophils Relative 0 %   Basophils Absolute 0.0 0.0 - 0.1 K/uL  Comprehensive metabolic panel     Status: Abnormal   Collection Time: 02/10/16  4:51 AM  Result Value Ref Range   Sodium 136 135 - 145 mmol/L   Potassium 3.7 3.5 - 5.1 mmol/L   Chloride 97 (L) 101 - 111 mmol/L   CO2 28 22 - 32 mmol/L   Glucose, Bld 142 (H) 65 - 99 mg/dL   BUN 15 6 - 20 mg/dL   Creatinine, Ser 1.02 0.61 - 1.24 mg/dL   Calcium 9.3 8.9 - 10.3 mg/dL   Total Protein 6.6 6.5 - 8.1 g/dL   Albumin 2.9 (L) 3.5 - 5.0 g/dL   AST 26 15 - 41 U/L   ALT 23 17 - 63 U/L   Alkaline Phosphatase 37 (L) 38 - 126 U/L   Total Bilirubin 0.8 0.3 - 1.2 mg/dL   GFR calc non Af Amer >60 >60 mL/min   GFR calc Af Amer >60 >60 mL/min    Comment: (NOTE) The eGFR has been calculated using the CKD EPI equation. This calculation has not been validated in all clinical situations. eGFR's persistently <60 mL/min signify possible Chronic Kidney Disease.    Anion gap 11 5 - 15  Troponin I     Status: None   Collection Time: 02/10/16  4:51 AM  Result Value Ref Range   Troponin I 0.03 <0.031 ng/mL    Comment:        NO INDICATION OF MYOCARDIAL INJURY.   Folate     Status: None   Collection Time: 02/10/16  7:57 AM  Result Value Ref Range   Folate 14.7 >5.9 ng/mL  Vitamin B12     Status: None   Collection Time: 02/10/16  7:57 AM  Result Value Ref Range   Vitamin B-12 363 180 - 914 pg/mL    Comment: (NOTE) This assay is not validated for testing neonatal or myeloproliferative syndrome  specimens for Vitamin B12 levels.   RPR     Status: None   Collection Time: 02/10/16  7:57 AM  Result Value Ref Range   RPR Ser Ql Non Reactive Non Reactive    Comment: (NOTE) Performed At: [facility] [address] [place], [place] [id number] Lindon Romp MD [id number]   Ammonia     Status: None   Collection Time: 02/10/16  7:57 AM  Result Value Ref Range   Ammonia 15 9 - 35 umol/L  HIV antibody (routine testing) (NOT for [facility])     Status: None   Collection Time: 02/10/16  7:57 AM  Result Value Ref Range   HIV Screen 4th Generation wRfx  Non Reactive Non Reactive    Comment: (NOTE) Performed At: [facility] [place], [place] [id number] Lindon Romp MD [id number]   I-Stat arterial blood gas, ED     Status: Abnormal   Collection Time: 02/10/16  8:31 AM  Result Value Ref Range   pH, Arterial 7.452 (H) 7.350 - 7.450   pCO2 arterial 38.7 35.0 - 45.0 mmHg   pO2, Arterial 66.0 (L) 80.0 - 100.0 mmHg   Bicarbonate 27.1 (H) 20.0 - 24.0 mEq/L   TCO2 28 0 - 100 mmol/L   O2 Saturation 94.0 %   Acid-Base Excess 3.0 (H) 0.0 - 2.0 mmol/L   Patient temperature 98.6 F    Collection site RADIAL, ALLEN'S TEST ACCEPTABLE    Drawn by RT    Sample type ARTERIAL   TSH     Status: None   Collection Time: 02/10/16  8:44 AM  Result Value Ref Range   TSH 1.323 0.350 - 4.500 uIU/mL  Creatinine, serum     Status: None   Collection Time: 02/10/16  9:38 AM  Result Value Ref Range   Creatinine, Ser 1.00 0.61 - 1.24 mg/dL   GFR calc non Af Amer >60 >60 mL/min   GFR calc Af Amer >60 >60 mL/min    Comment: (NOTE) The eGFR has been calculated using the CKD EPI equation. This calculation has not been validated in all clinical situations. eGFR's persistently <60 mL/min signify possible Chronic Kidney Disease.   Magnesium     Status: None   Collection Time: 02/10/16  9:38 AM  Result Value Ref Range   Magnesium 2.0 1.7 - 2.4 mg/dL  Phosphorus      Status: None   Collection Time: 02/10/16  9:38 AM  Result Value Ref Range   Phosphorus 3.2 2.5 - 4.6 mg/dL  CBC     Status: Abnormal   Collection Time: 02/11/16  4:20 AM  Result Value Ref Range   WBC 7.6 4.0 - 10.5 K/uL   RBC 3.64 (L) 4.22 - 5.81 MIL/uL   Hemoglobin 11.2 (L) 13.0 - 17.0 g/dL   HCT 34.6 (L) 39.0 - 52.0 %   MCV 95.1 78.0 - 100.0 fL   MCH 30.8 26.0 - 34.0 pg   MCHC 32.4 30.0 - 36.0 g/dL   RDW 12.7 11.5 - 15.5 %   Platelets 148 (L) 150 - 400 K/uL  Basic metabolic panel     Status: Abnormal   Collection Time: 02/11/16  4:20 AM  Result Value Ref Range   Sodium 135 135 - 145 mmol/L   Potassium 3.6 3.5 - 5.1 mmol/L   Chloride 103 101 - 111 mmol/L   CO2 25 22 - 32 mmol/L   Glucose, Bld 156 (H) 65 - 99 mg/dL   BUN 15 6 - 20 mg/dL   Creatinine, Ser 0.84 0.61 - 1.24 mg/dL   Calcium 8.9 8.9 - 10.3 mg/dL   GFR calc non Af Amer >60 >60 mL/min   GFR calc Af Amer >60 >60 mL/min    Comment: (NOTE) The eGFR has been calculated using the CKD EPI equation. This calculation has not been validated in all clinical situations. eGFR's persistently <60 mL/min signify possible Chronic Kidney Disease.    Anion gap 7 5 - 15     Lipid Panel     Component Value Date/Time   CHOL 135 04/11/2015 0857   TRIG 65.0 04/11/2015 0857   HDL 59.40 04/11/2015 0857   CHOLHDL 2 04/11/2015 0857  VLDL 13.0 04/11/2015 0857   LDLCALC 63 04/11/2015 0857     Lab Results  Component Value Date   HGBA1C 5.7 04/11/2015   HGBA1C 6.3 11/05/2014   HGBA1C 5.8 01/29/2014     Lab Results  Component Value Date   MICROALBUR 3.1* 02/27/2013   LDLCALC 63 04/11/2015   CREATININE 0.84 02/11/2016     Brief summary 82 y.o. male presenting with intermittent delirium and confusional state. History is provided by EDP, patient and patient's wife. Per report patient was found by his wife 3 days ago after falling in a doorway and striking his head. This caused small abrasion and hematoma formation on the  scalp and forehead. No reported LOC but unclear if this did happen. Intermittently since that time patient has had periods of confusion and auditory and visual hallucinations. These happen both at night and in the early morning. Currently patient is alert and oriented 3. Patient denies any focal complaints at this point time including dysuria, frequency, chest pain, worsening shortness of breath, back pain, rash, fevers, cough, headache, neck stiffness. No recent URI type symptoms reported. Patient does report some urge incontinence which has been ongoing for several weeks. Patient reports drinking 2-4 bottles of beer nightly for the last 70 years at baseline. Patient reports that he does get short of breath at baseline but does not use home oxygen.  Assessment and plan Intermittent Delirium: Suspect Wernicke's encephalopathy versus sundowning with undiagnosed underlying dementia versus hypoxemia from underlying COPD vs severe concussion or stroke. MRI of the brain did not show any acute stroke Workup unrevealing from a metabolic or infectious standpoint. Patient is positive for opiates on yes without evidence of these being given in the ED and no home reported opiate use. EtOH level normal. ABG just returned showing showing pH 7.45, PCO2 38, PO2 66, bicarbonate 27.  - MedSurg, - Folate, B12, thiamine, RPR - Start Seroquel daily at bedtime - MRI brain negative - outpt MMSE - Ammonia 15, TSH normal, vitamin B-12 363, - PT/OT Psychiatric consultation for capacity evaluation, dementia evaluation Cont seroquel for sundowning    History of DVT/PE IVC filter placement on 12/14 by Dr. Leotis Pain. Bilateral lower extremity Doppler on 12/13 showed nearly occlusive thrombus involving the central portion of the left femoral vein and profunda femoral vein. No evidence of right lower extremity DVT, currently not on anticoagulation  History of alcohol abuse Continue CIWA protocol LIBRIUM protocol for  withdrawal   HTN:  - continue cozaar, metop,   COPD: at baseline - outpt PFT - ambulate pt for home O2 - continue spiriva, albuterol  HLD: - continue simvastatin  Depression: - continue celexa     Discharge Exam:    Blood pressure 168/65, pulse 70, temperature 97.7 F (36.5 C), temperature source Oral, resp. rate 18, height _0  (1.753 m), weight 100.245 kg (221 lb), SpO2 95 %.    General: Appears calm and comfortable  Eyes: PERRL, EOMI, normal lids, iris  ENT: grossly normal hearing, lips & tongue  Neck: no LAD, masses or thyromegaly  Cardiovascular: RRR, no m/r/g. No LE edema.   Respiratory: CTA bilaterally, no w/r/r. Normal respiratory effort.  Abdomen: soft, ntnd Skin: no rash or induration seen on limited exam     Signed: Wrigley Plasencia 02/11/2016, 11:13 AM        Time spent >45 mins

## 2016-02-11 NOTE — Progress Notes (Signed)
D/c to home w/ wife via w/c voices no c/o all belongings and d/c instructions given

## 2016-02-11 NOTE — Progress Notes (Signed)
Spoke to Dr Allyson Sabal  Will do Psy outpatient ok to d/c

## 2016-02-11 NOTE — Progress Notes (Signed)
SATURATION QUALIFICATIONS: (This note is used to comply with regulatory documentation for home oxygen)  Patient Saturations on Room Air at Rest = 93%%  Patient Saturations on Room Air while Ambulating = 90%  Patient Saturations on - Liters of oxygen while Ambulating = -%  Please briefly explain why patient needs home oxygen:  DOES NOT NEED HOME O2   02/11/2016 Barry Brunner, PT Pager: [phone]

## 2016-02-12 ENCOUNTER — Telehealth: Payer: Self-pay | Admitting: *Deleted

## 2016-02-12 LAB — VITAMIN B1: Vitamin B1 (Thiamine): 159.4 nmol/L (ref 66.5–200.0)

## 2016-02-12 NOTE — Telephone Encounter (Signed)
Transition Care Management Follow-up Telephone Call   Date discharged?   How have you been since you were released from the hospital? Feeling weak, improving.   Do you understand why you were in the hospital? yes   Do you understand the discharge instructions? yes   Where were you discharged to? home   Items Reviewed:  Medications reviewed: yes  Allergies reviewed: yes  Dietary changes reviewed: no  Referrals reviewed: no   Functional Questionnaire:   Activities of Daily Living (ADLs):   He states they are independent in the following: ambulation, bathing and hygiene, feeding, continence, grooming, toileting and dressing States they require assistance with the following: none   Any transportation issues/concerns?: no   Any patient concerns? yes, medication changes - he prefers to not take any new medications until after speaking with PCP at f/u   Confirmed importance and date/time of follow-up visits scheduled yes, 02/14/16 @ 1145  Provider Appointment booked with G. Renford Dills, MD  Confirmed with patient if condition begins to worsen call PCP or go to the ER.  Patient was given the office number and encouraged to call back with question or concerns.  : yes

## 2016-02-14 ENCOUNTER — Ambulatory Visit: Payer: Medicare Other | Admitting: Family Medicine

## 2016-02-17 DIAGNOSIS — M62830 Muscle spasm of back: Secondary | ICD-10-CM | POA: Diagnosis not present

## 2016-02-17 DIAGNOSIS — M9903 Segmental and somatic dysfunction of lumbar region: Secondary | ICD-10-CM | POA: Diagnosis not present

## 2016-02-17 DIAGNOSIS — M5136 Other intervertebral disc degeneration, lumbar region: Secondary | ICD-10-CM | POA: Diagnosis not present

## 2016-02-17 DIAGNOSIS — M9901 Segmental and somatic dysfunction of cervical region: Secondary | ICD-10-CM | POA: Diagnosis not present

## 2016-02-18 ENCOUNTER — Encounter: Payer: Self-pay | Admitting: Family Medicine

## 2016-02-18 ENCOUNTER — Ambulatory Visit (INDEPENDENT_AMBULATORY_CARE_PROVIDER_SITE_OTHER): Payer: Medicare Other | Admitting: Family Medicine

## 2016-02-18 VITALS — BP 128/52 | HR 68 | Temp 98.3°F | Wt 220.8 lb

## 2016-02-18 DIAGNOSIS — Z789 Other specified health status: Secondary | ICD-10-CM | POA: Diagnosis not present

## 2016-02-18 DIAGNOSIS — S060X0S Concussion without loss of consciousness, sequela: Secondary | ICD-10-CM

## 2016-02-18 DIAGNOSIS — Z7289 Other problems related to lifestyle: Secondary | ICD-10-CM

## 2016-02-18 MED ORDER — CITALOPRAM HYDROBROMIDE 40 MG PO TABS: 60.0000 mg | ORAL_TABLET | Freq: Every day | ORAL | Status: DC

## 2016-02-18 NOTE — Patient Instructions (Addendum)
Call your insurance and see if they will cover another med that is cheaper than SPIRIVA.   Ask your wife what she thinks about your memory.  I think you had a concussion.   Your med list is updated.

## 2016-02-18 NOTE — Progress Notes (Signed)
Pre visit review using our clinic review tool, if applicable. No additional management support is needed unless otherwise documented below in the visit note.    Admit date: 02/10/2016 Discharge date: 02/11/2016  Discharge Diagnoses:    Active Problems:  Depression  Essential hypertension  COPD (chronic obstructive pulmonary disease) (HCC)  Delirium  ETOH abuse  HLD (hyperlipidemia)    Follow-up recommendations Follow-up with PCP in 3-5 days , including all additional recommended appointments as below Follow-up CBC, CMP in 3-5 days Alcohol cessation counseling done Discussed with the patient's wife prior to DC      Discharge Condition: Stable    Discharge Instructions Get Medicines reviewed and adjusted: Please take all your medications with you for your next visit with your Primary MD  Please request your Primary MD to go over all hospital tests and procedure/radiological results at the follow up, please ask your Primary MD to get all Hospital records sent to his/her office.  If you experience worsening of your admission symptoms, develop shortness of breath, life threatening emergency, suicidal or homicidal thoughts you must seek medical attention immediately by calling 911 or calling your MD immediately if symptoms less severe.  You must read complete instructions/literature along with all the possible adverse reactions/side effects for all the Medicines you take and that have been prescribed to you. Take any new Medicines after you have completely understood and accpet all the possible adverse reactions/side effects.   Do not drive when taking Pain medications.   Do not take more than prescribed Pain, Sleep and Anxiety Medications  Special Instructions: If you have smoked or chewed Tobacco in the last 2 yrs please stop smoking, stop any regular Alcohol and or any Recreational drug use.  Wear Seat belts while driving.  Please note  You were cared for  by a hospitalist during your hospital stay. Once you are discharged, your primary care physician will handle any further medical issues. Please note that NO REFILLS for any discharge medications will be authorized once you are discharged, as it is imperative that you return to your primary care physician (or establish a relationship with a primary care physician if you do not have one) for your aftercare needs so that they can reassess your need for medications and monitor your lab values.      Discharge Instructions    Diet - low sodium heart healthy  Complete by: As directed      Increase activity slowly  Complete by: As directed           Current Discharge Medication List    START taking these medications   Details  folic acid (FOLVITE) 1 MG tablet Take 1 tablet (1 mg total) by mouth daily. Qty: 30 tablet, Refills: 0    QUEtiapine (SEROQUEL) 50 MG tablet Take 0.5 tablets (25 mg total) by mouth at bedtime. Qty: 30 tablet, Refills: 0    thiamine 100 MG tablet Take 1 tablet (100 mg total) by mouth daily. Qty: 30 tablet, Refills: 0      CONTINUE these medications which have CHANGED   Details  citalopram (CELEXA) 40 MG tablet Take 1 tablet (40 mg total) by mouth daily. Qty: 180 tablet, Refills: 2      CONTINUE these medications which have NOT CHANGED   Details  albuterol (PROVENTIL HFA;VENTOLIN HFA) 108 (90 BASE) MCG/ACT inhaler Inhale 2 puffs into the lungs every 6 (six) hours as needed for wheezing or shortness of breath. Qty: 18 g, Refills: 12  allopurinol (ZYLOPRIM) 100 MG tablet Take 1 tablet (100 mg total) by mouth daily. Qty: 120 tablet, Refills: 2    aspirin 81 MG EC tablet Take 81 mg by mouth daily.     Cholecalciferol (VITAMIN D3) 1000 UNITS tablet Take 2,000 Units by mouth daily.     ipratropium (ATROVENT) 0.03 % nasal spray Place 2 sprays into both nostrils every 12 (twelve) hours. Qty: 30 mL,  Refills: 12    losartan (COZAAR) 25 MG tablet TAKE ONE TABLET BY MOUTH EVERY DAY Qty: 30 tablet, Refills: 3    metoprolol tartrate (LOPRESSOR) 25 MG tablet Take 1 tablet (25 mg total) by mouth 2 (two) times daily. Qty: 240 tablet, Refills: 1    simvastatin (ZOCOR) 20 MG tablet Take 1 tablet (20 mg total) by mouth at bedtime. Qty: 120 tablet, Refills: 3    tiotropium (SPIRIVA HANDIHALER) 18 MCG inhalation capsule PLACE 1 CAPSULE INTO INHALER AND INHALE DAILY Qty: 120 capsule, Refills: 1    triamcinolone (NASACORT ALLERGY 24HR) 55 MCG/ACT AERO nasal inhaler Place 2 sprays into the nose at bedtime as needed. Qty: 1 Inhaler, Refills: 5       Allergies  Allergen Reactions  . Tessalon [Benzonatate]     Ineffective, nasal congestion      Disposition:    Consults:  Psychiatry to determine capacity, rule out dementia     Significant Diagnostic Studies:   Imaging Results    Dg Chest 1 View  02/10/2016 CLINICAL DATA: [place] about 1 week ago. Patient's wife reports increasing confusion yesterday. EXAM: CHEST 1 VIEW COMPARISON: 01/09/2015 FINDINGS: There is linear left base opacity due to scarring or atelectasis. The right lung is clear except for stable upper lobe nodule. Hilar and mediastinal contours are unremarkable and unchanged. Pulmonary vasculature is normal. IMPRESSION: Linear scarring or atelectasis in the left base. Electronically Signed By: Andreas Newport M.D. On: 02/10/2016 04:27   Dg Thoracic Spine 4v  02/10/2016 CLINICAL DATA: [place] at home last week EXAM: THORACIC SPINE - 4+ VIEW COMPARISON: None. FINDINGS: There is no evidence of thoracic spine fracture. Alignment is normal. There is slight anterior compression of T4 which is unchanged from 11/07/2015 CT. No acute fracture is evident. No bone lesion or bony destruction. IMPRESSION: Negative for acute thoracic spine fracture Electronically Signed By: Andreas Newport M.D. On:  02/10/2016 04:30   Dg Lumbar Spine Complete  02/10/2016 CLINICAL DATA: [place] at home last week. EXAM: LUMBAR SPINE - COMPLETE 4+ VIEW COMPARISON: None. FINDINGS: The lumbar vertebrae are normal in height. No evidence of an acute lumbar spine fracture. There is grade 1 spondylolisthesis at L4, likely degenerative. There is no bone lesion or bony destruction. IVC filter incidentally noted. Infrarenal abdominal aortic aneurysm incidentally noted. IMPRESSION: Negative for acute lumbar spine fracture. Infrarenal abdominal aortic aneurysm, probably less than 4 cm based on the calcifications. Electronically Signed By: Andreas Newport M.D. On: 02/10/2016 04:33   Dg Pelvis 1-2 Views  02/10/2016 CLINICAL DATA: [place] at home last week EXAM: PELVIS - 1-2 VIEW COMPARISON: None. FINDINGS: There is no evidence of pelvic fracture or diastasis. No pelvic bone lesions are seen. IMPRESSION: Negative. Electronically Signed By: Andreas Newport M.D. On: 02/10/2016 04:31   Ct Head Wo Contrast  02/10/2016 CLINICAL DATA: [place] and hit head at home 1 week ago. Yesterday began showing increasing confusion. EXAM: CT HEAD WITHOUT CONTRAST CT CERVICAL SPINE WITHOUT CONTRAST TECHNIQUE: Multidetector CT imaging of the head and cervical spine was performed following the standard protocol  without intravenous contrast. Multiplanar CT image reconstructions of the cervical spine were also generated. COMPARISON: 08/27/2009 FINDINGS: CT HEAD FINDINGS There is no intracranial hemorrhage or extra-axial fluid collection. There is moderately severe generalized atrophy which has worsened from 08/27/2009. There is white matter hypodensity which appears chronic, likely due to small vessel disease. This is also worsened. There is no extra-axial fluid collection. The calvarium and skullbase are intact. There is mild membrane thickening of the left frontal sinus and several of the ethmoid air cells. CT CERVICAL SPINE FINDINGS The  vertebral column, pedicles and facet articulations are intact. There is no evidence of acute fracture. No acute soft tissue abnormalities are evident. Moderate degenerative cervical disc disease is present from C5 through C7. IMPRESSION: 1. Negative for acute intracranial traumatic injury. There is moderately severe generalized atrophy and chronic white matter hypodensity, likely due to small vessel disease. 2. Negative for acute cervical spine fracture. Electronically Signed By: Andreas Newport M.D. On: 02/10/2016 04:24   Ct Cervical Spine Wo Contrast  02/10/2016 CLINICAL DATA: [place] and hit head at home 1 week ago. Yesterday began showing increasing confusion. EXAM: CT HEAD WITHOUT CONTRAST CT CERVICAL SPINE WITHOUT CONTRAST TECHNIQUE: Multidetector CT imaging of the head and cervical spine was performed following the standard protocol without intravenous contrast. Multiplanar CT image reconstructions of the cervical spine were also generated. COMPARISON: 08/27/2009 FINDINGS: CT HEAD FINDINGS There is no intracranial hemorrhage or extra-axial fluid collection. There is moderately severe generalized atrophy which has worsened from 08/27/2009. There is white matter hypodensity which appears chronic, likely due to small vessel disease. This is also worsened. There is no extra-axial fluid collection. The calvarium and skullbase are intact. There is mild membrane thickening of the left frontal sinus and several of the ethmoid air cells. CT CERVICAL SPINE FINDINGS The vertebral column, pedicles and facet articulations are intact. There is no evidence of acute fracture. No acute soft tissue abnormalities are evident. Moderate degenerative cervical disc disease is present from C5 through C7. IMPRESSION: 1. Negative for acute intracranial traumatic injury. There is moderately severe generalized atrophy and chronic white matter hypodensity, likely due to small vessel disease. 2. Negative for acute cervical spine  fracture. Electronically Signed By: Andreas Newport M.D. On: 02/10/2016 04:24   Mr Brain Wo Contrast  02/10/2016 CLINICAL DATA: Confusion with auditory and visual hallucinations. Recent fall with trauma to the head. EXAM: MRI HEAD WITHOUT CONTRAST TECHNIQUE: Multiplanar, multiecho pulse sequences of the brain and surrounding structures were obtained without intravenous contrast. COMPARISON: Head CT same day FINDINGS: Diffusion imaging does not show any acute or subacute infarction. There chronic small-vessel ischemic changes of the pons. There are a few old small vessel cerebellar infarctions. There are a few old small vessel infarctions affecting the thalami in there are mild chronic small-vessel ischemic changes of the cerebral hemispheric deep white matter. No cortical or large vessel territory infarction. No mass lesion, hemorrhage, hydrocephalus or extra-axial collection. There is generalized brain atrophy. CP angle regions are normal. No pituitary mass. No inflammatory sinus disease. Major vessels at the base of the brain show flow. IMPRESSION: No acute, focal or reversible finding. Atrophy and chronic small vessel ischemic changes as outlined above. Electronically Signed By: Nelson Chimes M.D. On: 02/10/2016 10:43        Filed Weights   02/10/16 0250  Weight: 100.245 kg (221 lb)     Microbiology: No results found for this or any previous visit (from the past 240 hour(s)).  Blood Culture  Labs (Brief)    No results found for: SDES, SPECREQUEST, CULT, REPTSTATUS      Labs:  Lab Results Last 48 Hours    Results for orders placed or performed during the hospital encounter of 02/10/16 (from the past 48 hour(s))  Urine rapid drug screen (hosp performed)not at [facility] Status: Abnormal   Collection Time: 02/10/16 4:35 AM  Result Value Ref Range   Opiates POSITIVE (A) NONE DETECTED   Cocaine NONE DETECTED NONE DETECTED   Benzodiazepines NONE  DETECTED NONE DETECTED   Amphetamines NONE DETECTED NONE DETECTED   Tetrahydrocannabinol NONE DETECTED NONE DETECTED   Barbiturates NONE DETECTED NONE DETECTED    Comment:   DRUG SCREEN FOR MEDICAL PURPOSES ONLY. IF CONFIRMATION IS NEEDED FOR ANY PURPOSE, NOTIFY LAB WITHIN 5 DAYS.   LOWEST DETECTABLE LIMITS FOR URINE DRUG SCREEN Drug Class Cutoff (ng/mL) Amphetamine 1000 Barbiturate 200 Benzodiazepine 258 Tricyclics 527 Opiates 782 Cocaine 300 THC 50   Urinalysis, Routine w reflex microscopic (not at [facility]) Status: None   Collection Time: 02/10/16 4:35 AM  Result Value Ref Range   Color, Urine YELLOW YELLOW   APPearance CLEAR CLEAR   Specific Gravity, Urine 1.017 1.005 - 1.030   pH 5.5 5.0 - 8.0   Glucose, UA NEGATIVE NEGATIVE mg/dL   Hgb urine dipstick NEGATIVE NEGATIVE   Bilirubin Urine NEGATIVE NEGATIVE   Ketones, ur NEGATIVE NEGATIVE mg/dL   Protein, ur NEGATIVE NEGATIVE mg/dL   Nitrite NEGATIVE NEGATIVE   Leukocytes, UA NEGATIVE NEGATIVE    Comment: MICROSCOPIC NOT DONE ON URINES WITH NEGATIVE PROTEIN, BLOOD, LEUKOCYTES, NITRITE, OR GLUCOSE <1000 mg/dL.  Type and screen [place] Status: None   Collection Time: 02/10/16 4:35 AM  Result Value Ref Range   ABO/RH(D) O POS    Antibody Screen NEG    Sample Expiration 02/13/2016   Ethanol Status: None   Collection Time: 02/10/16 4:50 AM  Result Value Ref Range   Alcohol, Ethyl (B) <5 <5 mg/dL    Comment:   LOWEST DETECTABLE LIMIT FOR SERUM ALCOHOL IS 5 mg/dL FOR MEDICAL PURPOSES ONLY   Protime-INR Status: Abnormal   Collection Time: 02/10/16 4:51 AM  Result Value Ref Range   Prothrombin Time 15.7 (H) 11.6 - 15.2 seconds   INR 1.24 0.00 - 1.49  APTT Status: None   Collection Time:  02/10/16 4:51 AM  Result Value Ref Range   aPTT 28 24 - 37 seconds  CBC Status: Abnormal   Collection Time: 02/10/16 4:51 AM  Result Value Ref Range   WBC 8.9 4.0 - 10.5 K/uL   RBC 3.71 (L) 4.22 - 5.81 MIL/uL   Hemoglobin 11.4 (L) 13.0 - 17.0 g/dL   HCT 35.5 (L) 39.0 - 52.0 %   MCV 95.7 78.0 - 100.0 fL   MCH 30.7 26.0 - 34.0 pg   MCHC 32.1 30.0 - 36.0 g/dL   RDW 13.0 11.5 - 15.5 %   Platelets 159 150 - 400 K/uL  Differential Status: Abnormal   Collection Time: 02/10/16 4:51 AM  Result Value Ref Range   Neutrophils Relative % 70 %   Neutro Abs 6.1 1.7 - 7.7 K/uL   Lymphocytes Relative 15 %   Lymphs Abs 1.3 0.7 - 4.0 K/uL   Monocytes Relative 15 %   Monocytes Absolute 1.4 (H) 0.1 - 1.0 K/uL   Eosinophils Relative 0 %   Eosinophils Absolute 0.0 0.0 - 0.7 K/uL   Basophils Relative 0 %   Basophils  Absolute 0.0 0.0 - 0.1 K/uL  Comprehensive metabolic panel Status: Abnormal   Collection Time: 02/10/16 4:51 AM  Result Value Ref Range   Sodium 136 135 - 145 mmol/L   Potassium 3.7 3.5 - 5.1 mmol/L   Chloride 97 (L) 101 - 111 mmol/L   CO2 28 22 - 32 mmol/L   Glucose, Bld 142 (H) 65 - 99 mg/dL   BUN 15 6 - 20 mg/dL   Creatinine, Ser 1.02 0.61 - 1.24 mg/dL   Calcium 9.3 8.9 - 10.3 mg/dL   Total Protein 6.6 6.5 - 8.1 g/dL   Albumin 2.9 (L) 3.5 - 5.0 g/dL   AST 26 15 - 41 U/L   ALT 23 17 - 63 U/L   Alkaline Phosphatase 37 (L) 38 - 126 U/L   Total Bilirubin 0.8 0.3 - 1.2 mg/dL   GFR calc non Af Amer >60 >60 mL/min   GFR calc Af Amer >60 >60 mL/min    Comment: (NOTE) The eGFR has been calculated using the CKD EPI equation. This calculation has not been validated in all clinical situations. eGFR's persistently <60 mL/min signify possible Chronic Kidney Disease.    Anion gap 11 5 - 15  Troponin  I Status: None   Collection Time: 02/10/16 4:51 AM  Result Value Ref Range   Troponin I 0.03 <0.031 ng/mL    Comment:   NO INDICATION OF MYOCARDIAL INJURY.   Folate Status: None   Collection Time: 02/10/16 7:57 AM  Result Value Ref Range   Folate 14.7 >5.9 ng/mL  Vitamin B12 Status: None   Collection Time: 02/10/16 7:57 AM  Result Value Ref Range   Vitamin B-12 363 180 - 914 pg/mL    Comment: (NOTE) This assay is not validated for testing neonatal or myeloproliferative syndrome specimens for Vitamin B12 levels.   RPR Status: None   Collection Time: 02/10/16 7:57 AM  Result Value Ref Range   RPR Ser Ql Non Reactive Non Reactive    Comment: (NOTE) Performed At: [facility] [address] [place], [place] [id number] Lindon Romp MD [id number]   Ammonia Status: None   Collection Time: 02/10/16 7:57 AM  Result Value Ref Range   Ammonia 15 9 - 35 umol/L  HIV antibody (routine testing) (NOT for [facility]) Status: None   Collection Time: 02/10/16 7:57 AM  Result Value Ref Range   HIV Screen 4th Generation wRfx Non Reactive Non Reactive    Comment: (NOTE) Performed At: [facility] [place], [place] [id number] Lindon Romp MD [id number]   I-Stat arterial blood gas, ED Status: Abnormal   Collection Time: 02/10/16 8:31 AM  Result Value Ref Range   pH, Arterial 7.452 (H) 7.350 - 7.450   pCO2 arterial 38.7 35.0 - 45.0 mmHg   pO2, Arterial 66.0 (L) 80.0 - 100.0 mmHg   Bicarbonate 27.1 (H) 20.0 - 24.0 mEq/L   TCO2 28 0 - 100 mmol/L   O2 Saturation 94.0 %   Acid-Base Excess 3.0 (H) 0.0 - 2.0 mmol/L   Patient temperature 98.6 F    Collection site RADIAL, ALLEN'S TEST ACCEPTABLE    Drawn by RT    Sample type ARTERIAL   TSH Status: None   Collection Time: 02/10/16 8:44  AM  Result Value Ref Range   TSH 1.323 0.350 - 4.500 uIU/mL  Creatinine, serum Status: None   Collection Time: 02/10/16 9:38 AM  Result Value Ref Range   Creatinine, Ser 1.00 0.61 - 1.24 mg/dL  GFR calc non Af Amer >60 >60 mL/min   GFR calc Af Amer >60 >60 mL/min    Comment: (NOTE) The eGFR has been calculated using the CKD EPI equation. This calculation has not been validated in all clinical situations. eGFR's persistently <60 mL/min signify possible Chronic Kidney Disease.   Magnesium Status: None   Collection Time: 02/10/16 9:38 AM  Result Value Ref Range   Magnesium 2.0 1.7 - 2.4 mg/dL  Phosphorus Status: None   Collection Time: 02/10/16 9:38 AM  Result Value Ref Range   Phosphorus 3.2 2.5 - 4.6 mg/dL  CBC Status: Abnormal   Collection Time: 02/11/16 4:20 AM  Result Value Ref Range   WBC 7.6 4.0 - 10.5 K/uL   RBC 3.64 (L) 4.22 - 5.81 MIL/uL   Hemoglobin 11.2 (L) 13.0 - 17.0 g/dL   HCT 34.6 (L) 39.0 - 52.0 %   MCV 95.1 78.0 - 100.0 fL   MCH 30.8 26.0 - 34.0 pg   MCHC 32.4 30.0 - 36.0 g/dL   RDW 12.7 11.5 - 15.5 %   Platelets 148 (L) 150 - 400 K/uL  Basic metabolic panel Status: Abnormal   Collection Time: 02/11/16 4:20 AM  Result Value Ref Range   Sodium 135 135 - 145 mmol/L   Potassium 3.6 3.5 - 5.1 mmol/L   Chloride 103 101 - 111 mmol/L   CO2 25 22 - 32 mmol/L   Glucose, Bld 156 (H) 65 - 99 mg/dL   BUN 15 6 - 20 mg/dL   Creatinine, Ser 0.84 0.61 - 1.24 mg/dL   Calcium 8.9 8.9 - 10.3 mg/dL   GFR calc non Af Amer >60 >60 mL/min   GFR calc Af Amer >60 >60 mL/min    Comment: (NOTE) The eGFR has been calculated using the CKD EPI equation. This calculation has not been validated in all clinical situations. eGFR's persistently <60 mL/min signify possible Chronic Kidney Disease.    Anion gap 7 5 -  15       Lipid Panel   Labs (Brief)       Component Value Date/Time   CHOL 135 04/11/2015 0857   TRIG 65.0 04/11/2015 0857   HDL 59.40 04/11/2015 0857   CHOLHDL 2 04/11/2015 0857   VLDL 13.0 04/11/2015 0857   LDLCALC 63 04/11/2015 0857        Recent Labs    Lab Results  Component Value Date   HGBA1C 5.7 04/11/2015   HGBA1C 6.3 11/05/2014   HGBA1C 5.8 01/29/2014        Recent Labs    Lab Results  Component Value Date   MICROALBUR 3.1* 02/27/2013   LDLCALC 63 04/11/2015   CREATININE 0.84 02/11/2016       Brief summary 82 y.o. male presenting with intermittent delirium and confusional state. History is provided by EDP, patient and patient's wife. Per report patient was found by his wife 3 days ago after falling in a doorway and striking his head. This caused small abrasion and hematoma formation on the scalp and forehead. No reported LOC but unclear if this did happen. Intermittently since that time patient has had periods of confusion and auditory and visual hallucinations. These happen both at night and in the early morning. Currently patient is alert and oriented 3. Patient denies any focal complaints at this point time including dysuria, frequency, chest pain, worsening shortness of breath, back pain, rash, fevers, cough, headache, neck stiffness. No recent URI type symptoms reported. Patient does report some urge  incontinence which has been ongoing for several weeks. Patient reports drinking 2-4 bottles of beer nightly for the last 70 years at baseline. Patient reports that he does get short of breath at baseline but does not use home oxygen.  Assessment and plan Intermittent Delirium: Suspect Wernicke's encephalopathy versus sundowning with undiagnosed underlying dementia versus hypoxemia from underlying COPD vs severe concussion or stroke. MRI of the brain did not show any acute stroke Workup  unrevealing from a metabolic or infectious standpoint. Patient is positive for opiates on yes without evidence of these being given in the ED and no home reported opiate use. EtOH level normal. ABG just returned showing showing pH 7.45, PCO2 38, PO2 66, bicarbonate 27.  - MedSurg, - Folate, B12, thiamine, RPR - Start Seroquel daily at bedtime - MRI brain negative - outpt MMSE - Ammonia 15, TSH normal, vitamin B-12 363, - PT/OT Psychiatric consultation for capacity evaluation, dementia evaluation Cont seroquel for sundowning    History of DVT/PE IVC filter placement on 12/14 by Dr. Leotis Pain. Bilateral lower extremity Doppler on 12/13 showed nearly occlusive thrombus involving the central portion of the left femoral vein and profunda femoral vein. No evidence of right lower extremity DVT, currently not on anticoagulation  History of alcohol abuse Continue CIWA protocol LIBRIUM protocol for withdrawal   HTN:  - continue cozaar, metop,   COPD: at baseline - outpt PFT - ambulate pt for home O2 - continue spiriva, albuterol  HLD: - continue simvastatin  Depression: - continue celexa      The above d/w pt.  He remembers the event.  [address], hit his head, then started having abnormal thoughts.  Wife called 911, sent to ER, imaging neg.  MRI neg.  He feels better now.  Still with some "fogginess" in the head, but his ability to concentrate is much better.  He has felt better daily in the meantime.    He isn't on seroquel.  He did better on 51m of citalopram prev.    Daily alcohol use.  He doesn't have withdrawal sx, maladaptive behavior.  He isn't drinking to intoxication.  Usually ~2 beers a day, long term.  D/w pt.  Based on the interview, he does have daily use, most days, but doesn't appear to be an alcoholic.  D/w pt.    PMH and SH reviewed  ROS: See HPI, otherwise noncontributory.  Meds, vitals, and allergies reviewed.   GEN: nad, alert and oriented, healing abrasion on  the scalp noted.  HEENT: mucous membranes moist NECK: supple w/o LA CV: rrr PULM: ctab, no inc wob ABD: soft, +bs EXT: no edema SKIN: no acute rash A&O x3, normal recall, math, watch reading.

## 2016-02-20 ENCOUNTER — Telehealth: Payer: Self-pay | Admitting: Family Medicine

## 2016-02-20 DIAGNOSIS — S060X0A Concussion without loss of consciousness, initial encounter: Secondary | ICD-10-CM | POA: Insufficient documentation

## 2016-02-20 DIAGNOSIS — S060X0S Concussion without loss of consciousness, sequela: Secondary | ICD-10-CM

## 2016-02-20 DIAGNOSIS — R7989 Other specified abnormal findings of blood chemistry: Secondary | ICD-10-CM

## 2016-02-20 DIAGNOSIS — Z789 Other specified health status: Secondary | ICD-10-CM | POA: Insufficient documentation

## 2016-02-20 DIAGNOSIS — Z7289 Other problems related to lifestyle: Secondary | ICD-10-CM | POA: Insufficient documentation

## 2016-02-20 NOTE — Telephone Encounter (Signed)
I meant for him to get f/u labs on the way out at the [facility].  I didn't send him to the labs.  Orders are in.  Can he home by at some point? Nonfasting.  Thanks.

## 2016-02-20 NOTE — Assessment & Plan Note (Signed)
D/w pt.  Daily use, not to excess.  He doesn't want to stop. "I like drinking beer."  He is aware of risk of use.  D/w pt.

## 2016-02-20 NOTE — Assessment & Plan Note (Signed)
Likely complicated by multifactorial delirium after the event.  D/w pt.  Okay for outpatient f/u.  At this point, all sx resolved. He had not etoh w/d sx.  He has no sx now.   Normal brief memory testing. dw pt about fall cautions.

## 2016-02-20 NOTE — Telephone Encounter (Signed)
Patient advised.  Lab appt scheduled.  

## 2016-02-21 ENCOUNTER — Other Ambulatory Visit (INDEPENDENT_AMBULATORY_CARE_PROVIDER_SITE_OTHER): Payer: Medicare Other

## 2016-02-21 ENCOUNTER — Ambulatory Visit (INDEPENDENT_AMBULATORY_CARE_PROVIDER_SITE_OTHER): Payer: Medicare Other | Admitting: *Deleted

## 2016-02-21 DIAGNOSIS — S060X0S Concussion without loss of consciousness, sequela: Secondary | ICD-10-CM | POA: Diagnosis not present

## 2016-02-21 DIAGNOSIS — R7989 Other specified abnormal findings of blood chemistry: Secondary | ICD-10-CM | POA: Diagnosis not present

## 2016-02-21 DIAGNOSIS — Z23 Encounter for immunization: Secondary | ICD-10-CM | POA: Diagnosis not present

## 2016-02-21 LAB — CBC WITH DIFFERENTIAL/PLATELET
BASOS PCT: 0.4 % (ref 0.0–3.0)
Basophils Absolute: 0 10*3/uL (ref 0.0–0.1)
EOS PCT: 4.3 % (ref 0.0–5.0)
Eosinophils Absolute: 0.3 10*3/uL (ref 0.0–0.7)
HEMATOCRIT: 36 % — AB (ref 39.0–52.0)
Hemoglobin: 12 g/dL — ABNORMAL LOW (ref 13.0–17.0)
LYMPHS PCT: 16.3 % (ref 12.0–46.0)
Lymphs Abs: 1.2 10*3/uL (ref 0.7–4.0)
MCHC: 33.4 g/dL (ref 30.0–36.0)
MCV: 94.8 fl (ref 78.0–100.0)
MONOS PCT: 6 % (ref 3.0–12.0)
Monocytes Absolute: 0.4 10*3/uL (ref 0.1–1.0)
NEUTROS ABS: 5.2 10*3/uL (ref 1.4–7.7)
Neutrophils Relative %: 73 % (ref 43.0–77.0)
PLATELETS: 241 10*3/uL (ref 150.0–400.0)
RBC: 3.8 Mil/uL — ABNORMAL LOW (ref 4.22–5.81)
RDW: 13.5 % (ref 11.5–15.5)
WBC: 7.2 10*3/uL (ref 4.0–10.5)

## 2016-02-21 LAB — BASIC METABOLIC PANEL
BUN: 16 mg/dL (ref 6–23)
CALCIUM: 9.4 mg/dL (ref 8.4–10.5)
CO2: 29 mEq/L (ref 19–32)
CREATININE: 1.02 mg/dL (ref 0.40–1.50)
Chloride: 102 mEq/L (ref 96–112)
GFR: 74.13 mL/min (ref >60.00)
Glucose, Bld: 157 mg/dL — ABNORMAL HIGH (ref 70–99)
Potassium: 4.4 mEq/L (ref 3.5–5.1)
Sodium: 137 mEq/L (ref 135–145)

## 2016-02-24 DIAGNOSIS — M5136 Other intervertebral disc degeneration, lumbar region: Secondary | ICD-10-CM | POA: Diagnosis not present

## 2016-02-24 DIAGNOSIS — M9901 Segmental and somatic dysfunction of cervical region: Secondary | ICD-10-CM | POA: Diagnosis not present

## 2016-02-24 DIAGNOSIS — M62830 Muscle spasm of back: Secondary | ICD-10-CM | POA: Diagnosis not present

## 2016-02-24 DIAGNOSIS — M9903 Segmental and somatic dysfunction of lumbar region: Secondary | ICD-10-CM | POA: Diagnosis not present

## 2016-03-02 DIAGNOSIS — M5136 Other intervertebral disc degeneration, lumbar region: Secondary | ICD-10-CM | POA: Diagnosis not present

## 2016-03-02 DIAGNOSIS — M62830 Muscle spasm of back: Secondary | ICD-10-CM | POA: Diagnosis not present

## 2016-03-02 DIAGNOSIS — M9903 Segmental and somatic dysfunction of lumbar region: Secondary | ICD-10-CM | POA: Diagnosis not present

## 2016-03-02 DIAGNOSIS — M9901 Segmental and somatic dysfunction of cervical region: Secondary | ICD-10-CM | POA: Diagnosis not present

## 2016-03-09 DIAGNOSIS — M62830 Muscle spasm of back: Secondary | ICD-10-CM | POA: Diagnosis not present

## 2016-03-09 DIAGNOSIS — M5136 Other intervertebral disc degeneration, lumbar region: Secondary | ICD-10-CM | POA: Diagnosis not present

## 2016-03-09 DIAGNOSIS — M9903 Segmental and somatic dysfunction of lumbar region: Secondary | ICD-10-CM | POA: Diagnosis not present

## 2016-03-09 DIAGNOSIS — M9901 Segmental and somatic dysfunction of cervical region: Secondary | ICD-10-CM | POA: Diagnosis not present

## 2016-03-11 DIAGNOSIS — M1611 Unilateral primary osteoarthritis, right hip: Secondary | ICD-10-CM | POA: Diagnosis not present

## 2016-03-23 DIAGNOSIS — M9903 Segmental and somatic dysfunction of lumbar region: Secondary | ICD-10-CM | POA: Diagnosis not present

## 2016-03-23 DIAGNOSIS — M5136 Other intervertebral disc degeneration, lumbar region: Secondary | ICD-10-CM | POA: Diagnosis not present

## 2016-03-23 DIAGNOSIS — M62830 Muscle spasm of back: Secondary | ICD-10-CM | POA: Diagnosis not present

## 2016-03-23 DIAGNOSIS — M9901 Segmental and somatic dysfunction of cervical region: Secondary | ICD-10-CM | POA: Diagnosis not present

## 2016-04-06 DIAGNOSIS — M9903 Segmental and somatic dysfunction of lumbar region: Secondary | ICD-10-CM | POA: Diagnosis not present

## 2016-04-06 DIAGNOSIS — M9901 Segmental and somatic dysfunction of cervical region: Secondary | ICD-10-CM | POA: Diagnosis not present

## 2016-04-06 DIAGNOSIS — M62830 Muscle spasm of back: Secondary | ICD-10-CM | POA: Diagnosis not present

## 2016-04-06 DIAGNOSIS — M5136 Other intervertebral disc degeneration, lumbar region: Secondary | ICD-10-CM | POA: Diagnosis not present

## 2016-04-13 ENCOUNTER — Ambulatory Visit (INDEPENDENT_AMBULATORY_CARE_PROVIDER_SITE_OTHER): Payer: Medicare Other

## 2016-04-13 ENCOUNTER — Other Ambulatory Visit: Payer: Self-pay | Admitting: Family Medicine

## 2016-04-13 VITALS — BP 112/60 | HR 56 | Temp 98.1°F | Ht 68.0 in | Wt 216.5 lb

## 2016-04-13 DIAGNOSIS — Z0000 Encounter for general adult medical examination without abnormal findings: Secondary | ICD-10-CM

## 2016-04-13 MED ORDER — HYDROCOD POLST-CPM POLST ER 10-8 MG/5ML PO SUER: ORAL | Status: DC
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
## 2016-04-13 NOTE — Progress Notes (Signed)
rx request for tussionex. Printed.  If not better, then we can recheck here at [facility]  Thanks.

## 2016-04-13 NOTE — Patient Instructions (Addendum)
Christopher Santiago , Thank you for taking time to come for your Medicare Wellness Visit. I appreciate your ongoing commitment to your health goals. Please review the following plan we discussed and let me know if I can assist you in the future.   These are the goals we discussed: Goals    . Increase physical activity     Starting 04/13/2016, I will attempt to increase exercise to 3 days per week as tolerated.        This is a list of the screening recommended for you and due dates:  Health Maintenance  Topic Date Due  . Flu Shot  06/23/2016  . Tetanus Vaccine  03/15/2023  . Shingles Vaccine  Completed  . Pneumonia vaccines  Completed     Preventive Care for Adults  A healthy lifestyle and preventive care can promote health and wellness. Preventive health guidelines for adults include the following key practices.  . A routine yearly physical is a good way to check with your health care provider about your health and preventive screening. It is a chance to share any concerns and updates on your health and to receive a thorough exam.  . Visit your dentist for a routine exam and preventive care every 6 months. Brush your teeth twice a day and floss once a day. Good oral hygiene prevents tooth decay and gum disease.  . The frequency of eye exams is based on your age, health, family medical history, use  of contact lenses, and other factors. Follow your health care provider's ecommendations for frequency of eye exams.  . Eat a healthy diet. Foods like vegetables, fruits, whole grains, low-fat dairy products, and lean protein foods contain the nutrients you need without too many calories. Decrease your intake of foods high in solid fats, added sugars, and salt. Eat the right amount of calories for you. Get information about a proper diet from your health care provider, if necessary.  . Regular physical exercise is one of the most important things you can do for your health. Most adults should get  at least 150 minutes of moderate-intensity exercise (any activity that increases your heart rate and causes you to sweat) each week. In addition, most adults need muscle-strengthening exercises on 2 or more days a week.  Silver Sneakers may be a benefit available to you. To determine eligibility, you may visit the website: www.silversneakers.com or contact program at 414-740-2530 Mon-Fri between 8AM-8PM.   . Maintain a healthy weight. The body mass index (BMI) is a screening tool to identify possible weight problems. It provides an estimate of body fat based on height and weight. Your health care provider can find your BMI and can help you achieve or maintain a healthy weight.   For adults 20 years and older: ? A BMI below 18.5 is considered underweight. ? A BMI of 18.5 to 24.9 is normal. ? A BMI of 25 to 29.9 is considered overweight. ? A BMI of 30 and above is considered obese.   . Maintain normal blood lipids and cholesterol levels by exercising and minimizing your intake of saturated fat. Eat a balanced diet with plenty of fruit and vegetables. Blood tests for lipids and cholesterol should begin at age 70 and be repeated every 5 years. If your lipid or cholesterol levels are high, you are over 50, or you are at high risk for heart disease, you may need your cholesterol levels checked more frequently. Ongoing high lipid and cholesterol levels should be treated with  medicines if diet and exercise are not working.  . If you smoke, find out from your health care provider how to quit. If you do not use tobacco, please do not start.  . If you choose to drink alcohol, please do not consume more than 2 drinks per day. One drink is considered to be 12 ounces (355 mL) of beer, 5 ounces (148 mL) of wine, or 1.5 ounces (44 mL) of liquor.  . If you are 18-41 years old, ask your health care provider if you should take aspirin to prevent strokes.  . Use sunscreen. Apply sunscreen liberally and repeatedly  throughout the day. You should seek shade when your shadow is shorter than you. Protect yourself by wearing long sleeves, pants, a wide-brimmed hat, and sunglasses year round, whenever you are outdoors.  . Once a month, do a whole body skin exam, using a mirror to look at the skin on your back. Tell your health care provider of new moles, moles that have irregular borders, moles that are larger than a pencil eraser, or moles that have changed in shape or color.     Fall Prevention in the Home  Falls can cause injuries. They can happen to people of all ages. There are many things you can do to make your home safe and to help prevent falls.  WHAT CAN I DO ON THE OUTSIDE OF MY HOME?  Regularly fix the edges of walkways and driveways and fix any cracks.  Remove anything that might make you trip as you walk through a door, such as a raised step or threshold.  Trim any bushes or trees on the path to your home.  Use bright outdoor lighting.  Clear any walking paths of anything that might make someone trip, such as rocks or tools.  Regularly check to see if handrails are loose or broken. Make sure that both sides of any steps have handrails.  Any raised decks and porches should have guardrails on the edges.  Have any leaves, snow, or ice cleared regularly.  Use sand or salt on walking paths during winter.  Clean up any spills in your garage right away. This includes oil or grease spills. WHAT CAN I DO IN THE BATHROOM?   Use night lights.  Install grab bars by the toilet and in the tub and shower. Do not use towel bars as grab bars.  Use non-skid mats or decals in the tub or shower.  If you need to sit down in the shower, use a plastic, non-slip stool.  Keep the floor dry. Clean up any water that spills on the floor as soon as it happens.  Remove soap buildup in the tub or shower regularly.  Attach bath mats securely with double-sided non-slip rug tape.  Do not have throw rugs  and other things on the floor that can make you trip. WHAT CAN I DO IN THE BEDROOM?  Use night lights.  Make sure that you have a light by your bed that is easy to reach.  Do not use any sheets or blankets that are too big for your bed. They should not hang down onto the floor.  Have a firm chair that has side arms. You can use this for support while you get dressed.  Do not have throw rugs and other things on the floor that can make you trip. WHAT CAN I DO IN THE KITCHEN?  Clean up any spills right away.  Avoid walking on wet floors.  Keep items that you use a lot in easy-to-reach places.  If you need to reach something above you, use a strong step stool that has a grab bar.  Keep electrical cords out of the way.  Do not use floor polish or wax that makes floors slippery. If you must use wax, use non-skid floor wax.  Do not have throw rugs and other things on the floor that can make you trip. WHAT CAN I DO WITH MY STAIRS?  Do not leave any items on the stairs.  Make sure that there are handrails on both sides of the stairs and use them. Fix handrails that are broken or loose. Make sure that handrails are as long as the stairways.  Check any carpeting to make sure that it is firmly attached to the stairs. Fix any carpet that is loose or worn.  Avoid having throw rugs at the top or bottom of the stairs. If you do have throw rugs, attach them to the floor with carpet tape.  Make sure that you have a light switch at the top of the stairs and the bottom of the stairs. If you do not have them, ask someone to add them for you. WHAT ELSE CAN I DO TO HELP PREVENT FALLS?  Wear shoes that:  Do not have high heels.  Have rubber bottoms.  Are comfortable and fit you well.  Are closed at the toe. Do not wear sandals.  If you use a stepladder:  Make sure that it is fully opened. Do not climb a closed stepladder.  Make sure that both sides of the stepladder are locked into  place.  Ask someone to hold it for you, if possible.  Clearly mark and make sure that you can see:  Any grab bars or handrails.  First and last steps.  Where the edge of each step is.  Use tools that help you move around (mobility aids) if they are needed. These include:  Canes.  Walkers.  Scooters.  Crutches.  Turn on the lights when you go into a dark area. Replace any light bulbs as soon as they burn out.  Set up your furniture so you have a clear path. Avoid moving your furniture around.  If any of your floors are uneven, fix them.  If there are any pets around you, be aware of where they are.  Review your medicines with your doctor. Some medicines can make you feel dizzy. This can increase your chance of falling. Ask your doctor what other things that you can do to help prevent falls.   This information is not intended to replace advice given to you by your health care provider. Make sure you discuss any questions you have with your health care provider.   Document Released: 09/05/2009 Document Revised: 03/26/2015 Document Reviewed: 12/14/2014 Elsevier Interactive Patient Education [employer].

## 2016-04-13 NOTE — Progress Notes (Signed)
Patient advised.  Rx left at front desk for pick up. 

## 2016-04-13 NOTE — Progress Notes (Signed)
Pre visit review using our clinic review tool, if applicable. No additional management support is needed unless otherwise documented below in the visit note.  I reviewed health advisor's note, was available for consultation on the day of service listed in this note, and agree with documentation and plan. Elsie Stain, MD.

## 2016-04-13 NOTE — Progress Notes (Signed)
PCP notes:  Health maintenance:   No gaps identified or addressed.  Abnormal screenings:  Fall risk - hx of multiple falls with injury  Patient concerns:  Pt wishes to discontinue Spiriva. Pt states his co-pay is $400 for a 120 day supply. Pt feels due to cost, his age, and the lack of medical necessity, it would be beneficial for him to stop taking medication. Pt states he has a 30-40 day supply on hand and would like to discontinue any refills at that time.   Pt requested a prescription for Tussionex.   Nurse concerns: None

## 2016-04-15 ENCOUNTER — Ambulatory Visit (INDEPENDENT_AMBULATORY_CARE_PROVIDER_SITE_OTHER): Payer: Medicare Other | Admitting: Cardiovascular Disease

## 2016-04-15 ENCOUNTER — Encounter: Payer: Self-pay | Admitting: Cardiovascular Disease

## 2016-04-15 VITALS — BP 110/46 | HR 57 | Ht 68.0 in | Wt 219.8 lb

## 2016-04-15 DIAGNOSIS — Z7289 Other problems related to lifestyle: Secondary | ICD-10-CM

## 2016-04-15 DIAGNOSIS — E7800 Pure hypercholesterolemia, unspecified: Secondary | ICD-10-CM

## 2016-04-15 DIAGNOSIS — I10 Essential (primary) hypertension: Secondary | ICD-10-CM | POA: Diagnosis not present

## 2016-04-15 DIAGNOSIS — I2510 Atherosclerotic heart disease of native coronary artery without angina pectoris: Secondary | ICD-10-CM | POA: Diagnosis not present

## 2016-04-15 DIAGNOSIS — J42 Unspecified chronic bronchitis: Secondary | ICD-10-CM

## 2016-04-15 DIAGNOSIS — I739 Peripheral vascular disease, unspecified: Secondary | ICD-10-CM

## 2016-04-15 DIAGNOSIS — E785 Hyperlipidemia, unspecified: Secondary | ICD-10-CM

## 2016-04-15 DIAGNOSIS — F1090 Alcohol use, unspecified, uncomplicated: Secondary | ICD-10-CM

## 2016-04-15 DIAGNOSIS — Z789 Other specified health status: Secondary | ICD-10-CM

## 2016-04-15 NOTE — Patient Instructions (Addendum)
You are doing well.  Ok to stop metoprolol and spiriva  Please call us if you have new issues that need to be addressed before your next appt.  Your physician wants you to follow-up in: 6 months.  You will receive a reminder letter in the mail two months in advance. If you don't receive a letter, please call our office to schedule the follow-up appointment.

## 2016-04-15 NOTE — Assessment & Plan Note (Signed)
Cholesterol is at goal on the current lipid regimen. No changes to the medications were made.  

## 2016-04-15 NOTE — Assessment & Plan Note (Signed)
He is interested in stopping the Spiriva He does not feel that the expense is worth it Suggested it was up to him and he could hold the medication, monitor his symptoms and if symptoms get worse, would restart the medication

## 2016-04-15 NOTE — Assessment & Plan Note (Signed)
Known lower extremity arterial disease. Lower extremity pulses appreciated today

## 2016-04-15 NOTE — Assessment & Plan Note (Signed)
Long discussion concerning his beta blocker, benefits. Heart rate is low, blood pressure borderline low. Suggested he hold the beta blocker, see if his energy improves

## 2016-04-15 NOTE — Assessment & Plan Note (Signed)
Currently with no symptoms of angina. No further workup at this time. Continue current medication regimen. 

## 2016-04-15 NOTE — Assessment & Plan Note (Signed)
Alcohol intake was not discussed with him in detail. We will discuss with him in follow-up visit   Total encounter time more than 25 minutes  Greater than 50% was spent in counseling and coordination of care with the patient
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
## 2016-04-21 DIAGNOSIS — M5136 Other intervertebral disc degeneration, lumbar region: Secondary | ICD-10-CM | POA: Diagnosis not present

## 2016-04-21 DIAGNOSIS — M62830 Muscle spasm of back: Secondary | ICD-10-CM | POA: Diagnosis not present

## 2016-04-21 DIAGNOSIS — M9903 Segmental and somatic dysfunction of lumbar region: Secondary | ICD-10-CM | POA: Diagnosis not present

## 2016-04-21 DIAGNOSIS — M9901 Segmental and somatic dysfunction of cervical region: Secondary | ICD-10-CM | POA: Diagnosis not present

## 2016-05-04 DIAGNOSIS — M9903 Segmental and somatic dysfunction of lumbar region: Secondary | ICD-10-CM | POA: Diagnosis not present

## 2016-05-04 DIAGNOSIS — M62830 Muscle spasm of back: Secondary | ICD-10-CM | POA: Diagnosis not present

## 2016-05-04 DIAGNOSIS — M5136 Other intervertebral disc degeneration, lumbar region: Secondary | ICD-10-CM | POA: Diagnosis not present

## 2016-05-04 DIAGNOSIS — M9901 Segmental and somatic dysfunction of cervical region: Secondary | ICD-10-CM | POA: Diagnosis not present

## 2016-05-28 ENCOUNTER — Other Ambulatory Visit: Payer: Self-pay | Admitting: Family Medicine

## 2016-05-28 NOTE — Telephone Encounter (Signed)
Electronic refill request. Last Filled:     150 mL 0 04/13/2016  Please advise.

## 2016-05-29 MED ORDER — HYDROCOD POLST-CPM POLST ER 10-8 MG/5ML PO SUER: ORAL | Status: DC

## 2016-05-29 NOTE — Telephone Encounter (Signed)
Printed.  Scheduling note pasted below.   Please make sure patient is okay to await the scheduled OV.  Thanks.    Patient Comments:    Office Visit   I continue to have night time coughing. I visited Dr    Lucia Gaskins on 2 occasions and he didn't find a problem.   He prescribed Tussionex. I would like to renew my    prescription for something that will cure this cough.   I also have breathing problems.   Christopher Santiago

## 2016-05-29 NOTE — Telephone Encounter (Signed)
Patient advised.  Rx left at front desk for pick up. 

## 2016-06-01 ENCOUNTER — Encounter: Payer: Self-pay | Admitting: Family Medicine

## 2016-06-01 ENCOUNTER — Ambulatory Visit (INDEPENDENT_AMBULATORY_CARE_PROVIDER_SITE_OTHER): Payer: Medicare Other | Admitting: Family Medicine

## 2016-06-01 VITALS — BP 132/56 | HR 65 | Temp 98.5°F | Wt 218.0 lb

## 2016-06-01 DIAGNOSIS — I2510 Atherosclerotic heart disease of native coronary artery without angina pectoris: Secondary | ICD-10-CM

## 2016-06-01 DIAGNOSIS — R059 Cough, unspecified: Secondary | ICD-10-CM

## 2016-06-01 DIAGNOSIS — J42 Unspecified chronic bronchitis: Secondary | ICD-10-CM

## 2016-06-01 DIAGNOSIS — R05 Cough: Secondary | ICD-10-CM

## 2016-06-01 MED ORDER — HYDROCOD POLST-CPM POLST ER 10-8 MG/5ML PO SUER: ORAL | Status: DC

## 2016-06-01 MED ORDER — BUDESONIDE-FORMOTEROL FUMARATE 160-4.5 MCG/ACT IN AERO: 2.0000 | INHALATION_SPRAY | Freq: Two times a day (BID) | RESPIRATORY_TRACT | Status: DC

## 2016-06-01 NOTE — Progress Notes (Signed)
Pre visit review using our clinic review tool, if applicable. No additional management support is needed unless otherwise documented below in the visit note.  Cough. Prev hx. Cough with post nasal gtt. "Constant drainage of mucous." Long standing. Sx worse laying on his side, better if laying on his back. Throat irritation, "that causes me to cough." "It feels like my throat is dry." No new meds. Still on SABA. Had used hydrocodone cough syrup and also coricidin at night. hydrocodone would help some, but not always, coricidin didn't help. No sputum usually but occ he'll get something up. No fevers. Frequent tickle in the throat. No wheeze usually.  Still on nasal steroids w/o much relief. Had seen ENT about 2 years ago. He "graduated" from ENT f/u.  Prev plan was try to nasal atrovent.    H/o vocal cord nodule in the distant past.  He has seen Dr. Lucia Gaskins in the last few months with unremarkable vocal cord eval per patient report.  He still has clear liquid running out of his nose when he leans forward in the AM.  Still with a raw feeling in his throat.  Out of all interventions, hydrocodone was the only thing that helped a lot.  Nasal atrovent didn't help much, so he stopped it.  Other nasal sprays didn't help much at all.   He has fallen mult times.  D/w pt about cautions.  D/w pt about balance exercises.  He is s/p PT after prev fall, w/o clear benefit per patient.    SOB.  He is only using SABA, no other inhalers, but using SABA daily.  He'll get SOB with walking any distance.  SABA helps some.  No CP.  spiriva didn't help at all.  Off symbicort recently.    He is caring for his wife and that is a strain for him.    Meds, vitals, and allergies reviewed.   ROS: Per HPI unless specifically indicated in ROS section   GEN: nad, alert and oriented HEENT: mucous membranes moist, nasal exam stuffy, clear rhinorrhea noted NECK: supple w/o LA CV: rrr PULM: ctab, no inc wob, no wheeze  noted.  ABD: soft, +bs EXT: no edema SKIN: no acute rash

## 2016-06-01 NOTE — Patient Instructions (Addendum)
I think the breathing issues and the cough are separate problems.  Restart symbicort to see if that helps your breathing.  You should need the albuterol less.   Look up [email or web address] to check on getting a coupon.  Keep using the cough syrup in the meantime.   Take care.  Glad to see you.

## 2016-06-02 NOTE — Assessment & Plan Note (Signed)
SOB worse now, using SABA more often than goal.  D/w pt.  No change in sx with spiriva per patient.  Restart symbicort, d/w pt about how to get a coupon and he'll update me as needed.  Okay for outpatient f/u.  This appears to be a chronic issue, not acute SOB, and separate from the cough.  He agrees.  >25 minutes spent in face to face time with patient, >50% spent in counselling or coordination of care.

## 2016-06-02 NOTE — Assessment & Plan Note (Signed)
Hydrocodone cough syrup at night was the only intervention that had helped.  D/w pt about cautions.  Has had ENT f/u in the meantime.  The cough appears to be different for SOB, he agrees.  rx'd hydrocodone with cautions and he'll update me as needed.

## 2016-06-04 ENCOUNTER — Other Ambulatory Visit: Payer: Self-pay

## 2016-06-04 ENCOUNTER — Encounter: Payer: Medicare Other | Admitting: Internal Medicine

## 2016-06-04 NOTE — Progress Notes (Signed)
[facility] Consultation      MRN# [id number] Christopher Santiago 1931-01-07   CC: No chief complaint on file.     Brief History: Mr. Keysor first saw the [facility] in 05/2013 for COPD and cough. He had a lot of post nasal drip. PFT's showed moderate obstruction in 04/2013 (Ratio 47%, FEV1 1.73L, 54% predicted). OSA on CPAP   Events since last clinic visit: Presents today for follow-up visit of OSA. Review of chart shows that he had a sleep titration study done in September 2015 with optimal CPAP pressure of 10 cm of water. Still with complaints of postnasal drip, and recently saw his primary care physician, see below.   Review of Chart by Dr. Stevenson Clinch OV with Dr. Damita Dunnings 06/01/16 Cough -Hydrocodone cough syrup at night was the only intervention that had helped. D/w pt about cautions. Has had ENT f/u in the meantime. The cough appears to be different for SOB, he agrees. rx'd hydrocodone with cautions and he'll update me as needed.  COPD -SOB worse now, using SABA more often than goal. D/w pt. No change in sx with spiriva per patient. Restart symbicort, d/w pt about how to get a coupon and he'll update me as needed. Okay for outpatient f/u. This appears to be a chronic issue, not acute SOB, and separate from the cough. He agrees.  Visit Details: Cough. Prev hx. Cough with post nasal gtt. "Constant drainage of mucous." Long standing. Sx worse laying on his side, better if laying on his back. Throat irritation, "that causes me to cough." "It feels like my throat is dry." No new meds. Still on SABA. Had used hydrocodone cough syrup and also coricidin at night. hydrocodone would help some, but not always, coricidin didn't help. No sputum usually but occ he'll get something up. No fevers. Frequent tickle in the throat. No wheeze usually.  Still on nasal steroids w/o much relief. Had seen ENT about 2 years ago. He "graduated" from  ENT f/u. Prev plan was try to nasal atrovent.   H/o vocal cord nodule in the distant past. He has seen Dr. Lucia Gaskins in the last few months with unremarkable vocal cord eval per patient report. He still has clear liquid running out of his nose when he leans forward in the AM. Still with a raw feeling in his throat. Out of all interventions, hydrocodone was the only thing that helped a lot. Nasal atrovent didn't help much, so he stopped it. Other nasal sprays didn't help much at all.   He has fallen mult times. D/w pt about cautions. D/w pt about balance exercises. He is s/p PT after prev fall, w/o clear benefit per patient.   SOB. He is only using SABA, no other inhalers, but using SABA daily. He'll get SOB with walking any distance. SABA helps some. No CP. spiriva didn't help at all. Off symbicort recently.    Medication:   Current Outpatient Rx  Name  Route  Sig  Dispense  Refill  . albuterol (PROVENTIL HFA;VENTOLIN HFA) 108 (90 BASE) MCG/ACT inhaler   Inhalation   Inhale 2 puffs into the lungs every 6 (six) hours as needed for wheezing or shortness of breath.   18 g   12   . allopurinol (ZYLOPRIM) 100 MG tablet   Oral   Take 1 tablet (100 mg total) by mouth daily.   120 tablet   2   . aspirin 81 MG EC tablet   Oral  Take 81 mg by mouth daily.           . budesonide-formoterol (SYMBICORT) 160-4.5 MCG/ACT inhaler   Inhalation   Inhale 2 puffs into the lungs 2 (two) times daily. Rinse after use.   1 Inhaler   3   . chlorpheniramine-HYDROcodone (TUSSIONEX) 10-8 MG/5ML SUER      TAKE 5 ML (1 TEASPOONFUL) BY MOUTH AT BEDTIME AS NEEDED   150 mL   0   . chlorpheniramine-HYDROcodone (TUSSIONEX) 10-8 MG/5ML SUER      TAKE 5 ML (1 TEASPOONFUL) BY MOUTH AT BEDTIME AS NEEDED   150 mL   0   . Cholecalciferol (VITAMIN D3) 1000 UNITS tablet   Oral   Take 2,000 Units by mouth daily.          . citalopram (CELEXA) 40 MG tablet   Oral   Take 1.5 tablets (60  mg total) by mouth daily.         Marland Kitchen losartan (COZAAR) 25 MG tablet      TAKE ONE TABLET BY MOUTH EVERY DAY   30 tablet   3   . simvastatin (ZOCOR) 20 MG tablet   Oral   Take 1 tablet (20 mg total) by mouth at bedtime.   120 tablet   3      ROS    Allergies:  Tessalon  Physical Examination:  VS: There were no vitals taken for this visit.  General Appearance: No distress  HEENT: PERRLA, no ptosis, no other lesions noticed Pulmonary:normal breath sounds., diaphragmatic excursion normal.No wheezing, No rales   Cardiovascular:  Normal S1,S2.  No m/r/g.     Abdomen:Exam: Benign, Soft, non-tender, No masses  Skin:   warm, no rashes, no ecchymosis  Extremities: normal, no cyanosis, clubbing, warm with normal capillary refill.      Rad results: (The following images and results were reviewed by Dr. Stevenson Clinch).     Assessment and Plan: No problem-specific assessment & plan notes found for this encounter.   Updated Medication List Outpatient Encounter Prescriptions as of 06/04/2016  Medication Sig  . albuterol (PROVENTIL HFA;VENTOLIN HFA) 108 (90 BASE) MCG/ACT inhaler Inhale 2 puffs into the lungs every 6 (six) hours as needed for wheezing or shortness of breath.  . allopurinol (ZYLOPRIM) 100 MG tablet Take 1 tablet (100 mg total) by mouth daily.  Marland Kitchen aspirin 81 MG EC tablet Take 81 mg by mouth daily.    . budesonide-formoterol (SYMBICORT) 160-4.5 MCG/ACT inhaler Inhale 2 puffs into the lungs 2 (two) times daily. Rinse after use.  . chlorpheniramine-HYDROcodone (TUSSIONEX) 10-8 MG/5ML SUER TAKE 5 ML (1 TEASPOONFUL) BY MOUTH AT BEDTIME AS NEEDED  . chlorpheniramine-HYDROcodone (TUSSIONEX) 10-8 MG/5ML SUER TAKE 5 ML (1 TEASPOONFUL) BY MOUTH AT BEDTIME AS NEEDED  . Cholecalciferol (VITAMIN D3) 1000 UNITS tablet Take 2,000 Units by mouth daily.   . citalopram (CELEXA) 40 MG tablet Take 1.5 tablets (60 mg total) by mouth daily.  Marland Kitchen losartan (COZAAR) 25 MG tablet TAKE ONE TABLET BY  MOUTH EVERY DAY  . simvastatin (ZOCOR) 20 MG tablet Take 1 tablet (20 mg total) by mouth at bedtime.   No facility-administered encounter medications on file as of 06/04/2016.    Orders for this visit: No orders of the defined types were placed in this encounter.    Thank  you for the visitation and for allowing  [facility] to assist in the care of your patient. Our recommendations are noted above.  Please contact us  if we can be of further service.  Vilinda Boehringer, MD [facility] Office Number: [phone]  Note: This note was prepared with Dragon dictation along with smaller phrase technology. Any transcriptional errors that result from this process are unintentional.

## 2016-06-08 ENCOUNTER — Other Ambulatory Visit: Payer: Self-pay | Admitting: Family Medicine

## 2016-06-08 ENCOUNTER — Encounter: Payer: Self-pay | Admitting: Internal Medicine

## 2016-06-08 ENCOUNTER — Ambulatory Visit (INDEPENDENT_AMBULATORY_CARE_PROVIDER_SITE_OTHER): Payer: Medicare Other | Admitting: Internal Medicine

## 2016-06-08 VITALS — BP 122/64 | HR 61 | Ht 69.0 in | Wt 217.0 lb

## 2016-06-08 DIAGNOSIS — R05 Cough: Secondary | ICD-10-CM

## 2016-06-08 DIAGNOSIS — I2510 Atherosclerotic heart disease of native coronary artery without angina pectoris: Secondary | ICD-10-CM

## 2016-06-08 DIAGNOSIS — G4733 Obstructive sleep apnea (adult) (pediatric): Secondary | ICD-10-CM

## 2016-06-08 DIAGNOSIS — R053 Chronic cough: Secondary | ICD-10-CM

## 2016-06-08 DIAGNOSIS — J418 Mixed simple and mucopurulent chronic bronchitis: Secondary | ICD-10-CM | POA: Diagnosis not present

## 2016-06-08 DIAGNOSIS — Z9989 Dependence on other enabling machines and devices: Principal | ICD-10-CM

## 2016-06-08 NOTE — Patient Instructions (Addendum)
Follow up with Dr. Stevenson Clinch in:6 weeks - CPAP 10 cm  H2O - we will give an order for the supplies also.  - CXR prior to follow up visit

## 2016-06-08 NOTE — Progress Notes (Addendum)
[facility] Consultation      MRN# [id number] [name] 27-Apr-1943   CC: Chief Complaint  Patient presents with  . Follow-up    seen BQ last 04/2014      Brief History: Christopher Santiago first saw the [facility] in 05/2013 for COPD and cough. He had a lot of post nasal drip. PFT's showed moderate obstruction in 04/2013 (Ratio 47%, FEV1 1.73L, 54% predicted), noted to have a sleep study, on CPAP of 10   Events since last clinic visit: Patient presents today for follow-up visit, he has a history of COPD, obstructive sleep apnea. Patient states that about 2 weeks ago his CPAP machine stopped working completely. He has a diagnosis of OSA for the past 10 years, has been on CPAP per our records since at least 2015, pressure of 10 cm of water, using his cpap machine regular with good compliance and benefiting clinically.  Review of chart shows a recently followed up with his primary care physician for cough. At that visit he was noted to only be using his rescue inhaler and no maintenance inhaler. At that visit he was given hydrocodone cough syrup, follow-up with ENT, also he was advised too restart his Symbicort. Today patient states that he still has a cough that is improving, he does have follow-up with ENT. His cough is productive at times. He also stated that during the time he's had his CPAP machine he has not changed the chamber or the hose or the mask, he states he was unaware he was supposed to do that. He also has completed 12 weeks of pulmonary rehabilitation.     Medication:   Current Outpatient Rx  Name  Route  Sig  Dispense  Refill  . albuterol (PROVENTIL HFA;VENTOLIN HFA) 108 (90 BASE) MCG/ACT inhaler   Inhalation   Inhale 2 puffs into the lungs every 6 (six) hours as needed for wheezing or shortness of breath.   18 g   12   . allopurinol (ZYLOPRIM) 100 MG tablet   Oral   Take 1 tablet (100 mg total) by mouth daily.   120 tablet  2   . aspirin 81 MG EC tablet   Oral   Take 81 mg by mouth daily.           . budesonide-formoterol (SYMBICORT) 160-4.5 MCG/ACT inhaler   Inhalation   Inhale 2 puffs into the lungs 2 (two) times daily. Rinse after use.   1 Inhaler   3   . chlorpheniramine-HYDROcodone (TUSSIONEX) 10-8 MG/5ML SUER      TAKE 5 ML (1 TEASPOONFUL) BY MOUTH AT BEDTIME AS NEEDED   150 mL   0   . Cholecalciferol (VITAMIN D3) 1000 UNITS tablet   Oral   Take 2,000 Units by mouth daily.          . citalopram (CELEXA) 40 MG tablet   Oral   Take 1.5 tablets (60 mg total) by mouth daily.         Marland Kitchen losartan (COZAAR) 25 MG tablet      TAKE ONE TABLET BY MOUTH EVERY DAY   30 tablet   3   . simvastatin (ZOCOR) 20 MG tablet   Oral   Take 1 tablet (20 mg total) by mouth at bedtime.   120 tablet   3      Review of Systems  Constitutional: Positive for malaise/fatigue. Negative for fever and chills.  HENT: Negative for hearing loss.  Eyes: Negative for blurred vision.  Respiratory: Positive for cough, sputum production and shortness of breath. Negative for wheezing.   Cardiovascular: Negative for chest pain.  Gastrointestinal: Negative for heartburn and nausea.  Musculoskeletal: Negative for myalgias.  Skin: Negative for itching and rash.  Neurological: Positive for weakness. Negative for dizziness and headaches.  Endo/Heme/Allergies: Does not bruise/bleed easily.  Psychiatric/Behavioral: Negative for depression.      Allergies:  Tessalon  Physical Examination:  VS: BP 122/64 mmHg  Pulse 61  Ht '5\' 9"'$  (1.753 m)  Wt 217 lb (98.431 kg)  BMI 32.03 kg/m2  SpO2 96%  General Appearance: No distress  HEENT: PERRLA, no ptosis, no other lesions noticed Pulmonary: Coarse upper airway sounds, diaphragmatic excursion normal.No wheezing, No rales   Cardiovascular:  Normal S1,S2.  No m/r/g.     Abdomen:Exam: Benign, Soft, non-tender, No masses  Skin:   warm, no rashes, no ecchymosis    Extremities: normal, no cyanosis, clubbing, warm with normal capillary refill.      Rad results: (The following images and results were reviewed by Dr. Stevenson Clinch on 06/08/16). CXR 02/07/16 EXAM: CHEST 1 VIEW  COMPARISON: 01/09/2015  FINDINGS: There is linear left base opacity due to scarring or atelectasis. The right lung is clear except for stable upper lobe nodule. Hilar and mediastinal contours are unremarkable and unchanged. Pulmonary vasculature is normal.  IMPRESSION: Linear scarring or atelectasis in the left base.    Assessment and Plan: 82 year old male with a past medical history of COPD, chronic cough, OSA, presents for follow-up of OSA and COPD. OSA on CPAP Patient with a known diagnosis of obstructive sleep apnea, requiring 10 cm of water, with good compliance and benefiting clinically.  His machine is currently stopped functioning completely. Patient educated on the use of CPAP, it's indications, and benefits. I also educated the patient on appropriate changing of his CPAP supplies including the chamber, hose, and mask on a regular basis.  Plan: -Continue with CPAP 10 cm of water -Orders for new CPAP machine, and supplies along with further evaluation by DME.  COPD (chronic obstructive pulmonary disease) ([place]) Unfortunately it sounds like Makar's COPD got out of control this winter when he stopped taking his Symbicort for some reason. His PMD has restarted his symbicort.  He's currently not on Spiriva.   I have educated the patient about the use of his inhalers including maintenance and rescue inhaler. I agree with his PMD to continue with Symbicort.  Plan: -Continue Symbicort 160/4.5    Chronic cough Amon's biggest complaint to me today is his cough. He attributes this to "a constant flow" from his nose down into his throat. He says that nothing is really ever made this better.  While the COPD with some component of chronic bronchitis could be  contributing to his cough, I think that the most likely etiology is the postnasal drip.  We'll restart his Symbicort, his PMD has already arranged ENT follow up. If he still continues to have symptoms, then we can consider further optimization of his COPD with spiriva.  However, in the past this did not add much clinical benefit, as again the majority of his symptoms stem from post nasal drip.   Plan: -ENT follow -restart symbicort -restart CPAP       Updated Medication List Outpatient Encounter Prescriptions as of 06/08/2016  Medication Sig  . albuterol (PROVENTIL HFA;VENTOLIN HFA) 108 (90 BASE) MCG/ACT inhaler Inhale 2 puffs into the lungs every 6 (six) hours as needed for wheezing  or shortness of breath.  . allopurinol (ZYLOPRIM) 100 MG tablet Take 1 tablet (100 mg total) by mouth daily.  Marland Kitchen aspirin 81 MG EC tablet Take 81 mg by mouth daily.    . budesonide-formoterol (SYMBICORT) 160-4.5 MCG/ACT inhaler Inhale 2 puffs into the lungs 2 (two) times daily. Rinse after use.  . chlorpheniramine-HYDROcodone (TUSSIONEX) 10-8 MG/5ML SUER TAKE 5 ML (1 TEASPOONFUL) BY MOUTH AT BEDTIME AS NEEDED  . Cholecalciferol (VITAMIN D3) 1000 UNITS tablet Take 2,000 Units by mouth daily.   . citalopram (CELEXA) 40 MG tablet Take 1.5 tablets (60 mg total) by mouth daily.  Marland Kitchen losartan (COZAAR) 25 MG tablet TAKE ONE TABLET BY MOUTH EVERY DAY  . simvastatin (ZOCOR) 20 MG tablet Take 1 tablet (20 mg total) by mouth at bedtime.  . [DISCONTINUED] chlorpheniramine-HYDROcodone (TUSSIONEX) 10-8 MG/5ML SUER TAKE 5 ML (1 TEASPOONFUL) BY MOUTH AT BEDTIME AS NEEDED (Patient not taking: Reported on 06/08/2016)   No facility-administered encounter medications on file as of 06/08/2016.    Orders for this visit: No orders of the defined types were placed in this encounter.    Thank  you for the visitation and for allowing  [facility] to assist in the care of your patient. Our recommendations are  noted above.  Please contact us if we can be of further service.  Vilinda Boehringer, MD [facility] Office Number: [phone]  Note: This note was prepared with Dragon dictation along with smaller phrase technology. Any transcriptional errors that result from this process are unintentional.

## 2016-06-09 NOTE — Assessment & Plan Note (Addendum)
Patient with a known diagnosis of obstructive sleep apnea, requiring 10 cm of water, with good compliance and benefiting clinically.  His machine is currently stopped functioning completely. Patient educated on the use of CPAP, it's indications, and benefits. I also educated the patient on appropriate changing of his CPAP supplies including the chamber, hose, and mask on a regular basis.  Plan: -Continue with CPAP 10 cm of water -Orders for new CPAP machine, and supplies along with further evaluation by DME.

## 2016-06-09 NOTE — Assessment & Plan Note (Signed)
Unfortunately it sounds like Christopher Santiago's COPD got out of control this winter when he stopped taking his Symbicort for some reason. His PMD has restarted his symbicort.  He's currently not on Spiriva.   I have educated the patient about the use of his inhalers including maintenance and rescue inhaler. I agree with his PMD to continue with Symbicort.  Plan: -Continue Symbicort 160/4.5

## 2016-06-09 NOTE — Assessment & Plan Note (Signed)
Christopher Santiago's biggest complaint to me today is his cough. He attributes this to "a constant flow" from his nose down into his throat. He says that nothing is really ever made this better.  While the COPD with some component of chronic bronchitis could be contributing to his cough, I think that the most likely etiology is the postnasal drip.  We'll restart his Symbicort, his PMD has already arranged ENT follow up. If he still continues to have symptoms, then we can consider further optimization of his COPD with spiriva.  However, in the past this did not add much clinical benefit, as again the majority of his symptoms stem from post nasal drip.   Plan: -ENT follow -restart symbicort -restart CPAP

## 2016-06-22 DIAGNOSIS — M9901 Segmental and somatic dysfunction of cervical region: Secondary | ICD-10-CM | POA: Diagnosis not present

## 2016-06-22 DIAGNOSIS — M9903 Segmental and somatic dysfunction of lumbar region: Secondary | ICD-10-CM | POA: Diagnosis not present

## 2016-06-22 DIAGNOSIS — M62830 Muscle spasm of back: Secondary | ICD-10-CM | POA: Diagnosis not present

## 2016-06-22 DIAGNOSIS — M5136 Other intervertebral disc degeneration, lumbar region: Secondary | ICD-10-CM | POA: Diagnosis not present

## 2016-06-27 ENCOUNTER — Emergency Department
Admission: EM | Admit: 2016-06-27 | Discharge: 2016-06-27 | Disposition: A | Discharge status: Home / Self Care | Payer: Medicare Other | Attending: Emergency Medicine | Admitting: Emergency Medicine

## 2016-06-27 ENCOUNTER — Emergency Department: Payer: Medicare Other

## 2016-06-27 ENCOUNTER — Encounter: Payer: Self-pay | Admitting: Emergency Medicine

## 2016-06-27 DIAGNOSIS — W28XXXA Contact with powered lawn mower, initial encounter: Secondary | ICD-10-CM | POA: Diagnosis not present

## 2016-06-27 DIAGNOSIS — Z87891 Personal history of nicotine dependence: Secondary | ICD-10-CM | POA: Diagnosis not present

## 2016-06-27 DIAGNOSIS — I2510 Atherosclerotic heart disease of native coronary artery without angina pectoris: Secondary | ICD-10-CM | POA: Diagnosis not present

## 2016-06-27 DIAGNOSIS — I10 Essential (primary) hypertension: Secondary | ICD-10-CM | POA: Insufficient documentation

## 2016-06-27 DIAGNOSIS — Z7982 Long term (current) use of aspirin: Secondary | ICD-10-CM | POA: Insufficient documentation

## 2016-06-27 DIAGNOSIS — Y929 Unspecified place or not applicable: Secondary | ICD-10-CM | POA: Insufficient documentation

## 2016-06-27 DIAGNOSIS — S68422A Partial traumatic amputation of left hand at wrist level, initial encounter: Secondary | ICD-10-CM | POA: Insufficient documentation

## 2016-06-27 DIAGNOSIS — Y939 Activity, unspecified: Secondary | ICD-10-CM | POA: Diagnosis not present

## 2016-06-27 DIAGNOSIS — J449 Chronic obstructive pulmonary disease, unspecified: Secondary | ICD-10-CM | POA: Insufficient documentation

## 2016-06-27 DIAGNOSIS — S61217A Laceration without foreign body of left little finger without damage to nail, initial encounter: Secondary | ICD-10-CM | POA: Diagnosis not present

## 2016-06-27 DIAGNOSIS — S68123A Partial traumatic metacarpophalangeal amputation of left middle finger, initial encounter: Secondary | ICD-10-CM | POA: Diagnosis not present

## 2016-06-27 DIAGNOSIS — E119 Type 2 diabetes mellitus without complications: Secondary | ICD-10-CM | POA: Insufficient documentation

## 2016-06-27 DIAGNOSIS — Y999 Unspecified external cause status: Secondary | ICD-10-CM | POA: Diagnosis not present

## 2016-06-27 DIAGNOSIS — S68115A Complete traumatic metacarpophalangeal amputation of left ring finger, initial encounter: Secondary | ICD-10-CM | POA: Diagnosis not present

## 2016-06-27 DIAGNOSIS — S68125A Partial traumatic metacarpophalangeal amputation of left ring finger, initial encounter: Secondary | ICD-10-CM | POA: Diagnosis not present

## 2016-06-27 DIAGNOSIS — Z7951 Long term (current) use of inhaled steroids: Secondary | ICD-10-CM | POA: Diagnosis not present

## 2016-06-27 DIAGNOSIS — S68113A Complete traumatic metacarpophalangeal amputation of left middle finger, initial encounter: Secondary | ICD-10-CM | POA: Diagnosis not present

## 2016-06-27 DIAGNOSIS — S68119A Complete traumatic metacarpophalangeal amputation of unspecified finger, initial encounter: Secondary | ICD-10-CM

## 2016-06-27 DIAGNOSIS — S61317A Laceration without foreign body of left little finger with damage to nail, initial encounter: Secondary | ICD-10-CM | POA: Diagnosis present

## 2016-06-27 HISTORY — DX: Complete traumatic metacarpophalangeal amputation of unspecified finger, initial encounter: S68.119A

## 2016-06-27 MED ORDER — CEPHALEXIN 500 MG PO CAPS: 500.0000 mg | ORAL_CAPSULE | Freq: Four times a day (QID) | ORAL | 0 refills | Status: DC

## 2016-06-27 MED ORDER — OXYCODONE HCL 5 MG PO TABS: 5.0000 mg | ORAL_TABLET | ORAL | 0 refills | Status: DC | PRN

## 2016-06-27 MED ORDER — LIDOCAINE HCL (PF) 1 % IJ SOLN
INTRAMUSCULAR | Status: AC
  Filled 2016-06-27: qty 5

## 2016-06-27 MED ORDER — TRAMADOL HCL 50 MG PO TABS
100.0000 mg | ORAL_TABLET | Freq: Once | ORAL | Status: AC
  Administered 2016-06-27: 100 mg via ORAL
  Filled 2016-06-27: qty 2

## 2016-06-27 MED ORDER — PIPERACILLIN-TAZOBACTAM 3.375 G IVPB 30 MIN
3.3750 g | Freq: Once | INTRAVENOUS | Status: AC
  Administered 2016-06-27: 3.375 g via INTRAVENOUS
  Filled 2016-06-27: qty 50

## 2016-06-27 MED ORDER — LIDOCAINE-EPINEPHRINE (PF) 1 %-1:200000 IJ SOLN
30.0000 mL | Freq: Once | INTRAMUSCULAR | Status: AC
  Administered 2016-06-27: 30 mL via INTRADERMAL
  Filled 2016-06-27: qty 30

## 2016-06-27 MED ORDER — BUPIVACAINE HCL (PF) 0.5 % IJ SOLN
INTRAMUSCULAR | Status: AC
  Filled 2016-06-27: qty 30

## 2016-06-27 NOTE — ED Notes (Signed)
Pt. Cut off the tips of the 3rd and 4th finger on the left hand. Bleeding controlled, pulses equal bilaterally, cap refill less than 3 seconds, denies loss of sensation, color appropriate.

## 2016-06-27 NOTE — Discharge Instructions (Addendum)
Keep dressings dry and intact. Keep hand elevated above heart level. Apply ice to affected area frequently. Take extra strength Tylenol and/or ibuprofen as needed for pain. May take oxycodone for more severe pain. Please take Keflex 500 mg 4 times a day for 10 days as prescribed. Return for follow-up in 3-5 days. Call [phone] on Monday to schedule this appointment.

## 2016-06-27 NOTE — ED Triage Notes (Addendum)
States he lifted up a lawnmower  Laceration to left 3 rd and 4 th fingers  Small amts missing from tips of fingers
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
## 2016-07-02 DIAGNOSIS — S68119A Complete traumatic metacarpophalangeal amputation of unspecified finger, initial encounter: Secondary | ICD-10-CM | POA: Insufficient documentation

## 2016-07-03 DIAGNOSIS — S68123A Partial traumatic metacarpophalangeal amputation of left middle finger, initial encounter: Secondary | ICD-10-CM | POA: Diagnosis not present

## 2016-07-03 DIAGNOSIS — S68125A Partial traumatic metacarpophalangeal amputation of left ring finger, initial encounter: Secondary | ICD-10-CM | POA: Diagnosis not present

## 2016-07-06 DIAGNOSIS — S68123A Partial traumatic metacarpophalangeal amputation of left middle finger, initial encounter: Secondary | ICD-10-CM | POA: Diagnosis not present

## 2016-07-06 DIAGNOSIS — S68125A Partial traumatic metacarpophalangeal amputation of left ring finger, initial encounter: Secondary | ICD-10-CM | POA: Diagnosis not present

## 2016-07-09 IMAGING — PT NM PET TUM IMG SKULL BASE T - THIGH
10 series · 25 of 25 positions shown · non-contrast
Comparison: Chest CT 11/04/2014.

CLINICAL DATA: Initial treatment strategy for lung cancer.

EXAM:
NUCLEAR MEDICINE PET SKULL BASE TO THIGH
TECHNIQUE: 12.33 mCi F-18 FDG was injected intravenously. Full-ring PET imaging
was performed from the skull base to thigh after the radiotracer. CT
data was obtained and used for attenuation correction and anatomic
localization.
FASTING BLOOD GLUCOSE:  Value: 115 mg/dl

[Series 3: ct wb 5.0 b30f · axial · 5.0mm · 0.98mm/px · z∈[-1490,-506]mm · 3 of 329 slices shown]
[im 1/329]
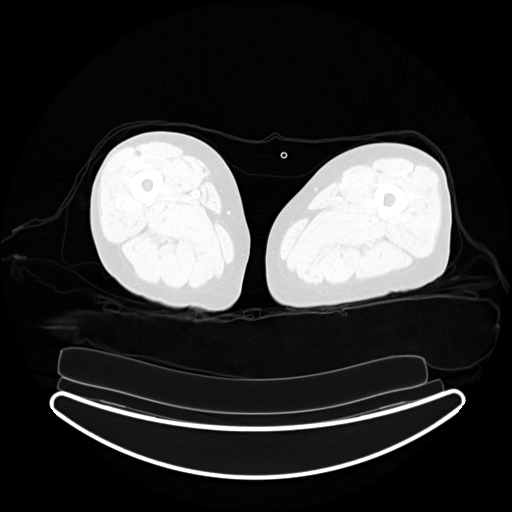
[im 165/329]
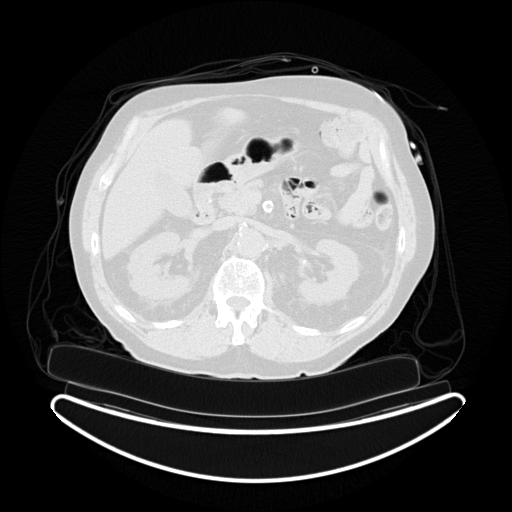
[im 329/329  brain]
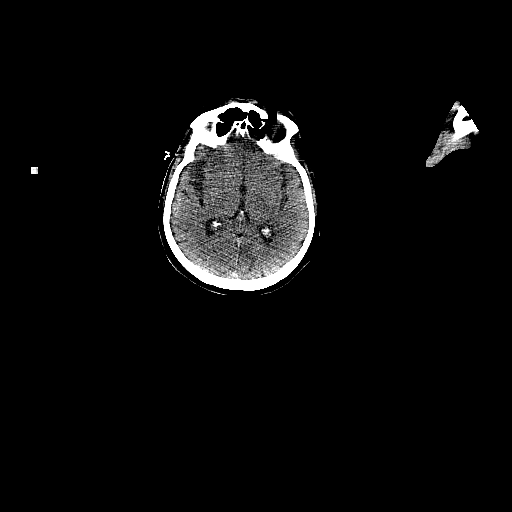

[Series 4: pet wb (ac) · axial · 5.0mm · 4.07mm/px · z∈[-1490,-506]mm · 4 of 329 slices shown]
[im 1/329]
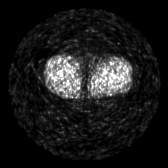
[im 110/329]
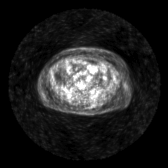
[im 219/329]
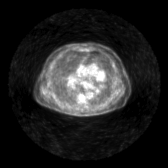
[im 329/329]
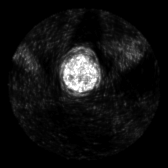

[Series 5: pet wb uncorrected (nac) · axial · 5.0mm · 4.07mm/px · z∈[-1490,-506]mm · 4 of 329 slices shown]
[im 1/329]
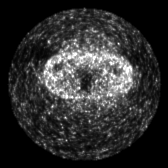
[im 110/329]
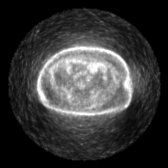
[im 219/329]
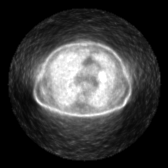
[im 329/329]
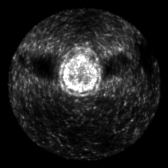

[Series 603: pet axial · 4 of 329 slices shown]
[im 1/329]
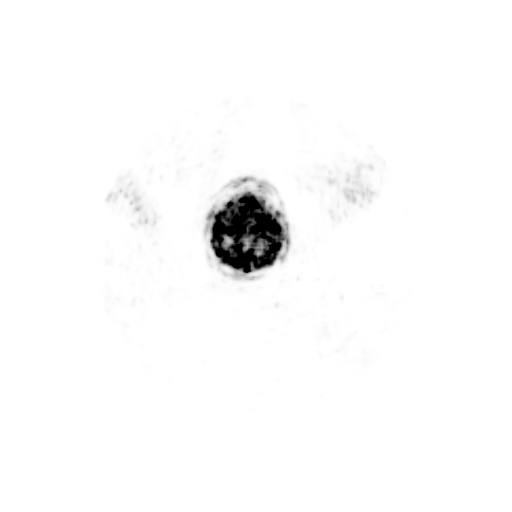
[im 110/329]
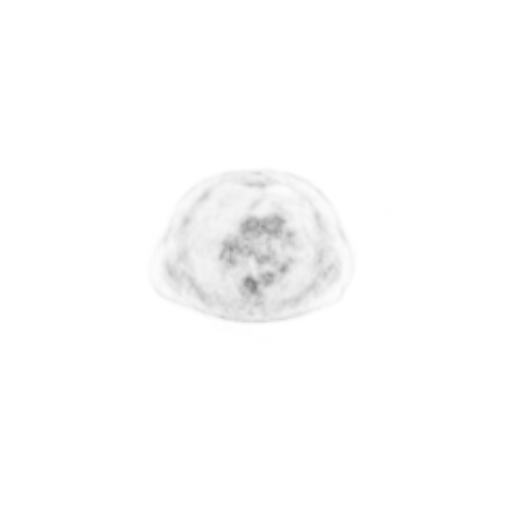
[im 219/329]
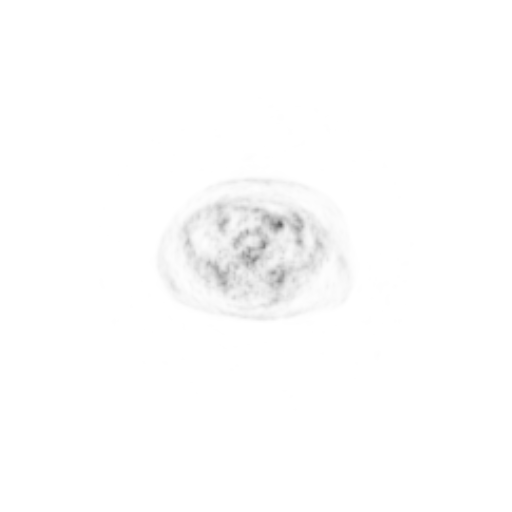
[im 329/329]
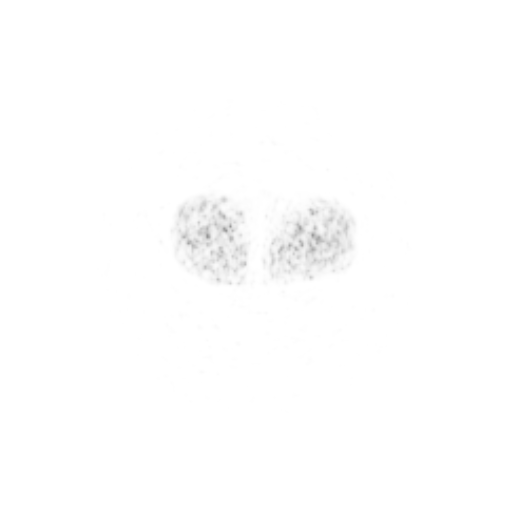

[Series 604: pet coronal · 1 of 112 slices shown]
[im 1/112]
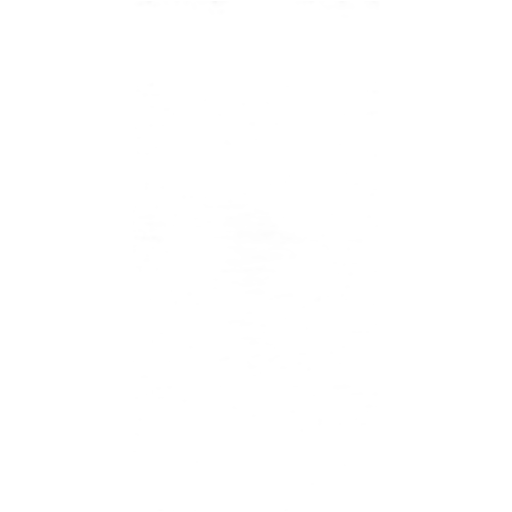

[Series 605: pet sagittal · 1 of 125 slices shown]
[im 1/125]
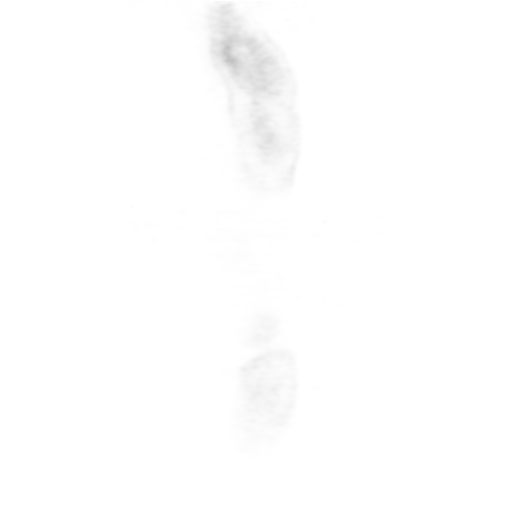

[Series 606: pet/ct axial · 4 of 327 slices shown]
[im 1/327]
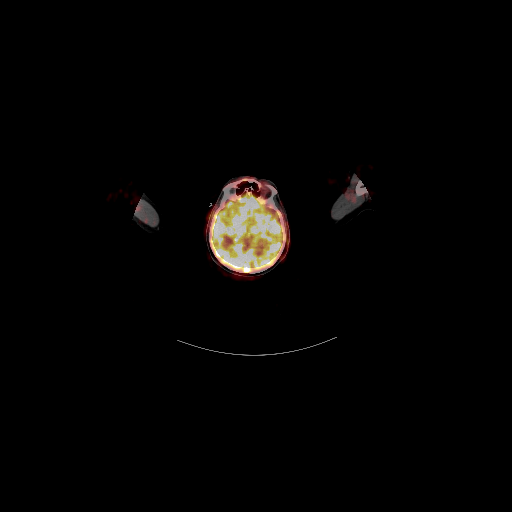
[im 109/327]
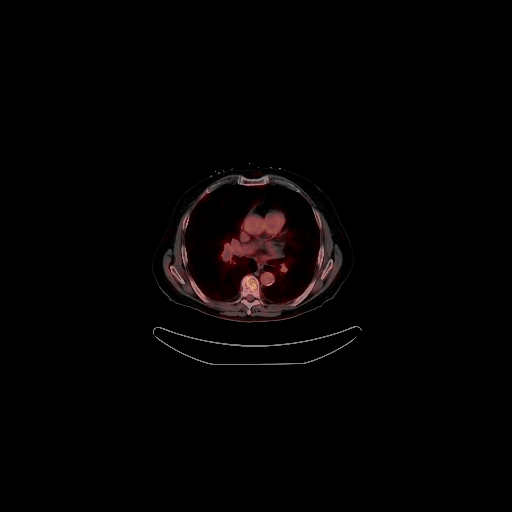
[im 218/327]
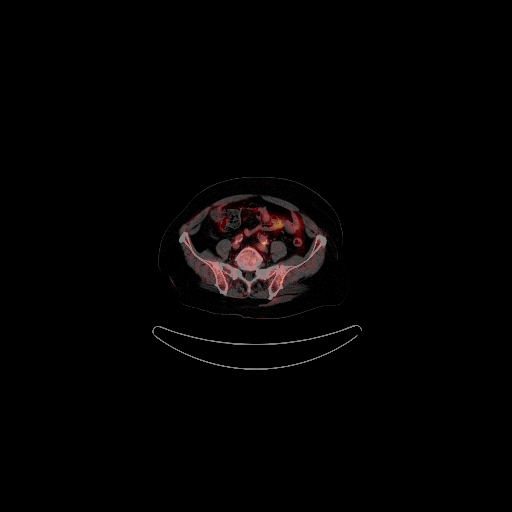
[im 327/327]
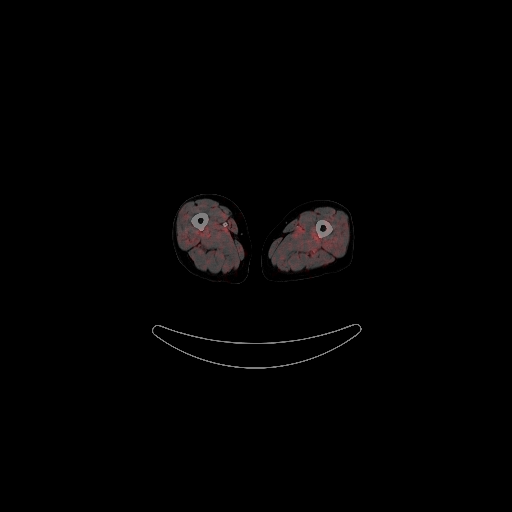

[Series 607: pet/ct coronal · 1 of 83 slices shown]
[im 1/83]
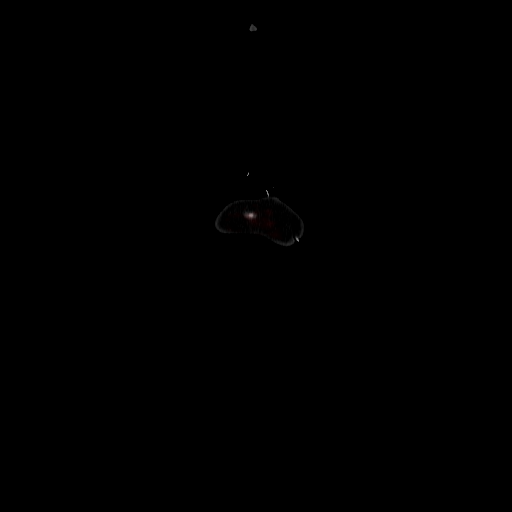

[Series 608: pet/ct sagittal · 2 of 131 slices shown]
[im 1/131]
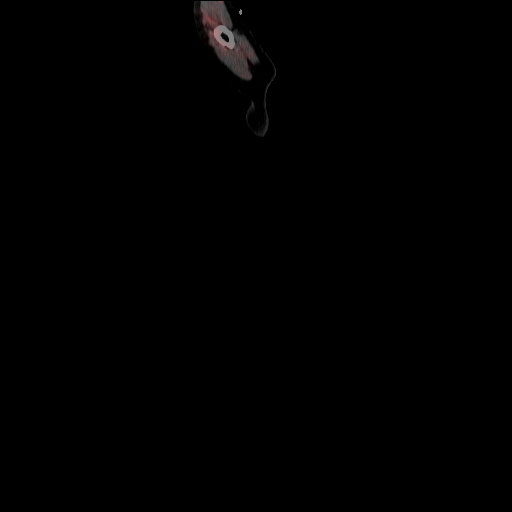
[im 131/131]
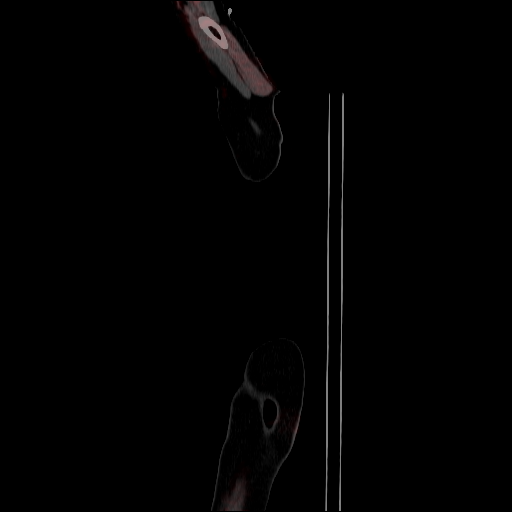

[Series 1044: results mm oncology reading · 1.08mm/px · 1 of 5 slices shown]
[im 1/5]
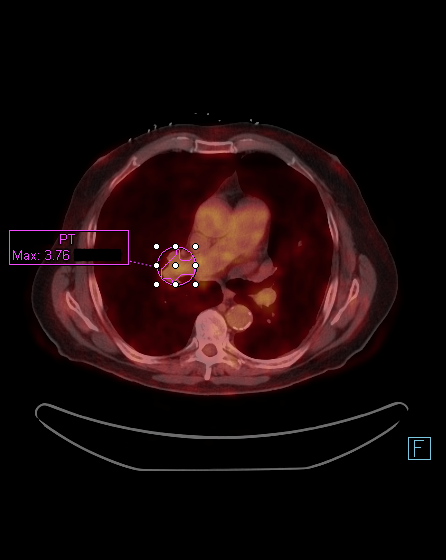

[25 of 25 positions shown; findings below may reference images not displayed]

FINDINGS: NECK

No hypermetabolic cervical lymph nodes are identified.There are no
lesions of the pharyngeal mucosal space. Central activity within the
tongue is likely physiologic. There is symmetric thyroid activity
and prominent muscular activity as well.

CHEST

The centrally cavitary right upper lobe nodule is hypermetabolic
with an SUV max of 5.6. The smaller superior component is also
mildly hypermetabolic with an SUV max of 5.5. The calcified right
upper lobe lesion demonstrates no abnormal metabolic activity. There
is low-level hypermetabolic activity within the right hilum. This
demonstrates an SUV max of 4.4 inferiorly. This appears to
correspond with the thromboembolic disease demonstrated on recent
CTA. There are calcified hilar lymph nodes bilaterally. However, I
do not see definite hypermetabolic nodal activity. There are
progressive dependent airspace opacities at both lung bases with
associated air bronchograms. The component on the right is
peripherally located and hypermetabolic (SUV max 5.1). This likely
represents a subacute pulmonary infarct.

ABDOMEN/PELVIS

There is no hypermetabolic activity within the liver, adrenal
glands, spleen or pancreas. There is no hypermetabolic nodal
activity. Incidental findings include the presence of a right
adrenal adenoma, hepatic steatosis, diffuse atherosclerosis and
moderate enlargement of the prostate gland. IVC filter has been
placed in the interval.

SKELETON

There is no hypermetabolic activity to suggest osseous metastatic
disease.
IMPRESSION: 1. The cavitary right upper lobe lesion is hypermetabolic as well as
an adjacent smaller spiculated density. These are consistent with
the given diagnosis of lung cancer. If histology has not been
confirmed, inflammatory foci should be considered. If that
possibility is entertained, CT follow-up in 2-3 months could be
performed to assess evolution.
2. Findings consistent with subacute right lower lobe infarction
with low-level activity within the right pulmonary artery,
corresponding with thromboembolic disease on recent CT.
3. Adjacent calcified hilar lymph nodes consistent with prior
granulomatous disease do not show any increased metabolic activity.
4. No evidence of distant metastatic disease.
5. Incidental findings including hepatic steatosis, a right adrenal
adenoma and diffuse atherosclerosis.

## 2016-07-10 DIAGNOSIS — S68129A Partial traumatic metacarpophalangeal amputation of unspecified finger, initial encounter: Secondary | ICD-10-CM | POA: Diagnosis not present

## 2016-07-11 ENCOUNTER — Other Ambulatory Visit: Payer: Self-pay | Admitting: Family Medicine

## 2016-07-12 NOTE — Telephone Encounter (Signed)
How is his hand? Is he still needing oxycodone for pain with that? How is his cough?   Let me know so I can see about the need for the refill on the tussionex.  Thanks.

## 2016-07-13 DIAGNOSIS — M9901 Segmental and somatic dysfunction of cervical region: Secondary | ICD-10-CM | POA: Diagnosis not present

## 2016-07-13 DIAGNOSIS — M5136 Other intervertebral disc degeneration, lumbar region: Secondary | ICD-10-CM | POA: Diagnosis not present

## 2016-07-13 DIAGNOSIS — M9903 Segmental and somatic dysfunction of lumbar region: Secondary | ICD-10-CM | POA: Diagnosis not present

## 2016-07-13 DIAGNOSIS — M62830 Muscle spasm of back: Secondary | ICD-10-CM | POA: Diagnosis not present

## 2016-07-13 MED ORDER — HYDROCOD POLST-CPM POLST ER 10-8 MG/5ML PO SUER: ORAL | 0 refills | Status: DC

## 2016-07-13 NOTE — Telephone Encounter (Signed)
Left message on patient's voicemail to return call.   Patient says his hand is doing well, stitches have been removed.  He has not taken any pain medication for his hand in over a week.  His cough is about the same.

## 2016-07-13 NOTE — Telephone Encounter (Signed)
Noted, med list updated.  Printed.  Thanks.

## 2016-07-14 NOTE — Telephone Encounter (Signed)
Patient advised.  Rx left at front desk for pick up. 

## 2016-07-17 ENCOUNTER — Ambulatory Visit
Admission: RE | Admit: 2016-07-17 | Discharge: 2016-07-17 | Disposition: A | Discharge status: Home / Self Care | Payer: Medicare Other | Source: Ambulatory Visit | Attending: Internal Medicine | Admitting: Internal Medicine

## 2016-07-17 ENCOUNTER — Other Ambulatory Visit: Payer: Self-pay | Admitting: Family Medicine

## 2016-07-17 DIAGNOSIS — I700 Atherosclerosis of aorta: Secondary | ICD-10-CM | POA: Diagnosis not present

## 2016-07-17 DIAGNOSIS — R05 Cough: Secondary | ICD-10-CM | POA: Diagnosis not present

## 2016-07-17 DIAGNOSIS — R911 Solitary pulmonary nodule: Secondary | ICD-10-CM | POA: Diagnosis not present

## 2016-07-17 DIAGNOSIS — R0602 Shortness of breath: Secondary | ICD-10-CM | POA: Diagnosis not present

## 2016-07-17 DIAGNOSIS — R053 Chronic cough: Secondary | ICD-10-CM

## 2016-07-20 ENCOUNTER — Encounter: Payer: Self-pay | Admitting: Internal Medicine

## 2016-07-20 ENCOUNTER — Ambulatory Visit (INDEPENDENT_AMBULATORY_CARE_PROVIDER_SITE_OTHER): Payer: Medicare Other | Admitting: Internal Medicine

## 2016-07-20 ENCOUNTER — Telehealth: Payer: Self-pay

## 2016-07-20 VITALS — BP 100/58 | HR 67 | Ht 69.5 in | Wt 217.0 lb

## 2016-07-20 DIAGNOSIS — J418 Mixed simple and mucopurulent chronic bronchitis: Secondary | ICD-10-CM

## 2016-07-20 DIAGNOSIS — G4733 Obstructive sleep apnea (adult) (pediatric): Secondary | ICD-10-CM

## 2016-07-20 DIAGNOSIS — I2510 Atherosclerotic heart disease of native coronary artery without angina pectoris: Secondary | ICD-10-CM | POA: Diagnosis not present

## 2016-07-20 DIAGNOSIS — R0602 Shortness of breath: Secondary | ICD-10-CM | POA: Diagnosis not present

## 2016-07-20 DIAGNOSIS — Z9989 Dependence on other enabling machines and devices: Principal | ICD-10-CM

## 2016-07-20 NOTE — Progress Notes (Signed)
[facility] Consultation      MRN# [id number] Christopher Santiago [birth year]   CC: Chief Complaint  Patient presents with  . Sleep Apnea    Currently using CPAP every night. Denies issues with machine, mask or pressure. DME is [facility].  Marland Kitchen COPD    Breathing is worse since last OV. Reports SOB and coughing. Cough can produce mucus at times. Denies chest tightness or wheezing.      Brief History: Christopher Santiago first saw the [facility] clinic in 05/2013 for COPD and cough. He had a lot of post nasal drip. PFT's showed moderate obstruction in 04/2013 (Ratio 47%, FEV1 1.73L, 54% predicted), noted to have a sleep study, on CPAP of 10   Events since last clinic visit: Patient presents today for follow-up visit, he has a history of COPD, obstructive sleep apnea. Patient states that he been using his new cpap machine since the last visit, 8-10per night, every night. States that has been a little more sob with exertion than usual, especially with walking dogs and walking up hill.     Medication:     Current Outpatient Prescriptions:  .  albuterol (PROVENTIL HFA;VENTOLIN HFA) 108 (90 BASE) MCG/ACT inhaler, Inhale 2 puffs into the lungs every 6 (six) hours as needed for wheezing or shortness of breath., Disp: 18 g, Rfl: 12 .  allopurinol (ZYLOPRIM) 100 MG tablet, Take 1 tablet (100 mg total) by mouth daily., Disp: 120 tablet, Rfl: 2 .  aspirin 81 MG EC tablet, Take 81 mg by mouth daily.  , Disp: , Rfl:  .  budesonide-formoterol (SYMBICORT) 160-4.5 MCG/ACT inhaler, Inhale 2 puffs into the lungs 2 (two) times daily. Rinse after use., Disp: 1 Inhaler, Rfl: 3 .  Cholecalciferol (VITAMIN D3) 1000 UNITS tablet, Take 2,000 Units by mouth daily. , Disp: , Rfl:  .  citalopram (CELEXA) 40 MG tablet, Take 1.5 tablets (60 mg total) by mouth daily., Disp: , Rfl:  .  losartan (COZAAR) 25 MG tablet, TAKE ONE TABLET BY MOUTH EVERY DAY, Disp: 30 tablet, Rfl: 3 .  metoprolol tartrate  (LOPRESSOR) 25 MG tablet, Take 25 mg by mouth 2 (two) times daily., Disp: , Rfl:  .  simvastatin (ZOCOR) 20 MG tablet, Take 1 tablet (20 mg total) by mouth at bedtime., Disp: 120 tablet, Rfl: 3 .  tamsulosin (FLOMAX) 0.4 MG CAPS capsule, TAKE 1 CAPSULE BY MOUTH ONCE DAILY, Disp: 120 capsule, Rfl: 1   Review of Systems  Constitutional: Positive for malaise/fatigue. Negative for chills and fever.  HENT: Negative for hearing loss.   Eyes: Negative for blurred vision.  Respiratory: Positive for cough, sputum production and shortness of breath. Negative for wheezing.   Cardiovascular: Negative for chest pain.  Gastrointestinal: Negative for heartburn and nausea.  Musculoskeletal: Negative for myalgias.  Skin: Negative for itching and rash.  Neurological: Positive for weakness. Negative for dizziness and headaches.  Endo/Heme/Allergies: Does not bruise/bleed easily.  Psychiatric/Behavioral: Negative for depression.      Allergies:  Tessalon [benzonatate]  Physical Examination:  VS: BP (!) 100/58 (BP Location: Left Arm)   Pulse 67   Ht 5' 9.5" (1.765 m)   Wt 217 lb (98.4 kg)   SpO2 98%   BMI 31.59 kg/m   General Appearance: No distress  HEENT: PERRLA, no ptosis, no other lesions noticed Pulmonary: Coarse upper airway sounds, diaphragmatic excursion normal.No wheezing, No rales   Cardiovascular:  Normal S1,S2.  No m/r/g.     Abdomen:Exam: Benign, Soft, non-tender,  No masses  Skin:   warm, no rashes, no ecchymosis  Extremities: normal, no cyanosis, clubbing, warm with normal capillary refill.      Rad results: (The following images and results were reviewed by Dr. Stevenson Clinch on 06/08/16). CXR 07/17/16 EXAM: CHEST  2 VIEW  COMPARISON:  Chest radiograph 02/10/2016 and chest CT 11/07/2015  FINDINGS: The lungs are well inflated. Nodular opacity in the right upper lung is unchanged. Left basilar atelectasis has improved. Aortic atherosclerosis is again seen. Cardiomediastinal  contours are otherwise normal. No pneumothorax or sizable pleural effusion. No focal airspace consolidation or pulmonary edema.  IMPRESSION: 1. No focal airspace disease. 2. Aortic atherosclerosis. 3. Unchanged radiographic appearance of right upper lobe pulmonary nodule.    Assessment and Plan: 82 year old male with a past medical history of COPD, chronic cough, OSA, presents for follow-up of OSA and COPD. OSA on CPAP Cont with CPAP nightly Currently with great compliance Follow up in 2 months  COPD (chronic obstructive pulmonary disease) ([place]) Unfortunately it sounds like Christopher Santiago's COPD got out of control this winter when he stopped taking his Symbicort for some reason. His PMD has restarted his symbicort.  He's currently not on Spiriva.   I have educated the patient about the use of his inhalers including maintenance and rescue inhaler. I agree with his PMD to continue with Symbicort.  Plan: -Continue Symbicort 160/4.5 -also will check ONO    Shortness of breath mutlifactorial - CAD, COPD, OSA, obesity  I suspect that he may have some night hypoxemia that could be adding to fatigue and SOB. Will check overnight oximetry test.    Updated Medication List Outpatient Encounter Prescriptions as of 07/20/2016  Medication Sig  . albuterol (PROVENTIL HFA;VENTOLIN HFA) 108 (90 BASE) MCG/ACT inhaler Inhale 2 puffs into the lungs every 6 (six) hours as needed for wheezing or shortness of breath.  . allopurinol (ZYLOPRIM) 100 MG tablet Take 1 tablet (100 mg total) by mouth daily.  Marland Kitchen aspirin 81 MG EC tablet Take 81 mg by mouth daily.    . budesonide-formoterol (SYMBICORT) 160-4.5 MCG/ACT inhaler Inhale 2 puffs into the lungs 2 (two) times daily. Rinse after use.  . Cholecalciferol (VITAMIN D3) 1000 UNITS tablet Take 2,000 Units by mouth daily.   . citalopram (CELEXA) 40 MG tablet Take 1.5 tablets (60 mg total) by mouth daily.  Marland Kitchen losartan (COZAAR) 25 MG tablet TAKE ONE TABLET BY  MOUTH EVERY DAY  . metoprolol tartrate (LOPRESSOR) 25 MG tablet Take 25 mg by mouth 2 (two) times daily.  . simvastatin (ZOCOR) 20 MG tablet Take 1 tablet (20 mg total) by mouth at bedtime.  . tamsulosin (FLOMAX) 0.4 MG CAPS capsule TAKE 1 CAPSULE BY MOUTH ONCE DAILY  . [DISCONTINUED] cephALEXin (KEFLEX) 500 MG capsule Take 1 capsule (500 mg total) by mouth 4 (four) times daily.  . [DISCONTINUED] chlorpheniramine-HYDROcodone (TUSSIONEX) 10-8 MG/5ML SUER TAKE 5 ML (1 TEASPOONFUL) BY MOUTH AT BEDTIME AS NEEDED  . [DISCONTINUED] citalopram (CELEXA) 40 MG tablet TAKE 1 AND 1/2 TABLETS BY MOUTH DAILY   No facility-administered encounter medications on file as of 07/20/2016.     Orders for this visit: Orders Placed This Encounter  Procedures  . Pulse oximetry, overnight    Standing Status:   Future    Standing Expiration Date:   07/20/2017    Scheduling Instructions:     On room air    Thank  you for the visitation and for allowing  [facility] to assist in  the care of your patient. Our recommendations are noted above.  Please contact us if we can be of further service.  Vilinda Boehringer, MD [facility] Office Number: [phone]  Note: This note was prepared with Dragon dictation along with smaller phrase technology. Any transcriptional errors that result from this process are unintentional.

## 2016-07-20 NOTE — Telephone Encounter (Signed)
Pt aware of CXR results. Pt voiced understanding. Nothing further needed.

## 2016-07-20 NOTE — Patient Instructions (Addendum)
Follow up with Dr. Stevenson Clinch in:2 months - cont with current inhalers - cont with CPAP at night - we will check an ONO - we will check an in office spirometry at your next visit.

## 2016-07-20 NOTE — Assessment & Plan Note (Signed)
mutlifactorial - CAD, COPD, OSA, obesity  I suspect that he may have some night hypoxemia that could be adding to fatigue and SOB. Will check overnight oximetry test.

## 2016-07-20 NOTE — Assessment & Plan Note (Signed)
Unfortunately it sounds like Christopher Santiago's COPD got out of control this winter when he stopped taking his Symbicort for some reason. His PMD has restarted his symbicort.  He's currently not on Spiriva.   I have educated the patient about the use of his inhalers including maintenance and rescue inhaler. I agree with his PMD to continue with Symbicort.  Plan: -Continue Symbicort 160/4.5 -also will check ONO

## 2016-07-20 NOTE — Assessment & Plan Note (Signed)
Cont with CPAP nightly Currently with great compliance Follow up in 2 months

## 2016-07-24 ENCOUNTER — Other Ambulatory Visit: Payer: Self-pay

## 2016-07-24 ENCOUNTER — Telehealth: Payer: Self-pay | Admitting: Internal Medicine

## 2016-07-24 DIAGNOSIS — S68129A Partial traumatic metacarpophalangeal amputation of unspecified finger, initial encounter: Secondary | ICD-10-CM | POA: Diagnosis not present

## 2016-07-24 MED ORDER — SIMVASTATIN 20 MG PO TABS: 20.0000 mg | ORAL_TABLET | Freq: Every day | ORAL | 3 refills | Status: DC

## 2016-07-24 NOTE — Telephone Encounter (Signed)
Spoke with pt and he states [facility] called him today but seemed as if they didn't know what they were suppose to be doing for him. Informed pt we would f/u with [facility] and call him back. He states he will be in town next week but the following week until end of Sept he will be out of town.

## 2016-07-24 NOTE — Telephone Encounter (Signed)
Pt states he would like to know about his arrangements for [facility] regarding some equipment. pleae advise.

## 2016-07-28 NOTE — Telephone Encounter (Signed)
Called and spoke with patient and he stated that he will be leaving next week, will be available to do test this week if [facility] will be able to bring equipment. Called and left VM for Jason P with [facility] to return my call. Rhonda J Cobb

## 2016-07-28 NOTE — Telephone Encounter (Signed)
Called and left VM for Jiles Crocker with [facility] and also sent staff message to advise [facility] to please try to get ONO scheduled within the next 2-3 days. Corene Cornea responded via staff message and sent message to RT team to contact pt to arrange. Pt is concerned that he may need o2 and requesting test before the end of September. [facility] to try to reach out to patient to schedule. Rhonda J Cobb  Nothing else needed at this time. Rhonda J Cobb

## 2016-08-03 ENCOUNTER — Encounter: Payer: Self-pay | Admitting: Family Medicine

## 2016-08-04 ENCOUNTER — Encounter: Payer: Self-pay | Admitting: Internal Medicine

## 2016-08-04 DIAGNOSIS — G4733 Obstructive sleep apnea (adult) (pediatric): Secondary | ICD-10-CM | POA: Diagnosis not present

## 2016-08-04 DIAGNOSIS — J449 Chronic obstructive pulmonary disease, unspecified: Secondary | ICD-10-CM | POA: Diagnosis not present

## 2016-08-07 ENCOUNTER — Encounter: Payer: Self-pay | Admitting: Family Medicine

## 2016-08-07 ENCOUNTER — Ambulatory Visit (INDEPENDENT_AMBULATORY_CARE_PROVIDER_SITE_OTHER): Payer: Medicare Other | Admitting: Family Medicine

## 2016-08-07 VITALS — BP 122/60 | HR 53 | Temp 98.7°F | Ht 70.0 in | Wt 219.5 lb

## 2016-08-07 DIAGNOSIS — I2510 Atherosclerotic heart disease of native coronary artery without angina pectoris: Secondary | ICD-10-CM | POA: Diagnosis not present

## 2016-08-07 DIAGNOSIS — Z23 Encounter for immunization: Secondary | ICD-10-CM | POA: Diagnosis not present

## 2016-08-07 DIAGNOSIS — F32A Depression, unspecified: Secondary | ICD-10-CM

## 2016-08-07 DIAGNOSIS — J418 Mixed simple and mucopurulent chronic bronchitis: Secondary | ICD-10-CM | POA: Diagnosis not present

## 2016-08-07 DIAGNOSIS — N400 Enlarged prostate without lower urinary tract symptoms: Secondary | ICD-10-CM

## 2016-08-07 DIAGNOSIS — I10 Essential (primary) hypertension: Secondary | ICD-10-CM | POA: Diagnosis not present

## 2016-08-07 DIAGNOSIS — F329 Major depressive disorder, single episode, unspecified: Secondary | ICD-10-CM

## 2016-08-07 DIAGNOSIS — R32 Unspecified urinary incontinence: Secondary | ICD-10-CM

## 2016-08-07 MED ORDER — HYDROCOD POLST-CPM POLST ER 10-8 MG/5ML PO SUER: 5.0000 mL | Freq: Every evening | ORAL | 0 refills | Status: DC | PRN

## 2016-08-07 NOTE — Progress Notes (Signed)
Pre visit review using our clinic review tool, if applicable. No additional management support is needed unless otherwise documented below in the visit note. 

## 2016-08-07 NOTE — Progress Notes (Signed)
Fall risk - hx of multiple falls with injury.  D/w pt about fall cautions.    Inhaler discussion.  Taking tussionex qhs prn cough.   Still on symbicort and prn SABA.  He hasn't noted changes but his wife has noted he is less SOB on symbicort.  No ADE on med.  Compliant with symbicort.    Lightheaded.  Lower BP noted today.  Noted when getting out of bed.  BP has been 100/42 at home.  He doesn't have vertigo.  He can get SOB with exertion, unclear if low BP related.    Overnight sleep study recently done.  I'm awaiting report on study, done per pulmonary.    On flomax.  Having to wear pads.  Leaking urine.  Needs rx for pads with dx bladder incontinence.  He has seen Dr. Vanessa Ralphs with uro.  Hardcopy of rx done for patient for 100 pads with refills.  Dx R32.   Flu shot today.    Mood is okay.  Enthusiasm is "not what it was."  Unclear if this is exacerbated by lower BP.  No "bad thoughts."    Memory testing.  A&O x3.  3/3 recall.  Can do math.  Can read a watch.  He has trouble with names but that is typical for him.   Advance directive- wife designated if patient were incapacitated.   PMH and SH reviewed  ROS: Per HPI unless specifically indicated in ROS section   Meds, vitals, and allergies reviewed.   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

## 2016-08-07 NOTE — Patient Instructions (Addendum)
Stop the losartan and update me about your BP, energy level, and lightheadedness in about 1-2 weeks.  Rosaria Ferries will call about your referral. Take care.  Glad to see you.

## 2016-08-09 NOTE — Assessment & Plan Note (Signed)
His mood is fair. See above. His enthusiasm is "not what was". It is unclear if this is exacerbated by is lower blood pressure. We'll see how he does off losartan. He'll update me as needed. At this point still okay for outpatient follow-up. He agrees.

## 2016-08-09 NOTE — Assessment & Plan Note (Signed)
Stop losartan. He'll update me about his energy level, his blood pressure, and lightheadedness as he has been off the medication. See after visit summary. He agrees.

## 2016-08-09 NOTE — Assessment & Plan Note (Addendum)
He is likely some better now that he is taking Symbicort. Continue this daily. Discussed with patient about prophylactic versus abortive treatment. It seems that to see neck was the only thing that was helping his cough at night. Continue as is. Sedation caution. No adverse effect otherwise. No abuse of medicine. I will await the overnight sleep study was recently done per pulmonary.

## 2016-08-09 NOTE — Assessment & Plan Note (Addendum)
Refer back to urology. I wrote hardcopy prescription for him to get his pads covered. He is leaking urine episodically. Dx R32.

## 2016-08-10 ENCOUNTER — Ambulatory Visit: Payer: Medicare Other | Admitting: Family Medicine

## 2016-08-11 ENCOUNTER — Telehealth: Payer: Self-pay | Admitting: *Deleted

## 2016-08-11 DIAGNOSIS — J449 Chronic obstructive pulmonary disease, unspecified: Secondary | ICD-10-CM

## 2016-08-11 NOTE — Telephone Encounter (Signed)
Pt informed of ONO results and that he needs to be setup for on O2 for night time use. Order placed. Nothing further needed.

## 2016-08-12 ENCOUNTER — Telehealth: Payer: Self-pay | Admitting: Internal Medicine

## 2016-08-12 NOTE — Telephone Encounter (Signed)
See if he can come in for a 6MWT

## 2016-08-12 NOTE — Telephone Encounter (Signed)
[name] with [name] called and stated that due to patient's primary insurance, and patient has a diagnosis of OSA as well as COPD, in order for patient to be able to obtain o2 at night. Pt will have to either 1) go into the sleep lab and have a CPAP Titration Study with o2 to be bled in as needed or 2) Bring patient in for SMW and see if he qualifies for o2 during the day as well.   These are our 2 options for getting Christopher Santiago his o2 at night. Please advise.  Rhonda J Cobb

## 2016-08-13 ENCOUNTER — Other Ambulatory Visit: Payer: Self-pay

## 2016-08-13 DIAGNOSIS — J449 Chronic obstructive pulmonary disease, unspecified: Secondary | ICD-10-CM

## 2016-08-13 NOTE — Telephone Encounter (Signed)
Contacted patient and explained his options to get qualified for his o2. Pt stated that he would prefer to do a SMW. Rhonda J Cobb

## 2016-08-14 NOTE — Telephone Encounter (Signed)
SMW scheduled for 08/17/16 at 3:30 pm at [facility]. Pt is aware and instructions for the SMW were 3-mailed to the patient. Nothing else needed at this time. Pt is aware of appointment date, time and location. Rhonda J Cobb

## 2016-08-17 ENCOUNTER — Ambulatory Visit (INDEPENDENT_AMBULATORY_CARE_PROVIDER_SITE_OTHER): Payer: Medicare Other | Admitting: *Deleted

## 2016-08-17 DIAGNOSIS — J449 Chronic obstructive pulmonary disease, unspecified: Secondary | ICD-10-CM

## 2016-08-17 NOTE — Progress Notes (Signed)
SMW performed today. 

## 2016-08-20 ENCOUNTER — Telehealth: Payer: Self-pay

## 2016-08-20 ENCOUNTER — Encounter: Payer: Self-pay | Admitting: Family Medicine

## 2016-08-20 ENCOUNTER — Other Ambulatory Visit: Payer: Self-pay | Admitting: Family Medicine

## 2016-08-20 ENCOUNTER — Telehealth: Payer: Self-pay | Admitting: Family Medicine

## 2016-08-20 DIAGNOSIS — N400 Enlarged prostate without lower urinary tract symptoms: Secondary | ICD-10-CM

## 2016-08-20 NOTE — Telephone Encounter (Signed)
MyChart RF request.   Last Filled:    140 mL 0 08/07/2016  Please advise.

## 2016-08-20 NOTE — Telephone Encounter (Signed)
/ /  Pt called back; Chris at [facility] said pt has availablerefills on celexa and tussionex was sold on 08/11/16. I advised pt and he found a bottle dated 06/2016 for tussionex. Pt is to look for tussionex bottle dated 08/11/16 and if problem will cb. FYI to Dr Damita Dunnings.

## 2016-08-20 NOTE — Telephone Encounter (Signed)
See my chart message

## 2016-08-20 NOTE — Telephone Encounter (Signed)
Pt returned your call - please call [phone] Thanks

## 2016-08-20 NOTE — Telephone Encounter (Signed)
Patient left message on triage line requesting a refill for what sounded like: Topamax; however, patient is not on this medication.  I do see where he has had R/X's for flomax and tussionex recently but am unable to confirm which he is asking for right now.  I left a message on patient's home number:  [phone] for him to call back with a clarification and to spell the name of the medication that he is requesting and we will be happy to look in to this for him.

## 2016-08-20 NOTE — Telephone Encounter (Signed)
Noted.  Thanks.  I denied these.

## 2016-08-29 ENCOUNTER — Other Ambulatory Visit: Payer: Self-pay | Admitting: Family Medicine

## 2016-09-01 ENCOUNTER — Ambulatory Visit: Payer: Medicare Other | Admitting: Family Medicine

## 2016-09-17 ENCOUNTER — Ambulatory Visit: Payer: Medicare Other | Admitting: Internal Medicine

## 2016-09-22 ENCOUNTER — Ambulatory Visit (INDEPENDENT_AMBULATORY_CARE_PROVIDER_SITE_OTHER): Payer: Medicare Other | Admitting: Internal Medicine

## 2016-09-22 ENCOUNTER — Encounter: Payer: Self-pay | Admitting: Internal Medicine

## 2016-09-22 VITALS — BP 136/72 | HR 63 | Wt 214.0 lb

## 2016-09-22 DIAGNOSIS — R5383 Other fatigue: Secondary | ICD-10-CM

## 2016-09-22 DIAGNOSIS — Z9989 Dependence on other enabling machines and devices: Secondary | ICD-10-CM

## 2016-09-22 DIAGNOSIS — R918 Other nonspecific abnormal finding of lung field: Secondary | ICD-10-CM | POA: Diagnosis not present

## 2016-09-22 DIAGNOSIS — G4733 Obstructive sleep apnea (adult) (pediatric): Secondary | ICD-10-CM

## 2016-09-22 DIAGNOSIS — D649 Anemia, unspecified: Secondary | ICD-10-CM

## 2016-09-22 DIAGNOSIS — J418 Mixed simple and mucopurulent chronic bronchitis: Secondary | ICD-10-CM

## 2016-09-22 DIAGNOSIS — I2510 Atherosclerotic heart disease of native coronary artery without angina pectoris: Secondary | ICD-10-CM

## 2016-09-22 DIAGNOSIS — J449 Chronic obstructive pulmonary disease, unspecified: Secondary | ICD-10-CM

## 2016-09-22 NOTE — Assessment & Plan Note (Signed)
I have educated the patient about the use of his inhalers including maintenance and rescue inhaler. Continue with Symbicort.  Plan: -Continue Symbicort 160/4.5 - 2 puff BID

## 2016-09-22 NOTE — Progress Notes (Signed)
[facility] Consultation      MRN# [id number] Christopher Santiago Sep 20, 1931   CC: Chief Complaint  Patient presents with  . Follow-up    SOB w/exertion: prod cough w/clear mucus, mainlt at night; doing well on CPAP      Brief History: Christopher Santiago first saw the [facility] clinic in 05/2013 for COPD and cough. He had a lot of post nasal drip. PFT's showed moderate obstruction in 04/2013 (Ratio 47%, FEV1 1.73L, 54% predicted), noted to have a sleep study, on CPAP of 10. Hx of RUL cavitary lesion 10/2015, getting smaller.    Events since last clinic visit: Patient presents today for follow-up visit, he has a history of COPD, obstructive sleep apnea. Patient states that he been using his new cpap machine since the last visit, 8-10 hrs per night, every night. States that has been a little more sob with exertion than usual, especially with walking dogs and walking up hill. Noticed that his breathing is gradually declining over the past several months.  Still with nighttime cough (chronic) Using tussionex for the cough at nighttime  Complained of some numbness and leg movements during sleep .     Medication:     Current Outpatient Prescriptions:  .  albuterol (PROVENTIL HFA;VENTOLIN HFA) 108 (90 BASE) MCG/ACT inhaler, Inhale 2 puffs into the lungs every 6 (six) hours as needed for wheezing or shortness of breath., Disp: 18 g, Rfl: 12 .  allopurinol (ZYLOPRIM) 100 MG tablet, Take 1 tablet (100 mg total) by mouth daily., Disp: 120 tablet, Rfl: 2 .  aspirin 81 MG EC tablet, Take 81 mg by mouth daily.  , Disp: , Rfl:  .  chlorpheniramine-HYDROcodone (TUSSIONEX PENNKINETIC ER) 10-8 MG/5ML SUER, Take 5 mLs by mouth at bedtime as needed for cough., Disp: 140 mL, Rfl: 0 .  Cholecalciferol 2000 units CAPS, Take 2,000 Units by mouth daily at 12 noon., Disp: , Rfl:  .  citalopram (CELEXA) 40 MG tablet, Take 1.5 tablets (60 mg total) by mouth daily., Disp: , Rfl:  .   metoprolol tartrate (LOPRESSOR) 25 MG tablet, Take 25 mg by mouth 2 (two) times daily., Disp: , Rfl:  .  simvastatin (ZOCOR) 20 MG tablet, Take 1 tablet (20 mg total) by mouth at bedtime., Disp: 120 tablet, Rfl: 3 .  SYMBICORT 160-4.5 MCG/ACT inhaler, INHALE 2 PUFFS INTO THE LUNGS TWICE DAILY *RINSE AFTER USE*, Disp: 10.2 g, Rfl: 3 .  tamsulosin (FLOMAX) 0.4 MG CAPS capsule, TAKE 1 CAPSULE BY MOUTH ONCE DAILY, Disp: 120 capsule, Rfl: 1   Review of Systems  Constitutional: Positive for malaise/fatigue. Negative for chills and fever.  HENT: Negative for hearing loss.   Eyes: Negative for blurred vision.  Respiratory: Positive for cough, sputum production and shortness of breath. Negative for wheezing.   Cardiovascular: Negative for chest pain.  Gastrointestinal: Negative for heartburn and nausea.  Musculoskeletal: Negative for myalgias.  Skin: Negative for itching and rash.  Neurological: Positive for weakness. Negative for dizziness and headaches.  Endo/Heme/Allergies: Does not bruise/bleed easily.  Psychiatric/Behavioral: Negative for depression.      Allergies:  Tessalon [benzonatate]  Physical Examination:  VS: BP 136/72 (BP Location: Left Arm, Cuff Size: Normal)   Pulse 63   Wt 214 lb (97.1 kg)   SpO2 93%   BMI 30.71 kg/m   General Appearance: No distress  HEENT: PERRLA, no ptosis, no other lesions noticed Pulmonary: Coarse upper airway sounds, diaphragmatic excursion normal.No wheezing, No rales  Cardiovascular:  Normal S1,S2.  No m/r/g.     Abdomen:Exam: Benign, Soft, non-tender, No masses  Skin:   warm, no rashes, no ecchymosis  Extremities: normal, no cyanosis, clubbing, warm with normal capillary refill.      Rad results: (The following images and results were reviewed by Christopher Santiago on 06/08/16). CXR 07/17/16 EXAM: CHEST  2 VIEW  COMPARISON:  Chest radiograph 02/10/2016 and chest CT 11/07/2015  FINDINGS: The lungs are well inflated. Nodular opacity in the  right upper lung is unchanged. Left basilar atelectasis has improved. Aortic atherosclerosis is again seen. Cardiomediastinal contours are otherwise normal. No pneumothorax or sizable pleural effusion. No focal airspace consolidation or pulmonary edema.  IMPRESSION: 1. No focal airspace disease. 2. Aortic atherosclerosis. 3. Unchanged radiographic appearance of right upper lobe pulmonary nodule.     6MWT- total distance 748 feet, lowest saturation 95%, highest heart rate 89  Assessment and Plan: 81 year old male with a past medical history of COPD, chronic cough, OSA, presents for follow-up of OSA and COPD. RUL lung Opacity Hx of cavitary PNA in the RUL, getting smaller per CT 10/2015 Given chronic cough and inc sob/doe will plan for a follow up CT w/o contrast  Plan - CT chest w/o contrast prior to follow up visit.  OSA on CPAP Cont with CPAP nightly Currently with great compliance (92% greater than 4 hours, AHI 3.0, leak 52.1) Follow up in 2 months Complained of some numbness and leg movements during sleep .  -check CBC, iron, ferritin, transferrin levels  COPD (chronic obstructive pulmonary disease) ([place]) I have educated the patient about the use of his inhalers including maintenance and rescue inhaler. Continue with Symbicort.  Plan: -Continue Symbicort 160/4.5 - 2 puff BID      Updated Medication List Outpatient Encounter Prescriptions as of 09/22/2016  Medication Sig  . albuterol (PROVENTIL HFA;VENTOLIN HFA) 108 (90 BASE) MCG/ACT inhaler Inhale 2 puffs into the lungs every 6 (six) hours as needed for wheezing or shortness of breath.  . allopurinol (ZYLOPRIM) 100 MG tablet Take 1 tablet (100 mg total) by mouth daily.  Marland Kitchen aspirin 81 MG EC tablet Take 81 mg by mouth daily.    . chlorpheniramine-HYDROcodone (TUSSIONEX PENNKINETIC ER) 10-8 MG/5ML SUER Take 5 mLs by mouth at bedtime as needed for cough.  . Cholecalciferol 2000 units CAPS Take 2,000 Units by mouth  daily at 12 noon.  . citalopram (CELEXA) 40 MG tablet Take 1.5 tablets (60 mg total) by mouth daily.  . metoprolol tartrate (LOPRESSOR) 25 MG tablet Take 25 mg by mouth 2 (two) times daily.  . simvastatin (ZOCOR) 20 MG tablet Take 1 tablet (20 mg total) by mouth at bedtime.  . SYMBICORT 160-4.5 MCG/ACT inhaler INHALE 2 PUFFS INTO THE LUNGS TWICE DAILY *RINSE AFTER USE*  . tamsulosin (FLOMAX) 0.4 MG CAPS capsule TAKE 1 CAPSULE BY MOUTH ONCE DAILY   No facility-administered encounter medications on file as of 09/22/2016.     Orders for this visit: Orders Placed This Encounter  Procedures  . CT CHEST WO CONTRAST    Standing Status:   Future    Standing Expiration Date:   11/22/2017    Order Specific Question:   Reason for Exam (SYMPTOM  OR DIAGNOSIS REQUIRED)    Answer:   RUL Lung opacity    Order Specific Question:   Preferred imaging location?    Answer:   [facility]  . CBC w/Diff    Standing Status:   Future    Standing  Expiration Date:   09/22/2017  . Ferritin  . Transferrin    Thank  you for the visitation and for allowing  [facility] to assist in the care of your patient. Our recommendations are noted above.  Please contact us if we can be of further service.  Vilinda Boehringer, MD [facility] Office Number: [phone]  Note: This note was prepared with Dragon dictation along with smaller phrase technology. Any transcriptional errors that result from this process are unintentional.

## 2016-09-22 NOTE — Assessment & Plan Note (Signed)
Hx of cavitary PNA in the RUL, getting smaller per CT 10/2015 Given chronic cough and inc sob/doe will plan for a follow up CT w/o contrast  Plan - CT chest w/o contrast prior to follow up visit.

## 2016-09-22 NOTE — Patient Instructions (Addendum)
Follow up with Dr. Ashby Dawes (49mn) in 6 weeks - CT chest w/o contrast prior to follow up visit for RUL lung opacity - cont with inhalers - cont with diet and exercise as tolerated.  -check CBC, iron, ferritin, transferrin levels prior to follow up visit.

## 2016-09-22 NOTE — Assessment & Plan Note (Addendum)
Cont with CPAP nightly Currently with great compliance (92% greater than 4 hours, AHI 3.0, leak 52.1) Follow up in 2 months Complained of some numbness and leg movements during sleep .  -check CBC, iron, ferritin, transferrin levels

## 2016-09-23 ENCOUNTER — Other Ambulatory Visit: Payer: Self-pay | Admitting: Family Medicine

## 2016-09-23 NOTE — Telephone Encounter (Signed)
Pt left v/m requesting status of tussionex rx. That was previously requested.

## 2016-09-24 ENCOUNTER — Other Ambulatory Visit: Payer: Self-pay | Admitting: Family Medicine

## 2016-09-24 MED ORDER — HYDROCOD POLST-CPM POLST ER 10-8 MG/5ML PO SUER: 5.0000 mL | Freq: Every evening | ORAL | 0 refills | Status: DC | PRN

## 2016-09-24 NOTE — Telephone Encounter (Signed)
Patient advised.  Rx left at front desk for pick up. 

## 2016-09-24 NOTE — Telephone Encounter (Signed)
Printed.  Thanks.  

## 2016-10-01 ENCOUNTER — Other Ambulatory Visit: Payer: Self-pay | Admitting: Family Medicine

## 2016-10-23 DIAGNOSIS — N3941 Urge incontinence: Secondary | ICD-10-CM | POA: Diagnosis not present

## 2016-10-23 DIAGNOSIS — N401 Enlarged prostate with lower urinary tract symptoms: Secondary | ICD-10-CM | POA: Diagnosis not present

## 2016-11-04 ENCOUNTER — Other Ambulatory Visit: Payer: Self-pay | Admitting: Family Medicine

## 2016-11-04 NOTE — Telephone Encounter (Signed)
Received refill request electronically Last refill  Tussionex 09/24/16 Last refill Albuterol 09/24/16 Celexa noted 3/17 ? Last refill Last office visit 08/07/16

## 2016-11-05 MED ORDER — HYDROCOD POLST-CPM POLST ER 10-8 MG/5ML PO SUER: 5.0000 mL | Freq: Every evening | ORAL | 0 refills | Status: DC | PRN

## 2016-11-05 MED ORDER — CITALOPRAM HYDROBROMIDE 40 MG PO TABS: 60.0000 mg | ORAL_TABLET | Freq: Every day | ORAL | 1 refills | Status: DC

## 2016-11-05 MED ORDER — ALBUTEROL SULFATE HFA 108 (90 BASE) MCG/ACT IN AERS: 1.0000 | INHALATION_SPRAY | Freq: Four times a day (QID) | RESPIRATORY_TRACT | 2 refills | Status: DC | PRN

## 2016-11-05 NOTE — Telephone Encounter (Signed)
tussionex printed.  Others sent.  Thanks.

## 2016-11-05 NOTE — Telephone Encounter (Signed)
Patient advised.  Rx left at front desk for pick up. 

## 2016-11-06 ENCOUNTER — Ambulatory Visit
Admission: RE | Admit: 2016-11-06 | Discharge: 2016-11-06 | Disposition: A | Discharge status: Home / Self Care | Payer: Medicare Other | Source: Ambulatory Visit | Attending: Internal Medicine | Admitting: Internal Medicine

## 2016-11-06 DIAGNOSIS — J439 Emphysema, unspecified: Secondary | ICD-10-CM | POA: Insufficient documentation

## 2016-11-06 DIAGNOSIS — R918 Other nonspecific abnormal finding of lung field: Secondary | ICD-10-CM | POA: Diagnosis not present

## 2016-11-06 DIAGNOSIS — I2510 Atherosclerotic heart disease of native coronary artery without angina pectoris: Secondary | ICD-10-CM | POA: Diagnosis not present

## 2016-11-06 DIAGNOSIS — I313 Pericardial effusion (noninflammatory): Secondary | ICD-10-CM | POA: Insufficient documentation

## 2016-11-06 DIAGNOSIS — D3501 Benign neoplasm of right adrenal gland: Secondary | ICD-10-CM | POA: Insufficient documentation

## 2016-11-06 DIAGNOSIS — M47814 Spondylosis without myelopathy or radiculopathy, thoracic region: Secondary | ICD-10-CM | POA: Insufficient documentation

## 2016-11-06 DIAGNOSIS — Z86711 Personal history of pulmonary embolism: Secondary | ICD-10-CM | POA: Insufficient documentation

## 2016-11-06 DIAGNOSIS — I712 Thoracic aortic aneurysm, without rupture: Secondary | ICD-10-CM | POA: Insufficient documentation

## 2016-11-06 DIAGNOSIS — I700 Atherosclerosis of aorta: Secondary | ICD-10-CM | POA: Insufficient documentation

## 2016-11-09 ENCOUNTER — Encounter: Payer: Self-pay | Admitting: Internal Medicine

## 2016-11-09 NOTE — Progress Notes (Signed)
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  --Continue symbicort, albuterol.  --He is getting symbicort for free, but notes it is expensive when paying for it. He is asked to call when he runs out of samples, and we can start him on generic salmeterol/fluticason.   Obstructive sleep apnea  --Continue using CPAP every night.   Bronchiectasis.  --Seen on CT scanning, he has excess mucus production in the am, but other than that does well.   Lung nodule.  --Stable lung nodules/scarring for 1 year.  --Repeat Ct scan in December of 2018 to complete surveillance. Will order at next visit.   History of Pulmonary embolism.  -- No longer on eliquis, s/p filter in right upper extremity.    CC: Chief Complaint  Patient presents with  . Follow-up    6wk rov. CT 11/06/16. pt wearing cpap avg 8-9hr nightly, feels pressure & mask are okay. DME:[facility]. pt c/o sob with exertion & dry cough mainly at night      Brief History: Christopher Santiago first saw the [facility] clinic in 05/2013 for COPD and cough. He had a lot of post nasal drip. PFT's showed moderate obstruction in 04/2013 (Ratio 47%, FEV1 1.73L, 54% predicted), noted to have a sleep study, on CPAP of 10. Hx of RUL cavitary lesion 10/2015, getting smaller.   Patient presents today for follow-up visit, he has a history of COPD, obstructive sleep apnea, lung nodule.He notes that he has weakness, he feels that his breathing ok with rest, but he gets winded with about 100 feet of exertion to his mailbox or walking up basement steps.  He is taking symbicort 2 puffs bid, and feels that it is helping. He has a rescue inhaler which he takes it about 4 days per week.  He took spiriva for a few years but did not notice any difference and it was too expensive.  He has already completed pulmonary rehab.   He has been using  cpap every night, he recently got a new machine and appears to be doing well with it. He notes that he is more awake during the day and feeling more rested. His wife also has a CPAP.   Ct chest  11/06/16 On my review there is scarring/nodule in the RUL, which is unchanged. Mild bullous emphysema, bronchiectasis. Per report there is no significant change since 11/07/2015.  IMPRESSION: 1. Stable bilateral scarring. Stable right upper lobe nodules, 1 of which may be faintly calcified but the other of which is very densely calcified. The appearance is compatible with benign process. 2. Emphysema. 3. Old granulomatous disease.    Medication:     Current Outpatient Prescriptions:  .  albuterol (VENTOLIN HFA) 108 (90 Base) MCG/ACT inhaler, Inhale 1-2 puffs into the lungs every 6 (six) hours as needed for wheezing or shortness of breath., Disp: 18 g, Rfl: 2 .  allopurinol (ZYLOPRIM) 100 MG tablet, Take 1 tablet (100 mg total) by mouth daily., Disp: 120 tablet, Rfl: 2 .  aspirin 81 MG EC tablet, Take 81 mg by mouth daily.  , Disp: , Rfl:  .  chlorpheniramine-HYDROcodone (TUSSIONEX PENNKINETIC ER) 10-8 MG/5ML SUER, Take 5 mLs by mouth at bedtime as needed for cough., Disp: 140 mL, Rfl: 0 .  Cholecalciferol 2000 units CAPS, Take 2,000 Units by mouth daily at 12 noon., Disp: , Rfl:  .  citalopram (CELEXA) 40 MG tablet, Take 1.5 tablets (60 mg total) by mouth daily., Disp: 135 tablet, Rfl: 1 .  metoprolol tartrate (LOPRESSOR) 25 MG tablet, Take 25 mg by mouth 2 (two) times daily., Disp: , Rfl:  .  metoprolol tartrate (LOPRESSOR) 25 MG tablet, TAKE 1 TABLET BY MOUTH TWICE A DAY, Disp: 240 tablet, Rfl: 2 .  simvastatin (ZOCOR) 20 MG tablet, Take 1 tablet (20 mg total) by mouth at bedtime., Disp: 120 tablet, Rfl: 3 .  SYMBICORT 160-4.5 MCG/ACT inhaler, INHALE 2 PUFFS INTO THE LUNGS TWICE DAILY *RINSE AFTER USE*, Disp: 10.2 g, Rfl: 3 .  tamsulosin (FLOMAX) 0.4 MG CAPS capsule, TAKE 1 CAPSULE BY MOUTH ONCE  DAILY, Disp: 120 capsule, Rfl: 1   Review of Systems  Constitutional: Positive for malaise/fatigue. Negative for chills and fever.  HENT: Negative for hearing loss.   Eyes: Negative for blurred vision.  Respiratory: Positive for cough, sputum production and shortness of breath. Negative for wheezing.   Cardiovascular: Negative for chest pain.  Gastrointestinal: Negative for heartburn and nausea.  Musculoskeletal: Negative for myalgias.  Skin: Negative for itching and rash.  Neurological: Positive for weakness. Negative for dizziness and headaches.  Endo/Heme/Allergies: Does not bruise/bleed easily.  Psychiatric/Behavioral: Negative for depression.      Allergies:  Tessalon [benzonatate]  Physical Examination:  VS: BP 130/60 (BP Location: Left Arm, Cuff Size: Normal)   Pulse 61   Ht 5' 9.5" (1.765 m)   Wt 98.7 kg (217 lb 9.6 oz)   SpO2 95%   BMI 31.67 kg/m   General Appearance: No distress  HEENT: PERRLA, no ptosis, no other lesions noticed Pulmonary: Coarse upper airway sounds, diaphragmatic excursion normal.No wheezing, No rales   Cardiovascular:  Normal S1,S2.  No m/r/g.     Abdomen:Exam: Benign, Soft, non-tender, No masses  Skin:   warm, no rashes, no ecchymosis  Extremities: normal, no cyanosis, clubbing, warm with normal capillary refill.      Rad results: (The following images and results were reviewed by Dr. Stevenson Clinch on 06/08/16). CXR 07/17/16 EXAM: CHEST  2 VIEW  COMPARISON:  Chest radiograph 02/10/2016 and chest CT 11/07/2015  FINDINGS: The lungs are well inflated. Nodular opacity in the right upper lung is unchanged. Left basilar atelectasis has improved. Aortic atherosclerosis is again seen. Cardiomediastinal contours are otherwise normal. No pneumothorax or sizable pleural effusion. No focal airspace consolidation or pulmonary edema.  IMPRESSION: 1. No focal airspace disease. 2. Aortic atherosclerosis. 3. Unchanged radiographic appearance of right  upper lobe pulmonary nodule.     6MWT- total distance 748 feet, lowest saturation 95%, highest heart rate 65  : 82 year old male with a past medical history of COPD, chronic cough, OSA, presents for follow-up of OSA and COPD.   No problem-specific Assessment & Plan notes found for this encounter.   Updated Medication List Outpatient Encounter Prescriptions as of 11/10/2016  Medication Sig  . albuterol (VENTOLIN HFA) 108 (90 Base) MCG/ACT inhaler Inhale 1-2 puffs into the lungs every 6 (six) hours as needed for wheezing or shortness of breath.  . allopurinol (ZYLOPRIM) 100 MG tablet Take 1 tablet (100 mg total) by mouth daily.  Marland Kitchen aspirin 81 MG EC tablet Take 81 mg by mouth daily.    . chlorpheniramine-HYDROcodone (TUSSIONEX PENNKINETIC ER) 10-8 MG/5ML SUER Take 5 mLs by mouth at bedtime as needed for cough.  . Cholecalciferol 2000 units CAPS Take 2,000 Units by mouth daily at 12 noon.  . citalopram (CELEXA) 40 MG tablet Take 1.5 tablets (  60 mg total) by mouth daily.  . metoprolol tartrate (LOPRESSOR) 25 MG tablet Take 25 mg by mouth 2 (two) times daily.  . metoprolol tartrate (LOPRESSOR) 25 MG tablet TAKE 1 TABLET BY MOUTH TWICE A DAY  . simvastatin (ZOCOR) 20 MG tablet Take 1 tablet (20 mg total) by mouth at bedtime.  . SYMBICORT 160-4.5 MCG/ACT inhaler INHALE 2 PUFFS INTO THE LUNGS TWICE DAILY *RINSE AFTER USE*  . tamsulosin (FLOMAX) 0.4 MG CAPS capsule TAKE 1 CAPSULE BY MOUTH ONCE DAILY   No facility-administered encounter medications on file as of 11/10/2016.     Orders for this visit: No orders of the defined types were placed in this encounter.   Thank  you for the visitation and for allowing  [facility] to assist in the care of your patient. Our recommendations are noted above.  Please contact us if we can be of further service.  Vilinda Boehringer, MD [facility] Office Number: [phone]  Note: This note was prepared with  Dragon dictation along with smaller phrase technology. Any transcriptional errors that result from this process are unintentional.

## 2016-11-10 ENCOUNTER — Encounter: Payer: Self-pay | Admitting: Internal Medicine

## 2016-11-10 ENCOUNTER — Ambulatory Visit (INDEPENDENT_AMBULATORY_CARE_PROVIDER_SITE_OTHER): Payer: Medicare Other | Admitting: Internal Medicine

## 2016-11-10 VITALS — BP 130/60 | HR 61 | Ht 69.5 in | Wt 217.6 lb

## 2016-11-10 DIAGNOSIS — Z9989 Dependence on other enabling machines and devices: Secondary | ICD-10-CM

## 2016-11-10 DIAGNOSIS — R05 Cough: Secondary | ICD-10-CM

## 2016-11-10 DIAGNOSIS — R053 Chronic cough: Secondary | ICD-10-CM

## 2016-11-10 DIAGNOSIS — J449 Chronic obstructive pulmonary disease, unspecified: Secondary | ICD-10-CM | POA: Diagnosis not present

## 2016-11-10 DIAGNOSIS — I2510 Atherosclerotic heart disease of native coronary artery without angina pectoris: Secondary | ICD-10-CM

## 2016-11-10 DIAGNOSIS — G4733 Obstructive sleep apnea (adult) (pediatric): Secondary | ICD-10-CM

## 2016-11-10 NOTE — Patient Instructions (Addendum)
Continue CPAP.  

## 2016-12-01 DIAGNOSIS — R3915 Urgency of urination: Secondary | ICD-10-CM | POA: Diagnosis not present

## 2016-12-01 DIAGNOSIS — N3941 Urge incontinence: Secondary | ICD-10-CM | POA: Diagnosis not present

## 2016-12-11 ENCOUNTER — Other Ambulatory Visit: Payer: Self-pay | Admitting: Family Medicine

## 2016-12-13 MED ORDER — HYDROCOD POLST-CPM POLST ER 10-8 MG/5ML PO SUER: 5.0000 mL | Freq: Every evening | ORAL | 0 refills | Status: DC | PRN

## 2016-12-13 NOTE — Telephone Encounter (Signed)
Printed

## 2016-12-15 MED ORDER — HYDROCOD POLST-CPM POLST ER 10-8 MG/5ML PO SUER: 5.0000 mL | Freq: Every evening | ORAL | 0 refills | Status: DC | PRN

## 2016-12-15 NOTE — Telephone Encounter (Signed)
Patient notified that script is up front and ready for pickup.

## 2016-12-15 NOTE — Telephone Encounter (Signed)
Unable to locate printed prescription.

## 2016-12-15 NOTE — Telephone Encounter (Signed)
I had tried to print that while out of office. It may not have printed.  I printed again.  Thanks.

## 2017-01-25 ENCOUNTER — Other Ambulatory Visit: Payer: Self-pay | Admitting: Family Medicine

## 2017-02-07 DIAGNOSIS — Z86718 Personal history of other venous thrombosis and embolism: Secondary | ICD-10-CM | POA: Insufficient documentation

## 2017-02-07 DIAGNOSIS — Z86711 Personal history of pulmonary embolism: Secondary | ICD-10-CM | POA: Insufficient documentation

## 2017-02-07 NOTE — Progress Notes (Signed)
Cardiology Office Note  Date:  02/08/2017   ID:  Christopher Santiago, DOB [birth year], MRN [id number]  PCP:  Elsie Stain, MD   Chief Complaint  Patient presents with  . other    6 month f/u.  Pt c/o SOBOE and increased weakness over the past year.  Denies chest pain.  Medications reviewed verbally.    HPI:  Christopher Santiago is a  83 year old man with a history of diffuse moderate three-vessel coronary artery disease cardiac catheterization in 2006, PVD of the LE, hypertension, hyperlipidemia, diabetes, PAD, and COPD, smoked for 50 years, obstructive sleep apnea, uses CPAP.  diagnosis in December 2015 with DVT and bilateral PE, started on anticoagulation, also nodule in the lung (benign on biopsy) IVC filter placed, off eliquis, Now on aspirin  He returns today for routine followup of his DVT and PE, shortness of breath, coronary artery disease has increased 1 mm in diameter, currently 4.2 cm in diameter, on CT scan 10/2016  Biggest complaint is chronic fatigue He is not exercising, used to go to the gym Gait instability, legs feel weak, shortness of breath on exertion He reports he has less energy, feels he can only do half of what he used to do Used to lift weights.  Now with chronic cough, sputum off Spiriva Weight down 10 pounds, Eating less, busy taking care of his wife Wife with severe joint issues,shoulders, hips,   Tolerating simvastatin  Has  IVC filter with Dr. Lucky Cowboy, decided to keep in place  EKG on today's visit shows normal sinus rhythm with rate 57 bpm, right bundle branch block, left anterior fascicular block  Other past medical history Previous lower extremity Doppler 11/04/2014 with nearly occlusive thrombus of central portion of the left femoral vein and profunda femoral vein, no DVT on the right CT scan December 13 showed acute large burden of bilateral pulmonary emboli with right heart strain, ascending aortic aneurysm, right upper lobe spiculated cavitary mass  concerning for malignancy PET scan December 17 with hypermetabolic cavity right upper lobe lesion worrisome for malignancy Echocardiogram December 13 with normal ejection fraction otherwise normal study  He was started on anticoagulation, eliquis 5 BID  He smoked for 50 years, stopping over 10 years ago.  Previous lab work,Hemoglobin A1c 5.8, total cholesterol 145, LDL 78  CORONARY ANGIOGRAPHY: In 2006, November  .  The left anterior descending artery: In the proximal LAD there was a tubular 30-40% lesion. In the proximal to mid LAD, right at the takeoff of the second diagonal, there was a 50% tubular lesion. The mid LAD there was a 40% tubular lesion. In the lower branch of the first diagonal there is a 99% lesion with subtotal occlusion of the vessel.  3.  The left circumflex was a large vessel. It gave off a large branching ramus, a large OM-1 and moderate-to-large OM-2 and OM-3. In the mid      circumflex there was a 30% lesion, followed by a 50-60% lesion at the takeoff of the OM-2. Distally there was a 30% lesion. In the upper branch of a large ramus there was a 60-70% lesion. In the OM-1 there was a 50% mid portion, followed by a 70-80% tubular lesion. In the OM-2 there was a 50% proximal lesion.  4.  The right coronary artery was a dominant vessel. There was a 40% proximally, which possibly related to catheter induced spasm -- though it did not change much with nitroglycerin throughout the proximal portion. There were multiple irregularities about  30-40% stenoses. The mid portion, after the takeoff the RV branch, there was a 40% stenosis. Distally, prior to the takeoff of the PDA, there was 40% stenosis. There was a moderate size PDA and 2 small PLs.   PMH:   has a past medical history of Benign prostatic hypertrophy; CAD (coronary artery disease); Cancer [facility]); COPD (chronic obstructive pulmonary disease) ([place]); Depression; Diabetes mellitus; Hyperlipidemia; Hypertension; Hypogonadism  male; Multiple contusions; OSA (obstructive sleep apnea); PE (pulmonary embolism) (10/2014); and Trimalleolar fracture.  PSH:    Past Surgical History:  Procedure Laterality Date  . CARDIAC CATHETERIZATION  Oct 09, 2005   dead spot, two small occlusions , EF 50-55-55% mild -Mod Dz   . HERNIA REPAIR  07/04/01  . LARYNGOSCOPY     with biopsy, right vocal cord lesion   . PROSTATE SURGERY     not full excision  . VENA CAVA FILTER PLACEMENT  2015    Current Outpatient Prescriptions  Medication Sig Dispense Refill  . albuterol (VENTOLIN HFA) 108 (90 Base) MCG/ACT inhaler Inhale 1-2 puffs into the lungs every 6 (six) hours as needed for wheezing or shortness of breath. 18 g 2  . allopurinol (ZYLOPRIM) 100 MG tablet TAKE 1 TABLET BY MOUTH ONCE A DAY 120 tablet 0  . aspirin 81 MG EC tablet Take 81 mg by mouth daily.      . chlorpheniramine-HYDROcodone (TUSSIONEX PENNKINETIC ER) 10-8 MG/5ML SUER Take 5 mLs by mouth at bedtime as needed for cough. 140 mL 0  . Cholecalciferol 2000 units CAPS Take 2,000 Units by mouth daily at 12 noon.    . citalopram (CELEXA) 40 MG tablet Take 1.5 tablets (60 mg total) by mouth daily. 135 tablet 1  . metoprolol tartrate (LOPRESSOR) 25 MG tablet Take 25 mg by mouth 2 (two) times daily.    . SYMBICORT 160-4.5 MCG/ACT inhaler INHALE 2 PUFFS INTO THE LUNGS TWICE DAILY *RINSE AFTER USE* 10.2 g 3  . tamsulosin (FLOMAX) 0.4 MG CAPS capsule TAKE 1 CAPSULE BY MOUTH ONCE DAILY 120 capsule 0  . simvastatin (ZOCOR) 20 MG tablet Take 1 tablet (20 mg total) by mouth at bedtime. (Patient not taking: Reported on 02/08/2017) 120 tablet 3   No current facility-administered medications for this visit.      Allergies:   Tessalon [benzonatate]   Social History:  The patient  reports that he quit smoking about 23 years ago. His smoking use included Cigarettes. He has a 79.50 pack-year smoking history. He has never used smokeless tobacco. He reports that he drinks about 16.8 oz of alcohol  per week . He reports that he does not use drugs.   Family History:   family history includes Diabetes in his mother; Hypertension in his mother.    Review of Systems: Review of Systems  Constitutional: Positive for malaise/fatigue.  Respiratory: Positive for cough and shortness of breath.   Cardiovascular: Negative.   Gastrointestinal: Negative.   Musculoskeletal: Negative.   Neurological: Positive for weakness.  Psychiatric/Behavioral: Negative.   All other systems reviewed and are negative.    PHYSICAL EXAM: VS:  BP 130/80 (BP Location: Left Arm, Patient Position: Sitting, Cuff Size: Normal)   Pulse (!) 56   Ht '5\' 9"'$  (1.753 m)   Wt 205 lb 8 oz (93.2 kg)   BMI 30.35 kg/m  , BMI Body mass index is 30.35 kg/m. GEN: Well nourished, well developed, in no acute distress  HEENT: normal  Neck: no JVD, carotid bruits, or masses Cardiac: RRR; no murmurs, rubs,  or gallops,no edema  Respiratory:  Coarse breath sounds, normal work of breathing GI: soft, nontender, nondistended, + BS MS: no deformity or atrophy  Skin: warm and dry, no rash Neuro:  Strength and sensation are intact Psych: euthymic mood, full affect    Recent Labs: 02/10/2016: ALT 23; Magnesium 2.0; TSH 1.323 02/21/2016: BUN 16; Creatinine, Ser 1.02; Hemoglobin 12.0; Platelets 241.0; Potassium 4.4; Sodium 137    Lipid Panel Lab Results  Component Value Date   CHOL 135 04/11/2015   HDL 59.40 04/11/2015   LDLCALC 63 04/11/2015   TRIG 65.0 04/11/2015      Wt Readings from Last 3 Encounters:  02/08/17 205 lb 8 oz (93.2 kg)  11/10/16 217 lb 9.6 oz (98.7 kg)  09/22/16 214 lb (97.1 kg)       ASSESSMENT AND PLAN:  HYPERCHOLESTEROLEMIA, PURE - Plan: EKG 12-Lead Cholesterol is at goal on the current lipid regimen. No changes to the medications were made.  Essential hypertension - Plan: EKG 12-Lead Blood pressure is well controlled on today's visit. No changes made to the medications.  Atherosclerosis of  native coronary artery of native heart without angina pectoris - Plan: EKG 12-Lead Currently with no symptoms of angina. No further workup at this time. Continue current medication regimen.  Atherosclerosis of native coronary artery of native heart with stable angina pectoris ([place]) - Plan: EKG 12-Lead  PVD (peripheral vascular disease) ([place]) - Plan: EKG 12-Lead On simvastatin  OSA on CPAP - Plan: EKG 12-Lead Compliant on CPAP  Mixed simple and mucopurulent chronic bronchitis ([place]) - Plan: EKG 12-Lead Chronic cough, flem 50 years of smoking  History of DVT (deep vein thrombosis) - Plan: EKG 12-Lead Has IVC filter  History of pulmonary embolism - Plan: EKG 12-Lead  Ascending aortic aneurysm  has increased 1 mm in diameter, currently 4.2 cm in diameter, on CT scan 10/2016   Total encounter time more than 25 minutes  Greater than 50% was spent in counseling and coordination of care with the patient   Disposition:   F/U  6 months   Orders Placed This Encounter  Procedures  . EKG 12-Lead     Signed, Esmond Plants, M.D., Ph.D. 02/08/2017  [place], [place]

## 2017-02-08 ENCOUNTER — Ambulatory Visit (INDEPENDENT_AMBULATORY_CARE_PROVIDER_SITE_OTHER): Payer: Medicare Other | Admitting: Cardiovascular Disease

## 2017-02-08 ENCOUNTER — Encounter: Payer: Self-pay | Admitting: Cardiovascular Disease

## 2017-02-08 VITALS — BP 130/80 | HR 56 | Ht 69.0 in | Wt 205.5 lb

## 2017-02-08 DIAGNOSIS — Z86711 Personal history of pulmonary embolism: Secondary | ICD-10-CM | POA: Diagnosis not present

## 2017-02-08 DIAGNOSIS — J418 Mixed simple and mucopurulent chronic bronchitis: Secondary | ICD-10-CM | POA: Diagnosis not present

## 2017-02-08 DIAGNOSIS — Z86718 Personal history of other venous thrombosis and embolism: Secondary | ICD-10-CM | POA: Diagnosis not present

## 2017-02-08 DIAGNOSIS — I2510 Atherosclerotic heart disease of native coronary artery without angina pectoris: Secondary | ICD-10-CM

## 2017-02-08 DIAGNOSIS — E7800 Pure hypercholesterolemia, unspecified: Secondary | ICD-10-CM

## 2017-02-08 DIAGNOSIS — Z9989 Dependence on other enabling machines and devices: Secondary | ICD-10-CM

## 2017-02-08 DIAGNOSIS — I25118 Atherosclerotic heart disease of native coronary artery with other forms of angina pectoris: Secondary | ICD-10-CM

## 2017-02-08 DIAGNOSIS — G4733 Obstructive sleep apnea (adult) (pediatric): Secondary | ICD-10-CM | POA: Diagnosis not present

## 2017-02-08 DIAGNOSIS — I10 Essential (primary) hypertension: Secondary | ICD-10-CM | POA: Diagnosis not present

## 2017-02-08 DIAGNOSIS — I739 Peripheral vascular disease, unspecified: Secondary | ICD-10-CM

## 2017-02-08 DIAGNOSIS — E782 Mixed hyperlipidemia: Secondary | ICD-10-CM | POA: Diagnosis not present

## 2017-02-08 MED ORDER — SIMVASTATIN 20 MG PO TABS: 20.0000 mg | ORAL_TABLET | Freq: Every day | ORAL | 3 refills | Status: DC

## 2017-02-08 NOTE — Patient Instructions (Addendum)

## 2017-03-13 ENCOUNTER — Other Ambulatory Visit: Payer: Self-pay | Admitting: Family Medicine

## 2017-04-13 ENCOUNTER — Ambulatory Visit: Payer: Medicare Other | Admitting: Cardiovascular Disease

## 2017-04-28 ENCOUNTER — Other Ambulatory Visit: Payer: Self-pay | Admitting: Family Medicine

## 2017-04-28 MED ORDER — HYDROCOD POLST-CPM POLST ER 10-8 MG/5ML PO SUER: 5.0000 mL | Freq: Every evening | ORAL | 0 refills | Status: DC | PRN

## 2017-04-28 NOTE — Telephone Encounter (Signed)
Left detailed message on voicemail (DPR) as instructed. Advised patient that script is up front ready for pickup.

## 2017-04-28 NOTE — Telephone Encounter (Signed)
Last refill 12/15/16 140 ml Last office visit 08/07/16 See allergy/contraindication

## 2017-04-28 NOTE — Telephone Encounter (Signed)
Printed.  Thanks.  Has used episodically.  If needed frequently then let me know.

## 2017-04-30 ENCOUNTER — Encounter: Payer: Self-pay | Admitting: Family Medicine

## 2017-05-09 NOTE — Progress Notes (Signed)
Christopher Santiago Pulmonary Medicine Consultation      MRN# [id number] Christopher Santiago 1930-11-29  Assessment and Plan  COPD with bullous emphysema seen on CT scanning.  --Continue symbicort, albuterol.  --Asked to use ventolin before doing something that will make him winded.   Obstructive sleep apnea  --Continue using CPAP every night.  --Reminded him to try to put the CPAP back on whenever he takes it off.   Bronchiectasis.  --Seen on CT scanning, he has excess mucus production in the am, but other than that does well.   Lung nodule.  --Stable lung nodules/scarring for 1 year.  --Repeat Ct scan in December of 2018 to complete surveillance. Will order at next visit.   History of Pulmonary embolism.  -- No longer on eliquis, s/p filter in right upper extremity.   Chronic Rhinitis.  --Has been present for several years, not better with nasal steroids.  --Will start nasal ipratropium.    CC: Chief Complaint  Patient presents with  . Sleep Apnea    pt wears cpap      Brief History: [name] first saw the [facility] clinic in 05/2013 for COPD and cough. He had a lot of post nasal drip. PFT's showed moderate obstruction in 04/2013 (Ratio 47%, FEV1 1.73L, 54% predicted), noted to have a sleep study, on CPAP of 10. Hx of RUL cavitary lesion 10/2015, getting smaller.   Patient presents today for follow-up visit, he has a history of COPD, obstructive sleep apnea, lung nodule.He took spiriva for a few years but did not notice any difference and it was too expensive.  He has already completed pulmonary rehab. He notes that he continues to have dyspnea with moderate dyspnea of walking a few hundred feet, such as taking the garbage cans to the end of the street, or yard work.  He has continued on symbicort 2 puffs bid, he uses ventolin as needed. When he takes it he feels that it helps.   He has been using cpap every night, he recently got a new machine and appears to be  doing well with it. His compliance could be better, he takes it off to urinate but often does not put it back on.   Download data: 5/20-6/18/18; uses greater than 4 hours equals 43%. Used 27/30 days. Average usage is 4 hours 31 minutes. CPAP set at 10 cm H2O. Residual AHI equals 5.2  Ct chest  11/06/16 On my personal review there is scarring/nodule in the RUL, which is unchanged. Mild bullous emphysema, bronchiectasis. Per report there is no significant change since 11/07/2015.  IMPRESSION: 1. Stable bilateral scarring. Stable right upper lobe nodules, 1 of which may be faintly calcified but the other of which is very densely calcified. The appearance is compatible with benign process. 2. Emphysema. 3. Old granulomatous disease.    Medication:     Current Outpatient Prescriptions:  .  albuterol (VENTOLIN HFA) 108 (90 Base) MCG/ACT inhaler, Inhale 1-2 puffs into the lungs every 6 (six) hours as needed for wheezing or shortness of breath., Disp: 18 g, Rfl: 2 .  allopurinol (ZYLOPRIM) 100 MG tablet, TAKE 1 TABLET BY MOUTH ONCE A DAY, Disp: 120 tablet, Rfl: 0 .  aspirin 81 MG EC tablet, Take 81 mg by mouth daily.  , Disp: , Rfl:  .  chlorpheniramine-HYDROcodone (TUSSIONEX PENNKINETIC ER) 10-8 MG/5ML SUER, Take 5 mLs by mouth at bedtime as needed for cough., Disp: 140 mL, Rfl: 0 .  Cholecalciferol 2000 units CAPS,  Take 2,000 Units by mouth daily at 12 noon., Disp: , Rfl:  .  citalopram (CELEXA) 40 MG tablet, Take 1.5 tablets (60 mg total) by mouth daily., Disp: 135 tablet, Rfl: 1 .  metoprolol tartrate (LOPRESSOR) 25 MG tablet, Take 25 mg by mouth 2 (two) times daily., Disp: , Rfl:  .  simvastatin (ZOCOR) 20 MG tablet, Take 1 tablet (20 mg total) by mouth at bedtime., Disp: 90 tablet, Rfl: 3 .  SYMBICORT 160-4.5 MCG/ACT inhaler, INHALE 2 PUFFS INTO THE LUNGS TWICE DAILY *RINSE AFTER USE*, Disp: 10.2 g, Rfl: 5 .  tamsulosin (FLOMAX) 0.4 MG CAPS capsule, TAKE 1 CAPSULE BY MOUTH ONCE DAILY,  Disp: 120 capsule, Rfl: 0   Review of Systems  Constitutional: Negative for chills, fever and malaise/fatigue.  HENT: Negative for hearing loss.   Eyes: Negative for blurred vision.  Respiratory: Positive for sputum production and shortness of breath. Negative for cough and wheezing.   Cardiovascular: Negative for chest pain.  Gastrointestinal: Negative for heartburn and nausea.  Musculoskeletal: Negative for myalgias.  Skin: Negative for itching and rash.  Neurological: Positive for weakness. Negative for dizziness and headaches.  Endo/Heme/Allergies: Does not bruise/bleed easily.  Psychiatric/Behavioral: Negative for depression.      Allergies:  Tessalon [benzonatate]  Physical Examination:  VS: BP (!) 138/58 (BP Location: Left Arm, Cuff Size: Normal)   Pulse 66   SpO2 95%   General Appearance: No distress  HEENT: PERRLA, no ptosis, no other lesions noticed Pulmonary: Coarse upper airway sounds, diaphragmatic excursion normal.No wheezing, No rales   Cardiovascular:  Normal S1,S2.  No m/r/g.     Abdomen:Exam: Benign, Soft, non-tender, No masses  Skin:   warm, no rashes, no ecchymosis  Extremities: normal, no cyanosis, clubbing, warm with normal capillary refill.      Rad results: (The following images and results were reviewed by Dr. Stevenson Clinch on 06/08/16). CXR 07/17/16 EXAM: CHEST  2 VIEW  COMPARISON:  Chest radiograph 02/10/2016 and chest CT 11/07/2015  FINDINGS: The lungs are well inflated. Nodular opacity in the right upper lung is unchanged. Left basilar atelectasis has improved. Aortic atherosclerosis is again seen. Cardiomediastinal contours are otherwise normal. No pneumothorax or sizable pleural effusion. No focal airspace consolidation or pulmonary edema.  IMPRESSION: 1. No focal airspace disease. 2. Aortic atherosclerosis. 3. Unchanged radiographic appearance of right upper lobe pulmonary nodule.     6MWT- total distance 748 feet, lowest saturation  95%, highest heart rate 26  : 83 year old male with a past medical history of COPD, chronic cough, OSA, presents for follow-up of OSA and COPD.   No problem-specific Assessment & Plan notes found for this encounter.   Updated Medication List Outpatient Encounter Prescriptions as of 05/12/2017  Medication Sig  . albuterol (VENTOLIN HFA) 108 (90 Base) MCG/ACT inhaler Inhale 1-2 puffs into the lungs every 6 (six) hours as needed for wheezing or shortness of breath.  . allopurinol (ZYLOPRIM) 100 MG tablet TAKE 1 TABLET BY MOUTH ONCE A DAY  . aspirin 81 MG EC tablet Take 81 mg by mouth daily.    . chlorpheniramine-HYDROcodone (TUSSIONEX PENNKINETIC ER) 10-8 MG/5ML SUER Take 5 mLs by mouth at bedtime as needed for cough.  . Cholecalciferol 2000 units CAPS Take 2,000 Units by mouth daily at 12 noon.  . citalopram (CELEXA) 40 MG tablet Take 1.5 tablets (60 mg total) by mouth daily.  . metoprolol tartrate (LOPRESSOR) 25 MG tablet Take 25 mg by mouth 2 (two) times daily.  . simvastatin (ZOCOR) 20  MG tablet Take 1 tablet (20 mg total) by mouth at bedtime.  . SYMBICORT 160-4.5 MCG/ACT inhaler INHALE 2 PUFFS INTO THE LUNGS TWICE DAILY *RINSE AFTER USE*  . tamsulosin (FLOMAX) 0.4 MG CAPS capsule TAKE 1 CAPSULE BY MOUTH ONCE DAILY   No facility-administered encounter medications on file as of 05/12/2017.     Orders for this visit: No orders of the defined types were placed in this encounter.   Thank  you for the visitation and for allowing  [facility] to assist in the care of your patient. Our recommendations are noted above.  Please contact us if we can be of further service.  Marda Stalker M.D. [facility] Office Number: [phone]

## 2017-05-10 ENCOUNTER — Encounter: Payer: Self-pay | Admitting: Internal Medicine

## 2017-05-12 ENCOUNTER — Encounter: Payer: Self-pay | Admitting: Internal Medicine

## 2017-05-12 ENCOUNTER — Ambulatory Visit (INDEPENDENT_AMBULATORY_CARE_PROVIDER_SITE_OTHER): Payer: Medicare Other | Admitting: Internal Medicine

## 2017-05-12 VITALS — BP 138/58 | HR 66 | Resp 16 | Ht 67.0 in | Wt 206.0 lb

## 2017-05-12 DIAGNOSIS — I2510 Atherosclerotic heart disease of native coronary artery without angina pectoris: Secondary | ICD-10-CM

## 2017-05-12 DIAGNOSIS — Z9989 Dependence on other enabling machines and devices: Secondary | ICD-10-CM

## 2017-05-12 DIAGNOSIS — J449 Chronic obstructive pulmonary disease, unspecified: Secondary | ICD-10-CM

## 2017-05-12 DIAGNOSIS — G4733 Obstructive sleep apnea (adult) (pediatric): Secondary | ICD-10-CM

## 2017-05-12 MED ORDER — IPRATROPIUM BROMIDE 0.06 % NA SOLN: 2.0000 | Freq: Three times a day (TID) | NASAL | 1 refills | Status: DC

## 2017-05-12 NOTE — Patient Instructions (Addendum)
Use ventolin before doing something that will make you winded.   Put the mask back on after taking it off.   Start nasal spray, 2 sprays in each nostril three times daily.

## 2017-05-21 ENCOUNTER — Other Ambulatory Visit: Payer: Self-pay | Admitting: Family Medicine

## 2017-06-04 ENCOUNTER — Other Ambulatory Visit: Payer: Self-pay | Admitting: Family Medicine

## 2017-06-04 NOTE — Telephone Encounter (Signed)
MyChart refill request.  Tussionex Last office visit:   08/07/16 Last Filled:    140 mL 0 04/28/2017  Please advise.

## 2017-06-06 MED ORDER — HYDROCOD POLST-CPM POLST ER 10-8 MG/5ML PO SUER: 5.0000 mL | Freq: Every evening | ORAL | 0 refills | Status: DC | PRN

## 2017-06-06 NOTE — Telephone Encounter (Signed)
Printed.  Thanks.  

## 2017-06-07 NOTE — Telephone Encounter (Signed)
Patient advised.  Rx left at front desk for pick up. 

## 2017-06-18 ENCOUNTER — Other Ambulatory Visit: Payer: Self-pay | Admitting: Family Medicine

## 2017-06-18 NOTE — Telephone Encounter (Signed)
Electronic refill request. Allopurinol Last office visit:   08/07/16   No upcoming appts scheduled. Last Filled:    120 tablet 0 01/25/2017  Please advise.

## 2017-06-20 NOTE — Telephone Encounter (Signed)
Sent.  Thanks.  Due for CPE/AMW visit this fall.

## 2017-06-21 NOTE — Telephone Encounter (Signed)
Left detailed message on voicemail.  

## 2017-08-10 ENCOUNTER — Ambulatory Visit (INDEPENDENT_AMBULATORY_CARE_PROVIDER_SITE_OTHER): Payer: Medicare Other | Admitting: Family Medicine

## 2017-08-10 ENCOUNTER — Encounter: Payer: Self-pay | Admitting: Family Medicine

## 2017-08-10 ENCOUNTER — Telehealth: Payer: Self-pay | Admitting: *Deleted

## 2017-08-10 VITALS — BP 166/60 | HR 51 | Temp 98.5°F | Ht 70.0 in | Wt 208.2 lb

## 2017-08-10 DIAGNOSIS — Z7289 Other problems related to lifestyle: Secondary | ICD-10-CM

## 2017-08-10 DIAGNOSIS — F329 Major depressive disorder, single episode, unspecified: Secondary | ICD-10-CM

## 2017-08-10 DIAGNOSIS — Z7189 Other specified counseling: Secondary | ICD-10-CM

## 2017-08-10 DIAGNOSIS — E782 Mixed hyperlipidemia: Secondary | ICD-10-CM | POA: Diagnosis not present

## 2017-08-10 DIAGNOSIS — E790 Hyperuricemia without signs of inflammatory arthritis and tophaceous disease: Secondary | ICD-10-CM

## 2017-08-10 DIAGNOSIS — R05 Cough: Secondary | ICD-10-CM | POA: Diagnosis not present

## 2017-08-10 DIAGNOSIS — M25551 Pain in right hip: Secondary | ICD-10-CM

## 2017-08-10 DIAGNOSIS — I10 Essential (primary) hypertension: Secondary | ICD-10-CM | POA: Diagnosis not present

## 2017-08-10 DIAGNOSIS — D649 Anemia, unspecified: Secondary | ICD-10-CM

## 2017-08-10 DIAGNOSIS — I2510 Atherosclerotic heart disease of native coronary artery without angina pectoris: Secondary | ICD-10-CM

## 2017-08-10 DIAGNOSIS — R059 Cough, unspecified: Secondary | ICD-10-CM

## 2017-08-10 DIAGNOSIS — Z0000 Encounter for general adult medical examination without abnormal findings: Secondary | ICD-10-CM | POA: Diagnosis not present

## 2017-08-10 DIAGNOSIS — J418 Mixed simple and mucopurulent chronic bronchitis: Secondary | ICD-10-CM

## 2017-08-10 DIAGNOSIS — R739 Hyperglycemia, unspecified: Secondary | ICD-10-CM | POA: Diagnosis not present

## 2017-08-10 DIAGNOSIS — Z789 Other specified health status: Secondary | ICD-10-CM

## 2017-08-10 DIAGNOSIS — F32A Depression, unspecified: Secondary | ICD-10-CM

## 2017-08-10 LAB — CBC WITH DIFFERENTIAL/PLATELET
Basophils Absolute: 0.1 10*3/uL (ref 0.0–0.1)
Basophils Relative: 0.9 % (ref 0.0–3.0)
EOS ABS: 0.3 10*3/uL (ref 0.0–0.7)
Eosinophils Relative: 4.7 % (ref 0.0–5.0)
HCT: 36.9 % — ABNORMAL LOW (ref 39.0–52.0)
HEMOGLOBIN: 12 g/dL — AB (ref 13.0–17.0)
Lymphocytes Relative: 20.1 % (ref 12.0–46.0)
Lymphs Abs: 1.4 10*3/uL (ref 0.7–4.0)
MCHC: 32.6 g/dL (ref 30.0–36.0)
MCV: 97.2 fl (ref 78.0–100.0)
MONO ABS: 0.6 10*3/uL (ref 0.1–1.0)
Monocytes Relative: 8.1 % (ref 3.0–12.0)
Neutro Abs: 4.6 10*3/uL (ref 1.4–7.7)
Neutrophils Relative %: 66.2 % (ref 43.0–77.0)
Platelets: 150 10*3/uL (ref 150.0–400.0)
RBC: 3.8 Mil/uL — ABNORMAL LOW (ref 4.22–5.81)
RDW: 13.5 % (ref 11.5–15.5)
WBC: 7 10*3/uL (ref 4.0–10.5)

## 2017-08-10 LAB — LIPID PANEL
Cholesterol: 127 mg/dL (ref 0–200)
HDL: 76.9 mg/dL (ref >39.00)
LDL CALC: 33 mg/dL (ref 0–99)
NonHDL: 50.27
TRIGLYCERIDES: 84 mg/dL (ref 0.0–149.0)
Total CHOL/HDL Ratio: 2
VLDL: 16.8 mg/dL (ref 0.0–40.0)

## 2017-08-10 LAB — IBC PANEL
IRON: 76 ug/dL (ref 42–165)
Saturation Ratios: 25.9 % (ref 20.0–50.0)
Transferrin: 210 mg/dL — ABNORMAL LOW (ref 212.0–360.0)

## 2017-08-10 LAB — COMPREHENSIVE METABOLIC PANEL
ALBUMIN: 3.7 g/dL (ref 3.5–5.2)
ALT: 13 U/L (ref 0–53)
AST: 21 U/L (ref 0–37)
Alkaline Phosphatase: 42 U/L (ref 39–117)
BUN: 29 mg/dL — ABNORMAL HIGH (ref 6–23)
CHLORIDE: 103 meq/L (ref 96–112)
CO2: 29 mEq/L (ref 19–32)
CREATININE: 1.12 mg/dL (ref 0.40–1.50)
Calcium: 9.3 mg/dL (ref 8.4–10.5)
GFR: 66.31 mL/min (ref >60.00)
GLUCOSE: 112 mg/dL — AB (ref 70–99)
Potassium: 5.3 mEq/L — ABNORMAL HIGH (ref 3.5–5.1)
SODIUM: 136 meq/L (ref 135–145)
TOTAL PROTEIN: 6.9 g/dL (ref 6.0–8.3)
Total Bilirubin: 0.6 mg/dL (ref 0.2–1.2)

## 2017-08-10 LAB — URIC ACID: URIC ACID, SERUM: 5.7 mg/dL (ref 4.0–7.8)

## 2017-08-10 LAB — HEMOGLOBIN A1C: Hgb A1c MFr Bld: 5.9 % (ref 4.6–6.5)

## 2017-08-10 MED ORDER — ALBUTEROL SULFATE HFA 108 (90 BASE) MCG/ACT IN AERS: 1.0000 | INHALATION_SPRAY | Freq: Four times a day (QID) | RESPIRATORY_TRACT | 5 refills | Status: DC | PRN

## 2017-08-10 MED ORDER — BUDESONIDE-FORMOTEROL FUMARATE 160-4.5 MCG/ACT IN AERO: INHALATION_SPRAY | RESPIRATORY_TRACT | 12 refills | Status: DC

## 2017-08-10 MED ORDER — HYDROCOD POLST-CPM POLST ER 10-8 MG/5ML PO SUER: 5.0000 mL | Freq: Every evening | ORAL | 0 refills | Status: DC | PRN

## 2017-08-10 MED ORDER — METOPROLOL TARTRATE 25 MG PO TABS: 25.0000 mg | ORAL_TABLET | Freq: Two times a day (BID) | ORAL | 3 refills | Status: DC

## 2017-08-10 MED ORDER — ALLOPURINOL 100 MG PO TABS: 100.0000 mg | ORAL_TABLET | Freq: Every day | ORAL | 3 refills | Status: DC

## 2017-08-10 MED ORDER — CITALOPRAM HYDROBROMIDE 40 MG PO TABS: 60.0000 mg | ORAL_TABLET | Freq: Every day | ORAL | 3 refills | Status: DC

## 2017-08-10 MED ORDER — TAMSULOSIN HCL 0.4 MG PO CAPS: 0.4000 mg | ORAL_CAPSULE | Freq: Every day | ORAL | 3 refills | Status: DC

## 2017-08-10 MED ORDER — INDOMETHACIN 50 MG PO CAPS: 50.0000 mg | ORAL_CAPSULE | Freq: Two times a day (BID) | ORAL | 1 refills | Status: DC | PRN

## 2017-08-10 NOTE — Telephone Encounter (Signed)
-----   Message from Laverle Hobby, MD sent at 08/10/2017  3:19 PM EDT ----- Bufford Lope or Davy Pique, can you please add him in with me for next available? Thanks.  ----- Message ----- From: Tonia Ghent, MD Sent: 08/10/2017  10:28 AM To: Laverle Hobby, MD  Can you please get him back in re: his cough?  Many thanks.    Brigitte Pulse

## 2017-08-10 NOTE — Patient Instructions (Signed)
I sent a note to pulmonary about getting you back in to talk about the cough.  Go to the lab on the way out.  We'll contact you with your lab report. Take care.  Glad to see you. Update me as needed.  Let me know if you need help getting an appointment with urology/if you want to go back to see them.

## 2017-08-10 NOTE — Progress Notes (Signed)
I have personally reviewed the Medicare Annual Wellness questionnaire and have noted 1. The patient's medical and social history 2. Their use of alcohol, tobacco or illicit drugs 3. Their current medications and supplements 4. The patient's functional ability including ADL's, fall risks, home safety risks and hearing or visual             impairment. 5. Diet and physical activities 6. Evidence for depression or mood disorders  The patients weight, height, BMI have been recorded in the chart and visual acuity is per eye clinic.  I have made referrals, counseling and provided education to the patient based review of the above and I have provided the pt with a written personalized care plan for preventive services.  Provider list updated- see scanned forms.  Routine anticipatory guidance given to patient.  See health maintenance. The possibility exists that previously documented standard health maintenance information may have been brought forward from a previous encounter into this note.  If needed, that same information has been updated to reflect the current situation based on today's encounter.    Flu prev done per patient.  ~07/07/17 at pharmacy.  D/w pt.  Shingles prev done PNA UTD Tetanus 2014 Colonoscopy NA due to age.  Prostate cancer screening declined due to age.  D/w pt.   Advance directive- wife designated if patient were incapacitated.   Cognitive function addressed- see scanned forms- and if abnormal then additional documentation follows.  See AVS.   He failed tx with mybetriq.  D/w pt about possible urology f/u.  He'll consider.    Hypertension:    Using medication without problems or lightheadedness:  Yes.  Chest pain with exertion:no Edema:no Short of breath: likely from COPD. See below.   Usually 130/60-80s usually on home check.   Labs pending.    No recent gout flares, still on allopurinol.    Elevated Cholesterol: Using medications without problems:yes Muscle  aches: no Diet compliance:  encouraged Exercise:limited by COPD.    His wife is inpatient currently with more surgery pending.  She had mult ortho surgeries in the meantime.  He has been looking after everything else in the meantime.  This is a strain for both of them.  His mood was stable on the SSRI as is and wanted to continue.   COPD.  Had been using his inhalers at baseline with occ dose of cough syrup.  Still on BID symbicort.  Using SABA prn.  He continues with cough, worse with talking.  Nasal atrovent didn't help.   He is having more R sided hip pain.  More pain by the end of the day.  advil didn't help much.  Indomethacin helps a lot more.  Taking 1 tab a day usually, with occ BID dosing, with relief.  nsaid caution d/w pt.    PMH and SH reviewed  Meds, vitals, and allergies reviewed.   ROS: Per HPI.  Unless specifically indicated otherwise in HPI, the patient denies:  General: fever. Eyes: acute vision changes ENT: sore throat Cardiovascular: chest pain Respiratory: SOB GI: vomiting GU: dysuria Musculoskeletal: acute back pain Derm: acute rash Neuro: acute motor dysfunction Psych: worsening mood Endocrine: polydipsia Heme: bleeding Allergy: hayfever  GEN: nad, alert and oriented HEENT: mucous membranes moist, mild rhinorrhea noted-clear NECK: supple w/o LA CV: rrr. PULM: ctab, no inc wob, dry cough noted.  ABD: soft, +bs EXT: no edema SKIN: no acute rash but senile purpura noted on the BUE mainly on the extensor side.  Walking  with limp from R hip pain.

## 2017-08-10 NOTE — Telephone Encounter (Signed)
LMCTB and schedule appointment for cough.

## 2017-08-12 DIAGNOSIS — M25551 Pain in right hip: Secondary | ICD-10-CM | POA: Insufficient documentation

## 2017-08-12 NOTE — Assessment & Plan Note (Signed)
Flu prev done per patient.  ~07/07/17 at pharmacy.  D/w pt.  Shingles prev done PNA UTD Tetanus 2014 Colonoscopy NA due to age.  Prostate cancer screening declined due to age.  D/w pt.   Advance directive- wife designated if patient were incapacitated.   Cognitive function addressed- see scanned forms- and if abnormal then additional documentation follows.  See AVS.

## 2017-08-12 NOTE — Assessment & Plan Note (Signed)
Reasonable to continue indomethacin with routine cautions and update me if med isn't effective.  D/w pt.  He agrees.

## 2017-08-12 NOTE — Assessment & Plan Note (Signed)
Advance directive- wife designated if patient were incapacitated.  

## 2017-08-12 NOTE — Assessment & Plan Note (Signed)
His wife is inpatient currently with more surgery pending.  She had mult ortho surgeries in the meantime.  He has been looking after everything else in the meantime.  This is a strain for both of them.  His mood was stable on the SSRI as is and wanted to continue.

## 2017-08-12 NOTE — Assessment & Plan Note (Signed)
Exercise limited by COPD.  Continue statin.

## 2017-08-12 NOTE — Assessment & Plan Note (Signed)
Usually 130/60-80s usually on home check.  No change in meds.  See notes on labs.

## 2017-08-12 NOTE — Assessment & Plan Note (Signed)
Per patient, use but not abuse, has ~2 drinks per day for most of his adult life.

## 2017-08-12 NOTE — Assessment & Plan Note (Signed)
Will ask for Pulmonary input.  No change in meds.

## 2017-08-13 ENCOUNTER — Encounter: Payer: Self-pay | Admitting: *Deleted

## 2017-08-23 ENCOUNTER — Encounter: Payer: Self-pay | Admitting: Internal Medicine

## 2017-08-23 ENCOUNTER — Ambulatory Visit (INDEPENDENT_AMBULATORY_CARE_PROVIDER_SITE_OTHER): Payer: Medicare Other | Admitting: Internal Medicine

## 2017-08-23 VITALS — BP 146/78 | HR 64 | Resp 16 | Ht 68.0 in | Wt 210.0 lb

## 2017-08-23 DIAGNOSIS — I2510 Atherosclerotic heart disease of native coronary artery without angina pectoris: Secondary | ICD-10-CM

## 2017-08-23 DIAGNOSIS — J449 Chronic obstructive pulmonary disease, unspecified: Secondary | ICD-10-CM

## 2017-08-23 DIAGNOSIS — R918 Other nonspecific abnormal finding of lung field: Secondary | ICD-10-CM

## 2017-08-23 MED ORDER — IPRATROPIUM-ALBUTEROL 0.5-2.5 (3) MG/3ML IN SOLN: 3.0000 mL | Freq: Three times a day (TID) | RESPIRATORY_TRACT | 5 refills | Status: DC

## 2017-08-23 NOTE — Progress Notes (Signed)
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  --Continue symbicort, albuterol.  --Asked to use ventolin before doing something that will make him winded.   Obstructive sleep apnea  --Continue using CPAP every night.  --Reminded him to try to put the CPAP back on whenever he takes it off.   Bronchiectasis.  --Seen on CT scanning, he has excess mucus production in the am, but other than that does well.   Lung nodule.  --Stable lung nodules/scarring for 1 year.  --Repeat Ct scan in December of 2018 to complete surveillance. Will order at next visit.   History of Pulmonary embolism.  -- No longer on eliquis, s/p filter in right upper extremity.   Chronic Rhinitis.  --Has been present for several years, not better with nasal steroids.  --Will start nasal ipratropium.   Orders Placed This Encounter  Procedures  . DME Nebulizer machine  . CT CHEST WO CONTRAST   Meds ordered this encounter  Medications  . DISCONTD: Cholecalciferol (VITAMIN D) 2000 units CAPS    Sig: Take 1 capsule by mouth daily.  Marland Kitchen ipratropium-albuterol (DUONEB) 0.5-2.5 (3) MG/3ML SOLN    Sig: Take 3 mLs by nebulization 3 (three) times daily.    Dispense:  360 mL    Refill:  5     CC: Chief Complaint  Patient presents with  . COPD  . Cough    mostly at nighttime patient has drainage.  . Shortness of Breath    patient is having with exhertion      Brief History: Christopher Santiago first saw the [facility] clinic in 05/2013 for COPD and cough. He had a lot of post nasal drip. PFT's showed moderate obstruction in 04/2013 (Ratio 47%, FEV1 1.73L, 54% predicted), noted to have a sleep study, on CPAP of 10. Hx of RUL cavitary lesion 10/2015, getting smaller.   Patient presents today for follow-up visit, he has a history of COPD, obstructive sleep apnea, lung nodule.He took spiriva for  a few years but did not notice any difference and it was too expensive. He notes today that he has a persistent cough with sinus drainage, he has been on nasal steroids in the past with no improvement, so at last visit was tried on nasal ipratropium. He is using symbicort 2 puffs twice daily, ventolin 2-3 per day. He has trouble clearing mucus from his throat. He does not use a nebulizer.  He has been not using his CPAP machine because he does not feel that it does anything for him.    He has already completed pulmonary rehab. He notes that he continues to have dyspnea with moderate dyspnea of walking a few hundred feet, such as taking the garbage cans to the end of the street, or yard work.  He has continued on symbicort 2 puffs bid, he uses ventolin as needed. When he takes it he feels that it helps.   He has been using cpap every night, he recently got a new machine and appears to be doing well with it. His compliance could be better, he takes it off to urinate but often does not put it back on.   Download data: **9/1-9/30/18; uses greater than 4 hours equals 0%. Used 11/30 days. Resume usage on days used is 2 hours 18 minutes. CPAP set at 10 cm of H2O. Residual AHI equals 7.7. **5/20-6/18/18; uses  greater than 4 hours equals 43%. Used 27/30 days. Average usage is 4 hours 31 minutes. CPAP set at 10 cm H2O. Residual AHI equals 5.2  Ct chest  11/06/16 On my personal review there is scarring/nodule in the RUL, which is unchanged. Mild bullous emphysema, bronchiectasis. Per report there is no significant change since 11/07/2015.  IMPRESSION: 1. Stable bilateral scarring. Stable right upper lobe nodules, 1 of which may be faintly calcified but the other of which is very densely calcified. The appearance is compatible with benign process. 2. Emphysema. 3. Old granulomatous disease.    Medication:     Current Outpatient Prescriptions:  .  albuterol (VENTOLIN HFA) 108 (90 Base) MCG/ACT  inhaler, Inhale 1-2 puffs into the lungs every 6 (six) hours as needed for wheezing or shortness of breath., Disp: 18 g, Rfl: 5 .  allopurinol (ZYLOPRIM) 100 MG tablet, Take 1 tablet (100 mg total) by mouth daily., Disp: 90 tablet, Rfl: 3 .  aspirin 81 MG EC tablet, Take 81 mg by mouth daily.  , Disp: , Rfl:  .  budesonide-formoterol (SYMBICORT) 160-4.5 MCG/ACT inhaler, INHALE 2 PUFFS INTO THE LUNGS TWICE DAILY *RINSE AFTER USE*, Disp: 10.2 g, Rfl: 12 .  chlorpheniramine-HYDROcodone (TUSSIONEX PENNKINETIC ER) 10-8 MG/5ML SUER, Take 5 mLs by mouth at bedtime as needed for cough., Disp: 140 mL, Rfl: 0 .  Cholecalciferol 2000 units CAPS, Take 2,000 Units by mouth daily at 12 noon., Disp: , Rfl:  .  citalopram (CELEXA) 40 MG tablet, Take 1.5 tablets (60 mg total) by mouth daily., Disp: 135 tablet, Rfl: 3 .  indomethacin (INDOCIN) 50 MG capsule, Take 1 capsule (50 mg total) by mouth 2 (two) times daily as needed (with food)., Disp: 180 capsule, Rfl: 1 .  ipratropium (ATROVENT) 0.06 % nasal spray, Place 2 sprays into the nose 3 (three) times daily., Disp: 15 mL, Rfl: 1 .  metoprolol tartrate (LOPRESSOR) 25 MG tablet, Take 1 tablet (25 mg total) by mouth 2 (two) times daily., Disp: 180 tablet, Rfl: 3 .  simvastatin (ZOCOR) 20 MG tablet, Take 1 tablet (20 mg total) by mouth at bedtime., Disp: 90 tablet, Rfl: 3 .  tamsulosin (FLOMAX) 0.4 MG CAPS capsule, Take 1 capsule (0.4 mg total) by mouth daily., Disp: 90 capsule, Rfl: 3   Review of Systems  Constitutional: Negative for chills, fever and malaise/fatigue.  HENT: Negative for hearing loss.   Eyes: Negative for blurred vision.  Respiratory: Positive for sputum production and shortness of breath. Negative for cough and wheezing.   Cardiovascular: Negative for chest pain.  Gastrointestinal: Negative for heartburn and nausea.  Musculoskeletal: Negative for myalgias.  Skin: Negative for itching and rash.  Neurological: Positive for weakness. Negative for  dizziness and headaches.  Endo/Heme/Allergies: Does not bruise/bleed easily.  Psychiatric/Behavioral: Negative for depression.      Allergies:  Tessalon [benzonatate]  Physical Examination:  VS: BP (!) 146/78 (BP Location: Left Arm, Cuff Size: Normal)   Pulse 64   Resp 16   Ht 5\' 8"  (1.727 m)   Wt 210 lb (95.3 kg)   SpO2 96%   BMI 31.93 kg/m   General Appearance: No distress  HEENT: PERRLA, no ptosis, no other lesions noticed Pulmonary: Coarse upper airway sounds, diaphragmatic excursion normal.No wheezing, No rales   Cardiovascular:  Normal S1,S2.  No m/r/g.     Abdomen:Exam: Benign, Soft, non-tender, No masses  Skin:   warm, no rashes, no ecchymosis  Extremities: normal, no cyanosis, clubbing, warm with normal capillary refill.  Rad results: (The following images and results were reviewed by Dr. Stevenson Clinch on 06/08/16). CXR 07/17/16 EXAM: CHEST  2 VIEW  COMPARISON:  Chest radiograph 02/10/2016 and chest CT 11/07/2015  FINDINGS: The lungs are well inflated. Nodular opacity in the right upper lung is unchanged. Left basilar atelectasis has improved. Aortic atherosclerosis is again seen. Cardiomediastinal contours are otherwise normal. No pneumothorax or sizable pleural effusion. No focal airspace consolidation or pulmonary edema.  IMPRESSION: 1. No focal airspace disease. 2. Aortic atherosclerosis. 3. Unchanged radiographic appearance of right upper lobe pulmonary nodule.     6MWT- total distance 748 feet, lowest saturation 95%, highest heart rate 54  : 83 year old male with a past medical history of COPD, chronic cough, OSA, presents for follow-up of OSA and COPD.     Updated Medication List Outpatient Encounter Prescriptions as of 08/23/2017  Medication Sig  . albuterol (VENTOLIN HFA) 108 (90 Base) MCG/ACT inhaler Inhale 1-2 puffs into the lungs every 6 (six) hours as needed for wheezing or shortness of breath.  . allopurinol (ZYLOPRIM) 100 MG tablet  Take 1 tablet (100 mg total) by mouth daily.  Marland Kitchen aspirin 81 MG EC tablet Take 81 mg by mouth daily.    . budesonide-formoterol (SYMBICORT) 160-4.5 MCG/ACT inhaler INHALE 2 PUFFS INTO THE LUNGS TWICE DAILY *RINSE AFTER USE*  . chlorpheniramine-HYDROcodone (TUSSIONEX PENNKINETIC ER) 10-8 MG/5ML SUER Take 5 mLs by mouth at bedtime as needed for cough.  . Cholecalciferol 2000 units CAPS Take 2,000 Units by mouth daily at 12 noon.  . citalopram (CELEXA) 40 MG tablet Take 1.5 tablets (60 mg total) by mouth daily.  . indomethacin (INDOCIN) 50 MG capsule Take 1 capsule (50 mg total) by mouth 2 (two) times daily as needed (with food).  Marland Kitchen ipratropium (ATROVENT) 0.06 % nasal spray Place 2 sprays into the nose 3 (three) times daily.  . metoprolol tartrate (LOPRESSOR) 25 MG tablet Take 1 tablet (25 mg total) by mouth 2 (two) times daily.  . simvastatin (ZOCOR) 20 MG tablet Take 1 tablet (20 mg total) by mouth at bedtime.  . tamsulosin (FLOMAX) 0.4 MG CAPS capsule Take 1 capsule (0.4 mg total) by mouth daily.   No facility-administered encounter medications on file as of 08/23/2017.     Orders for this visit: No orders of the defined types were placed in this encounter.   Thank  you for the visitation and for allowing  [facility] to assist in the care of your patient. Our recommendations are noted above.  Please contact us if we can be of further service.  Marda Stalker M.D.  Pulmonary and Critical Care Office Number: [phone]

## 2017-08-23 NOTE — Addendum Note (Signed)
Addended by: Oscar La R on: 08/23/2017 10:41 AM   Modules accepted: Orders

## 2017-08-23 NOTE — Patient Instructions (Signed)
--  Start nebulizer with medications 2 to 3 times per day.   --Make sure that you get a flu shot this year.

## 2017-11-09 ENCOUNTER — Ambulatory Visit: Payer: Medicare Other

## 2017-11-10 ENCOUNTER — Ambulatory Visit
Admission: RE | Admit: 2017-11-10 | Discharge: 2017-11-10 | Disposition: A | Discharge status: Home / Self Care | Payer: Medicare Other | Source: Ambulatory Visit | Attending: Internal Medicine | Admitting: Internal Medicine

## 2017-11-10 DIAGNOSIS — D3501 Benign neoplasm of right adrenal gland: Secondary | ICD-10-CM | POA: Diagnosis not present

## 2017-11-10 DIAGNOSIS — R918 Other nonspecific abnormal finding of lung field: Secondary | ICD-10-CM

## 2017-11-10 DIAGNOSIS — N200 Calculus of kidney: Secondary | ICD-10-CM | POA: Insufficient documentation

## 2017-11-10 DIAGNOSIS — I2510 Atherosclerotic heart disease of native coronary artery without angina pectoris: Secondary | ICD-10-CM | POA: Insufficient documentation

## 2017-11-10 DIAGNOSIS — I288 Other diseases of pulmonary vessels: Secondary | ICD-10-CM | POA: Diagnosis not present

## 2017-11-10 DIAGNOSIS — I700 Atherosclerosis of aorta: Secondary | ICD-10-CM | POA: Diagnosis not present

## 2017-11-10 NOTE — Progress Notes (Signed)
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  --Chronic dyspnea with chronic cough. -He has been on both Spiriva and Symbicort in the past, he has stopped both due to ineffectiveness. --Finds Ventolin effective, will continue. - We will add Perforomist nebulized.  Obstructive sleep apnea  --Continue using CPAP every night.  --Reminded him to try to put the CPAP back on whenever he takes it off.   Bronchiectasis.  --Seen on CT scanning, he has excess mucus production in the am, but other than that does well.   Lung nodule.  --New left lower lobe lung nodule seen since previous scan, other nodules and findings are unchanged. --We will order repeat CT chest in 6 months  History of Pulmonary embolism.  -- No longer on eliquis, s/p filter in right upper extremity.   Chronic Rhinitis.  --Has been present for several years, not better with nasal steroids.  --Continue nasal ipratropium.   Orders Placed This Encounter  Procedures  . CT Chest Wo Contrast-- in 6 months.    Meds ordered this encounter  Medications  . formoterol (PERFOROMIST) 20 MCG/2ML nebulizer solution    Sig: Take 2 mLs (20 mcg total) by nebulization 2 (two) times daily.    Dispense:  120 mL    Refill:  5   Return in about 6 months (around 05/13/2018).    CC: Chief Complaint  Patient presents with  . COPD    SOB worse in mornings and w/activity: NP cough; feels mucus is stuck; throat irritation causing to cough frequently.  . Sleep Apnea    doing well on CPAP      Brief History: Mr. Shams first saw the [facility] clinic in 05/2013 for COPD and cough. He had a lot of post nasal drip. PFT's showed moderate obstruction in 04/2013 (Ratio 47%, FEV1 1.73L, 54% predicted), noted to have a sleep study, on CPAP of 10. Hx of RUL cavitary lesion 10/2015, getting smaller.   Patient  presents today for follow-up visit, he has a history of COPD, obstructive sleep apnea, lung nodule. He has already completed pulmonary rehab. He notes that he continues to have dyspnea with moderate dyspnea of walking a few hundred feet, such as taking the garbage cans to the end of the street, or yard work. He stopped symbicort twice daily, he did not feel that it was helping, and after he stopped it he did not notice a difference.  He continues to use ventolin 2 or 3 times per day which he feels helps. He was on spiriva in the past but did not feel that it helped. He is a non smoker. He has a nebulizer once or twice per day. He continues to have a chronic cough, but he continues to take tussionex which helps but he continues to have a significant cough throughout the day.He has been tried on tessalon in the past but did not help.   He has been using cpap every night, he recently got a new machine and appears to be doing well with it. His compliance could be better, he takes it off to urinate but often does not put it back on.   Download data: **30 days as of 11/11/17; usage greater than 4 hours is 20/30 days.  Uses is 5 hours 21 minutes.  Set pressure is 10.  Residual AHI is 2.9. **9/1-9/30/18; uses greater than  4 hours equals 0%. Used 11/30 days. Resume usage on days used is 2 hours 18 minutes. CPAP set at 10 cm of H2O. Residual AHI equals 7.7. **5/20-6/18/18; uses greater than 4 hours equals 43%. Used 27/30 days. Average usage is 4 hours 31 minutes. CPAP set at 10 cm H2O. Residual AHI equals 5.2  Ct chest 11/10/17 in comparison with 11/06/16 On my personal review there are new nodule on the LLL. Other changes are stable,  there is scarring/nodule in the RUL, which is unchanged. Mild bullous emphysema, bronchiectasis.    6MWT- total distance 748 feet, lowest saturation 95%, highest heart rate 89  Medication:     Current Outpatient Medications:  .  albuterol (VENTOLIN HFA) 108 (90 Base) MCG/ACT  inhaler, Inhale 1-2 puffs into the lungs every 6 (six) hours as needed for wheezing or shortness of breath., Disp: 18 g, Rfl: 5 .  allopurinol (ZYLOPRIM) 100 MG tablet, Take 1 tablet (100 mg total) by mouth daily., Disp: 90 tablet, Rfl: 3 .  aspirin 81 MG EC tablet, Take 81 mg by mouth daily.  , Disp: , Rfl:  .  budesonide-formoterol (SYMBICORT) 160-4.5 MCG/ACT inhaler, INHALE 2 PUFFS INTO THE LUNGS TWICE DAILY *RINSE AFTER USE*, Disp: 10.2 g, Rfl: 12 .  chlorpheniramine-HYDROcodone (TUSSIONEX PENNKINETIC ER) 10-8 MG/5ML SUER, Take 5 mLs by mouth at bedtime as needed for cough., Disp: 140 mL, Rfl: 0 .  Cholecalciferol 2000 units CAPS, Take 2,000 Units by mouth daily at 12 noon., Disp: , Rfl:  .  citalopram (CELEXA) 40 MG tablet, Take 1.5 tablets (60 mg total) by mouth daily., Disp: 135 tablet, Rfl: 3 .  indomethacin (INDOCIN) 50 MG capsule, Take 1 capsule (50 mg total) by mouth 2 (two) times daily as needed (with food)., Disp: 180 capsule, Rfl: 1 .  ipratropium (ATROVENT) 0.06 % nasal spray, Place 2 sprays into the nose 3 (three) times daily., Disp: 15 mL, Rfl: 1 .  ipratropium-albuterol (DUONEB) 0.5-2.5 (3) MG/3ML SOLN, Take 3 mLs by nebulization 3 (three) times daily. DX Code: COPD J44.9, Disp: 360 mL, Rfl: 5 .  metoprolol tartrate (LOPRESSOR) 25 MG tablet, Take 1 tablet (25 mg total) by mouth 2 (two) times daily., Disp: 180 tablet, Rfl: 3 .  simvastatin (ZOCOR) 20 MG tablet, Take 1 tablet (20 mg total) by mouth at bedtime., Disp: 90 tablet, Rfl: 3 .  tamsulosin (FLOMAX) 0.4 MG CAPS capsule, Take 1 capsule (0.4 mg total) by mouth daily., Disp: 90 capsule, Rfl: 3   Review of Systems  Constitutional: Negative for chills, fever and malaise/fatigue.  HENT: Negative for hearing loss.   Eyes: Negative for blurred vision.  Respiratory: Positive for sputum production and shortness of breath. Negative for cough and wheezing.   Cardiovascular: Negative for chest pain.  Gastrointestinal: Negative for  heartburn and nausea.  Musculoskeletal: Negative for myalgias.  Skin: Negative for itching and rash.  Neurological: Positive for weakness. Negative for dizziness and headaches.  Endo/Heme/Allergies: Does not bruise/bleed easily.  Psychiatric/Behavioral: Negative for depression.      Allergies:  Tessalon [benzonatate]  Physical Examination:  VS: BP 128/70 (BP Location: Left Arm, Cuff Size: Normal)   Pulse 66   Ht 5\' 8"  (1.727 m)   Wt 209 lb (94.8 kg)   SpO2 94%   BMI 31.78 kg/m   General Appearance: No distress  HEENT: PERRLA, no ptosis, no other lesions noticed Pulmonary: Coarse upper airway sounds, diaphragmatic excursion normal.No wheezing, No rales   Cardiovascular:  Normal S1,S2.  No m/r/g.  Abdomen:Exam: Benign, Soft, non-tender, No masses  Skin:   warm, no rashes, no ecchymosis  Extremities: normal, no cyanosis, clubbing, warm with normal capillary refill.        Updated Medication List Outpatient Encounter Medications as of 11/12/2017  Medication Sig  . albuterol (VENTOLIN HFA) 108 (90 Base) MCG/ACT inhaler Inhale 1-2 puffs into the lungs every 6 (six) hours as needed for wheezing or shortness of breath.  . allopurinol (ZYLOPRIM) 100 MG tablet Take 1 tablet (100 mg total) by mouth daily.  Marland Kitchen aspirin 81 MG EC tablet Take 81 mg by mouth daily.    . budesonide-formoterol (SYMBICORT) 160-4.5 MCG/ACT inhaler INHALE 2 PUFFS INTO THE LUNGS TWICE DAILY *RINSE AFTER USE*  . chlorpheniramine-HYDROcodone (TUSSIONEX PENNKINETIC ER) 10-8 MG/5ML SUER Take 5 mLs by mouth at bedtime as needed for cough.  . Cholecalciferol 2000 units CAPS Take 2,000 Units by mouth daily at 12 noon.  . citalopram (CELEXA) 40 MG tablet Take 1.5 tablets (60 mg total) by mouth daily.  . indomethacin (INDOCIN) 50 MG capsule Take 1 capsule (50 mg total) by mouth 2 (two) times daily as needed (with food).  Marland Kitchen ipratropium (ATROVENT) 0.06 % nasal spray Place 2 sprays into the nose 3 (three) times daily.  Marland Kitchen  ipratropium-albuterol (DUONEB) 0.5-2.5 (3) MG/3ML SOLN Take 3 mLs by nebulization 3 (three) times daily. DX Code: COPD J44.9  . metoprolol tartrate (LOPRESSOR) 25 MG tablet Take 1 tablet (25 mg total) by mouth 2 (two) times daily.  . simvastatin (ZOCOR) 20 MG tablet Take 1 tablet (20 mg total) by mouth at bedtime.  . tamsulosin (FLOMAX) 0.4 MG CAPS capsule Take 1 capsule (0.4 mg total) by mouth daily.   No facility-administered encounter medications on file as of 11/12/2017.      Thank  you for the visitation and for allowing  [facility] to assist in the care of your patient. Our recommendations are noted above.  Please contact us if we can be of further service.  Marda Stalker M.D.  Pulmonary and Critical Care Office Number: [phone]

## 2017-11-11 ENCOUNTER — Encounter: Payer: Self-pay | Admitting: Internal Medicine

## 2017-11-11 ENCOUNTER — Ambulatory Visit: Admission: RE | Admit: 2017-11-11 | Payer: Medicare Other | Source: Ambulatory Visit

## 2017-11-12 ENCOUNTER — Encounter: Payer: Self-pay | Admitting: Internal Medicine

## 2017-11-12 ENCOUNTER — Ambulatory Visit (INDEPENDENT_AMBULATORY_CARE_PROVIDER_SITE_OTHER): Payer: Medicare Other | Admitting: Internal Medicine

## 2017-11-12 VITALS — BP 128/70 | HR 66 | Ht 68.0 in | Wt 209.0 lb

## 2017-11-12 DIAGNOSIS — I2510 Atherosclerotic heart disease of native coronary artery without angina pectoris: Secondary | ICD-10-CM

## 2017-11-12 DIAGNOSIS — J449 Chronic obstructive pulmonary disease, unspecified: Secondary | ICD-10-CM

## 2017-11-12 DIAGNOSIS — R918 Other nonspecific abnormal finding of lung field: Secondary | ICD-10-CM

## 2017-11-12 MED ORDER — FORMOTEROL FUMARATE 20 MCG/2ML IN NEBU: 20.0000 ug | INHALATION_SOLUTION | Freq: Two times a day (BID) | RESPIRATORY_TRACT | 5 refills | Status: DC

## 2017-11-12 NOTE — Addendum Note (Signed)
Addended by: Oscar La R on: 11/12/2017 10:51 AM   Modules accepted: Orders

## 2017-11-12 NOTE — Patient Instructions (Addendum)
Continue ventolin, will start perforomist through your nebulizer, once or twice daily.   Continue using your CPAP, try to remember to put it back on at night.

## 2017-11-29 ENCOUNTER — Encounter (INDEPENDENT_AMBULATORY_CARE_PROVIDER_SITE_OTHER): Payer: Self-pay

## 2017-11-30 ENCOUNTER — Encounter: Payer: Self-pay | Admitting: Family Medicine

## 2017-11-30 ENCOUNTER — Ambulatory Visit (INDEPENDENT_AMBULATORY_CARE_PROVIDER_SITE_OTHER): Payer: Medicare Other | Admitting: Family Medicine

## 2017-11-30 DIAGNOSIS — W19XXXS Unspecified fall, sequela: Secondary | ICD-10-CM

## 2017-11-30 DIAGNOSIS — R05 Cough: Secondary | ICD-10-CM

## 2017-11-30 DIAGNOSIS — R059 Cough, unspecified: Secondary | ICD-10-CM

## 2017-11-30 MED ORDER — PREDNISONE 10 MG PO TABS: ORAL_TABLET | ORAL | 0 refills | Status: DC

## 2017-11-30 MED ORDER — IPRATROPIUM-ALBUTEROL 0.5-2.5 (3) MG/3ML IN SOLN: 3.0000 mL | Freq: Three times a day (TID) | RESPIRATORY_TRACT | Status: DC

## 2017-11-30 NOTE — Patient Instructions (Signed)
Start prednisone with food in the meantime.  Take care.  Glad to see you.

## 2017-11-30 NOTE — Progress Notes (Signed)
Feel in the bathroom about 2 weeks ago.  Hit his forehead and nose. Slipped getting up from the toilet.  No LOC.  Taped shut the cut on the nose.  He didn't get eval at the time.    L forearm skin tear.  Separate incident.  He was helping his wife and then sat down.  He accidentally scraped his arm when the chair broke under him.  No other injury at the time.    His wife is in rehab facility currently after mult surgeries.  D/w pt.    Cough continues. Voice is slightly hoarse.  No ST, not much chest congestion noted by patient but a lot of upper airway mucous production.  Bronchiectasis hx noted. No fevers.  No vomiting.  He can episodically get SOB but not currently.  His exercise tolerance is lower than his prev typical baseline.  This wasn't an acute change.  Symbicort didn't help and he stopped that med.  Sputum is white.    Meds, vitals, and allergies reviewed.   ROS: Per HPI unless specifically indicated in ROS section   nad ncat except for healing lac on the bridge of the nose Mmm Neck supple, no LA rrr ctab No wheeze abd soft L forearm with healing skin tear, covered with nonstick bandage.  No drainage. Doesn't appear infected.

## 2017-12-01 DIAGNOSIS — W19XXXA Unspecified fall, initial encounter: Secondary | ICD-10-CM | POA: Insufficient documentation

## 2017-12-01 NOTE — Assessment & Plan Note (Signed)
One from slipping getting up from the toilet, cautions d/w pt.  The other when a chair broke under him, which was an accident.  Continue topical care for skin tear on the L forearm, should heal.

## 2017-12-01 NOTE — Assessment & Plan Note (Addendum)
He has bronchiectasis.  No fevers.  White sputum.  symbicort didn't help.  D/w pt about options.  pred isn't a good long term med but reasonable to try in the short run, with routine cautions given.  He agrees.  Update me as needed. Ctab.  Okay for outpatient f/u.

## 2017-12-16 ENCOUNTER — Encounter: Payer: Self-pay | Admitting: Family Medicine

## 2017-12-17 ENCOUNTER — Telehealth: Payer: Self-pay | Admitting: Family Medicine

## 2017-12-17 MED ORDER — AMOXICILLIN-POT CLAVULANATE 875-125 MG PO TABS: 1.0000 | ORAL_TABLET | Freq: Two times a day (BID) | ORAL | 0 refills | Status: DC

## 2017-12-17 NOTE — Telephone Encounter (Signed)
Please call pt.  I saw the [employer].  Given the discolored mucous and the prev pred use, I would start augmentin and get him on the schedule for Monday with him to get checked over the weekend if worse in the meantime.I sent the rx to [place].  Thanks.

## 2017-12-17 NOTE — Telephone Encounter (Signed)
Patient advised. Appointment scheduled.  

## 2017-12-20 ENCOUNTER — Encounter: Payer: Self-pay | Admitting: Family Medicine

## 2017-12-20 ENCOUNTER — Ambulatory Visit (INDEPENDENT_AMBULATORY_CARE_PROVIDER_SITE_OTHER): Payer: Medicare Other | Admitting: Family Medicine

## 2017-12-20 DIAGNOSIS — J418 Mixed simple and mucopurulent chronic bronchitis: Secondary | ICD-10-CM | POA: Diagnosis not present

## 2017-12-20 NOTE — Patient Instructions (Addendum)
Get back to using the incentive spirometer to get your lungs stronger.  Keep taking the augmentin in the meantime. Finish what you have.   Take care.  Glad to see you.  Update me as needed.

## 2017-12-20 NOTE — Progress Notes (Signed)
Recheck pulse ox 94%.    He had started augmentin with prev yellow sputum now turning clear.  No fevers.  He feels some better, clearly improved from Friday.  He is SOB at baseline.  Still using duoneb but not needing it TID.  He is still caring for his wife at baseline.    Not on prednisone currently.  Still coughing.  He has history of COPD with bronchiectasis previously noted per pulmonary notes.  Discussed with patient about pathophysiology of both.  He recently started back using incentive spirometry.  Meds, vitals, and allergies reviewed.   ROS: Per HPI unless specifically indicated in ROS section   GEN: nad, alert and oriented HEENT: mucous membranes moist, nasal exam slightly stuffy, clear rhinorrhea noted. NECK: supple w/o LA CV: rrr. PULM: Cough noted but otherwise Ctab, no inc wob , no wheeze, no focal decrease in breath sounds. ABD: soft, +bs EXT: no edema SKIN: no acute rash but senile purpura noted on the B arms.

## 2017-12-21 NOTE — Assessment & Plan Note (Signed)
He still has some shortness of breath at baseline.  He is using his nebulizer as needed.  He does feel some better compared to the end of last week.  His sputum is clearing up.  Continue Augmentin for now.  Continue use of incentive spirometer at home to see if that helps at all.  Rationale discussed with patient.  Okay for outpatient follow-up.  He agrees.

## 2017-12-28 ENCOUNTER — Encounter: Payer: Self-pay | Admitting: Family Medicine

## 2018-01-25 ENCOUNTER — Ambulatory Visit (INDEPENDENT_AMBULATORY_CARE_PROVIDER_SITE_OTHER): Payer: Medicare Other | Admitting: Physician Assistant

## 2018-01-25 ENCOUNTER — Encounter: Payer: Self-pay | Admitting: Physician Assistant

## 2018-01-25 VITALS — BP 150/60 | HR 70 | Ht 69.0 in | Wt 208.0 lb

## 2018-01-25 DIAGNOSIS — E782 Mixed hyperlipidemia: Secondary | ICD-10-CM | POA: Diagnosis not present

## 2018-01-25 DIAGNOSIS — I5032 Chronic diastolic (congestive) heart failure: Secondary | ICD-10-CM | POA: Diagnosis not present

## 2018-01-25 DIAGNOSIS — Z86711 Personal history of pulmonary embolism: Secondary | ICD-10-CM | POA: Diagnosis not present

## 2018-01-25 DIAGNOSIS — I272 Pulmonary hypertension, unspecified: Secondary | ICD-10-CM | POA: Diagnosis not present

## 2018-01-25 DIAGNOSIS — J42 Unspecified chronic bronchitis: Secondary | ICD-10-CM

## 2018-01-25 DIAGNOSIS — I2510 Atherosclerotic heart disease of native coronary artery without angina pectoris: Secondary | ICD-10-CM

## 2018-01-25 DIAGNOSIS — R0602 Shortness of breath: Secondary | ICD-10-CM | POA: Diagnosis not present

## 2018-01-25 DIAGNOSIS — I739 Peripheral vascular disease, unspecified: Secondary | ICD-10-CM

## 2018-01-25 DIAGNOSIS — R350 Frequency of micturition: Secondary | ICD-10-CM | POA: Diagnosis not present
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
## 2018-01-25 NOTE — Patient Instructions (Signed)
Medication Instructions: - Your physician recommends that you continue on your current medications as directed. Please refer to the Current Medication list given to you today.  Labwork: - Your physician recommends that you have lab work today: [employer]  Procedures/Testing: - Your physician has requested that you have an echocardiogram. Echocardiography is a painless test that uses sound waves to create images of your heart. It provides your doctor with information about the size and shape of your heart and how well your heart's chambers and valves are working. This procedure takes approximately one hour. There are no restrictions for this procedure.   Follow-Up: - Your physician recommends that you schedule a follow-up appointment in: 1 month with Dr. Dayton Martes, PA   Any Additional Special Instructions Will Be Listed Below (If Applicable).     If you need a refill on your cardiac medications before your next appointment, please call your pharmacy.

## 2018-01-26 ENCOUNTER — Telehealth: Payer: Self-pay | Admitting: Cardiovascular Disease

## 2018-01-26 LAB — BASIC METABOLIC PANEL
BUN/Creatinine Ratio: 26 — ABNORMAL HIGH (ref 10–24)
BUN: 28 mg/dL — AB (ref 8–27)
CALCIUM: 9 mg/dL (ref 8.6–10.2)
CHLORIDE: 107 mmol/L — AB (ref 96–106)
CO2: 24 mmol/L (ref 20–29)
Creatinine, Ser: 1.08 mg/dL (ref 0.76–1.27)
GFR, EST AFRICAN AMERICAN: 72 mL/min/{1.73_m2} (ref >59)
GFR, EST NON AFRICAN AMERICAN: 63 mL/min/{1.73_m2} (ref >59)
Glucose: 129 mg/dL — ABNORMAL HIGH (ref 65–99)
POTASSIUM: 4.2 mmol/L (ref 3.5–5.2)
Sodium: 145 mmol/L — ABNORMAL HIGH (ref 134–144)

## 2018-01-26 NOTE — Telephone Encounter (Signed)
Patient returning call.

## 2018-01-26 NOTE — Telephone Encounter (Signed)
No answer. Left detail message with results, ok per DPR, and to call back if any questions.

## 2018-02-04 ENCOUNTER — Other Ambulatory Visit: Payer: Self-pay | Admitting: Family Medicine

## 2018-02-04 NOTE — Telephone Encounter (Signed)
Electronic refill request. Indomethacin Last office visit:   12/20/17 Last Filled:    180 capsule 1 08/10/2017  Please advise.

## 2018-02-06 NOTE — Telephone Encounter (Signed)
Sent. Thanks.   

## 2018-02-11 ENCOUNTER — Ambulatory Visit (INDEPENDENT_AMBULATORY_CARE_PROVIDER_SITE_OTHER): Payer: Medicare Other

## 2018-02-11 ENCOUNTER — Other Ambulatory Visit: Payer: Self-pay

## 2018-02-11 DIAGNOSIS — R0602 Shortness of breath: Secondary | ICD-10-CM

## 2018-02-11 DIAGNOSIS — I517 Cardiomegaly: Secondary | ICD-10-CM

## 2018-02-11 DIAGNOSIS — I340 Nonrheumatic mitral (valve) insufficiency: Secondary | ICD-10-CM

## 2018-02-11 DIAGNOSIS — I5189 Other ill-defined heart diseases: Secondary | ICD-10-CM

## 2018-02-11 HISTORY — DX: Other ill-defined heart diseases: I51.89

## 2018-02-11 HISTORY — DX: Nonrheumatic mitral (valve) insufficiency: I34.0

## 2018-02-11 HISTORY — DX: Cardiomegaly: I51.7

## 2018-02-14 ENCOUNTER — Encounter: Payer: Self-pay | Admitting: *Deleted

## 2018-02-14 ENCOUNTER — Telehealth: Payer: Self-pay | Admitting: *Deleted

## 2018-02-14 DIAGNOSIS — R0602 Shortness of breath: Secondary | ICD-10-CM

## 2018-02-14 NOTE — Telephone Encounter (Signed)
Spoke with patient and scheduled stress test to be done next week. Reviewed all instructions, date, time, and location for next week and he verbalized understanding of our conversation with no further questions. Patient requested mychart message with instructions for testing. He was appreciative for the call with no further questions.  [place]  Your caregiver has ordered a Stress Test with nuclear imaging. The purpose of this test is to evaluate the blood supply to your heart muscle. This procedure is referred to as a "Non-Invasive Stress Test." This is because other than having an IV started in your vein, nothing is inserted or "invades" your body. Cardiac stress tests are done to find areas of poor blood flow to the heart by determining the extent of coronary artery disease (CAD). Some patients exercise on a treadmill, which naturally increases the blood flow to your heart, while others who are  unable to walk on a treadmill due to physical limitations have a pharmacologic/chemical stress agent called Lexiscan . This medicine will mimic walking on a treadmill by temporarily increasing your coronary blood flow.   Please note: these test may take anywhere between 2-4 hours to complete  PLEASE REPORT TO [facility] AT THE FIRST DESK WILL DIRECT YOU WHERE TO GO  Date of Procedure:__Thursday April 4th___  Arrival Time for Procedure:__07:45AM_____  Instructions regarding medication:   _X___:  Hold Metoprolol the night before procedure and morning of procedure    PLEASE NOTIFY THE OFFICE AT LEAST 24 HOURS IN ADVANCE IF YOU ARE UNABLE TO KEEP YOUR APPOINTMENT.  [phone] AND  PLEASE NOTIFY NUCLEAR MEDICINE AT [facility] AT LEAST 24 HOURS IN ADVANCE IF YOU ARE UNABLE TO KEEP YOUR APPOINTMENT. [phone]  How to prepare for your Myoview test:  1. Do not eat or drink after midnight 2. No caffeine for 24 hours prior to test 3. No smoking 24 hours prior to  test. 4. Your medication may be taken with water.  If your doctor stopped a medication because of this test, do not take that medication. 5. Ladies, please do not wear dresses.  Skirts or pants are appropriate. Please wear a short sleeve shirt. 6. No perfume, cologne or lotion. 7. Wear comfortable walking shoes. No heels!

## 2018-02-14 NOTE — Telephone Encounter (Signed)
Spoke with patient and reviewed results and recommendations with him. He was agreeable to have stress test done and then follow up with Dr. Rockey Situ. Will enter order, review chart, and then reach back out to schedule this with him. He was appreciative for the call with no further questions at this time.

## 2018-02-14 NOTE — Telephone Encounter (Signed)
Left voicemail message to call back  

## 2018-02-14 NOTE — Telephone Encounter (Signed)
-----   Message from Rise Mu, PA-C sent at 02/14/2018  7:09 AM EDT ----- Please call the patient. EF is mildly lower than prior study in 2015. Diastolic dysfunction known. Pulmonary hypertension stable. Mild to moderate mitral regurgitation which can be followed periodically.   Is patient more interested in initiating a diuretic at this time? Is he interested in stress testing given mild reduction in EF? Please let patient know I have forwarded to Dr. Rockey Situ for his input as well.

## 2018-02-15 ENCOUNTER — Telehealth: Payer: Self-pay | Admitting: Cardiovascular Disease

## 2018-02-15 NOTE — Telephone Encounter (Signed)
Patient called back as his internet is not working and he can not access MyChart w/stress test instructions. Reviewed instructions w/patient who verbalized understanding and repeated back to me. He will call back if he has any further questions.

## 2018-02-24 ENCOUNTER — Encounter
Admission: RE | Admit: 2018-02-24 | Discharge: 2018-02-24 | Disposition: A | Discharge status: Home / Self Care | Payer: Medicare Other | Source: Ambulatory Visit | Attending: Physician Assistant | Admitting: Physician Assistant

## 2018-02-24 DIAGNOSIS — R0602 Shortness of breath: Secondary | ICD-10-CM

## 2018-02-25 ENCOUNTER — Encounter
Admission: RE | Admit: 2018-02-25 | Discharge: 2018-02-25 | Disposition: A | Discharge status: Home / Self Care | Payer: Medicare Other | Source: Ambulatory Visit | Attending: Physician Assistant | Admitting: Physician Assistant

## 2018-02-25 DIAGNOSIS — R0602 Shortness of breath: Secondary | ICD-10-CM | POA: Diagnosis not present

## 2018-02-25 LAB — NM MYOCAR MULTI W/SPECT W/WALL MOTION / EF
CHL CUP NUCLEAR SRS: 8
CHL CUP RESTING HR STRESS: 54 {beats}/min
CSEPED: 0 min
CSEPEDS: 0 s
CSEPHR: 51 %
Estimated workload: 1 METS
LV dias vol: 144 mL (ref 62–150)
LVSYSVOL: 74 mL
MPHR: 135 {beats}/min
Peak HR: 69 {beats}/min
SDS: 3
SSS: 11
TID: 1.03

## 2018-02-25 MED ORDER — REGADENOSON 0.4 MG/5ML IV SOLN
0.4000 mg | Freq: Once | INTRAVENOUS | Status: AC
  Administered 2018-02-25: 0.4 mg via INTRAVENOUS

## 2018-02-25 MED ORDER — TECHNETIUM TC 99M TETROFOSMIN IV KIT
10.0000 | PACK | Freq: Once | INTRAVENOUS | Status: AC | PRN
  Administered 2018-02-25: 13.33 via INTRAVENOUS

## 2018-02-25 MED ORDER — TECHNETIUM TC 99M TETROFOSMIN IV KIT
32.8100 | PACK | Freq: Once | INTRAVENOUS | Status: AC | PRN
  Administered 2018-02-25: 32.81 via INTRAVENOUS

## 2018-02-25 NOTE — Progress Notes (Unsigned)
Cardiology Office Note  Date:  02/25/2018   ID:  Christopher Santiago, DOB [birth year], MRN [id number]  PCP:  Christopher Ghent, MD   No chief complaint on file.   HPI:  Christopher Santiago is a  84 year old man with a history of  diffuse moderate three-vessel coronary artery disease cardiac catheterization in 2006,  PVD of the LE,  hypertension,  hyperlipidemia,  diabetes,  PAD,  COPD,  smoked for 50 years,  obstructive sleep apnea,  uses CPAP.  diagnosis in December 2015 with DVT and bilateral PE,  started on anticoagulation, also nodule in the lung (benign on biopsy) IVC filter placed, off eliquis, Now on aspirin Ascending aortic aneurysm 4 cm  He returns today for routine followup of his DVT and PE, shortness of breath, coronary artery disease  Seen in the clinic January 25, 2018 for shortness of breath over 4 weeks High salt and fluid diet daily Urinary incontinence.  Wearing depends undergarments Significant postnasal drip  Stress test performed February 25, 2018 with anterior wall defect consistent with MI, mild peri-infarct ischemia ejection fraction 48%  Echocardiogram performed showing mildly reduced ejection fraction 45-50% Mildly elevated right heart pressures Ejection fraction has declined from 65% in December 2015   has increased 1 mm in diameter, currently 4.2 cm in diameter, on CT scan 10/2016  Biggest complaint is chronic fatigue He is not exercising, used to go to the gym Gait instability, legs feel weak, shortness of breath on exertion He reports he has less energy, feels he can only do half of what he used to do Used to lift weights.  Now with chronic cough, sputum off Spiriva Weight down 10 pounds, Eating less, busy taking care of his wife Wife with severe joint issues,shoulders, hips,   Tolerating simvastatin  Has  IVC filter with Christopher Santiago, decided to keep in place  EKG on today's visit shows normal sinus rhythm with rate 57 bpm, right bundle branch block, left  anterior fascicular block  Other past medical history Previous lower extremity Doppler 11/04/2014 with nearly occlusive thrombus of central portion of the left femoral vein and profunda femoral vein, no DVT on the right CT scan December 13 showed acute large burden of bilateral pulmonary emboli with right heart strain, ascending aortic aneurysm, right upper lobe spiculated cavitary mass concerning for malignancy PET scan December 17 with hypermetabolic cavity right upper lobe lesion worrisome for malignancy Echocardiogram December 13 with normal ejection fraction otherwise normal study  He was started on anticoagulation, eliquis 5 BID  He smoked for 50 years, stopping over 10 years ago.  Previous lab work,Hemoglobin A1c 5.8, total cholesterol 145, LDL 78  CORONARY ANGIOGRAPHY: In 2006, November  .  The left anterior descending artery: In the proximal LAD there was a tubular 30-40% lesion. In the proximal to mid LAD, right at the takeoff of the second diagonal, there was a 50% tubular lesion. The mid LAD there was a 40% tubular lesion. In the lower branch of the first diagonal there is a 99% lesion with subtotal occlusion of the vessel.  3.  The left circumflex was a large vessel. It gave off a large branching ramus, a large OM-1 and moderate-to-large OM-2 and OM-3. In the mid      circumflex there was a 30% lesion, followed by a 50-60% lesion at the takeoff of the OM-2. Distally there was a 30% lesion. In the upper branch of a large ramus there was a 60-70% lesion. In the OM-1 there  was a 50% mid portion, followed by a 70-80% tubular lesion. In the OM-2 there was a 50% proximal lesion.  4.  The right coronary artery was a dominant vessel. There was a 40% proximally, which possibly related to catheter induced spasm -- though it did not change much with nitroglycerin throughout the proximal portion. There were multiple irregularities about 30-40% stenoses. The mid portion, after the takeoff the RV  branch, there was a 40% stenosis. Distally, prior to the takeoff of the PDA, there was 40% stenosis. There was a moderate size PDA and 2 small PLs.   PMH:   has a past medical history of Ascending aortic aneurysm (HCC), Benign prostatic hypertrophy, CAD (coronary artery disease), Cancer ([place]), COPD (chronic obstructive pulmonary disease) ([place]), Depression, Diabetes mellitus, DVT (deep venous thrombosis) ([place]), Hyperlipidemia, Hypertension, Hypogonadism male, Multiple contusions, OSA (obstructive sleep apnea), PE (pulmonary embolism) (10/2014), Pulmonary hypertension ([place]), and Trimalleolar fracture.  PSH:    Past Surgical History:  Procedure Laterality Date  . CARDIAC CATHETERIZATION  15-Oct-2005   dead spot, two small occlusions , EF 50-55-55% mild -Mod Dz   . HERNIA REPAIR  07/04/01  . LARYNGOSCOPY     with biopsy, right vocal cord lesion   . PROSTATE SURGERY     not full excision  . VENA CAVA FILTER PLACEMENT  2015    Current Outpatient Medications  Medication Sig Dispense Refill  . albuterol (VENTOLIN HFA) 108 (90 Base) MCG/ACT inhaler Inhale 1-2 puffs into the lungs every 6 (six) hours as needed for wheezing or shortness of breath. 18 g 5  . allopurinol (ZYLOPRIM) 100 MG tablet Take 1 tablet (100 mg total) by mouth daily. 90 tablet 3  . aspirin 81 MG EC tablet Take 81 mg by mouth daily.      . chlorpheniramine-HYDROcodone (TUSSIONEX PENNKINETIC ER) 10-8 MG/5ML SUER Take 5 mLs by mouth at bedtime as needed for cough. 140 mL 0  . Cholecalciferol 2000 units CAPS Take 2,000 Units by mouth daily at 12 noon.    . citalopram (CELEXA) 40 MG tablet Take 1.5 tablets (60 mg total) by mouth daily. 135 tablet 3  . indomethacin (INDOCIN) 50 MG capsule TAKE 1 CAPSULE BY MOUTH 2 TIMES DAILY ASNEEDED WITH FOOD 180 capsule 1  . ipratropium (ATROVENT) 0.06 % nasal spray Place 2 sprays into the nose 3 (three) times daily. 15 mL 1  . ipratropium-albuterol (DUONEB) 0.5-2.5 (3) MG/3ML SOLN Take 3 mLs by  nebulization 3 (three) times daily. DX Code: COPD J44.9    . metoprolol tartrate (LOPRESSOR) 25 MG tablet Take 1 tablet (25 mg total) by mouth 2 (two) times daily. 180 tablet 3  . simvastatin (ZOCOR) 20 MG tablet Take 1 tablet (20 mg total) by mouth at bedtime. 90 tablet 3  . tamsulosin (FLOMAX) 0.4 MG CAPS capsule Take 1 capsule (0.4 mg total) by mouth daily. 90 capsule 3   No current facility-administered medications for this visit.      Allergies:   Tessalon [benzonatate]   Social History:  The patient  reports that he quit smoking about 24 years ago. His smoking use included cigarettes. He has a 79.50 pack-year smoking history. He has never used smokeless tobacco. He reports that he drinks about 16.8 oz of alcohol per week. He reports that he does not use drugs.   Family History:   family history includes Diabetes in his mother; Hypertension in his mother.    Review of Systems: Review of Systems  Constitutional: Positive for malaise/fatigue.  Respiratory: Positive for cough and shortness of breath.   Cardiovascular: Negative.   Gastrointestinal: Negative.   Musculoskeletal: Negative.   Neurological: Positive for weakness.  Psychiatric/Behavioral: Negative.   All other systems reviewed and are negative.    PHYSICAL EXAM: VS:  There were no vitals taken for this visit. , BMI There is no height or weight on file to calculate BMI. GEN: Well nourished, well developed, in no acute distress  HEENT: normal  Neck: no JVD, carotid bruits, or masses Cardiac: RRR; no murmurs, rubs, or gallops,no edema  Respiratory:  Coarse breath sounds, normal work of breathing GI: soft, nontender, nondistended, + BS MS: no deformity or atrophy  Skin: warm and dry, no rash Neuro:  Strength and sensation are intact Psych: euthymic mood, full affect    Recent Labs: 08/10/2017: ALT 13; Hemoglobin 12.0; Platelets 150.0 01/25/2018: BUN 28; Creatinine, Ser 1.08; Potassium 4.2; Sodium 145    Lipid  Panel Lab Results  Component Value Date   CHOL 127 08/10/2017   HDL 76.90 08/10/2017   LDLCALC 33 08/10/2017   TRIG 84.0 08/10/2017      Wt Readings from Last 3 Encounters:  01/25/18 208 lb (94.3 kg)  12/20/17 207 lb (93.9 kg)  11/30/17 207 lb 8 oz (94.1 kg)       ASSESSMENT AND PLAN:  HYPERCHOLESTEROLEMIA, PURE - Plan: EKG 12-Lead Cholesterol is at goal on the current lipid regimen. No changes to the medications were made.  Essential hypertension - Plan: EKG 12-Lead Blood pressure is well controlled on today's visit. No changes made to the medications.  Atherosclerosis of native coronary artery of native heart without angina pectoris - Plan: EKG 12-Lead Currently with no symptoms of angina. No further workup at this time. Continue current medication regimen.  Atherosclerosis of native coronary artery of native heart with stable angina pectoris ([place]) - Plan: EKG 12-Lead  PVD (peripheral vascular disease) ([place]) - Plan: EKG 12-Lead On simvastatin  OSA on CPAP - Plan: EKG 12-Lead Compliant on CPAP  Mixed simple and mucopurulent chronic bronchitis ([place]) - Plan: EKG 12-Lead Chronic cough, flem 50 years of smoking  History of DVT (deep vein thrombosis) - Plan: EKG 12-Lead Has IVC filter  History of pulmonary embolism - Plan: EKG 12-Lead  Ascending aortic aneurysm  has increased 1 mm in diameter, currently 4.2 cm in diameter, on CT scan 10/2016   Total encounter time more than 25 minutes  Greater than 50% was spent in counseling and coordination of care with the patient   Disposition:   F/U  6 months   No orders of the defined types were placed in this encounter.    Signed, Esmond Plants, M.D., Ph.D. 02/25/2018  [place], [place]

## 2018-02-28 ENCOUNTER — Ambulatory Visit: Payer: Medicare Other | Admitting: Cardiovascular Disease

## 2018-03-02 ENCOUNTER — Other Ambulatory Visit: Payer: Self-pay | Admitting: Cardiovascular Disease

## 2018-03-05 NOTE — Progress Notes (Signed)
Cardiology Office Note  Date:  03/09/2018   ID:  Christopher Santiago, DOB [birth year], MRN [id number]  PCP:  Tonia Ghent, MD   Chief Complaint  Patient presents with  . other    1 month follow up and discuss recent Echo. Meds reviewed by the pt. verbally. Pt. c/o shortness of breath.     HPI:  [name] is a  84 year old man with a history of  diffuse moderate three-vessel coronary artery disease cardiac catheterization in 2006,  PVD of the LE,  hypertension,  hyperlipidemia,  diabetes,  COPD, followed by pulmonary  smoked for 50 years,  obstructive sleep apnea uses CPAP.  diagnosis in December 2015 with DVT and bilateral PE, started on anticoagulation,  also nodule in the lung (benign on biopsy) IVC filter placed, off eliquis, Now on aspirin He presents for followup of his DVT and PE, shortness of breath, coronary artery disease A sending aorta dilation  4.2 cm in diameter, on CT scan 10/2016  He presents for follow-up discussion of recent studies Echocardiogram February 11, 2018, Discussed with him in detail  moderately dilated left atrium with mild to moderate MR Ejection fraction 19-50% diastolic dysfunction on the elevated right heart pressures 45  Stress test February 25, 2018, discussed in detail, images pulled up and discussed with him Prior MI anterior wall ejection fraction 48%  He is most troubled by chronic cough, sputum Fluid coming out of sinus when bending forward in the am Worried that "brain can leak fluid", after previous fall and head trauma  Sob with exertion, feels it is COPD Does not feel there is much change  Bothered by incontinence, wearing a diaper sometimes chronic fatigue Continues to take care of his wife who has severe leg edema, chronic orthopedic issues, recent surgeries No exercise Gait instability, legs are weak  Has  IVC filter with Dr. Lucky Cowboy, decided to keep in place   Other past medical history Previous lower extremity Doppler  11/04/2014 with nearly occlusive thrombus of central portion of the left femoral vein and profunda femoral vein, no DVT on the right CT scan December 13 showed acute large burden of bilateral pulmonary emboli with right heart strain, ascending aortic aneurysm, right upper lobe spiculated cavitary mass concerning for malignancy PET scan December 17 with hypermetabolic cavity right upper lobe lesion worrisome for malignancy Echocardiogram December 13 with normal ejection fraction otherwise normal study  He was started on anticoagulation, eliquis 5 BID  He smoked for 50 years, stopping over 10 years ago.  Previous lab work,Hemoglobin A1c 5.8, total cholesterol 145, LDL 78  CORONARY ANGIOGRAPHY: In 2006, November  .  The left anterior descending artery: In the proximal LAD there was a tubular 30-40% lesion. In the proximal to mid LAD, right at the takeoff of the second diagonal, there was a 50% tubular lesion. The mid LAD there was a 40% tubular lesion. In the lower branch of the first diagonal there is a 99% lesion with subtotal occlusion of the vessel.  3.  The left circumflex was a large vessel. It gave off a large branching ramus, a large OM-1 and moderate-to-large OM-2 and OM-3. In the mid      circumflex there was a 30% lesion, followed by a 50-60% lesion at the takeoff of the OM-2. Distally there was a 30% lesion. In the upper branch of a large ramus there was a 60-70% lesion. In the OM-1 there was a 50% mid portion, followed by a 70-80% tubular lesion.  In the OM-2 there was a 50% proximal lesion.  4.  The right coronary artery was a dominant vessel. There was a 40% proximally, which possibly related to catheter induced spasm -- though it did not change much with nitroglycerin throughout the proximal portion. There were multiple irregularities about 30-40% stenoses. The mid portion, after the takeoff the RV branch, there was a 40% stenosis. Distally, prior to the takeoff of the PDA, there was 40%  stenosis. There was a moderate size PDA and 2 small PLs.   PMH:   has a past medical history of Ascending aortic aneurysm (HCC), Benign prostatic hypertrophy, CAD (coronary artery disease), Cancer (Christopher Santiago), COPD (chronic obstructive pulmonary disease) ([place]), Depression, Diabetes mellitus, DVT (deep venous thrombosis) ([place]), Hyperlipidemia, Hypertension, Hypogonadism male, Multiple contusions, OSA (obstructive sleep apnea), PE (pulmonary embolism) (10/2014), Pulmonary hypertension ([place]), and Trimalleolar fracture.  PSH:    Past Surgical History:  Procedure Laterality Date  . CARDIAC CATHETERIZATION  10-Oct-2005   dead spot, two small occlusions , EF 50-55-55% mild -Mod Dz   . HERNIA REPAIR  07/04/01  . LARYNGOSCOPY     with biopsy, right vocal cord lesion   . PROSTATE SURGERY     not full excision  . VENA CAVA FILTER PLACEMENT  2015    Current Outpatient Medications  Medication Sig Dispense Refill  . albuterol (VENTOLIN HFA) 108 (90 Base) MCG/ACT inhaler Inhale 1-2 puffs into the lungs every 6 (six) hours as needed for wheezing or shortness of breath. 18 g 5  . allopurinol (ZYLOPRIM) 100 MG tablet Take 1 tablet (100 mg total) by mouth daily. 90 tablet 3  . aspirin 81 MG EC tablet Take 81 mg by mouth daily.      . chlorpheniramine-HYDROcodone (TUSSIONEX PENNKINETIC ER) 10-8 MG/5ML SUER Take 5 mLs by mouth at bedtime as needed for cough. 140 mL 0  . Cholecalciferol 2000 units CAPS Take 2,000 Units by mouth daily at 12 noon.    . citalopram (CELEXA) 40 MG tablet Take 1.5 tablets (60 mg total) by mouth daily. 135 tablet 3  . indomethacin (INDOCIN) 50 MG capsule TAKE 1 CAPSULE BY MOUTH 2 TIMES DAILY ASNEEDED WITH FOOD 180 capsule 1  . ipratropium (ATROVENT) 0.06 % nasal spray Place 2 sprays into the nose 3 (three) times daily. 15 mL 1  . ipratropium-albuterol (DUONEB) 0.5-2.5 (3) MG/3ML SOLN Take 3 mLs by nebulization 3 (three) times daily. DX Code: COPD J44.9    . metoprolol tartrate (LOPRESSOR) 25  MG tablet Take 1 tablet (25 mg total) by mouth 2 (two) times daily. 180 tablet 3  . simvastatin (ZOCOR) 20 MG tablet TAKE 1 TABLET BY MOUTH EVERY NIGHT AT BEDTIME 30 tablet 1  . tamsulosin (FLOMAX) 0.4 MG CAPS capsule Take 1 capsule (0.4 mg total) by mouth daily. 90 capsule 3   No current facility-administered medications for this visit.      Allergies:   Tessalon [benzonatate]   Social History:  The patient  reports that he quit smoking about 24 years ago. His smoking use included cigarettes. He has a 79.50 pack-year smoking history. He has never used smokeless tobacco. He reports that he drinks about 16.8 oz of alcohol per week. He reports that he does not use drugs.   Family History:   family history includes Diabetes in his mother; Hypertension in his mother.    Review of Systems: Review of Systems  Constitutional: Negative.   HENT: Positive for congestion.   Respiratory: Positive for cough and shortness  of breath.   Cardiovascular: Negative.   Gastrointestinal: Negative.   Musculoskeletal: Negative.   Neurological: Negative.   Psychiatric/Behavioral: Negative.   All other systems reviewed and are negative.   PHYSICAL EXAM: VS:  BP (!) 142/70 (BP Location: Left Arm, Patient Position: Sitting, Cuff Size: Normal)   Pulse 88   Ht 5\' 9"  (1.753 m)   Wt 204 lb (92.5 kg)   BMI 30.13 kg/m  , BMI Body mass index is 30.13 kg/m. Constitutional:  oriented to person, place, and time. No distress.  HENT:  Head: Normocephalic and atraumatic.  Eyes:  no discharge. No scleral icterus.  Neck: Normal range of motion. Neck supple. No JVD present.  Cardiovascular: Normal rate, regular rhythm, normal heart sounds and intact distal pulses. Exam reveals no gallop and no friction rub. No edema No murmur heard. Pulmonary/Chest: Moderately decreased breath sounds bilaterally, Rales at the bases  no stridor. No respiratory distress.  no wheezes.    no tenderness.  Abdominal: Soft.  no  distension.  no tenderness.  Musculoskeletal: Normal range of motion.  no  tenderness or deformity.  Neurological:  normal muscle tone. Coordination normal. No atrophy Skin: Skin is warm and dry. No rash noted. not diaphoretic.  Psychiatric:  normal mood and affect. behavior is normal. Thought content normal.    Recent Labs: 08/10/2017: ALT 13; Hemoglobin 12.0; Platelets 150.0 01/25/2018: BUN 28; Creatinine, Ser 1.08; Potassium 4.2; Sodium 145    Lipid Panel Lab Results  Component Value Date   CHOL 127 08/10/2017   HDL 76.90 08/10/2017   LDLCALC 33 08/10/2017   TRIG 84.0 08/10/2017      Wt Readings from Last 3 Encounters:  03/09/18 204 lb (92.5 kg)  01/25/18 208 lb (94.3 kg)  12/20/17 207 lb (93.9 kg)       ASSESSMENT AND PLAN:  Atherosclerosis of native coronary artery of native heart with stable angina pectoris (HCC) -  Recent echocardiogram and stress test reviewed with him in detail mid to distal anterior wall perfusion defect, no significant ischemia, suspect old infarct Mildly depressed ejection fraction 45% Discussed risk and benefit of additional workup such as cardiac catheterization As symptoms are stable, shortness of breath likely secondary to COPD, we will not plan for cardiac catheterization at this time  PVD (peripheral vascular disease) ([place]) - Plan: EKG 12-Lead Dilated aorta 4.2 cm Periodic CT scan for review  COPD Chronic secretions, also sinus drainage Uses nebulizers, sprays, continues to have problems worse in the morning Severe emphysema Followed by pulmonary  Shortness of breath Etiology likely multifactorial: Long discussion with him, echocardiogram with mildly depressed ejection fraction 45-50% Underlying COPD, chronic mucus in his throat and sinuses Suggestion of mildly elevated right heart pressures on echocardiogram, this was also suggested on previous CT scan -Long discussion concerning diuretics,  he is not particularly interested as he  has incontinence  In the future Lasix could be added as needed for any clear signs of fluid retention such as ankle swelling weight gain/  HYPERCHOLESTEROLEMIA, PURE - Plan: EKG 12-Lead Cholesterol is at goal on the current lipid regimen. No changes to the medications were made.  Essential hypertension - Plan: EKG 12-Lead Blood pressure is well controlled on today's visit. No changes made to the medications. Stable  OSA on CPAP -  Compliant on CPAP stable,  History of DVT (deep vein thrombosis) -  Has IVC filter  History of pulmonary embolism -  Filter in place  Ascending aortic aneurysm  currently 4.2 cm  in diameter, on CT scan 10/2016 Recent CT scan images reviewed, no significant change   Total encounter time more than 45 minutes  Greater than 50% was spent in counseling and coordination of care with the patient   Disposition:   F/U  6 months   No orders of the defined types were placed in this encounter.    Signed, Esmond Plants, M.D., Ph.D. 03/09/2018  [place], [place]

## 2018-03-09 ENCOUNTER — Ambulatory Visit (INDEPENDENT_AMBULATORY_CARE_PROVIDER_SITE_OTHER): Payer: Medicare Other | Admitting: Cardiovascular Disease

## 2018-03-09 ENCOUNTER — Encounter: Payer: Self-pay | Admitting: Cardiovascular Disease

## 2018-03-09 VITALS — BP 142/70 | HR 88 | Ht 69.0 in | Wt 204.0 lb

## 2018-03-09 DIAGNOSIS — R0602 Shortness of breath: Secondary | ICD-10-CM

## 2018-03-09 DIAGNOSIS — I5032 Chronic diastolic (congestive) heart failure: Secondary | ICD-10-CM | POA: Diagnosis not present

## 2018-03-09 DIAGNOSIS — I2510 Atherosclerotic heart disease of native coronary artery without angina pectoris: Secondary | ICD-10-CM

## 2018-03-09 DIAGNOSIS — I739 Peripheral vascular disease, unspecified: Secondary | ICD-10-CM | POA: Diagnosis not present

## 2018-03-09 DIAGNOSIS — I272 Pulmonary hypertension, unspecified: Secondary | ICD-10-CM

## 2018-03-09 DIAGNOSIS — I77810 Thoracic aortic ectasia: Secondary | ICD-10-CM | POA: Diagnosis not present

## 2018-03-09 DIAGNOSIS — Z86711 Personal history of pulmonary embolism: Secondary | ICD-10-CM | POA: Diagnosis not present

## 2018-03-09 DIAGNOSIS — E782 Mixed hyperlipidemia: Secondary | ICD-10-CM | POA: Diagnosis not present

## 2018-03-09 NOTE — Patient Instructions (Addendum)

## 2018-03-31 DIAGNOSIS — M1611 Unilateral primary osteoarthritis, right hip: Secondary | ICD-10-CM | POA: Diagnosis not present

## 2018-04-01 ENCOUNTER — Telehealth: Payer: Self-pay | Admitting: Cardiovascular Disease

## 2018-04-01 NOTE — Telephone Encounter (Signed)
I left a message for the patient to call- wanted to clarify that he is having no new symptoms.

## 2018-04-01 NOTE — Telephone Encounter (Signed)
I called and spoke with the patient. He states that he has had no change in symptoms or how he is feeling since he saw Dr. Rockey Situ on 03/09/18.  I advised I will forward to Dr. Rockey Situ to review for clearance.  The patient is agreeable.

## 2018-04-01 NOTE — Telephone Encounter (Signed)
Patient returning call.

## 2018-04-01 NOTE — Telephone Encounter (Signed)
   [facility] Pre-operative Risk Assessment    Request for surgical clearance:  1. What type of surgery is being performed? Right THA anterior  2. When is this surgery scheduled? 04/11/2018  3. What type of clearance is required (medical clearance vs. Pharmacy clearance to hold med vs. Both)? Not listed  4. Are there any medications that need to be held prior to surgery and how long? Not listed  5. Practice name and name of physician performing surgery? Dr. Kurtis Bushman, [facility]  6. What is your office phone number [phone]   7.   What is your office fax numbr [phone]  8.   Anesthesia type (None, local, MAC, general) ? general   Christopher Santiago 04/01/2018, 7:12 AM  _________________________________________________________________   (provider comments below)

## 2018-04-04 NOTE — Telephone Encounter (Signed)
Acceptable risk for surgery Recent stress test with no ischemia Would prefer he stay on low-dose aspirin if possible No further testing needed

## 2018-04-04 NOTE — Telephone Encounter (Signed)
Routed to number provided via EPIC fax.  

## 2018-04-06 ENCOUNTER — Encounter
Admission: RE | Admit: 2018-04-06 | Discharge: 2018-04-06 | Disposition: A | Discharge status: Home / Self Care | Payer: Medicare Other | Source: Ambulatory Visit | Attending: Orthopedic Surgery | Admitting: Orthopedic Surgery

## 2018-04-06 ENCOUNTER — Ambulatory Visit: Payer: Self-pay | Admitting: Orthopedic Surgery

## 2018-04-06 ENCOUNTER — Other Ambulatory Visit: Payer: Self-pay

## 2018-04-06 DIAGNOSIS — Z01818 Encounter for other preprocedural examination: Secondary | ICD-10-CM | POA: Insufficient documentation

## 2018-04-06 DIAGNOSIS — G4733 Obstructive sleep apnea (adult) (pediatric): Secondary | ICD-10-CM | POA: Diagnosis not present

## 2018-04-06 DIAGNOSIS — Z87891 Personal history of nicotine dependence: Secondary | ICD-10-CM | POA: Diagnosis not present

## 2018-04-06 DIAGNOSIS — I2510 Atherosclerotic heart disease of native coronary artery without angina pectoris: Secondary | ICD-10-CM | POA: Diagnosis not present

## 2018-04-06 DIAGNOSIS — M1611 Unilateral primary osteoarthritis, right hip: Secondary | ICD-10-CM | POA: Insufficient documentation

## 2018-04-06 DIAGNOSIS — F329 Major depressive disorder, single episode, unspecified: Secondary | ICD-10-CM | POA: Insufficient documentation

## 2018-04-06 DIAGNOSIS — E785 Hyperlipidemia, unspecified: Secondary | ICD-10-CM | POA: Diagnosis not present

## 2018-04-06 DIAGNOSIS — J449 Chronic obstructive pulmonary disease, unspecified: Secondary | ICD-10-CM | POA: Diagnosis not present

## 2018-04-06 DIAGNOSIS — I10 Essential (primary) hypertension: Secondary | ICD-10-CM | POA: Diagnosis not present

## 2018-04-06 DIAGNOSIS — E119 Type 2 diabetes mellitus without complications: Secondary | ICD-10-CM | POA: Insufficient documentation

## 2018-04-06 DIAGNOSIS — M169 Osteoarthritis of hip, unspecified: Secondary | ICD-10-CM | POA: Insufficient documentation

## 2018-04-06 LAB — TYPE AND SCREEN
ABO/RH(D): O POS
ANTIBODY SCREEN: NEGATIVE

## 2018-04-06 LAB — CBC
HCT: 36 % — ABNORMAL LOW (ref 40.0–52.0)
HEMOGLOBIN: 11.9 g/dL — AB (ref 13.0–18.0)
MCH: 31.4 pg (ref 26.0–34.0)
MCHC: 33.1 g/dL (ref 32.0–36.0)
MCV: 94.8 fL (ref 80.0–100.0)
Platelets: 149 10*3/uL — ABNORMAL LOW (ref 150–440)
RBC: 3.8 MIL/uL — AB (ref 4.40–5.90)
RDW: 14.2 % (ref 11.5–14.5)
WBC: 6.7 10*3/uL (ref 3.8–10.6)

## 2018-04-06 LAB — BASIC METABOLIC PANEL
ANION GAP: 8 (ref 5–15)
BUN: 33 mg/dL — ABNORMAL HIGH (ref 6–20)
CO2: 27 mmol/L (ref 22–32)
Calcium: 9.3 mg/dL (ref 8.9–10.3)
Chloride: 104 mmol/L (ref 101–111)
Creatinine, Ser: 1 mg/dL (ref 0.61–1.24)
Glucose, Bld: 96 mg/dL (ref 65–99)
POTASSIUM: 3.9 mmol/L (ref 3.5–5.1)
SODIUM: 139 mmol/L (ref 135–145)

## 2018-04-06 LAB — URINALYSIS, ROUTINE W REFLEX MICROSCOPIC
Bilirubin Urine: NEGATIVE
GLUCOSE, UA: NEGATIVE mg/dL
HGB URINE DIPSTICK: NEGATIVE
KETONES UR: NEGATIVE mg/dL
LEUKOCYTES UA: NEGATIVE
Nitrite: NEGATIVE
PH: 5 (ref 5.0–8.0)
Protein, ur: NEGATIVE mg/dL
Specific Gravity, Urine: 1.024 (ref 1.005–1.030)

## 2018-04-06 LAB — SURGICAL PCR SCREEN
MRSA, PCR: NEGATIVE
STAPHYLOCOCCUS AUREUS: NEGATIVE

## 2018-04-06 LAB — PROTIME-INR
INR: 1.06
Prothrombin Time: 13.7 seconds (ref 11.4–15.2)

## 2018-04-06 NOTE — Patient Instructions (Signed)
Your procedure is scheduled on:  Monday, Apr 11, 2018 Report to Day Surgery on the 2nd floor of the [facility]. To find out your arrival time, please call [phone] between 1PM - 3PM on: Friday, Apr 08, 2018  REMEMBER: Instructions that are not followed completely may result in serious medical risk, up to and including death; or upon the discretion of your surgeon and anesthesiologist your surgery may need to be rescheduled.  Do not eat food after midnight the night before your procedure.  No gum chewing, lozengers or hard candies.  You may however, drink CLEAR liquids up to 2 hours before you are scheduled to arrive for your surgery. Do not drink anything within 2 hours of the start of your surgery.  Clear liquids include: - water  - apple juice without pulp - clear gatorade - black coffee or tea (Do NOT add anything to the coffee or tea) Do NOT drink anything that is not on this list.  No Alcohol for 24 hours before or after surgery.  No Smoking including e-cigarettes for 24 hours prior to surgery.  No chewable tobacco products for at least 6 hours prior to surgery.  No nicotine patches on the day of surgery.  On the morning of surgery brush your teeth with toothpaste and water, you may rinse your mouth with mouthwash if you wish. Do not swallow any toothpaste or mouthwash.  Notify your doctor if there is any change in your medical condition (cold, fever, infection).  Do not wear jewelry, make-up, hairpins, clips or nail polish.  Do not wear lotions, powders, or perfumes. You may wear deodorant.  Do not shave 48 hours prior to surgery. Men may shave face and neck.  Contacts and dentures may not be worn into surgery.  Do not bring valuables to the hospital, including drivers license, insurance or credit cards.  [facility] is not responsible for any belongings or valuables.   TAKE THESE MEDICATIONS THE MORNING OF SURGERY:  1.  Albuterol inhaler 2.  Citalopram  (celexa) 3.  duoneb nebulizer 4.  Metoprolol 5.  tamsulosin (flomax)  Use CHG Soap as directed on instruction sheet.  Use inhalers on the day of surgery and bring to the hospital.  Bring your C-PAP to the hospital with you in case you may have to spend the night.   Follow recommendations from Cardiologist, Pulmonologist or PCP regarding stopping Aspirin.  NOW!  Stop Anti-inflammatories (NSAIDS) such as INDOMETHACIN, Advil, Aleve, Ibuprofen, Motrin, Naproxen, Naprosyn and Aspirin based products such as Excedrin, Goodys Powder, BC Powder. (May take Tylenol or Acetaminophen if needed.)  NOW!  Stop ANY OVER THE COUNTER supplements until after surgery.  Wear comfortable clothing (specific to your surgery type) to the hospital.  Plan for stool softeners for home use.  If you are being admitted to the hospital overnight, leave your suitcase in the car. After surgery it may be brought to your room.  Please call [phone] if you have any questions about these instructions.

## 2018-04-10 MED ORDER — CEFAZOLIN SODIUM-DEXTROSE 2-4 GM/100ML-% IV SOLN
2.0000 g | INTRAVENOUS | Status: AC
  Administered 2018-04-11: 2 g via INTRAVENOUS

## 2018-04-10 MED ORDER — TRANEXAMIC ACID 1000 MG/10ML IV SOLN
1000.0000 mg | INTRAVENOUS | Status: AC
  Administered 2018-04-11: 1000 mg via INTRAVENOUS
  Filled 2018-04-10: qty 1100

## 2018-04-11 ENCOUNTER — Inpatient Hospital Stay: Payer: Medicare Other

## 2018-04-11 ENCOUNTER — Inpatient Hospital Stay: Payer: Medicare Other | Admitting: Anesthesiology

## 2018-04-11 ENCOUNTER — Inpatient Hospital Stay
Admission: RE | Admit: 2018-04-11 | Discharge: 2018-04-13 | DRG: 470 | Disposition: A | Discharge status: Home / Self Care | Payer: Medicare Other | Source: Ambulatory Visit | Attending: Orthopedic Surgery | Admitting: Orthopedic Surgery

## 2018-04-11 ENCOUNTER — Encounter
Admission: RE | Disposition: A | Discharge status: Home / Self Care | Payer: Self-pay | Source: Ambulatory Visit | Attending: Orthopedic Surgery

## 2018-04-11 ENCOUNTER — Other Ambulatory Visit: Payer: Self-pay

## 2018-04-11 DIAGNOSIS — Z8679 Personal history of other diseases of the circulatory system: Secondary | ICD-10-CM

## 2018-04-11 DIAGNOSIS — J449 Chronic obstructive pulmonary disease, unspecified: Secondary | ICD-10-CM | POA: Diagnosis present

## 2018-04-11 DIAGNOSIS — I10 Essential (primary) hypertension: Secondary | ICD-10-CM | POA: Diagnosis not present

## 2018-04-11 DIAGNOSIS — Z79899 Other long term (current) drug therapy: Secondary | ICD-10-CM

## 2018-04-11 DIAGNOSIS — Z7951 Long term (current) use of inhaled steroids: Secondary | ICD-10-CM | POA: Diagnosis not present

## 2018-04-11 DIAGNOSIS — N401 Enlarged prostate with lower urinary tract symptoms: Secondary | ICD-10-CM | POA: Diagnosis not present

## 2018-04-11 DIAGNOSIS — R339 Retention of urine, unspecified: Secondary | ICD-10-CM

## 2018-04-11 DIAGNOSIS — Z87891 Personal history of nicotine dependence: Secondary | ICD-10-CM | POA: Diagnosis not present

## 2018-04-11 DIAGNOSIS — F329 Major depressive disorder, single episode, unspecified: Secondary | ICD-10-CM | POA: Diagnosis not present

## 2018-04-11 DIAGNOSIS — Z96641 Presence of right artificial hip joint: Secondary | ICD-10-CM | POA: Diagnosis not present

## 2018-04-11 DIAGNOSIS — Z888 Allergy status to other drugs, medicaments and biological substances status: Secondary | ICD-10-CM

## 2018-04-11 DIAGNOSIS — E1151 Type 2 diabetes mellitus with diabetic peripheral angiopathy without gangrene: Secondary | ICD-10-CM | POA: Diagnosis present

## 2018-04-11 DIAGNOSIS — Z9989 Dependence on other enabling machines and devices: Secondary | ICD-10-CM | POA: Diagnosis not present

## 2018-04-11 DIAGNOSIS — G4733 Obstructive sleep apnea (adult) (pediatric): Secondary | ICD-10-CM | POA: Diagnosis present

## 2018-04-11 DIAGNOSIS — I272 Pulmonary hypertension, unspecified: Secondary | ICD-10-CM | POA: Diagnosis present

## 2018-04-11 DIAGNOSIS — E785 Hyperlipidemia, unspecified: Secondary | ICD-10-CM | POA: Diagnosis present

## 2018-04-11 DIAGNOSIS — Z471 Aftercare following joint replacement surgery: Secondary | ICD-10-CM | POA: Diagnosis not present

## 2018-04-11 DIAGNOSIS — Z09 Encounter for follow-up examination after completed treatment for conditions other than malignant neoplasm: Secondary | ICD-10-CM

## 2018-04-11 DIAGNOSIS — I2510 Atherosclerotic heart disease of native coronary artery without angina pectoris: Secondary | ICD-10-CM | POA: Diagnosis present

## 2018-04-11 DIAGNOSIS — R338 Other retention of urine: Secondary | ICD-10-CM | POA: Diagnosis not present

## 2018-04-11 DIAGNOSIS — E039 Hypothyroidism, unspecified: Secondary | ICD-10-CM | POA: Diagnosis present

## 2018-04-11 DIAGNOSIS — Z95828 Presence of other vascular implants and grafts: Secondary | ICD-10-CM | POA: Diagnosis not present

## 2018-04-11 DIAGNOSIS — Z419 Encounter for procedure for purposes other than remedying health state, unspecified: Secondary | ICD-10-CM

## 2018-04-11 DIAGNOSIS — I739 Peripheral vascular disease, unspecified: Secondary | ICD-10-CM | POA: Diagnosis present

## 2018-04-11 DIAGNOSIS — Z86711 Personal history of pulmonary embolism: Secondary | ICD-10-CM

## 2018-04-11 DIAGNOSIS — M1611 Unilateral primary osteoarthritis, right hip: Principal | ICD-10-CM | POA: Diagnosis present

## 2018-04-11 DIAGNOSIS — Z7952 Long term (current) use of systemic steroids: Secondary | ICD-10-CM

## 2018-04-11 DIAGNOSIS — M1610 Unilateral primary osteoarthritis, unspecified hip: Secondary | ICD-10-CM | POA: Diagnosis not present

## 2018-04-11 HISTORY — PX: TOTAL HIP ARTHROPLASTY: SHX124

## 2018-04-11 LAB — GLUCOSE, CAPILLARY
Glucose-Capillary: 104 mg/dL — ABNORMAL HIGH (ref 65–99)
Glucose-Capillary: 94 mg/dL (ref 65–99)

## 2018-04-11 LAB — ABO/RH: ABO/RH(D): O POS

## 2018-04-11 SURGERY — ARTHROPLASTY, HIP, TOTAL, ANTERIOR APPROACH
Anesthesia: Spinal | Site: Hip | Laterality: Right | Wound class: Clean

## 2018-04-11 MED ORDER — BACITRACIN 50000 UNITS IM SOLR
INTRAMUSCULAR | Status: AC
  Filled 2018-04-11: qty 2

## 2018-04-11 MED ORDER — ACETAMINOPHEN 500 MG PO TABS
500.0000 mg | ORAL_TABLET | Freq: Four times a day (QID) | ORAL | Status: AC
  Administered 2018-04-11 – 2018-04-12 (×4): 500 mg via ORAL
  Filled 2018-04-11 (×4): qty 1

## 2018-04-11 MED ORDER — GABAPENTIN 300 MG PO CAPS
300.0000 mg | ORAL_CAPSULE | Freq: Three times a day (TID) | ORAL | Status: DC
  Administered 2018-04-11 – 2018-04-13 (×5): 300 mg via ORAL
  Filled 2018-04-11 (×5): qty 1

## 2018-04-11 MED ORDER — PHENYLEPHRINE HCL 10 MG/ML IJ SOLN
INTRAMUSCULAR | Status: AC
  Filled 2018-04-11: qty 1

## 2018-04-11 MED ORDER — ACETAMINOPHEN 325 MG PO TABS: 325.0000 mg | ORAL_TABLET | Freq: Four times a day (QID) | ORAL | Status: DC | PRN

## 2018-04-11 MED ORDER — CITALOPRAM HYDROBROMIDE 20 MG PO TABS
60.0000 mg | ORAL_TABLET | Freq: Every day | ORAL | Status: DC
  Administered 2018-04-11 – 2018-04-12 (×2): 60 mg via ORAL
  Filled 2018-04-11 (×3): qty 3

## 2018-04-11 MED ORDER — GLYCOPYRROLATE 0.2 MG/ML IJ SOLN
INTRAMUSCULAR | Status: DC | PRN
  Administered 2018-04-11: 0.2 mg via INTRAVENOUS

## 2018-04-11 MED ORDER — MORPHINE SULFATE (PF) 2 MG/ML IV SOLN: 0.5000 mg | INTRAVENOUS | Status: DC | PRN

## 2018-04-11 MED ORDER — METOCLOPRAMIDE HCL 10 MG PO TABS: 5.0000 mg | ORAL_TABLET | Freq: Three times a day (TID) | ORAL | Status: DC | PRN

## 2018-04-11 MED ORDER — FENTANYL CITRATE (PF) 100 MCG/2ML IJ SOLN
INTRAMUSCULAR | Status: AC
  Filled 2018-04-11: qty 2

## 2018-04-11 MED ORDER — CEFAZOLIN SODIUM-DEXTROSE 2-4 GM/100ML-% IV SOLN
INTRAVENOUS | Status: AC
  Filled 2018-04-11: qty 100

## 2018-04-11 MED ORDER — METOCLOPRAMIDE HCL 5 MG/ML IJ SOLN: 5.0000 mg | Freq: Three times a day (TID) | INTRAMUSCULAR | Status: DC | PRN

## 2018-04-11 MED ORDER — ACETAMINOPHEN 500 MG PO TABS
ORAL_TABLET | ORAL | Status: AC
  Administered 2018-04-11: 1000 mg via ORAL
  Filled 2018-04-11: qty 2

## 2018-04-11 MED ORDER — BUPIVACAINE-EPINEPHRINE (PF) 0.25% -1:200000 IJ SOLN
INTRAMUSCULAR | Status: AC
  Filled 2018-04-11: qty 30

## 2018-04-11 MED ORDER — EPHEDRINE SULFATE 50 MG/ML IJ SOLN
INTRAMUSCULAR | Status: DC | PRN
  Administered 2018-04-11: 10 mg via INTRAVENOUS

## 2018-04-11 MED ORDER — ONDANSETRON HCL 4 MG PO TABS: 4.0000 mg | ORAL_TABLET | Freq: Four times a day (QID) | ORAL | Status: DC | PRN

## 2018-04-11 MED ORDER — ONDANSETRON HCL 4 MG/2ML IJ SOLN: 4.0000 mg | Freq: Once | INTRAMUSCULAR | Status: DC | PRN

## 2018-04-11 MED ORDER — ALBUTEROL SULFATE HFA 108 (90 BASE) MCG/ACT IN AERS: 1.0000 | INHALATION_SPRAY | Freq: Four times a day (QID) | RESPIRATORY_TRACT | Status: DC | PRN

## 2018-04-11 MED ORDER — PROPOFOL 10 MG/ML IV BOLUS
INTRAVENOUS | Status: DC | PRN
  Administered 2018-04-11 (×3): 18 mg via INTRAVENOUS
  Administered 2018-04-11: 30 mg via INTRAVENOUS
  Administered 2018-04-11: 20 mg via INTRAVENOUS

## 2018-04-11 MED ORDER — ONDANSETRON HCL 4 MG/2ML IJ SOLN: 4.0000 mg | Freq: Four times a day (QID) | INTRAMUSCULAR | Status: DC | PRN

## 2018-04-11 MED ORDER — FENTANYL CITRATE (PF) 100 MCG/2ML IJ SOLN: 25.0000 ug | INTRAMUSCULAR | Status: DC | PRN

## 2018-04-11 MED ORDER — GABAPENTIN 300 MG PO CAPS
300.0000 mg | ORAL_CAPSULE | Freq: Once | ORAL | Status: AC
  Administered 2018-04-11: 300 mg via ORAL

## 2018-04-11 MED ORDER — SODIUM CHLORIDE 0.9 % IV SOLN
INTRAVENOUS | Status: DC
  Administered 2018-04-11: 1000 mL via INTRAVENOUS
  Administered 2018-04-11: 09:00:00 via INTRAVENOUS

## 2018-04-11 MED ORDER — HYDROCODONE-ACETAMINOPHEN 5-325 MG PO TABS
1.0000 | ORAL_TABLET | ORAL | Status: DC | PRN
  Administered 2018-04-11: 2 via ORAL
  Filled 2018-04-11: qty 2

## 2018-04-11 MED ORDER — PROPOFOL 500 MG/50ML IV EMUL
INTRAVENOUS | Status: DC | PRN
  Administered 2018-04-11: 40 ug/kg/min via INTRAVENOUS

## 2018-04-11 MED ORDER — TRAMADOL HCL 50 MG PO TABS
50.0000 mg | ORAL_TABLET | Freq: Four times a day (QID) | ORAL | Status: DC
  Administered 2018-04-11 – 2018-04-13 (×6): 50 mg via ORAL
  Filled 2018-04-11 (×6): qty 1

## 2018-04-11 MED ORDER — BISACODYL 10 MG RE SUPP
10.0000 mg | Freq: Every day | RECTAL | Status: DC | PRN
  Administered 2018-04-13: 10 mg via RECTAL
  Filled 2018-04-11: qty 1

## 2018-04-11 MED ORDER — SODIUM CHLORIDE 0.9 % IV SOLN
INTRAVENOUS | Status: DC | PRN
  Administered 2018-04-11: 20 ug/min via INTRAVENOUS

## 2018-04-11 MED ORDER — ACETAMINOPHEN 500 MG PO TABS
1000.0000 mg | ORAL_TABLET | Freq: Once | ORAL | Status: AC
  Administered 2018-04-11: 1000 mg via ORAL

## 2018-04-11 MED ORDER — TAMSULOSIN HCL 0.4 MG PO CAPS
0.4000 mg | ORAL_CAPSULE | Freq: Every day | ORAL | Status: DC
  Administered 2018-04-11 – 2018-04-13 (×3): 0.4 mg via ORAL
  Filled 2018-04-11 (×4): qty 1

## 2018-04-11 MED ORDER — BUPIVACAINE HCL (PF) 0.5 % IJ SOLN
INTRAMUSCULAR | Status: DC | PRN
  Administered 2018-04-11: 3 mL

## 2018-04-11 MED ORDER — METOPROLOL TARTRATE 25 MG PO TABS
25.0000 mg | ORAL_TABLET | Freq: Two times a day (BID) | ORAL | Status: DC
  Administered 2018-04-11 – 2018-04-13 (×3): 25 mg via ORAL
  Filled 2018-04-11 (×5): qty 1

## 2018-04-11 MED ORDER — PROPOFOL 500 MG/50ML IV EMUL
INTRAVENOUS | Status: AC
  Filled 2018-04-11: qty 50

## 2018-04-11 MED ORDER — BUPIVACAINE-EPINEPHRINE (PF) 0.25% -1:200000 IJ SOLN
INTRAMUSCULAR | Status: DC | PRN
  Administered 2018-04-11: 20 mL

## 2018-04-11 MED ORDER — FENTANYL CITRATE (PF) 100 MCG/2ML IJ SOLN
INTRAMUSCULAR | Status: DC | PRN
  Administered 2018-04-11: 75 ug via INTRAVENOUS
  Administered 2018-04-11: 25 ug via INTRAVENOUS

## 2018-04-11 MED ORDER — METOPROLOL TARTRATE 25 MG PO TABS
ORAL_TABLET | ORAL | Status: AC
  Filled 2018-04-11: qty 1

## 2018-04-11 MED ORDER — ALBUTEROL SULFATE (2.5 MG/3ML) 0.083% IN NEBU: 2.5000 mg | INHALATION_SOLUTION | Freq: Four times a day (QID) | RESPIRATORY_TRACT | Status: DC | PRN

## 2018-04-11 MED ORDER — PHENOL 1.4 % MT LIQD: 1.0000 | OROMUCOSAL | Status: DC | PRN

## 2018-04-11 MED ORDER — LACTATED RINGERS IV SOLN
INTRAVENOUS | Status: DC
  Administered 2018-04-11 – 2018-04-12 (×3): via INTRAVENOUS

## 2018-04-11 MED ORDER — LIDOCAINE HCL (PF) 2 % IJ SOLN
INTRAMUSCULAR | Status: AC
  Filled 2018-04-11: qty 10

## 2018-04-11 MED ORDER — BUPIVACAINE HCL (PF) 0.5 % IJ SOLN
INTRAMUSCULAR | Status: AC
  Filled 2018-04-11: qty 10

## 2018-04-11 MED ORDER — CHLORHEXIDINE GLUCONATE 4 % EX LIQD: 60.0000 mL | Freq: Once | CUTANEOUS | Status: DC

## 2018-04-11 MED ORDER — FAMOTIDINE 20 MG PO TABS
20.0000 mg | ORAL_TABLET | Freq: Once | ORAL | Status: AC
  Administered 2018-04-11: 20 mg via ORAL

## 2018-04-11 MED ORDER — SODIUM CHLORIDE FLUSH 0.9 % IV SOLN
INTRAVENOUS | Status: AC
Start: 2018-04-11 — End: ?
  Filled 2018-04-11: qty 20

## 2018-04-11 MED ORDER — DOCUSATE SODIUM 100 MG PO CAPS
100.0000 mg | ORAL_CAPSULE | Freq: Two times a day (BID) | ORAL | Status: DC
  Administered 2018-04-11 – 2018-04-13 (×5): 100 mg via ORAL
  Filled 2018-04-11 (×5): qty 1

## 2018-04-11 MED ORDER — ASPIRIN EC 325 MG PO TBEC
325.0000 mg | DELAYED_RELEASE_TABLET | Freq: Every day | ORAL | Status: DC
  Administered 2018-04-12 – 2018-04-13 (×2): 325 mg via ORAL
  Filled 2018-04-11 (×2): qty 1

## 2018-04-11 MED ORDER — MENTHOL 3 MG MT LOZG: 1.0000 | LOZENGE | OROMUCOSAL | Status: DC | PRN

## 2018-04-11 MED ORDER — HYDROCODONE-ACETAMINOPHEN 7.5-325 MG PO TABS: 1.0000 | ORAL_TABLET | ORAL | Status: DC | PRN

## 2018-04-11 MED ORDER — GABAPENTIN 300 MG PO CAPS
ORAL_CAPSULE | ORAL | Status: AC
  Administered 2018-04-11: 300 mg via ORAL
  Filled 2018-04-11: qty 1

## 2018-04-11 MED ORDER — VITAMIN D 1000 UNITS PO TABS
2000.0000 [IU] | ORAL_TABLET | Freq: Every day | ORAL | Status: DC
  Administered 2018-04-12 – 2018-04-13 (×2): 2000 [IU] via ORAL
  Filled 2018-04-11 (×2): qty 2

## 2018-04-11 MED ORDER — MAGNESIUM CITRATE PO SOLN: 1.0000 | Freq: Once | ORAL | Status: DC | PRN

## 2018-04-11 MED ORDER — CEFAZOLIN SODIUM-DEXTROSE 2-4 GM/100ML-% IV SOLN
2.0000 g | Freq: Four times a day (QID) | INTRAVENOUS | Status: AC
  Administered 2018-04-11 (×2): 2 g via INTRAVENOUS
  Filled 2018-04-11 (×2): qty 100

## 2018-04-11 MED ORDER — KETOROLAC TROMETHAMINE 15 MG/ML IJ SOLN
7.5000 mg | Freq: Four times a day (QID) | INTRAMUSCULAR | Status: AC
  Administered 2018-04-11 – 2018-04-12 (×4): 7.5 mg via INTRAVENOUS
  Filled 2018-04-11 (×4): qty 1

## 2018-04-11 MED ORDER — SIMVASTATIN 20 MG PO TABS
20.0000 mg | ORAL_TABLET | Freq: Every day | ORAL | Status: DC
  Administered 2018-04-11 – 2018-04-12 (×2): 20 mg via ORAL
  Filled 2018-04-11 (×2): qty 1

## 2018-04-11 MED ORDER — ALLOPURINOL 100 MG PO TABS
100.0000 mg | ORAL_TABLET | Freq: Every day | ORAL | Status: DC
  Administered 2018-04-11 – 2018-04-13 (×3): 100 mg via ORAL
  Filled 2018-04-11 (×3): qty 1

## 2018-04-11 MED ORDER — SODIUM CHLORIDE 0.9 % IR SOLN
Status: DC | PRN
  Administered 2018-04-11: 08:00:00

## 2018-04-11 MED ORDER — FAMOTIDINE 20 MG PO TABS
ORAL_TABLET | ORAL | Status: AC
  Administered 2018-04-11: 20 mg via ORAL
  Filled 2018-04-11: qty 1

## 2018-04-11 MED ORDER — IPRATROPIUM-ALBUTEROL 0.5-2.5 (3) MG/3ML IN SOLN: 3.0000 mL | Freq: Three times a day (TID) | RESPIRATORY_TRACT | Status: DC

## 2018-04-11 MED ORDER — METOPROLOL TARTRATE 25 MG PO TABS
25.0000 mg | ORAL_TABLET | Freq: Once | ORAL | Status: AC
  Administered 2018-04-11: 25 mg via ORAL

## 2018-04-11 SURGICAL SUPPLY — 56 items
BLADE SAGITTAL WIDE XTHICK NO (BLADE) ×3 IMPLANT
BRUSH SCRUB EZ  4% CHG (MISCELLANEOUS) ×4
BRUSH SCRUB EZ 4% CHG (MISCELLANEOUS) ×2 IMPLANT
CATH COUDE FOLEY 2W 5CC 16FR (CATHETERS) ×2 IMPLANT
CHLORAPREP W/TINT 26ML (MISCELLANEOUS) ×3 IMPLANT
COVER HOLE (Hips) ×2 IMPLANT
DRAPE C-ARM 42X72 X-RAY (DRAPES) ×3 IMPLANT
DRAPE SHEET LG 3/4 BI-LAMINATE (DRAPES) ×6 IMPLANT
DRAPE STERI IOBAN 125X83 (DRAPES) ×3 IMPLANT
DRSG AQUACEL AG ADV 3.5X10 (GAUZE/BANDAGES/DRESSINGS) ×1 IMPLANT
DRSG AQUACEL AG ADV 3.5X14 (GAUZE/BANDAGES/DRESSINGS) ×3 IMPLANT
ELECT BLADE 6.5 EXT (BLADE) ×3 IMPLANT
ELECT REM PT RETURN 9FT ADLT (ELECTROSURGICAL) ×3
ELECTRODE REM PT RTRN 9FT ADLT (ELECTROSURGICAL) ×1 IMPLANT
GAUZE PETRO XEROFOAM 1X8 (MISCELLANEOUS) ×3 IMPLANT
GLOVE BIOGEL PI IND STRL 7.0 (GLOVE) IMPLANT
GLOVE BIOGEL PI INDICATOR 7.0 (GLOVE) ×8
GLOVE INDICATOR 8.0 STRL GRN (GLOVE) ×3 IMPLANT
GLOVE SURG ORTHO 8.0 STRL STRW (GLOVE) ×3 IMPLANT
GOWN STRL REUS W/ TWL LRG LVL3 (GOWN DISPOSABLE) ×2 IMPLANT
GOWN STRL REUS W/ TWL XL LVL3 (GOWN DISPOSABLE) ×1 IMPLANT
GOWN STRL REUS W/TWL LRG LVL3 (GOWN DISPOSABLE) ×9
GOWN STRL REUS W/TWL XL LVL3 (GOWN DISPOSABLE) ×3
HANDLE YANKAUER SUCT BULB TIP (MISCELLANEOUS) ×2 IMPLANT
HEAD FEMORAL TAPER 36MM P0 (Head) ×2 IMPLANT
HOLDER FOLEY CATH W/STRAP (MISCELLANEOUS) ×2 IMPLANT
HOOD PEEL AWAY FLYTE STAYCOOL (MISCELLANEOUS) ×9 IMPLANT
IV NS 1000ML (IV SOLUTION) ×3
IV NS 1000ML BAXH (IV SOLUTION) ×1 IMPLANT
KIT PATIENT CARE HANA TABLE (KITS) ×3 IMPLANT
KIT TURNOVER CYSTO (KITS) ×3 IMPLANT
LINER ACETABULAR 36X56 OD (Liner) ×2 IMPLANT
MAT BLUE FLOOR 46X72 FLO (MISCELLANEOUS) ×3 IMPLANT
NDL FILTER BLUNT 18X1 1/2 (NEEDLE) IMPLANT
NDL SAFETY ECLIPSE 18X1.5 (NEEDLE) ×2 IMPLANT
NDL SPNL 20GX3.5 QUINCKE YW (NEEDLE) ×1 IMPLANT
NEEDLE FILTER BLUNT 18X 1/2SAF (NEEDLE) ×2
NEEDLE FILTER BLUNT 18X1 1/2 (NEEDLE) ×1 IMPLANT
NEEDLE HYPO 18GX1.5 SHARP (NEEDLE) ×6
NEEDLE HYPO 22GX1.5 SAFETY (NEEDLE) ×3 IMPLANT
NEEDLE SPNL 20GX3.5 QUINCKE YW (NEEDLE) ×3 IMPLANT
PACK HIP PROSTHESIS (MISCELLANEOUS) ×3 IMPLANT
PADDING CAST BLEND 4X4 NS (MISCELLANEOUS) ×6 IMPLANT
PILLOW ABDUCTION MEDIUM (MISCELLANEOUS) ×3 IMPLANT
PULSAVAC PLUS IRRIG FAN TIP (DISPOSABLE) ×3
SCREW 6.5X25MM (Screw) ×2 IMPLANT
SHELL USE W/LINR OD 56MM (Shell) ×2 IMPLANT
STAPLER SKIN PROX 35W (STAPLE) ×3 IMPLANT
STEM LATERAL COLLAR SZ LAT 3 (Stem) ×2 IMPLANT
SUT BONE WAX W31G (SUTURE) ×1 IMPLANT
SUT DVC 2 QUILL PDO  T11 36X36 (SUTURE) ×2
SUT DVC 2 QUILL PDO T11 36X36 (SUTURE) ×1 IMPLANT
SUT VIC AB 2-0 CT1 18 (SUTURE) ×3 IMPLANT
SYR 20CC LL (SYRINGE) ×3 IMPLANT
TIP FAN IRRIG PULSAVAC PLUS (DISPOSABLE) ×1 IMPLANT
TRAY FOLEY CATH SILVER 16FR LF (SET/KITS/TRAYS/PACK) ×2 IMPLANT

## 2018-04-11 NOTE — Anesthesia Post-op Follow-up Note (Signed)
Anesthesia QCDR form completed.        

## 2018-04-11 NOTE — Anesthesia Procedure Notes (Signed)
Spinal  Patient location during procedure: OR Start time: 04/11/2018 7:36 AM End time: 04/11/2018 7:41 AM Staffing Resident/CRNA: Bernardo Heater, CRNA Performed: resident/CRNA  Preanesthetic Checklist Completed: patient identified, site marked, surgical consent, pre-op evaluation, timeout performed, IV checked, risks and benefits discussed and monitors and equipment checked Spinal Block Patient position: sitting Prep: Betadine and ChloraPrep Patient monitoring: heart rate, continuous pulse ox, blood pressure and cardiac monitor Approach: midline Location: L3-4 Injection technique: single-shot Needle Needle type: Introducer and Pencan  Needle gauge: 24 G Needle length: 9 cm Additional Notes Negative paresthesia. Negative blood return. Positive free-flowing CSF. Expiration date of kit checked and confirmed. Patient tolerated procedure well, without complications.

## 2018-04-11 NOTE — Evaluation (Signed)
Physical Therapy Evaluation Patient Details Name: Christopher Santiago MRN: [id number] DOB: Jun 23, 1931 Today's Date: 04/11/2018   History of Present Illness  Patient is an 84 year old male admitted s/p R anterior THA. PMH includes COPD, trimalleolar fracture, PE, Htn, CAD and AAA.  Clinical Impression  Patient is an 84 year old male who lives in a one story home with his wife.  Pt is independent at baseline without use of AD.  He is in bed upon PT arrival and reports 4/10 pain.  Pt is able to perform bed mobility mod I and sit at EOB with good balance.  Able to perform STS with supervision for use of RW and gradual wt shift to R side.  Pt walked 5 ft to recliner and was able to turn and sit in chair with VC's for sequencing for management of RW.  PT introduced anterior hip HEP and pt was able to complete there ex with min VC's.  Education was provided regarding WB and hip precautions and pt expressed understanding.  Pt will continue to benefit from skilled PT with focus on strength, functional mobility, pain management, use of AD and step training.    Follow Up Recommendations Home health PT    Equipment Recommendations  Rolling walker with 5" wheels    Recommendations for Other Services       Precautions / Restrictions Precautions Precautions: Anterior Hip Restrictions Weight Bearing Restrictions: Yes RLE Weight Bearing: Weight bearing as tolerated      Mobility  Bed Mobility Overal bed mobility: Modified Independent             General bed mobility comments: Increased time and use of bedrail.  Transfers Overall transfer level: Needs assistance Equipment used: Rolling walker (2 wheeled) Transfers: Sit to/from Stand Sit to Stand: Supervision         General transfer comment: Able to perform STS without physical assist.  VC's provided for use of RW.  Ambulation/Gait Ambulation/Gait assistance: Supervision Ambulation Distance (Feet): 5 Feet Assistive device: Rolling walker  (2 wheeled)     Gait velocity interpretation: <1.8 ft/sec, indicate of risk for recurrent falls General Gait Details: step to gait with use of RW and VC's for use of RW and sequencing.  Able to gradually increase WB on R LE with report of increased pain.  Stairs            Wheelchair Mobility    Modified Rankin (Stroke Patients Only)       Balance Overall balance assessment: Modified Independent                                           Pertinent Vitals/Pain Pain Assessment: 0-10 Pain Score: 4  Pain Intervention(s): Premedicated before session    [place] expects to be discharged to:: Private residence Living Arrangements: Spouse/significant other Available Help at Discharge: Family;Available 24 hours/day Type of Home: House Home Access: Stairs to enter Entrance Stairs-Rails: Can reach both Entrance Stairs-Number of Steps: 4 Home Layout: One level(Has a basement which is not necessary to access.) Home Equipment: Toilet riser;Cane - single point;Shower seat - built in;Grab bars - tub/shower      Prior Function Level of Independence: Independent         Comments: States that he was indepdendent with everything; he just had to do it slowly.     Hand Dominance  Dominant Hand: Right    Extremity/Trunk Assessment   Upper Extremity Assessment Upper Extremity Assessment: Overall WFL for tasks assessed(Grossly 4/5 bilaterally.)    Lower Extremity Assessment Lower Extremity Assessment: Overall WFL for tasks assessed(Grossly 4/5 bilaterally with exception of R hip.)    Cervical / Trunk Assessment Cervical / Trunk Assessment: Normal  Communication   Communication: Expressive difficulties  Cognition Arousal/Alertness: Awake/alert Behavior During Therapy: WFL for tasks assessed/performed Overall Cognitive Status: Within Functional Limits for tasks assessed                                 General Comments:  A&O x4.  Able to follow commands consistently.      General Comments      Exercises Total Joint Exercises Ankle Circles/Pumps: 20 reps;Both;Seated Quad Sets: Strengthening;Right;10 reps;Seated Heel Slides: AROM;Right;5 reps;Seated   Assessment/Plan    PT Assessment Patient needs continued PT services  PT Problem List Decreased strength;Decreased mobility;Decreased balance;Decreased knowledge of use of DME       PT Treatment Interventions DME instruction;Therapeutic activities;Gait training;Therapeutic exercise;Patient/family education;Stair training;Balance training;Functional mobility training;Neuromuscular re-education    PT Goals (Current goals can be found in the Care Plan section)  Acute Rehab PT Goals Patient Stated Goal: To return home and continue to take care of his wife. PT Goal Formulation: With patient Time For Goal Achievement: 04/25/18 Potential to Achieve Goals: Good    Frequency BID   Barriers to discharge        Co-evaluation               AM-PAC PT "6 Clicks" Daily Activity  Outcome Measure Difficulty turning over in bed (including adjusting bedclothes, sheets and blankets)?: A Little Difficulty moving from lying on back to sitting on the side of the bed? : A Little Difficulty sitting down on and standing up from a chair with arms (e.g., wheelchair, bedside commode, etc,.)?: A Little Help needed moving to and from a bed to chair (including a wheelchair)?: A Little Help needed walking in hospital room?: A Little Help needed climbing 3-5 steps with a railing? : A Little 6 Click Score: 18    End of Session Equipment Utilized During Treatment: Gait belt Activity Tolerance: Patient tolerated treatment well Patient left: in chair;with call bell/phone within reach;with chair alarm set   PT Visit Diagnosis: Unsteadiness on feet (R26.81);Pain Pain - Right/Left: Right Pain - part of body: Hip    Time: 1600-1630 PT Time Calculation (min) (ACUTE  ONLY): 30 min   Charges:   PT Evaluation $PT Eval Low Complexity: 1 Low PT Treatments $Therapeutic Exercise: 8-22 mins   PT G Codes:   PT G-Codes **NOT FOR INPATIENT CLASS** Functional Assessment Tool Used: AM-PAC 6 Clicks Basic Mobility    Roxanne Gates, PT, DPT   Roxanne Gates 04/11/2018, 4:55 PM

## 2018-04-11 NOTE — Progress Notes (Signed)
Pt stated if he needs nebulizer treatment he will call. Pt assessed, lungs clear, no shortness of breath noted, sats 98% on roomair, respiratory rate 16/min. CPAP unit checked, pt states he wears every night.

## 2018-04-11 NOTE — H&P (Signed)
The patient has been re-examined, and the chart reviewed, and there have been no interval changes to the documented history and physical.  Plan a right total hip replacement today.  Anesthesia is not consulted regarding a peripheral nerve block for post-operative pain.  The risks, benefits, and alternatives have been discussed at length, and the patient is willing to proceed.

## 2018-04-11 NOTE — Anesthesia Preprocedure Evaluation (Signed)
Anesthesia Evaluation  Patient identified by MRN, date of birth, ID band Patient awake    Reviewed: Allergy & Precautions, NPO status , Patient's Chart, lab work & pertinent test results, reviewed documented beta blocker date and time   History of Anesthesia Complications Negative for: history of anesthetic complications  Airway Mallampati: II       Dental   Pulmonary sleep apnea and Continuous Positive Airway Pressure Ventilation , COPD,  COPD inhaler, former smoker,           Cardiovascular hypertension, Pt. on medications and Pt. on home beta blockers + CAD and + Peripheral Vascular Disease  (-) dysrhythmias (-) Valvular Problems/Murmurs  S/p PE   Neuro/Psych neg Seizures Depression    GI/Hepatic Neg liver ROS, neg GERD  ,  Endo/Other  diabetes, Type 2, Oral Hypoglycemic AgentsHypothyroidism   Renal/GU negative Renal ROS     Musculoskeletal   Abdominal   Peds  Hematology   Anesthesia Other Findings   Reproductive/Obstetrics                             Anesthesia Physical Anesthesia Plan  ASA: III  Anesthesia Plan: Spinal   Post-op Pain Management:    Induction:   PONV Risk Score and Plan: 1 and Propofol infusion and Treatment may vary due to age or medical condition  Airway Management Planned: Nasal Cannula  Additional Equipment:   Intra-op Plan:   Post-operative Plan:   Informed Consent: I have reviewed the patients History and Physical, chart, labs and discussed the procedure including the risks, benefits and alternatives for the proposed anesthesia with the patient or authorized representative who has indicated his/her understanding and acceptance.     Plan Discussed with:   Anesthesia Plan Comments:         Anesthesia Quick Evaluation

## 2018-04-11 NOTE — NC FL2 (Signed)
[place] LEVEL OF CARE SCREENING TOOL     IDENTIFICATION  Patient Name: Christopher Santiago Birthdate: 07-07-1931 Sex: male Admission Date (Current Location): 04/11/2018  [place] and [place] Number:  Engineering geologist and Address:  [facility], [address] [place], [place] [address]      Provider Number: [id number]  Attending Physician Name and Address:  Lovell Sheehan, MD  Relative Name and Phone Number:       Current Level of Care: Hospital Recommended Level of Care: [place] Prior Approval Number:    Date Approved/Denied:   PASRR Number: ([id number] )  Discharge Plan: SNF    Current Diagnoses: Patient Active Problem List   Diagnosis Date Noted  . Osteoarthritis of right hip 04/11/2018  . Ascending aorta dilation (HCC) 03/09/2018  . Pulmonary hypertension, unspecified ([place]) 03/09/2018  . Chronic diastolic CHF (congestive heart failure) ([place]) 03/09/2018  . Fall 12/01/2017  . Pain of right hip joint 08/12/2017  . History of DVT (deep vein thrombosis) 02/07/2017  . History of pulmonary embolism 02/07/2017  . RUL lung Opacity 09/22/2016  . Chronic cough 06/08/2016  . Concussion with no loss of consciousness 02/20/2016  . Alcohol use 02/20/2016  . HLD (hyperlipidemia) 02/10/2016  . Lower urinary tract symptoms (LUTS) 10/31/2015  . SOB (shortness of breath) 08/26/2015  . Bradycardia 08/26/2015  . Cavitating mass of lung 12/16/2014  . Acute pulmonary embolism ([place]) 11/19/2014  . Diarrhea 10/26/2013  . Laceration of finger 03/15/2013  . Cramps, extremity 03/15/2013  . COPD (chronic obstructive pulmonary disease) ([place]) 12/27/2012  . Cough 08/23/2012  . Biceps tendonitis 07/12/2012  . Medicare annual wellness visit, subsequent 02/04/2012  . Advance directive discussed with patient 02/04/2012  . PVD (peripheral vascular disease) ([place]) 12/22/2011  . CHRONIC RHINITIS 01/29/2011  . CERVICAL STRAIN, ACUTE  04/08/2010  . FRACTURE, ANKLE, LEFT TRIMALLEOLAR 08/27/2009  . TESTICULAR HYPOFUNCTION 05/06/2009  . Hypothyroidism 01/17/2009  . CARCINOMA, SKIN, SQUAMOUS CELL 01/08/2009  . Fatigue 12/27/2008  . Depression 08/22/2008  . Gout 12/12/2007  . OBESITY 09/24/2007  . ATHEROSCLEROSIS, CORONARY, NATIVE ARTERY 06/16/2007  . ERECTILE DYSFUNCTION 05/07/2007  . HYPERCHOLESTEROLEMIA, PURE 05/05/2007  . OSA on CPAP 05/05/2007  . Essential hypertension 05/05/2007  . Coronary atherosclerosis 05/05/2007  . BPH (benign prostatic hyperplasia) 05/05/2007  . SLEEP APNEA 05/05/2007  . Hyperglycemia 04/05/2007    Orientation RESPIRATION BLADDER Height & Weight     Self, Time, Situation, Place  Normal Incontinent Weight: 200 lb (90.7 kg) Height:  5\' 9"  (175.3 cm)  BEHAVIORAL SYMPTOMS/MOOD NEUROLOGICAL BOWEL NUTRITION STATUS      Continent Diet(Diet: Regular )  AMBULATORY STATUS COMMUNICATION OF NEEDS Skin   Extensive Assist Verbally Surgical wounds(Incision: Right Hip. )                       Personal Care Assistance Level of Assistance  Bathing, Feeding, Dressing Bathing Assistance: Limited assistance Feeding assistance: Independent Dressing Assistance: Limited assistance     Functional Limitations Info  Sight, Hearing, Speech Sight Info: Adequate Hearing Info: Impaired Speech Info: Adequate    SPECIAL CARE FACTORS FREQUENCY  PT (By licensed PT), OT (By licensed OT)     PT Frequency: (5) OT Frequency: (5)            Contractures      Additional Factors Info  Code Status, Allergies Code Status Info: (Full Code. ) Allergies Info: (Tessalon Benzonatate)  Current Medications (04/11/2018):  This is the current hospital active medication list Current Facility-Administered Medications  Medication Dose Route Frequency Provider Last Rate Last Dose  . [START ON 04/12/2018] acetaminophen (TYLENOL) tablet 325-650 mg  325-650 mg Oral Q6H PRN Lovell Sheehan, MD      .  acetaminophen (TYLENOL) tablet 500 mg  500 mg Oral Q6H Lovell Sheehan, MD   500 mg at 04/11/18 1216  . albuterol (PROVENTIL) (2.5 MG/3ML) 0.083% nebulizer solution 2.5 mg  2.5 mg Nebulization Q6H PRN Lovell Sheehan, MD      . allopurinol (ZYLOPRIM) tablet 100 mg  100 mg Oral Daily Lovell Sheehan, MD   100 mg at 04/11/18 1216  . [START ON 04/12/2018] aspirin EC tablet 325 mg  325 mg Oral Q breakfast Lovell Sheehan, MD      . bisacodyl (DULCOLAX) suppository 10 mg  10 mg Rectal Daily PRN Lovell Sheehan, MD      . ceFAZolin (ANCEF) IVPB 2g/100 mL premix  2 g Intravenous Q6H Lovell Sheehan, MD   Stopped at 04/11/18 1445  . cholecalciferol (VITAMIN D) tablet 2,000 Units  2,000 Units Oral Daily Lovell Sheehan, MD      . citalopram (CELEXA) tablet 60 mg  60 mg Oral Daily Lovell Sheehan, MD   60 mg at 04/11/18 1218  . docusate sodium (COLACE) capsule 100 mg  100 mg Oral BID Lovell Sheehan, MD   100 mg at 04/11/18 1218  . gabapentin (NEURONTIN) capsule 300 mg  300 mg Oral TID Lovell Sheehan, MD      . HYDROcodone-acetaminophen [facility]) 7.5-325 MG per tablet 1-2 tablet  1-2 tablet Oral Q4H PRN Lovell Sheehan, MD      . HYDROcodone-acetaminophen (NORCO/VICODIN) 5-325 MG per tablet 1-2 tablet  1-2 tablet Oral Q4H PRN Lovell Sheehan, MD   2 tablet at 04/11/18 1416  . ketorolac (TORADOL) 15 MG/ML injection 7.5 mg  7.5 mg Intravenous Q6H Lovell Sheehan, MD   7.5 mg at 04/11/18 1218  . lactated ringers infusion   Intravenous Continuous Lovell Sheehan, MD 75 mL/hr at 04/11/18 1130    . magnesium citrate solution 1 Bottle  1 Bottle Oral Once PRN Lovell Sheehan, MD      . menthol-cetylpyridinium (CEPACOL) lozenge 3 mg  1 lozenge Oral PRN Lovell Sheehan, MD       Or  . phenol (CHLORASEPTIC) mouth spray 1 spray  1 spray Mouth/Throat PRN Lovell Sheehan, MD      . metoCLOPramide (REGLAN) tablet 5-10 mg  5-10 mg Oral Q8H PRN Lovell Sheehan, MD       Or  . metoCLOPramide (REGLAN) injection 5-10 mg  5-10  mg Intravenous Q8H PRN Lovell Sheehan, MD      . metoprolol tartrate (LOPRESSOR) 25 MG tablet           . metoprolol tartrate (LOPRESSOR) tablet 25 mg  25 mg Oral BID Lovell Sheehan, MD      . morphine 2 MG/ML injection 0.5-1 mg  0.5-1 mg Intravenous Q2H PRN Lovell Sheehan, MD      . ondansetron [facility]) tablet 4 mg  4 mg Oral Q6H PRN Lovell Sheehan, MD       Or  . ondansetron [facility]) injection 4 mg  4 mg Intravenous Q6H PRN Lovell Sheehan, MD      . simvastatin (ZOCOR) tablet 20 mg  20 mg Oral  Lawrence Santiago, MD      . tamsulosin [facility]) capsule 0.4 mg  0.4 mg Oral Daily Lovell Sheehan, MD   0.4 mg at 04/11/18 1217  . traMADol (ULTRAM) tablet 50 mg  50 mg Oral Q6H Lovell Sheehan, MD   50 mg at 04/11/18 1217     Discharge Medications: Please see discharge summary for a list of discharge medications.  Relevant Imaging Results:  Relevant Lab Results:   Additional Information (SSN: [id number])  Tasean Mancha, Veronia Beets, LCSW

## 2018-04-11 NOTE — Transfer of Care (Signed)
Immediate Anesthesia Transfer of Care Note  Patient: Christopher Santiago  Procedure(s) Performed: TOTAL HIP ARTHROPLASTY ANTERIOR APPROACH (Right Hip)  Patient Location: PACU  Anesthesia Type:Spinal  Level of Consciousness: awake, alert , oriented and patient cooperative  Airway & Oxygen Therapy: Patient Spontanous Breathing and Patient connected to face mask oxygen  Post-op Assessment: Report given to RN and Post -op Vital signs reviewed and stable  Post vital signs: Reviewed and stable  Last Vitals:  Vitals Value Taken Time  BP 124/63 04/11/2018  9:54 AM  Temp    Pulse 63 04/11/2018  9:55 AM  Resp 15 04/11/2018  9:55 AM  SpO2 100 % 04/11/2018  9:55 AM  Vitals shown include unvalidated device data.  Last Pain:  Vitals:   04/11/18 0614  TempSrc: Tympanic  PainSc: 3          Complications: No apparent anesthesia complications

## 2018-04-11 NOTE — Anesthesia Procedure Notes (Signed)
Performed by: Bernardo Heater, CRNA

## 2018-04-11 NOTE — Op Note (Signed)
04/11/2018  9:58 AM  PATIENT:  BRAYDYN SCHULTES   MRN: [id number]  PRE-OPERATIVE DIAGNOSIS:  Osteoarthritis right hip   POST-OPERATIVE DIAGNOSIS: Same  Procedure: Right Total Hip Replacement  Surgeon: Elyn Aquas. Harlow Mares, MD   Assist: Carlynn Spry, PA-C  Anesthesia: Spinal   EBL: 300 mL   Specimens: None   Drains: None   Components used: A size 3 lateral Polarstem [employer], R3 size 56 mm shell, and a 36 mm +0 mm head    Description of the procedure in detail: After informed consent was obtained and the appropriate extremity marked in the pre-operative holding area, the patient was taken to the operating room and placed in the supine position on the fracture table. All pressure points were well padded and bilateral lower extremities were place in traction spars. The hip was prepped and draped in standard sterile fashion. A spinal anesthetic had been delivered by the anesthesia team. The skin and subcutaneous tissues were injected with a mixture of Marcaine with epinephrine for post-operative pain. A longitudinal incision approximately 10 cm in length was carried out from the anterior superior iliac spine to the greater trochanter. The tensor fascia was divided and blunt dissection was taken down to the level of the joint capsule. The lateral circumflex vessels were cauterized. Deep retractors were placed and a portion of the anterior capsule was excised. Using fluoroscopy the neck cut was planned and carried out with a sagittal saw. The head was passed from the field with use of a corkscrew and hip skid. Deep retractors were placed along the acetabulum and the degenerative labrum and large osteophytes were removed with a Rongeur. The cup was sequentially reamed to a size 56 mm. The wound was irrigated and using fluoroscopy the size 56 mm cup was impacted in to anatomic position. A single screw was placed followed by a threaded hole cover. The final liner was impacted in to position.  Attention was then turned to the proximal femur. The leg was placed in extension and external rotation. The canal was opened and sequentially broached to a size 3 with a lateral neck trial. The trial components were placed and the hip relocated. The components were found to be in good position using fluoroscopy. The hip was dislocated and the trial components removed. The final components were impacted in to position and the hip relocated. The final components were again check with fluoroscopy and found to be in good position. Hemostasis was achieved with electrocautery. The deep capsule was injected with Marcaine and epinephrine. The wound was irrigated with bacitracin laced normal saline and the tensor fascia closed with #2 Quill suture. The subcutaneous tissues were closed with 2-0 vicryl and staples for the skin. A sterile dressing was applied and an abduction pillow. Patient tolerated the procedure well and there were no apparent complication. Patient was taken to the recovery room in good condition.   Kurtis Bushman, MD

## 2018-04-12 LAB — BASIC METABOLIC PANEL
ANION GAP: 4 — AB (ref 5–15)
BUN: 24 mg/dL — ABNORMAL HIGH (ref 6–20)
CALCIUM: 8.1 mg/dL — AB (ref 8.9–10.3)
CHLORIDE: 106 mmol/L (ref 101–111)
CO2: 26 mmol/L (ref 22–32)
Creatinine, Ser: 0.93 mg/dL (ref 0.61–1.24)
GFR calc Af Amer: >60 mL/min (ref >60)
GFR calc non Af Amer: >60 mL/min (ref >60)
Glucose, Bld: 99 mg/dL (ref 65–99)
POTASSIUM: 4.4 mmol/L (ref 3.5–5.1)
Sodium: 136 mmol/L (ref 135–145)

## 2018-04-12 LAB — CBC
HEMATOCRIT: 29.4 % — AB (ref 40.0–52.0)
Hemoglobin: 9.8 g/dL — ABNORMAL LOW (ref 13.0–18.0)
MCH: 31.2 pg (ref 26.0–34.0)
MCHC: 33.2 g/dL (ref 32.0–36.0)
MCV: 94 fL (ref 80.0–100.0)
Platelets: 102 10*3/uL — ABNORMAL LOW (ref 150–440)
RBC: 3.12 MIL/uL — AB (ref 4.40–5.90)
RDW: 13.9 % (ref 11.5–14.5)
WBC: 7.5 10*3/uL (ref 3.8–10.6)

## 2018-04-12 MED ORDER — TRAMADOL HCL 50 MG PO TABS: 50.0000 mg | ORAL_TABLET | Freq: Four times a day (QID) | ORAL | 0 refills | Status: DC | PRN

## 2018-04-12 MED ORDER — DOCUSATE SODIUM 100 MG PO CAPS: 100.0000 mg | ORAL_CAPSULE | Freq: Two times a day (BID) | ORAL | 0 refills | Status: DC
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
## 2018-04-12 NOTE — Anesthesia Postprocedure Evaluation (Signed)
Anesthesia Post Note  Patient: Christopher Santiago  Procedure(s) Performed: TOTAL HIP ARTHROPLASTY ANTERIOR APPROACH (Right Hip)  Patient location during evaluation: Other Anesthesia Type: Spinal Level of consciousness: oriented and awake and alert Pain management: pain level controlled Vital Signs Assessment: post-procedure vital signs reviewed and stable Respiratory status: spontaneous breathing, respiratory function stable and patient connected to nasal cannula oxygen Cardiovascular status: blood pressure returned to baseline and stable Postop Assessment: no headache, no backache and no apparent nausea or vomiting Anesthetic complications: no     Last Vitals:  Vitals:   04/12/18 0533 04/12/18 0733  BP: (!) 154/61 (!) 142/64  Pulse: (!) 57 (!) 59  Resp:    Temp:  (!) 36.3 C  SpO2:  95%    Last Pain:  Vitals:   04/12/18 0733  TempSrc: Axillary  PainSc:                  Alison Stalling

## 2018-04-12 NOTE — Care Management (Addendum)
Patient discharging to home today. Rolling walker has been delivered by [facility]. I have updated [facility] with [facility]. No other RNCM needs. UPdate: discharge order placed however date is for tomorrow's date. Jonette Eva has been updated again- transportation has been arranged.

## 2018-04-12 NOTE — Progress Notes (Signed)
Subjective:  Patient reports pain as mild.  No other complaints  Objective:   VITALS:   Vitals:   04/12/18 0506 04/12/18 0533 04/12/18 0733 04/12/18 1152  BP: (!) 174/64 (!) 154/61 (!) 142/64 (!) 162/65  Pulse: 65 (!) 57 (!) 59 66  Resp:      Temp:   (!) 97.4 F (36.3 C) 98.7 F (37.1 C)  TempSrc:   Axillary Oral  SpO2:   95% 92%  Weight:      Height:        PHYSICAL EXAM:  Sensation intact distally Dorsiflexion/Plantar flexion intact Incision: dressing C/D/I Compartment soft  LABS  Results for orders placed or performed during the hospital encounter of 04/11/18 (from the past 24 hour(s))  CBC     Status: Abnormal   Collection Time: 04/12/18  4:04 AM  Result Value Ref Range   WBC 7.5 3.8 - 10.6 K/uL   RBC 3.12 (L) 4.40 - 5.90 MIL/uL   Hemoglobin 9.8 (L) 13.0 - 18.0 g/dL   HCT 29.4 (L) 40.0 - 52.0 %   MCV 94.0 80.0 - 100.0 fL   MCH 31.2 26.0 - 34.0 pg   MCHC 33.2 32.0 - 36.0 g/dL   RDW 13.9 11.5 - 14.5 %   Platelets 102 (L) 150 - 440 K/uL  Basic metabolic panel     Status: Abnormal   Collection Time: 04/12/18  4:04 AM  Result Value Ref Range   Sodium 136 135 - 145 mmol/L   Potassium 4.4 3.5 - 5.1 mmol/L   Chloride 106 101 - 111 mmol/L   CO2 26 22 - 32 mmol/L   Glucose, Bld 99 65 - 99 mg/dL   BUN 24 (H) 6 - 20 mg/dL   Creatinine, Ser 0.93 0.61 - 1.24 mg/dL   Calcium 8.1 (L) 8.9 - 10.3 mg/dL   GFR calc non Af Amer >60 >60 mL/min   GFR calc Af Amer >60 >60 mL/min   Anion gap 4 (L) 5 - 15    Dg Pelvis Portable  Result Date: 04/11/2018 CLINICAL DATA:  Postop right hip arthroplasty. EXAM: PORTABLE PELVIS 1-2 VIEWS COMPARISON:  02/10/2016. FINDINGS: Interval right total hip arthroplasty. Subcutaneous/joint air and overlying surgical skin staples are noted. Femoral stem appears well seated. Mild subchondral sclerosis in the left hip. Hip joint space appears maintained. Mild degenerative changes in the left sacroiliac joint. Vascular calcifications. IMPRESSION: 1.  Right total hip arthroplasty without complicating feature. 2. Mild left hip osteoarthritis. Electronically Signed   By: Lorin Picket M.D.   On: 04/11/2018 10:32   Dg Hip Operative Unilat W Or W/o Pelvis Right  Result Date: 04/11/2018 CLINICAL DATA:  Intraoperative image right hip replacement. EXAM: OPERATIVE RIGHT HIP (WITH PELVIS IF PERFORMED) 1 VIEW TECHNIQUE: Fluoroscopic spot image(s) were submitted for interpretation post-operatively. COMPARISON:  None. FINDINGS: Single fluoroscopic intraoperative spot view of the right hip demonstrates a total hip arthroplasty in place. The device is located. No fracture is identified. Distal into the femoral stem is not included on the image. IMPRESSION: Intraoperative image showing a right hip replacement. Electronically Signed   By: Inge Rise M.D.   On: 04/11/2018 09:43    Assessment/Plan: 1 Day Post-Op   Active Problems:   Osteoarthritis of right hip   Advance diet Up with therapy D/C IV fluids Plan for discharge tomorrow with outpatient PT, Tramadol for pain and ECASA for DVT prophylaxis   Lovell Sheehan , MD 04/12/2018, 12:34 PM

## 2018-04-12 NOTE — Care Management Note (Addendum)
Case Management Note  Patient Details  Name: Christopher Santiago MRN: [id number] Date of Birth: 1931/06/05  Subjective/Objective:                  RNCM spoke with patient regarding transition of care under [facility]. Patient is not aware that he is to follow up as outpatient for PT and not aware of appointment time.  He asked for rollator walker however front wheeled walker has been suggested for safety by PT eval.  He lives with his wife that he states depends on a walker for mobility.  He also states that she can drive his home but then states that he will need transportation assistance to [place] appointments. Update from patient's wife- she has talked to [facility] and outpatient PT has been arranged (04/15/18 at 2:30P) and they will provide transportation assistance.  Wife states that patient does not do well with pain medication and feels that he is confused at this time. Wife has arranged caretaker to transport patient to home at discharge.   Action/Plan: I have left message per patient's request for wife to call RNCM.  I have reached out to [employer] with [facility] group for assistance.  Rolling walker requested from [place] with [facility].     Expected Discharge Date:                  Expected Discharge Plan:     In-House Referral:     Discharge planning Services  CM Consult  Post Acute Care Choice:  Durable Medical Equipment Choice offered to:  Patient, Spouse  DME Arranged:  Walker rolling DME Agency:  [place]:    [place] Agency:     Status of Service:  In process, will continue to follow  If discussed at Long Length of Stay Meetings, dates discussed:    Additional Comments:  Marshell Garfinkel, RN 04/12/2018, 10:12 AM

## 2018-04-12 NOTE — Progress Notes (Signed)
Pts bladder scan 750 ml/ MD Harlow Mares notified. Orders received for in and out X1. Order placed.

## 2018-04-12 NOTE — Progress Notes (Signed)
800 ml output from in and out

## 2018-04-12 NOTE — Discharge Instructions (Signed)

## 2018-04-12 NOTE — Progress Notes (Signed)
Physical Therapy Treatment Patient Details Name: Christopher Santiago MRN: [id number] DOB: 03-08-1931 Today's Date: 04/12/2018    History of Present Illness Patient is an 84 year old male admitted s/p R anterior THA. PMH includes COPD, trimalleolar fracture, PE, Htn, CAD and AAA.    PT Comments    Participated in exercises as described below.  Pt voiced being slightly confused this am.  "I know they are dreams but they seem so real", thought it was Wednesday and also thought sunrise this am was sunset.  Discussed with primary nurse.  To edge of bed with min a for LE management.  Some dizziness noted upon sitting and standing that resolved with time.  He was able to walk around unit with walker and min guard/assist with step though gait pattern.  Unsteady at times but able to recover without assist.  Pt pushed himself during session and was limited by writer to 1 lap to prevent soreness and increased pain.  Will continue this pm with further gait and exercise.   Follow Up Recommendations  Home health PT     Equipment Recommendations  Rolling walker with 5" wheels    Recommendations for Other Services       Precautions / Restrictions Precautions Precautions: Anterior Hip Restrictions Weight Bearing Restrictions: Yes RLE Weight Bearing: Weight bearing as tolerated    Mobility  Bed Mobility Overal bed mobility: Needs Assistance Bed Mobility: Supine to Sit     Supine to sit: Min assist     General bed mobility comments: for LE management  Transfers Overall transfer level: Needs assistance Equipment used: Rolling walker (2 wheeled) Transfers: Sit to/from Stand Sit to Stand: Min guard         General transfer comment: dizziness noted but relieved wth time  Ambulation/Gait       Gait Pattern/deviations: Step-through pattern   Gait velocity interpretation: <1.8 ft/sec, indicate of risk for recurrent falls     Stairs             Wheelchair Mobility    Modified  [place] (Stroke Patients Only)       Balance Overall balance assessment: Needs assistance Sitting-balance support: Feet supported Sitting balance-Leahy Scale: Good     Standing balance support: Bilateral upper extremity supported Standing balance-Leahy Scale: Fair                              Cognition Arousal/Alertness: Awake/alert Behavior During Therapy: WFL for tasks assessed/performed Overall Cognitive Status: Within Functional Limits for tasks assessed                                        Exercises Other Exercises Other Exercises: supine quad sets, glut sets and anke pumps BLE x 10, heel slides and ab/add x 10 AAROM RLE    General Comments        Pertinent Vitals/Pain Pain Assessment: No/denies pain Pain Location: no pain at rest,  Pain Intervention(s): Monitored during session    Home Living                      Prior Function            PT Goals (current goals can now be found in the care plan section) Progress towards PT goals: Progressing toward goals    Frequency  BID      PT Plan Current plan remains appropriate    Co-evaluation              AM-PAC PT "6 Clicks" Daily Activity  Outcome Measure  Difficulty turning over in bed (including adjusting bedclothes, sheets and blankets)?: A Little Difficulty moving from lying on back to sitting on the side of the bed? : A Little Difficulty sitting down on and standing up from a chair with arms (e.g., wheelchair, bedside commode, etc,.)?: A Little Help needed moving to and from a bed to chair (including a wheelchair)?: A Little Help needed walking in hospital room?: A Little Help needed climbing 3-5 steps with a railing? : A Little 6 Click Score: 18    End of Session Equipment Utilized During Treatment: Gait belt Activity Tolerance: Patient tolerated treatment well Patient left: in chair;with call bell/phone within reach;with chair alarm set Nurse  Communication: Other (comment)       Time: 4193-7902 PT Time Calculation (min) (ACUTE ONLY): 28 min  Charges:  $Gait Training: 8-22 mins $Therapeutic Exercise: 8-22 mins                    G Codes:       Chesley Noon, PTA 04/12/18, 9:06 AM

## 2018-04-12 NOTE — Progress Notes (Signed)
Clinical Social Worker (CSW) received SNF consult. PT is recommending home health. RN case manager aware of above. Please reconsult if future social work needs arise. CSW signing off.   Delina Kruczek, LCSW [phone] 

## 2018-04-12 NOTE — Progress Notes (Signed)
Nutrition Brief Note  Patient identified on the Malnutrition Screening Tool (MST) Report  84 year old male admitted s/p R anterior THA. PMH includes COPD, trimalleolar fracture, PE, Htn, CAD and AAA.  Pt eating 100% of meals. Per chart, pt is weight stable. Pt to discharge home today.   Wt Readings from Last 15 Encounters:  04/11/18 200 lb (90.7 kg)  04/06/18 204 lb (92.5 kg)  03/09/18 204 lb (92.5 kg)  01/25/18 208 lb (94.3 kg)  12/20/17 207 lb (93.9 kg)  11/30/17 207 lb 8 oz (94.1 kg)  11/12/17 209 lb (94.8 kg)  08/23/17 210 lb (95.3 kg)  08/10/17 208 lb 4 oz (94.5 kg)  05/12/17 206 lb (93.4 kg)  02/08/17 205 lb 8 oz (93.2 kg)  11/10/16 217 lb 9.6 oz (98.7 kg)  09/22/16 214 lb (97.1 kg)  08/07/16 219 lb 8 oz (99.6 kg)  07/20/16 217 lb (98.4 kg)    Body mass index is 29.53 kg/m. Patient meets criteria for overweight based on current BMI.   Current diet order is regular, patient is consuming approximately 100% of meals at this time. Labs and medications reviewed.   No nutrition interventions warranted at this time. If nutrition issues arise, please consult RD.   Koleen Distance MS, RD, LDN Pager #- [phone] Office#- [phone] After Hours Pager: [phone]

## 2018-04-12 NOTE — Progress Notes (Signed)
Physical Therapy Treatment Patient Details Name: Christopher Santiago MRN: [id number] DOB: 1930-12-15 Today's Date: 04/12/2018    History of Present Illness Patient is an 84 year old male admitted s/p R anterior THA. PMH includes COPD, trimalleolar fracture, PE, Htn, CAD and AAA.    PT Comments    Initial attempt, pt trying to use urinal and requested return in approximately 10 min. Second attempt, pt agreeable to PT; of note pt did not have success with voiding. Pt requires Min A for bed mobility and Min guard for STS with subjective initial temporary unsteadiness without overt LOB. Pt requires instruction for proper sequence with gait pattern and appropriate distance of rolling walker (tends to push out too far); inconsistent follow through with instruction. Pt also requires correction on turning to avoid large twisting motion for overall safety/balance. Pt felt need to void and wished to attempt in standing. Min guard during activity; again unable to void a measurable amount. Pt quiet fatigued and moaning in pain with weight bearing. Pt unable to attempt steps at this time; pt/spouse verbally instructed briefly on side stepping, as pt has 2 rails unreachable at the same time; advocated side stepping as safer method at this time for both pt and assistant. Pt has received new rolling walker; adjusted to appropriate height. Pt returned to bed per request. Educated pt as well on conservative approach to activity level versus over doing activity for continual progression/safety and to avoid set backs; pt understands. Continue PT to progress endurance/strength, quality of ambulation, overall safety and balance to improve all functional mobility and allow for a safe return home. Pt will need to perform steps; has 4 to enter home.   Follow Up Recommendations  Home health PT     Equipment Recommendations  Rolling walker with 5" wheels    Recommendations for Other Services       Precautions / Restrictions  Precautions Precautions: Anterior Hip Restrictions Weight Bearing Restrictions: Yes RLE Weight Bearing: Weight bearing as tolerated    Mobility  Bed Mobility Overal bed mobility: Needs Assistance Bed Mobility: Supine to Sit;Sit to Supine     Supine to sit: Min assist Sit to supine: Min assist   General bed mobility comments: For RLE  Transfers Overall transfer level: Needs assistance Equipment used: Rolling walker (2 wheeled) Transfers: Sit to/from Stand Sit to Stand: Min guard         General transfer comment: Mild increased time to feel steady; no overt LOB  Ambulation/Gait Ambulation/Gait assistance: Min guard Ambulation Distance (Feet): 50 Feet Assistive device: Rolling walker (2 wheeled) Gait Pattern/deviations: Step-to pattern;Step-through pattern(inconsistent)   Gait velocity interpretation: <1.8 ft/sec, indicate of risk for recurrent falls General Gait Details: Inconsistent gait pattern with instruction required for 3 point sequence and proper rw distance for safety and ability to de weight RLE; poor follow through consistently with instruction. Pt also demonstrates large twisting motion of trunk over feet for turning; educated on proper/safe turning technique.    Stairs Stairs: (uanble to attempt at this time due to pain/fatigue)           Wheelchair Mobility    Modified Rankin (Stroke Patients Only)       Balance Overall balance assessment: Needs assistance Sitting-balance support: Feet supported;Bilateral upper extremity supported Sitting balance-Leahy Scale: Good     Standing balance support: Bilateral upper extremity supported Standing balance-Leahy Scale: Fair  Cognition Arousal/Alertness: Awake/alert Behavior During Therapy: WFL for tasks assessed/performed Overall Cognitive Status: Within Functional Limits for tasks assessed                                        Exercises  Other Exercises Other Exercises: Min guard assist to attempt voiding in stand Other Exercises: Educated pt/spouse verbally on stair management sideways    General Comments        Pertinent Vitals/Pain Pain Assessment: 0-10 Pain Score: 4  Pain Location: R hip Pain Descriptors / Indicators: Moaning(with weightbearing) Pain Intervention(s): Limited activity within patient's tolerance;Monitored during session    Home Living                      Prior Function            PT Goals (current goals can now be found in the care plan section) Progress towards PT goals: Progressing toward goals    Frequency    BID      PT Plan Current plan remains appropriate    Co-evaluation              AM-PAC PT "6 Clicks" Daily Activity  Outcome Measure  Difficulty turning over in bed (including adjusting bedclothes, sheets and blankets)?: Unable Difficulty moving from lying on back to sitting on the side of the bed? : Unable Difficulty sitting down on and standing up from a chair with arms (e.g., wheelchair, bedside commode, etc,.)?: Unable Help needed moving to and from a bed to chair (including a wheelchair)?: A Little Help needed walking in hospital room?: A Little Help needed climbing 3-5 steps with a railing? : A Lot 6 Click Score: 11    End of Session Equipment Utilized During Treatment: Gait belt Activity Tolerance: Patient tolerated treatment well Patient left: in bed;with call bell/phone within reach;with bed alarm set;with nursing/sitter in room Nurse Communication: Mobility status;Other (comment)(update on inability to void) PT Visit Diagnosis: Unsteadiness on feet (R26.81);Pain Pain - Right/Left: Right Pain - part of body: Hip     Time: 8099-8338 PT Time Calculation (min) (ACUTE ONLY): 25 min  Charges:  $Gait Training: 8-22 mins $Therapeutic Activity: 8-22 mins                    G CodesLarae Grooms, PTA 04/12/2018, 5:29 PM

## 2018-04-13 DIAGNOSIS — R339 Retention of urine, unspecified: Secondary | ICD-10-CM

## 2018-04-13 LAB — CBC
HCT: 31.9 % — ABNORMAL LOW (ref 40.0–52.0)
Hemoglobin: 10.8 g/dL — ABNORMAL LOW (ref 13.0–18.0)
MCH: 32.3 pg (ref 26.0–34.0)
MCHC: 34 g/dL (ref 32.0–36.0)
MCV: 94.9 fL (ref 80.0–100.0)
PLATELETS: 109 10*3/uL — AB (ref 150–440)
RBC: 3.36 MIL/uL — AB (ref 4.40–5.90)
RDW: 14.4 % (ref 11.5–14.5)
WBC: 10.4 10*3/uL (ref 3.8–10.6)

## 2018-04-13 LAB — SURGICAL PATHOLOGY

## 2018-04-13 NOTE — Progress Notes (Signed)
Discharge instructions and med details reviewed with patient and wife. All questions answered. Patient verbalized understanding. Patient educated about catheter. IV removed. All patient belongings packed including CPAP and walker. AVS givent to patient. Patient escorted out via wheelchair.

## 2018-04-13 NOTE — Progress Notes (Signed)
Physical Therapy Treatment Patient Details Name: Christopher Santiago MRN: [id number] DOB: 1931/07/24 Today's Date: 04/13/2018    History of Present Illness Patient is an 84 year old male admitted s/p R anterior THA. PMH includes COPD, trimalleolar fracture, PE, Htn, CAD and AAA.    PT Comments    Pt agreeable to PT; reports 6/10 pain R hip with movement/weight bearing, but notes no pain at complete rest. Pt requires very Min A for mobilizing RLE in/out of bed. Min guard for STS with VCs for proper hand placement; improved steadiness this session with stand transfers. Min guard for ambulation for safety with re education on 3 point gait sequence as well as appropriate step lengths and QS with R stance for pain management/stability  with weight bearing on RLE and rolling walker management in reference to body position. Improved with education/cues. Min A for stair management ascending; Min guard for descending. Re enforced energy conservation/conservative approach to activity; pt states understanding. Pt plans to discharge home today with home health in place to continue therapy efforts.    Follow Up Recommendations  Home health PT     Equipment Recommendations  Rolling walker with 5" wheels(received)    Recommendations for Other Services       Precautions / Restrictions Precautions Precautions: Anterior Hip Restrictions Weight Bearing Restrictions: Yes RLE Weight Bearing: Weight bearing as tolerated    Mobility  Bed Mobility Overal bed mobility: Needs Assistance Bed Mobility: Supine to Sit;Sit to Supine     Supine to sit: Min assist Sit to supine: Min assist   General bed mobility comments: For RLE  Transfers Overall transfer level: Needs assistance Equipment used: Rolling walker (2 wheeled) Transfers: Sit to/from Stand Sit to Stand: Min guard         General transfer comment: VC's for safe hand placement. Improved steadiness, no voiced complaints of dizziness. Good  eccentric control with sit.   Ambulation/Gait Ambulation/Gait assistance: Min guard Ambulation Distance (Feet): 150 Feet Assistive device: Rolling walker (2 wheeled) Gait Pattern/deviations: Step-through pattern;Antalgic   Gait velocity interpretation: <1.8 ft/sec, indicate of risk for recurrent falls General Gait Details: Education for improved RLE stability with weight bearing with QS and mildly decreased step lengths for less stress through RLE and improved body position in refrence to rw; with correction improved tolerance and mild decreased RLE pain with weight bearing.    Stairs Stairs: Yes Stairs assistance: Min assist Stair Management: One rail Right;Step to pattern;Sideways Number of Stairs: 3 General stair comments: Brief seated rest during walk before steps. Min A ascending for stability with L step; good control descending. Pt had been negotiating steps in this faschion prior to surgery.    Wheelchair Mobility    Modified Rankin (Stroke Patients Only)       Balance Overall balance assessment: Needs assistance Sitting-balance support: Feet supported;Bilateral upper extremity supported Sitting balance-Leahy Scale: Good     Standing balance support: Bilateral upper extremity supported Standing balance-Leahy Scale: Fair Standing balance comment: No LOB; improved steadiness/confidence with re education on 3 point sequence and QS with R stance phase                            Cognition Arousal/Alertness: Awake/alert Behavior During Therapy: WFL for tasks assessed/performed Overall Cognitive Status: Within Functional Limits for tasks assessed  Exercises      General Comments        Pertinent Vitals/Pain Pain Assessment: 0-10 Pain Score: 6 (with movement/weight bearing; 0 at rest) Pain Location: R hip Pain Descriptors / Indicators: Aching;Sore;Operative site guarding Pain Intervention(s):  Monitored during session;Premedicated before session;Other (comment)(encouraged smaller steps with gait to decrease pain)    Home Living                      Prior Function            PT Goals (current goals can now be found in the care plan section) Progress towards PT goals: Progressing toward goals    Frequency    BID      PT Plan Current plan remains appropriate    Co-evaluation              AM-PAC PT "6 Clicks" Daily Activity  Outcome Measure  Difficulty turning over in bed (including adjusting bedclothes, sheets and blankets)?: A Little Difficulty moving from lying on back to sitting on the side of the bed? : Unable Difficulty sitting down on and standing up from a chair with arms (e.g., wheelchair, bedside commode, etc,.)?: Unable Help needed moving to and from a bed to chair (including a wheelchair)?: A Little Help needed walking in hospital room?: A Little Help needed climbing 3-5 steps with a railing? : A Little 6 Click Score: 14    End of Session Equipment Utilized During Treatment: Gait belt Activity Tolerance: Patient tolerated treatment well Patient left: in bed;with call bell/phone within reach;with bed alarm set;with nursing/sitter in room   PT Visit Diagnosis: Unsteadiness on feet (R26.81);Pain Pain - Right/Left: Right Pain - part of body: Hip     Time: 7588-3254 PT Time Calculation (min) (ACUTE ONLY): 38 min  Charges:  $Gait Training: 23-37 mins $Therapeutic Activity: 8-22 mins                    G Codes:        Larae Grooms, PTA 04/13/2018, 9:36 AM

## 2018-04-13 NOTE — Progress Notes (Signed)
Unable to place Foley catheter. MD  Harlow Mares paged and received verbal orders for Coude catheter. Per MD, have patient call MD Harlow Mares office this following Tuesday.  Pt and wife updated.

## 2018-04-13 NOTE — Progress Notes (Signed)
Per Ortho PA. Place urinary catheter to DC patient. And patient will follow up with Urologist.

## 2018-04-14 ENCOUNTER — Emergency Department: Payer: Medicare Other

## 2018-04-14 ENCOUNTER — Other Ambulatory Visit: Payer: Self-pay

## 2018-04-14 ENCOUNTER — Encounter: Payer: Self-pay | Admitting: Emergency Medicine

## 2018-04-14 ENCOUNTER — Emergency Department
Admission: EM | Admit: 2018-04-14 | Discharge: 2018-04-14 | Disposition: A | Discharge status: Home / Self Care | Payer: Medicare Other | Attending: Emergency Medicine | Admitting: Emergency Medicine

## 2018-04-14 DIAGNOSIS — Z87891 Personal history of nicotine dependence: Secondary | ICD-10-CM | POA: Diagnosis not present

## 2018-04-14 DIAGNOSIS — E119 Type 2 diabetes mellitus without complications: Secondary | ICD-10-CM | POA: Insufficient documentation

## 2018-04-14 DIAGNOSIS — Z79899 Other long term (current) drug therapy: Secondary | ICD-10-CM | POA: Diagnosis not present

## 2018-04-14 DIAGNOSIS — R4182 Altered mental status, unspecified: Secondary | ICD-10-CM | POA: Diagnosis not present

## 2018-04-14 DIAGNOSIS — I110 Hypertensive heart disease with heart failure: Secondary | ICD-10-CM | POA: Insufficient documentation

## 2018-04-14 DIAGNOSIS — R410 Disorientation, unspecified: Secondary | ICD-10-CM | POA: Diagnosis not present

## 2018-04-14 DIAGNOSIS — Z96641 Presence of right artificial hip joint: Secondary | ICD-10-CM | POA: Diagnosis not present

## 2018-04-14 DIAGNOSIS — J439 Emphysema, unspecified: Secondary | ICD-10-CM | POA: Diagnosis not present

## 2018-04-14 DIAGNOSIS — R441 Visual hallucinations: Secondary | ICD-10-CM | POA: Insufficient documentation

## 2018-04-14 DIAGNOSIS — I5032 Chronic diastolic (congestive) heart failure: Secondary | ICD-10-CM | POA: Diagnosis not present

## 2018-04-14 DIAGNOSIS — R339 Retention of urine, unspecified: Secondary | ICD-10-CM | POA: Diagnosis not present

## 2018-04-14 DIAGNOSIS — Z8521 Personal history of malignant neoplasm of larynx: Secondary | ICD-10-CM | POA: Diagnosis not present

## 2018-04-14 DIAGNOSIS — R918 Other nonspecific abnormal finding of lung field: Secondary | ICD-10-CM | POA: Diagnosis not present

## 2018-04-14 DIAGNOSIS — R338 Other retention of urine: Secondary | ICD-10-CM | POA: Diagnosis not present

## 2018-04-14 DIAGNOSIS — N401 Enlarged prostate with lower urinary tract symptoms: Secondary | ICD-10-CM | POA: Diagnosis not present

## 2018-04-14 DIAGNOSIS — R278 Other lack of coordination: Secondary | ICD-10-CM | POA: Diagnosis not present

## 2018-04-14 DIAGNOSIS — Z7982 Long term (current) use of aspirin: Secondary | ICD-10-CM | POA: Diagnosis not present

## 2018-04-14 DIAGNOSIS — W19XXXA Unspecified fall, initial encounter: Secondary | ICD-10-CM | POA: Diagnosis not present

## 2018-04-14 DIAGNOSIS — Z86711 Personal history of pulmonary embolism: Secondary | ICD-10-CM | POA: Insufficient documentation

## 2018-04-14 DIAGNOSIS — J449 Chronic obstructive pulmonary disease, unspecified: Secondary | ICD-10-CM | POA: Insufficient documentation

## 2018-04-14 DIAGNOSIS — I4510 Unspecified right bundle-branch block: Secondary | ICD-10-CM | POA: Diagnosis not present

## 2018-04-14 DIAGNOSIS — I2510 Atherosclerotic heart disease of native coronary artery without angina pectoris: Secondary | ICD-10-CM | POA: Diagnosis not present

## 2018-04-14 DIAGNOSIS — R3915 Urgency of urination: Secondary | ICD-10-CM | POA: Diagnosis not present

## 2018-04-14 DIAGNOSIS — T83198A Other mechanical complication of other urinary devices and implants, initial encounter: Secondary | ICD-10-CM | POA: Diagnosis not present

## 2018-04-14 DIAGNOSIS — Z9181 History of falling: Secondary | ICD-10-CM | POA: Diagnosis not present

## 2018-04-14 DIAGNOSIS — Z4789 Encounter for other orthopedic aftercare: Secondary | ICD-10-CM | POA: Diagnosis not present

## 2018-04-14 DIAGNOSIS — Z7401 Bed confinement status: Secondary | ICD-10-CM | POA: Diagnosis not present

## 2018-04-14 DIAGNOSIS — M6281 Muscle weakness (generalized): Secondary | ICD-10-CM | POA: Diagnosis not present

## 2018-04-14 DIAGNOSIS — R4020 Unspecified coma: Secondary | ICD-10-CM | POA: Diagnosis not present

## 2018-04-14 DIAGNOSIS — R2681 Unsteadiness on feet: Secondary | ICD-10-CM | POA: Diagnosis not present

## 2018-04-14 LAB — BASIC METABOLIC PANEL
Anion gap: 6 (ref 5–15)
BUN: 25 mg/dL — ABNORMAL HIGH (ref 6–20)
CO2: 29 mmol/L (ref 22–32)
CREATININE: 0.88 mg/dL (ref 0.61–1.24)
Calcium: 9 mg/dL (ref 8.9–10.3)
Chloride: 101 mmol/L (ref 101–111)
GFR calc non Af Amer: >60 mL/min (ref >60)
Glucose, Bld: 113 mg/dL — ABNORMAL HIGH (ref 65–99)
Potassium: 4 mmol/L (ref 3.5–5.1)
SODIUM: 136 mmol/L (ref 135–145)

## 2018-04-14 LAB — URINALYSIS, COMPLETE (UACMP) WITH MICROSCOPIC
BILIRUBIN URINE: NEGATIVE
Bacteria, UA: NONE SEEN
GLUCOSE, UA: NEGATIVE mg/dL
KETONES UR: NEGATIVE mg/dL
NITRITE: NEGATIVE
Protein, ur: NEGATIVE mg/dL
Specific Gravity, Urine: 1.006 (ref 1.005–1.030)
Squamous Epithelial / LPF: NONE SEEN (ref 0–5)
pH: 5 (ref 5.0–8.0)

## 2018-04-14 LAB — CBC
HCT: 31.6 % — ABNORMAL LOW (ref 40.0–52.0)
Hemoglobin: 10.9 g/dL — ABNORMAL LOW (ref 13.0–18.0)
MCH: 32.2 pg (ref 26.0–34.0)
MCHC: 34.4 g/dL (ref 32.0–36.0)
MCV: 93.4 fL (ref 80.0–100.0)
PLATELETS: 149 10*3/uL — AB (ref 150–440)
RBC: 3.38 MIL/uL — AB (ref 4.40–5.90)
RDW: 14.6 % — AB (ref 11.5–14.5)
WBC: 10.9 10*3/uL — AB (ref 3.8–10.6)

## 2018-04-14 LAB — TROPONIN I: TROPONIN I: 0.04 ng/mL — AB (ref <0.03)

## 2018-04-14 LAB — AMMONIA: Ammonia: <9 umol/L — ABNORMAL LOW (ref 9–35)

## 2018-04-14 LAB — CK: CK TOTAL: 386 U/L (ref 49–397)

## 2018-04-14 MED ORDER — LIDOCAINE HCL URETHRAL/MUCOSAL 2 % EX GEL
1.0000 "application " | Freq: Once | CUTANEOUS | Status: AC
  Administered 2018-04-14: 1 via URETHRAL
  Filled 2018-04-14: qty 10

## 2018-04-14 NOTE — ED Notes (Signed)
Date and time results received: 04/14/18 1456  Test: Troponin Critical Value: 0.04  Name of Provider Notified: Dr. Jacqualine Code

## 2018-04-14 NOTE — ED Notes (Signed)
Pt given cup of water. Drinking it without problems.

## 2018-04-14 NOTE — Progress Notes (Addendum)
Clinical Education officer, museum (CSW) received SNF consult from MD. Per MD patient requires SNF. Patient is under the medicare bundle program and does not require a 3 night stay. Per Seth Bake at [facility] they can accept patient today from the ED to room 311. RN will call report at [phone] and arrange EMS for transport. RN will send tramadol prescription with patient. CSW sent D/C summary and FL2 to [facility] from last admission, which was yesterday. CSW contacted patient's wife Christopher Santiago and made her aware of above. Per Christopher Santiago she will have her caregiver bring patient's c-pap, hearing aid battery and clothes to [facility] today. Seth Bake at [facility] is aware of above.   CSW left voicemails for Dr. Harlow Mares and Jonette Eva Bundle Case Manager. Please reconsult if future social work needs arise. CSW signing off.   Dr. Harlow Mares called CSW back and is in agreement with plan.   [employer], LCSW [phone]

## 2018-04-14 NOTE — ED Notes (Signed)
Pt here with friend who has been staying with pt and wife d/t recent R hip surgery and wife being hadicapped. Friend states he has surgery on Monday on R hip and being DC a few days later. Denies fever at home. Friend states pt is confused, has been saying he is confused. Alert and oriented at this time. Answering all questions appropriately. Friend states he was found on floor this AM wrapped in his CPAP cords. Pt cut foley tubing. States little bit of drainage from it but didn't really drink much last night. Had foley placed while in hospital d/t urinary retention. Seeing urologist next week. Taking toradol for pain. Friend states that pt hasn't taken any narcotic pain medicine. Pt has clean patch over incision on R hip. No redness or drainage noted. Denies pain. Denies chills.

## 2018-04-14 NOTE — ED Notes (Signed)
No coude caths in ED. Called supply chain to bring a coude cath insertion kit to ED.

## 2018-04-14 NOTE — ED Triage Notes (Signed)
Brought in via ems from home  States he became slightly confused and cut the tubing on his cath  States the cath was placed yesterday  Denies any other complaints

## 2018-04-14 NOTE — ED Notes (Signed)
Re-drew light green and dark green and sent to lab.

## 2018-04-14 NOTE — Care Management (Signed)
Patient just discharged from hospital yesterday to home followed by Bundle plan under [facility]. I have notified Case manager with Bundle [employer].
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
## 2018-04-14 NOTE — ED Notes (Signed)
Admitting at bedisde.

## 2018-04-14 NOTE — ED Notes (Signed)
EMS at bedside to take pt to [facility] room 311.

## 2018-04-14 NOTE — ED Notes (Signed)
Pt c/o of pain at head of penis with bladder irrigation. 34ml irrigated, about 10-20 ml returned. Dr. Jacqualine Code informed.
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
## 2018-04-14 NOTE — ED Notes (Signed)
Mel Almond, social work, called this Therapist, sports to inform this RN that pt has a room at [facility], room 311. Medical necessity would need to be filled out and report called to [phone]. Mel Almond wanted to remind this RN that Dr. Mindi Slicker was writing tramadol prescription to be sent with pt to [facility]. Mel Almond informed this RN that pt's wife has been informed and that she is arranging for pt CPAP, hearing aid batteries, and clothes to be taken to [facility].

## 2018-04-16 LAB — URINE CULTURE
Culture: NO GROWTH
Special Requests: NORMAL

## 2018-04-19 DIAGNOSIS — R338 Other retention of urine: Secondary | ICD-10-CM | POA: Diagnosis not present

## 2018-04-19 DIAGNOSIS — Z96641 Presence of right artificial hip joint: Secondary | ICD-10-CM

## 2018-04-19 DIAGNOSIS — R3915 Urgency of urination: Secondary | ICD-10-CM | POA: Diagnosis not present

## 2018-04-19 DIAGNOSIS — J439 Emphysema, unspecified: Secondary | ICD-10-CM

## 2018-04-19 DIAGNOSIS — R339 Retention of urine, unspecified: Secondary | ICD-10-CM

## 2018-04-19 DIAGNOSIS — I2510 Atherosclerotic heart disease of native coronary artery without angina pectoris: Secondary | ICD-10-CM

## 2018-04-19 DIAGNOSIS — R410 Disorientation, unspecified: Secondary | ICD-10-CM

## 2018-04-25 DIAGNOSIS — M25551 Pain in right hip: Secondary | ICD-10-CM | POA: Diagnosis not present

## 2018-04-25 DIAGNOSIS — R2689 Other abnormalities of gait and mobility: Secondary | ICD-10-CM | POA: Diagnosis not present

## 2018-04-27 DIAGNOSIS — R338 Other retention of urine: Secondary | ICD-10-CM | POA: Diagnosis not present

## 2018-04-28 ENCOUNTER — Other Ambulatory Visit: Payer: Self-pay | Admitting: Family Medicine

## 2018-04-28 ENCOUNTER — Encounter: Payer: Self-pay | Admitting: Family Medicine

## 2018-04-28 ENCOUNTER — Ambulatory Visit (INDEPENDENT_AMBULATORY_CARE_PROVIDER_SITE_OTHER): Payer: Medicare Other | Admitting: Family Medicine

## 2018-04-28 VITALS — BP 136/60 | HR 60 | Temp 98.5°F | Ht 70.0 in | Wt 193.0 lb

## 2018-04-28 DIAGNOSIS — R531 Weakness: Secondary | ICD-10-CM

## 2018-04-28 DIAGNOSIS — I2510 Atherosclerotic heart disease of native coronary artery without angina pectoris: Secondary | ICD-10-CM | POA: Diagnosis not present

## 2018-04-28 LAB — COMPREHENSIVE METABOLIC PANEL
ALBUMIN: 3.2 g/dL — AB (ref 3.5–5.2)
ALK PHOS: 53 U/L (ref 39–117)
ALT: 14 U/L (ref 0–53)
AST: 16 U/L (ref 0–37)
BILIRUBIN TOTAL: 0.4 mg/dL (ref 0.2–1.2)
BUN: 22 mg/dL (ref 6–23)
CALCIUM: 9.3 mg/dL (ref 8.4–10.5)
CO2: 29 mEq/L (ref 19–32)
CREATININE: 1.07 mg/dL (ref 0.40–1.50)
Chloride: 102 mEq/L (ref 96–112)
GFR: 69.78 mL/min (ref >60.00)
Glucose, Bld: 171 mg/dL — ABNORMAL HIGH (ref 70–99)
Potassium: 4.5 mEq/L (ref 3.5–5.1)
Sodium: 138 mEq/L (ref 135–145)
Total Protein: 6.8 g/dL (ref 6.0–8.3)

## 2018-04-28 LAB — CBC WITH DIFFERENTIAL/PLATELET
BASOS ABS: 0 10*3/uL (ref 0.0–0.1)
Basophils Relative: 0.4 % (ref 0.0–3.0)
EOS ABS: 0.1 10*3/uL (ref 0.0–0.7)
Eosinophils Relative: 0.9 % (ref 0.0–5.0)
HEMATOCRIT: 29.7 % — AB (ref 39.0–52.0)
HEMOGLOBIN: 10 g/dL — AB (ref 13.0–17.0)
LYMPHS PCT: 8.1 % — AB (ref 12.0–46.0)
Lymphs Abs: 0.9 10*3/uL (ref 0.7–4.0)
MCHC: 33.8 g/dL (ref 30.0–36.0)
MCV: 92 fl (ref 78.0–100.0)
Monocytes Absolute: 1 10*3/uL (ref 0.1–1.0)
Monocytes Relative: 8.9 % (ref 3.0–12.0)
Neutro Abs: 8.8 10*3/uL — ABNORMAL HIGH (ref 1.4–7.7)
Neutrophils Relative %: 81.7 % — ABNORMAL HIGH (ref 43.0–77.0)
Platelets: 338 10*3/uL (ref 150.0–400.0)
RBC: 3.22 Mil/uL — AB (ref 4.22–5.81)
RDW: 14 % (ref 11.5–15.5)
WBC: 10.8 10*3/uL — AB (ref 4.0–10.5)

## 2018-04-28 MED ORDER — NYSTATIN 100000 UNIT/GM EX CREA: 1.0000 "application " | TOPICAL_CREAM | Freq: Two times a day (BID) | CUTANEOUS | 1 refills | Status: DC

## 2018-04-28 MED ORDER — INDOMETHACIN 50 MG PO CAPS: ORAL_CAPSULE | ORAL | Status: DC

## 2018-04-28 NOTE — Patient Instructions (Addendum)
If you have irritation, then use nystatin cream.  Go to the lab on the way out.  We'll contact you with your lab report. I'll update urology.  Take care.  Glad to see you.

## 2018-04-28 NOTE — Progress Notes (Signed)
He had follow up with [facility] yesterday, catheter removed. He had to get up a lot last night due to urinary frequency.  He is still on flomax.   He feels weak in general today- he didn't sleep well last night.  He felt better yesterday.  No burning with urination.  No vomiting.  No fevers.    He had rehab at [facility].  He went to PT Monday but didn't feel well enough to go today.  See above.  No pain, no hip pain.    He was asking about driving.  It appears that he is not yet independent enough and has too high of a fall risk to be able to drive on his own yet.  Advised.  He will need to follow-up with orthopedics and his other doctors before resuming driving.  PMH and SH reviewed  ROS: Per HPI unless specifically indicated in ROS section   Meds, vitals, and allergies reviewed.   nad ncat Mmm Neck supple, no LA rrr ctab abd soft, normal BS Ext w/o edema.  Senile purpura noted.  Skin well perfused otherwise. External genitalia with local irritation and slight meatal erosion noted with some mild discharge consistent with a fungal infection on the glans. Walking with walker No focal weakness in the extremities.  Able to bear weight.

## 2018-04-29 DIAGNOSIS — R300 Dysuria: Secondary | ICD-10-CM | POA: Diagnosis not present

## 2018-04-29 DIAGNOSIS — R2689 Other abnormalities of gait and mobility: Secondary | ICD-10-CM | POA: Diagnosis not present

## 2018-04-29 DIAGNOSIS — M25551 Pain in right hip: Secondary | ICD-10-CM | POA: Diagnosis not present

## 2018-04-29 DIAGNOSIS — R338 Other retention of urine: Secondary | ICD-10-CM | POA: Diagnosis not present

## 2018-05-01 ENCOUNTER — Telehealth: Payer: Self-pay | Admitting: Family Medicine

## 2018-05-01 DIAGNOSIS — R32 Unspecified urinary incontinence: Secondary | ICD-10-CM

## 2018-05-01 NOTE — Assessment & Plan Note (Signed)
Multiple issues.  Relatively deconditioned from previous surgery and rehab stay.  Not yet back to baseline.  Still walking with walker.  Advised not to drive yet.  No focal weakness to suggest an acute neurologic issue.  Discussed with patient.  Overall deconditioning likely exacerbated by poor night of sleep last night due to urinary frequency.  Catheter is removed.  He does have some irritation and slight erosion on the glans.  Advised to use topical antifungal.  Catheter has been removed already by urology.  Requesting notes from urology in the meantime.  Reasonable to try Flomax in the meantime with routine cautions.  Given his deconditioning, recheck routine labs today.  Still okay for outpatient follow-up.  Discussed with patient.  I appreciate the help of all involved. >25 minutes spent in face to face time with patient, >50% spent in counselling or coordination of care.

## 2018-05-01 NOTE — Telephone Encounter (Signed)
I believe patient had recently seen [facility].  Per patient, they removed his catheter.  He still has some irritation on the glans with a slight erosion.  I advised him to use a topical antifungal in the meantime.  If he is not improved I am going to need the patient to follow-up with them.  Please send in this note as an FYI.  Also need most recent office visit note from urology.  Thanks.

## 2018-05-02 NOTE — Telephone Encounter (Addendum)
Patient advised.  Patient says he needs a prescription for under garments for urinary incontinence.  Patient states he has been paying for them out of pocket but thinks his insurance will cover them with a prescription.  Patient asks that you please write the prescription and he will have someone pick it up for him because he doesn't know where he will try to purchase them from.  Patient uses one or 2 per day.

## 2018-05-03 DIAGNOSIS — R32 Unspecified urinary incontinence: Secondary | ICD-10-CM | POA: Insufficient documentation

## 2018-05-03 NOTE — Telephone Encounter (Signed)
Letter placed at front desk for pick up

## 2018-05-03 NOTE — Telephone Encounter (Signed)
Letter done.  Thanks. Please make sure it printed.

## 2018-05-04 DIAGNOSIS — M25551 Pain in right hip: Secondary | ICD-10-CM | POA: Diagnosis not present

## 2018-05-04 DIAGNOSIS — R2689 Other abnormalities of gait and mobility: Secondary | ICD-10-CM | POA: Diagnosis not present

## 2018-05-06 DIAGNOSIS — R2689 Other abnormalities of gait and mobility: Secondary | ICD-10-CM | POA: Diagnosis not present

## 2018-05-06 DIAGNOSIS — M25551 Pain in right hip: Secondary | ICD-10-CM | POA: Diagnosis not present

## 2018-05-10 DIAGNOSIS — M25551 Pain in right hip: Secondary | ICD-10-CM | POA: Diagnosis not present

## 2018-05-10 DIAGNOSIS — R2689 Other abnormalities of gait and mobility: Secondary | ICD-10-CM | POA: Diagnosis not present

## 2018-05-13 DIAGNOSIS — R2689 Other abnormalities of gait and mobility: Secondary | ICD-10-CM | POA: Diagnosis not present

## 2018-05-13 DIAGNOSIS — M25551 Pain in right hip: Secondary | ICD-10-CM | POA: Diagnosis not present

## 2018-05-17 DIAGNOSIS — R2689 Other abnormalities of gait and mobility: Secondary | ICD-10-CM | POA: Diagnosis not present

## 2018-05-17 DIAGNOSIS — M25551 Pain in right hip: Secondary | ICD-10-CM | POA: Diagnosis not present

## 2018-05-17 NOTE — Progress Notes (Signed)
[facility] Consultation      MRN# [id number] Christopher Santiago 1931/05/26  Assessment and Plan  COPD with bullous emphysema seen on CT scanning.  --Chronic dyspnea with chronic cough. -He has been on perforomist, nebs, Spiriva, Symbicort in the past, he has stopped both due to ineffectiveness. --Finds Ventolin effective, will continue.  Obstructive sleep apnea  --Continue using CPAP every night.  --Reminded him to try to put the CPAP back on whenever he takes it off.   Bronchiectasis.  --Seen on CT scanning, he has excess mucus production in the am, but other than that does well.   Lung nodule.  --New lung nodule has disappeared. Other nodules are unchanged.  --No need for repeat continued surveillance.   History of Pulmonary embolism.  -- No longer on eliquis, s/p filter in right upper extremity.   Chronic Rhinitis.  --Not better with flonase, likely vasomotor rhinitis.  --Will try nasal ipratropium.   Orders Placed This Encounter  Procedures  . CT Chest Wo Contrast-- in 6 months.    Meds ordered this encounter  Medications  . ipratropium (ATROVENT) 0.06 % nasal spray    Sig: Place 2 sprays into the nose 4 (four) times daily.    Dispense:  24 mL    Refill:  11   Return in about 6 months (around 11/18/2018).    CC: Chief Complaint  Patient presents with  . COPD    pt here for results of CT done on yesterday  . Shortness of Breath    with exertion  . Cough      Brief History: Christopher Santiago first saw the [facility] in 05/2013 for COPD and cough. He had a lot of post nasal drip. PFT's showed moderate obstruction in 04/2013 (Ratio 47%, FEV1 1.73L, 54% predicted), noted to have a sleep study, on CPAP of 10.   Events since last visit. Patient presents today for follow-up visit, he has a history of COPD, obstructive sleep apnea, lung nodule. The patient has a history of lung nodules which have been stable since 2016.  However on  most recent scan he was noted to have a left lower lobe nodule which was new.  He was therefore sent for repeat scan, which shows that nodule has resolved, other nodules are stable.  He continues to have dyspnea on exertion, he is completing pulm rehab and finds that it helps.  He is using ventolin 2-3 times per day. He is not using nebs as does not feel that they help.   He was on spiriva in the past but did not feel that it helped. He continues to have a chronic cough, he does not taking anything for it currently as it is not very bothersome. He has nasal drainage constantly.   He has been using cpap every night, he recently got a new machine and appears to be doing well with it.   Download data: **30 days as of 11/11/17; usage greater than 4 hours is 20/30 days.  Uses is 5 hours 21 minutes.  Set pressure is 10.  Residual AHI is 2.9. **9/1-9/30/18; uses greater than 4 hours equals 0%. Used 11/30 days. Resume usage on days used is 2 hours 18 minutes. CPAP set at 10 cm of H2O. Residual AHI equals 7.7. **5/20-6/18/18; uses greater than 4 hours equals 43%. Used 27/30 days. Average usage is 4 hours 31 minutes. CPAP set at 10 cm H2O. Residual AHI equals 5.2  Ct chest 11/10/17 in  comparison with 11/06/16 On my personal review there are new nodule on the LLL. Other changes are stable,  there is scarring/nodule in the RUL, which is unchanged. Mild bullous emphysema, bronchiectasis.    6MWT- total distance 748 feet, lowest saturation 95%, highest heart rate 89  Medication:     Current Outpatient Medications:  .  albuterol (VENTOLIN HFA) 108 (90 Base) MCG/ACT inhaler, Inhale 1-2 puffs into the lungs every 6 (six) hours as needed for wheezing or shortness of breath. (Patient taking differently: Inhale 2 puffs into the lungs every 6 (six) hours as needed for wheezing or shortness of breath. ), Disp: 18 g, Rfl: 5 .  allopurinol (ZYLOPRIM) 100 MG tablet, Take 1 tablet (100 mg total) by mouth daily., Disp:  90 tablet, Rfl: 3 .  aspirin 81 MG EC tablet, Take 81 mg by mouth daily.  , Disp: , Rfl:  .  chlorpheniramine-HYDROcodone (TUSSIONEX PENNKINETIC ER) 10-8 MG/5ML SUER, Take 5 mLs by mouth at bedtime as needed for cough., Disp: 140 mL, Rfl: 0 .  Cholecalciferol 2000 units CAPS, Take 2,000 Units by mouth daily. , Disp: , Rfl:  .  citalopram (CELEXA) 40 MG tablet, Take 1.5 tablets (60 mg total) by mouth daily., Disp: 135 tablet, Rfl: 3 .  docusate sodium (COLACE) 100 MG capsule, Take 1 capsule (100 mg total) by mouth 2 (two) times daily., Disp: 10 capsule, Rfl: 0 .  indomethacin (INDOCIN) 50 MG capsule, TAKE 1 CAPSULE BY MOUTH 2 TIMES DAILY AS NEEDED FOR PAIN WITH FOOD, Disp: , Rfl:  .  metoprolol tartrate (LOPRESSOR) 25 MG tablet, Take 1 tablet (25 mg total) by mouth 2 (two) times daily., Disp: 180 tablet, Rfl: 3 .  nystatin cream (MYCOSTATIN), Apply 1 application topically 2 (two) times daily., Disp: 30 g, Rfl: 1 .  simvastatin (ZOCOR) 20 MG tablet, TAKE 1 TABLET BY MOUTH EVERY NIGHT AT BEDTIME, Disp: 30 tablet, Rfl: 1 .  tamsulosin (FLOMAX) 0.4 MG CAPS capsule, TAKE 1 CAPSULE BY MOUTH ONCE DAILY, Disp: 90 capsule, Rfl: 2   Review of Systems  Constitutional: Negative for chills, fever and malaise/fatigue.  HENT: Negative for hearing loss.   Eyes: Negative for blurred vision.  Respiratory: Positive for sputum production and shortness of breath. Negative for cough and wheezing.   Cardiovascular: Negative for chest pain.  Gastrointestinal: Negative for heartburn and nausea.  Musculoskeletal: Negative for myalgias.  Skin: Negative for itching and rash.  Neurological: Positive for weakness. Negative for dizziness and headaches.  Endo/Heme/Allergies: Does not bruise/bleed easily.  Psychiatric/Behavioral: Negative for depression.      Allergies:  Tessalon [benzonatate]  Physical Examination:  VS: There were no vitals taken for this visit.  General Appearance: No distress  HEENT: PERRLA, no  ptosis, no other lesions noticed Pulmonary: Coarse upper airway sounds, diaphragmatic excursion normal.No wheezing, No rales   Cardiovascular:  Normal S1,S2.  No m/r/g.     Abdomen:Exam: Benign, Soft, non-tender, No masses  Skin:   warm, no rashes, no ecchymosis  Extremities: normal, no cyanosis, clubbing, warm with normal capillary refill.        Updated Medication List Outpatient Encounter Medications as of 05/19/2018  Medication Sig  . albuterol (VENTOLIN HFA) 108 (90 Base) MCG/ACT inhaler Inhale 1-2 puffs into the lungs every 6 (six) hours as needed for wheezing or shortness of breath. (Patient taking differently: Inhale 2 puffs into the lungs every 6 (six) hours as needed for wheezing or shortness of breath. )  . allopurinol (ZYLOPRIM) 100 MG  tablet Take 1 tablet (100 mg total) by mouth daily.  Marland Kitchen aspirin 81 MG EC tablet Take 81 mg by mouth daily.    . chlorpheniramine-HYDROcodone (TUSSIONEX PENNKINETIC ER) 10-8 MG/5ML SUER Take 5 mLs by mouth at bedtime as needed for cough.  . Cholecalciferol 2000 units CAPS Take 2,000 Units by mouth daily.   . citalopram (CELEXA) 40 MG tablet Take 1.5 tablets (60 mg total) by mouth daily.  Marland Kitchen docusate sodium (COLACE) 100 MG capsule Take 1 capsule (100 mg total) by mouth 2 (two) times daily.  . indomethacin (INDOCIN) 50 MG capsule TAKE 1 CAPSULE BY MOUTH 2 TIMES DAILY AS NEEDED FOR PAIN WITH FOOD  . metoprolol tartrate (LOPRESSOR) 25 MG tablet Take 1 tablet (25 mg total) by mouth 2 (two) times daily.  Marland Kitchen nystatin cream (MYCOSTATIN) Apply 1 application topically 2 (two) times daily.  . simvastatin (ZOCOR) 20 MG tablet TAKE 1 TABLET BY MOUTH EVERY NIGHT AT BEDTIME  . tamsulosin (FLOMAX) 0.4 MG CAPS capsule TAKE 1 CAPSULE BY MOUTH ONCE DAILY   No facility-administered encounter medications on file as of 05/19/2018.      Thank  you for the visitation and for allowing  [facility] to assist in the care of your patient. Our  recommendations are noted above.  Please contact us if we can be of further service.  Marda Stalker M.D.  Pulmonary and Critical Care Office Number: [phone]

## 2018-05-18 ENCOUNTER — Ambulatory Visit
Admission: RE | Admit: 2018-05-18 | Discharge: 2018-05-18 | Disposition: A | Discharge status: Home / Self Care | Payer: Medicare Other | Source: Ambulatory Visit | Attending: Internal Medicine | Admitting: Internal Medicine

## 2018-05-18 DIAGNOSIS — J439 Emphysema, unspecified: Secondary | ICD-10-CM | POA: Diagnosis not present

## 2018-05-18 DIAGNOSIS — I700 Atherosclerosis of aorta: Secondary | ICD-10-CM | POA: Insufficient documentation

## 2018-05-18 DIAGNOSIS — R918 Other nonspecific abnormal finding of lung field: Secondary | ICD-10-CM | POA: Diagnosis not present

## 2018-05-19 ENCOUNTER — Encounter: Payer: Self-pay | Admitting: Internal Medicine

## 2018-05-19 ENCOUNTER — Ambulatory Visit (INDEPENDENT_AMBULATORY_CARE_PROVIDER_SITE_OTHER): Payer: Medicare Other | Admitting: Internal Medicine

## 2018-05-19 VITALS — BP 150/60 | HR 68 | Resp 16 | Ht 70.0 in | Wt 196.0 lb

## 2018-05-19 DIAGNOSIS — I2510 Atherosclerotic heart disease of native coronary artery without angina pectoris: Secondary | ICD-10-CM | POA: Diagnosis not present

## 2018-05-19 DIAGNOSIS — J449 Chronic obstructive pulmonary disease, unspecified: Secondary | ICD-10-CM

## 2018-05-19 DIAGNOSIS — Z9989 Dependence on other enabling machines and devices: Secondary | ICD-10-CM | POA: Diagnosis not present

## 2018-05-19 DIAGNOSIS — G4733 Obstructive sleep apnea (adult) (pediatric): Secondary | ICD-10-CM | POA: Diagnosis not present

## 2018-05-19 DIAGNOSIS — J300 Vasomotor rhinitis: Secondary | ICD-10-CM

## 2018-05-19 MED ORDER — IPRATROPIUM BROMIDE 0.06 % NA SOLN: 2.0000 | Freq: Four times a day (QID) | NASAL | 11 refills | Status: DC

## 2018-05-19 NOTE — Patient Instructions (Signed)
Continue cpap every night.  Will start nasal ipratropium.

## 2018-05-20 DIAGNOSIS — M25551 Pain in right hip: Secondary | ICD-10-CM | POA: Diagnosis not present

## 2018-05-20 DIAGNOSIS — R2689 Other abnormalities of gait and mobility: Secondary | ICD-10-CM | POA: Diagnosis not present

## 2018-05-24 DIAGNOSIS — Z96649 Presence of unspecified artificial hip joint: Secondary | ICD-10-CM | POA: Diagnosis not present

## 2018-05-27 ENCOUNTER — Other Ambulatory Visit: Payer: Self-pay | Admitting: Cardiovascular Disease

## 2018-06-01 DIAGNOSIS — N3000 Acute cystitis without hematuria: Secondary | ICD-10-CM | POA: Diagnosis not present

## 2018-06-01 DIAGNOSIS — N401 Enlarged prostate with lower urinary tract symptoms: Secondary | ICD-10-CM | POA: Diagnosis not present

## 2018-06-01 DIAGNOSIS — N3941 Urge incontinence: Secondary | ICD-10-CM | POA: Diagnosis not present

## 2018-06-15 DIAGNOSIS — N3941 Urge incontinence: Secondary | ICD-10-CM | POA: Diagnosis not present

## 2018-06-15 DIAGNOSIS — N35013 Post-traumatic anterior urethral stricture: Secondary | ICD-10-CM | POA: Diagnosis not present

## 2018-06-15 DIAGNOSIS — N401 Enlarged prostate with lower urinary tract symptoms: Secondary | ICD-10-CM | POA: Diagnosis not present

## 2018-06-30 DIAGNOSIS — N401 Enlarged prostate with lower urinary tract symptoms: Secondary | ICD-10-CM | POA: Diagnosis not present

## 2018-06-30 DIAGNOSIS — R350 Frequency of micturition: Secondary | ICD-10-CM | POA: Diagnosis not present

## 2018-07-02 ENCOUNTER — Other Ambulatory Visit: Payer: Self-pay | Admitting: Cardiovascular Disease

## 2018-08-01 ENCOUNTER — Other Ambulatory Visit: Payer: Self-pay | Admitting: Family Medicine

## 2018-08-01 NOTE — Telephone Encounter (Signed)
Electronic refill request Last refill 08/10/17 #90/3 last office visit 04/28/18 See lab results 04/28/18 No follow-up appointment scheduled

## 2018-08-01 NOTE — Telephone Encounter (Signed)
Sent.  Needs 74min OV visit with labs at the visit.  Doesn't have to fast.  Thanks.

## 2018-08-02 NOTE — Telephone Encounter (Signed)
Appointment scheduled.

## 2018-08-08 DIAGNOSIS — N35013 Post-traumatic anterior urethral stricture: Secondary | ICD-10-CM | POA: Diagnosis not present

## 2018-08-08 DIAGNOSIS — R351 Nocturia: Secondary | ICD-10-CM | POA: Diagnosis not present

## 2018-08-08 DIAGNOSIS — N3000 Acute cystitis without hematuria: Secondary | ICD-10-CM | POA: Diagnosis not present

## 2018-08-08 DIAGNOSIS — N401 Enlarged prostate with lower urinary tract symptoms: Secondary | ICD-10-CM | POA: Diagnosis not present

## 2018-08-08 DIAGNOSIS — R350 Frequency of micturition: Secondary | ICD-10-CM | POA: Diagnosis not present

## 2018-08-09 ENCOUNTER — Ambulatory Visit (INDEPENDENT_AMBULATORY_CARE_PROVIDER_SITE_OTHER): Payer: Medicare Other | Admitting: Family Medicine

## 2018-08-09 ENCOUNTER — Encounter: Payer: Self-pay | Admitting: Family Medicine

## 2018-08-09 VITALS — BP 128/60 | HR 57 | Temp 98.7°F | Wt 195.5 lb

## 2018-08-09 DIAGNOSIS — J479 Bronchiectasis, uncomplicated: Secondary | ICD-10-CM | POA: Diagnosis not present

## 2018-08-09 DIAGNOSIS — R399 Unspecified symptoms and signs involving the genitourinary system: Secondary | ICD-10-CM

## 2018-08-09 DIAGNOSIS — Z23 Encounter for immunization: Secondary | ICD-10-CM

## 2018-08-09 DIAGNOSIS — I2609 Other pulmonary embolism with acute cor pulmonale: Secondary | ICD-10-CM

## 2018-08-09 DIAGNOSIS — R739 Hyperglycemia, unspecified: Secondary | ICD-10-CM

## 2018-08-09 DIAGNOSIS — D649 Anemia, unspecified: Secondary | ICD-10-CM

## 2018-08-09 DIAGNOSIS — I2510 Atherosclerotic heart disease of native coronary artery without angina pectoris: Secondary | ICD-10-CM

## 2018-08-09 DIAGNOSIS — M1611 Unilateral primary osteoarthritis, right hip: Secondary | ICD-10-CM | POA: Diagnosis not present

## 2018-08-09 LAB — CBC WITH DIFFERENTIAL/PLATELET
BASOS PCT: 0.3 % (ref 0.0–3.0)
Basophils Absolute: 0 10*3/uL (ref 0.0–0.1)
EOS PCT: 0.7 % (ref 0.0–5.0)
Eosinophils Absolute: 0.1 10*3/uL (ref 0.0–0.7)
HCT: 33.3 % — ABNORMAL LOW (ref 39.0–52.0)
Hemoglobin: 11 g/dL — ABNORMAL LOW (ref 13.0–17.0)
LYMPHS ABS: 1.6 10*3/uL (ref 0.7–4.0)
Lymphocytes Relative: 16.9 % (ref 12.0–46.0)
MCHC: 33 g/dL (ref 30.0–36.0)
MCV: 92.4 fl (ref 78.0–100.0)
MONOS PCT: 7.7 % (ref 3.0–12.0)
Monocytes Absolute: 0.7 10*3/uL (ref 0.1–1.0)
NEUTROS PCT: 74.4 % (ref 43.0–77.0)
Neutro Abs: 7.2 10*3/uL (ref 1.4–7.7)
Platelets: 197 10*3/uL (ref 150.0–400.0)
RBC: 3.61 Mil/uL — AB (ref 4.22–5.81)
RDW: 14.1 % (ref 11.5–15.5)
WBC: 9.7 10*3/uL (ref 4.0–10.5)

## 2018-08-09 LAB — HEMOGLOBIN A1C: Hgb A1c MFr Bld: 6.2 % (ref 4.6–6.5)

## 2018-08-09 NOTE — Patient Instructions (Signed)
Go to the lab on the way out.  We'll contact you with your lab report. Don't change your meds for now.  Take care.  Glad to see you.  I'll see about follow up labs after I get your urology notes and the labs from today.

## 2018-08-09 NOTE — Progress Notes (Signed)
Bronchiectasis d/w pt.   --Seen on CT scanning prev, he has excess mucus production in the am, but other than that does well.  Still using albuterol prn.  D/w pt.    Lung nodule hx d/w pt.   --New lung nodule has disappeared. Other nodules are unchanged.  --No need for repeat continued surveillance.   History of Pulmonary embolism.  -- No longer on eliquis.    He has seen urology about penile changes that were possibly exacerbated by prev catheter use.  Saw urology yesterday.  He has urinary frequency but not a lot of urine per void.  He has recently started unrecalled meds from urology.  I am awaiting notes.  D/w pt.   His hip is clearly better after surgery.  He has some pain laying down but is better in the meantime.  He is still working on balance. Fall cautions d/w pt, esp using cane.  No falls in the last month.    He had scraped his R forearm but that is scabbed and healing.    Due for repeat labs today re: sugar.  D/w pt.    He is helping his wife a lot at home.  He is getting by.  She is going to require surgery this fall.    Anemia f/u, no blood in stools or seen in urine.  Due for f/u labs.    Flu shot done today.  He is going to f/u with cardiology in the future, d/w pt.  He'll call about f/u.   Meds, vitals, and allergies reviewed.   ROS: Per HPI unless specifically indicated in ROS section   GEN: nad, alert and oriented HEENT: mucous membranes moist NECK: supple w/o LA CV: rrr. PULM: ctab, no inc wob ABD: soft, +bs EXT: no edema SKIN: no acute rash but R forearm has a superficial scrape that is scabbed and healing

## 2018-08-11 ENCOUNTER — Encounter: Payer: Self-pay | Admitting: *Deleted

## 2018-08-11 DIAGNOSIS — D649 Anemia, unspecified: Secondary | ICD-10-CM | POA: Insufficient documentation

## 2018-08-11 DIAGNOSIS — J479 Bronchiectasis, uncomplicated: Secondary | ICD-10-CM | POA: Insufficient documentation

## 2018-08-11 NOTE — Assessment & Plan Note (Signed)
History of, previously treated with Eliquis.  No longer on anticoagulation.

## 2018-08-11 NOTE — Assessment & Plan Note (Signed)
With urinary frequency.  Awaiting notes from urology.  No change in meds at this point.

## 2018-08-11 NOTE — Assessment & Plan Note (Signed)
Status post surgery.  Hip pain is clearly better in the meantime.  Fall cautions discussed with patient.

## 2018-08-11 NOTE — Assessment & Plan Note (Signed)
He has excess mucus production in the morning but otherwise does well.  Continue albuterol as needed.

## 2018-08-11 NOTE — Assessment & Plan Note (Signed)
Recheck labs today pending.  See notes on labs.  No bleeding. >25 minutes spent in face to face time with patient, >50% spent in counselling or coordination of care.

## 2018-08-11 NOTE — Assessment & Plan Note (Signed)
Due for repeat labs today.  See notes on labs.  He agrees.

## 2018-08-14 ENCOUNTER — Telehealth: Payer: Self-pay | Admitting: Family Medicine

## 2018-08-14 NOTE — Telephone Encounter (Signed)
Call patient.  I got the urology notes and reviewed them.  He was asking if he could have his labs done here on follow-up.  The issue is that urology needs other items addressed, other than just his labs.  Per the notes they want to do a flow rate measurement on his urine.  We cannot do that here.  Since he is going to need to give the urine sample at urology anyway, it makes sense to do the follow-up labs they are at that visit. Thanks.

## 2018-08-15 NOTE — Telephone Encounter (Signed)
Patient notified as instructed by telephone and verbalized understanding. 

## 2018-08-23 ENCOUNTER — Other Ambulatory Visit: Payer: Self-pay | Admitting: Family Medicine

## 2018-09-13 ENCOUNTER — Other Ambulatory Visit: Payer: Self-pay | Admitting: Family Medicine

## 2018-09-13 NOTE — Telephone Encounter (Signed)
Electronic refill request Indomethacin Last refill 02/06/18 #180/1 Last office visit 08/09/18

## 2018-09-14 NOTE — Telephone Encounter (Signed)
Sent. Thanks.   

## 2018-09-16 DIAGNOSIS — R351 Nocturia: Secondary | ICD-10-CM | POA: Diagnosis not present

## 2018-09-16 DIAGNOSIS — N401 Enlarged prostate with lower urinary tract symptoms: Secondary | ICD-10-CM | POA: Diagnosis not present

## 2018-09-21 NOTE — Progress Notes (Signed)
Cardiology Office Note  Date:  09/22/2018   ID:  Christopher Santiago, DOB Aug 05, 1931, MRN [id number]  PCP:  Tonia Ghent, MD   Chief Complaint  Patient presents with  . OTHER    6 month f/u c/o low HR, dizziness and right leg/hip pain. Meds reviewed verbally with pt.    HPI:  [name] is a  84 year old man with a history of  diffuse moderate three-vessel coronary artery disease cardiac catheterization in 2006,  PVD of the LE,  hypertension,  hyperlipidemia,  diabetes,  COPD, followed by pulmonary  smoked for 50 years,  obstructive sleep apnea uses CPAP.  diagnosis in December 2015 with DVT and bilateral PE, started on anticoagulation,  also nodule in the lung (benign on biopsy) IVC filter placed, off eliquis, Now on aspirin Ascending aorta dilation  4.2 cm in diameter, on CT scan 10/2016 He presents for followup of his DVT and PE, shortness of breath, coronary artery disease  Reports having recent severe fall onto his right side approximately 3 weeks ago when he was in the garden Trauma to right eye, right arm Still with significant ecchymotic bruising  Reports having recent episodes of dizziness Getting over urinary tract infection, reports he is finished with his antibiotics Has been on Flomax for several months No other medication changes made  Chronic shortness of breath on exertion Incontinence Drinks beer in the evenings Gait instability  Lab work reviewed CBC : HCT 33  Orthostatics done in the office show heart rates in the 40s, blood pressure dropped to 131/66 supine down to 93/46 standing with minimal recovery after 3 minutes, minimal improvement in heart rate, was dizzy  Other records reviewed Echocardiogram February 11, 2018,  moderately dilated left atrium with mild to moderate MR Ejection fraction 16-01% diastolic dysfunction on the elevated right heart pressures 45  Stress test February 25, 2018,  Prior MI anterior wall ejection fraction 48%  EKG  personally reviewed by myself on todays visit Shows normal sinus rhythm rate 51 bpm left axis deviation no significant ST or T wave changes  Other past medical history Previous lower extremity Doppler 11/04/2014 with nearly occlusive thrombus of central portion of the left femoral vein and profunda femoral vein, no DVT on the right CT scan December 13 showed acute large burden of bilateral pulmonary emboli with right heart strain, ascending aortic aneurysm, right upper lobe spiculated cavitary mass concerning for malignancy PET scan December 17 with hypermetabolic cavity right upper lobe lesion worrisome for malignancy Echocardiogram December 13 with normal ejection fraction otherwise normal study  He was started on anticoagulation, eliquis 5 BID  He smoked for 50 years, stopping over 10 years ago.  Previous lab work,Hemoglobin A1c 5.8, total cholesterol 145, LDL 78  CORONARY ANGIOGRAPHY: In 2006, November  .  The left anterior descending artery: In the proximal LAD there was a tubular 30-40% lesion. In the proximal to mid LAD, right at the takeoff of the second diagonal, there was a 50% tubular lesion. The mid LAD there was a 40% tubular lesion. In the lower branch of the first diagonal there is a 99% lesion with subtotal occlusion of the vessel.  3.  The left circumflex was a large vessel. It gave off a large branching ramus, a large OM-1 and moderate-to-large OM-2 and OM-3. In the mid      circumflex there was a 30% lesion, followed by a 50-60% lesion at the takeoff of the OM-2. Distally there was a 30% lesion. In  the upper branch of a large ramus there was a 60-70% lesion. In the OM-1 there was a 50% mid portion, followed by a 70-80% tubular lesion. In the OM-2 there was a 50% proximal lesion.  4.  The right coronary artery was a dominant vessel. There was a 40% proximally, which possibly related to catheter induced spasm -- though it did not change much with nitroglycerin throughout the  proximal portion. There were multiple irregularities about 30-40% stenoses. The mid portion, after the takeoff the RV branch, there was a 40% stenosis. Distally, prior to the takeoff of the PDA, there was 40% stenosis. There was a moderate size PDA and 2 small PLs.   PMH:   has a past medical history of Ascending aortic aneurysm (HCC), Benign prostatic hypertrophy, CAD (coronary artery disease), Cancer ([place]), COPD (chronic obstructive pulmonary disease) ([place]), Depression, Diabetes mellitus, DVT (deep venous thrombosis) ([place]), Hyperlipidemia, Hypertension, Hypogonadism male, Multiple contusions, OSA (obstructive sleep apnea), PE (pulmonary embolism) (10/2014), Pulmonary hypertension ([place]), and Trimalleolar fracture.  PSH:    Past Surgical History:  Procedure Laterality Date  . ANKLE FRACTURE SURGERY Left    unsure if there is metal in the ankle  . CARDIAC CATHETERIZATION  October 11, 2005   dead spot, two small occlusions , EF 50-55-55% mild -Mod Dz   . CATARACT EXTRACTION W/ INTRAOCULAR LENS  IMPLANT, BILATERAL    . EYE SURGERY    . HERNIA REPAIR  07/04/01  . LARYNGOSCOPY     with biopsy, right vocal cord lesion   . PROSTATE SURGERY     not full excision  . TOTAL HIP ARTHROPLASTY Right 04/11/2018   Procedure: TOTAL HIP ARTHROPLASTY ANTERIOR APPROACH;  Surgeon: Lovell Sheehan, MD;  Location: [facility] ORS;  Service: Orthopedics;  Laterality: Right;  . VENA CAVA FILTER PLACEMENT  2015    Current Outpatient Medications  Medication Sig Dispense Refill  . albuterol (VENTOLIN HFA) 108 (90 Base) MCG/ACT inhaler Inhale 1-2 puffs into the lungs every 6 (six) hours as needed for wheezing or shortness of breath. (Patient taking differently: Inhale 2 puffs into the lungs every 6 (six) hours as needed for wheezing or shortness of breath. ) 18 g 5  . allopurinol (ZYLOPRIM) 100 MG tablet TAKE 1 TABLET BY MOUTH ONCE DAILY 90 tablet 1  . aspirin 81 MG EC tablet Take 81 mg by mouth daily.      . Cholecalciferol 2000  units CAPS Take 2,000 Units by mouth daily.     . citalopram (CELEXA) 40 MG tablet Take 1.5 tablets (60 mg total) by mouth daily. 135 tablet 3  . indomethacin (INDOCIN) 50 MG capsule TAKE 1 CAPSULE BY MOUTH 2 TIMES DAILY AS NEEDED FOR PAIN WITH FOOD    . indomethacin (INDOCIN) 50 MG capsule TAKE 1 CAPSULE BY MOUTH 2 TIMES DAILY AS NEEDED FOR PAIN WITH FOOD 180 capsule 1  . ipratropium (ATROVENT) 0.06 % nasal spray Place 2 sprays into the nose 4 (four) times daily. 24 mL 11  . metoprolol tartrate (LOPRESSOR) 25 MG tablet TAKE 1 TABLET BY MOUTH 2 TIMES DAILY 180 tablet 3  . simvastatin (ZOCOR) 20 MG tablet TAKE 1 TABLET BY MOUTH EVERY NIGHT AT BEDTIME 60 tablet 3  . tamsulosin (FLOMAX) 0.4 MG CAPS capsule TAKE 1 CAPSULE BY MOUTH ONCE DAILY 90 capsule 2   No current facility-administered medications for this visit.      Allergies:   Tessalon [benzonatate]   Social History:  The patient  reports that he quit smoking  about 24 years ago. His smoking use included cigarettes. He has a 79.50 pack-year smoking history. He has never used smokeless tobacco. He reports that he drinks about 28.0 standard drinks of alcohol per week. He reports that he does not use drugs.   Family History:   family history includes Diabetes in his mother; Hypertension in his mother.    Review of Systems: Review of Systems  Constitutional: Negative.   HENT: Negative.   Respiratory: Positive for shortness of breath.   Cardiovascular: Negative.   Gastrointestinal: Negative.   Musculoskeletal: Positive for falls.  Neurological: Negative.   Psychiatric/Behavioral: Negative.   All other systems reviewed and are negative.   PHYSICAL EXAM: VS:  BP (!) 121/56 (BP Location: Left Arm, Patient Position: Sitting, Cuff Size: Normal)   Pulse (!) 51   Ht 5\' 9"  (1.753 m)   Wt 194 lb 8 oz (88.2 kg)   BMI 28.72 kg/m  , BMI Body mass index is 28.72 kg/m. Constitutional:  oriented to person, place, and time. No distress.  HENT:   Head: Normocephalic and atraumatic.  Eyes:  no discharge. No scleral icterus.  Neck: Normal range of motion. Neck supple. No JVD present.  Cardiovascular: Normal rate, regular rhythm, normal heart sounds and intact distal pulses. Exam reveals no gallop and no friction rub. No edema No murmur heard. Pulmonary/Chest: Effort normal and breath sounds normal. No stridor. No respiratory distress.  no wheezes.  no rales.  no tenderness.  Abdominal: Soft.  no distension.  no tenderness.  Musculoskeletal: Normal range of motion.  no  tenderness or deformity.  Neurological:  normal muscle tone. Coordination normal. No atrophy Skin: Skin is warm and dry. No rash noted. not diaphoretic.  Psychiatric:  normal mood and affect. behavior is normal. Thought content normal.    Recent Labs: 04/28/2018: ALT 14; BUN 22; Creatinine, Ser 1.07; Potassium 4.5; Sodium 138 08/09/2018: Hemoglobin 11.0; Platelets 197.0    Lipid Panel Lab Results  Component Value Date   CHOL 127 08/10/2017   HDL 76.90 08/10/2017   LDLCALC 33 08/10/2017   TRIG 84.0 08/10/2017      Wt Readings from Last 3 Encounters:  09/22/18 194 lb 8 oz (88.2 kg)  08/09/18 195 lb 8 oz (88.7 kg)  05/19/18 196 lb (88.9 kg)     ASSESSMENT AND PLAN:  Atherosclerosis of native coronary artery of native heart with stable angina pectoris ([place]) -  mid to distal anterior wall perfusion defect, no significant ischemia, suspect old infarct Mildly depressed ejection fraction 45%  Denies any anginal symptoms No ischemic work-up at this time He previously declined cardiac catheterization   PVD (peripheral vascular disease) ([place]) - Plan: EKG 12-Lead Dilated aorta 4.2 cm Periodic CT scan for review We will discuss with him in follow-up  Orthostasis/dizziness Recent fall 3 weeks ago Traumatic drop in blood pressure on today's visit in the setting of bradycardia Reports having recent urinary tract infection We will hold the metoprolol Symptoms  likely exacerbated by Flomax He will monitor blood pressures at home/orthostatics and call our office if they do not improve  COPD Chronic secretions, also sinus drainage Severe emphysema Followed by pulmonary Denies any recent COPD exacerbations  Shortness of breath  multifactorial:  ejection fraction 45-50% Underlying COPD, chronic mucus in his throat and sinuses Previously not interested in diuretics  HYPERCHOLESTEROLEMIA, PURE - Plan: EKG 12-Lead   Numbers at goal Continue statin  Essential hypertension - Plan: EKG 12-Lead Blood pressure is well controlled on today's visit.  No changes made to the medications. Stable  OSA on CPAP -  Compliant on CPAP stable,  History of DVT (deep vein thrombosis) -  Has IVC filter  History of pulmonary embolism -  Filter in place  Ascending aortic aneurysm  currently 4.2 cm in diameter, on CT scan 10/2016 CT scan images reviewed, no significant change Periodic imaging  Falls Unsteady gait, fall 3 weeks ago, early healing Recommend he try to minimize alcohol intake   Total encounter time more than 25 minutes  Greater than 50% was spent in counseling and coordination of care with the patient   Disposition:   F/U  6 months   Orders Placed This Encounter  Procedures  . EKG 12-Lead     Signed, Esmond Plants, M.D., Ph.D. 09/22/2018  [place], [place]

## 2018-09-22 ENCOUNTER — Ambulatory Visit (INDEPENDENT_AMBULATORY_CARE_PROVIDER_SITE_OTHER): Payer: Medicare Other | Admitting: Cardiovascular Disease

## 2018-09-22 ENCOUNTER — Encounter: Payer: Self-pay | Admitting: Cardiovascular Disease

## 2018-09-22 VITALS — BP 121/56 | HR 51 | Ht 69.0 in | Wt 194.5 lb

## 2018-09-22 DIAGNOSIS — I5032 Chronic diastolic (congestive) heart failure: Secondary | ICD-10-CM | POA: Diagnosis not present

## 2018-09-22 DIAGNOSIS — R0602 Shortness of breath: Secondary | ICD-10-CM

## 2018-09-22 DIAGNOSIS — I272 Pulmonary hypertension, unspecified: Secondary | ICD-10-CM | POA: Diagnosis not present

## 2018-09-22 DIAGNOSIS — I739 Peripheral vascular disease, unspecified: Secondary | ICD-10-CM | POA: Diagnosis not present

## 2018-09-22 DIAGNOSIS — I444 Left anterior fascicular block: Secondary | ICD-10-CM

## 2018-09-22 DIAGNOSIS — I25118 Atherosclerotic heart disease of native coronary artery with other forms of angina pectoris: Secondary | ICD-10-CM | POA: Diagnosis not present

## 2018-09-22 DIAGNOSIS — I4510 Unspecified right bundle-branch block: Secondary | ICD-10-CM

## 2018-09-22 DIAGNOSIS — I2510 Atherosclerotic heart disease of native coronary artery without angina pectoris: Secondary | ICD-10-CM | POA: Diagnosis not present

## 2018-09-22 DIAGNOSIS — I77810 Thoracic aortic ectasia: Secondary | ICD-10-CM | POA: Diagnosis not present

## 2018-09-22 DIAGNOSIS — E782 Mixed hyperlipidemia: Secondary | ICD-10-CM

## 2018-09-22 HISTORY — DX: Unspecified right bundle-branch block: I45.10

## 2018-09-22 HISTORY — DX: Left anterior fascicular block: I44.4

## 2018-09-22 NOTE — Patient Instructions (Addendum)
Medication Instructions:   Please stop the metoprolol  Talk with urology about the flomax Blood pressure dropping  131/66 down to 93/50 with standing We will send information to Dr. Jeffie Pollock, urology, allied urology  If you need a refill on your cardiac medications before your next appointment, please call your pharmacy.   Lab work: No new labs needed  If you have labs (blood work) drawn today and your tests are completely normal, you will receive your results only by: Marland Kitchen MyChart Message (if you have MyChart) OR . A paper copy in the mail If you have any lab test that is abnormal or we need to change your treatment, we will call you to review the results.   Testing/Procedures: No new testing needed   Follow-Up: At [facility], you and your health needs are our priority.  As part of our continuing mission to provide you with exceptional heart care, we have created designated Provider Care Teams.  These Care Teams include your primary Cardiologist (physician) and Advanced Practice Providers (APPs -  Physician Assistants and Nurse Practitioners) who all work together to provide you with the care you need, when you need it.  . You will need a follow up appointment in 6 months .   Please call our office 2 months in advance to schedule this appointment.    . Providers on your designated Care Team:   . Murray Hodgkins, NP . Christell Faith, PA-C . Marrianne Mood, PA-C  Any Other Special Instructions Will Be Listed Below (If Applicable).  For educational health videos Log in to : [email or web address] Or : [email or web address], password : [email or web address]

## 2018-09-26 ENCOUNTER — Ambulatory Visit: Payer: Medicare Other | Admitting: Family Medicine

## 2018-10-03 ENCOUNTER — Ambulatory Visit (INDEPENDENT_AMBULATORY_CARE_PROVIDER_SITE_OTHER): Payer: Medicare Other | Admitting: Family Medicine

## 2018-10-03 ENCOUNTER — Encounter: Payer: Self-pay | Admitting: Family Medicine

## 2018-10-03 DIAGNOSIS — I2510 Atherosclerotic heart disease of native coronary artery without angina pectoris: Secondary | ICD-10-CM

## 2018-10-03 DIAGNOSIS — R399 Unspecified symptoms and signs involving the genitourinary system: Secondary | ICD-10-CM | POA: Diagnosis not present

## 2018-10-03 NOTE — Patient Instructions (Signed)
Let me check with cardiology in the meantime but I think it will make sense to proceed with the urology procedure.   Take care.  Glad to see you.

## 2018-10-03 NOTE — Progress Notes (Signed)
He is using a quad cane at baseline. D/w pt about fall cautions.    He has a black eye with a bruise inferior the the R orbit.  He got tangled up in the garden (he was caught on a rose bush and didn't have his cane) and fell about 1 month ago.  Discussed.  His wife isn't going well and she had to push back her surgery date.    Preop eval.  We talked about pros and cons of surgery.  He has plans for TURP per urology.  His urine stream isn't good, with nocturia up to 6x/night.  It is clearly disruptive.  No blood in urine.  No FCNAVD.    He was able to tolerate hip surgery this year.  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    Lower BP likely exacerbated by flomax. If he can stop flomax (via TURP), then his situation may improve.  Discussed.  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  He can walk up to 100 yards.  Still using CPAP.  He isn't getting chest pain.  H/o mildly depressed ejection fraction 45%   PMH and SH reviewed  ROS: Per HPI unless specifically indicated in ROS section   Meds, vitals, and allergies reviewed.   GEN: nad, alert and oriented HEENT: mucous membranes moist, black eye with a bruise inferior the the R orbit.  Clear rhinorrhea noted NECK: supple w/o LA CV: rrr. PULM: ctab, no inc wob ABD: soft, +bs EXT: no edema SKIN: no acute rash Walking with cane at baseline.

## 2018-10-07 NOTE — Assessment & Plan Note (Signed)
We talked about his situation overall.  He has nocturia that is clearly disruptive.  He has a low blood pressure noted.  If he is able to get off of Flomax and this may help with his blood pressure regulation.  He was able to tolerate hip surgery this year.  He still has a vena cava filter in place.  It is not likely that waiting 6 months or a year would improve his overall clinical situation.  If he is going to go ahead with surgery it likely makes sense to do so in the relatively near future.  I will discuss this with cardiology but at this point it appears that he is appropriately low risk for surgery.  Discussed with patient.  He agrees. >25 minutes spent in face to face time with patient, >50% spent in counselling or coordination of care.

## 2018-10-11 ENCOUNTER — Other Ambulatory Visit: Payer: Self-pay

## 2018-10-13 ENCOUNTER — Telehealth: Payer: Self-pay | Admitting: Family Medicine

## 2018-10-13 NOTE — Telephone Encounter (Deleted)
-----   Message from Minna Merritts, MD sent at 10/08/2018  5:19 PM EST ----- He would be at least moderate risk given COPD and underlying cardiac disease I think fixing his prostate may help with recurrent urinary tract infections and need for Flomax which is causing orthostasis and increasing risk of falls Benefit of doing the procedure likely outweighs the risks thx TG  ----- Message ----- From: Tonia Ghent, MD Sent: 10/07/2018   6:32 AM EST To: Minna Merritts, MD  Please let me know which you think.  It is not likely that waiting 6 months or a year would improve his overall clinical situation.  If he is going to go ahead with surgery with urology it likely makes sense to do so in the relatively near future.  I think he is likely appropriately low risk for surgery.  I wanted your input.  I can update urology after that.  Thanks.  Brigitte Pulse

## 2018-10-13 NOTE — Telephone Encounter (Signed)
Please send a copy of this note to Dr. Jeffie Pollock with urology.   I have communicated with Dr. Rockey Situ.   Per Dr. Rockey Situ, the patient would be at least moderate risk given COPD and underlying cardiac disease  Fixing his prostate may help with recurrent urinary tract infections and need for Flomax which is causing orthostasis and increasing risk of falls.  The benefit of doing the procedure likely outweighs the risks.  I think this is a situation where the patient appears to be appropriate for surgery and it makes sense to proceed.  I agree with Dr. Rockey Situ.   I thank all involved.   Elsie Stain

## 2018-10-22 ENCOUNTER — Other Ambulatory Visit: Payer: Self-pay | Admitting: Family Medicine

## 2018-10-24 ENCOUNTER — Other Ambulatory Visit: Payer: Self-pay | Admitting: Urology

## 2018-11-04 ENCOUNTER — Encounter (HOSPITAL_COMMUNITY): Payer: Self-pay

## 2018-11-04 NOTE — Patient Instructions (Addendum)
Your procedure is scheduled on: Thursday, Dec. 19, 2019   Surgery Time:  8:43AM-10:08AM   Report to [facility] Main  Entrance    Report to admitting at 6:45 AM   Call this number if you have problems the morning of surgery [phone]   Bring CPAP mask and tubing day of surgery   Do not eat food or drink liquids :After Midnight.   Brush your teeth the morning of surgery.   Do NOT smoke after Midnight   Take these medicines the morning of surgery with A SIP OF WATER: Allopurinol, Celexa   Bring Asthma Inhaler day of surgery  DO NOT TAKE ANY DIABETIC MEDICATIONS DAY OF YOUR SURGERY                               You may not have any metal on your body including hair pins, jewelry, and body piercings             Do not wear make-up, lotions, powders, perfumes/cologne, or deodorant             Do not wear nail polish.  Do not shave  48 hours prior to surgery.              Men may shave face and neck.   Do not bring valuables to the hospital. [place].   Contacts, dentures or bridgework may not be worn into surgery.   Leave suitcase in the car. After surgery it may be brought to your room.    Patients discharged the day of surgery will not be allowed to drive home.   Name and phone number of your driver:   Special Instructions: Bring a copy of your healthcare power of attorney and living will documents         the day of surgery if you haven't scanned them in before.              Please read over the following fact sheets you were given: [facility] - Preparing for Surgery Before surgery, you can play an important role.  Because skin is not sterile, your skin needs to be as free of germs as possible.  You can reduce the number of germs on your skin by washing with CHG (chlorahexidine gluconate) soap before surgery.  CHG is an antiseptic cleaner which kills germs and bonds with the skin to continue killing germs  even after washing. Please DO NOT use if you have an allergy to CHG or antibacterial soaps.  If your skin becomes reddened/irritated stop using the CHG and inform your nurse when you arrive at Short Stay. Do not shave (including legs and underarms) for at least 48 hours prior to the first CHG shower.  You may shave your face/neck.  Please follow these instructions carefully:  1.  Shower with CHG Soap the night before surgery and the  morning of surgery.  2.  If you choose to wash your hair, wash your hair first as usual with your normal  shampoo.  3.  After you shampoo, rinse your hair and body thoroughly to remove the shampoo.                             4.  Use CHG  as you would any other liquid soap.  You can apply chg directly to the skin and wash.  Gently with a scrungie or clean washcloth.  5.  Apply the CHG Soap to your body ONLY FROM THE NECK DOWN.   Do   not use on face/ open                           Wound or open sores. Avoid contact with eyes, ears mouth and   genitals (private parts).                       Wash face,  Genitals (private parts) with your normal soap.             6.  Wash thoroughly, paying special attention to the area where your    surgery  will be performed.  7.  Thoroughly rinse your body with warm water from the neck down.  8.  DO NOT shower/wash with your normal soap after using and rinsing off the CHG Soap.                9.  Pat yourself dry with a clean towel.            10.  Wear clean pajamas.            11.  Place clean sheets on your bed the night of your first shower and do not  sleep with pets. Day of Surgery : Do not apply any lotions/deodorants the morning of surgery.  Please wear clean clothes to the hospital/surgery center.  FAILURE TO FOLLOW THESE INSTRUCTIONS MAY RESULT IN THE CANCELLATION OF YOUR SURGERY  PATIENT SIGNATURE_________________________________  NURSE  SIGNATURE__________________________________  ________________________________________________________________________

## 2018-11-04 NOTE — Pre-Procedure Instructions (Signed)
The following are in epic: Surgical clearance Dr. Damita Dunnings 10/07/2018 Last office visit note Dr. Rockey Situ 09/22/2018 EKG 09/22/2018 ECHO 02/11/2018 CT chest 05/18/2018

## 2018-11-07 ENCOUNTER — Inpatient Hospital Stay (HOSPITAL_COMMUNITY): Admission: RE | Admit: 2018-11-07 | Payer: Medicare Other | Source: Ambulatory Visit

## 2018-11-08 ENCOUNTER — Encounter (HOSPITAL_COMMUNITY)
Admission: RE | Admit: 2018-11-08 | Discharge: 2018-11-08 | Disposition: A | Discharge status: Home / Self Care | Payer: Medicare Other | Source: Ambulatory Visit | Attending: Urology | Admitting: Urology

## 2018-11-08 ENCOUNTER — Encounter (HOSPITAL_COMMUNITY): Payer: Self-pay

## 2018-11-08 ENCOUNTER — Other Ambulatory Visit: Payer: Self-pay

## 2018-11-08 DIAGNOSIS — Z86718 Personal history of other venous thrombosis and embolism: Secondary | ICD-10-CM | POA: Diagnosis not present

## 2018-11-08 DIAGNOSIS — Z7982 Long term (current) use of aspirin: Secondary | ICD-10-CM | POA: Diagnosis not present

## 2018-11-08 DIAGNOSIS — N401 Enlarged prostate with lower urinary tract symptoms: Secondary | ICD-10-CM

## 2018-11-08 DIAGNOSIS — I509 Heart failure, unspecified: Secondary | ICD-10-CM | POA: Diagnosis not present

## 2018-11-08 DIAGNOSIS — R338 Other retention of urine: Secondary | ICD-10-CM | POA: Diagnosis not present

## 2018-11-08 DIAGNOSIS — J449 Chronic obstructive pulmonary disease, unspecified: Secondary | ICD-10-CM | POA: Diagnosis not present

## 2018-11-08 DIAGNOSIS — E119 Type 2 diabetes mellitus without complications: Secondary | ICD-10-CM

## 2018-11-08 DIAGNOSIS — Z79899 Other long term (current) drug therapy: Secondary | ICD-10-CM | POA: Diagnosis not present

## 2018-11-08 DIAGNOSIS — F329 Major depressive disorder, single episode, unspecified: Secondary | ICD-10-CM | POA: Diagnosis not present

## 2018-11-08 DIAGNOSIS — Z01818 Encounter for other preprocedural examination: Secondary | ICD-10-CM | POA: Insufficient documentation

## 2018-11-08 DIAGNOSIS — Q540 Hypospadias, balanic: Secondary | ICD-10-CM | POA: Diagnosis not present

## 2018-11-08 DIAGNOSIS — Z87891 Personal history of nicotine dependence: Secondary | ICD-10-CM | POA: Diagnosis not present

## 2018-11-08 DIAGNOSIS — N138 Other obstructive and reflux uropathy: Secondary | ICD-10-CM

## 2018-11-08 DIAGNOSIS — Z87442 Personal history of urinary calculi: Secondary | ICD-10-CM | POA: Diagnosis not present

## 2018-11-08 DIAGNOSIS — I739 Peripheral vascular disease, unspecified: Secondary | ICD-10-CM | POA: Diagnosis not present

## 2018-11-08 DIAGNOSIS — N35919 Unspecified urethral stricture, male, unspecified site: Secondary | ICD-10-CM | POA: Diagnosis not present

## 2018-11-08 DIAGNOSIS — I25119 Atherosclerotic heart disease of native coronary artery with unspecified angina pectoris: Secondary | ICD-10-CM | POA: Diagnosis not present

## 2018-11-08 DIAGNOSIS — I110 Hypertensive heart disease with heart failure: Secondary | ICD-10-CM | POA: Diagnosis not present

## 2018-11-08 DIAGNOSIS — M1990 Unspecified osteoarthritis, unspecified site: Secondary | ICD-10-CM | POA: Diagnosis not present

## 2018-11-08 DIAGNOSIS — G4730 Sleep apnea, unspecified: Secondary | ICD-10-CM | POA: Diagnosis not present

## 2018-11-08 DIAGNOSIS — N320 Bladder-neck obstruction: Secondary | ICD-10-CM | POA: Diagnosis not present

## 2018-11-08 HISTORY — DX: Complete traumatic metacarpophalangeal amputation of unspecified finger, initial encounter: S68.119A

## 2018-11-08 HISTORY — DX: Fatty (change of) liver, not elsewhere classified: K76.0

## 2018-11-08 HISTORY — DX: Unspecified osteoarthritis, unspecified site: M19.90

## 2018-11-08 HISTORY — DX: Personal history of other diseases of the digestive system: Z87.19

## 2018-11-08 HISTORY — DX: Diverticulosis of large intestine without perforation or abscess without bleeding: K57.30

## 2018-11-08 HISTORY — DX: Dizziness and giddiness: R42

## 2018-11-08 HISTORY — DX: Cardiomegaly: I51.7

## 2018-11-08 HISTORY — DX: Left anterior fascicular block: I44.4

## 2018-11-08 HISTORY — DX: Unspecified symptoms and signs involving the genitourinary system: R39.9

## 2018-11-08 HISTORY — DX: Chronic cough: R05.3

## 2018-11-08 HISTORY — DX: Other specified disorders of nose and nasal sinuses: J34.89

## 2018-11-08 HISTORY — DX: Nocturia: R35.1

## 2018-11-08 HISTORY — DX: Personal history of colon polyps, unspecified: Z86.0100

## 2018-11-08 HISTORY — DX: Unspecified right bundle-branch block: I45.10

## 2018-11-08 HISTORY — DX: Anemia, unspecified: D64.9

## 2018-11-08 HISTORY — DX: Dyspnea, unspecified: R06.00

## 2018-11-08 HISTORY — DX: Peripheral vascular disease, unspecified: I73.9

## 2018-11-08 HISTORY — DX: Heart disease, unspecified: I51.9

## 2018-11-08 HISTORY — DX: Bradycardia, unspecified: R00.1

## 2018-11-08 HISTORY — DX: Pneumonia, unspecified organism: J18.9

## 2018-11-08 HISTORY — DX: Benign neoplasm of right adrenal gland: D35.01

## 2018-11-08 HISTORY — DX: Personal history of colonic polyps: Z86.010

## 2018-11-08 HISTORY — DX: Cough: R05

## 2018-11-08 HISTORY — DX: Other nonspecific abnormal finding of lung field: R91.8

## 2018-11-08 HISTORY — DX: Atherosclerosis of aorta: I70.0

## 2018-11-08 HISTORY — DX: Personal history of urinary calculi: Z87.442

## 2018-11-08 HISTORY — DX: Nonrheumatic mitral (valve) insufficiency: I34.0

## 2018-11-08 HISTORY — DX: Unsteadiness on feet: R26.81

## 2018-11-08 LAB — CBC
HCT: 37.7 % — ABNORMAL LOW (ref 39.0–52.0)
HEMOGLOBIN: 11.6 g/dL — AB (ref 13.0–17.0)
MCH: 30.5 pg (ref 26.0–34.0)
MCHC: 30.8 g/dL (ref 30.0–36.0)
MCV: 99.2 fL (ref 80.0–100.0)
Platelets: 178 10*3/uL (ref 150–400)
RBC: 3.8 MIL/uL — ABNORMAL LOW (ref 4.22–5.81)
RDW: 14.1 % (ref 11.5–15.5)
WBC: 7.3 10*3/uL (ref 4.0–10.5)
nRBC: 0 % (ref 0.0–0.2)

## 2018-11-08 LAB — BASIC METABOLIC PANEL
Anion gap: 11 (ref 5–15)
BUN: 30 mg/dL — ABNORMAL HIGH (ref 8–23)
CO2: 24 mmol/L (ref 22–32)
Calcium: 9.5 mg/dL (ref 8.9–10.3)
Chloride: 104 mmol/L (ref 98–111)
Creatinine, Ser: 1.53 mg/dL — ABNORMAL HIGH (ref 0.61–1.24)
GFR calc Af Amer: 47 mL/min — ABNORMAL LOW (ref >60)
GFR calc non Af Amer: 41 mL/min — ABNORMAL LOW (ref >60)
Glucose, Bld: 102 mg/dL — ABNORMAL HIGH (ref 70–99)
POTASSIUM: 5.1 mmol/L (ref 3.5–5.1)
Sodium: 139 mmol/L (ref 135–145)

## 2018-11-08 LAB — HEMOGLOBIN A1C
Hgb A1c MFr Bld: 5.3 % (ref 4.8–5.6)
Mean Plasma Glucose: 105.41 mg/dL

## 2018-11-08 NOTE — Pre-Procedure Instructions (Signed)
CBC and BMP sent to Dr. Jeffie Pollock via epic.

## 2018-11-09 NOTE — Anesthesia Preprocedure Evaluation (Addendum)
Anesthesia Evaluation    Reviewed: Allergy & Precautions, Patient's Chart, lab work & pertinent test results  History of Anesthesia Complications Negative for: history of anesthetic complications  Airway Mallampati: III  TM Distance: >3 FB Neck ROM: Full    Dental  (+) Dental Advisory Given, Teeth Intact   Pulmonary sleep apnea and Continuous Positive Airway Pressure Ventilation , COPD,  COPD inhaler, former smoker, PE   breath sounds clear to auscultation       Cardiovascular hypertension, (-) angina+ CAD, + Peripheral Vascular Disease, +CHF and + DVT (s/p IVC filter)  + dysrhythmias + Valvular Problems/Murmurs MR  Rhythm:Regular Rate:Normal   '19 TTE - Mild concentric LVH. EF 45% to 50%. Grade 1 diastolic dysfunction. Mild AI. Mild-mod MR. LA was moderately dilated. PASP was mildly elevated, 45 mmHg. A trivial pericardial effusion was identified.    Neuro/Psych PSYCHIATRIC DISORDERS Depression negative neurological ROS     GI/Hepatic negative GI ROS, Neg liver ROS, neg GERD  ,  Endo/Other  diabetes, Well Controlled, Type 2  Renal/GU  Kidney stones      Musculoskeletal  (+) Arthritis ,   Abdominal   Peds  Hematology negative hematology ROS (+)   Anesthesia Other Findings   Reproductive/Obstetrics                            Anesthesia Physical Anesthesia Plan  ASA: III  Anesthesia Plan: General   Post-op Pain Management:    Induction: Intravenous  PONV Risk Score and Plan: 3 and Treatment may vary due to age or medical condition and Ondansetron  Airway Management Planned: LMA  Additional Equipment: None  Intra-op Plan:   Post-operative Plan: Extubation in OR  Informed Consent: I have reviewed the patients History and Physical, chart, labs and discussed the procedure including the risks, benefits and alternatives for the proposed anesthesia with the patient or authorized  representative who has indicated his/her understanding and acceptance.   Dental advisory given  Plan Discussed with: CRNA and Anesthesiologist  Anesthesia Plan Comments:        Anesthesia Quick Evaluation

## 2018-11-09 NOTE — H&P (Signed)
CC/HPI: Christopher Santiago presents today for pre-operative history and physical exam in anticipation of cystoscopy, urethral dilation, and TURP by Dr. Jeffie Pollock on 11/10/18. He is doing well and without complaint.   Christopher Santiago denies F/C, HA, CP, SOB, N/V, diarrhea/constipation, back pain, flank pain, hematuria, and dysuria. He does have urinary urgency, UUI, and frequency which are very bothersome.    HX:     CC/HPI: I have symptoms of an enlarged prostate.     09/16/18: Mr. Jeff returns today in f/u. He has a history of BPH with BOO with a remote Indigo Laser procedure, meatal stenosis and UTI's. He remains on tamsulosin and is currently on TMP for suppression. He was dilated at his last visit and has a straighter stream but is still having some spraying. He has no dysuria or hematuria. He has some urgency and can have urgency. He has nocturia up to 5 x and frequency 3-5x daily. He feels like he is emptying. ecessarily feel he empties well. He remains on Flomax.   He had a hip replacement in May 2019 and had retention requiring a foley. The foley was removed in early June.    ALLERGIES: Wellbutrin TABS     Notes: Christopher Santiago does not remember if he is allergic to the medication or not   MEDICATIONS: Aspirin Ec 81 mg tablet, delayed release tablet PO Daily  Allopurinol 100 mg tablet 1 tablet PO Daily  Celexa 1 PO Daily  Citalopram Hbr 40 mg tablet  Ipratropium Bromide 21 mcg (0.03 %) aerosol, spray 1 PO Daily  Simvastatin 20 mg tablet 1 tablet PO Daily  Tamsulosin Hcl 0.4 mg capsule 1 capsule PO Daily  Trimethoprim 100 mg tablet 1 tablet PO Q HS start after completing SMZ/TMP script.  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   omental resection? per Christopher Santiago it was 60 years ago   NON-GU PSH: Hemorrhoidectomy (favorite) - 2010 Hip Replacement, Right    GU PMH: BPH w/LUTS, He has persistent LUTS with OAB but is emptying ok and has a clear urine on TMP. I discussed options and I think he would be a reasonable candidate for a urethral dilation and TURP. I don't have much else to offer and his symptoms are causing much bother. I reviewd the risks of a TURP including bleeding, infection, incontinence, stricture, need for secondary procedures, ejaculatory and erectile dysfunction, thrombotic events, fluid overload and anesthetic complications. I explained that 95% of men will have relief of the obstructive symptoms and about 70% will have relief of the irritative symptoms. - 09/16/2018, I have refilled the tamsulosin. If his problem continues, I can consider a TURP. , - 08/08/2018, - 06/30/2018, He is emptying well but has persistent severe LUTS with UUI and a probably UTI. I am going to culture the urine and started antibiotic. He will return in 1-2 weeks for a flowrate and cystoscopy. , - 06/01/2018, Benign prostatic hyperplasia with urinary obstruction, - 2016 Meatal stenosis (other urethral stricture), He had a slight recurrence of the stricture. He will do a  flowrate on return. - 08/08/2018 Acute Cystitis/UTI - 06/01/2018 Dysuria - 04/29/2018 Urinary Retention - 04/29/2018, (Improving), - 04/27/2018, - 04/19/2018 Urge incontinence - 2017, Urge incontinence of urine, - 2016 ED due to arterial insufficiency, Erectile dysfunction due to arterial insufficiency - 2016 Nocturia, Nocturia - 2016 Primary hypogonadism, Hypogonadism, testicular - 2016 Urinary Frequency, Increased urinary frequency - 2014 Urinary Urgency, Urinary urgency - 2014      PMH Notes:   2009-05-31 13:44:12 - Note: Thrombophlebitis Of Deep Vessels Of The Lower Extremity  2009-05-31 13:44:12 - Note: Arthritis   NON-GU PMH: Encounter for general adult medical examination without abnormal findings, Encounter for preventive health examination - 2016 COPD, Chronic Obstructive Pulmonary Disease - 2014 Other injury of unspecified body region, Intramuscular hematoma - 2014 Personal history of other diseases of the circulatory system, History of hypertension - 2014 Personal history of other diseases of the nervous system and sense organs, History of sleep apnea - 2014 Personal history of other endocrine, nutritional and metabolic disease, History of hypercholesterolemia - 2014, History of hypothyroidism, - 2014, History of diabetes mellitus, - 2014 Personal history of other mental and behavioral disorders, History of depression - 2014 Arthritis Depression Diabetes Type 2 DVT, History Gout Pulmonary Embolism, History Sleep Apnea    FAMILY HISTORY: Heart problem - Mother   SOCIAL HISTORY: Marital Status: Married Preferred Language: English; Race: White Current Smoking Status: Patient does not smoke anymore.  Does not use smokeless tobacco. Drinks 3 drinks per day.  Does not use drugs. Drinks 3 caffeinated drinks per day. Has had a blood transfusion.     Notes: 3 sons, 1 daughter  Beer 3-4 per day  blood transfusion    REVIEW OF SYSTEMS:    GU Review Male:   Patient reports frequent urination, hard to postpone urination, get up at night to urinate, and leakage of urine. Patient denies burning/ pain with urination, stream starts and stops, trouble starting your stream, have to strain to urinate , erection problems, and penile pain.  Gastrointestinal (Upper):   Patient denies nausea, vomiting, and indigestion/ heartburn.  Gastrointestinal (Lower):   Patient reports constipation. Patient denies diarrhea.  Constitutional:   Patient denies fever, night sweats, weight loss, and  fatigue.  Skin:   Patient denies skin rash/ lesion and itching.  Eyes:   Patient reports blurred vision. Patient denies double vision.  Ears/ Nose/ Throat:   Patient denies sore throat and sinus problems.  Hematologic/Lymphatic:   Patient denies swollen glands and easy bruising.  Cardiovascular:   Patient denies leg swelling and chest pains.  Respiratory:   Patient reports cough and shortness of breath.   Endocrine:   Patient denies excessive thirst.  Musculoskeletal:   Patient reports joint pain. Patient denies back pain.  Neurological:   Patient denies headaches and dizziness.  Psychologic:   Patient denies depression and anxiety.   VITAL SIGNS:      11/08/2018 03:25 PM  Weight 203 lb / 92.08 kg  Height 69 in / 175.26 cm  BP 160/73 mmHg  Heart Rate 73 /min  Temperature 98.9 F / 37.1 C  BMI 30.0 kg/m   MULTI-SYSTEM PHYSICAL EXAMINATION:    Constitutional: Well-nourished. No physical deformities. Normally developed. Good grooming.  Neck: Neck symmetrical, not swollen. Normal tracheal position.  Respiratory: Normal breath sounds. No labored breathing, no use of accessory muscles.   Cardiovascular: Regular rate and rhythm. No murmur, no gallop.   Lymphatic: No enlargement of neck, axillae, groin.  Skin: No paleness, no jaundice,  no cyanosis. No lesion, no ulcer, no rash. Multiple areas of healing wounds on his left hand  Neurologic / Psychiatric: Oriented to time, oriented to place, oriented to person. No depression, no anxiety, no agitation.  Gastrointestinal: No mass, no tenderness, no rigidity, non obese abdomen.  Eyes: Normal conjunctivae. Normal eyelids.  Ears, Nose, Mouth, and Throat: Left ear no scars, no lesions, no masses. Right ear no scars, no lesions, no masses. Nose no scars, no lesions, no masses. Normal hearing. Normal lips.  Musculoskeletal: Normal gait and station of head and neck.     PAST DATA REVIEWED:  Source Of History:  Patient  Records Review:   Previous  Patient Records  Urine Test Review:   Urinalysis   11/08/18  Urinalysis  Urine Appearance Clear   Urine Color Yellow   Urine Glucose Neg mg/dL  Urine Bilirubin Neg mg/dL  Urine Ketones Neg mg/dL  Urine Specific Gravity 1.025   Urine Blood Neg ery/uL  Urine pH <=5.0   Urine Protein Trace mg/dL  Urine Urobilinogen 0.2 mg/dL  Urine Nitrites Neg   Urine Leukocyte Esterase Neg leu/uL   PROCEDURES:          Urinalysis Dipstick Dipstick Cont'd  Color: Yellow Bilirubin: Neg mg/dL  Appearance: Clear Ketones: Neg mg/dL  Specific Gravity: 1.025 Blood: Neg ery/uL  pH: <=5.0 Protein: Trace mg/dL  Glucose: Neg mg/dL Urobilinogen: 0.2 mg/dL    Nitrites: Neg    Leukocyte Esterase: Neg leu/uL    ASSESSMENT:      ICD-10 Details  1 GU:   BPH w/LUTS - N40.1    PLAN:            Medications Stop Meds: Hydrocodone-Chlorpheniramne Er 10 mg-8 mg/5 ml suspension, extended release 12 hr 1 ml PO Daily  Discontinue: 11/08/2018  - Reason: The medication cycle was completed.  Metoprolol Tartrate 25 mg tablet 1 tablet PO Daily  Discontinue: 11/08/2018  - Reason: The patient suffered unacceptable side effects.  Ventolin Hfa 90 mcg hfa aerosol with adapter 1 PO Daily  Discontinue: 11/08/2018  - Reason: The medication was ineffective.            Schedule Return Visit/Planned Activity: Keep Scheduled Appointment - Schedule Surgery          Document Letter(s):  Created for Patient: Clinical Summary         Notes:   There are no changes in the patients history or physical exam since last evaluation by Dr. Jeffie Pollock. Christopher Santiago is scheduled to undergo cysto, urethral dilation, and TURP on 11/10/18.    All Christopher Santiago's questions were answered to the best of my ability.          Next Appointment:      Next Appointment: 11/10/2018 09:00 AM    Appointment Type: Surgery     Location: [facility] [phone]    Provider: Irine Seal, M.D.    Reason for Visit: OBS WL CYSTO U DIL TURP

## 2018-11-10 ENCOUNTER — Encounter (HOSPITAL_COMMUNITY)
Admission: RE | Disposition: A | Discharge status: Home / Self Care | Payer: Self-pay | Source: Home / Self Care | Attending: Urology

## 2018-11-10 ENCOUNTER — Ambulatory Visit (HOSPITAL_COMMUNITY): Payer: Medicare Other | Admitting: Anesthesiology

## 2018-11-10 ENCOUNTER — Encounter (HOSPITAL_COMMUNITY): Payer: Self-pay

## 2018-11-10 ENCOUNTER — Other Ambulatory Visit: Payer: Self-pay

## 2018-11-10 ENCOUNTER — Observation Stay (HOSPITAL_COMMUNITY)
Admission: RE | Admit: 2018-11-10 | Discharge: 2018-11-11 | Disposition: A | Discharge status: Home / Self Care | Payer: Medicare Other | Attending: Urology | Admitting: Urology

## 2018-11-10 DIAGNOSIS — I110 Hypertensive heart disease with heart failure: Secondary | ICD-10-CM | POA: Diagnosis not present

## 2018-11-10 DIAGNOSIS — Q540 Hypospadias, balanic: Secondary | ICD-10-CM | POA: Insufficient documentation

## 2018-11-10 DIAGNOSIS — J449 Chronic obstructive pulmonary disease, unspecified: Secondary | ICD-10-CM | POA: Diagnosis not present

## 2018-11-10 DIAGNOSIS — N320 Bladder-neck obstruction: Secondary | ICD-10-CM | POA: Insufficient documentation

## 2018-11-10 DIAGNOSIS — Z79899 Other long term (current) drug therapy: Secondary | ICD-10-CM | POA: Insufficient documentation

## 2018-11-10 DIAGNOSIS — N138 Other obstructive and reflux uropathy: Secondary | ICD-10-CM | POA: Diagnosis present

## 2018-11-10 DIAGNOSIS — I509 Heart failure, unspecified: Secondary | ICD-10-CM | POA: Insufficient documentation

## 2018-11-10 DIAGNOSIS — E782 Mixed hyperlipidemia: Secondary | ICD-10-CM | POA: Diagnosis not present

## 2018-11-10 DIAGNOSIS — G4730 Sleep apnea, unspecified: Secondary | ICD-10-CM | POA: Insufficient documentation

## 2018-11-10 DIAGNOSIS — Z87891 Personal history of nicotine dependence: Secondary | ICD-10-CM | POA: Insufficient documentation

## 2018-11-10 DIAGNOSIS — Z87442 Personal history of urinary calculi: Secondary | ICD-10-CM | POA: Insufficient documentation

## 2018-11-10 DIAGNOSIS — F329 Major depressive disorder, single episode, unspecified: Secondary | ICD-10-CM | POA: Insufficient documentation

## 2018-11-10 DIAGNOSIS — I25119 Atherosclerotic heart disease of native coronary artery with unspecified angina pectoris: Secondary | ICD-10-CM | POA: Insufficient documentation

## 2018-11-10 DIAGNOSIS — Z7982 Long term (current) use of aspirin: Secondary | ICD-10-CM | POA: Diagnosis not present

## 2018-11-10 DIAGNOSIS — N400 Enlarged prostate without lower urinary tract symptoms: Secondary | ICD-10-CM | POA: Diagnosis not present

## 2018-11-10 DIAGNOSIS — N35919 Unspecified urethral stricture, male, unspecified site: Secondary | ICD-10-CM | POA: Diagnosis not present

## 2018-11-10 DIAGNOSIS — M1990 Unspecified osteoarthritis, unspecified site: Secondary | ICD-10-CM | POA: Insufficient documentation

## 2018-11-10 DIAGNOSIS — I739 Peripheral vascular disease, unspecified: Secondary | ICD-10-CM | POA: Insufficient documentation

## 2018-11-10 DIAGNOSIS — N401 Enlarged prostate with lower urinary tract symptoms: Secondary | ICD-10-CM | POA: Insufficient documentation

## 2018-11-10 DIAGNOSIS — Z86718 Personal history of other venous thrombosis and embolism: Secondary | ICD-10-CM | POA: Insufficient documentation

## 2018-11-10 HISTORY — PX: CYSTOSCOPY WITH URETHRAL DILATATION: SHX5125

## 2018-11-10 HISTORY — PX: TRANSURETHRAL RESECTION OF PROSTATE: SHX73

## 2018-11-10 LAB — GLUCOSE, CAPILLARY
Glucose-Capillary: 106 mg/dL — ABNORMAL HIGH (ref 70–99)
Glucose-Capillary: 148 mg/dL — ABNORMAL HIGH (ref 70–99)

## 2018-11-10 SURGERY — TURP (TRANSURETHRAL RESECTION OF PROSTATE)
Anesthesia: General

## 2018-11-10 MED ORDER — CIPROFLOXACIN IN D5W 400 MG/200ML IV SOLN
400.0000 mg | INTRAVENOUS | Status: AC
  Administered 2018-11-10: 400 mg via INTRAVENOUS

## 2018-11-10 MED ORDER — POTASSIUM CHLORIDE IN NACL 20-0.45 MEQ/L-% IV SOLN
INTRAVENOUS | Status: DC
  Administered 2018-11-10: 15:00:00 via INTRAVENOUS
  Filled 2018-11-10 (×2): qty 1000

## 2018-11-10 MED ORDER — FENTANYL CITRATE (PF) 100 MCG/2ML IJ SOLN: 25.0000 ug | INTRAMUSCULAR | Status: DC | PRN

## 2018-11-10 MED ORDER — SIMVASTATIN 20 MG PO TABS
20.0000 mg | ORAL_TABLET | Freq: Every day | ORAL | Status: DC
  Administered 2018-11-10: 20 mg via ORAL
  Filled 2018-11-10: qty 1

## 2018-11-10 MED ORDER — FENTANYL CITRATE (PF) 100 MCG/2ML IJ SOLN
INTRAMUSCULAR | Status: DC | PRN
  Administered 2018-11-10 (×4): 25 ug via INTRAVENOUS

## 2018-11-10 MED ORDER — ALBUTEROL SULFATE (2.5 MG/3ML) 0.083% IN NEBU: 2.5000 mg | INHALATION_SOLUTION | Freq: Four times a day (QID) | RESPIRATORY_TRACT | Status: DC | PRN

## 2018-11-10 MED ORDER — PROPOFOL 10 MG/ML IV BOLUS
INTRAVENOUS | Status: DC | PRN
  Administered 2018-11-10: 110 mg via INTRAVENOUS

## 2018-11-10 MED ORDER — OXYCODONE HCL 5 MG/5ML PO SOLN: 5.0000 mg | Freq: Once | ORAL | Status: DC | PRN

## 2018-11-10 MED ORDER — ALLOPURINOL 100 MG PO TABS: 100.0000 mg | ORAL_TABLET | Freq: Every day | ORAL | Status: DC

## 2018-11-10 MED ORDER — FLEET ENEMA 7-19 GM/118ML RE ENEM: 1.0000 | ENEMA | Freq: Once | RECTAL | Status: DC | PRN

## 2018-11-10 MED ORDER — CIPROFLOXACIN IN D5W 400 MG/200ML IV SOLN
INTRAVENOUS | Status: AC
  Filled 2018-11-10: qty 200

## 2018-11-10 MED ORDER — ONDANSETRON HCL 4 MG/2ML IJ SOLN: 4.0000 mg | INTRAMUSCULAR | Status: DC | PRN

## 2018-11-10 MED ORDER — SODIUM CHLORIDE 0.9 % IR SOLN
3000.0000 mL | Status: DC
  Administered 2018-11-10: 3000 mL

## 2018-11-10 MED ORDER — LIDOCAINE 2% (20 MG/ML) 5 ML SYRINGE
INTRAMUSCULAR | Status: DC | PRN
  Administered 2018-11-10: 60 mg via INTRAVENOUS

## 2018-11-10 MED ORDER — LIDOCAINE 2% (20 MG/ML) 5 ML SYRINGE
INTRAMUSCULAR | Status: AC
  Filled 2018-11-10: qty 5

## 2018-11-10 MED ORDER — LACTATED RINGERS IV SOLN
INTRAVENOUS | Status: DC
  Administered 2018-11-10: 12:00:00 via INTRAVENOUS
  Administered 2018-11-10: 1000 mL via INTRAVENOUS

## 2018-11-10 MED ORDER — ONDANSETRON HCL 4 MG/2ML IJ SOLN
INTRAMUSCULAR | Status: DC | PRN
  Administered 2018-11-10: 4 mg via INTRAVENOUS

## 2018-11-10 MED ORDER — DEXAMETHASONE SODIUM PHOSPHATE 10 MG/ML IJ SOLN
INTRAMUSCULAR | Status: AC
  Filled 2018-11-10: qty 1

## 2018-11-10 MED ORDER — SODIUM CHLORIDE 0.9 % IR SOLN
Status: DC | PRN
  Administered 2018-11-10: 12000 mL via INTRAVESICAL

## 2018-11-10 MED ORDER — BISACODYL 10 MG RE SUPP: 10.0000 mg | Freq: Every day | RECTAL | Status: DC | PRN

## 2018-11-10 MED ORDER — PROPOFOL 10 MG/ML IV BOLUS
INTRAVENOUS | Status: AC
  Filled 2018-11-10: qty 20

## 2018-11-10 MED ORDER — OXYCODONE HCL 5 MG PO TABS: 5.0000 mg | ORAL_TABLET | Freq: Once | ORAL | Status: DC | PRN

## 2018-11-10 MED ORDER — HYDROMORPHONE HCL 1 MG/ML IJ SOLN: 0.5000 mg | INTRAMUSCULAR | Status: DC | PRN

## 2018-11-10 MED ORDER — CITALOPRAM HYDROBROMIDE 40 MG PO TABS
60.0000 mg | ORAL_TABLET | Freq: Every day | ORAL | Status: DC
  Filled 2018-11-10 (×2): qty 1

## 2018-11-10 MED ORDER — DEXAMETHASONE SODIUM PHOSPHATE 10 MG/ML IJ SOLN
INTRAMUSCULAR | Status: DC | PRN
  Administered 2018-11-10: 10 mg via INTRAVENOUS

## 2018-11-10 MED ORDER — VITAMIN D3 25 MCG (1000 UNIT) PO TABS: 2000.0000 [IU] | ORAL_TABLET | Freq: Every day | ORAL | Status: DC

## 2018-11-10 MED ORDER — FENTANYL CITRATE (PF) 100 MCG/2ML IJ SOLN
INTRAMUSCULAR | Status: AC
  Filled 2018-11-10: qty 2

## 2018-11-10 MED ORDER — ONDANSETRON HCL 4 MG/2ML IJ SOLN
INTRAMUSCULAR | Status: AC
  Filled 2018-11-10: qty 2

## 2018-11-10 MED ORDER — POLYETHYLENE GLYCOL 3350 17 G PO PACK: 17.0000 g | PACK | Freq: Every day | ORAL | Status: DC | PRN

## 2018-11-10 MED ORDER — ONDANSETRON HCL 4 MG/2ML IJ SOLN: 4.0000 mg | Freq: Once | INTRAMUSCULAR | Status: DC | PRN

## 2018-11-10 MED ORDER — ACETAMINOPHEN 325 MG PO TABS: 650.0000 mg | ORAL_TABLET | ORAL | Status: DC | PRN

## 2018-11-10 MED ORDER — HYDROCODONE-ACETAMINOPHEN 5-325 MG PO TABS: 1.0000 | ORAL_TABLET | ORAL | Status: DC | PRN

## 2018-11-10 SURGICAL SUPPLY — 23 items
BAG URINE DRAINAGE (UROLOGICAL SUPPLIES) ×2 IMPLANT
BAG URO CATCHER STRL LF (MISCELLANEOUS) ×3 IMPLANT
BALLN NEPHROSTOMY (BALLOONS)
BALLOON NEPHROSTOMY (BALLOONS) IMPLANT
CATH FOLEY 3WAY 30CC 22FR (CATHETERS) ×2 IMPLANT
CATH ROBINSON RED A/P 16FR (CATHETERS) IMPLANT
CATH URET 5FR 28IN OPEN ENDED (CATHETERS) IMPLANT
CLOTH BEACON ORANGE TIMEOUT ST (SAFETY) ×3 IMPLANT
COVER WAND RF STERILE (DRAPES) IMPLANT
ELECT REM PT RETURN 15FT ADLT (MISCELLANEOUS) ×1 IMPLANT
GLOVE SURG SS PI 8.0 STRL IVOR (GLOVE) ×8 IMPLANT
GOWN STRL REUS W/TWL XL LVL3 (GOWN DISPOSABLE) ×5 IMPLANT
GUIDEWIRE STR DUAL SENSOR (WIRE) ×1 IMPLANT
HOLDER FOLEY CATH W/STRAP (MISCELLANEOUS) ×2 IMPLANT
LOOP CUT BIPOLAR 24F LRG (ELECTROSURGICAL) ×2 IMPLANT
MANIFOLD NEPTUNE II (INSTRUMENTS) ×3 IMPLANT
PACK CYSTO (CUSTOM PROCEDURE TRAY) ×3 IMPLANT
SET ASPIRATION TUBING (TUBING) ×1 IMPLANT
SYR 30ML LL (SYRINGE) ×2 IMPLANT
SYRINGE IRR TOOMEY STRL 70CC (SYRINGE) ×2 IMPLANT
TUBING CONNECTING 10 (TUBING) ×2 IMPLANT
TUBING CONNECTING 10' (TUBING) ×1
TUBING UROLOGY SET (TUBING) ×3 IMPLANT

## 2018-11-10 NOTE — Anesthesia Procedure Notes (Signed)
Procedure Name: LMA Insertion Date/Time: 11/10/2018 9:11 AM Performed by: Audry Pili, MD Patient Re-evaluated:Patient Re-evaluated prior to induction Oxygen Delivery Method: Circle system utilized Preoxygenation: Pre-oxygenation with 100% oxygen Induction Type: IV induction Ventilation: Mask ventilation without difficulty LMA: LMA inserted LMA Size: 4.0 Number of attempts: 1 Placement Confirmation: positive ETCO2 and breath sounds checked- equal and bilateral Tube secured with: Tape Dental Injury: Teeth and Oropharynx as per pre-operative assessment

## 2018-11-10 NOTE — Interval H&P Note (Signed)
History and Physical Interval Note:  11/10/2018 8:54 AM  Christopher Santiago  has presented today for surgery, with the diagnosis of [place]  The various methods of treatment have been discussed with the patient and family. After consideration of risks, benefits and other options for treatment, the patient has consented to  Procedure(s): TRANSURETHRAL RESECTION OF THE PROSTATE (TURP) (N/A) CYSTOSCOPY WITH URETHRAL DILATATION (N/A) as a surgical intervention .  The patient's history has been reviewed, patient examined, no change in status, stable for surgery.  I have reviewed the patient's chart and labs.  Questions were answered to the patient's satisfaction.     Irine Seal

## 2018-11-10 NOTE — Op Note (Signed)
Procedure: Cystoscopy with urethral dilation and transurethral resection of the prostate.  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  Postoperative diagnosis: Same.  Surgeon: Dr. Irine Seal.  Anesthesia: General.  Specimen: Prostate chips.  Drains: 22 French three-way Foley catheter.  EBL: Minimal.  Complications: None.  Indications: Monica Martinez is an 84 year old white male with a history of BPH and bladder outlet obstruction who is to undergo TURP.  He has coronal hypospadias and a known mild distal urethral stricture.  Procedure: He was taken the operating room where he was given 4 mg of Cipro.  A general anesthetic was induced.  He was placed in lithotomy position and fitted with PAS hose.  His perineum and genitalia were prepped with Betadine solution and was draped in usual sterile fashion.  Initial attempted cystoscopy with a 26 French scope was not successful because of the distal stricture.  He was then dilated from 50 [place] to 28 [place] with [employer].  The 23 French cystoscope was then passed and the remainder of the urethra was unremarkable.  The external sphincter was intact.  The prostatic urethra was approximately 2 to 3 cm in length with bilobar hyperplasia with coaptation and obstruction.  Inspection of the bladder revealed severe trabeculation with multiple cellules and small diverticuli particular on the posterior wall.  Ureteral orifice ease were unremarkable.  No tumors or stones were identified.  The 70 French continuous-flow resectoscope sheath was then passed with the aid of a visual obturator.  The scope was then fitted with an Beatrix Fetters handle with a bipolar loop and 30 degree lens.  Saline was used as the irrigant.  The prostate was then resected beginning at the bladder neck where the fibers were exposed from 5-7 o'clock.  The floor the prostate was resected out to alongside the verumontanum.  The left lobe of the prostate  was resected from bladder neck to apex, out to the capsular fibers.  The right lobe of the prostate was then resected from bladder neck to apex, out to the capsular fibers.  Some residual apical and anterior tissue was then resected as well.  Hemostasis was achieved and the bladder was evacuated free of chips.  Inspection revealed no active bleeding, no obvious retained chips and ureteral orifice ease were unremarkable.  The resectoscope was removed and pressure on the bladder produced an excellent stream.  A 22 French three-way Foley catheter was inserted with the aid of a catheter guide and the balloon was filled with 30 cc of sterile fluid.  The cath was irrigated and fluid would go in but not easily come out.  A Toomey syringe was then used and a couple of chips were withdrawn.  Because there were residual chips in the bladder, I removed the Foley and replaced the resectoscope.  Multiple residual chips were identified and it was felt that these may have been hidden in a diverticulum.  The residual chips were removed and a very thorough inspection of all of the small diverticuli was performed and no residual tissue was noted.  The resectoscope was removed and the Foley catheter was replaced.  The balloon was filled with 30 cc of sterile fluid.  The catheter was irrigated with a Toomey syringe with quantitative return.  The catheter was placed to straight drainage and continuous irrigation with normal saline.  He was taken down from lithotomy position, his anesthetic was reversed and he was moved to recovery in stable condition.  There were no complications.

## 2018-11-10 NOTE — Discharge Instructions (Signed)
Transurethral Resection of the Prostate, Care After °Refer to this sheet in the next few weeks. These instructions provide you with information about caring for yourself after your procedure. Your health care provider may also give you more specific instructions. Your treatment has been planned according to current medical practices, but problems sometimes occur. Call your health care provider if you have any problems or questions after your procedure. °What can I expect after the procedure? °After the procedure, it is common to have: °· Mild pain in your lower abdomen. °· Soreness or mild discomfort in your penis from having the catheter inserted during the procedure. °· A feeling of urgency when you need to urinate. °· A small amount of blood in your urine. You may notice some small blood clots in your urine. These are normal. °Follow these instructions at home: °Medicines ° °· Take over-the-counter and prescription medicines only as told by your health care provider. °· Do not drive or operate heavy machinery while taking prescription pain medicine. °· Do not drive for 24 hours if you received a sedative. °· If you were prescribed antibiotic medicine, take it as told by your health care provider. Do not stop taking the antibiotic even if you start to feel better. °Activity °· Return to your normal activities as told by your health care provider. Ask your health care provider what activities are safe for you. °· Do not lift anything that is heavier than 10 lb (4.5 kg) for 3 weeks after your procedure, or as long as told by your health care provider. °· Avoid intense physical activity for as long as told by your health care provider. °· Avoid sitting for a long time without moving. Get up and move around one or more times every few hours. This helps to prevent blood clots. You may increase your physical activity gradually as you start to feel better. °Lifestyle °· Do not drink alcohol for as long as told by your  health care provider. This is especially important if you are taking prescription pain medicines. °· Do not engage in sexual activity until your health care provider says that you can do this. °General instructions °· Do not take baths, swim, or use a hot tub until your health care provider approves. °· Drink enough fluid to keep your urine clear or pale yellow. °· Urinate as soon as you feel the need to. Do not try to hold your urine for long periods of time. °· If your health care provider approves, you may take a stool softener for 2-3 weeks to prevent you from straining to have a bowel movement. °· Wear compression stockings as told by your health care provider. These stockings help to prevent blood clots and reduce swelling in your legs. °· Keep all follow-up visits as told by your health care provider. This is important. °Contact a health care provider if: °· You have difficulty urinating. °· You have a fever. °· You have pain that gets worse or does not improve with medicine. °· You have blood in your urine that does not go away after 1 week of resting and drinking more fluids. °· You have swelling in your penis or testicles. °Get help right away if: °· You are unable to urinate. °· You are having more blood clots in your urine instead of fewer. °· You have: °? Large blood clots. °? A lot of blood in your urine. °? Pain in your back or lower abdomen. °? Pain or swelling in your legs. °?   Chills and you are shaking. °This information is not intended to replace advice given to you by your health care provider. Make sure you discuss any questions you have with your health care provider. °Document Released: 11/09/2005 Document Revised: 12/23/2016 Document Reviewed: 08/01/2015 °Elsevier Interactive Patient Education © 2019 [employer] ° °

## 2018-11-10 NOTE — Transfer of Care (Signed)
Immediate Anesthesia Transfer of Care Note  Patient: Christopher Santiago  Procedure(s) Performed: TRANSURETHRAL RESECTION OF THE PROSTATE (TURP) (N/A ) CYSTOSCOPY WITH URETHRAL DILATATION (N/A )  Patient Location: PACU  Anesthesia Type:General  Level of Consciousness: awake, alert  and oriented  Airway & Oxygen Therapy: Patient Spontanous Breathing and Patient connected to face mask oxygen  Post-op Assessment: Report given to RN and Post -op Vital signs reviewed and stable  Post vital signs: Reviewed and stable  Last Vitals:  Vitals Value Taken Time  BP    Temp    Pulse 69 11/10/2018 10:16 AM  Resp 22 11/10/2018 10:16 AM  SpO2 100 % 11/10/2018 10:16 AM  Vitals shown include unvalidated device data.  Last Pain:  Vitals:   11/10/18 0722  TempSrc:   PainSc: 0-No pain      Patients Stated Pain Goal: 4 (43/60/67 [id number])  Complications: No apparent anesthesia complications

## 2018-11-10 NOTE — Anesthesia Postprocedure Evaluation (Signed)
Anesthesia Post Note  Patient: Christopher Santiago  Procedure(s) Performed: TRANSURETHRAL RESECTION OF THE PROSTATE (TURP) (N/A ) CYSTOSCOPY WITH URETHRAL DILATATION (N/A )     Patient location during evaluation: PACU Anesthesia Type: General Level of consciousness: awake and alert Pain management: pain level controlled Vital Signs Assessment: post-procedure vital signs reviewed and stable Respiratory status: spontaneous breathing, nonlabored ventilation and respiratory function stable Cardiovascular status: blood pressure returned to baseline and stable Postop Assessment: no apparent nausea or vomiting Anesthetic complications: no    Last Vitals:  Vitals:   11/10/18 1045 11/10/18 1145  BP: (!) 165/70   Pulse: 61 63  Resp: 14   Temp:  36.7 C  SpO2: 100% 97%    Last Pain:  Vitals:   11/10/18 1145  TempSrc:   PainSc: 0-No pain                 Audry Pili

## 2018-11-10 NOTE — Progress Notes (Signed)
Pt did not want to use CPAP tonight. Pt states he is only here for one night and does not want to use a CPAP tonight. RT will continue to monitor.

## 2018-11-11 ENCOUNTER — Encounter (HOSPITAL_COMMUNITY): Payer: Self-pay | Admitting: Urology

## 2018-11-11 NOTE — Progress Notes (Signed)
Writer discontinued foley and CBI per provider order. Placed string of bottles at bedside to collect first three voids and educated patient about the process. Will continue to monitor patient.

## 2018-11-11 NOTE — Plan of Care (Signed)
  Problem: Education: Goal: Knowledge of General Education information will improve Description Including pain rating scale, medication(s)/side effects and non-pharmacologic comfort measures Outcome: Completed/Met   Problem: Health Behavior/Discharge Planning: Goal: Ability to manage health-related needs will improve Outcome: Completed/Met   Problem: Clinical Measurements: Goal: Ability to maintain clinical measurements within normal limits will improve Outcome: Completed/Met Goal: Will remain free from infection Outcome: Completed/Met Goal: Diagnostic test results will improve Outcome: Completed/Met Goal: Respiratory complications will improve Outcome: Completed/Met Goal: Cardiovascular complication will be avoided Outcome: Completed/Met   Problem: Clinical Measurements: Goal: Will remain free from infection Outcome: Completed/Met   Problem: Clinical Measurements: Goal: Diagnostic test results will improve Outcome: Completed/Met   Problem: Clinical Measurements: Goal: Respiratory complications will improve Outcome: Completed/Met   Problem: Clinical Measurements: Goal: Cardiovascular complication will be avoided Outcome: Completed/Met

## 2018-11-11 NOTE — Discharge Summary (Signed)
Patient ID: Christopher Santiago MRN: [id number] DOB/AGE: 1931/08/09 84 y.o.  Admit date: 11/10/2018 Discharge date: 11/11/2018  Primary Care Physician:  Tonia Ghent, MD  Discharge Diagnoses:  N40.1 Present on Admission: . BPH with urinary obstruction   Consults:  None   Discharge Medications: Allergies as of 11/11/2018      Reactions   Tessalon [benzonatate] Other (See Comments)   Ineffective, nasal congestion      Medication List    TAKE these medications   albuterol 108 (90 Base) MCG/ACT inhaler Commonly known as:  VENTOLIN HFA Inhale 1-2 puffs into the lungs every 6 (six) hours as needed for wheezing or shortness of breath. What changed:  how much to take   allopurinol 100 MG tablet Commonly known as:  ZYLOPRIM TAKE 1 TABLET BY MOUTH ONCE DAILY   aspirin 81 MG EC tablet Take 81 mg by mouth daily.   Cholecalciferol 50 MCG (2000 UT) Caps Take 2,000 Units by mouth daily.   citalopram 40 MG tablet Commonly known as:  CELEXA TAKE 1 AND 1/2 TABLETS BY MOUTH DAILY   indomethacin 50 MG capsule Commonly known as:  INDOCIN TAKE 1 CAPSULE BY MOUTH 2 TIMES DAILY AS NEEDED FOR PAIN WITH FOOD What changed:    how much to take  how to take this  when to take this  reasons to take this  additional instructions   simvastatin 20 MG tablet Commonly known as:  ZOCOR TAKE 1 TABLET BY MOUTH EVERY NIGHT AT BEDTIME        Significant Diagnostic Studies:  No results found.  Brief H and P: For complete details please refer to admission H and P, but in brief pt admitted for TURP for mgmt of symptomatic BPH  Hospital Course: TURP on day of admission. Postoperative course was w/o issues. He voided well following catheter removal. Active Problems:   BPH with urinary obstruction   Day of Discharge BP (!) 179/73 (BP Location: Right Arm)   Pulse 65   Temp (!) 97.5 F (36.4 C) (Oral)   Resp 20   Ht 5\' 9"  (1.753 m)   Wt 90.3 kg   SpO2 96%   BMI 29.39 kg/m    Results for orders placed or performed during the hospital encounter of 11/10/18 (from the past 24 hour(s))  Glucose, capillary     Status: Abnormal   Collection Time: 11/10/18  7:49 AM  Result Value Ref Range   Glucose-Capillary 106 (H) 70 - 99 mg/dL  Glucose, capillary     Status: Abnormal   Collection Time: 11/10/18 10:34 AM  Result Value Ref Range   Glucose-Capillary 148 (H) 70 - 99 mg/dL    Physical Exam: General: Alert and awake oriented x3 not in any acute distress. HEENT: anicteric sclera, pupils reactive to light and accommodation CVS: S1-S2 clear no murmur rubs or gallops Chest: clear to auscultation bilaterally, no wheezing rales or rhonchi Abdomen: soft nontender, nondistended, normal bowel sounds, no organomegaly Extremities: no cyanosis, clubbing or edema noted bilaterally Neuro: Cranial nerves II-XII intact, no focal neurological deficits  Disposition:  Home  Diet:  Regular  Activity:  Gradually increase    TESTS THAT NEED FOLLOW-UP   Path review  DISCHARGE FOLLOW-UP  Follow-up Information    Jed Limerick, NP On 11/24/2018.   Specialty:  Urology Why:  10:45 Contact information: [address] [place] [address] [phone]           Time spent on Discharge:  15 mins  Signed: Lillette Boxer Liam Cammarata 11/11/2018, 7:48 AM

## 2018-11-24 DIAGNOSIS — R350 Frequency of micturition: Secondary | ICD-10-CM | POA: Diagnosis not present

## 2018-11-24 DIAGNOSIS — N401 Enlarged prostate with lower urinary tract symptoms: Secondary | ICD-10-CM | POA: Diagnosis not present

## 2018-11-24 DIAGNOSIS — R3915 Urgency of urination: Secondary | ICD-10-CM | POA: Diagnosis not present

## 2018-11-26 ENCOUNTER — Other Ambulatory Visit: Payer: Self-pay | Admitting: Family Medicine

## 2018-11-28 NOTE — Telephone Encounter (Signed)
Electronic refill request. [facility] Last office visit:   10/03/18 Last Filled:    18 g 5 08/10/2017  Please advise.

## 2018-11-29 NOTE — Telephone Encounter (Signed)
Sent. Thanks.   

## 2018-12-30 DIAGNOSIS — N3000 Acute cystitis without hematuria: Secondary | ICD-10-CM | POA: Diagnosis not present

## 2018-12-30 DIAGNOSIS — N401 Enlarged prostate with lower urinary tract symptoms: Secondary | ICD-10-CM | POA: Diagnosis not present

## 2018-12-30 DIAGNOSIS — R3915 Urgency of urination: Secondary | ICD-10-CM | POA: Diagnosis not present

## 2019-01-17 ENCOUNTER — Other Ambulatory Visit: Payer: Self-pay | Admitting: Family Medicine

## 2019-02-06 DIAGNOSIS — N401 Enlarged prostate with lower urinary tract symptoms: Secondary | ICD-10-CM | POA: Diagnosis not present

## 2019-02-06 DIAGNOSIS — R351 Nocturia: Secondary | ICD-10-CM | POA: Diagnosis not present

## 2019-02-24 ENCOUNTER — Ambulatory Visit: Payer: Medicare Other

## 2019-02-24 ENCOUNTER — Encounter: Payer: Medicare Other | Admitting: Family Medicine

## 2019-03-31 DIAGNOSIS — N3941 Urge incontinence: Secondary | ICD-10-CM | POA: Diagnosis not present

## 2019-03-31 DIAGNOSIS — N401 Enlarged prostate with lower urinary tract symptoms: Secondary | ICD-10-CM | POA: Diagnosis not present

## 2019-03-31 DIAGNOSIS — R351 Nocturia: Secondary | ICD-10-CM | POA: Diagnosis not present

## 2019-04-18 ENCOUNTER — Other Ambulatory Visit: Payer: Self-pay | Admitting: Family Medicine

## 2019-05-04 ENCOUNTER — Telehealth: Payer: Self-pay

## 2019-05-04 NOTE — Telephone Encounter (Signed)
Virtual Visit Pre-Appointment Phone Call  "Christopher Santiago, I am calling you today to discuss your upcoming appointment. We are currently trying to limit exposure to the virus that causes COVID-19 by seeing patients at home rather than in the office."  1. "What is the BEST phone number to call the day of the visit?" - include this in appointment notes  2. "Do you have or have access to (through a family member/friend) a smartphone with video capability that we can use for your visit?" a. If yes - list this number in appt notes as "cell" (if different from BEST phone #) and list the appointment type as a VIDEO visit in appointment notes b. If no - list the appointment type as a PHONE visit in appointment notes  3. Confirm consent - "In the setting of the current Covid19 crisis, you are scheduled for a video visit with your provider on 05/30/2019 at 10:40AM.  Just as we do with many in-office visits, in order for you to participate in this visit, we must obtain consent.  If you'd like, I can send this to your mychart (if signed up) or email for you to review.  Otherwise, I can obtain your verbal consent now.  All virtual visits are billed to your insurance company just like a normal visit would be.  By agreeing to a virtual visit, we'd like you to understand that the technology does not allow for your provider to perform an examination, and thus may limit your provider's ability to fully assess your condition. If your provider identifies any concerns that need to be evaluated in person, we will make arrangements to do so.  Finally, though the technology is pretty good, we cannot assure that it will always work on either your or our end, and in the setting of a video visit, we may have to convert it to a phone-only visit.  In either situation, we cannot ensure that we have a secure connection.  Are you willing to proceed?" STAFF: Did the patient verbally acknowledge consent to telehealth visit? Document YES/NO  here: YES  4. Advise patient to be prepared - "Two hours prior to your appointment, go ahead and check your blood pressure, pulse, oxygen saturation, and your weight (if you have the equipment to check those) and write them all down. When your visit starts, your provider will ask you for this information. If you have an Apple Watch or Kardia device, please plan to have heart rate information ready on the day of your appointment. Please have a pen and paper handy nearby the day of the visit as well."  5. Give patient instructions for MyChart download to smartphone OR [email or web address] as below if video visit (depending on what platform provider is using)  6. Inform patient they will receive a phone call 15 minutes prior to their appointment time (may be from unknown caller ID) so they should be prepared to answer    TELEPHONE CALL NOTE  Christopher Santiago has been deemed a candidate for a follow-up tele-health visit to limit community exposure during the Covid-19 pandemic. I spoke with the patient via phone to ensure availability of phone/video source, confirm preferred email & phone number, and discuss instructions and expectations.  I reminded Christopher Santiago to be prepared with any vital sign and/or heart rhythm information that could potentially be obtained via home monitoring, at the time of his visit. I reminded Christopher Santiago to expect a phone call prior to his visit.  Rene Paci McClain 05/04/2019 3:55 PM   INSTRUCTIONS FOR DOWNLOADING THE MYCHART APP TO SMARTPHONE  - The patient must first make sure to have activated MyChart and know their login information - If Apple, go to [employer] and type in MyChart in the search bar and download the app. If Android, ask patient to go to [employer] and type in [place] in the search bar and download the app. The app is free but as with any other app downloads, their phone may require them to verify saved payment information or Apple/Android  password.  - The patient will need to then log into the app with their MyChart username and password, and select [facility] as their healthcare provider to link the account. When it is time for your visit, go to the MyChart app, find appointments, and click Begin Video Visit. Be sure to Select Allow for your device to access the Microphone and Camera for your visit. You will then be connected, and your provider will be with you shortly.  **If they have any issues connecting, or need assistance please contact MyChart service desk [phone])**  **If using a computer, in order to ensure the best quality for their visit they will need to use either of the following Internet Browsers: [employer], or Google Chrome**  IF USING DOXIMITY or DOXY.ME - The patient will receive a link just prior to their visit by text.     FULL LENGTH CONSENT FOR TELE-HEALTH VISIT   I hereby voluntarily request, consent and authorize [facility] and its employed or contracted physicians, physician assistants, nurse practitioners or other licensed health care professionals (the Practitioner), to provide me with telemedicine health care services (the "Services") as deemed necessary by the treating Practitioner. I acknowledge and consent to receive the Services by the Practitioner via telemedicine. I understand that the telemedicine visit will involve communicating with the Practitioner through live audiovisual communication technology and the disclosure of certain medical information by electronic transmission. I acknowledge that I have been given the opportunity to request an in-person assessment or other available alternative prior to the telemedicine visit and am voluntarily participating in the telemedicine visit.  I understand that I have the right to withhold or withdraw my consent to the use of telemedicine in the course of my care at any time, without affecting my right to future care or treatment,  and that the Practitioner or I may terminate the telemedicine visit at any time. I understand that I have the right to inspect all information obtained and/or recorded in the course of the telemedicine visit and may receive copies of available information for a reasonable fee.  I understand that some of the potential risks of receiving the Services via telemedicine include:  Marland Kitchen Delay or interruption in medical evaluation due to technological equipment failure or disruption; . Information transmitted may not be sufficient (e.g. poor resolution of images) to allow for appropriate medical decision making by the Practitioner; and/or  . In rare instances, security protocols could fail, causing a breach of personal health information.  Furthermore, I acknowledge that it is my responsibility to provide information about my medical history, conditions and care that is complete and accurate to the best of my ability. I acknowledge that Practitioner's advice, recommendations, and/or decision may be based on factors not within their control, such as incomplete or inaccurate data provided by me or distortions of diagnostic images or specimens that may result from electronic transmissions. I understand that  the practice of medicine is not an exact science and that Practitioner makes no warranties or guarantees regarding treatment outcomes. I acknowledge that I will receive a copy of this consent concurrently upon execution via email to the email address I last provided but may also request a printed copy by calling the office of [facility].    I understand that my insurance will be billed for this visit.   I have read or had this consent read to me. . I understand the contents of this consent, which adequately explains the benefits and risks of the Services being provided via telemedicine.  . I have been provided ample opportunity to ask questions regarding this consent and the Services and have had my questions  answered to my satisfaction. . I give my informed consent for the services to be provided through the use of telemedicine in my medical care  By participating in this telemedicine visit I agree to the above.

## 2019-05-08 ENCOUNTER — Other Ambulatory Visit: Payer: Self-pay | Admitting: Cardiovascular Disease
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
## 2019-05-30 ENCOUNTER — Encounter: Payer: Self-pay | Admitting: Cardiovascular Disease

## 2019-05-30 ENCOUNTER — Telehealth (INDEPENDENT_AMBULATORY_CARE_PROVIDER_SITE_OTHER): Payer: Medicare Other | Admitting: Cardiovascular Disease

## 2019-05-30 VITALS — Ht 69.0 in | Wt 200.0 lb

## 2019-05-30 DIAGNOSIS — I77810 Thoracic aortic ectasia: Secondary | ICD-10-CM

## 2019-05-30 DIAGNOSIS — E782 Mixed hyperlipidemia: Secondary | ICD-10-CM | POA: Diagnosis not present

## 2019-05-30 DIAGNOSIS — I739 Peripheral vascular disease, unspecified: Secondary | ICD-10-CM | POA: Diagnosis not present

## 2019-05-30 DIAGNOSIS — R0602 Shortness of breath: Secondary | ICD-10-CM

## 2019-05-30 DIAGNOSIS — I5032 Chronic diastolic (congestive) heart failure: Secondary | ICD-10-CM | POA: Diagnosis not present

## 2019-05-30 DIAGNOSIS — I272 Pulmonary hypertension, unspecified: Secondary | ICD-10-CM | POA: Diagnosis not present

## 2019-05-30 NOTE — Patient Instructions (Signed)
Medication Instructions:  Please take lasix 40 mg daily as needed with potassium 20 meq as needed for shortness of breath  If you need a refill on your cardiac medications before your next appointment, please call your pharmacy.    Lab work: No new labs needed   If you have labs (blood work) drawn today and your tests are completely normal, you will receive your results only by: Marland Kitchen MyChart Message (if you have MyChart) OR . A paper copy in the mail If you have any lab test that is abnormal or we need to change your treatment, we will call you to review the results.   Testing/Procedures: No new testing needed   Follow-Up: At [facility], you and your health needs are our priority.  As part of our continuing mission to provide you with exceptional heart care, we have created designated Provider Care Teams.  These Care Teams include your primary Cardiologist (physician) and Advanced Practice Providers (APPs -  Physician Assistants and Nurse Practitioners) who all work together to provide you with the care you need, when you need it.  . You will need a follow up appointment in 3 months .   Please call our office 2 months in advance to schedule this appointment.    . Providers on your designated Care Team:   . Murray Hodgkins, NP . Christell Faith, PA-C . Marrianne Mood, PA-C  Any Other Special Instructions Will Be Listed Below (If Applicable).  For educational health videos Log in to : [email or web address] Or : [email or web address], password : [email or web address]

## 2019-06-01 ENCOUNTER — Other Ambulatory Visit: Payer: Self-pay | Admitting: Cardiovascular Disease

## 2019-06-01 NOTE — Telephone Encounter (Signed)
Patient states he cardiac medications have not been called to [facility]. Please call to discuss. Patient did not remember the names of his medications.

## 2019-06-02 MED ORDER — FUROSEMIDE 40 MG PO TABS: 40.0000 mg | ORAL_TABLET | Freq: Every day | ORAL | 1 refills | Status: DC | PRN

## 2019-06-02 MED ORDER — POTASSIUM CHLORIDE CRYS ER 20 MEQ PO TBCR: 20.0000 meq | EXTENDED_RELEASE_TABLET | Freq: Every day | ORAL | 1 refills | Status: DC | PRN

## 2019-06-02 MED ORDER — SIMVASTATIN 20 MG PO TABS: 20.0000 mg | ORAL_TABLET | Freq: Every day | ORAL | 2 refills | Status: DC

## 2019-06-02 NOTE — Telephone Encounter (Signed)
Patient was told by [facility] he was starting on a new med   Pt c/o medication issue:  1. Name of Medication: ? unknown   2. How are you currently taking this medication (dosage and times per day)?   3. Are you having a reaction (difficulty breathing--STAT)? No   4. What is your medication issue? Patient was told by Rockey Situ he was starting on a new med  But nothing was sent in please call to discuss

## 2019-06-02 NOTE — Telephone Encounter (Signed)
Returned the pt call. Pt had a telehealth visit on 05/30/19 with Dr.Gollan. Per the documentation Please take lasix 40 mg daily as needed with potassium 20 meq as needed for shortness of breath.  The Rx was not sent in. Apologized to the pt for the inconvenience . Adv the pt that I have just sent the Rx to his pharmacy.  Pt verbalized understanding and voiced appreciation for the assistance.

## 2019-06-02 NOTE — Telephone Encounter (Signed)
Returned the pt call. Lmtcb. Pt only has one medication (simvaststain) that we are responsible for. Simvastatin refilled #90 R-2 and sent to [facility].

## 2019-06-08 ENCOUNTER — Other Ambulatory Visit: Payer: Self-pay | Admitting: Family Medicine

## 2019-06-09 NOTE — Telephone Encounter (Signed)
Electronic refill request. Indomethacin Last office visit:   10/03/18 Acute Last Filled:   Printed 04/28/2018 Please advise.

## 2019-06-11 NOTE — Telephone Encounter (Signed)
Sent. Thanks.   

## 2019-06-27 ENCOUNTER — Ambulatory Visit: Payer: Self-pay | Admitting: Family Medicine

## 2019-06-27 NOTE — Telephone Encounter (Signed)
Please schedule when possible as 30 min OV and give routine ER cautions in the meantime. Thanks.

## 2019-06-27 NOTE — Telephone Encounter (Signed)
Pt reports increased SOB. States "Has been going on for 2 years since I had clots in my lungs." Asked pt what prompted him to call now, if SOB has worsened recently, states "A little with exertion, I saw DrRockey Situ but thought I should see Dr. Damita Dunnings."  States SOB is intermittent with exertion, states "I can walk 100 yards without resting." States SOB resolves with rest. Also reports increased urination, 4-5 times at night, some urgency, onset 6 months ago.  Pt does take Lasix 40mg  daily, takes in AM.   Care advise given per protocol. Assured pt TN would route to practice for Dr. Josefine Class review.  Please advise:  CB# [phone]  Reason for Disposition . [1] MILD difficulty breathing (e.g., minimal/no SOB at rest, SOB with walking, pulse <100) AND [2] NEW-onset or WORSE than normal  Answer Assessment - Initial Assessment Questions 1. RESPIRATORY STATUS: "Describe your breathing?" (e.g., wheezing, shortness of breath, unable to speak, severe coughing)      Gradual worsening for 2 years 2. ONSET: "When did this breathing problem begin?"      2 years ago 3. PATTERN "Does the difficult breathing come and go, or has it been constant since it started?"      INtermittent, with walking, resolves with rest. CAn walk 100yards 4. SEVERITY: "How bad is your breathing?" (e.g., mild, moderate, severe)    - MILD: No SOB at rest, mild SOB with walking, speaks normally in sentences, can lay down, no retractions, pulse < 100.    - MODERATE: SOB at rest, SOB with minimal exertion and prefers to sit, cannot lie down flat, speaks in phrases, mild retractions, audible wheezing, pulse 100-120.    - SEVERE: Very SOB at rest, speaks in single words, struggling to breathe, sitting hunched forward, retractions, pulse > 120      mild 5. RECURRENT SYMPTOM: "Have you had difficulty breathing before?" If so, ask: "When was the last time?" and "What happened that time?"      For years, "since clots in lungs" 6. CARDIAC  HISTORY: "Do you have any history of heart disease?" (e.g., heart attack, angina, bypass surgery, angioplasty)       7. LUNG HISTORY: "Do you have any history of lung disease?"  (e.g., pulmonary embolus, asthma, emphysema 8. CAUSE: "What do you think is causing the breathing problem?"      unsure 9. OTHER SYMPTOMS: "Do you have any other symptoms? (e.g., dizziness, runny nose, cough, chest pain, fever)    Runny nose, clear, frequent urination x 6 months 11. TRAVEL: "Have you traveled out of the country in the last month?" (e.g., travel history, exposures)       no  Protocols used: BREATHING DIFFICULTY-A-AH

## 2019-06-27 NOTE — Telephone Encounter (Addendum)
Pt notified as instructed and voiced understanding. ED precautions given and pt voiced understanding. Pt scheduled first available appt with Dr Damita Dunnings on 06/29/19 at 8 AM. Leafy Ro cleared 8 AM appt in office).offered pt sooner appt with different provider but pt said only wants to see Dr Damita Dunnings. No other covid symptoms except SOB, no travel and no known exposure to covid. FYI to Dr Damita Dunnings.

## 2019-06-28 NOTE — Telephone Encounter (Signed)
Thanks

## 2019-06-29 ENCOUNTER — Ambulatory Visit (INDEPENDENT_AMBULATORY_CARE_PROVIDER_SITE_OTHER): Payer: Medicare Other | Admitting: Family Medicine

## 2019-06-29 ENCOUNTER — Encounter: Payer: Self-pay | Admitting: Family Medicine

## 2019-06-29 ENCOUNTER — Ambulatory Visit (INDEPENDENT_AMBULATORY_CARE_PROVIDER_SITE_OTHER)
Admission: RE | Admit: 2019-06-29 | Discharge: 2019-06-29 | Disposition: A | Discharge status: Home / Self Care | Payer: Medicare Other | Source: Ambulatory Visit | Attending: Family Medicine | Admitting: Family Medicine

## 2019-06-29 ENCOUNTER — Other Ambulatory Visit: Payer: Self-pay

## 2019-06-29 VITALS — BP 128/64 | HR 63 | Temp 97.2°F | Resp 32 | Ht 70.0 in | Wt 193.0 lb

## 2019-06-29 DIAGNOSIS — R413 Other amnesia: Secondary | ICD-10-CM | POA: Diagnosis not present

## 2019-06-29 DIAGNOSIS — R0602 Shortness of breath: Secondary | ICD-10-CM

## 2019-06-29 DIAGNOSIS — R399 Unspecified symptoms and signs involving the genitourinary system: Secondary | ICD-10-CM | POA: Diagnosis not present

## 2019-06-29 DIAGNOSIS — S51819A Laceration without foreign body of unspecified forearm, initial encounter: Secondary | ICD-10-CM | POA: Diagnosis not present

## 2019-06-29 LAB — CBC WITH DIFFERENTIAL/PLATELET
Basophils Absolute: 0 10*3/uL (ref 0.0–0.1)
Basophils Relative: 0.4 % (ref 0.0–3.0)
Eosinophils Absolute: 0.3 10*3/uL (ref 0.0–0.7)
Eosinophils Relative: 3.4 % (ref 0.0–5.0)
HCT: 37.9 % — ABNORMAL LOW (ref 39.0–52.0)
Hemoglobin: 12.3 g/dL — ABNORMAL LOW (ref 13.0–17.0)
Lymphocytes Relative: 12.9 % (ref 12.0–46.0)
Lymphs Abs: 1.2 10*3/uL (ref 0.7–4.0)
MCHC: 32.5 g/dL (ref 30.0–36.0)
MCV: 97.9 fl (ref 78.0–100.0)
Monocytes Absolute: 0.7 10*3/uL (ref 0.1–1.0)
Monocytes Relative: 7.6 % (ref 3.0–12.0)
Neutro Abs: 7.1 10*3/uL (ref 1.4–7.7)
Neutrophils Relative %: 75.7 % (ref 43.0–77.0)
Platelets: 164 10*3/uL (ref 150.0–400.0)
RBC: 3.87 Mil/uL — ABNORMAL LOW (ref 4.22–5.81)
RDW: 14.8 % (ref 11.5–15.5)
WBC: 9.3 10*3/uL (ref 4.0–10.5)

## 2019-06-29 LAB — COMPREHENSIVE METABOLIC PANEL
ALT: 9 U/L (ref 0–53)
AST: 15 U/L (ref 0–37)
Albumin: 4.2 g/dL (ref 3.5–5.2)
Alkaline Phosphatase: 47 U/L (ref 39–117)
BUN: 51 mg/dL — ABNORMAL HIGH (ref 6–23)
CO2: 31 mEq/L (ref 19–32)
Calcium: 9.5 mg/dL (ref 8.4–10.5)
Chloride: 100 mEq/L (ref 96–112)
Creatinine, Ser: 1.43 mg/dL (ref 0.40–1.50)
GFR: 46.85 mL/min — ABNORMAL LOW (ref >60.00)
Glucose, Bld: 163 mg/dL — ABNORMAL HIGH (ref 70–99)
Potassium: 5.4 mEq/L — ABNORMAL HIGH (ref 3.5–5.1)
Sodium: 140 mEq/L (ref 135–145)
Total Bilirubin: 0.6 mg/dL (ref 0.2–1.2)
Total Protein: 7 g/dL (ref 6.0–8.3)

## 2019-06-29 LAB — VITAMIN B12: Vitamin B-12: 283 pg/mL (ref 211–911)

## 2019-06-29 LAB — BRAIN NATRIURETIC PEPTIDE: Pro B Natriuretic peptide (BNP): 246 pg/mL — ABNORMAL HIGH (ref 0.0–100.0)

## 2019-06-29 LAB — TSH: TSH: 2.2 u[IU]/mL (ref 0.35–4.50)

## 2019-06-29 MED ORDER — SILVER SULFADIAZINE 1 % EX CREA: 1.0000 "application " | TOPICAL_CREAM | Freq: Every day | CUTANEOUS | 0 refills | Status: DC

## 2019-06-29 NOTE — Patient Instructions (Signed)
Use silvadene cream on the skin tears with a nonstick bandage.  Then wrap that up.  Change daily.  Update me as needed.  Go to the lab on the way out.  We'll contact you with your lab and xray report. Don't change your meds yet.  Take care.  Glad to see you.

## 2019-06-29 NOTE — Progress Notes (Signed)
L arm bandaged after falling a few days ago.  He has h/o balance troubles, with h/o vertigo sx.  He is using a cane at baseline.  We talked about his fall risk.  He isn't lightheaded on standing.    Urinary frequency.  Has seen urology.  On lasix.  LUTS are some better overall.  Still with 2-3 beers per day.  He had prev quit for a few weeks.  Discussed limiting alcohol.  SOB.  Has been taking Lasix daily for the last few weeks.  Not better on lasix.  He has exertional SOB, not at rest.    His memory is getting slowly worse, with slowing of recall.  He hasn't gotten lost driving, no red flag events.   Prev CT head with: 1. No evidence of acute intracranial abnormality 2. Atrophy and chronic small-vessel white matter ischemic changes.  He started taking prevagen, d/w pt.  This likely isn't beneficial.  He asked about my kids by name, per his routine.    He needed an order for disposable undergarments given his history of urinary incontinence.  Hardcopy prescription given to patient.  Meds, vitals, and allergies reviewed.   ROS: Per HPI unless specifically indicated in ROS section   GEN: nad, alert and oriented x3 HEENT: ncat NECK: supple w/o LA CV: rrr PULM: ctab, no inc wob ABD: soft, +bs EXT: no edema SKIN: no acute rash but skin tears noted on the left arm.  He has 3 superficially denuded areas, one is 4 x 2 cm, one is 2 x 0.5 cm, one is 3 x 1 cm.  All are clean appearing and not appear infected.  Redressed with Silvadene with nonstick bandage and then outer roll gauze.  Recheck RR 18 at rest.

## 2019-06-30 ENCOUNTER — Other Ambulatory Visit: Payer: Self-pay | Admitting: Family Medicine

## 2019-06-30 DIAGNOSIS — E875 Hyperkalemia: Secondary | ICD-10-CM

## 2019-07-03 DIAGNOSIS — R413 Other amnesia: Secondary | ICD-10-CM | POA: Insufficient documentation

## 2019-07-03 DIAGNOSIS — S51819A Laceration without foreign body of unspecified forearm, initial encounter: Secondary | ICD-10-CM | POA: Insufficient documentation

## 2019-07-03 NOTE — Assessment & Plan Note (Signed)
Okay for outpatient follow-up in terms of the wounds. Use silvadene cream on the skin tears with a nonstick bandage. Change daily.  Update me as needed.  He agrees.  Discussed fall risk reduction.  See above.

## 2019-07-03 NOTE — Assessment & Plan Note (Signed)
He needed an order for disposable undergarments given his history of urinary incontinence.  Hardcopy prescription given to patient.

## 2019-07-03 NOTE — Assessment & Plan Note (Signed)
Alcohol cessation encouraged.  See notes on labs.  No red flag symptoms.  Alert and oriented x3.

## 2019-07-03 NOTE — Assessment & Plan Note (Signed)
See notes on labs. >25 minutes spent in face to face time with patient, >50% spent in counselling or coordination of care  He is mildly anemic but this is actually improved from before and should not explain his breathing changes. Normal LFTs. His kidney function is slightly worse than his baseline from a year ago but this likely reflects recent Lasix use. I would continue the Lasix for now given that his BNP is elevated. He has a history of mildly decreased ejection fraction noted on previous echo.    He does have a mild elevation in his potassium which I think is likely a false positive.   Several things to consider. We can work this up from a primary cardiac versus primary pulmonary standpoint. If albuterol helps his breathing a lot, then it makes sense to approach this from a pulmonary standpoint and get follow-up lung testing/pulmonary eval done. If albuterol does not help his symptoms a lot, then it may be reasonable to repeat his echo.  We will see how much relief he gets from albuterol and then have him let me know so I can put in the follow-up orders.   In the meantime I think it does make sense to recheck his kidney function and potassium at a nonfasting lab visit. See orders.

## 2019-07-04 ENCOUNTER — Other Ambulatory Visit: Payer: Self-pay | Admitting: Family Medicine

## 2019-07-04 DIAGNOSIS — R0602 Shortness of breath: Secondary | ICD-10-CM

## 2019-07-04 MED ORDER — POTASSIUM CHLORIDE CRYS ER 20 MEQ PO TBCR: EXTENDED_RELEASE_TABLET | ORAL | Status: DC

## 2019-07-06 ENCOUNTER — Other Ambulatory Visit (INDEPENDENT_AMBULATORY_CARE_PROVIDER_SITE_OTHER): Payer: Medicare Other

## 2019-07-06 ENCOUNTER — Other Ambulatory Visit: Payer: Self-pay

## 2019-07-06 DIAGNOSIS — E875 Hyperkalemia: Secondary | ICD-10-CM | POA: Diagnosis not present

## 2019-07-06 LAB — BASIC METABOLIC PANEL
BUN: 48 mg/dL — ABNORMAL HIGH (ref 6–23)
CO2: 30 mEq/L (ref 19–32)
Calcium: 9.3 mg/dL (ref 8.4–10.5)
Chloride: 99 mEq/L (ref 96–112)
Creatinine, Ser: 1.5 mg/dL (ref 0.40–1.50)
GFR: 44.33 mL/min — ABNORMAL LOW (ref >60.00)
Glucose, Bld: 118 mg/dL — ABNORMAL HIGH (ref 70–99)
Potassium: 4.9 mEq/L (ref 3.5–5.1)
Sodium: 137 mEq/L (ref 135–145)

## 2019-07-07 ENCOUNTER — Telehealth: Payer: Self-pay

## 2019-07-07 NOTE — Telephone Encounter (Signed)
Left message for patient to call back  

## 2019-07-11 ENCOUNTER — Encounter: Payer: Medicare Other | Admitting: Family Medicine

## 2019-07-11 ENCOUNTER — Ambulatory Visit: Payer: Medicare Other

## 2019-07-18 ENCOUNTER — Telehealth: Payer: Self-pay

## 2019-07-18 NOTE — Telephone Encounter (Signed)
lmov to reschedule echo

## 2019-07-19 NOTE — Telephone Encounter (Signed)
Mailed new appt time.

## 2019-07-24 ENCOUNTER — Other Ambulatory Visit: Payer: Medicare Other

## 2019-08-10 ENCOUNTER — Other Ambulatory Visit: Payer: Self-pay

## 2019-08-10 ENCOUNTER — Ambulatory Visit (INDEPENDENT_AMBULATORY_CARE_PROVIDER_SITE_OTHER): Payer: Medicare Other

## 2019-08-10 DIAGNOSIS — R0602 Shortness of breath: Secondary | ICD-10-CM

## 2019-08-23 ENCOUNTER — Other Ambulatory Visit: Payer: Self-pay | Admitting: Family Medicine

## 2019-08-23 NOTE — Telephone Encounter (Signed)
Sent. Thanks.   

## 2019-08-23 NOTE — Telephone Encounter (Signed)
Electronic refill request Indocin Last refill 06/11/19 #180 Last office visit 06/29/19

## 2019-08-29 ENCOUNTER — Emergency Department: Payer: Medicare Other

## 2019-08-29 ENCOUNTER — Emergency Department
Admission: EM | Admit: 2019-08-29 | Discharge: 2019-08-29 | Disposition: A | Discharge status: Home / Self Care | Payer: Medicare Other | Attending: Emergency Medicine | Admitting: Emergency Medicine

## 2019-08-29 ENCOUNTER — Other Ambulatory Visit: Payer: Self-pay

## 2019-08-29 DIAGNOSIS — Z87891 Personal history of nicotine dependence: Secondary | ICD-10-CM | POA: Insufficient documentation

## 2019-08-29 DIAGNOSIS — E039 Hypothyroidism, unspecified: Secondary | ICD-10-CM | POA: Diagnosis not present

## 2019-08-29 DIAGNOSIS — Z79899 Other long term (current) drug therapy: Secondary | ICD-10-CM | POA: Insufficient documentation

## 2019-08-29 DIAGNOSIS — W101XXA Fall (on)(from) sidewalk curb, initial encounter: Secondary | ICD-10-CM | POA: Diagnosis not present

## 2019-08-29 DIAGNOSIS — S0003XA Contusion of scalp, initial encounter: Secondary | ICD-10-CM | POA: Diagnosis not present

## 2019-08-29 DIAGNOSIS — S2241XA Multiple fractures of ribs, right side, initial encounter for closed fracture: Secondary | ICD-10-CM | POA: Diagnosis not present

## 2019-08-29 DIAGNOSIS — I5032 Chronic diastolic (congestive) heart failure: Secondary | ICD-10-CM | POA: Diagnosis not present

## 2019-08-29 DIAGNOSIS — W19XXXA Unspecified fall, initial encounter: Secondary | ICD-10-CM | POA: Diagnosis not present

## 2019-08-29 DIAGNOSIS — S0511XA Contusion of eyeball and orbital tissues, right eye, initial encounter: Secondary | ICD-10-CM | POA: Diagnosis not present

## 2019-08-29 DIAGNOSIS — Z7982 Long term (current) use of aspirin: Secondary | ICD-10-CM | POA: Insufficient documentation

## 2019-08-29 DIAGNOSIS — Y9389 Activity, other specified: Secondary | ICD-10-CM | POA: Insufficient documentation

## 2019-08-29 DIAGNOSIS — Y92481 Parking lot as the place of occurrence of the external cause: Secondary | ICD-10-CM | POA: Diagnosis not present

## 2019-08-29 DIAGNOSIS — S199XXA Unspecified injury of neck, initial encounter: Secondary | ICD-10-CM | POA: Diagnosis not present

## 2019-08-29 DIAGNOSIS — I110 Hypertensive heart disease with heart failure: Secondary | ICD-10-CM | POA: Insufficient documentation

## 2019-08-29 DIAGNOSIS — Y999 Unspecified external cause status: Secondary | ICD-10-CM | POA: Insufficient documentation

## 2019-08-29 DIAGNOSIS — S0083XA Contusion of other part of head, initial encounter: Secondary | ICD-10-CM

## 2019-08-29 DIAGNOSIS — E119 Type 2 diabetes mellitus without complications: Secondary | ICD-10-CM | POA: Diagnosis not present

## 2019-08-29 DIAGNOSIS — J449 Chronic obstructive pulmonary disease, unspecified: Secondary | ICD-10-CM | POA: Diagnosis not present

## 2019-08-29 DIAGNOSIS — S0990XA Unspecified injury of head, initial encounter: Secondary | ICD-10-CM | POA: Diagnosis not present

## 2019-08-29 DIAGNOSIS — S299XXA Unspecified injury of thorax, initial encounter: Secondary | ICD-10-CM | POA: Diagnosis not present

## 2019-08-29 DIAGNOSIS — S51811A Laceration without foreign body of right forearm, initial encounter: Secondary | ICD-10-CM

## 2019-08-29 DIAGNOSIS — R52 Pain, unspecified: Secondary | ICD-10-CM | POA: Diagnosis not present

## 2019-08-29 MED ORDER — TRAMADOL HCL 50 MG PO TABS: 50.0000 mg | ORAL_TABLET | Freq: Three times a day (TID) | ORAL | 0 refills | Status: AC | PRN

## 2019-08-29 MED ORDER — HYDROCODONE-ACETAMINOPHEN 5-325 MG PO TABS
1.0000 | ORAL_TABLET | Freq: Once | ORAL | Status: AC
  Administered 2019-08-29: 1 via ORAL
  Filled 2019-08-29: qty 1

## 2019-08-29 NOTE — Discharge Instructions (Addendum)
Your exam and CT scans do not show any serious head injury, brain bleeding, or spinal fractures. Yo do have some fractures to the 4th & 5th ribs. Take the pain medicine as needed. You can expect to have bruising and swelling for a few days. Follow-up with your provider or return as needed.

## 2019-08-29 NOTE — ED Triage Notes (Signed)
Pt walking in parking lot from getting flu shot tripped and fell and hit his head. Pt has right eye hematoma, right arm skin tear, right rib pain, no loc, no back or neck pain and is on no blood thinners.

## 2019-08-29 NOTE — ED Notes (Signed)
See triage note.  States he tripped  [place] face first bruising noted to right eye/side of face   Skin tears to right arm/hand  Having pain to right anterior rib area
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
## 2019-09-04 ENCOUNTER — Ambulatory Visit: Payer: Medicare Other | Admitting: Cardiovascular Disease

## 2019-09-19 ENCOUNTER — Other Ambulatory Visit: Payer: Self-pay | Admitting: *Deleted

## 2019-09-19 DIAGNOSIS — Z20822 Contact with and (suspected) exposure to covid-19: Secondary | ICD-10-CM

## 2019-09-19 DIAGNOSIS — Z20828 Contact with and (suspected) exposure to other viral communicable diseases: Secondary | ICD-10-CM | POA: Diagnosis not present

## 2019-09-21 LAB — NOVEL CORONAVIRUS, NAA: SARS-CoV-2, NAA: NOT DETECTED

## 2019-09-28 DIAGNOSIS — R351 Nocturia: Secondary | ICD-10-CM | POA: Diagnosis not present

## 2019-09-28 DIAGNOSIS — N401 Enlarged prostate with lower urinary tract symptoms: Secondary | ICD-10-CM | POA: Diagnosis not present

## 2019-09-28 DIAGNOSIS — Z87440 Personal history of urinary (tract) infections: Secondary | ICD-10-CM | POA: Diagnosis not present

## 2019-09-28 DIAGNOSIS — N3941 Urge incontinence: Secondary | ICD-10-CM | POA: Diagnosis not present
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
## 2019-10-16 ENCOUNTER — Other Ambulatory Visit: Payer: Self-pay

## 2019-10-16 ENCOUNTER — Ambulatory Visit (INDEPENDENT_AMBULATORY_CARE_PROVIDER_SITE_OTHER): Payer: Medicare Other | Admitting: Cardiovascular Disease

## 2019-10-16 ENCOUNTER — Encounter: Payer: Self-pay | Admitting: Cardiovascular Disease

## 2019-10-16 VITALS — BP 124/60 | HR 66 | Ht 69.0 in | Wt 192.5 lb

## 2019-10-16 DIAGNOSIS — I77810 Thoracic aortic ectasia: Secondary | ICD-10-CM | POA: Diagnosis not present

## 2019-10-16 DIAGNOSIS — I5032 Chronic diastolic (congestive) heart failure: Secondary | ICD-10-CM

## 2019-10-16 DIAGNOSIS — E782 Mixed hyperlipidemia: Secondary | ICD-10-CM | POA: Diagnosis not present

## 2019-10-16 DIAGNOSIS — R0602 Shortness of breath: Secondary | ICD-10-CM | POA: Diagnosis not present

## 2019-10-16 DIAGNOSIS — I272 Pulmonary hypertension, unspecified: Secondary | ICD-10-CM

## 2019-10-16 DIAGNOSIS — I25118 Atherosclerotic heart disease of native coronary artery with other forms of angina pectoris: Secondary | ICD-10-CM | POA: Diagnosis not present

## 2019-10-16 DIAGNOSIS — I739 Peripheral vascular disease, unspecified: Secondary | ICD-10-CM

## 2019-10-16 NOTE — Patient Instructions (Addendum)

## 2019-12-09 ENCOUNTER — Other Ambulatory Visit: Payer: Self-pay | Admitting: Family Medicine

## 2019-12-12 NOTE — Telephone Encounter (Signed)
Sent.  Needs yearly wellness visit when possible the spring.  Thanks.

## 2019-12-12 NOTE — Telephone Encounter (Signed)
Mychart message sent to pt informing him of Dr. Josefine Class message.

## 2020-01-05 ENCOUNTER — Other Ambulatory Visit: Payer: Self-pay | Admitting: Cardiovascular Disease

## 2020-01-15 ENCOUNTER — Other Ambulatory Visit: Payer: Self-pay | Admitting: Family Medicine

## 2020-02-10 ENCOUNTER — Other Ambulatory Visit: Payer: Self-pay | Admitting: Cardiovascular Disease

## 2020-02-22 DIAGNOSIS — K264 Chronic or unspecified duodenal ulcer with hemorrhage: Secondary | ICD-10-CM

## 2020-02-22 DIAGNOSIS — K259 Gastric ulcer, unspecified as acute or chronic, without hemorrhage or perforation: Secondary | ICD-10-CM

## 2020-02-22 HISTORY — DX: Chronic or unspecified duodenal ulcer with hemorrhage: K26.4

## 2020-02-22 HISTORY — DX: Gastric ulcer, unspecified as acute or chronic, without hemorrhage or perforation: K25.9

## 2020-03-01 ENCOUNTER — Inpatient Hospital Stay (HOSPITAL_COMMUNITY)
Admission: EM | Admit: 2020-03-01 | Discharge: 2020-03-06 | DRG: 378 | Disposition: A | Discharge status: Skilled Nursing Facility | Payer: Medicare Other | Attending: Internal Medicine | Admitting: Internal Medicine

## 2020-03-01 ENCOUNTER — Encounter (HOSPITAL_COMMUNITY): Payer: Self-pay

## 2020-03-01 ENCOUNTER — Emergency Department (HOSPITAL_COMMUNITY): Payer: Medicare Other

## 2020-03-01 ENCOUNTER — Other Ambulatory Visit: Payer: Self-pay

## 2020-03-01 DIAGNOSIS — E785 Hyperlipidemia, unspecified: Secondary | ICD-10-CM | POA: Diagnosis present

## 2020-03-01 DIAGNOSIS — N400 Enlarged prostate without lower urinary tract symptoms: Secondary | ICD-10-CM | POA: Diagnosis not present

## 2020-03-01 DIAGNOSIS — K254 Chronic or unspecified gastric ulcer with hemorrhage: Secondary | ICD-10-CM | POA: Diagnosis present

## 2020-03-01 DIAGNOSIS — T39395A Adverse effect of other nonsteroidal anti-inflammatory drugs [NSAID], initial encounter: Secondary | ICD-10-CM | POA: Diagnosis present

## 2020-03-01 DIAGNOSIS — Z95828 Presence of other vascular implants and grafts: Secondary | ICD-10-CM | POA: Diagnosis not present

## 2020-03-01 DIAGNOSIS — F329 Major depressive disorder, single episode, unspecified: Secondary | ICD-10-CM | POA: Diagnosis present

## 2020-03-01 DIAGNOSIS — R278 Other lack of coordination: Secondary | ICD-10-CM | POA: Diagnosis not present

## 2020-03-01 DIAGNOSIS — K2950 Unspecified chronic gastritis without bleeding: Secondary | ICD-10-CM | POA: Diagnosis not present

## 2020-03-01 DIAGNOSIS — K2951 Unspecified chronic gastritis with bleeding: Secondary | ICD-10-CM | POA: Diagnosis present

## 2020-03-01 DIAGNOSIS — N1831 Chronic kidney disease, stage 3a: Secondary | ICD-10-CM | POA: Diagnosis present

## 2020-03-01 DIAGNOSIS — Z87442 Personal history of urinary calculi: Secondary | ICD-10-CM

## 2020-03-01 DIAGNOSIS — Z7401 Bed confinement status: Secondary | ICD-10-CM | POA: Diagnosis not present

## 2020-03-01 DIAGNOSIS — R42 Dizziness and giddiness: Secondary | ICD-10-CM | POA: Diagnosis not present

## 2020-03-01 DIAGNOSIS — I714 Abdominal aortic aneurysm, without rupture: Secondary | ICD-10-CM | POA: Diagnosis present

## 2020-03-01 DIAGNOSIS — Z87891 Personal history of nicotine dependence: Secondary | ICD-10-CM

## 2020-03-01 DIAGNOSIS — Z86718 Personal history of other venous thrombosis and embolism: Secondary | ICD-10-CM

## 2020-03-01 DIAGNOSIS — N289 Disorder of kidney and ureter, unspecified: Secondary | ICD-10-CM | POA: Diagnosis present

## 2020-03-01 DIAGNOSIS — K922 Gastrointestinal hemorrhage, unspecified: Secondary | ICD-10-CM | POA: Diagnosis not present

## 2020-03-01 DIAGNOSIS — E119 Type 2 diabetes mellitus without complications: Secondary | ICD-10-CM | POA: Diagnosis not present

## 2020-03-01 DIAGNOSIS — Z8249 Family history of ischemic heart disease and other diseases of the circulatory system: Secondary | ICD-10-CM | POA: Diagnosis not present

## 2020-03-01 DIAGNOSIS — Z20822 Contact with and (suspected) exposure to covid-19: Secondary | ICD-10-CM | POA: Diagnosis present

## 2020-03-01 DIAGNOSIS — I10 Essential (primary) hypertension: Secondary | ICD-10-CM | POA: Diagnosis not present

## 2020-03-01 DIAGNOSIS — Z741 Need for assistance with personal care: Secondary | ICD-10-CM | POA: Diagnosis not present

## 2020-03-01 DIAGNOSIS — F419 Anxiety disorder, unspecified: Secondary | ICD-10-CM | POA: Diagnosis present

## 2020-03-01 DIAGNOSIS — K269 Duodenal ulcer, unspecified as acute or chronic, without hemorrhage or perforation: Secondary | ICD-10-CM | POA: Diagnosis not present

## 2020-03-01 DIAGNOSIS — K259 Gastric ulcer, unspecified as acute or chronic, without hemorrhage or perforation: Secondary | ICD-10-CM | POA: Diagnosis not present

## 2020-03-01 DIAGNOSIS — Z86711 Personal history of pulmonary embolism: Secondary | ICD-10-CM

## 2020-03-01 DIAGNOSIS — K920 Hematemesis: Secondary | ICD-10-CM | POA: Diagnosis not present

## 2020-03-01 DIAGNOSIS — I739 Peripheral vascular disease, unspecified: Secondary | ICD-10-CM | POA: Diagnosis not present

## 2020-03-01 DIAGNOSIS — Z8719 Personal history of other diseases of the digestive system: Secondary | ICD-10-CM

## 2020-03-01 DIAGNOSIS — I2510 Atherosclerotic heart disease of native coronary artery without angina pectoris: Secondary | ICD-10-CM | POA: Diagnosis present

## 2020-03-01 DIAGNOSIS — Z833 Family history of diabetes mellitus: Secondary | ICD-10-CM

## 2020-03-01 DIAGNOSIS — M6281 Muscle weakness (generalized): Secondary | ICD-10-CM | POA: Diagnosis not present

## 2020-03-01 DIAGNOSIS — Z7982 Long term (current) use of aspirin: Secondary | ICD-10-CM | POA: Diagnosis not present

## 2020-03-01 DIAGNOSIS — E782 Mixed hyperlipidemia: Secondary | ICD-10-CM | POA: Diagnosis not present

## 2020-03-01 DIAGNOSIS — Z96641 Presence of right artificial hip joint: Secondary | ICD-10-CM | POA: Diagnosis present

## 2020-03-01 DIAGNOSIS — K760 Fatty (change of) liver, not elsewhere classified: Secondary | ICD-10-CM | POA: Diagnosis present

## 2020-03-01 DIAGNOSIS — D649 Anemia, unspecified: Secondary | ICD-10-CM | POA: Diagnosis not present

## 2020-03-01 DIAGNOSIS — I4510 Unspecified right bundle-branch block: Secondary | ICD-10-CM | POA: Diagnosis not present

## 2020-03-01 DIAGNOSIS — Z79899 Other long term (current) drug therapy: Secondary | ICD-10-CM

## 2020-03-01 DIAGNOSIS — M2550 Pain in unspecified joint: Secondary | ICD-10-CM | POA: Diagnosis not present

## 2020-03-01 DIAGNOSIS — K5730 Diverticulosis of large intestine without perforation or abscess without bleeding: Secondary | ICD-10-CM | POA: Diagnosis present

## 2020-03-01 DIAGNOSIS — J449 Chronic obstructive pulmonary disease, unspecified: Secondary | ICD-10-CM | POA: Diagnosis not present

## 2020-03-01 DIAGNOSIS — K264 Chronic or unspecified duodenal ulcer with hemorrhage: Secondary | ICD-10-CM | POA: Diagnosis present

## 2020-03-01 DIAGNOSIS — M109 Gout, unspecified: Secondary | ICD-10-CM | POA: Diagnosis present

## 2020-03-01 DIAGNOSIS — R52 Pain, unspecified: Secondary | ICD-10-CM | POA: Diagnosis not present

## 2020-03-01 DIAGNOSIS — R4189 Other symptoms and signs involving cognitive functions and awareness: Secondary | ICD-10-CM | POA: Diagnosis not present

## 2020-03-01 DIAGNOSIS — F29 Unspecified psychosis not due to a substance or known physiological condition: Secondary | ICD-10-CM | POA: Diagnosis not present

## 2020-03-01 DIAGNOSIS — K5900 Constipation, unspecified: Secondary | ICD-10-CM | POA: Diagnosis present

## 2020-03-01 DIAGNOSIS — D62 Acute posthemorrhagic anemia: Secondary | ICD-10-CM | POA: Diagnosis present

## 2020-03-01 DIAGNOSIS — K2970 Gastritis, unspecified, without bleeding: Secondary | ICD-10-CM | POA: Diagnosis not present

## 2020-03-01 DIAGNOSIS — I959 Hypotension, unspecified: Secondary | ICD-10-CM | POA: Diagnosis not present

## 2020-03-01 DIAGNOSIS — G4733 Obstructive sleep apnea (adult) (pediatric): Secondary | ICD-10-CM | POA: Diagnosis not present

## 2020-03-01 DIAGNOSIS — R1110 Vomiting, unspecified: Secondary | ICD-10-CM | POA: Diagnosis not present

## 2020-03-01 DIAGNOSIS — K921 Melena: Secondary | ICD-10-CM | POA: Diagnosis not present

## 2020-03-01 DIAGNOSIS — E279 Disorder of adrenal gland, unspecified: Secondary | ICD-10-CM | POA: Diagnosis present

## 2020-03-01 DIAGNOSIS — E039 Hypothyroidism, unspecified: Secondary | ICD-10-CM | POA: Diagnosis not present

## 2020-03-01 DIAGNOSIS — R58 Hemorrhage, not elsewhere classified: Secondary | ICD-10-CM | POA: Diagnosis not present

## 2020-03-01 DIAGNOSIS — Z743 Need for continuous supervision: Secondary | ICD-10-CM | POA: Diagnosis not present

## 2020-03-01 LAB — COMPREHENSIVE METABOLIC PANEL
ALT: 12 U/L (ref 0–44)
AST: 18 U/L (ref 15–41)
Albumin: 2.9 g/dL — ABNORMAL LOW (ref 3.5–5.0)
Alkaline Phosphatase: 43 U/L (ref 38–126)
Anion gap: 10 (ref 5–15)
BUN: 62 mg/dL — ABNORMAL HIGH (ref 8–23)
CO2: 27 mmol/L (ref 22–32)
Calcium: 9.1 mg/dL (ref 8.9–10.3)
Chloride: 103 mmol/L (ref 98–111)
Creatinine, Ser: 1.53 mg/dL — ABNORMAL HIGH (ref 0.61–1.24)
GFR calc Af Amer: 47 mL/min — ABNORMAL LOW (ref >60)
GFR calc non Af Amer: 40 mL/min — ABNORMAL LOW (ref >60)
Glucose, Bld: 138 mg/dL — ABNORMAL HIGH (ref 70–99)
Potassium: 4.7 mmol/L (ref 3.5–5.1)
Sodium: 140 mmol/L (ref 135–145)
Total Bilirubin: 0.7 mg/dL (ref 0.3–1.2)
Total Protein: 5.9 g/dL — ABNORMAL LOW (ref 6.5–8.1)

## 2020-03-01 LAB — CBC
HCT: 29.2 % — ABNORMAL LOW (ref 39.0–52.0)
Hemoglobin: 9.1 g/dL — ABNORMAL LOW (ref 13.0–17.0)
MCH: 30.5 pg (ref 26.0–34.0)
MCHC: 31.2 g/dL (ref 30.0–36.0)
MCV: 98 fL (ref 80.0–100.0)
Platelets: 154 10*3/uL (ref 150–400)
RBC: 2.98 MIL/uL — ABNORMAL LOW (ref 4.22–5.81)
RDW: 13.7 % (ref 11.5–15.5)
WBC: 8 10*3/uL (ref 4.0–10.5)
nRBC: 0 % (ref 0.0–0.2)

## 2020-03-01 LAB — URINALYSIS, ROUTINE W REFLEX MICROSCOPIC
Bilirubin Urine: NEGATIVE
Glucose, UA: NEGATIVE mg/dL
Hgb urine dipstick: NEGATIVE
Ketones, ur: NEGATIVE mg/dL
Leukocytes,Ua: NEGATIVE
Nitrite: NEGATIVE
Protein, ur: NEGATIVE mg/dL
Specific Gravity, Urine: 1.036 — ABNORMAL HIGH (ref 1.005–1.030)
pH: 5 (ref 5.0–8.0)

## 2020-03-01 LAB — CBC WITH DIFFERENTIAL/PLATELET
Abs Immature Granulocytes: 0.11 10*3/uL — ABNORMAL HIGH (ref 0.00–0.07)
Basophils Absolute: 0 10*3/uL (ref 0.0–0.1)
Basophils Relative: 0 %
Eosinophils Absolute: 0.1 10*3/uL (ref 0.0–0.5)
Eosinophils Relative: 1 %
HCT: 30.6 % — ABNORMAL LOW (ref 39.0–52.0)
Hemoglobin: 9.5 g/dL — ABNORMAL LOW (ref 13.0–17.0)
Immature Granulocytes: 1 %
Lymphocytes Relative: 8 %
Lymphs Abs: 0.9 10*3/uL (ref 0.7–4.0)
MCH: 30.4 pg (ref 26.0–34.0)
MCHC: 31 g/dL (ref 30.0–36.0)
MCV: 97.8 fL (ref 80.0–100.0)
Monocytes Absolute: 0.7 10*3/uL (ref 0.1–1.0)
Monocytes Relative: 7 %
Neutro Abs: 8.6 10*3/uL — ABNORMAL HIGH (ref 1.7–7.7)
Neutrophils Relative %: 83 %
Platelets: 162 10*3/uL (ref 150–400)
RBC: 3.13 MIL/uL — ABNORMAL LOW (ref 4.22–5.81)
RDW: 13.8 % (ref 11.5–15.5)
WBC: 10.4 10*3/uL (ref 4.0–10.5)
nRBC: 0 % (ref 0.0–0.2)

## 2020-03-01 LAB — PROTIME-INR
INR: 1.2 (ref 0.8–1.2)
Prothrombin Time: 14.8 seconds (ref 11.4–15.2)

## 2020-03-01 LAB — TYPE AND SCREEN
ABO/RH(D): O POS
Antibody Screen: NEGATIVE

## 2020-03-01 LAB — RESPIRATORY PANEL BY RT PCR (FLU A&B, COVID)
Influenza A by PCR: NEGATIVE
Influenza B by PCR: NEGATIVE
SARS Coronavirus 2 by RT PCR: NEGATIVE

## 2020-03-01 LAB — POC OCCULT BLOOD, ED: Fecal Occult Bld: POSITIVE — AB

## 2020-03-01 LAB — LIPASE, BLOOD: Lipase: 34 U/L (ref 11–51)

## 2020-03-01 MED ORDER — ACETAMINOPHEN 650 MG RE SUPP: 650.0000 mg | Freq: Four times a day (QID) | RECTAL | Status: DC | PRN

## 2020-03-01 MED ORDER — ALLOPURINOL 100 MG PO TABS
100.0000 mg | ORAL_TABLET | Freq: Every day | ORAL | Status: DC
  Administered 2020-03-02 – 2020-03-06 (×5): 100 mg via ORAL
  Filled 2020-03-01 (×5): qty 1

## 2020-03-01 MED ORDER — IOHEXOL 300 MG/ML  SOLN
100.0000 mL | Freq: Once | INTRAMUSCULAR | Status: AC | PRN
  Administered 2020-03-01: 18:00:00 100 mL via INTRAVENOUS

## 2020-03-01 MED ORDER — VITAMIN D 25 MCG (1000 UNIT) PO TABS
2000.0000 [IU] | ORAL_TABLET | Freq: Every day | ORAL | Status: DC
  Administered 2020-03-01 – 2020-03-06 (×6): 2000 [IU] via ORAL
  Filled 2020-03-01 (×6): qty 2

## 2020-03-01 MED ORDER — SIMVASTATIN 20 MG PO TABS
20.0000 mg | ORAL_TABLET | Freq: Every day | ORAL | Status: DC
  Administered 2020-03-01 – 2020-03-05 (×5): 20 mg via ORAL
  Filled 2020-03-01 (×5): qty 1

## 2020-03-01 MED ORDER — ASPIRIN EC 81 MG PO TBEC
81.0000 mg | DELAYED_RELEASE_TABLET | Freq: Every day | ORAL | Status: DC
  Administered 2020-03-01: 21:00:00 81 mg via ORAL
  Filled 2020-03-01: qty 1

## 2020-03-01 MED ORDER — DOCUSATE SODIUM 100 MG PO CAPS
100.0000 mg | ORAL_CAPSULE | Freq: Two times a day (BID) | ORAL | Status: AC
  Administered 2020-03-01 – 2020-03-02 (×3): 100 mg via ORAL
  Filled 2020-03-01 (×3): qty 1

## 2020-03-01 MED ORDER — PANTOPRAZOLE SODIUM 40 MG IV SOLR
40.0000 mg | Freq: Two times a day (BID) | INTRAVENOUS | Status: DC
  Administered 2020-03-02 (×2): 40 mg via INTRAVENOUS
  Filled 2020-03-01 (×2): qty 40

## 2020-03-01 MED ORDER — ALBUTEROL SULFATE (2.5 MG/3ML) 0.083% IN NEBU: 2.5000 mg | INHALATION_SOLUTION | Freq: Four times a day (QID) | RESPIRATORY_TRACT | Status: DC | PRN

## 2020-03-01 MED ORDER — CITALOPRAM HYDROBROMIDE 40 MG PO TABS
40.0000 mg | ORAL_TABLET | Freq: Every day | ORAL | Status: DC
  Administered 2020-03-02 – 2020-03-06 (×5): 40 mg via ORAL
  Filled 2020-03-01 (×5): qty 1

## 2020-03-01 MED ORDER — PANTOPRAZOLE SODIUM 40 MG IV SOLR
40.0000 mg | Freq: Once | INTRAVENOUS | Status: AC
  Administered 2020-03-01: 15:00:00 40 mg via INTRAVENOUS
  Filled 2020-03-01: qty 40

## 2020-03-01 MED ORDER — ACETAMINOPHEN 325 MG PO TABS
650.0000 mg | ORAL_TABLET | Freq: Four times a day (QID) | ORAL | Status: DC | PRN
  Administered 2020-03-02 – 2020-03-06 (×7): 650 mg via ORAL
  Filled 2020-03-01 (×7): qty 2

## 2020-03-01 NOTE — ED Notes (Signed)
Currently out of covid swabs, mini lab is calling main lab.

## 2020-03-01 NOTE — ED Notes (Signed)
Cleaned stool from pt, before occult test. C/o constipation but appears to have had diarrhea.
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
## 2020-03-01 NOTE — ED Triage Notes (Signed)
Pt bib by [facility] from home. For lower ab pain, N,V beginning at 9:30 this am after having coffee. Pt has been constipated for 1 week. Pt bp 90/46, given 275ml NS, the 100/50. Pt HOH, A&O x4. Pt not on thinners, takes 81 mg ASA daily.

## 2020-03-01 NOTE — ED Notes (Signed)
Pt noted to have hearing aid in right ear.

## 2020-03-01 NOTE — Plan of Care (Signed)
NEW ADMISSION

## 2020-03-01 NOTE — ED Notes (Signed)
Pt made aware of the need for urine.

## 2020-03-01 NOTE — ED Notes (Signed)
Removed catheter from chart. Pt did not have a catheter upon arrival to the ED.

## 2020-03-01 NOTE — ED Notes (Signed)
Lab contacted to add on INR
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
## 2020-03-01 NOTE — ED Provider Notes (Signed)
Care assumed from [facility], PA-C, at shift change, please see their notes for full documentation of patient's complaint/HPI. Briefly, pt here with constipation x 1 week, N/V that started today with coffee ground emesis/melenotic stool. Results so far show guaiac positive stool. Awaiting labwork and CT scan as pt had some abdominal TTP. Plan is to consult GI and admit to GI bleed. Protonix already onboard.    Physical Exam  BP (!) 129/55   Pulse 64   Temp 97.6 F (36.4 C) (Oral)   Resp 12   Ht 5\' 10"  (1.778 m)   Wt 83.9 kg   SpO2 98%   BMI 26.54 kg/m   Physical Exam  ED Course/Procedures   Clinical Course as of Mar 01 1741  Fri Mar 01, 2020  1459 Constipation x 1 week; N/V today with coffee ground emesis. Upper GI bleed concern. Plan: wait for labs, GI consult, admit   [MV]    Clinical Course User Index [MV] Eustaquio Maize, PA-C    Procedures  Results for orders placed or performed during the hospital encounter of 03/01/20  Respiratory Panel by RT PCR (Flu A&B, Covid) - Nasopharyngeal Swab   Specimen: Nasopharyngeal Swab  Result Value Ref Range   SARS Coronavirus 2 by RT PCR NEGATIVE NEGATIVE   Influenza A by PCR NEGATIVE NEGATIVE   Influenza B by PCR NEGATIVE NEGATIVE  Comprehensive metabolic panel  Result Value Ref Range   Sodium 140 135 - 145 mmol/L   Potassium 4.7 3.5 - 5.1 mmol/L   Chloride 103 98 - 111 mmol/L   CO2 27 22 - 32 mmol/L   Glucose, Bld 138 (H) 70 - 99 mg/dL   BUN 62 (H) 8 - 23 mg/dL   Creatinine, Ser 1.53 (H) 0.61 - 1.24 mg/dL   Calcium 9.1 8.9 - 10.3 mg/dL   Total Protein 5.9 (L) 6.5 - 8.1 g/dL   Albumin 2.9 (L) 3.5 - 5.0 g/dL   AST 18 15 - 41 U/L   ALT 12 0 - 44 U/L   Alkaline Phosphatase 43 38 - 126 U/L   Total Bilirubin 0.7 0.3 - 1.2 mg/dL   GFR calc non Af Amer 40 (L) >60 mL/min   GFR calc Af Amer 47 (L) >60 mL/min   Anion gap 10 5 - 15  CBC with Differential  Result Value Ref Range   WBC 10.4 4.0 - 10.5 K/uL   RBC 3.13 (L) 4.22 -  5.81 MIL/uL   Hemoglobin 9.5 (L) 13.0 - 17.0 g/dL   HCT 30.6 (L) 39.0 - 52.0 %   MCV 97.8 80.0 - 100.0 fL   MCH 30.4 26.0 - 34.0 pg   MCHC 31.0 30.0 - 36.0 g/dL   RDW 13.8 11.5 - 15.5 %   Platelets 162 150 - 400 K/uL   nRBC 0.0 0.0 - 0.2 %   Neutrophils Relative % 83 %   Neutro Abs 8.6 (H) 1.7 - 7.7 K/uL   Lymphocytes Relative 8 %   Lymphs Abs 0.9 0.7 - 4.0 K/uL   Monocytes Relative 7 %   Monocytes Absolute 0.7 0.1 - 1.0 K/uL   Eosinophils Relative 1 %   Eosinophils Absolute 0.1 0.0 - 0.5 K/uL   Basophils Relative 0 %   Basophils Absolute 0.0 0.0 - 0.1 K/uL   Immature Granulocytes 1 %   Abs Immature Granulocytes 0.11 (H) 0.00 - 0.07 K/uL  Lipase, blood  Result Value Ref Range   Lipase 34 11 - 51 U/L  POC occult blood, ED Provider will collect  Result Value Ref Range   Fecal Occult Bld POSITIVE (A) NEGATIVE  Type and screen [facility]  Result Value Ref Range   ABO/RH(D) O POS    Antibody Screen NEG    Sample Expiration      03/04/2020,2359 Performed at [facility], [place] [address], [place], [place] [address]     MDM   CBC without leukocytosis. Hgb 9.5; baseline ~ 11.0  Hemoglobin  Date Value Ref Range Status  03/01/2020 9.5 (L) 13.0 - 17.0 g/dL Final  06/29/2019 12.3 (L) 13.0 - 17.0 g/dL Final  11/08/2018 11.6 (L) 13.0 - 17.0 g/dL Final  08/09/2018 11.0 (L) 13.0 - 17.0 g/dL Final   HGB  Date Value Ref Range Status  02/18/2015 12.7 (L) 13.0 - 18.0 g/dL Final  12/03/2014 13.9 13.0 - 18.0 g/dL Final  11/07/2014 14.4 13.0 - 18.0 g/dL Final  11/06/2014 14.7 13.0 - 18.0 g/dL Final   CMP with creatinine 1.53 which appears to be around pt's baseline. Glucose 138. BUN 62.  Lipase 34.  CT scan without acute findings; does show renal lesion with recommendations for outpatient MRI.   6:01 PM Discussed case with Dr. Havery Moros with [facility]; recommends NPO after midnight and will eval pt in the morning. Would like a baseline INR; have ordered.    6:15 PM Discussed case with [facility] Dr. Roosevelt Locks who agrees to accept patient for admission.     Eustaquio Maize, PA-C 03/01/20 1816    Charlesetta Shanks, MD 03/01/20 [phone]

## 2020-03-01 NOTE — ED Provider Notes (Signed)
Medical screening examination/treatment/procedure(s) were conducted as a shared visit with non-physician practitioner(s) and myself.  I personally evaluated the patient during the encounter.    86 year old male who presents with abdominal discomfort with initial constipation but while he was here had a dark stool that was guaiac positive.  Does drink 3 beers a day and has a history of peptic ulcer disease.  Suspect upper GI bleed.  Will start Protonix and consult GI and admit pending labs   Lacretia Leigh, MD 03/01/20 1450

## 2020-03-02 ENCOUNTER — Encounter (HOSPITAL_COMMUNITY): Payer: Self-pay | Admitting: Internal Medicine

## 2020-03-02 ENCOUNTER — Encounter (HOSPITAL_COMMUNITY)
Admission: EM | Disposition: A | Discharge status: Skilled Nursing Facility | Payer: Self-pay | Source: Home / Self Care | Attending: Internal Medicine

## 2020-03-02 ENCOUNTER — Inpatient Hospital Stay (HOSPITAL_COMMUNITY): Payer: Medicare Other | Admitting: Certified Registered Nurse Anesthetist

## 2020-03-02 DIAGNOSIS — K269 Duodenal ulcer, unspecified as acute or chronic, without hemorrhage or perforation: Secondary | ICD-10-CM

## 2020-03-02 DIAGNOSIS — K259 Gastric ulcer, unspecified as acute or chronic, without hemorrhage or perforation: Secondary | ICD-10-CM

## 2020-03-02 HISTORY — PX: BIOPSY: SHX5522

## 2020-03-02 HISTORY — PX: HOT HEMOSTASIS: SHX5433

## 2020-03-02 HISTORY — PX: ESOPHAGOGASTRODUODENOSCOPY (EGD) WITH PROPOFOL: SHX5813

## 2020-03-02 HISTORY — PX: SCLEROTHERAPY: SHX6841

## 2020-03-02 LAB — CBC
HCT: 28.3 % — ABNORMAL LOW (ref 39.0–52.0)
Hemoglobin: 8.9 g/dL — ABNORMAL LOW (ref 13.0–17.0)
MCH: 30.7 pg (ref 26.0–34.0)
MCHC: 31.4 g/dL (ref 30.0–36.0)
MCV: 97.6 fL (ref 80.0–100.0)
Platelets: 142 10*3/uL — ABNORMAL LOW (ref 150–400)
RBC: 2.9 MIL/uL — ABNORMAL LOW (ref 4.22–5.81)
RDW: 13.7 % (ref 11.5–15.5)
WBC: 9.4 10*3/uL (ref 4.0–10.5)
nRBC: 0 % (ref 0.0–0.2)

## 2020-03-02 LAB — HEMOGLOBIN AND HEMATOCRIT, BLOOD
HCT: 27.7 % — ABNORMAL LOW (ref 39.0–52.0)
HCT: 26.4 % — ABNORMAL LOW (ref 39.0–52.0)
Hemoglobin: 8.9 g/dL — ABNORMAL LOW (ref 13.0–17.0)
Hemoglobin: 8.2 g/dL — ABNORMAL LOW (ref 13.0–17.0)

## 2020-03-02 LAB — BASIC METABOLIC PANEL
Anion gap: 7 (ref 5–15)
BUN: 57 mg/dL — ABNORMAL HIGH (ref 8–23)
CO2: 24 mmol/L (ref 22–32)
Calcium: 9 mg/dL (ref 8.9–10.3)
Chloride: 107 mmol/L (ref 98–111)
Creatinine, Ser: 1.16 mg/dL (ref 0.61–1.24)
GFR calc Af Amer: >60 mL/min (ref >60)
GFR calc non Af Amer: 56 mL/min — ABNORMAL LOW (ref >60)
Glucose, Bld: 80 mg/dL (ref 70–99)
Potassium: 4.3 mmol/L (ref 3.5–5.1)
Sodium: 138 mmol/L (ref 135–145)

## 2020-03-02 SURGERY — ESOPHAGOGASTRODUODENOSCOPY (EGD) WITH PROPOFOL
Anesthesia: Monitor Anesthesia Care

## 2020-03-02 MED ORDER — LABETALOL HCL 5 MG/ML IV SOLN
5.0000 mg | Freq: Once | INTRAVENOUS | Status: AC
  Administered 2020-03-02: 16:00:00 5 mg via INTRAVENOUS

## 2020-03-02 MED ORDER — LACTATED RINGERS IV SOLN: INTRAVENOUS | Status: DC | PRN

## 2020-03-02 MED ORDER — PROPOFOL 10 MG/ML IV BOLUS
INTRAVENOUS | Status: DC | PRN
  Administered 2020-03-02: 20 mg via INTRAVENOUS

## 2020-03-02 MED ORDER — PHENYLEPHRINE 40 MCG/ML (10ML) SYRINGE FOR IV PUSH (FOR BLOOD PRESSURE SUPPORT)
PREFILLED_SYRINGE | INTRAVENOUS | Status: DC | PRN
  Administered 2020-03-02: 80 ug via INTRAVENOUS

## 2020-03-02 MED ORDER — PROPOFOL 500 MG/50ML IV EMUL
INTRAVENOUS | Status: DC | PRN
  Administered 2020-03-02: 100 ug/kg/min via INTRAVENOUS

## 2020-03-02 MED ORDER — LACTATED RINGERS IV SOLN: INTRAVENOUS | Status: DC

## 2020-03-02 MED ORDER — LABETALOL HCL 5 MG/ML IV SOLN
INTRAVENOUS | Status: AC
  Filled 2020-03-02: qty 4

## 2020-03-02 MED ORDER — SODIUM CHLORIDE (PF) 0.9 % IJ SOLN
PREFILLED_SYRINGE | INTRAMUSCULAR | Status: DC | PRN
  Administered 2020-03-02: 5.5 mL

## 2020-03-02 SURGICAL SUPPLY — 15 items

## 2020-03-02 NOTE — Progress Notes (Signed)
Pt received Epi in procedure for treatment of duodenal ulcer.  Pt BP post procedure in 200s and complaining of pain level of 5 in upper abdomen and chest.  MD Ossey notified and new orders given.  Pt transferred to PACU for further management of BP.  Endo RN reported off to PACU nurse and also provided report to [place].

## 2020-03-02 NOTE — Interval H&P Note (Signed)
History and Physical Interval Note:  03/02/2020 2:53 PM  Christopher Santiago  has presented today for surgery, with the diagnosis of coffee ground emesis.  The various methods of treatment have been discussed with the patient and family. After consideration of risks, benefits and other options for treatment, the patient has consented to  Procedure(s): ESOPHAGOGASTRODUODENOSCOPY (EGD) WITH PROPOFOL (N/A) as a surgical intervention.  The patient's history has been reviewed, patient examined, no change in status, stable for surgery.  I have reviewed the patient's chart and labs.  Questions were answered to the patient's satisfaction.     [name]
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
## 2020-03-02 NOTE — Progress Notes (Signed)
PROGRESS NOTE    Christopher Santiago  JIR:[id number] DOB: 02/15/1931 DOA: 03/01/2020 PCP: Tonia Ghent, MD     Brief Narrative:  Christopher Santiago is a 86 y.o. male with medical history significant of ascending aortic aneurysm,COPD, anemia, Hx of PE and DVT,PVD presented with constipation, nausea and emesis.Usually, patient moves his bowels once in 2-3 days, but starting about two weeks ago, his bowel habit changed, with severe constipation for about one week. He has been drinking a laxative tea also tried to manually disimpact himself and with only a small amount of stool removed. Almost same time, he also had on and off abd pain, sharp like, peri-umbilical.Today he had sudden onset of nausea and vomiting. He has had 3 episodes of coffee-ground emesis however patient admits to drinking coffee just prior to vomiting.After vomiting he reports feeling much better. He drinks alcohol daily admitting to 4 beers x 70 years and also take ibuprofen for joints pain when needed, no recent increase in NSAID use.Patient is not anticoagulated, onlytakes aspirin daily.   Patient admitted with concerns for GI bleed.  GI was consulted.  New events last 24 hours / Subjective: Has not had any further nausea, vomiting or bowel movement since admission.  Continues to have some lower abdominal soreness.  Assessment & Plan:   Active Problems:   GI bleed   Lower GI bleed   GI bleed -GI consulted -History of alcohol use, daily baby aspirin -hold aspirin -FOBT positive -CT abdomen pelvis found sigmoid diverticulosis  -IV PPI -Trend H&H, hemoglobin 9.5 --> 8.9  CKD stage IIIa -Baseline creatinine 1-1.5  -Stable  HLD -Continue zocor   Gout -Allopurinol. Hold indomethacin  Depression/anxiety -Continue celexa   Fatty liver -Seen on CT A/P   Left kidney 2 cm lesion Nonspecific 3 cm right adrenal lesion -Follow up outpatient abdominal MRI   Infrarenal AAA -3.4cm. Follow up with [place] in 3 years      DVT prophylaxis: SCD Code Status: Full code Family Communication: No family at bedside Disposition Plan:  . Patient is from home prior to admission. . Currently in-hospital treatment needed due to GI bleed work-up. . Suspect patient will discharge to home in 1 to 2 days.    Consultants:   GI  Procedures:   None  Antimicrobials:  Anti-infectives (From admission, onward)   None        Objective: Vitals:   03/01/20 2001 03/01/20 2022 03/02/20 0144 03/02/20 0452  BP:  (!) 159/64 (!) 126/52 (!) 136/55  Pulse: 64 70 67 62  Resp: 18 20  18   Temp:  97.8 F (36.6 C) 98.6 F (37 C) 98.3 F (36.8 C)  TempSrc:  Oral Oral Oral  SpO2: 97% 100% 95% 96%  Weight:  82.1 kg    Height:  5\' 9"  (1.753 m)      Intake/Output Summary (Last 24 hours) at 03/02/2020 1013 Last data filed at 03/02/2020 0650 Gross per 24 hour  Intake 250 ml  Output 1350 ml  Net -1100 ml   Filed Weights   03/01/20 1427 03/01/20 2022  Weight: 83.9 kg 82.1 kg    Examination:  General exam: Appears calm and comfortable  Respiratory system: Clear to auscultation. Respiratory effort normal. No respiratory distress. No conversational dyspnea.  Cardiovascular system: S1 & S2 heard, RRR. No murmurs. No pedal edema. Gastrointestinal system: Abdomen is nondistended, soft and mildly tender to palpation bilateral lower quadrants. Normal bowel sounds heard. Central nervous system: Alert and oriented. No focal  neurological deficits. Speech clear.  Extremities: Symmetric in appearance  Skin: No rashes, lesions or ulcers on exposed skin  Psychiatry: Judgement and insight appear normal. Mood & affect appropriate.   Data Reviewed: I have personally reviewed following labs and imaging studies  CBC: Recent Labs  Lab 03/01/20 1500 03/01/20 2200 03/02/20 0351  WBC 10.4 8.0 9.4  NEUTROABS 8.6*  --   --   HGB 9.5* 9.1* 8.9*  HCT 30.6* 29.2* 28.3*  MCV 97.8 98.0 97.6  PLT 162 154 417*   Basic Metabolic  Panel: Recent Labs  Lab 03/01/20 1500 03/02/20 0351  NA 140 138  K 4.7 4.3  CL 103 107  CO2 27 24  GLUCOSE 138* 80  BUN 62* 57*  CREATININE 1.53* 1.16  CALCIUM 9.1 9.0   GFR: Estimated Creatinine Clearance: 44.9 mL/min (by C-G formula based on SCr of 1.16 mg/dL). Liver Function Tests: Recent Labs  Lab 03/01/20 1500  AST 18  ALT 12  ALKPHOS 43  BILITOT 0.7  PROT 5.9*  ALBUMIN 2.9*   Recent Labs  Lab 03/01/20 1500  LIPASE 34   No results for input(s): AMMONIA in the last 168 hours. Coagulation Profile: Recent Labs  Lab 03/01/20 1801  INR 1.2   Cardiac Enzymes: No results for input(s): CKTOTAL, CKMB, CKMBINDEX, TROPONINI in the last 168 hours. BNP (last 3 results) Recent Labs    06/29/19 0858  PROBNP 246.0*   HbA1C: No results for input(s): HGBA1C in the last 72 hours. CBG: No results for input(s): GLUCAP in the last 168 hours. Lipid Profile: No results for input(s): CHOL, HDL, LDLCALC, TRIG, CHOLHDL, LDLDIRECT in the last 72 hours. Thyroid Function Tests: No results for input(s): TSH, T4TOTAL, FREET4, T3FREE, THYROIDAB in the last 72 hours. Anemia Panel: No results for input(s): VITAMINB12, FOLATE, FERRITIN, TIBC, IRON, RETICCTPCT in the last 72 hours. Sepsis Labs: No results for input(s): PROCALCITON, LATICACIDVEN in the last 168 hours.  Recent Results (from the past 240 hour(s))  Respiratory Panel by RT PCR (Flu A&B, Covid) - Nasopharyngeal Swab     Status: None   Collection Time: 03/01/20  2:44 PM   Specimen: Nasopharyngeal Swab  Result Value Ref Range Status   SARS Coronavirus 2 by RT PCR NEGATIVE NEGATIVE Final    Comment: (NOTE) SARS-CoV-2 target nucleic acids are NOT DETECTED. The SARS-CoV-2 RNA is generally detectable in upper respiratoy specimens during the acute phase of infection. The lowest concentration of SARS-CoV-2 viral copies this assay can detect is 131 copies/mL. A negative result does not preclude SARS-Cov-2 infection and  should not be used as the sole basis for treatment or other patient management decisions. A negative result may occur with  improper specimen collection/handling, submission of specimen other than nasopharyngeal swab, presence of viral mutation(s) within the areas targeted by this assay, and inadequate number of viral copies (<131 copies/mL). A negative result must be combined with clinical observations, patient history, and epidemiological information. The expected result is Negative. Fact Sheet for Patients:  PinkCheek.be Fact Sheet for Healthcare Providers:  GravelBags.it This test is not yet ap proved or cleared by the [place] FDA and  has been authorized for detection and/or diagnosis of SARS-CoV-2 by FDA under an Emergency Use Authorization (EUA). This EUA will remain  in effect (meaning this test can be used) for the duration of the COVID-19 declaration under Section 564(b)(1) of the Act, 21 U.S.C. section 360bbb-3(b)(1), unless the authorization is terminated or revoked sooner.    Influenza A by  PCR NEGATIVE NEGATIVE Final   Influenza B by PCR NEGATIVE NEGATIVE Final    Comment: (NOTE) The Xpert Xpress SARS-CoV-2/FLU/RSV assay is intended as an aid in  the diagnosis of influenza from Nasopharyngeal swab specimens and  should not be used as a sole basis for treatment. Nasal washings and  aspirates are unacceptable for Xpert Xpress SARS-CoV-2/FLU/RSV  testing. Fact Sheet for Patients: PinkCheek.be Fact Sheet for Healthcare Providers: GravelBags.it This test is not yet approved or cleared by the [place] FDA and  has been authorized for detection and/or diagnosis of SARS-CoV-2 by  FDA under an Emergency Use Authorization (EUA). This EUA will remain  in effect (meaning this test can be used) for the duration of the  Covid-19 declaration under Section  564(b)(1) of the Act, 21  U.S.C. section 360bbb-3(b)(1), unless the authorization is  terminated or revoked. Performed at [facility], [place] [address] [place], [place] [address]       Radiology Studies: CT ABDOMEN PELVIS W CONTRAST  Result Date: 03/01/2020 CLINICAL DATA:  Constipation, generalized abdominal pain, nausea, vomiting EXAM: CT ABDOMEN AND PELVIS WITH CONTRAST TECHNIQUE: Multidetector CT imaging of the abdomen and pelvis was performed using the standard protocol following bolus administration of intravenous contrast. CONTRAST:  148mL OMNIPAQUE IOHEXOL 300 MG/ML  SOLN COMPARISON:  None. FINDINGS: Lower chest: No acute abnormality. Hepatobiliary: Mild diffuse low-density throughout the liver compatible with fatty infiltration. 9 mm low-density lesion in the right hepatic lobe, likely small cyst. Gallbladder unremarkable. Pancreas: No focal abnormality or ductal dilatation. Spleen: 1.6 cm low-density lesion in the spleen, likely small cyst or hemangioma. Normal size. Adrenals/Urinary Tract: 3 cm lesion in the right adrenal gland. Low-density lesion within the midpole of the left kidney measures 2 cm. This does not appear cystic. Cyst in the lower pole of the left kidney measures 2 cm. No hydronephrosis. Small cyst in the upper pole of the right kidney. Urinary bladder unremarkable. Stomach/Bowel: Sigmoid diverticulosis. No active diverticulitis. Bowel decompressed. No evidence of bowel obstruction. Vascular/Lymphatic: 3.4 cm infrarenal abdominal aortic aneurysm. Aortic atherosclerosis. IVC filter in place. No adenopathy. Reproductive: No visible focal abnormality. Other: This No free fluid or free air. Musculoskeletal: Prior right hip replacement. No acute bony abnormality. IMPRESSION: Mild diffuse fatty infiltration of the liver. Small cysts in the kidneys. There is a 2 cm lesion in the midpole of the left kidney which does not appear cystic. Cannot exclude solid renal lesion. When the  patient is clinically stable and able to follow directions and hold their breath (preferably as an outpatient) further evaluation with dedicated abdominal MRI should be considered if felt clinically indicated. 3.4 cm infrarenal abdominal aortic aneurysm. Recommend followup by ultrasound in 3 years. This recommendation follows ACR consensus guidelines: White Paper of the ACR Incidental Findings Committee II on Vascular Findings. J Am Coll Radiol 2013; 10:789-794. Aortic aneurysm NOS (ICD10-I71.9) Nonspecific 3 cm right adrenal lesion. This could also be evaluated with MRI as well. Sigmoid diverticulosis.  No active diverticulitis. Electronically Signed   By: Rolm Baptise M.D.   On: 03/01/2020 17:38      Scheduled Meds: . allopurinol  100 mg Oral Daily  . aspirin EC  81 mg Oral Daily  . cholecalciferol  2,000 Units Oral Daily  . citalopram  40 mg Oral Daily  . docusate sodium  100 mg Oral BID  . pantoprazole (PROTONIX) IV  40 mg Intravenous Q12H  . simvastatin  20 mg Oral QHS   Continuous Infusions:  LOS: 1 day      Time spent: 35 minutes   Dessa Phi, DO [facility] 03/02/2020, 10:13 AM   Available via Epic secure chat 7am-7pm After these hours, please refer to coverage provider listed on [email or web address]

## 2020-03-02 NOTE — Transfer of Care (Signed)
Immediate Anesthesia Transfer of Care Note  Patient: Christopher Santiago  Procedure(s) Performed: ESOPHAGOGASTRODUODENOSCOPY (EGD) WITH PROPOFOL (N/A ) HOT HEMOSTASIS (ARGON PLASMA COAGULATION/BICAP) (N/A ) SCLEROTHERAPY  Patient Location: Endoscopy Unit  Anesthesia Type:MAC  Level of Consciousness: awake, alert  and oriented  Airway & Oxygen Therapy: Patient Spontanous Breathing  Post-op Assessment: Report given to RN and Post -op Vital signs reviewed and stable  Post vital signs: Reviewed and stable  Last Vitals:  Vitals Value Taken Time  BP    Temp    Pulse    Resp    SpO2      Last Pain:  Vitals:   03/02/20 1438  TempSrc: Axillary  PainSc: 0-No pain         Complications: No apparent anesthesia complications

## 2020-03-02 NOTE — Anesthesia Postprocedure Evaluation (Signed)
Anesthesia Post Note  Patient: Christopher Santiago  Procedure(s) Performed: ESOPHAGOGASTRODUODENOSCOPY (EGD) WITH PROPOFOL (N/A ) HOT HEMOSTASIS (ARGON PLASMA COAGULATION/BICAP) (N/A ) SCLEROTHERAPY     Patient location during evaluation: PACU Anesthesia Type: MAC Level of consciousness: awake and alert Pain management: pain level controlled Vital Signs Assessment: post-procedure vital signs reviewed and stable Respiratory status: spontaneous breathing, nonlabored ventilation, respiratory function stable and patient connected to nasal cannula oxygen Cardiovascular status: stable and blood pressure returned to baseline Postop Assessment: no apparent nausea or vomiting Anesthetic complications: no    Last Vitals:  Vitals:   03/02/20 1640 03/02/20 1655  BP: (!) 152/58 (!) 147/62  Pulse: 66 66  Resp: 17 18  Temp:    SpO2: 99%     Last Pain:  Vitals:   03/02/20 1655  TempSrc:   PainSc: 1                  Nykole Matos DAVID
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
## 2020-03-02 NOTE — Op Note (Signed)
[facility] Patient Name: Christopher Santiago Procedure Date : 03/02/2020 MRN: [id number] Attending MD: Carlota Raspberry. Havery Moros , MD Date of Birth: [birth year] CSN: [id number] Age: 86 Admit Type: Inpatient Procedure:                Upper GI endoscopy Indications:              Suspected upper gastrointestinal bleeding - melana,                            coffee ground emesis, anemia, rise in BUN - history                            of NSAID use Providers:                Carlota Raspberry. Havery Moros, MD, Josie Dixon, RN,                            Benetta Spar RN, RN, Cherylynn Ridges, Technician Referring MD:              Medicines:                Monitored Anesthesia Care Complications:            No immediate complications. Estimated blood loss:                            Minimal. Estimated Blood Loss:     Estimated blood loss was minimal. Procedure:                Pre-Anesthesia Assessment:                           - Prior to the procedure, a History and Physical                            was performed, and patient medications and                            allergies were reviewed. The patient's tolerance of                            previous anesthesia was also reviewed. The risks                            and benefits of the procedure and the sedation                            options and risks were discussed with the patient.                            All questions were answered, and informed consent                            was obtained. Prior Anticoagulants: The patient has  taken no previous anticoagulant or antiplatelet                            agents. ASA Grade Assessment: III - A patient with                            severe systemic disease. After reviewing the risks                            and benefits, the patient was deemed in                            satisfactory condition to undergo the procedure.                           After  obtaining informed consent, the endoscope was                            passed under direct vision. Throughout the                            procedure, the patient's blood pressure, pulse, and                            oxygen saturations were monitored continuously. The                            GIF-H190 ([id number]) Olympus gastroscope was                            introduced through the mouth, and advanced to the                            second part of duodenum. The upper GI endoscopy was                            accomplished without difficulty. The patient                            tolerated the procedure well. Scope In: Scope Out: Findings:      The examined esophagus was normal.      Seven cratered gastric ulcers with no stigmata of bleeding were found in       the gastric antrum. All roughly 3cm in diameter, no high risk stigmata       appreciated.      Diffuse mild inflammation characterized by erythema and granularity was       found in the entire examined stomach. Biopsies were taken with a cold       forceps for Helicobacter pylori testing.      The exam of the stomach was otherwise normal.      One cratered duodenal ulcer was found at the entrance of the duodenal       bulb, just on the other side of the pylorus. The lesion was 10-12 mm in  largest dimension. There was a heaped up pigmented area with old heme,       suspected site of prior bleeding, ? vessel. Fulguration to ablate the       lesion to prevent further bleeding by monopolar probe was successful.       Area was successfully injected in 4 quadrants with 6 mL of a 1:10,000       solution of epinephrine for drug delivery.      The exam of the duodenum was otherwise normal. Impression:               - Normal esophagus.                           - 7 small clean based gastric ulcers with no                            stigmata of bleeding.                           - Gastritis. Biopsied to rule out H  pylori                           - Large duodenal bulb ulcer. Lesion. Treated with a                            monopolar probe. Injected. Recommendation:           - Return patient to hospital ward for ongoing care.                           - NPO now, clear liquid diet if the patient is                            feeling well.                           - Continue present medications                           - IV protonix for 72 hours, IV 40mg  BID                           - Await pathology results.                           - Trend Hgb, monitor for recurrent bleeding                           - Will follow, call with questions Procedure Code(s):        --- Professional ---                           37628, 59, Esophagogastroduodenoscopy, flexible,                            transoral; with control of bleeding, any method  86767, Esophagogastroduodenoscopy, flexible,                            transoral; with biopsy, single or multiple                           43236, 15, Esophagogastroduodenoscopy, flexible,                            transoral; with directed submucosal injection(s),                            any substance Diagnosis Code(s):        --- Professional ---                           K25.9, Gastric ulcer, unspecified as acute or                            chronic, without hemorrhage or perforation                           K29.70, Gastritis, unspecified, without bleeding                           K26.9, Duodenal ulcer, unspecified as acute or                            chronic, without hemorrhage or perforation CPT copyright 2019 American Medical Association. All rights reserved. The codes documented in this report are preliminary and upon coder review may  be revised to meet current compliance requirements. Remo Lipps P. Etheline Geppert, MD 03/02/2020 3:30:08 PM This report has been signed electronically. Number of Addenda: 0

## 2020-03-02 NOTE — Anesthesia Preprocedure Evaluation (Addendum)
Anesthesia Evaluation  Patient identified by MRN, date of birth, ID band Patient awake    Reviewed: Allergy & Precautions, NPO status , Patient's Chart, lab work & pertinent test results  Airway Mallampati: I  TM Distance: >3 FB Neck ROM: Full    Dental   Pulmonary sleep apnea , COPD, former smoker,    Pulmonary exam normal        Cardiovascular hypertension, Pt. on medications + CAD  Normal cardiovascular exam     Neuro/Psych Depression    GI/Hepatic   Endo/Other  diabetes, Type 2  Renal/GU      Musculoskeletal   Abdominal   Peds  Hematology   Anesthesia Other Findings   Reproductive/Obstetrics                             Anesthesia Physical Anesthesia Plan  ASA: III  Anesthesia Plan: MAC   Post-op Pain Management:    Induction: Intravenous  PONV Risk Score and Plan: 1 and Treatment may vary due to age or medical condition  Airway Management Planned: Nasal Cannula  Additional Equipment:   Intra-op Plan:   Post-operative Plan:   Informed Consent: I have reviewed the patients History and Physical, chart, labs and discussed the procedure including the risks, benefits and alternatives for the proposed anesthesia with the patient or authorized representative who has indicated his/her understanding and acceptance.       Plan Discussed with: CRNA and Surgeon  Anesthesia Plan Comments:         Anesthesia Quick Evaluation

## 2020-03-03 DIAGNOSIS — K922 Gastrointestinal hemorrhage, unspecified: Secondary | ICD-10-CM

## 2020-03-03 LAB — HEMOGLOBIN AND HEMATOCRIT, BLOOD
HCT: 26.7 % — ABNORMAL LOW (ref 39.0–52.0)
Hemoglobin: 8.2 g/dL — ABNORMAL LOW (ref 13.0–17.0)

## 2020-03-03 LAB — BASIC METABOLIC PANEL
Anion gap: 9 (ref 5–15)
BUN: 28 mg/dL — ABNORMAL HIGH (ref 8–23)
CO2: 25 mmol/L (ref 22–32)
Calcium: 9.1 mg/dL (ref 8.9–10.3)
Chloride: 106 mmol/L (ref 98–111)
Creatinine, Ser: 1.08 mg/dL (ref 0.61–1.24)
GFR calc Af Amer: >60 mL/min (ref >60)
GFR calc non Af Amer: >60 mL/min (ref >60)
Glucose, Bld: 80 mg/dL (ref 70–99)
Potassium: 4 mmol/L (ref 3.5–5.1)
Sodium: 140 mmol/L (ref 135–145)

## 2020-03-03 LAB — CBC
HCT: 28.3 % — ABNORMAL LOW (ref 39.0–52.0)
Hemoglobin: 8.7 g/dL — ABNORMAL LOW (ref 13.0–17.0)
MCH: 30.2 pg (ref 26.0–34.0)
MCHC: 30.7 g/dL (ref 30.0–36.0)
MCV: 98.3 fL (ref 80.0–100.0)
Platelets: 150 10*3/uL (ref 150–400)
RBC: 2.88 MIL/uL — ABNORMAL LOW (ref 4.22–5.81)
RDW: 14 % (ref 11.5–15.5)
WBC: 7.5 10*3/uL (ref 4.0–10.5)
nRBC: 0 % (ref 0.0–0.2)

## 2020-03-03 MED ORDER — PANTOPRAZOLE SODIUM 40 MG IV SOLR: 40.0000 mg | Freq: Two times a day (BID) | INTRAVENOUS | Status: DC

## 2020-03-03 MED ORDER — SODIUM CHLORIDE 0.9 % IV SOLN
8.0000 mg/h | INTRAVENOUS | Status: AC
  Administered 2020-03-03 – 2020-03-06 (×7): 8 mg/h via INTRAVENOUS
  Filled 2020-03-03 (×9): qty 80

## 2020-03-03 NOTE — Plan of Care (Signed)
  Problem: Education: Goal: Knowledge of General Education information will improve Description: Including pain rating scale, medication(s)/side effects and non-pharmacologic comfort measures Outcome: Progressing   Problem: Activity: Goal: Risk for activity intolerance will decrease Outcome: Progressing   Problem: Nutrition: Goal: Adequate nutrition will be maintained Outcome: Progressing   

## 2020-03-03 NOTE — Progress Notes (Signed)
PROGRESS NOTE    Christopher Santiago  GUR:[id number] DOB: 1931-09-04 DOA: 03/01/2020 PCP: Tonia Ghent, MD     Brief Narrative:  Christopher Santiago is a 86 y.o. male with medical history significant of ascending aortic aneurysm,COPD, anemia, Hx of PE and DVT,PVD presented with constipation, nausea and emesis.Usually, patient moves his bowels once in 2-3 days, but starting about two weeks ago, his bowel habit changed, with severe constipation for about one week. He has been drinking a laxative tea also tried to manually disimpact himself and with only a small amount of stool removed. Almost same time, he also had on and off abd pain, sharp like, peri-umbilical.Today he had sudden onset of nausea and vomiting. He has had 3 episodes of coffee-ground emesis however patient admits to drinking coffee just prior to vomiting.After vomiting he reports feeling much better. He drinks alcohol daily admitting to 4 beers x 70 years and also take ibuprofen for joints pain when needed, no recent increase in NSAID use.Patient is not anticoagulated, onlytakes aspirin daily.   Patient admitted with concerns for GI bleed.  GI was consulted. Patient underwent EGD 4/10 which revealed 7 small clean based gastric ulcers with no stigmata of bleeding, gastritis, large duodenal bulb ulcer treated with monopolar probe.   New events last 24 hours / Subjective: No BM or N/V. Tolerating clear liquid diet without any issues.   Assessment & Plan:   Active Problems:   Upper GI bleed   Lower GI bleed   GI bleed -GI consulted -History of alcohol use, daily baby aspirin -hold aspirin, resume as outpatient 0Stop indomethacin  -FOBT positive -CT abdomen pelvis found sigmoid diverticulosis  -S/p EGD 4/10 which revealed 7 small clean based gastric ulcers with no stigmata of bleeding, gastritis, large duodenal bulb ulcer treated with monopolar probe. Will need repeat EGD in 2 months.  -IV PPI -Await biopsies, rule out H  Pylori  -Trend H&H  CKD stage IIIa -Baseline creatinine 1-1.5  -Stable   HLD -Continue zocor   Gout -Allopurinol. Hold indomethacin  Depression/anxiety -Continue celexa   Fatty liver -Seen on CT A/P   Left kidney 2 cm lesion Nonspecific 3 cm right adrenal lesion -Follow up outpatient abdominal MRI   Infrarenal AAA -3.4cm. Follow up with [place] in 3 years    DVT prophylaxis: SCD Code Status: Full code Family Communication: No family at bedside Disposition Plan:  Status is: Inpatient  Remains inpatient appropriate because:IV treatments appropriate due to intensity of illness or inability to take PO   Dispo: The patient is from: Home              Anticipated d/c is to: Home              Anticipated d/c date is: 2 days              Patient currently is not medically stable to d/c.   Consultants:   GI  Procedures:   None  Antimicrobials:  Anti-infectives (From admission, onward)   None       Objective: Vitals:   03/02/20 1655 03/02/20 1703 03/02/20 1721 03/03/20 0446  BP: (!) 147/62  (!) 143/69 (!) 143/61  Pulse: 66 65 67 67  Resp: 18 17 17 15   Temp:  97.6 F (36.4 C) 97.8 F (36.6 C) 97.6 F (36.4 C)  TempSrc:   Oral Oral  SpO2:  99% 96% 95%  Weight:      Height:  Intake/Output Summary (Last 24 hours) at 03/03/2020 1216 Last data filed at 03/03/2020 1021 Gross per 24 hour  Intake 1162 ml  Output 1400 ml  Net -238 ml   Filed Weights   03/01/20 1427 03/01/20 2022 03/02/20 1438  Weight: 83.9 kg 82.1 kg 82.1 kg    Examination: General exam: Appears calm and comfortable  Respiratory system: Clear to auscultation. Respiratory effort normal. Cardiovascular system: S1 & S2 heard, RRR. No pedal edema. Gastrointestinal system: Abdomen is nondistended, soft and nontender. Normal bowel sounds heard. Central nervous system: Alert and oriented. Non focal exam. Speech clear  Extremities: Symmetric in appearance bilaterally  Skin: No rashes,  lesions or ulcers on exposed skin  Psychiatry: Judgement and insight appear stable. Mood & affect appropriate.    Data Reviewed: I have personally reviewed following labs and imaging studies  CBC: Recent Labs  Lab 03/01/20 1500 03/01/20 1500 03/01/20 2200 03/02/20 0351 03/02/20 1320 03/02/20 2112 03/03/20 0441  WBC 10.4  --  8.0 9.4  --   --  7.5  NEUTROABS 8.6*  --   --   --   --   --   --   HGB 9.5*   < > 9.1* 8.9* 8.9* 8.2* 8.7*  HCT 30.6*   < > 29.2* 28.3* 27.7* 26.4* 28.3*  MCV 97.8  --  98.0 97.6  --   --  98.3  PLT 162  --  154 142*  --   --  150   < > = values in this interval not displayed.   Basic Metabolic Panel: Recent Labs  Lab 03/01/20 1500 03/02/20 0351 03/03/20 0441  NA 140 138 140  K 4.7 4.3 4.0  CL 103 107 106  CO2 27 24 25   GLUCOSE 138* 80 80  BUN 62* 57* 28*  CREATININE 1.53* 1.16 1.08  CALCIUM 9.1 9.0 9.1   GFR: Estimated Creatinine Clearance: 48.2 mL/min (by C-G formula based on SCr of 1.08 mg/dL). Liver Function Tests: Recent Labs  Lab 03/01/20 1500  AST 18  ALT 12  ALKPHOS 43  BILITOT 0.7  PROT 5.9*  ALBUMIN 2.9*   Recent Labs  Lab 03/01/20 1500  LIPASE 34   No results for input(s): AMMONIA in the last 168 hours. Coagulation Profile: Recent Labs  Lab 03/01/20 1801  INR 1.2   Cardiac Enzymes: No results for input(s): CKTOTAL, CKMB, CKMBINDEX, TROPONINI in the last 168 hours. BNP (last 3 results) Recent Labs    06/29/19 0858  PROBNP 246.0*   HbA1C: No results for input(s): HGBA1C in the last 72 hours. CBG: No results for input(s): GLUCAP in the last 168 hours. Lipid Profile: No results for input(s): CHOL, HDL, LDLCALC, TRIG, CHOLHDL, LDLDIRECT in the last 72 hours. Thyroid Function Tests: No results for input(s): TSH, T4TOTAL, FREET4, T3FREE, THYROIDAB in the last 72 hours. Anemia Panel: No results for input(s): VITAMINB12, FOLATE, FERRITIN, TIBC, IRON, RETICCTPCT in the last 72 hours. Sepsis Labs: No results for  input(s): PROCALCITON, LATICACIDVEN in the last 168 hours.  Recent Results (from the past 240 hour(s))  Respiratory Panel by RT PCR (Flu A&B, Covid) - Nasopharyngeal Swab     Status: None   Collection Time: 03/01/20  2:44 PM   Specimen: Nasopharyngeal Swab  Result Value Ref Range Status   SARS Coronavirus 2 by RT PCR NEGATIVE NEGATIVE Final    Comment: (NOTE) SARS-CoV-2 target nucleic acids are NOT DETECTED. The SARS-CoV-2 RNA is generally detectable in upper respiratoy specimens during the acute phase  of infection. The lowest concentration of SARS-CoV-2 viral copies this assay can detect is 131 copies/mL. A negative result does not preclude SARS-Cov-2 infection and should not be used as the sole basis for treatment or other patient management decisions. A negative result may occur with  improper specimen collection/handling, submission of specimen other than nasopharyngeal swab, presence of viral mutation(s) within the areas targeted by this assay, and inadequate number of viral copies (<131 copies/mL). A negative result must be combined with clinical observations, patient history, and epidemiological information. The expected result is Negative. Fact Sheet for Patients:  PinkCheek.be Fact Sheet for Healthcare Providers:  GravelBags.it This test is not yet ap proved or cleared by the [place] FDA and  has been authorized for detection and/or diagnosis of SARS-CoV-2 by FDA under an Emergency Use Authorization (EUA). This EUA will remain  in effect (meaning this test can be used) for the duration of the COVID-19 declaration under Section 564(b)(1) of the Act, 21 U.S.C. section 360bbb-3(b)(1), unless the authorization is terminated or revoked sooner.    Influenza A by PCR NEGATIVE NEGATIVE Final   Influenza B by PCR NEGATIVE NEGATIVE Final    Comment: (NOTE) The Xpert Xpress SARS-CoV-2/FLU/RSV assay is intended as an aid  in  the diagnosis of influenza from Nasopharyngeal swab specimens and  should not be used as a sole basis for treatment. Nasal washings and  aspirates are unacceptable for Xpert Xpress SARS-CoV-2/FLU/RSV  testing. Fact Sheet for Patients: PinkCheek.be Fact Sheet for Healthcare Providers: GravelBags.it This test is not yet approved or cleared by the [place] FDA and  has been authorized for detection and/or diagnosis of SARS-CoV-2 by  FDA under an Emergency Use Authorization (EUA). This EUA will remain  in effect (meaning this test can be used) for the duration of the  Covid-19 declaration under Section 564(b)(1) of the Act, 21  U.S.C. section 360bbb-3(b)(1), unless the authorization is  terminated or revoked. Performed at [facility], [place] [address], [place], [place] [address]       Radiology Studies: CT ABDOMEN PELVIS W CONTRAST  Result Date: 03/01/2020 CLINICAL DATA:  Constipation, generalized abdominal pain, nausea, vomiting EXAM: CT ABDOMEN AND PELVIS WITH CONTRAST TECHNIQUE: Multidetector CT imaging of the abdomen and pelvis was performed using the standard protocol following bolus administration of intravenous contrast. CONTRAST:  151mL OMNIPAQUE IOHEXOL 300 MG/ML  SOLN COMPARISON:  None. FINDINGS: Lower chest: No acute abnormality. Hepatobiliary: Mild diffuse low-density throughout the liver compatible with fatty infiltration. 9 mm low-density lesion in the right hepatic lobe, likely small cyst. Gallbladder unremarkable. Pancreas: No focal abnormality or ductal dilatation. Spleen: 1.6 cm low-density lesion in the spleen, likely small cyst or hemangioma. Normal size. Adrenals/Urinary Tract: 3 cm lesion in the right adrenal gland. Low-density lesion within the midpole of the left kidney measures 2 cm. This does not appear cystic. Cyst in the lower pole of the left kidney measures 2 cm. No hydronephrosis. Small cyst in  the upper pole of the right kidney. Urinary bladder unremarkable. Stomach/Bowel: Sigmoid diverticulosis. No active diverticulitis. Bowel decompressed. No evidence of bowel obstruction. Vascular/Lymphatic: 3.4 cm infrarenal abdominal aortic aneurysm. Aortic atherosclerosis. IVC filter in place. No adenopathy. Reproductive: No visible focal abnormality. Other: This No free fluid or free air. Musculoskeletal: Prior right hip replacement. No acute bony abnormality. IMPRESSION: Mild diffuse fatty infiltration of the liver. Small cysts in the kidneys. There is a 2 cm lesion in the midpole of the left kidney which does not appear  cystic. Cannot exclude solid renal lesion. When the patient is clinically stable and able to follow directions and hold their breath (preferably as an outpatient) further evaluation with dedicated abdominal MRI should be considered if felt clinically indicated. 3.4 cm infrarenal abdominal aortic aneurysm. Recommend followup by ultrasound in 3 years. This recommendation follows ACR consensus guidelines: White Paper of the ACR Incidental Findings Committee II on Vascular Findings. J Am Coll Radiol 2013; 10:789-794. Aortic aneurysm NOS (ICD10-I71.9) Nonspecific 3 cm right adrenal lesion. This could also be evaluated with MRI as well. Sigmoid diverticulosis.  No active diverticulitis. Electronically Signed   By: Rolm Baptise M.D.   On: 03/01/2020 17:38      Scheduled Meds: . allopurinol  100 mg Oral Daily  . cholecalciferol  2,000 Units Oral Daily  . citalopram  40 mg Oral Daily  . simvastatin  20 mg Oral QHS   Continuous Infusions: . pantoprozole (PROTONIX) infusion 8 mg/hr (03/03/20 0948)     LOS: 2 days      Time spent: 25 minutes   Dessa Phi, DO [facility] 03/03/2020, 12:16 PM   Available via Epic secure chat 7am-7pm After these hours, please refer to coverage provider listed on [email or web address]

## 2020-03-03 NOTE — Evaluation (Signed)
Physical Therapy Evaluation Patient Details Name: Christopher Santiago MRN: [id number] DOB: 1931-08-17 Today's Date: 03/03/2020   History of Present Illness  86 y.o. male with medical history significant of ascending aortic aneurysm, COPD, anemia, Hx of PE and DVT, PVD presented with constipation, nausea and emesis. PT with one week of constipation followed by 3 episodes of coffee-ground emesis. Pt underwent EGD on 4/10 which revealed 7 small clean based gastric ulcers with no stigmata of bleeding, gastritis, large duodenal bulb ulcer treated with monopolar probe.  Clinical Impression  Pt presents to PT with deficits in functional mobility, gait, balance, endurance, strength, power, and cognition. Pt with dizziness and weakness limiting activity tolerance during session and only able to tolerate very short ambulation distance. Pt is at a high falls risk due to weakness along with transient cognitive deficits and dizzy spells. PT recommending SNF as pt reports he is unsure if family will be able to provide 24/7 assistance for him when he returns home as they have come in from out of town to assist with spouse who was recently hospitalized but is now home. If pt elects to discharge home then pt will need HHPT services.    Follow Up Recommendations SNF;Supervision/Assistance - 24 hour(pt may elect to discharge home with HHPT)    Equipment Recommendations  None recommended by PT    Recommendations for Other Services       Precautions / Restrictions Precautions Precautions: Fall Precaution Comments: dizziness Restrictions Weight Bearing Restrictions: No      Mobility  Bed Mobility Overal bed mobility: Needs Assistance Bed Mobility: Sit to Supine       Sit to supine: Supervision      Transfers Overall transfer level: Needs assistance Equipment used: Rolling walker (2 wheeled) Transfers: Sit to/from Stand Sit to Stand: Min guard            Ambulation/Gait Ambulation/Gait  assistance: Min guard Gait Distance (Feet): 20 Feet Assistive device: Rolling walker (2 wheeled) Gait Pattern/deviations: Step-to pattern Gait velocity: reduced Gait velocity interpretation: <1.8 ft/sec, indicate of risk for recurrent falls General Gait Details: pt with short step to gait limited by dizziness and feeling his legs may give out  Stairs            Wheelchair Mobility    Modified Rankin (Stroke Patients Only)       Balance Overall balance assessment: Needs assistance Sitting-balance support: No upper extremity supported;Feet supported Sitting balance-Leahy Scale: Good Sitting balance - Comments: supervision   Standing balance support: Bilateral upper extremity supported Standing balance-Leahy Scale: Fair Standing balance comment: minG with BUE support of RW                             Pertinent Vitals/Pain Pain Assessment: No/denies pain    Home Living Family/patient expects to be discharged to:: Private residence Living Arrangements: Spouse/significant other Available Help at Discharge: Family;Available 24 hours/day(temporary 24/7? wife recently hospitalized, SIL's and dtr) Type of Home: House Home Access: Stairs to enter Entrance Stairs-Rails: Can reach both Entrance Stairs-Number of Steps: 4 Home Layout: One level;Laundry or work area in [place]: Mining engineer - 2 wheels;Shower seat - built in;Grab bars - tub/shower      Prior Function Level of Independence: Independent with assistive device(s)         Comments: walking with walker, recently walking with cane household and limited community distances     Hand Dominance  Dominant Hand: Right    Extremity/Trunk Assessment   Upper Extremity Assessment Upper Extremity Assessment: Generalized weakness    Lower Extremity Assessment Lower Extremity Assessment: Generalized weakness    Cervical / Trunk Assessment Cervical / Trunk Assessment: Normal   Communication   Communication: Expressive difficulties;HOH(a few instances of word finding difficulties)  Cognition Arousal/Alertness: Awake/alert Behavior During Therapy: WFL for tasks assessed/performed Overall Cognitive Status: No family/caregiver present to determine baseline cognitive functioning                                 General Comments: pt reports having intermittent periods of foggy cognition and has to stop and take time to think about what he is trying to express. PT is alert and oriented.      General Comments General comments (skin integrity, edema, etc.): pt reports dizziness during mobility although BP of 166/64 (93). Pt reports often having dizzy spells even at times when laying or sitting still    Exercises     Assessment/Plan    PT Assessment Patient needs continued PT services  PT Problem List Decreased strength;Decreased activity tolerance;Decreased balance;Decreased mobility;Decreased cognition;Decreased knowledge of use of DME;Decreased safety awareness;Decreased knowledge of precautions       PT Treatment Interventions DME instruction;Gait training;Stair training;Therapeutic activities;Functional mobility training;Therapeutic exercise;Balance training;Neuromuscular re-education;Patient/family education    PT Goals (Current goals can be found in the Care Plan section)  Acute Rehab PT Goals Patient Stated Goal: To improve mobility and get stronger PT Goal Formulation: With patient Time For Goal Achievement: 03/17/20 Potential to Achieve Goals: Good Additional Goals Additional Goal #1: Pt will maintain dynamic standing balance within 10 inches of his base of support with supervision and unilateral UE support of the least restrictive assistive device    Frequency Min 3X/week(until home vs SNF confirmed)   Barriers to discharge        Co-evaluation               AM-PAC PT "6 Clicks" Mobility  Outcome Measure Help needed turning  from your back to your side while in a flat bed without using bedrails?: A Little Help needed moving from lying on your back to sitting on the side of a flat bed without using bedrails?: A Little Help needed moving to and from a bed to a chair (including a wheelchair)?: A Little Help needed standing up from a chair using your arms (e.g., wheelchair or bedside chair)?: A Little Help needed to walk in hospital room?: A Little Help needed climbing 3-5 steps with a railing? : A Lot 6 Click Score: 17    End of Session   Activity Tolerance: Treatment limited secondary to medical complications (Comment)(dizziness, weakness) Patient left: in bed;with call bell/phone within reach;with bed alarm set Nurse Communication: Mobility status PT Visit Diagnosis: Muscle weakness (generalized) (M62.81)    Time: 3785-8850 PT Time Calculation (min) (ACUTE ONLY): 30 min   Charges:   PT Evaluation $PT Eval Moderate Complexity: 1 Mod          Zenaida Niece, PT, DPT Acute Rehabilitation Pager: [phone]   Zenaida Niece 03/03/2020, 5:45 PM

## 2020-03-03 NOTE — Progress Notes (Signed)
Patient ID: Christopher Santiago, male   DOB: November 08, 1931, 86 y.o.   MRN: [id number]    Progress Note   Subjective   Day # 2  CC;  GI bleed   EGD yesterday -started on cratered gastric ulcers with no stigmata of bleeding in the gastric antrum all roughly 3 cm in size mild diffuse gastritis of the entire stomach, 1 cratered duodenal ulcer at the duodenal bulb 10 to 12 mm with question visible vessel this was treated with cautery and epinephrine  Biopsies pending  Hemoglobin 8.7 this a.m. stable  Up in chair, says he feels pretty good, no complaints of abdominal pain or nausea.  Tolerating clear liquids.  No bowel movement since admit    Objective   Vital signs in last 24 hours: Temp:  [97.5 F (36.4 C)-98 F (36.7 C)] 97.6 F (36.4 C) (04/11 0446) Pulse Rate:  [60-80] 67 (04/11 0446) Resp:  [15-23] 15 (04/11 0446) BP: (118-233)/(55-83) 143/61 (04/11 0446) SpO2:  [94 %-99 %] 95 % (04/11 0446) Weight:  [82.1 kg] 82.1 kg (04/10 1438) Last BM Date: 03/01/20 General:    Elderly white male in NAD very pleasant Heart:  Regular rate and rhythm; no murmurs Lungs: Respirations even and unlabored, lungs CTA bilaterally Abdomen:  Soft, nontender and nondistended. Normal bowel sounds. Extremities:  Without edema. Neurologic:  Alert and oriented,  grossly normal neurologically. Psych:  Cooperative. Normal mood and affect.  Intake/Output from previous day: 04/10 0701 - 04/11 0700 In: 722 [P.O.:600; I.V.:122] Out: 1750 [Urine:1750] Intake/Output this shift: Total I/O In: 200 [P.O.:200] Out: -   Lab Results: Recent Labs    03/01/20 2200 03/01/20 2200 03/02/20 0351 03/02/20 0351 03/02/20 1320 03/02/20 2112 03/03/20 0441  WBC 8.0  --  9.4  --   --   --  7.5  HGB 9.1*   < > 8.9*   < > 8.9* 8.2* 8.7*  HCT 29.2*   < > 28.3*   < > 27.7* 26.4* 28.3*  PLT 154  --  142*  --   --   --  150   < > = values in this interval not displayed.   BMET Recent Labs    03/01/20 1500 03/02/20 0351  03/03/20 0441  NA 140 138 140  K 4.7 4.3 4.0  CL 103 107 106  CO2 27 24 25   GLUCOSE 138* 80 80  BUN 62* 57* 28*  CREATININE 1.53* 1.16 1.08  CALCIUM 9.1 9.0 9.1   LFT Recent Labs    03/01/20 1500  PROT 5.9*  ALBUMIN 2.9*  AST 18  ALT 12  ALKPHOS 43  BILITOT 0.7   PT/INR Recent Labs    03/01/20 1801  LABPROT 14.8  INR 1.2    Studies/Results: CT ABDOMEN PELVIS W CONTRAST  Result Date: 03/01/2020 CLINICAL DATA:  Constipation, generalized abdominal pain, nausea, vomiting EXAM: CT ABDOMEN AND PELVIS WITH CONTRAST TECHNIQUE: Multidetector CT imaging of the abdomen and pelvis was performed using the standard protocol following bolus administration of intravenous contrast. CONTRAST:  123mL OMNIPAQUE IOHEXOL 300 MG/ML  SOLN COMPARISON:  None. FINDINGS: Lower chest: No acute abnormality. Hepatobiliary: Mild diffuse low-density throughout the liver compatible with fatty infiltration. 9 mm low-density lesion in the right hepatic lobe, likely small cyst. Gallbladder unremarkable. Pancreas: No focal abnormality or ductal dilatation. Spleen: 1.6 cm low-density lesion in the spleen, likely small cyst or hemangioma. Normal size. Adrenals/Urinary Tract: 3 cm lesion in the right adrenal gland. Low-density lesion within the midpole of  the left kidney measures 2 cm. This does not appear cystic. Cyst in the lower pole of the left kidney measures 2 cm. No hydronephrosis. Small cyst in the upper pole of the right kidney. Urinary bladder unremarkable. Stomach/Bowel: Sigmoid diverticulosis. No active diverticulitis. Bowel decompressed. No evidence of bowel obstruction. Vascular/Lymphatic: 3.4 cm infrarenal abdominal aortic aneurysm. Aortic atherosclerosis. IVC filter in place. No adenopathy. Reproductive: No visible focal abnormality. Other: This No free fluid or free air. Musculoskeletal: Prior right hip replacement. No acute bony abnormality. IMPRESSION: Mild diffuse fatty infiltration of the liver. Small  cysts in the kidneys. There is a 2 cm lesion in the midpole of the left kidney which does not appear cystic. Cannot exclude solid renal lesion. When the patient is clinically stable and able to follow directions and hold their breath (preferably as an outpatient) further evaluation with dedicated abdominal MRI should be considered if felt clinically indicated. 3.4 cm infrarenal abdominal aortic aneurysm. Recommend followup by ultrasound in 3 years. This recommendation follows ACR consensus guidelines: White Paper of the ACR Incidental Findings Committee II on Vascular Findings. J Am Coll Radiol 2013; 10:789-794. Aortic aneurysm NOS (ICD10-I71.9) Nonspecific 3 cm right adrenal lesion. This could also be evaluated with MRI as well. Sigmoid diverticulosis.  No active diverticulitis. Electronically Signed   By: Rolm Baptise M.D.   On: 03/01/2020 17:38       Assessment / Plan:    #78 86 year old white male with acute upper GI bleed with anemia.  EGD revealed 7 large gastric ulcers with no stigmata of active bleeding, and a duodenal bulb ulcer with possible visible vessel which was treated with cautery and epinephrine.  Patient had been on aspirin and scheduled dose indomethacin at home.  Await biopsies, rule out H. Pylori  He has been hemodynamically stable, no further active bleeding and has not required transfusion  Plan; full liquid diet today Continue serial hemoglobins and transfuse for hemoglobin less than 8 due to advanced age Continue PPI infusion for total of 72 hours then transition to oral PPI twice daily  Advised  patient he would need to stop indomethacin altogether and not use any anti-inflammatories.  He can use Tylenol for pain, and follow-up with his PCP if this is not adequate, perhaps trial of tramadol Will be okay to resume baby aspirin as an outpatient  Will need repeat EGD in about 2 months to document healing of numerous gastric ulcers   Active Problems:   Upper GI bleed    Lower GI bleed     LOS: 2 days   Amy Esterwood PA-C 03/03/2020, 11:01 AM

## 2020-03-04 LAB — BASIC METABOLIC PANEL
Anion gap: 10 (ref 5–15)
BUN: 20 mg/dL (ref 8–23)
CO2: 25 mmol/L (ref 22–32)
Calcium: 9 mg/dL (ref 8.9–10.3)
Chloride: 102 mmol/L (ref 98–111)
Creatinine, Ser: 1.09 mg/dL (ref 0.61–1.24)
GFR calc Af Amer: >60 mL/min (ref >60)
GFR calc non Af Amer: >60 mL/min (ref >60)
Glucose, Bld: 93 mg/dL (ref 70–99)
Potassium: 4 mmol/L (ref 3.5–5.1)
Sodium: 137 mmol/L (ref 135–145)

## 2020-03-04 LAB — SARS CORONAVIRUS 2 (TAT 6-24 HRS): SARS Coronavirus 2: NEGATIVE

## 2020-03-04 LAB — HEMOGLOBIN: Hemoglobin: 8.6 g/dL — ABNORMAL LOW (ref 13.0–17.0)

## 2020-03-04 NOTE — TOC Initial Note (Addendum)
Transition of Care [facility]) - Initial/Assessment Note    Patient Details  Name: Christopher Santiago MRN: [id number] Date of Birth: 10-01-1931  Transition of [facility]) CM/SW Contact:    Christopher Favre, RN Phone Number: 03/04/2020, 12:16 PM  Clinical Narrative:                [place] spoke with Christopher Santiago at [facility] they may have a bed available Wednesday. Referral sent Christopher Santiago will review and follow up with NCM.   Christopher Santiago returned call, they can offer bed on Wednesday. Messaged MD regarding when he will be medically ready, still on IV Protonix. MD messaged Wednesday patient will be medically ready. Patient accepted bed. Last covid was 4/9/21per  Christopher Santiago with [facility] he does not need another covid test.     [id number] gave patient bed offers. With patient consent called his wife Christopher Santiago [phone] gave bed offers. Wife will make some phone calls and call NCM back today. Patient and wife have NCM direct phone number.  Patient from home with wife. Already has a walker and cane at home.   Discussed PT recommendation of SNF. Patient agreeable , would like to go to [facility]. Explained usually you have to be a community member to go to SNF at [facility]. Checked with admissions at [facility] , they do not have any available beds.   Patient interested in [place] and surrounding areas, faxed FL2, will provide bed offers once received. Expected Discharge Plan: Skilled Nursing Facility Barriers to Discharge: Continued Medical Work up   Patient Goals and CMS Choice Patient states their goals for this hospitalization and ongoing recovery are:: to get stronger CMS Medicare.gov Compare Post Acute Care list provided to:: Patient Choice offered to / list presented to : Patient  Expected Discharge Plan and Services Expected Discharge Plan: [place]   Discharge Planning Services: CM Consult   Living arrangements for the past 2 months: Single Family Home                  DME Arranged: N/A DME Agency: NA       HH Arranged: NA          Prior Living Arrangements/Services Living arrangements for the past 2 months: Single Family Home Lives with:: Spouse Patient language and need for interpreter reviewed:: Yes Do you feel safe going back to the place where you live?: Yes      Need for Family Participation in Patient Care: Yes (Comment) Care giver support system in place?: Yes (comment) Current home services: DME Criminal Activity/Legal Involvement Pertinent to Current Situation/Hospitalization: No - Comment as needed  Activities of Daily Living Home Assistive Devices/Equipment: Cane (specify quad or straight) ADL Screening (condition at time of admission) Patient's cognitive ability adequate to safely complete daily activities?: Yes Is the patient deaf or have difficulty hearing?: Yes Does the patient have difficulty seeing, even when wearing glasses/contacts?: No Does the patient have difficulty concentrating, remembering, or making decisions?: No Patient able to express need for assistance with ADLs?: No Does the patient have difficulty dressing or bathing?: No Independently performs ADLs?: Yes (appropriate for developmental age) Does the patient have difficulty walking or climbing stairs?: Yes Weakness of Legs: Both Weakness of Arms/Hands: None  Permission Sought/Granted   Permission granted to share information with : No              Emotional Assessment Appearance:: Appears stated age Attitude/Demeanor/Rapport: Engaged Affect (typically  observed): Accepting Orientation: : Oriented to Self, Oriented to Place, Oriented to  Time, Oriented to Situation Alcohol / Substance Use: Not Applicable Psych Involvement: No (comment)  Admission diagnosis:  Lower GI bleed [K92.2] Gastrointestinal hemorrhage, unspecified gastrointestinal hemorrhage type [K92.2] Patient Active Problem List   Diagnosis Date Noted  . Upper GI bleed 03/01/2020   . Lower GI bleed 03/01/2020  . Skin tear of forearm without complication 68/34/1962  . Memory change 07/03/2019  . BPH with urinary obstruction 11/10/2018  . Bronchiectasis ([place]) 08/11/2018  . Anemia 08/11/2018  . Urinary incontinence 05/03/2018  . Urinary retention with incomplete bladder emptying 04/13/2018  . Osteoarthritis of right hip 04/11/2018  . Ascending aorta dilation (HCC) 03/09/2018  . Pulmonary hypertension, unspecified ([place]) 03/09/2018  . Chronic diastolic CHF (congestive heart failure) ([place]) 03/09/2018  . Chronic cough 06/08/2016  . Alcohol use 02/20/2016  . Mixed hyperlipidemia 02/10/2016  . Lower urinary tract symptoms (LUTS) 10/31/2015  . SOB (shortness of breath) 08/26/2015  . Bradycardia 08/26/2015  . Cavitating mass of lung 12/16/2014  . Acute pulmonary embolism ([place]) 11/19/2014  . Laceration of finger 03/15/2013  . Cramps, extremity 03/15/2013  . COPD (chronic obstructive pulmonary disease) ([place]) 12/27/2012  . Biceps tendonitis 07/12/2012  . Medicare annual wellness visit, subsequent 02/04/2012  . Advance directive discussed with patient 02/04/2012  . PVD (peripheral vascular disease) ([place]) 12/22/2011  . CHRONIC RHINITIS 01/29/2011  . CERVICAL STRAIN, ACUTE 04/08/2010  . TESTICULAR HYPOFUNCTION 05/06/2009  . Hypothyroidism 01/17/2009  . CARCINOMA, SKIN, SQUAMOUS CELL 01/08/2009  . Weakness 12/27/2008  . Depression 08/22/2008  . Gout 12/12/2007  . OBESITY 09/24/2007  . Atherosclerosis of native coronary artery of native heart with stable angina pectoris ([place]) 06/16/2007  . ERECTILE DYSFUNCTION 05/07/2007  . OSA on CPAP 05/05/2007  . Essential hypertension 05/05/2007  . Coronary atherosclerosis 05/05/2007  . BPH (benign prostatic hyperplasia) 05/05/2007  . SLEEP APNEA 05/05/2007  . Hyperglycemia 04/05/2007   PCP:  Tonia Ghent, MD Pharmacy:   [place], [place]  [address] Phone: [phone] Fax: [phone]     Social Determinants of Health (SDOH) Interventions    Readmission Risk Interventions No flowsheet data found.

## 2020-03-04 NOTE — Progress Notes (Signed)
Physical Therapy Treatment Patient Details Name: Christopher Santiago MRN: [id number] DOB: Sep 06, 1931 Today's Date: 03/04/2020    History of Present Illness 86 y.o. male with medical history significant of ascending aortic aneurysm, COPD, anemia, Hx of PE and DVT, PVD presented with constipation, nausea and emesis. PT with one week of constipation followed by 3 episodes of coffee-ground emesis. Pt underwent EGD on 4/10 which revealed 7 small clean based gastric ulcers with no stigmata of bleeding, gastritis, large duodenal bulb ulcer treated with monopolar probe.    PT Comments    Pt was seen for gait on RW with slow pace, cued for safety and sat down on chair half way through the effort.  Pt is aware of having limitations, but requires PT to help him set limit parameters as he is not immediately using this awareness in mobility.  Follow acutely for progression of gait as tolerated, monitoring sats and pulses, ck BP as needed.   Follow Up Recommendations  SNF;Supervision/Assistance - 24 hour     Equipment Recommendations  None recommended by PT    Recommendations for Other Services       Precautions / Restrictions Precautions Precautions: Fall Precaution Comments: dizziness Restrictions Weight Bearing Restrictions: No    Mobility  Bed Mobility Overal bed mobility: Needs Assistance Bed Mobility: Supine to Sit;Sit to Supine     Supine to sit: Min assist Sit to supine: Min guard   General bed mobility comments: extra time with minor help  Transfers Overall transfer level: Needs assistance Equipment used: Rolling walker (2 wheeled) Transfers: Sit to/from Stand Sit to Stand: Min assist         General transfer comment: reminding for safety and sequence  Ambulation/Gait Ambulation/Gait assistance: Min guard Gait Distance (Feet): 60 Feet(30 x 2) Assistive device: Rolling walker (2 wheeled) Gait Pattern/deviations: Decreased stride length;Step-to pattern;Wide base of  support;Trunk flexed Gait velocity: reduced Gait velocity interpretation: <1.31 ft/sec, indicative of household ambulator General Gait Details: pt tends to push walker too far ahead and requires close cues for safety   Stairs             Wheelchair Mobility    Modified Rankin (Stroke Patients Only)       Balance Overall balance assessment: Needs assistance Sitting-balance support: Feet supported Sitting balance-Leahy Scale: Good     Standing balance support: Bilateral upper extremity supported;No upper extremity supported;During functional activity Standing balance-Leahy Scale: Fair Standing balance comment: less than fair dynamic balance                            Cognition Arousal/Alertness: Awake/alert Behavior During Therapy: WFL for tasks assessed/performed Overall Cognitive Status: Impaired/Different from baseline Area of Impairment: Memory;Following commands;Safety/judgement;Awareness;Problem solving                     Memory: Decreased short-term memory Following Commands: Follows one step commands inconsistently;Follows one step commands with increased time Safety/Judgement: Decreased awareness of safety;Decreased awareness of deficits Awareness: Intellectual Problem Solving: Slow processing;Requires verbal cues        Exercises      General Comments General comments (skin integrity, edema, etc.): no dizziness but fatigued with standing and walking      Pertinent Vitals/Pain Pain Assessment: Faces Faces Pain Scale: Hurts even more Pain Location: back (chronic) Pain Descriptors / Indicators: Discomfort;Grimacing Pain Intervention(s): Monitored during session;Premedicated before session;Repositioned    Home Living  Prior Function            PT Goals (current goals can now be found in the care plan section) Progress towards PT goals: Progressing toward goals    Frequency    Min  3X/week      PT Plan Current plan remains appropriate    Co-evaluation              AM-PAC PT "6 Clicks" Mobility   Outcome Measure  Help needed turning from your back to your side while in a flat bed without using bedrails?: A Little Help needed moving from lying on your back to sitting on the side of a flat bed without using bedrails?: A Little Help needed moving to and from a bed to a chair (including a wheelchair)?: A Little Help needed standing up from a chair using your arms (e.g., wheelchair or bedside chair)?: A Little Help needed to walk in hospital room?: A Little Help needed climbing 3-5 steps with a railing? : A Lot 6 Click Score: 17    End of Session Equipment Utilized During Treatment: Gait belt Activity Tolerance: Treatment limited secondary to medical complications (Comment) Patient left: in bed;with call bell/phone within reach;with bed alarm set Nurse Communication: Mobility status PT Visit Diagnosis: Muscle weakness (generalized) (M62.81)     Time: 4585-9292 PT Time Calculation (min) (ACUTE ONLY): 26 min  Charges:  $Gait Training: 8-22 mins $Therapeutic Activity: 8-22 mins                  Ramond Dial 03/04/2020, 5:55 PM  Mee Hives, PT MS Acute Rehab Dept. Number: [employer] and [place]

## 2020-03-04 NOTE — Care Management Important Message (Signed)
Important Message  Patient Details  Name: Christopher Santiago MRN: [id number] Date of Birth: August 03, 1931   Medicare Important Message Given:  Yes     Orbie Pyo 03/04/2020, 3:32 PM

## 2020-03-04 NOTE — NC FL2 (Signed)
[place] LEVEL OF CARE SCREENING TOOL     IDENTIFICATION  Patient Name: Christopher Santiago Birthdate: [birth year] Sex: male Admission Date (Current Location): 03/01/2020  [facility] and [place] Number:  Herbalist and Address:  [facility] [place], [place] [address] [place], [place] [address]      Provider Number: [id number]  Attending Physician Name and Address:  Dessa Phi, DO  Relative Name and Phone Number:  Roben Schliep wife [phone]    Current Level of Care: Hospital Recommended Level of Care: [place] Prior Approval Number:    Date Approved/Denied:   PASRR Number: [id number]  Discharge Plan: SNF    Current Diagnoses: Patient Active Problem List   Diagnosis Date Noted  . Upper GI bleed 03/01/2020  . Lower GI bleed 03/01/2020  . Skin tear of forearm without complication 02/63/7858  . Memory change 07/03/2019  . BPH with urinary obstruction 11/10/2018  . Bronchiectasis ([place]) 08/11/2018  . Anemia 08/11/2018  . Urinary incontinence 05/03/2018  . Urinary retention with incomplete bladder emptying 04/13/2018  . Osteoarthritis of right hip 04/11/2018  . Ascending aorta dilation (HCC) 03/09/2018  . Pulmonary hypertension, unspecified ([place]) 03/09/2018  . Chronic diastolic CHF (congestive heart failure) ([place]) 03/09/2018  . Chronic cough 06/08/2016  . Alcohol use 02/20/2016  . Mixed hyperlipidemia 02/10/2016  . Lower urinary tract symptoms (LUTS) 10/31/2015  . SOB (shortness of breath) 08/26/2015  . Bradycardia 08/26/2015  . Cavitating mass of lung 12/16/2014  . Acute pulmonary embolism ([place]) 11/19/2014  . Laceration of finger 03/15/2013  . Cramps, extremity 03/15/2013  . COPD (chronic obstructive pulmonary disease) ([place]) 12/27/2012  . Biceps tendonitis 07/12/2012  . Medicare annual wellness visit, subsequent 02/04/2012  . Advance directive discussed with patient 02/04/2012  . PVD (peripheral vascular  disease) ([place]) 12/22/2011  . CHRONIC RHINITIS 01/29/2011  . CERVICAL STRAIN, ACUTE 04/08/2010  . TESTICULAR HYPOFUNCTION 05/06/2009  . Hypothyroidism 01/17/2009  . CARCINOMA, SKIN, SQUAMOUS CELL 01/08/2009  . Weakness 12/27/2008  . Depression 08/22/2008  . Gout 12/12/2007  . OBESITY 09/24/2007  . Atherosclerosis of native coronary artery of native heart with stable angina pectoris ([place]) 06/16/2007  . ERECTILE DYSFUNCTION 05/07/2007  . OSA on CPAP 05/05/2007  . Essential hypertension 05/05/2007  . Coronary atherosclerosis 05/05/2007  . BPH (benign prostatic hyperplasia) 05/05/2007  . SLEEP APNEA 05/05/2007  . Hyperglycemia 04/05/2007    Orientation RESPIRATION BLADDER Height & Weight     Self, Time, Situation, Place  Normal Continent Weight: 82.1 kg Height:  5\' 9"  (175.3 cm)  BEHAVIORAL SYMPTOMS/MOOD NEUROLOGICAL BOWEL NUTRITION STATUS      Continent Diet  AMBULATORY STATUS COMMUNICATION OF NEEDS Skin   Limited Assist Verbally Normal                       Personal Care Assistance Level of Assistance  Bathing, Feeding, Dressing Bathing Assistance: Limited assistance Feeding assistance: Limited assistance Dressing Assistance: Limited assistance     Functional Limitations Info  Hearing   Hearing Info: Adequate      SPECIAL CARE FACTORS FREQUENCY  PT (By licensed PT), OT (By licensed OT)     PT Frequency: five times a week OT Frequency: five times a week            Contractures Contractures Info: Not present    Additional Factors Info  Code Status, Allergies Code Status Info: full Allergies Info: Tessalon Benzonatate  Current Medications (03/04/2020):  This is the current hospital active medication list Current Facility-Administered Medications  Medication Dose Route Frequency Provider Last Rate Last Admin  . acetaminophen (TYLENOL) tablet 650 mg  650 mg Oral Q6H PRN Wynetta Fines T, MD   650 mg at 03/03/20 1013   Or  . acetaminophen  (TYLENOL) suppository 650 mg  650 mg Rectal Q6H PRN Wynetta Fines T, MD      . albuterol (PROVENTIL) (2.5 MG/3ML) 0.083% nebulizer solution 2.5 mg  2.5 mg Inhalation Q6H PRN Wynetta Fines T, MD      . allopurinol (ZYLOPRIM) tablet 100 mg  100 mg Oral Daily Wynetta Fines T, MD   100 mg at 03/04/20 1012  . cholecalciferol (VITAMIN D3) tablet 2,000 Units  2,000 Units Oral Daily Lequita Halt, MD   2,000 Units at 03/04/20 1012  . citalopram (CELEXA) tablet 40 mg  40 mg Oral Daily Wynetta Fines T, MD   40 mg at 03/04/20 1012  . pantoprazole (PROTONIX) 80 mg in sodium chloride 0.9 % 100 mL (0.8 mg/mL) infusion  8 mg/hr Intravenous Continuous Dessa Phi, DO 10 mL/hr at 03/04/20 0442 8 mg/hr at 03/04/20 0442  . simvastatin (ZOCOR) tablet 20 mg  20 mg Oral QHS Lequita Halt, MD   20 mg at 03/03/20 2125     Discharge Medications: Please see discharge summary for a list of discharge medications.  Relevant Imaging Results:  Relevant Lab Results:   Additional Information SSI [id number]  Marilu Favre, RN

## 2020-03-04 NOTE — Progress Notes (Signed)
PROGRESS NOTE    Christopher Santiago  TWS:[id number] DOB: May 30, 1931 DOA: 03/01/2020 PCP: Tonia Ghent, MD     Brief Narrative:  Christopher Santiago is a 86 y.o. male with medical history significant of ascending aortic aneurysm,COPD, anemia, Hx of PE and DVT,PVD presented with constipation, nausea and emesis.Usually, patient moves his bowels once in 2-3 days, but starting about two weeks ago, his bowel habit changed, with severe constipation for about one week. He has been drinking a laxative tea also tried to manually disimpact himself and with only a small amount of stool removed. Almost same time, he also had on and off abd pain, sharp like, peri-umbilical.Today he had sudden onset of nausea and vomiting. He has had 3 episodes of coffee-ground emesis however patient admits to drinking coffee just prior to vomiting.After vomiting he reports feeling much better. He drinks alcohol daily admitting to 4 beers x 70 years and also take ibuprofen for joints pain when needed, no recent increase in NSAID use.Patient is not anticoagulated, onlytakes aspirin daily.   Patient admitted with concerns for GI bleed.  GI was consulted. Patient underwent EGD 4/10 which revealed 7 small clean based gastric ulcers with no stigmata of bleeding, gastritis, large duodenal bulb ulcer treated with monopolar probe.   New events last 24 hours / Subjective: Tolerating diet. No N/V. Feeling well overall. Agreeable for SNF placement.   Assessment & Plan:   Active Problems:   Upper GI bleed   Lower GI bleed   GI bleed -GI consulted; signed off 4/11  -History of alcohol use, daily baby aspirin -hold aspirin, resume as outpatient -Stop indomethacin  -FOBT positive -CT abdomen pelvis found sigmoid diverticulosis  -S/p EGD 4/10 which revealed 7 small clean based gastric ulcers with no stigmata of bleeding, gastritis, large duodenal bulb ulcer treated with monopolar probe. Will need repeat EGD in 2 months.  -IV PPI  for 72 hours then PO BID for 6 weeks, then daily afterward -Await biopsies, rule out H Pylori  -Trend H&H  CKD stage IIIa -Baseline creatinine 1-1.5  -Stable   HLD -Continue zocor    Gout -Allopurinol. Stop indomethacin  Depression/anxiety -Continue celexa   Fatty liver -Seen on CT A/P   Left kidney 2 cm lesion Nonspecific 3 cm right adrenal lesion -Follow up outpatient abdominal MRI   Infrarenal AAA -3.4cm. Follow up with [place] in 3 years    DVT prophylaxis: SCD Code Status: Full code Family Communication: No family at bedside Disposition Plan:  Status is: Inpatient  Remains inpatient appropriate because:IV treatments appropriate due to intensity of illness or inability to take PO   Dispo: The patient is from: Home              Anticipated d/c is to: SNF              Anticipated d/c date is: 1 day              Patient currently is not medically stable to d/c. Remains on IV PPI.    Consultants:   GI  Procedures:   None  Antimicrobials:  Anti-infectives (From admission, onward)   None       Objective: Vitals:   03/03/20 0446 03/03/20 1727 03/03/20 2017 03/04/20 0427  BP: (!) 143/61 (!) 143/56 (!) 137/58 (!) 135/46  Pulse: 67 63 68 64  Resp: 15  18 18   Temp: 97.6 F (36.4 C) 97.9 F (36.6 C) 98.2 F (36.8 C) 98.6 F (37 C)  TempSrc: Oral Oral Oral Oral  SpO2: 95% 100% 96% 95%  Weight:      Height:        Intake/Output Summary (Last 24 hours) at 03/04/2020 1053 Last data filed at 03/04/2020 1029 Gross per 24 hour  Intake 927.57 ml  Output 840 ml  Net 87.57 ml   Filed Weights   03/01/20 1427 03/01/20 2022 03/02/20 1438  Weight: 83.9 kg 82.1 kg 82.1 kg    Examination: General exam: Appears calm and comfortable  Respiratory system: Clear to auscultation. Respiratory effort normal. Cardiovascular system: S1 & S2 heard, RRR. No pedal edema. Gastrointestinal system: Abdomen is nondistended, soft and nontender. Normal bowel sounds heard.  Central nervous system: Alert and oriented. Non focal exam. Speech clear  Extremities: Symmetric in appearance bilaterally  Skin: No rashes, lesions or ulcers on exposed skin  Psychiatry: Judgement and insight appear stable. Mood & affect appropriate.    Data Reviewed: I have personally reviewed following labs and imaging studies  CBC: Recent Labs  Lab 03/01/20 1500 03/01/20 1500 03/01/20 2200 03/01/20 2200 03/02/20 0351 03/02/20 0351 03/02/20 1320 03/02/20 2112 03/03/20 0441 03/03/20 1227 03/04/20 0254  WBC 10.4  --  8.0  --  9.4  --   --   --  7.5  --   --   NEUTROABS 8.6*  --   --   --   --   --   --   --   --   --   --   HGB 9.5*   < > 9.1*   < > 8.9*   < > 8.9* 8.2* 8.7* 8.2* 8.6*  HCT 30.6*   < > 29.2*   < > 28.3*  --  27.7* 26.4* 28.3* 26.7*  --   MCV 97.8  --  98.0  --  97.6  --   --   --  98.3  --   --   PLT 162  --  154  --  142*  --   --   --  150  --   --    < > = values in this interval not displayed.   Basic Metabolic Panel: Recent Labs  Lab 03/01/20 1500 03/02/20 0351 03/03/20 0441 03/04/20 0254  NA 140 138 140 137  K 4.7 4.3 4.0 4.0  CL 103 107 106 102  CO2 27 24 25 25   GLUCOSE 138* 80 80 93  BUN 62* 57* 28* 20  CREATININE 1.53* 1.16 1.08 1.09  CALCIUM 9.1 9.0 9.1 9.0   GFR: Estimated Creatinine Clearance: 47.7 mL/min (by C-G formula based on SCr of 1.09 mg/dL). Liver Function Tests: Recent Labs  Lab 03/01/20 1500  AST 18  ALT 12  ALKPHOS 43  BILITOT 0.7  PROT 5.9*  ALBUMIN 2.9*   Recent Labs  Lab 03/01/20 1500  LIPASE 34   No results for input(s): AMMONIA in the last 168 hours. Coagulation Profile: Recent Labs  Lab 03/01/20 1801  INR 1.2   Cardiac Enzymes: No results for input(s): CKTOTAL, CKMB, CKMBINDEX, TROPONINI in the last 168 hours. BNP (last 3 results) Recent Labs    06/29/19 0858  PROBNP 246.0*   HbA1C: No results for input(s): HGBA1C in the last 72 hours. CBG: No results for input(s): GLUCAP in the last 168  hours. Lipid Profile: No results for input(s): CHOL, HDL, LDLCALC, TRIG, CHOLHDL, LDLDIRECT in the last 72 hours. Thyroid Function Tests: No results for input(s): TSH, T4TOTAL, FREET4, T3FREE, THYROIDAB in the last 72  hours. Anemia Panel: No results for input(s): VITAMINB12, FOLATE, FERRITIN, TIBC, IRON, RETICCTPCT in the last 72 hours. Sepsis Labs: No results for input(s): PROCALCITON, LATICACIDVEN in the last 168 hours.  Recent Results (from the past 240 hour(s))  Respiratory Panel by RT PCR (Flu A&B, Covid) - Nasopharyngeal Swab     Status: None   Collection Time: 03/01/20  2:44 PM   Specimen: Nasopharyngeal Swab  Result Value Ref Range Status   SARS Coronavirus 2 by RT PCR NEGATIVE NEGATIVE Final    Comment: (NOTE) SARS-CoV-2 target nucleic acids are NOT DETECTED. The SARS-CoV-2 RNA is generally detectable in upper respiratoy specimens during the acute phase of infection. The lowest concentration of SARS-CoV-2 viral copies this assay can detect is 131 copies/mL. A negative result does not preclude SARS-Cov-2 infection and should not be used as the sole basis for treatment or other patient management decisions. A negative result may occur with  improper specimen collection/handling, submission of specimen other than nasopharyngeal swab, presence of viral mutation(s) within the areas targeted by this assay, and inadequate number of viral copies (<131 copies/mL). A negative result must be combined with clinical observations, patient history, and epidemiological information. The expected result is Negative. Fact Sheet for Patients:  PinkCheek.be Fact Sheet for Healthcare Providers:  GravelBags.it This test is not yet ap proved or cleared by the [place] FDA and  has been authorized for detection and/or diagnosis of SARS-CoV-2 by FDA under an Emergency Use Authorization (EUA). This EUA will remain  in effect (meaning  this test can be used) for the duration of the COVID-19 declaration under Section 564(b)(1) of the Act, 21 U.S.C. section 360bbb-3(b)(1), unless the authorization is terminated or revoked sooner.    Influenza A by PCR NEGATIVE NEGATIVE Final   Influenza B by PCR NEGATIVE NEGATIVE Final    Comment: (NOTE) The Xpert Xpress SARS-CoV-2/FLU/RSV assay is intended as an aid in  the diagnosis of influenza from Nasopharyngeal swab specimens and  should not be used as a sole basis for treatment. Nasal washings and  aspirates are unacceptable for Xpert Xpress SARS-CoV-2/FLU/RSV  testing. Fact Sheet for Patients: PinkCheek.be Fact Sheet for Healthcare Providers: GravelBags.it This test is not yet approved or cleared by the [place] FDA and  has been authorized for detection and/or diagnosis of SARS-CoV-2 by  FDA under an Emergency Use Authorization (EUA). This EUA will remain  in effect (meaning this test can be used) for the duration of the  Covid-19 declaration under Section 564(b)(1) of the Act, 21  U.S.C. section 360bbb-3(b)(1), unless the authorization is  terminated or revoked. Performed at [facility], [place] [address] [place], [place] [address]       Radiology Studies: No results found.    Scheduled Meds: . allopurinol  100 mg Oral Daily  . cholecalciferol  2,000 Units Oral Daily  . citalopram  40 mg Oral Daily  . simvastatin  20 mg Oral QHS   Continuous Infusions: . pantoprozole (PROTONIX) infusion 8 mg/hr (03/04/20 0442)     LOS: 3 days      Time spent: 25 minutes   Dessa Phi, DO [facility] 03/04/2020, 10:53 AM   Available via Epic secure chat 7am-7pm After these hours, please refer to coverage provider listed on [email or web address]

## 2020-03-04 NOTE — Evaluation (Signed)
Occupational Therapy Evaluation Patient Details Name: Christopher Santiago MRN: [id number] DOB: 07-29-1931 Today's Date: 03/04/2020    History of Present Illness 86 y.o. male with medical history significant of ascending aortic aneurysm, COPD, anemia, Hx of PE and DVT, PVD presented with constipation, nausea and emesis. PT with one week of constipation followed by 3 episodes of coffee-ground emesis. Pt underwent EGD on 4/10 which revealed 7 small clean based gastric ulcers with no stigmata of bleeding, gastritis, large duodenal bulb ulcer treated with monopolar probe.   Clinical Impression   PTA patient independent and driving. Admitted for above and limited by problem list below including, impaired balance, decreased activity tolerance, generalized weakness and dizziness. He demonstrates ability to complete bed mobility with supervision, transfers with min guard assist using RW and ADLs with min to mod assist.  He requires min cueing for recall, problem solving during session.  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     Follow Up Recommendations  SNF;Supervision/Assistance - 24 hour    Equipment Recommendations  3 in 1 bedside commode    Recommendations for Other Services       Precautions / Restrictions Precautions Precautions: Fall Precaution Comments: dizziness Restrictions Weight Bearing Restrictions: No      Mobility Bed Mobility Overal bed mobility: Needs Assistance Bed Mobility: Sit to Supine       Sit to supine: Supervision   General bed mobility comments: increased time, HOB elevated but no physical assist required  Transfers Overall transfer level: Needs assistance Equipment used: Rolling walker (2 wheeled) Transfers: Sit to/from Stand Sit to Stand: Min guard          General transfer comment: cueing for hand placement and safety, using RW     Balance Overall balance assessment: Needs assistance Sitting-balance support: No upper extremity supported;Feet supported Sitting balance-Leahy Scale: Good     Standing balance support: Bilateral upper extremity supported;No upper extremity supported;During functional activity Standing balance-Leahy Scale: Fair Standing balance comment: min guard with BUE support on RW                            ADL either performed or assessed with clinical judgement   ADL Overall ADL's : Needs assistance/impaired     Grooming: Set up;Sitting   Upper Body Bathing: Set up;Sitting   Lower Body Bathing: Moderate assistance;Sit to/from stand   Upper Body Dressing : Minimal assistance;Sitting   Lower Body Dressing: Moderate assistance;Sit to/from stand Lower Body Dressing Details (indicate cue type and reason): requires assist to adjust socks, min guard sit to stand  Toilet Transfer: Min guard;Ambulation;RW Toilet Transfer Details (indicate cue type and reason): simulated to recliner  Toileting- Clothing Manipulation and Hygiene: Min guard;Sit to/from stand Toileting - Clothing Manipulation Details (indicate cue type and reason): using urinal      Functional mobility during ADLs: Min guard;Rolling walker General ADL Comments: pt limited by generalized weakness, decreased activity tolerance, impaired balance and dizziness      Vision   Vision Assessment?: No apparent visual deficits     Perception     Praxis      Pertinent Vitals/Pain Pain Assessment: Faces Faces Pain Scale: Hurts little more Pain Location: back (chronic) Pain Descriptors / Indicators: Discomfort;Grimacing Pain  Intervention(s): Limited activity within patient's tolerance;Monitored during session;Repositioned     Hand Dominance Right   Extremity/Trunk Assessment Upper Extremity Assessment Upper Extremity Assessment:  Generalized weakness   Lower Extremity Assessment Lower Extremity Assessment: Defer to PT evaluation   Cervical / Trunk Assessment Cervical / Trunk Assessment: Normal   Communication Communication Communication: HOH   Cognition Arousal/Alertness: Awake/alert Behavior During Therapy: WFL for tasks assessed/performed Overall Cognitive Status: Impaired/Different from baseline Area of Impairment: Memory;Problem solving                     Memory: Decreased short-term memory       Problem Solving: Slow processing;Requires verbal cues General Comments: pt with slow processing and requires verbal cueing for problem sovling; decreased STM noted    General Comments  reports dizziness with standing, improves with sitting today     Exercises     Shoulder Instructions      Home Living Family/patient expects to be discharged to:: Private residence Living Arrangements: Spouse/significant other Available Help at Discharge: Family;Available 24 hours/day(temporary 24/7? wife recently hospitalized and SIL is helpin) Type of Home: House Home Access: Stairs to enter [employer] of Steps: 4 Entrance Stairs-Rails: Can reach both Home Layout: One level;Laundry or work area in basement     [employer] Shower/Tub: Occupational psychologist: Handicapped height Bathroom Accessibility: Yes   Home Equipment: Mining engineer - 2 wheels;Shower seat - built in;Grab bars - tub/shower          Prior Functioning/Environment Level of Independence: Independent with assistive device(s)        Comments: walking with walker, recently walking with cane household and limited community distances; independent ADLs, driving and IADLs         OT Problem List: Decreased strength;Decreased activity tolerance;Impaired balance (sitting and/or standing);Decreased safety awareness;Decreased cognition;Decreased knowledge of use of DME or AE;Decreased knowledge of precautions       OT Treatment/Interventions: Self-care/ADL training;DME and/or AE instruction;Therapeutic activities;Balance training;Patient/family education;Cognitive remediation/compensation;Therapeutic exercise;Energy conservation    OT Goals(Current goals can be found in the care plan section) Acute Rehab OT Goals Patient Stated Goal: to get to rehab before going home  OT Goal Formulation: With patient Time For Goal Achievement: 03/18/20 Potential to Achieve Goals: Good  OT Frequency: Min 2X/week   Barriers to D/C:            Co-evaluation              AM-PAC OT "6 Clicks" Daily Activity     Outcome Measure Help from another person eating meals?: None Help from another person taking care of personal grooming?: A Little Help from another person toileting, which includes using toliet, bedpan, or urinal?: A Little Help from another person bathing (including washing, rinsing, drying)?: A Lot Help from another person to put on and taking off regular upper body clothing?: A Little Help from another person to put on and taking off regular lower body clothing?: A Lot 6 Click Score: 17   End of Session Equipment Utilized During Treatment: Gait belt;Rolling walker Nurse Communication: Mobility status  Activity Tolerance: Patient tolerated treatment well Patient left: in chair;with call bell/phone within reach  OT Visit Diagnosis: Other abnormalities of gait and mobility (R26.89);Muscle weakness (generalized) (M62.81)                Time: 3235-5732 OT Time Calculation (min): 23 min Charges:  OT General Charges $OT Visit: 1 Visit OT Evaluation $OT Eval Moderate Complexity:  1 Mod OT Treatments $Self Care/Home Management : 8-22 mins  Jolaine Artist, OT Acute Rehabilitation Services Pager [phone] Office [phone]    Delight Stare 03/04/2020, 9:18 AM

## 2020-03-05 ENCOUNTER — Other Ambulatory Visit: Payer: Self-pay | Admitting: Physician Assistant

## 2020-03-05 LAB — SURGICAL PATHOLOGY

## 2020-03-05 LAB — HEMOGLOBIN AND HEMATOCRIT, BLOOD
HCT: 27.6 % — ABNORMAL LOW (ref 39.0–52.0)
Hemoglobin: 8.6 g/dL — ABNORMAL LOW (ref 13.0–17.0)

## 2020-03-05 MED ORDER — ONDANSETRON HCL 4 MG/2ML IJ SOLN
4.0000 mg | Freq: Four times a day (QID) | INTRAMUSCULAR | Status: DC | PRN
  Administered 2020-03-05: 21:00:00 4 mg via INTRAVENOUS
  Filled 2020-03-05: qty 2

## 2020-03-05 MED ORDER — PANTOPRAZOLE SODIUM 40 MG PO TBEC
40.0000 mg | DELAYED_RELEASE_TABLET | Freq: Two times a day (BID) | ORAL | Status: DC
  Administered 2020-03-06: 09:00:00 40 mg via ORAL
  Filled 2020-03-05: qty 1

## 2020-03-05 NOTE — Progress Notes (Signed)
PROGRESS NOTE    Christopher Santiago  EXH:[id number] DOB: 09-09-1931 DOA: 03/01/2020 PCP: Tonia Ghent, MD     Brief Narrative:  Christopher Santiago is a 86 y.o. male with medical history significant of ascending aortic aneurysm,COPD, anemia, Hx of PE and DVT,PVD presented with constipation, nausea and emesis.Usually, patient moves his bowels once in 2-3 days, but starting about two weeks ago, his bowel habit changed, with severe constipation for about one week. He has been drinking a laxative tea also tried to manually disimpact himself and with only a small amount of stool removed. Almost same time, he also had on and off abd pain, sharp like, peri-umbilical.Today he had sudden onset of nausea and vomiting. He has had 3 episodes of coffee-ground emesis however patient admits to drinking coffee just prior to vomiting.After vomiting he reports feeling much better. He drinks alcohol daily admitting to 4 beers x 70 years and also take ibuprofen for joints pain when needed, no recent increase in NSAID use.Patient is not anticoagulated, onlytakes aspirin daily.   Patient admitted with concerns for GI bleed.  GI was consulted. Patient underwent EGD 4/10 which revealed 7 small clean based gastric ulcers with no stigmata of bleeding, gastritis, large duodenal bulb ulcer treated with monopolar probe.   New events last 24 hours / Subjective: Feeling well without complaints. Tolerating diet without vomiting. Has not had BM.   Assessment & Plan:   Active Problems:   Upper GI bleed   Lower GI bleed   GI bleed -GI consulted; signed off 4/11  -History of alcohol use, daily baby aspirin -hold aspirin, resume as outpatient  -Stop indomethacin  -FOBT positive -CT abdomen pelvis found sigmoid diverticulosis  -S/p EGD 4/10 which revealed 7 small clean based gastric ulcers with no stigmata of bleeding, gastritis, large duodenal bulb ulcer treated with monopolar probe. Will need repeat EGD in 2 months.   -Biopsy revealed chronic active gastritis, negative for H pylori, dysplasia or malignancy  -IV PPI for 72 hours (end time 9am 4/14) then PO BID for 6 weeks, then daily afterward -Trend H&H, remains stable   CKD stage IIIa -Baseline creatinine 1-1.5  -Stable   HLD -Continue zocor    Gout -Allopurinol. Stop indomethacin  Depression/anxiety -Continue celexa   Fatty liver -Seen on CT A/P   Left kidney 2 cm lesion Nonspecific 3 cm right adrenal lesion -Follow up outpatient abdominal MRI   Infrarenal AAA -3.4cm. Follow up with [place] in 3 years    DVT prophylaxis: SCD Code Status: Full code Family Communication: No family at bedside Disposition Plan:  Status is: Inpatient  Remains inpatient appropriate because:IV treatments appropriate due to intensity of illness or inability to take PO   Dispo: The patient is from: Home              Anticipated d/c is to: SNF. Repeat COVID negative.               Anticipated d/c date is: 1 day              Patient currently is not medically stable to d/c. Remains on IV PPI through 4/14 AM.    Consultants:   GI  Procedures:   EGD   Antimicrobials:  Anti-infectives (From admission, onward)   None       Objective: Vitals:   03/04/20 0427 03/04/20 1353 03/04/20 2232 03/05/20 0524  BP: (!) 135/46 (!) 162/67 (!) 144/65 (!) 146/68  Pulse: 64 90 85 73  Resp: 18 17 18 16   Temp: 98.6 F (37 C) 98.6 F (37 C) 99.4 F (37.4 C) 97.8 F (36.6 C)  TempSrc: Oral Oral Oral Oral  SpO2: 95% 94% 97% 93%  Weight:      Height:        Intake/Output Summary (Last 24 hours) at 03/05/2020 1048 Last data filed at 03/05/2020 0911 Gross per 24 hour  Intake 418.62 ml  Output 790 ml  Net -371.38 ml   Filed Weights   03/01/20 1427 03/01/20 2022 03/02/20 1438  Weight: 83.9 kg 82.1 kg 82.1 kg    Examination: General exam: Appears calm and comfortable  Respiratory system: Clear to auscultation. Respiratory effort normal. Cardiovascular  system: S1 & S2 heard, RRR. No pedal edema. Gastrointestinal system: Abdomen is nondistended, soft and nontender. Normal bowel sounds heard. Central nervous system: Alert and oriented. Non focal exam. Speech clear  Extremities: Symmetric in appearance bilaterally  Skin: No rashes, lesions or ulcers on exposed skin  Psychiatry: Judgement and insight appear stable. Mood & affect appropriate.    Data Reviewed: I have personally reviewed following labs and imaging studies  CBC: Recent Labs  Lab 03/01/20 1500 03/01/20 1500 03/01/20 2200 03/01/20 2200 03/02/20 0351 03/02/20 0351 03/02/20 1320 03/02/20 1320 03/02/20 2112 03/03/20 0441 03/03/20 1227 03/04/20 0254 03/05/20 0210  WBC 10.4  --  8.0  --  9.4  --   --   --   --  7.5  --   --   --   NEUTROABS 8.6*  --   --   --   --   --   --   --   --   --   --   --   --   HGB 9.5*   < > 9.1*   < > 8.9*   < > 8.9*   < > 8.2* 8.7* 8.2* 8.6* 8.6*  HCT 30.6*   < > 29.2*   < > 28.3*   < > 27.7*  --  26.4* 28.3* 26.7*  --  27.6*  MCV 97.8  --  98.0  --  97.6  --   --   --   --  98.3  --   --   --   PLT 162  --  154  --  142*  --   --   --   --  150  --   --   --    < > = values in this interval not displayed.   Basic Metabolic Panel: Recent Labs  Lab 03/01/20 1500 03/02/20 0351 03/03/20 0441 03/04/20 0254  NA 140 138 140 137  K 4.7 4.3 4.0 4.0  CL 103 107 106 102  CO2 27 24 25 25   GLUCOSE 138* 80 80 93  BUN 62* 57* 28* 20  CREATININE 1.53* 1.16 1.08 1.09  CALCIUM 9.1 9.0 9.1 9.0   GFR: Estimated Creatinine Clearance: 47.7 mL/min (by C-G formula based on SCr of 1.09 mg/dL). Liver Function Tests: Recent Labs  Lab 03/01/20 1500  AST 18  ALT 12  ALKPHOS 43  BILITOT 0.7  PROT 5.9*  ALBUMIN 2.9*   Recent Labs  Lab 03/01/20 1500  LIPASE 34   No results for input(s): AMMONIA in the last 168 hours. Coagulation Profile: Recent Labs  Lab 03/01/20 1801  INR 1.2   Cardiac Enzymes: No results for input(s): CKTOTAL, CKMB,  CKMBINDEX, TROPONINI in the last 168 hours. BNP (last 3 results) Recent Labs  06/29/19 0858  PROBNP 246.0*   HbA1C: No results for input(s): HGBA1C in the last 72 hours. CBG: No results for input(s): GLUCAP in the last 168 hours. Lipid Profile: No results for input(s): CHOL, HDL, LDLCALC, TRIG, CHOLHDL, LDLDIRECT in the last 72 hours. Thyroid Function Tests: No results for input(s): TSH, T4TOTAL, FREET4, T3FREE, THYROIDAB in the last 72 hours. Anemia Panel: No results for input(s): VITAMINB12, FOLATE, FERRITIN, TIBC, IRON, RETICCTPCT in the last 72 hours. Sepsis Labs: No results for input(s): PROCALCITON, LATICACIDVEN in the last 168 hours.  Recent Results (from the past 240 hour(s))  Respiratory Panel by RT PCR (Flu A&B, Covid) - Nasopharyngeal Swab     Status: None   Collection Time: 03/01/20  2:44 PM   Specimen: Nasopharyngeal Swab  Result Value Ref Range Status   SARS Coronavirus 2 by RT PCR NEGATIVE NEGATIVE Final    Comment: (NOTE) SARS-CoV-2 target nucleic acids are NOT DETECTED. The SARS-CoV-2 RNA is generally detectable in upper respiratoy specimens during the acute phase of infection. The lowest concentration of SARS-CoV-2 viral copies this assay can detect is 131 copies/mL. A negative result does not preclude SARS-Cov-2 infection and should not be used as the sole basis for treatment or other patient management decisions. A negative result may occur with  improper specimen collection/handling, submission of specimen other than nasopharyngeal swab, presence of viral mutation(s) within the areas targeted by this assay, and inadequate number of viral copies (<131 copies/mL). A negative result must be combined with clinical observations, patient history, and epidemiological information. The expected result is Negative. Fact Sheet for Patients:  PinkCheek.be Fact Sheet for Healthcare Providers:  GravelBags.it  This test is not yet ap proved or cleared by the [place] FDA and  has been authorized for detection and/or diagnosis of SARS-CoV-2 by FDA under an Emergency Use Authorization (EUA). This EUA will remain  in effect (meaning this test can be used) for the duration of the COVID-19 declaration under Section 564(b)(1) of the Act, 21 U.S.C. section 360bbb-3(b)(1), unless the authorization is terminated or revoked sooner.    Influenza A by PCR NEGATIVE NEGATIVE Final   Influenza B by PCR NEGATIVE NEGATIVE Final    Comment: (NOTE) The Xpert Xpress SARS-CoV-2/FLU/RSV assay is intended as an aid in  the diagnosis of influenza from Nasopharyngeal swab specimens and  should not be used as a sole basis for treatment. Nasal washings and  aspirates are unacceptable for Xpert Xpress SARS-CoV-2/FLU/RSV  testing. Fact Sheet for Patients: PinkCheek.be Fact Sheet for Healthcare Providers: GravelBags.it This test is not yet approved or cleared by the [place] FDA and  has been authorized for detection and/or diagnosis of SARS-CoV-2 by  FDA under an Emergency Use Authorization (EUA). This EUA will remain  in effect (meaning this test can be used) for the duration of the  Covid-19 declaration under Section 564(b)(1) of the Act, 21  U.S.C. section 360bbb-3(b)(1), unless the authorization is  terminated or revoked. Performed at [facility], [place] [address] [place], [place] [address]   SARS CORONAVIRUS 2 (TAT 6-24 HRS) Nasopharyngeal Nasopharyngeal Swab     Status: None   Collection Time: 03/04/20  2:54 PM   Specimen: Nasopharyngeal Swab  Result Value Ref Range Status   SARS Coronavirus 2 NEGATIVE NEGATIVE Final    Comment: (NOTE) SARS-CoV-2 target nucleic acids are NOT DETECTED. The SARS-CoV-2 RNA is generally detectable in upper and lower respiratory specimens during the acute phase of infection. Negative results do not  preclude SARS-CoV-2 infection, do not rule out co-infections with other pathogens, and should not be used as the sole basis for treatment or other patient management decisions. Negative results must be combined with clinical observations, patient history, and epidemiological information. The expected result is Negative. Fact Sheet for Patients: SugarRoll.be Fact Sheet for Healthcare Providers: https://www.woods-mathews.com/ This test is not yet approved or cleared by the [place] FDA and  has been authorized for detection and/or diagnosis of SARS-CoV-2 by FDA under an Emergency Use Authorization (EUA). This EUA will remain  in effect (meaning this test can be used) for the duration of the COVID-19 declaration under Section 56 4(b)(1) of the Act, 21 U.S.C. section 360bbb-3(b)(1), unless the authorization is terminated or revoked sooner. Performed at [facility], [place] [address], [place], [place] [address]       Radiology Studies: No results found.    Scheduled Meds: . allopurinol  100 mg Oral Daily  . cholecalciferol  2,000 Units Oral Daily  . citalopram  40 mg Oral Daily  . simvastatin  20 mg Oral QHS   Continuous Infusions: . pantoprozole (PROTONIX) infusion 8 mg/hr (03/05/20 0407)     LOS: 4 days      Time spent: 25 minutes   Dessa Phi, DO [facility] 03/05/2020, 10:48 AM   Available via Epic secure chat 7am-7pm After these hours, please refer to coverage provider listed on [email or web address]

## 2020-03-05 NOTE — Plan of Care (Signed)
Plan of care reviewed with pt at bedside. Protonix gtt infusing per orders. Will continue to monitor. Call bell in reach.  Problem: Education: Goal: Knowledge of General Education information will improve Description: Including pain rating scale, medication(s)/side effects and non-pharmacologic comfort measures Outcome: Progressing   Problem: Health Behavior/Discharge Planning: Goal: Ability to manage health-related needs will improve Outcome: Progressing   Problem: Clinical Measurements: Goal: Ability to maintain clinical measurements within normal limits will improve Outcome: Progressing Goal: Will remain free from infection Outcome: Progressing Goal: Diagnostic test results will improve Outcome: Progressing Goal: Respiratory complications will improve Outcome: Progressing Goal: Cardiovascular complication will be avoided Outcome: Progressing   Problem: Activity: Goal: Risk for activity intolerance will decrease Outcome: Progressing   Problem: Nutrition: Goal: Adequate nutrition will be maintained Outcome: Progressing   Problem: Coping: Goal: Level of anxiety will decrease Outcome: Progressing   Problem: Elimination: Goal: Will not experience complications related to bowel motility Outcome: Progressing Goal: Will not experience complications related to urinary retention Outcome: Progressing   Problem: Pain Managment: Goal: General experience of comfort will improve Outcome: Progressing   Problem: Safety: Goal: Ability to remain free from injury will improve Outcome: Progressing   Problem: Skin Integrity: Goal: Risk for impaired skin integrity will decrease Outcome: Progressing

## 2020-03-05 NOTE — Plan of Care (Signed)
  Problem: Clinical Measurements: Goal: Ability to maintain clinical measurements within normal limits will improve Outcome: Progressing   Problem: Coping: Goal: Level of anxiety will decrease Outcome: Progressing   

## 2020-03-06 DIAGNOSIS — Z86711 Personal history of pulmonary embolism: Secondary | ICD-10-CM | POA: Diagnosis not present

## 2020-03-06 DIAGNOSIS — I10 Essential (primary) hypertension: Secondary | ICD-10-CM | POA: Diagnosis not present

## 2020-03-06 DIAGNOSIS — K922 Gastrointestinal hemorrhage, unspecified: Secondary | ICD-10-CM | POA: Diagnosis not present

## 2020-03-06 DIAGNOSIS — E119 Type 2 diabetes mellitus without complications: Secondary | ICD-10-CM | POA: Diagnosis not present

## 2020-03-06 DIAGNOSIS — F39 Unspecified mood [affective] disorder: Secondary | ICD-10-CM | POA: Diagnosis not present

## 2020-03-06 DIAGNOSIS — K254 Chronic or unspecified gastric ulcer with hemorrhage: Secondary | ICD-10-CM | POA: Diagnosis not present

## 2020-03-06 DIAGNOSIS — J449 Chronic obstructive pulmonary disease, unspecified: Secondary | ICD-10-CM | POA: Diagnosis not present

## 2020-03-06 DIAGNOSIS — Z86718 Personal history of other venous thrombosis and embolism: Secondary | ICD-10-CM | POA: Diagnosis not present

## 2020-03-06 DIAGNOSIS — J439 Emphysema, unspecified: Secondary | ICD-10-CM | POA: Diagnosis not present

## 2020-03-06 DIAGNOSIS — M2550 Pain in unspecified joint: Secondary | ICD-10-CM | POA: Diagnosis not present

## 2020-03-06 DIAGNOSIS — R58 Hemorrhage, not elsewhere classified: Secondary | ICD-10-CM | POA: Diagnosis not present

## 2020-03-06 DIAGNOSIS — K269 Duodenal ulcer, unspecified as acute or chronic, without hemorrhage or perforation: Secondary | ICD-10-CM | POA: Diagnosis not present

## 2020-03-06 DIAGNOSIS — F329 Major depressive disorder, single episode, unspecified: Secondary | ICD-10-CM | POA: Diagnosis not present

## 2020-03-06 DIAGNOSIS — K264 Chronic or unspecified duodenal ulcer with hemorrhage: Secondary | ICD-10-CM | POA: Diagnosis not present

## 2020-03-06 DIAGNOSIS — Z7401 Bed confinement status: Secondary | ICD-10-CM | POA: Diagnosis not present

## 2020-03-06 DIAGNOSIS — F29 Unspecified psychosis not due to a substance or known physiological condition: Secondary | ICD-10-CM | POA: Diagnosis not present

## 2020-03-06 DIAGNOSIS — M6281 Muscle weakness (generalized): Secondary | ICD-10-CM | POA: Diagnosis not present

## 2020-03-06 DIAGNOSIS — Z743 Need for continuous supervision: Secondary | ICD-10-CM | POA: Diagnosis not present

## 2020-03-06 DIAGNOSIS — R278 Other lack of coordination: Secondary | ICD-10-CM | POA: Diagnosis not present

## 2020-03-06 DIAGNOSIS — I2510 Atherosclerotic heart disease of native coronary artery without angina pectoris: Secondary | ICD-10-CM | POA: Diagnosis not present

## 2020-03-06 DIAGNOSIS — D649 Anemia, unspecified: Secondary | ICD-10-CM | POA: Diagnosis not present

## 2020-03-06 DIAGNOSIS — K5900 Constipation, unspecified: Secondary | ICD-10-CM | POA: Diagnosis not present

## 2020-03-06 DIAGNOSIS — N400 Enlarged prostate without lower urinary tract symptoms: Secondary | ICD-10-CM | POA: Diagnosis not present

## 2020-03-06 DIAGNOSIS — G4733 Obstructive sleep apnea (adult) (pediatric): Secondary | ICD-10-CM | POA: Diagnosis not present

## 2020-03-06 DIAGNOSIS — I739 Peripheral vascular disease, unspecified: Secondary | ICD-10-CM | POA: Diagnosis not present

## 2020-03-06 DIAGNOSIS — E782 Mixed hyperlipidemia: Secondary | ICD-10-CM | POA: Diagnosis not present

## 2020-03-06 DIAGNOSIS — R4189 Other symptoms and signs involving cognitive functions and awareness: Secondary | ICD-10-CM | POA: Diagnosis not present

## 2020-03-06 DIAGNOSIS — R531 Weakness: Secondary | ICD-10-CM | POA: Diagnosis not present

## 2020-03-06 DIAGNOSIS — E1159 Type 2 diabetes mellitus with other circulatory complications: Secondary | ICD-10-CM | POA: Diagnosis not present

## 2020-03-06 DIAGNOSIS — Z741 Need for assistance with personal care: Secondary | ICD-10-CM | POA: Diagnosis not present

## 2020-03-06 DIAGNOSIS — M109 Gout, unspecified: Secondary | ICD-10-CM | POA: Diagnosis not present

## 2020-03-06 DIAGNOSIS — E039 Hypothyroidism, unspecified: Secondary | ICD-10-CM | POA: Diagnosis not present

## 2020-03-06 LAB — CBC
HCT: 28.3 % — ABNORMAL LOW (ref 39.0–52.0)
Hemoglobin: 9 g/dL — ABNORMAL LOW (ref 13.0–17.0)
MCH: 31.3 pg (ref 26.0–34.0)
MCHC: 31.8 g/dL (ref 30.0–36.0)
MCV: 98.3 fL (ref 80.0–100.0)
Platelets: 146 10*3/uL — ABNORMAL LOW (ref 150–400)
RBC: 2.88 MIL/uL — ABNORMAL LOW (ref 4.22–5.81)
RDW: 13.8 % (ref 11.5–15.5)
WBC: 11.9 10*3/uL — ABNORMAL HIGH (ref 4.0–10.5)
nRBC: 0 % (ref 0.0–0.2)

## 2020-03-06 LAB — BASIC METABOLIC PANEL
Anion gap: 9 (ref 5–15)
BUN: 17 mg/dL (ref 8–23)
CO2: 25 mmol/L (ref 22–32)
Calcium: 8.9 mg/dL (ref 8.9–10.3)
Chloride: 101 mmol/L (ref 98–111)
Creatinine, Ser: 1.06 mg/dL (ref 0.61–1.24)
GFR calc Af Amer: >60 mL/min (ref >60)
GFR calc non Af Amer: >60 mL/min (ref >60)
Glucose, Bld: 126 mg/dL — ABNORMAL HIGH (ref 70–99)
Potassium: 4.1 mmol/L (ref 3.5–5.1)
Sodium: 135 mmol/L (ref 135–145)

## 2020-03-06 MED ORDER — CYCLOBENZAPRINE HCL 5 MG PO TABS: 5.0000 mg | ORAL_TABLET | Freq: Three times a day (TID) | ORAL | 0 refills | Status: DC | PRN

## 2020-03-06 MED ORDER — PANTOPRAZOLE SODIUM 40 MG PO TBEC: 40.0000 mg | DELAYED_RELEASE_TABLET | Freq: Two times a day (BID) | ORAL | 1 refills | Status: DC

## 2020-03-06 MED ORDER — ACETAMINOPHEN 325 MG PO TABS: 650.0000 mg | ORAL_TABLET | Freq: Four times a day (QID) | ORAL | Status: DC | PRN

## 2020-03-06 NOTE — Plan of Care (Addendum)
Plan of care reviewed with pt at bedside. Protonix gtt stopped per orders. Call bell in reach. Discharge instructions provided. All questions answered. Will continue to monitor pt until dc with [facility].  1206: Daughter took pt's wallet and two rings with her.  Problem: Education: Goal: Knowledge of General Education information will improve Description: Including pain rating scale, medication(s)/side effects and non-pharmacologic comfort measures Outcome: Adequate for Discharge   Problem: Health Behavior/Discharge Planning: Goal: Ability to manage health-related needs will improve Outcome: Adequate for Discharge   Problem: Clinical Measurements: Goal: Ability to maintain clinical measurements within normal limits will improve Outcome: Adequate for Discharge Goal: Will remain free from infection Outcome: Adequate for Discharge Goal: Diagnostic test results will improve Outcome: Adequate for Discharge Goal: Respiratory complications will improve Outcome: Adequate for Discharge Goal: Cardiovascular complication will be avoided Outcome: Adequate for Discharge   Problem: Activity: Goal: Risk for activity intolerance will decrease Outcome: Adequate for Discharge   Problem: Nutrition: Goal: Adequate nutrition will be maintained Outcome: Adequate for Discharge   Problem: Coping: Goal: Level of anxiety will decrease Outcome: Adequate for Discharge   Problem: Elimination: Goal: Will not experience complications related to bowel motility Outcome: Adequate for Discharge Goal: Will not experience complications related to urinary retention Outcome: Adequate for Discharge   Problem: Pain Managment: Goal: General experience of comfort will improve Outcome: Adequate for Discharge   Problem: Safety: Goal: Ability to remain free from injury will improve Outcome: Adequate for Discharge   Problem: Skin Integrity: Goal: Risk for impaired skin integrity will decrease Outcome: Adequate for  Discharge   Problem: Acute Rehab PT Goals(only PT should resolve) Goal: Pt Will Go Supine/Side To Sit Outcome: Adequate for Discharge Goal: Patient Will Transfer Sit To/From Stand Outcome: Adequate for Discharge Goal: Pt Will Ambulate Outcome: Adequate for Discharge Goal: Pt Will Go Up/Down Stairs Outcome: Adequate for Discharge Goal: PT Additional Goal #1 Outcome: Adequate for Discharge   Problem: Acute Rehab OT Goals (only OT should resolve) Goal: Pt. Will Perform Grooming Outcome: Adequate for Discharge Goal: Pt. Will Perform Lower Body Dressing Outcome: Adequate for Discharge Goal: Pt. Will Transfer To Toilet Outcome: Adequate for Discharge Goal: Pt. Will Perform Toileting-Clothing Manipulation Outcome: Adequate for Discharge

## 2020-03-06 NOTE — Discharge Summary (Signed)
Physician Discharge Summary  Christopher Santiago [id number] DOB: [birth year] DOA: 03/01/2020  PCP: Tonia Ghent, MD  Admit date: 03/01/2020 Discharge date: 03/06/2020  Admitted From: Home  Disposition: Skilled nursing facility  Recommendations for Outpatient Follow-up:  1. Follow up with PCP in 1-2 weeks 2. Please obtain BMP/CBC in one week   Discharge Condition: Stable CODE STATUS: Full code Diet recommendation: Regular diet  Discharge summary: 86 year old gentleman with history of ascending aortic aneurysm, COPD, anemia, history of PE and DVT not on anticoagulation, peripheral vascular disease presented with constipation, nausea and coffee-ground emesis.  Patient also drinks beer every day, takes baby aspirin and indomethacin for his back pain.  Patient was admitted with concern of upper GI bleeding.  Underwent upper GI endoscopy on 4/10 that revealed 7 small clean-based gastric ulcers, gastritis, large duodenal bulb ulcer treated with monopolar probe.  Overall deconditioned.  Needs to go to skilled nursing facility.  Treated for following conditions.  Upper GI bleeding: Hemoglobin is stable.  Not needed any transfusion.  Probably NSAIDs induced ulcers.  EGD with multiple small ulcers and duodenal bulb ulcers. Treated with PPI infusion for 72 hours and now converted to Protonix twice a day.  H. pylori negative. Avoid all NSAIDs.  Continue Protonix twice daily.  CKD stage IIIa: At about baseline.  Hyperlipidemia: Continue statin.  Tolerating.  Gout: On allopurinol that he will continue.  Will stop indomethacin.  Patient has significant pain, will use Tylenol, will prescribe some Flexeril to be used in supervised settings.  Depression/anxiety: Uses Celexa.  Physical deconditioning: With profound physical deconditioning.  Will benefit with inpatient therapies.  Stable to transfer to skilled level of care.    Discharge Diagnoses:  Active Problems:   Upper GI bleed   Lower GI  bleed    Discharge Instructions  Discharge Instructions    Diet general   Complete by: As directed    Increase activity slowly   Complete by: As directed      Allergies as of 03/06/2020      Reactions   Tessalon [benzonatate] Other (See Comments)   Ineffective, nasal congestion      Medication List    STOP taking these medications   aspirin 81 MG EC tablet   indomethacin 50 MG capsule Commonly known as: INDOCIN   potassium chloride SA 20 MEQ tablet Commonly known as: KLOR-CON     TAKE these medications   acetaminophen 325 MG tablet Commonly known as: TYLENOL Take 2 tablets (650 mg total) by mouth every 6 (six) hours as needed for mild pain (or Fever >/= 101).   allopurinol 100 MG tablet Commonly known as: ZYLOPRIM TAKE 1 TABLET BY MOUTH ONCE A DAY   Cholecalciferol 50 MCG (2000 UT) Caps Take 2,000 Units by mouth daily.   citalopram 40 MG tablet Commonly known as: CELEXA TAKE 1 AND 1/2 TABLETS BY MOUTH ONCE A DAY What changed: See the new instructions.   cyclobenzaprine 5 MG tablet Commonly known as: FLEXERIL Take 1 tablet (5 mg total) by mouth 3 (three) times daily as needed for muscle spasms.   furosemide 40 MG tablet Commonly known as: LASIX TAKE 1 TABLET BY MOUTH ONCE DAILY AS NEEDED FOR SHORTNESS OF BREATH What changed: See the new instructions.   pantoprazole 40 MG tablet Commonly known as: PROTONIX Take 1 tablet (40 mg total) by mouth 2 (two) times daily before a meal.   simvastatin 20 MG tablet Commonly known as: ZOCOR TAKE ONE TABLET BY MOUTH AT  BEDTIME   Ventolin HFA 108 (90 Base) MCG/ACT inhaler Generic drug: albuterol INHALE 2 PUFFS INTO THE LUNGS EVERY 6 HOURS AS NEEDED FOR WHEEZING OR SHORTNESS OF BREATH What changed: See the new instructions.      Contact information for after-discharge care    Destination    [facility] .   Service: Skilled Nursing Contact information: [place] [address] [phone]             Allergies  Allergen Reactions  . Tessalon [Benzonatate] Other (See Comments)    Ineffective, nasal congestion    Consultations:  Gastroenterology   Procedures/Studies: CT ABDOMEN PELVIS W CONTRAST  Result Date: 03/01/2020 CLINICAL DATA:  Constipation, generalized abdominal pain, nausea, vomiting EXAM: CT ABDOMEN AND PELVIS WITH CONTRAST TECHNIQUE: Multidetector CT imaging of the abdomen and pelvis was performed using the standard protocol following bolus administration of intravenous contrast. CONTRAST:  151mL OMNIPAQUE IOHEXOL 300 MG/ML  SOLN COMPARISON:  None. FINDINGS: Lower chest: No acute abnormality. Hepatobiliary: Mild diffuse low-density throughout the liver compatible with fatty infiltration. 9 mm low-density lesion in the right hepatic lobe, likely small cyst. Gallbladder unremarkable. Pancreas: No focal abnormality or ductal dilatation. Spleen: 1.6 cm low-density lesion in the spleen, likely small cyst or hemangioma. Normal size. Adrenals/Urinary Tract: 3 cm lesion in the right adrenal gland. Low-density lesion within the midpole of the left kidney measures 2 cm. This does not appear cystic. Cyst in the lower pole of the left kidney measures 2 cm. No hydronephrosis. Small cyst in the upper pole of the right kidney. Urinary bladder unremarkable. Stomach/Bowel: Sigmoid diverticulosis. No active diverticulitis. Bowel decompressed. No evidence of bowel obstruction. Vascular/Lymphatic: 3.4 cm infrarenal abdominal aortic aneurysm. Aortic atherosclerosis. IVC filter in place. No adenopathy. Reproductive: No visible focal abnormality. Other: This No free fluid or free air. Musculoskeletal: Prior right hip replacement. No acute bony abnormality. IMPRESSION: Mild diffuse fatty infiltration of the liver. Small cysts in the kidneys. There is a 2 cm lesion in the midpole of the left kidney which does not appear cystic. Cannot exclude solid renal lesion.  When the patient is clinically stable and able to follow directions and hold their breath (preferably as an outpatient) further evaluation with dedicated abdominal MRI should be considered if felt clinically indicated. 3.4 cm infrarenal abdominal aortic aneurysm. Recommend followup by ultrasound in 3 years. This recommendation follows ACR consensus guidelines: White Paper of the ACR Incidental Findings Committee II on Vascular Findings. J Am Coll Radiol 2013; 10:789-794. Aortic aneurysm NOS (ICD10-I71.9) Nonspecific 3 cm right adrenal lesion. This could also be evaluated with MRI as well. Sigmoid diverticulosis.  No active diverticulitis. Electronically Signed   By: Rolm Baptise M.D.   On: 03/01/2020 17:38    EGD   Subjective: Patient seen and examined.  Daughter at the bedside.  Patient himself denies any complaints.  He is looking forward to go to rehab. Discussed in detail about medications to avoid.  Will start on new medicine and needs to monitor.   Discharge Exam: Vitals:   03/06/20 0439 03/06/20 0756  BP: (!) 145/60   Pulse: 88   Resp: 19 20  Temp: 100.3 F (37.9 C)   SpO2: 91%    Vitals:   03/05/20 1431 03/05/20 2039 03/06/20 0439 03/06/20 0756  BP: 136/60 (!) 170/63 (!) 145/60   Pulse: 74 77 88   Resp: 18 18 19 20   Temp: 98.3 F (36.8 C) 99.6 F (37.6 C) 100.3  F (37.9 C)   TempSrc: Oral Oral Oral   SpO2: 96% 92% 91%   Weight:      Height:        General: Pt is alert, awake, not in acute distress Cardiovascular: RRR, S1/S2 +, no rubs, no gallops Respiratory: CTA bilaterally, no wheezing, no rhonchi Abdominal: Soft, NT, ND, bowel sounds + Extremities: no edema, no cyanosis    The results of significant diagnostics from this hospitalization (including imaging, microbiology, ancillary and laboratory) are listed below for reference.     Microbiology: Recent Results (from the past 240 hour(s))  Respiratory Panel by RT PCR (Flu A&B, Covid) - Nasopharyngeal Swab      Status: None   Collection Time: 03/01/20  2:44 PM   Specimen: Nasopharyngeal Swab  Result Value Ref Range Status   SARS Coronavirus 2 by RT PCR NEGATIVE NEGATIVE Final    Comment: (NOTE) SARS-CoV-2 target nucleic acids are NOT DETECTED. The SARS-CoV-2 RNA is generally detectable in upper respiratoy specimens during the acute phase of infection. The lowest concentration of SARS-CoV-2 viral copies this assay can detect is 131 copies/mL. A negative result does not preclude SARS-Cov-2 infection and should not be used as the sole basis for treatment or other patient management decisions. A negative result may occur with  improper specimen collection/handling, submission of specimen other than nasopharyngeal swab, presence of viral mutation(s) within the areas targeted by this assay, and inadequate number of viral copies (<131 copies/mL). A negative result must be combined with clinical observations, patient history, and epidemiological information. The expected result is Negative. Fact Sheet for Patients:  PinkCheek.be Fact Sheet for Healthcare Providers:  GravelBags.it This test is not yet ap proved or cleared by the [place] FDA and  has been authorized for detection and/or diagnosis of SARS-CoV-2 by FDA under an Emergency Use Authorization (EUA). This EUA will remain  in effect (meaning this test can be used) for the duration of the COVID-19 declaration under Section 564(b)(1) of the Act, 21 U.S.C. section 360bbb-3(b)(1), unless the authorization is terminated or revoked sooner.    Influenza A by PCR NEGATIVE NEGATIVE Final   Influenza B by PCR NEGATIVE NEGATIVE Final    Comment: (NOTE) The Xpert Xpress SARS-CoV-2/FLU/RSV assay is intended as an aid in  the diagnosis of influenza from Nasopharyngeal swab specimens and  should not be used as a sole basis for treatment. Nasal washings and  aspirates are unacceptable for  Xpert Xpress SARS-CoV-2/FLU/RSV  testing. Fact Sheet for Patients: PinkCheek.be Fact Sheet for Healthcare Providers: GravelBags.it This test is not yet approved or cleared by the [place] FDA and  has been authorized for detection and/or diagnosis of SARS-CoV-2 by  FDA under an Emergency Use Authorization (EUA). This EUA will remain  in effect (meaning this test can be used) for the duration of the  Covid-19 declaration under Section 564(b)(1) of the Act, 21  U.S.C. section 360bbb-3(b)(1), unless the authorization is  terminated or revoked. Performed at [facility], [place] [address], [place], [place] [address]   SARS CORONAVIRUS 2 (TAT 6-24 HRS) Nasopharyngeal Nasopharyngeal Swab     Status: None   Collection Time: 03/04/20  2:54 PM   Specimen: Nasopharyngeal Swab  Result Value Ref Range Status   SARS Coronavirus 2 NEGATIVE NEGATIVE Final    Comment: (NOTE) SARS-CoV-2 target nucleic acids are NOT DETECTED. The SARS-CoV-2 RNA is generally detectable in upper and lower respiratory specimens during the acute phase of infection. Negative results do not  preclude SARS-CoV-2 infection, do not rule out co-infections with other pathogens, and should not be used as the sole basis for treatment or other patient management decisions. Negative results must be combined with clinical observations, patient history, and epidemiological information. The expected result is Negative. Fact Sheet for Patients: SugarRoll.be Fact Sheet for Healthcare Providers: https://www.woods-mathews.com/ This test is not yet approved or cleared by the [place] FDA and  has been authorized for detection and/or diagnosis of SARS-CoV-2 by FDA under an Emergency Use Authorization (EUA). This EUA will remain  in effect (meaning this test can be used) for the duration of the COVID-19 declaration under Section  56 4(b)(1) of the Act, 21 U.S.C. section 360bbb-3(b)(1), unless the authorization is terminated or revoked sooner. Performed at [facility], [place] [address], [place], [place] [address]      Labs: BNP (last 3 results) No results for input(s): BNP in the last 8760 hours. Basic Metabolic Panel: Recent Labs  Lab 03/01/20 1500 03/02/20 0351 03/03/20 0441 03/04/20 0254 03/06/20 0443  NA 140 138 140 137 135  K 4.7 4.3 4.0 4.0 4.1  CL 103 107 106 102 101  CO2 27 24 25 25 25   GLUCOSE 138* 80 80 93 126*  BUN 62* 57* 28* 20 17  CREATININE 1.53* 1.16 1.08 1.09 1.06  CALCIUM 9.1 9.0 9.1 9.0 8.9   Liver Function Tests: Recent Labs  Lab 03/01/20 1500  AST 18  ALT 12  ALKPHOS 43  BILITOT 0.7  PROT 5.9*  ALBUMIN 2.9*   Recent Labs  Lab 03/01/20 1500  LIPASE 34   No results for input(s): AMMONIA in the last 168 hours. CBC: Recent Labs  Lab 03/01/20 1500 03/01/20 1500 03/01/20 2200 03/01/20 2200 03/02/20 0351 03/02/20 1320 03/02/20 2112 03/02/20 2112 03/03/20 0441 03/03/20 1227 03/04/20 0254 03/05/20 0210 03/06/20 0443  WBC 10.4  --  8.0  --  9.4  --   --   --  7.5  --   --   --  11.9*  NEUTROABS 8.6*  --   --   --   --   --   --   --   --   --   --   --   --   HGB 9.5*   < > 9.1*   < > 8.9*   < > 8.2*   < > 8.7* 8.2* 8.6* 8.6* 9.0*  HCT 30.6*   < > 29.2*   < > 28.3*   < > 26.4*  --  28.3* 26.7*  --  27.6* 28.3*  MCV 97.8  --  98.0  --  97.6  --   --   --  98.3  --   --   --  98.3  PLT 162  --  154  --  142*  --   --   --  150  --   --   --  146*   < > = values in this interval not displayed.   Cardiac Enzymes: No results for input(s): CKTOTAL, CKMB, CKMBINDEX, TROPONINI in the last 168 hours. BNP: Invalid input(s): POCBNP CBG: No results for input(s): GLUCAP in the last 168 hours. D-Dimer No results for input(s): DDIMER in the last 72 hours. Hgb A1c No results for input(s): HGBA1C in the last 72 hours. Lipid Profile No results for input(s): CHOL,  HDL, LDLCALC, TRIG, CHOLHDL, LDLDIRECT in the last 72 hours. Thyroid function studies No results for input(s): TSH, T4TOTAL, T3FREE, THYROIDAB in  the last 72 hours.  Invalid input(s): FREET3 Anemia work up No results for input(s): VITAMINB12, FOLATE, FERRITIN, TIBC, IRON, RETICCTPCT in the last 72 hours. Urinalysis    Component Value Date/Time   COLORURINE YELLOW 03/01/2020 1750   APPEARANCEUR CLEAR 03/01/2020 1750   LABSPEC 1.036 (H) 03/01/2020 1750   PHURINE 5.0 03/01/2020 1750   GLUCOSEU NEGATIVE 03/01/2020 1750   HGBUR NEGATIVE 03/01/2020 1750   BILIRUBINUR NEGATIVE 03/01/2020 1750   KETONESUR NEGATIVE 03/01/2020 1750   PROTEINUR NEGATIVE 03/01/2020 1750   UROBILINOGEN 0.2 08/31/2009 1233   NITRITE NEGATIVE 03/01/2020 1750   LEUKOCYTESUR NEGATIVE 03/01/2020 1750   Sepsis Labs Invalid input(s): PROCALCITONIN,  WBC,  LACTICIDVEN Microbiology Recent Results (from the past 240 hour(s))  Respiratory Panel by RT PCR (Flu A&B, Covid) - Nasopharyngeal Swab     Status: None   Collection Time: 03/01/20  2:44 PM   Specimen: Nasopharyngeal Swab  Result Value Ref Range Status   SARS Coronavirus 2 by RT PCR NEGATIVE NEGATIVE Final    Comment: (NOTE) SARS-CoV-2 target nucleic acids are NOT DETECTED. The SARS-CoV-2 RNA is generally detectable in upper respiratoy specimens during the acute phase of infection. The lowest concentration of SARS-CoV-2 viral copies this assay can detect is 131 copies/mL. A negative result does not preclude SARS-Cov-2 infection and should not be used as the sole basis for treatment or other patient management decisions. A negative result may occur with  improper specimen collection/handling, submission of specimen other than nasopharyngeal swab, presence of viral mutation(s) within the areas targeted by this assay, and inadequate number of viral copies (<131 copies/mL). A negative result must be combined with clinical observations, patient history, and  epidemiological information. The expected result is Negative. Fact Sheet for Patients:  PinkCheek.be Fact Sheet for Healthcare Providers:  GravelBags.it This test is not yet ap proved or cleared by the [place] FDA and  has been authorized for detection and/or diagnosis of SARS-CoV-2 by FDA under an Emergency Use Authorization (EUA). This EUA will remain  in effect (meaning this test can be used) for the duration of the COVID-19 declaration under Section 564(b)(1) of the Act, 21 U.S.C. section 360bbb-3(b)(1), unless the authorization is terminated or revoked sooner.    Influenza A by PCR NEGATIVE NEGATIVE Final   Influenza B by PCR NEGATIVE NEGATIVE Final    Comment: (NOTE) The Xpert Xpress SARS-CoV-2/FLU/RSV assay is intended as an aid in  the diagnosis of influenza from Nasopharyngeal swab specimens and  should not be used as a sole basis for treatment. Nasal washings and  aspirates are unacceptable for Xpert Xpress SARS-CoV-2/FLU/RSV  testing. Fact Sheet for Patients: PinkCheek.be Fact Sheet for Healthcare Providers: GravelBags.it This test is not yet approved or cleared by the [place] FDA and  has been authorized for detection and/or diagnosis of SARS-CoV-2 by  FDA under an Emergency Use Authorization (EUA). This EUA will remain  in effect (meaning this test can be used) for the duration of the  Covid-19 declaration under Section 564(b)(1) of the Act, 21  U.S.C. section 360bbb-3(b)(1), unless the authorization is  terminated or revoked. Performed at [facility], [place] [address], [place], [place] [address]   SARS CORONAVIRUS 2 (TAT 6-24 HRS) Nasopharyngeal Nasopharyngeal Swab     Status: None   Collection Time: 03/04/20  2:54 PM   Specimen: Nasopharyngeal Swab  Result Value Ref Range Status   SARS Coronavirus 2 NEGATIVE NEGATIVE Final     Comment: (NOTE) SARS-CoV-2 target nucleic acids are  NOT DETECTED. The SARS-CoV-2 RNA is generally detectable in upper and lower respiratory specimens during the acute phase of infection. Negative results do not preclude SARS-CoV-2 infection, do not rule out co-infections with other pathogens, and should not be used as the sole basis for treatment or other patient management decisions. Negative results must be combined with clinical observations, patient history, and epidemiological information. The expected result is Negative. Fact Sheet for Patients: SugarRoll.be Fact Sheet for Healthcare Providers: https://www.woods-mathews.com/ This test is not yet approved or cleared by the [place] FDA and  has been authorized for detection and/or diagnosis of SARS-CoV-2 by FDA under an Emergency Use Authorization (EUA). This EUA will remain  in effect (meaning this test can be used) for the duration of the COVID-19 declaration under Section 56 4(b)(1) of the Act, 21 U.S.C. section 360bbb-3(b)(1), unless the authorization is terminated or revoked sooner. Performed at [facility], [place] [address], [place], [place] [address]      Time coordinating discharge:  35 minutes  SIGNED:   Barb Merino, MD  [facility] 03/06/2020, 11:21 AM

## 2020-03-06 NOTE — Progress Notes (Signed)
PTAR was set up for 1330.  Report was called to [phone] and given to [place].  Pt stable at this time, will continue to monitor until dc from floor.

## 2020-03-06 NOTE — Care Management (Addendum)
  1200 Discharge summary sent to Seth Bake at [facility]. Seth Bake is ready for patient to transfer. Bedside nurse to call report to [phone] patient is going to room 210.   Seth Bake at [facility] able to accept patient today. Patient is medically stable for discharge. MD working on discharge summary.   Patient and daughter at bedside aware, PTAR will be arranged once discharge paperwork completed. They will notify patient's wife , that patient will be going to [facility] today. Patient's wife currently at MD appointment.  Magdalen Spatz RN

## 2020-03-07 DIAGNOSIS — I739 Peripheral vascular disease, unspecified: Secondary | ICD-10-CM

## 2020-03-07 DIAGNOSIS — J439 Emphysema, unspecified: Secondary | ICD-10-CM

## 2020-03-07 DIAGNOSIS — K269 Duodenal ulcer, unspecified as acute or chronic, without hemorrhage or perforation: Secondary | ICD-10-CM

## 2020-03-07 DIAGNOSIS — R531 Weakness: Secondary | ICD-10-CM

## 2020-03-07 DIAGNOSIS — E1159 Type 2 diabetes mellitus with other circulatory complications: Secondary | ICD-10-CM

## 2020-03-07 DIAGNOSIS — I2510 Atherosclerotic heart disease of native coronary artery without angina pectoris: Secondary | ICD-10-CM

## 2020-03-07 DIAGNOSIS — F39 Unspecified mood [affective] disorder: Secondary | ICD-10-CM

## 2020-03-21 DIAGNOSIS — K922 Gastrointestinal hemorrhage, unspecified: Secondary | ICD-10-CM

## 2020-03-21 DIAGNOSIS — I2510 Atherosclerotic heart disease of native coronary artery without angina pectoris: Secondary | ICD-10-CM | POA: Diagnosis not present

## 2020-03-21 DIAGNOSIS — J439 Emphysema, unspecified: Secondary | ICD-10-CM | POA: Diagnosis not present

## 2020-03-21 DIAGNOSIS — I739 Peripheral vascular disease, unspecified: Secondary | ICD-10-CM | POA: Diagnosis not present

## 2020-03-21 DIAGNOSIS — F39 Unspecified mood [affective] disorder: Secondary | ICD-10-CM | POA: Diagnosis not present

## 2020-03-22 ENCOUNTER — Telehealth: Payer: Self-pay

## 2020-03-22 NOTE — Telephone Encounter (Signed)
Noted  

## 2020-03-22 NOTE — Telephone Encounter (Signed)
Concha Se Director of [facility] left v/m that pt was recently discharged from rehab and [facility] evaluation is delayed until 03/25/20. FYI to DR G in case this time frame is not OK.

## 2020-03-26 ENCOUNTER — Telehealth: Payer: Self-pay

## 2020-03-26 DIAGNOSIS — D649 Anemia, unspecified: Secondary | ICD-10-CM | POA: Diagnosis not present

## 2020-03-26 NOTE — Telephone Encounter (Signed)
Please give the order.  Thanks.   

## 2020-03-26 NOTE — Telephone Encounter (Signed)
Mardene Celeste with [facility] requesting verbal orders for [facility] nursing 1 x a wk for 4 wks for disease education for new dx of gastric ulcer and anemia.Please advise.

## 2020-03-27 ENCOUNTER — Telehealth: Payer: Self-pay

## 2020-03-27 NOTE — Telephone Encounter (Signed)
Agree with this.  May give verbal order from me - covering for Dr Damita Dunnings.

## 2020-03-27 NOTE — Telephone Encounter (Signed)
Christopher Santiago returned phone call. Verbal orders given

## 2020-03-27 NOTE — Telephone Encounter (Signed)
I left message for Christopher Santiago to return phone call.

## 2020-03-27 NOTE — Telephone Encounter (Signed)
Erline Levine, PT with [facility] requesting verbal orders for PT services 1x a week for 1 week, 2x a week for 3 weeks, and 1x a week for 3 weeks. She can be reached at [phone] and a VM can be left if she does not answer.

## 2020-03-28 NOTE — Telephone Encounter (Signed)
Spoke with Erline Levine informing her Dr. Darnell Level, in Dr. Josefine Class absence, is giving verbal orders for services requested.  She verbalizes understanding.

## 2020-03-29 ENCOUNTER — Telehealth: Payer: Self-pay

## 2020-03-29 NOTE — Telephone Encounter (Signed)
Esther advised. 

## 2020-03-29 NOTE — Telephone Encounter (Signed)
Esther OT with [facility] left v/m requesting verbal orders for [facility] OT 1 x a wk for 1 wk; 2 x a wk for 1 wk and 1 x a wk for 1 wk.

## 2020-03-29 NOTE — Telephone Encounter (Signed)
Agree with this. Thank you.  

## 2020-04-01 ENCOUNTER — Encounter: Payer: Self-pay | Admitting: Family Medicine

## 2020-04-01 ENCOUNTER — Other Ambulatory Visit: Payer: Self-pay

## 2020-04-01 ENCOUNTER — Ambulatory Visit (INDEPENDENT_AMBULATORY_CARE_PROVIDER_SITE_OTHER): Payer: Medicare Other | Admitting: Family Medicine

## 2020-04-01 VITALS — BP 142/60 | HR 75 | Temp 97.1°F | Ht 69.0 in | Wt 180.1 lb

## 2020-04-01 DIAGNOSIS — D649 Anemia, unspecified: Secondary | ICD-10-CM

## 2020-04-01 DIAGNOSIS — N401 Enlarged prostate with lower urinary tract symptoms: Secondary | ICD-10-CM | POA: Diagnosis not present

## 2020-04-01 DIAGNOSIS — N138 Other obstructive and reflux uropathy: Secondary | ICD-10-CM

## 2020-04-01 DIAGNOSIS — I25118 Atherosclerotic heart disease of native coronary artery with other forms of angina pectoris: Secondary | ICD-10-CM | POA: Diagnosis not present

## 2020-04-01 DIAGNOSIS — R0789 Other chest pain: Secondary | ICD-10-CM

## 2020-04-01 LAB — CBC WITH DIFFERENTIAL/PLATELET
Basophils Absolute: 0 10*3/uL (ref 0.0–0.1)
Basophils Relative: 0.5 % (ref 0.0–3.0)
Eosinophils Absolute: 0.1 10*3/uL (ref 0.0–0.7)
Eosinophils Relative: 1.1 % (ref 0.0–5.0)
HCT: 31.6 % — ABNORMAL LOW (ref 39.0–52.0)
Hemoglobin: 10.1 g/dL — ABNORMAL LOW (ref 13.0–17.0)
Lymphocytes Relative: 18.5 % (ref 12.0–46.0)
Lymphs Abs: 1.5 10*3/uL (ref 0.7–4.0)
MCHC: 32.2 g/dL (ref 30.0–36.0)
MCV: 93.9 fl (ref 78.0–100.0)
Monocytes Absolute: 0.7 10*3/uL (ref 0.1–1.0)
Monocytes Relative: 8.5 % (ref 3.0–12.0)
Neutro Abs: 6 10*3/uL (ref 1.4–7.7)
Neutrophils Relative %: 71.4 % (ref 43.0–77.0)
Platelets: 173 10*3/uL (ref 150.0–400.0)
RBC: 3.36 Mil/uL — ABNORMAL LOW (ref 4.22–5.81)
RDW: 15.1 % (ref 11.5–15.5)
WBC: 8.4 10*3/uL (ref 4.0–10.5)

## 2020-04-01 LAB — HEPATIC FUNCTION PANEL
ALT: 10 U/L (ref 0–53)
AST: 17 U/L (ref 0–37)
Albumin: 4 g/dL (ref 3.5–5.2)
Alkaline Phosphatase: 43 U/L (ref 39–117)
Bilirubin, Direct: 0.1 mg/dL (ref 0.0–0.3)
Total Bilirubin: 0.5 mg/dL (ref 0.2–1.2)
Total Protein: 7.3 g/dL (ref 6.0–8.3)

## 2020-04-01 LAB — BASIC METABOLIC PANEL
BUN: 31 mg/dL — ABNORMAL HIGH (ref 6–23)
CO2: 28 mEq/L (ref 19–32)
Calcium: 9.7 mg/dL (ref 8.4–10.5)
Chloride: 100 mEq/L (ref 96–112)
Creatinine, Ser: 1.07 mg/dL (ref 0.40–1.50)
GFR: 65.35 mL/min (ref >60.00)
Glucose, Bld: 83 mg/dL (ref 70–99)
Potassium: 4.9 mEq/L (ref 3.5–5.1)
Sodium: 135 mEq/L (ref 135–145)

## 2020-04-01 LAB — IRON: Iron: 53 ug/dL (ref 42–165)

## 2020-04-01 MED ORDER — PANTOPRAZOLE SODIUM 40 MG PO TBEC: 40.0000 mg | DELAYED_RELEASE_TABLET | Freq: Two times a day (BID) | ORAL | 5 refills | Status: DC

## 2020-04-01 MED ORDER — DOCUSATE SODIUM 100 MG PO CAPS: 100.0000 mg | ORAL_CAPSULE | Freq: Two times a day (BID) | ORAL | 5 refills | Status: DC

## 2020-04-01 MED ORDER — FUROSEMIDE 40 MG PO TABS: ORAL_TABLET | ORAL | 1 refills | Status: DC

## 2020-04-01 MED ORDER — ALBUTEROL SULFATE HFA 108 (90 BASE) MCG/ACT IN AERS: 1.0000 | INHALATION_SPRAY | Freq: Four times a day (QID) | RESPIRATORY_TRACT | 5 refills | Status: AC | PRN

## 2020-04-01 NOTE — Progress Notes (Signed)
This visit occurred during the SARS-CoV-2 public health emergency.  Safety protocols were in place, including screening questions prior to the visit, additional usage of staff PPE, and extensive cleaning of exam room while observing appropriate contact time as indicated for disinfecting solutions.  He had covid vaccine prior to admission, back in 3/21.  [employer], per patient report.    Admitted with GIB, then to SNF, now back home.  Here for follow-up.  He has a history of gastric ulcers noted.  Currently off NSAIDs.  Discussed.  Follow-up labs pending.  He has some shortness of breath that was noted prior to his inpatient stay, likely worsened by anemia.  This is clearly better in the meantime.  He is exercising with physical therapy.  He has had some nonexertional left-sided chest discomfort that does not happen daily.  No symptoms at time of exam.  No vomiting or diarrhea.  No constipation.  Chest wall is not tender to palpation.  He does have a history of falls noted.  Symptoms going on intermittently for the past approximately 10 days.  He has no symptoms currently.  He does have a history of concurrent back pain for which he has been using Tylenol occasionally with some relief.  He is caring for his wife and he caught her to prevent her from falling.  Routine cautions discussed with patient.  Baseline rhinorrhea continues.  This is a longstanding issue for the patient.  He has LUTS, nocturia x4.   Meds, vitals, and allergies reviewed.   ROS: Per HPI unless specifically indicated in ROS section   GEN: nad, alert and oriented HEENT: ncat NECK: supple w/o LA CV: rrr.   PULM: ctab, no inc wob, chest wall nontender. ABD: soft, +bs EXT: no edema SKIN: no acute rash but senile purpura noted.

## 2020-04-01 NOTE — Patient Instructions (Signed)
Go to the lab on the way out.   If you have mychart we'll likely use that to update you.    ?Take care.  Glad to see you. ?Don't change your meds for now.  ?

## 2020-04-02 ENCOUNTER — Ambulatory Visit: Payer: Medicare Other

## 2020-04-02 ENCOUNTER — Telehealth: Payer: Self-pay

## 2020-04-02 NOTE — Telephone Encounter (Signed)
Called patient to complete his Medicare visit. Patient stated that he needed to cancel today's appointment and he would call the office back and reschedule at a time that works best for him. Appointment cancelled per patient request.

## 2020-04-09 ENCOUNTER — Ambulatory Visit (INDEPENDENT_AMBULATORY_CARE_PROVIDER_SITE_OTHER): Payer: Medicare Other | Admitting: Cardiovascular Disease

## 2020-04-09 ENCOUNTER — Encounter: Payer: Self-pay | Admitting: Cardiovascular Disease

## 2020-04-09 ENCOUNTER — Other Ambulatory Visit: Payer: Self-pay

## 2020-04-09 VITALS — BP 152/62 | HR 72 | Ht 69.0 in | Wt 180.0 lb

## 2020-04-09 DIAGNOSIS — I739 Peripheral vascular disease, unspecified: Secondary | ICD-10-CM | POA: Diagnosis not present

## 2020-04-09 DIAGNOSIS — E782 Mixed hyperlipidemia: Secondary | ICD-10-CM | POA: Diagnosis not present

## 2020-04-09 DIAGNOSIS — I25118 Atherosclerotic heart disease of native coronary artery with other forms of angina pectoris: Secondary | ICD-10-CM

## 2020-04-09 DIAGNOSIS — I77810 Thoracic aortic ectasia: Secondary | ICD-10-CM | POA: Diagnosis not present

## 2020-04-09 DIAGNOSIS — I272 Pulmonary hypertension, unspecified: Secondary | ICD-10-CM

## 2020-04-09 DIAGNOSIS — R0602 Shortness of breath: Secondary | ICD-10-CM

## 2020-04-09 DIAGNOSIS — I5032 Chronic diastolic (congestive) heart failure: Secondary | ICD-10-CM

## 2020-04-09 MED ORDER — LOSARTAN POTASSIUM 25 MG PO TABS
25.0000 mg | ORAL_TABLET | Freq: Every day | ORAL | 3 refills | Status: DC
Start: 2020-04-09 — End: 2020-06-03

## 2020-04-09 MED ORDER — METOPROLOL SUCCINATE ER 25 MG PO TB24
12.5000 mg | ORAL_TABLET | Freq: Every day | ORAL | 3 refills | Status: DC
Start: 2020-04-09 — End: 2020-12-26
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
## 2020-04-09 NOTE — Patient Instructions (Addendum)
Medication Instructions:  Your physician has recommended you make the following change in your medication:  1. START Metoprolol succinate 25 mg take 1/2 tablet (12.5 mg) once daily. 2. START Losartan 25 mg once daily  3. Please take over the counter Iron pill once a day  miralex over the counter as needed for constipation  If you need a refill on your cardiac medications before your next appointment, please call your pharmacy.    Lab work: No new labs needed   If you have labs (blood work) drawn today and your tests are completely normal, you will receive your results only by: Marland Kitchen MyChart Message (if you have MyChart) OR . A paper copy in the mail If you have any lab test that is abnormal or we need to change your treatment, we will call you to review the results.   Testing/Procedures: No new testing needed   Follow-Up: At [facility], you and your health needs are our priority.  As part of our continuing mission to provide you with exceptional heart care, we have created designated Provider Care Teams.  These Care Teams include your primary Cardiologist (physician) and Advanced Practice Providers (APPs -  Physician Assistants and Nurse Practitioners) who all work together to provide you with the care you need, when you need it.  . You will need a follow up appointment in 6 months .   Marland Kitchen Providers on your designated Care Team:   . Murray Hodgkins, NP . Christell Faith, PA-C . Marrianne Mood, PA-C  Any Other Special Instructions Will Be Listed Below (If Applicable).  For educational health videos Log in to : [email or web address] Or : [email or web address], password : [email or web address]

## 2020-04-11 ENCOUNTER — Ambulatory Visit (INDEPENDENT_AMBULATORY_CARE_PROVIDER_SITE_OTHER): Payer: Medicare Other | Admitting: Internal Medicine

## 2020-04-11 ENCOUNTER — Other Ambulatory Visit (INDEPENDENT_AMBULATORY_CARE_PROVIDER_SITE_OTHER): Payer: Medicare Other

## 2020-04-11 ENCOUNTER — Encounter: Payer: Self-pay | Admitting: Internal Medicine

## 2020-04-11 VITALS — BP 134/60 | HR 72 | Ht 66.5 in | Wt 179.5 lb

## 2020-04-11 DIAGNOSIS — I25118 Atherosclerotic heart disease of native coronary artery with other forms of angina pectoris: Secondary | ICD-10-CM

## 2020-04-11 DIAGNOSIS — K260 Acute duodenal ulcer with hemorrhage: Secondary | ICD-10-CM

## 2020-04-11 DIAGNOSIS — K5641 Fecal impaction: Secondary | ICD-10-CM | POA: Diagnosis not present

## 2020-04-11 DIAGNOSIS — K5900 Constipation, unspecified: Secondary | ICD-10-CM

## 2020-04-11 DIAGNOSIS — R0789 Other chest pain: Secondary | ICD-10-CM | POA: Insufficient documentation

## 2020-04-11 DIAGNOSIS — D62 Acute posthemorrhagic anemia: Secondary | ICD-10-CM

## 2020-04-11 LAB — CBC WITH DIFFERENTIAL/PLATELET
Basophils Absolute: 0 10*3/uL (ref 0.0–0.1)
Basophils Relative: 0.5 % (ref 0.0–3.0)
Eosinophils Absolute: 0.2 10*3/uL (ref 0.0–0.7)
Eosinophils Relative: 2.5 % (ref 0.0–5.0)
HCT: 33 % — ABNORMAL LOW (ref 39.0–52.0)
Hemoglobin: 10.5 g/dL — ABNORMAL LOW (ref 13.0–17.0)
Lymphocytes Relative: 22 % (ref 12.0–46.0)
Lymphs Abs: 1.7 10*3/uL (ref 0.7–4.0)
MCHC: 31.9 g/dL (ref 30.0–36.0)
MCV: 93.5 fl (ref 78.0–100.0)
Monocytes Absolute: 0.6 10*3/uL (ref 0.1–1.0)
Monocytes Relative: 7.7 % (ref 3.0–12.0)
Neutro Abs: 5.1 10*3/uL (ref 1.4–7.7)
Neutrophils Relative %: 67.3 % (ref 43.0–77.0)
Platelets: 206 10*3/uL (ref 150.0–400.0)
RBC: 3.53 Mil/uL — ABNORMAL LOW (ref 4.22–5.81)
RDW: 15.4 % (ref 11.5–15.5)
WBC: 7.6 10*3/uL (ref 4.0–10.5)

## 2020-04-11 LAB — VITAMIN B12: Vitamin B-12: 295 pg/mL (ref 211–911)

## 2020-04-11 LAB — FERRITIN: Ferritin: 28.4 ng/mL (ref 22.0–322.0)

## 2020-04-11 NOTE — Assessment & Plan Note (Signed)
With nocturia noted.  See notes on labs.  No change in meds at this point.

## 2020-04-11 NOTE — Assessment & Plan Note (Signed)
Would avoid NSAIDs.  Unclear if this is related to musculoskeletal strain related to caring for his wife.  No symptoms now.  No exertional symptoms.  Tylenol helps.  Reasonable to continue as is for now, with as needed Tylenol and observation.

## 2020-04-11 NOTE — Assessment & Plan Note (Signed)
Follow-up labs pending.  See notes on labs.
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
## 2020-04-11 NOTE — Patient Instructions (Signed)
Your provider has requested that you go to the basement level for lab work before leaving today. Press "B" on the elevator. The lab is located at the first door on the left as you exit the elevator.   Please take 1/2 of the Plenvu kit we are providing you with today. Once you clean out your bowels start taking a dose of miralax daily.   I appreciate the opportunity to care for you. Carl Gessner, MD, FACG 

## 2020-04-12 ENCOUNTER — Ambulatory Visit: Payer: Medicare Other

## 2020-04-12 ENCOUNTER — Telehealth: Payer: Self-pay

## 2020-04-12 ENCOUNTER — Other Ambulatory Visit: Payer: Self-pay

## 2020-04-12 MED ORDER — IRON 325 (65 FE) MG PO TABS: 325.0000 mg | ORAL_TABLET | ORAL | 0 refills | Status: DC

## 2020-04-12 MED ORDER — PANTOPRAZOLE SODIUM 40 MG PO TBEC: 40.0000 mg | DELAYED_RELEASE_TABLET | Freq: Every day | ORAL | 3 refills | Status: DC

## 2020-04-12 NOTE — Telephone Encounter (Signed)
Called 3 times trying to complete Medicare Wellness visit. Patient never answered. Unable to leave a message because voicemail is not setup.

## 2020-04-22 DIAGNOSIS — N1831 Chronic kidney disease, stage 3a: Secondary | ICD-10-CM

## 2020-04-22 DIAGNOSIS — F1010 Alcohol abuse, uncomplicated: Secondary | ICD-10-CM

## 2020-04-22 DIAGNOSIS — I5032 Chronic diastolic (congestive) heart failure: Secondary | ICD-10-CM

## 2020-04-22 DIAGNOSIS — I272 Pulmonary hypertension, unspecified: Secondary | ICD-10-CM

## 2020-04-22 DIAGNOSIS — I700 Atherosclerosis of aorta: Secondary | ICD-10-CM

## 2020-04-22 DIAGNOSIS — I4510 Unspecified right bundle-branch block: Secondary | ICD-10-CM

## 2020-04-22 DIAGNOSIS — E782 Mixed hyperlipidemia: Secondary | ICD-10-CM

## 2020-04-22 DIAGNOSIS — I719 Aortic aneurysm of unspecified site, without rupture: Secondary | ICD-10-CM

## 2020-04-22 DIAGNOSIS — E1151 Type 2 diabetes mellitus with diabetic peripheral angiopathy without gangrene: Secondary | ICD-10-CM

## 2020-04-22 DIAGNOSIS — M1990 Unspecified osteoarthritis, unspecified site: Secondary | ICD-10-CM

## 2020-04-22 DIAGNOSIS — M109 Gout, unspecified: Secondary | ICD-10-CM

## 2020-04-22 DIAGNOSIS — K5900 Constipation, unspecified: Secondary | ICD-10-CM

## 2020-04-22 DIAGNOSIS — J439 Emphysema, unspecified: Secondary | ICD-10-CM

## 2020-04-22 DIAGNOSIS — N401 Enlarged prostate with lower urinary tract symptoms: Secondary | ICD-10-CM

## 2020-04-22 DIAGNOSIS — K254 Chronic or unspecified gastric ulcer with hemorrhage: Secondary | ICD-10-CM | POA: Diagnosis not present

## 2020-04-22 DIAGNOSIS — R001 Bradycardia, unspecified: Secondary | ICD-10-CM

## 2020-04-22 DIAGNOSIS — K5730 Diverticulosis of large intestine without perforation or abscess without bleeding: Secondary | ICD-10-CM

## 2020-04-22 DIAGNOSIS — E7800 Pure hypercholesterolemia, unspecified: Secondary | ICD-10-CM

## 2020-04-22 DIAGNOSIS — D631 Anemia in chronic kidney disease: Secondary | ICD-10-CM

## 2020-04-22 DIAGNOSIS — I25118 Atherosclerotic heart disease of native coronary artery with other forms of angina pectoris: Secondary | ICD-10-CM

## 2020-04-22 DIAGNOSIS — I051 Rheumatic mitral insufficiency: Secondary | ICD-10-CM

## 2020-04-22 DIAGNOSIS — I444 Left anterior fascicular block: Secondary | ICD-10-CM

## 2020-04-22 DIAGNOSIS — E1122 Type 2 diabetes mellitus with diabetic chronic kidney disease: Secondary | ICD-10-CM

## 2020-04-22 DIAGNOSIS — M549 Dorsalgia, unspecified: Secondary | ICD-10-CM

## 2020-04-22 DIAGNOSIS — I130 Hypertensive heart and chronic kidney disease with heart failure and stage 1 through stage 4 chronic kidney disease, or unspecified chronic kidney disease: Secondary | ICD-10-CM | POA: Diagnosis not present

## 2020-05-02 ENCOUNTER — Other Ambulatory Visit: Payer: Self-pay | Admitting: Cardiovascular Disease

## 2020-05-06 ENCOUNTER — Telehealth: Payer: Self-pay | Admitting: Family Medicine

## 2020-05-06 NOTE — Telephone Encounter (Signed)
Erline Levine, With [facility] called requesting approval to extended PT   1 time a week for 2 week  Erline Levine stated patient had a fall and they'd like to extend his PT    Ph- [phone]

## 2020-05-07 ENCOUNTER — Ambulatory Visit: Payer: Medicare Other | Admitting: Internal Medicine

## 2020-05-07 NOTE — Telephone Encounter (Signed)
Please give the order.  Thanks.   

## 2020-05-07 NOTE — Telephone Encounter (Signed)
Spoke to [place] with [facility] and advised her that Dr. Damita Dunnings extended patients PT for once a week, for 2 weeks.

## 2020-05-13 ENCOUNTER — Other Ambulatory Visit: Payer: Self-pay | Admitting: Family Medicine

## 2020-05-13 NOTE — Telephone Encounter (Signed)
PT SAID HE WAS TOLD TO TAKE 2 TABLETS A DAY. CAN WE GET NEW RX WITH UPDATED SIG FOR 1 TABLET BID.

## 2020-05-14 NOTE — Telephone Encounter (Signed)
New prescription sent.  If that is not helping then let us know.  Thanks.

## 2020-06-03 ENCOUNTER — Encounter: Payer: Self-pay | Admitting: Family Medicine

## 2020-06-03 ENCOUNTER — Other Ambulatory Visit: Payer: Self-pay

## 2020-06-03 ENCOUNTER — Other Ambulatory Visit: Payer: Self-pay | Admitting: Family Medicine

## 2020-06-03 ENCOUNTER — Ambulatory Visit (INDEPENDENT_AMBULATORY_CARE_PROVIDER_SITE_OTHER): Payer: Medicare Other | Admitting: Family Medicine

## 2020-06-03 VITALS — BP 118/94 | HR 72 | Temp 98.5°F | Ht 66.5 in | Wt 178.5 lb

## 2020-06-03 DIAGNOSIS — K5900 Constipation, unspecified: Secondary | ICD-10-CM

## 2020-06-03 DIAGNOSIS — I25118 Atherosclerotic heart disease of native coronary artery with other forms of angina pectoris: Secondary | ICD-10-CM | POA: Diagnosis not present

## 2020-06-03 DIAGNOSIS — J479 Bronchiectasis, uncomplicated: Secondary | ICD-10-CM

## 2020-06-03 DIAGNOSIS — M109 Gout, unspecified: Secondary | ICD-10-CM

## 2020-06-03 DIAGNOSIS — I10 Essential (primary) hypertension: Secondary | ICD-10-CM

## 2020-06-03 DIAGNOSIS — M549 Dorsalgia, unspecified: Secondary | ICD-10-CM | POA: Diagnosis not present

## 2020-06-03 LAB — CBC WITH DIFFERENTIAL/PLATELET
Basophils Absolute: 0 10*3/uL (ref 0.0–0.1)
Basophils Relative: 0.6 % (ref 0.0–3.0)
Eosinophils Absolute: 0.1 10*3/uL (ref 0.0–0.7)
Eosinophils Relative: 1.1 % (ref 0.0–5.0)
HCT: 32.6 % — ABNORMAL LOW (ref 39.0–52.0)
Hemoglobin: 10.6 g/dL — ABNORMAL LOW (ref 13.0–17.0)
Lymphocytes Relative: 16.3 % (ref 12.0–46.0)
Lymphs Abs: 1.3 10*3/uL (ref 0.7–4.0)
MCHC: 32.6 g/dL (ref 30.0–36.0)
MCV: 92.1 fl (ref 78.0–100.0)
Monocytes Absolute: 0.6 10*3/uL (ref 0.1–1.0)
Monocytes Relative: 8.1 % (ref 3.0–12.0)
Neutro Abs: 5.9 10*3/uL (ref 1.4–7.7)
Neutrophils Relative %: 73.9 % (ref 43.0–77.0)
Platelets: 172 10*3/uL (ref 150.0–400.0)
RBC: 3.54 Mil/uL — ABNORMAL LOW (ref 4.22–5.81)
RDW: 15.7 % — ABNORMAL HIGH (ref 11.5–15.5)
WBC: 7.9 10*3/uL (ref 4.0–10.5)

## 2020-06-03 LAB — BASIC METABOLIC PANEL
BUN: 24 mg/dL — ABNORMAL HIGH (ref 6–23)
CO2: 29 mEq/L (ref 19–32)
Calcium: 9.7 mg/dL (ref 8.4–10.5)
Chloride: 102 mEq/L (ref 96–112)
Creatinine, Ser: 1.27 mg/dL (ref 0.40–1.50)
GFR: 53.6 mL/min — ABNORMAL LOW (ref >60.00)
Glucose, Bld: 132 mg/dL — ABNORMAL HIGH (ref 70–99)
Potassium: 5.1 mEq/L (ref 3.5–5.1)
Sodium: 139 mEq/L (ref 135–145)

## 2020-06-03 LAB — URIC ACID: Uric Acid, Serum: 6.1 mg/dL (ref 4.0–7.8)

## 2020-06-03 MED ORDER — FLOVENT HFA 110 MCG/ACT IN AERO
2.0000 | INHALATION_SPRAY | Freq: Two times a day (BID) | RESPIRATORY_TRACT | 12 refills | Status: DC
Start: 2020-06-03 — End: 2021-07-02

## 2020-06-03 MED ORDER — POLYETHYLENE GLYCOL 3350 17 GM/SCOOP PO POWD
17.0000 g | Freq: Two times a day (BID) | ORAL | Status: DC | PRN
Start: 2020-06-03 — End: 2021-07-02

## 2020-06-03 MED ORDER — LOSARTAN POTASSIUM 25 MG PO TABS
ORAL_TABLET | ORAL | Status: DC
Start: 2020-06-03 — End: 2020-10-08

## 2020-06-03 MED ORDER — TAMSULOSIN HCL 0.4 MG PO CAPS
0.4000 mg | ORAL_CAPSULE | Freq: Every day | ORAL | Status: DC
Start: 2020-06-03 — End: 2020-11-08

## 2020-06-03 NOTE — Telephone Encounter (Signed)
Patient is requesting 180 tabs of Pantoprazole.   Please advise

## 2020-06-03 NOTE — Telephone Encounter (Signed)
Sent. Thanks.   

## 2020-06-03 NOTE — Patient Instructions (Signed)
Stop losartan for now and see if the lightheadedness gets better.    Use albuterol if needed but start flovent in the meantime.  Rinse after use.  See if that helps with your breathing.    Take miralax twice a day for now and see if that helps with constipation.  Update me as needed.    Go to the lab on the way out.   If you have mychart we'll likely use that to update you.     Okay to take tylenol for back pain.  I would avoid ibuprofen, aleve, indomethacin.    Take care.  Glad to see you.

## 2020-06-03 NOTE — Progress Notes (Signed)
This visit occurred during the SARS-CoV-2 public health emergency.  Safety protocols were in place, including screening questions prior to the visit, additional usage of staff PPE, and extensive cleaning of exam room while observing appropriate contact time as indicated for disinfecting solutions.  Constipation.  He has to disimpact himself occ.  He has been using smooth move in the meantime, then recently changed from that to miralax daily.  Last BM 4 days ago.  He is off iron.  No abdominal pain.  No recent gout flares.  Due for labs.  Compliant with alloprinol.  Off indomethacin.    Has had intermittent back pain.  He strained his back helping his wife when she almost fell.  He caught her but he has had lower back pain in the meantime.  His pain is better in the meantime but not resolved fully.  Taking tylenol prn in the meantime.    SOB noted with walking.  Using cane at baseline.  His exertional capacity is lower than prev, ie walking into clinic.  He is taking furosemide 40mg  daily.  His swelling is controlled.  SABA helps some.  Still with some occ wheeze.  He is intermittently lightheaded on standing.    He still has post nasal gtt.  This is longstanding.  We deferred treatment on this given the above.  He kindly asked about my family today, per routine for this patient.    Meds, vitals, and allergies reviewed.   ROS: Per HPI unless specifically indicated in ROS section   GEN: nad, alert and oriented HEENT: ncat NECK: supple w/o LA CV: rrr.  PULM: ctab, no inc wob ABD: soft, +bs EXT: no edema SKIN: no acute rash but senile purpura noted. Back not tender to palpation in the midline. Able to bear weight.  At least 30 minutes were devoted to patient care in this encounter (this can potentially include time spent reviewing the patient's file/history, interviewing and examining the patient, counseling/reviewing plan with patient, ordering referrals, ordering tests, reviewing relevant  laboratory or x-ray data, and documenting the encounter).

## 2020-06-05 DIAGNOSIS — M549 Dorsalgia, unspecified: Secondary | ICD-10-CM | POA: Insufficient documentation

## 2020-06-05 DIAGNOSIS — K5900 Constipation, unspecified: Secondary | ICD-10-CM | POA: Insufficient documentation

## 2020-06-05 NOTE — Assessment & Plan Note (Signed)
Discussed options.  Add on Flovent with routine cautions.  Continue using albuterol as needed.  He will update me to see if the Flovent makes any difference in his breathing.

## 2020-06-05 NOTE — Assessment & Plan Note (Signed)
Discussed staying on a regular bowel regimen.  He can use MiraLAX daily, twice a day if needed.  He will use that in the meantime and update me as needed.

## 2020-06-05 NOTE — Assessment & Plan Note (Addendum)
No recent gout flares.  Due for labs.  Compliant with alloprinol.  Off indomethacin.  No change in meds.  See notes on labs.

## 2020-06-05 NOTE — Assessment & Plan Note (Signed)
He can get lightheaded.  Discussed stopping losartan.  Unclear if lower blood pressure is contributing to his decreased exercise capacity.

## 2020-06-05 NOTE — Assessment & Plan Note (Signed)
Likely a benign muscle strain that is improving and he can use Tylenol in the meantime.

## 2020-06-23 ENCOUNTER — Encounter: Payer: Self-pay | Admitting: Family Medicine

## 2020-09-11 ENCOUNTER — Other Ambulatory Visit: Payer: Self-pay | Admitting: Family Medicine
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
## 2020-10-08 ENCOUNTER — Encounter: Payer: Self-pay | Admitting: Cardiovascular Disease

## 2020-10-08 ENCOUNTER — Ambulatory Visit (INDEPENDENT_AMBULATORY_CARE_PROVIDER_SITE_OTHER): Payer: Medicare Other | Admitting: Cardiovascular Disease

## 2020-10-08 ENCOUNTER — Other Ambulatory Visit: Payer: Self-pay

## 2020-10-08 VITALS — BP 106/50 | HR 63 | Ht 66.0 in | Wt 173.0 lb

## 2020-10-08 DIAGNOSIS — I5032 Chronic diastolic (congestive) heart failure: Secondary | ICD-10-CM | POA: Diagnosis not present

## 2020-10-08 DIAGNOSIS — E782 Mixed hyperlipidemia: Secondary | ICD-10-CM | POA: Diagnosis not present

## 2020-10-08 DIAGNOSIS — I739 Peripheral vascular disease, unspecified: Secondary | ICD-10-CM | POA: Diagnosis not present

## 2020-10-08 DIAGNOSIS — M791 Myalgia, unspecified site: Secondary | ICD-10-CM

## 2020-10-08 DIAGNOSIS — I77810 Thoracic aortic ectasia: Secondary | ICD-10-CM | POA: Diagnosis not present

## 2020-10-08 DIAGNOSIS — R0602 Shortness of breath: Secondary | ICD-10-CM

## 2020-10-08 DIAGNOSIS — I272 Pulmonary hypertension, unspecified: Secondary | ICD-10-CM

## 2020-10-08 DIAGNOSIS — I25118 Atherosclerotic heart disease of native coronary artery with other forms of angina pectoris: Secondary | ICD-10-CM | POA: Diagnosis not present

## 2020-10-08 DIAGNOSIS — T466X5A Adverse effect of antihyperlipidemic and antiarteriosclerotic drugs, initial encounter: Secondary | ICD-10-CM

## 2020-10-08 NOTE — Patient Instructions (Addendum)
For mucus: Try zyrtec, mucinex, flonase   Medication Instructions:  No changes  If you need a refill on your cardiac medications before your next appointment, please call your pharmacy.    Lab work: No new labs needed   If you have labs (blood work) drawn today and your tests are completely normal, you will receive your results only by: Marland Kitchen MyChart Message (if you have MyChart) OR . A paper copy in the mail If you have any lab test that is abnormal or we need to change your treatment, we will call you to review the results.   Testing/Procedures: No new testing needed   Follow-Up: At [facility], you and your health needs are our priority.  As part of our continuing mission to provide you with exceptional heart care, we have created designated Provider Care Teams.  These Care Teams include your primary Cardiologist (physician) and Advanced Practice Providers (APPs -  Physician Assistants and Nurse Practitioners) who all work together to provide you with the care you need, when you need it.  . You will need a follow up appointment in 12 months  . Providers on your designated Care Team:   . Murray Hodgkins, NP . Christell Faith, PA-C . Marrianne Mood, PA-C  Any Other Special Instructions Will Be Listed Below (If Applicable).  COVID-19 Vaccine Information can be found at: ShippingScam.co.uk For questions related to vaccine distribution or appointments, please email [email or web address] or call [phone].

## 2020-10-09 ENCOUNTER — Other Ambulatory Visit: Payer: Self-pay | Admitting: Family Medicine

## 2020-10-09 DIAGNOSIS — D649 Anemia, unspecified: Secondary | ICD-10-CM

## 2020-10-15 ENCOUNTER — Other Ambulatory Visit: Payer: Self-pay | Admitting: Family Medicine

## 2020-11-08 ENCOUNTER — Encounter: Payer: Self-pay | Admitting: Family Medicine

## 2020-11-08 ENCOUNTER — Ambulatory Visit (INDEPENDENT_AMBULATORY_CARE_PROVIDER_SITE_OTHER): Payer: Medicare Other | Admitting: Family Medicine

## 2020-11-08 ENCOUNTER — Other Ambulatory Visit: Payer: Self-pay

## 2020-11-08 VITALS — BP 142/62 | HR 68 | Temp 96.4°F | Ht 66.0 in | Wt 173.1 lb

## 2020-11-08 DIAGNOSIS — D649 Anemia, unspecified: Secondary | ICD-10-CM

## 2020-11-08 DIAGNOSIS — I25118 Atherosclerotic heart disease of native coronary artery with other forms of angina pectoris: Secondary | ICD-10-CM | POA: Diagnosis not present

## 2020-11-08 DIAGNOSIS — J418 Mixed simple and mucopurulent chronic bronchitis: Secondary | ICD-10-CM | POA: Diagnosis not present

## 2020-11-08 DIAGNOSIS — J310 Chronic rhinitis: Secondary | ICD-10-CM | POA: Diagnosis not present

## 2020-11-08 DIAGNOSIS — R0600 Dyspnea, unspecified: Secondary | ICD-10-CM

## 2020-11-08 DIAGNOSIS — R0602 Shortness of breath: Secondary | ICD-10-CM

## 2020-11-08 LAB — CBC WITH DIFFERENTIAL/PLATELET
Basophils Absolute: 0 10*3/uL (ref 0.0–0.1)
Basophils Relative: 0.7 % (ref 0.0–3.0)
Eosinophils Absolute: 0.1 10*3/uL (ref 0.0–0.7)
Eosinophils Relative: 1.9 % (ref 0.0–5.0)
HCT: 32.3 % — ABNORMAL LOW (ref 39.0–52.0)
Hemoglobin: 10.6 g/dL — ABNORMAL LOW (ref 13.0–17.0)
Lymphocytes Relative: 15.4 % (ref 12.0–46.0)
Lymphs Abs: 1.1 10*3/uL (ref 0.7–4.0)
MCHC: 32.8 g/dL (ref 30.0–36.0)
MCV: 94.3 fl (ref 78.0–100.0)
Monocytes Absolute: 0.5 10*3/uL (ref 0.1–1.0)
Monocytes Relative: 6.9 % (ref 3.0–12.0)
Neutro Abs: 5.4 10*3/uL (ref 1.4–7.7)
Neutrophils Relative %: 75.1 % (ref 43.0–77.0)
Platelets: 166 10*3/uL (ref 150.0–400.0)
RBC: 3.43 Mil/uL — ABNORMAL LOW (ref 4.22–5.81)
RDW: 14.4 % (ref 11.5–15.5)
WBC: 7.2 10*3/uL (ref 4.0–10.5)

## 2020-11-08 LAB — COMPREHENSIVE METABOLIC PANEL
ALT: 7 U/L (ref 0–53)
AST: 14 U/L (ref 0–37)
Albumin: 3.7 g/dL (ref 3.5–5.2)
Alkaline Phosphatase: 53 U/L (ref 39–117)
BUN: 22 mg/dL (ref 6–23)
CO2: 30 mEq/L (ref 19–32)
Calcium: 9.1 mg/dL (ref 8.4–10.5)
Chloride: 102 mEq/L (ref 96–112)
Creatinine, Ser: 1.1 mg/dL (ref 0.40–1.50)
GFR: 60.26 mL/min (ref >60.00)
Glucose, Bld: 135 mg/dL — ABNORMAL HIGH (ref 70–99)
Potassium: 4.5 mEq/L (ref 3.5–5.1)
Sodium: 137 mEq/L (ref 135–145)
Total Bilirubin: 0.5 mg/dL (ref 0.2–1.2)
Total Protein: 6.5 g/dL (ref 6.0–8.3)

## 2020-11-08 LAB — IRON: Iron: 87 ug/dL (ref 42–165)

## 2020-11-08 LAB — BRAIN NATRIURETIC PEPTIDE: Pro B Natriuretic peptide (BNP): 482 pg/mL — ABNORMAL HIGH (ref 0.0–100.0)

## 2020-11-08 MED ORDER — FUROSEMIDE 40 MG PO TABS
40.0000 mg | ORAL_TABLET | Freq: Every day | ORAL | Status: DC
Start: 2020-11-08 — End: 2021-03-31

## 2020-11-08 NOTE — Patient Instructions (Addendum)
Let me see about options for your inhalers and runny nose.  Go to the lab on the way out.   If you have mychart we'll likely use that to update you.   Take care.  Glad to see you.

## 2020-11-08 NOTE — Progress Notes (Signed)
This visit occurred during the SARS-CoV-2 public health emergency.  Safety protocols were in place, including screening questions prior to the visit, additional usage of staff PPE, and extensive cleaning of exam room while observing appropriate contact time as indicated for disinfecting solutions.  Anemia f/u. Due for labs.  Not on iron.  Cramping in the legs is better in the meantime.  See notes on labs.  SOB potentially exacerbated by anemia.  He is off flovent due to cost and that also could contribute to his shortness of breath.  Needing SABA daily.    He still has rhinorrhea/post nasal gtt, esp in the AM when he gets up.  No fevers.  I told him I will check on options for rhinorrhea, since he has failed Flonase and Atrovent previously.  Walking with quad cane at baseline.  He feels better walking with that, more stable. His wife needs more help.  She is chronically ill with charcot foot.  D/w pt.  He also has two elderly dogs that are not in good health.   He kindly asks about my family, per routine.     Meds, vitals, and allergies reviewed.   ROS: Per HPI unless specifically indicated in ROS section   nad ncat Neck supple, no lymphadenopathy rrr ctab Abdomen soft, nontender No edema.   Skin well perfused.  30 minutes were devoted to patient care in this encounter (this includes time spent reviewing the patient's file/history, interviewing and examining the patient, counseling/reviewing plan with patient).

## 2020-11-10 ENCOUNTER — Other Ambulatory Visit: Payer: Self-pay | Admitting: Family Medicine

## 2020-11-10 DIAGNOSIS — D649 Anemia, unspecified: Secondary | ICD-10-CM

## 2020-11-10 DIAGNOSIS — R0600 Dyspnea, unspecified: Secondary | ICD-10-CM

## 2020-11-10 NOTE — Assessment & Plan Note (Signed)
Likely multifactorial dyspnea.  We will try to see if we can get him an alternative inhaler that will be effective and affordable.  See notes on labs regarding anemia.  Discussed with patient about differential diagnosis.

## 2020-11-10 NOTE — Assessment & Plan Note (Signed)
I told him I will check on options for rhinorrhea, since he has failed Flonase and Atrovent previously.

## 2020-11-10 NOTE — Assessment & Plan Note (Signed)
Likely multifactorial shortness of breath compounded by being off Flovent due to cost.  I will ask for help with this through pharmacy.  I appreciate the help of all involved.

## 2020-11-10 NOTE — Assessment & Plan Note (Signed)
Not on iron.  Due for follow-up labs.  See notes on labs.

## 2020-11-11 ENCOUNTER — Telehealth: Payer: Self-pay | Admitting: Family Medicine

## 2020-11-11 NOTE — Chronic Care Management (AMB) (Signed)
  Chronic Care Management   Note  11/11/2020 Name: Christopher Santiago MRN: [id number] DOB: 06-Sep-1931  Christopher Santiago is a 86 y.o. year old male who is a primary care patient of Tonia Ghent, MD. I reached out to [name] by phone today in response to a referral sent by Christopher Santiago's PCP, Tonia Ghent, MD.   Christopher Santiago was given information about Chronic Care Management services today including:  1. CCM service includes personalized support from designated clinical staff supervised by his physician, including individualized plan of care and coordination with other care providers 2. 24/7 contact phone numbers for assistance for urgent and routine care needs. 3. Service will only be billed when office clinical staff spend 20 minutes or more in a month to coordinate care. 4. Only one practitioner may furnish and bill the service in a calendar month. 5. The patient may stop CCM services at any time (effective at the end of the month) by phone call to the office staff.   [name] verbally agreed to assistance and services provided by embedded care coordination/care management team today.  Follow up plan:   [place]

## 2020-11-11 NOTE — Progress Notes (Signed)
  Chronic Care Management   Outreach Note  11/11/2020 Name: Christopher Santiago MRN: [id number] DOB: 11/10/1931  Referred by: Tonia Ghent, MD Reason for referral : Chronic Care Management   An unsuccessful telephone outreach was attempted today. The patient was referred to the pharmacist for assistance with care management and care coordination.   Follow Up Plan:   Hilario Quarry  Upstream Scheduler

## 2020-11-19 ENCOUNTER — Ambulatory Visit: Payer: Medicare Other

## 2020-11-19 ENCOUNTER — Other Ambulatory Visit: Payer: Self-pay

## 2020-11-19 DIAGNOSIS — J418 Mixed simple and mucopurulent chronic bronchitis: Secondary | ICD-10-CM

## 2020-11-19 DIAGNOSIS — I10 Essential (primary) hypertension: Secondary | ICD-10-CM

## 2020-11-19 NOTE — Chronic Care Management (AMB) (Signed)
Chronic Care Management Pharmacy  Name: Christopher Santiago  MRN: [id number] DOB: [birth year]  Chief Complaint/ HPI  Christopher Santiago,  86 y.o. , male presents for their Initial CCM visit with the clinical pharmacist via telephone due to COVID-19 Pandemic.  PCP : Christopher Ghent, MD  Their chronic conditions include: PVD, CAD, HISTORY OF DVT/PE, HTN, HLD, DM, COPD, OSA, diastolic CHF  Office Visits:   11/08/20: Anemia, due for labs, not on iron; SOB potentially exacerbated by anemia.  He is off flovent due to cost and that also could contribute to his shortness of breath.  Needing SABA daily. He still has rhinorrhea/post nasal gtt, esp in the AM when he gets up.  No fevers.  I told him I will check on options for rhinorrhea, since he has failed Flonase and Atrovent previously.  06/23/20: Pt message - Asking if he is supposed to be taking meloxicam 25 mg  06/03/20: Constipation, MiraLAX daily; no recent gout flares, off indomethacin; intermittent back pain, on Tylenol; SOB, SABA helps some, on lasix daily   Consult Visit:  10/08/20: Cardiology - Stopped many of his medications not on losrtan, not on lasix, not on simvastatin (myalgias), COPD, unable to afford steroid inhalers, declines pulmonology, history of DVT has IVC filter, not on anticoagulation, OSA, compliant CPAP, HTN, improved likely due to weight loss. GI bleed/anemia - avoid NSAIDs, avoid alcohol, for mucous - try Zyrtec, Mucinex and Flonase   Medications: Outpatient Encounter Medications as of 11/19/2020  Medication Sig  . acetaminophen (TYLENOL) 325 MG tablet Take 2 tablets (650 mg total) by mouth every 6 (six) hours as needed for mild pain (or Fever >/= 101).  Marland Kitchen albuterol (VENTOLIN HFA) 108 (90 Base) MCG/ACT inhaler Inhale 1-2 puffs into the lungs every 6 (six) hours as needed for wheezing or shortness of breath (okay to fill with albuterol/proair/ventolin).  Marland Kitchen allopurinol (ZYLOPRIM) 100 MG tablet TAKE 1 TABLET BY MOUTH ONCE A DAY   . aspirin EC 81 MG tablet Take 81 mg by mouth daily.  . Cholecalciferol 2000 units CAPS Take 2,000 Units by mouth daily.   . citalopram (CELEXA) 40 MG tablet TAKE 1 AND 1/2 TABLETS BY MOUTH ONCE A DAY  . DOK 100 MG capsule TAKE 1 CAPSULE BY MOUTH TWICE DAILY  . fluticasone (FLOVENT HFA) 110 MCG/ACT inhaler Inhale 2 puffs into the lungs in the morning and at bedtime. Rinse after use.  . furosemide (LASIX) 40 MG tablet Take 1 tablet (40 mg total) by mouth daily.  Marland Kitchen losartan (COZAAR) 25 MG tablet Take 25 mg by mouth daily.  . metoprolol succinate (TOPROL XL) 25 MG 24 hr tablet Take 0.5 tablets (12.5 mg total) by mouth daily.  . pantoprazole (PROTONIX) 40 MG tablet TAKE 1 TABLET BY MOUTH TWICE A DAY BEFORE A MEAL  . polyethylene glycol powder (GLYCOLAX/MIRALAX) 17 GM/SCOOP powder Take 17 g by mouth 2 (two) times daily as needed.   No facility-administered encounter medications on file as of 11/19/2020.    Current Diagnosis/Assessment:  SDOH Interventions   Flowsheet Row Most Recent Value  SDOH Interventions   Financial Strain Interventions Other (Comment)  [Formulary review]      Hypertension   CMP Latest Ref Rng & Units 11/08/2020 06/03/2020 04/01/2020  Glucose 70 - 99 mg/dL 135(H) 132(H) 83  BUN 6 - 23 mg/dL 22 24(H) 31(H)  Creatinine 0.40 - 1.50 mg/dL 1.10 1.27 1.07  Sodium 135 - 145 mEq/L 137 139 135  Potassium 3.5 - 5.1  mEq/L 4.5 5.1 4.9  Chloride 96 - 112 mEq/L 102 102 100  CO2 19 - 32 mEq/L 30 29 28   Calcium 8.4 - 10.5 mg/dL 9.1 9.7 9.7  Total Protein 6.0 - 8.3 g/dL 6.5 - 7.3  Total Bilirubin 0.2 - 1.2 mg/dL 0.5 - 0.5  Alkaline Phos 39 - 117 U/L 53 - 43  AST 0 - 37 U/L 14 - 17  ALT 0 - 53 U/L 7 - 10   Office blood pressures are: BP Readings from Last 3 Encounters:  11/08/20 (!) 142/62  10/08/20 (!) 106/50  06/03/20 (!) 118/94   BP goal < 140/90 mmHg  Patient has failed these meds in the past: none reported Patient checks BP at home: none   Per PCP 05/2020 - Stop  losartan for now to see if lightheadedness improves. Patient is currently controlled on the following medications:   Losartan 25 mg - 1 tablet daily  Metoprolol succinate 25 mg - 1 tablet daily  We discussed: Pt confirmed adherence to above medications, also confirmed by refill history. Denies any home BP monitoring. Clinic reading variable, but adequate control. CMP WNL.  Plan: Continue current medications  COPD / Tobacco   Last spirometry score: Post-broncholdilator FEV1 2017 69%  Gold Grade: Gold 2 (FEV1 50-79%) Current COPD Classification:  B (high sx, <2 exacerbations/yr)  Eosinophil count:   Lab Results  Component Value Date/Time   EOSPCT 1.9 11/08/2020 11:06 AM   EOSPCT 2.4 02/18/2015 04:23 PM  %                               Eos (Absolute):  Lab Results  Component Value Date/Time   EOSABS 0.1 11/08/2020 11:06 AM   EOSABS 0.2 02/18/2015 04:23 PM   Tobacco Status:  Social History   Tobacco Use  Smoking Status Former Smoker  . Packs/day: 1.50  . Years: 53.00  . Pack years: 79.50  . Types: Cigarettes  . Quit date: 11/23/1993  . Years since quitting: 27.0  Smokeless Tobacco Never Used   We discussed: Pt reports his main problem is SOB and phlegm in throat. Using albuterol up to 3-4 times per day. Most days 1-2 times. Albuterol provides instant relief from his phlegm. This allows him to do his daily activities. He started taking 2 Benadryl every day in the morning for the past 2 weeks. Denies noticeable improvement.   Reports he tried Mucinex OTC - denies any benefit, tried Flonase Nasal sprays but unsure does not recall if it helped, per chart no benefit. He does still have a prescription for Flonase. His wife reports the difficulty is getting him to use a nasal spray regularly. Reports drinking 2-3 bottles of water daily (12 oz each).  Patient has failed these meds in past: (All per chart, pt does not recall) Symbicort - throat irritation, cough; Spiriva - cost  concerns outweighed benefit (stopped 2017); Flonase nasal spray - ineffective; ipratropium nasal - ineffective; Perforomist nebulizer - ineffective, Mucinex Patient is currently uncontrolled on the following medications:   Albuterol - Inhale 2 puffs every 4-6 hours PRN  Using maintenance inhaler regularly? No - unable to afford Flovent Frequency of rescue inhaler use:  multiple times per day  We discussed: pt history of cost concerns, lack of benefit with recommended therapy for COPD - LABA/LAMA. Pt is open to retrial if medication is affordable. Considering his worsening symptoms, we discussed trying Stiolto or Trelegy pending insurance  formulary review.  Plan: Stop Benadryl. Start cetirizine 10 mg daily over the counter. Complete formulary review for lower cost COPD treatment.  Debbora Dus, PharmD Clinical Pharmacist [facility] [phone]

## 2020-12-25 ENCOUNTER — Other Ambulatory Visit: Payer: Self-pay | Admitting: Family Medicine

## 2020-12-26 ENCOUNTER — Other Ambulatory Visit: Payer: Self-pay | Admitting: Cardiovascular Disease

## 2020-12-26 ENCOUNTER — Other Ambulatory Visit: Payer: Self-pay | Admitting: Family Medicine

## 2021-01-10 ENCOUNTER — Ambulatory Visit (INDEPENDENT_AMBULATORY_CARE_PROVIDER_SITE_OTHER): Payer: Medicare Other

## 2021-01-10 ENCOUNTER — Other Ambulatory Visit: Payer: Self-pay

## 2021-01-10 DIAGNOSIS — Z0000 Encounter for general adult medical examination without abnormal findings: Secondary | ICD-10-CM | POA: Diagnosis not present
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
## 2021-01-10 NOTE — Progress Notes (Signed)
PCP notes:  Health Maintenance: No gaps noted   Abnormal Screenings: none   Patient concerns: Shortness of breath- this is a ongoing chronic issue per patient  Constant runny nose    Nurse concerns: none   Next PCP appt.: none

## 2021-01-10 NOTE — Patient Instructions (Signed)
[name] , Thank you for taking time to come for your Medicare Wellness Visit. I appreciate your ongoing commitment to your health goals. Please review the following plan we discussed and let me know if I can assist you in the future.   Screening recommendations/referrals: Colonoscopy: no longer required  Recommended yearly ophthalmology/optometry visit for glaucoma screening and checkup Recommended yearly dental visit for hygiene and checkup  Vaccinations: Influenza vaccine: Up to date, completed 08/23/2020, due 06/2021 Pneumococcal vaccine: Completed series Tdap vaccine: Up to date, completed 03/14/2013, due 02/2023 Shingles vaccine: due, check with your insurance regarding coverage if interested    Covid-19: Completed series  Advanced directives: Please bring a copy of your POA (Power of [place]) and/or Living Will to your next appointment.   Conditions/risks identified: hypertension, hyperlipidemia   Next appointment: Follow up in one year for your annual wellness visit.   Preventive Care 11 Years and Older, Male Preventive care refers to lifestyle choices and visits with your health care provider that can promote health and wellness. What does preventive care include?  A yearly physical exam. This is also called an annual well check.  Dental exams once or twice a year.  Routine eye exams. Ask your health care provider how often you should have your eyes checked.  Personal lifestyle choices, including:  Daily care of your teeth and gums.  Regular physical activity.  Eating a healthy diet.  Avoiding tobacco and drug use.  Limiting alcohol use.  Practicing safe sex.  Taking low doses of aspirin every day.  Taking vitamin and mineral supplements as recommended by your health care provider. What happens during an annual well check? The services and screenings done by your health care provider during your annual well check will depend on your age, overall health,  lifestyle risk factors, and family history of disease. Counseling  Your health care provider may ask you questions about your:  Alcohol use.  Tobacco use.  Drug use.  Emotional well-being.  Home and relationship well-being.  Sexual activity.  Eating habits.  History of falls.  Memory and ability to understand (cognition).  Work and work Statistician. Screening  You may have the following tests or measurements:  Height, weight, and BMI.  Blood pressure.  Lipid and cholesterol levels. These may be checked every 5 years, or more frequently if you are over 65 years old.  Skin check.  Lung cancer screening. You may have this screening every year starting at age 74 if you have a 30-pack-year history of smoking and currently smoke or have quit within the past 15 years.  Fecal occult blood test (FOBT) of the stool. You may have this test every year starting at age 29.  Flexible sigmoidoscopy or colonoscopy. You may have a sigmoidoscopy every 5 years or a colonoscopy every 10 years starting at age 82.  Prostate cancer screening. Recommendations will vary depending on your family history and other risks.  Hepatitis C blood test.  Hepatitis B blood test.  Sexually transmitted disease (STD) testing.  Diabetes screening. This is done by checking your blood sugar (glucose) after you have not eaten for a while (fasting). You may have this done every 1-3 years.  Abdominal aortic aneurysm (AAA) screening. You may need this if you are a current or former smoker.  Osteoporosis. You may be screened starting at age 84 if you are at high risk. Talk with your health care provider about your test results, treatment options, and if necessary, the need for more  tests. Vaccines  Your health care provider may recommend certain vaccines, such as:  Influenza vaccine. This is recommended every year.  Tetanus, diphtheria, and acellular pertussis (Tdap, Td) vaccine. You may need a Td booster  every 10 years.  Zoster vaccine. You may need this after age 2.  Pneumococcal 13-valent conjugate (PCV13) vaccine. One dose is recommended after age 106.  Pneumococcal polysaccharide (PPSV23) vaccine. One dose is recommended after age 33. Talk to your health care provider about which screenings and vaccines you need and how often you need them. This information is not intended to replace advice given to you by your health care provider. Make sure you discuss any questions you have with your health care provider. Document Released: 12/06/2015 Document Revised: 07/29/2016 Document Reviewed: 09/10/2015 Elsevier Interactive Patient Education  2017 [place] Prevention in the Home Falls can cause injuries. They can happen to people of all ages. There are many things you can do to make your home safe and to help prevent falls. What can I do on the outside of my home?  Regularly fix the edges of walkways and driveways and fix any cracks.  Remove anything that might make you trip as you walk through a door, such as a raised step or threshold.  Trim any bushes or trees on the path to your home.  Use bright outdoor lighting.  Clear any walking paths of anything that might make someone trip, such as rocks or tools.  Regularly check to see if handrails are loose or broken. Make sure that both sides of any steps have handrails.  Any raised decks and porches should have guardrails on the edges.  Have any leaves, snow, or ice cleared regularly.  Use sand or salt on walking paths during winter.  Clean up any spills in your garage right away. This includes oil or grease spills. What can I do in the bathroom?  Use night lights.  Install grab bars by the toilet and in the tub and shower. Do not use towel bars as grab bars.  Use non-skid mats or decals in the tub or shower.  If you need to sit down in the shower, use a plastic, non-slip stool.  Keep the floor dry. Clean up any  water that spills on the floor as soon as it happens.  Remove soap buildup in the tub or shower regularly.  Attach bath mats securely with double-sided non-slip rug tape.  Do not have throw rugs and other things on the floor that can make you trip. What can I do in the bedroom?  Use night lights.  Make sure that you have a light by your bed that is easy to reach.  Do not use any sheets or blankets that are too big for your bed. They should not hang down onto the floor.  Have a firm chair that has side arms. You can use this for support while you get dressed.  Do not have throw rugs and other things on the floor that can make you trip. What can I do in the kitchen?  Clean up any spills right away.  Avoid walking on wet floors.  Keep items that you use a lot in easy-to-reach places.  If you need to reach something above you, use a strong step stool that has a grab bar.  Keep electrical cords out of the way.  Do not use floor polish or wax that makes floors slippery. If you must use wax, use non-skid floor  wax.  Do not have throw rugs and other things on the floor that can make you trip. What can I do with my stairs?  Do not leave any items on the stairs.  Make sure that there are handrails on both sides of the stairs and use them. Fix handrails that are broken or loose. Make sure that handrails are as long as the stairways.  Check any carpeting to make sure that it is firmly attached to the stairs. Fix any carpet that is loose or worn.  Avoid having throw rugs at the top or bottom of the stairs. If you do have throw rugs, attach them to the floor with carpet tape.  Make sure that you have a light switch at the top of the stairs and the bottom of the stairs. If you do not have them, ask someone to add them for you. What else can I do to help prevent falls?  Wear shoes that:  Do not have high heels.  Have rubber bottoms.  Are comfortable and fit you well.  Are closed  at the toe. Do not wear sandals.  If you use a stepladder:  Make sure that it is fully opened. Do not climb a closed stepladder.  Make sure that both sides of the stepladder are locked into place.  Ask someone to hold it for you, if possible.  Clearly mark and make sure that you can see:  Any grab bars or handrails.  First and last steps.  Where the edge of each step is.  Use tools that help you move around (mobility aids) if they are needed. These include:  Canes.  Walkers.  Scooters.  Crutches.  Turn on the lights when you go into a dark area. Replace any light bulbs as soon as they burn out.  Set up your furniture so you have a clear path. Avoid moving your furniture around.  If any of your floors are uneven, fix them.  If there are any pets around you, be aware of where they are.  Review your medicines with your doctor. Some medicines can make you feel dizzy. This can increase your chance of falling. Ask your doctor what other things that you can do to help prevent falls. This information is not intended to replace advice given to you by your health care provider. Make sure you discuss any questions you have with your health care provider. Document Released: 09/05/2009 Document Revised: 04/16/2016 Document Reviewed: 12/14/2014 Elsevier Interactive Patient Education  2017 [employer].

## 2021-01-11 ENCOUNTER — Telehealth: Payer: Self-pay | Admitting: Family Medicine

## 2021-01-11 NOTE — Telephone Encounter (Signed)
Please call patient.  See about getting him scheduled for a nonfasting lab visit so we can get his follow-up labs done.  He would need an office visit after that so we can talk about his chronic shortness of breath.  The orders are already in the EMR.  If he is acutely worse in the meantime then he needs to seek evaluation.  Thanks.  Thanks.

## 2021-01-13 NOTE — Telephone Encounter (Signed)
LVMTCB

## 2021-02-07 NOTE — Patient Instructions (Addendum)
November 19, 2020  Dear [name],  It was a pleasure meeting you during our initial appointment on November 19, 2020. Below is a summary of the goals we discussed and components of chronic care management. Please contact me anytime with questions or concerns.    For chronic runny nose and nasal drip - Stop Benadryl. Start over the counter cetirizine 10 mg once daily.  For shortness of breath - Let's review your insurance formulary. We will contact you with alternative inhaler options.  Debbora Dus, PharmD Clinical Pharmacist [facility] [phone]

## 2021-02-12 ENCOUNTER — Telehealth: Payer: Medicare Other

## 2021-03-29 ENCOUNTER — Other Ambulatory Visit: Payer: Self-pay | Admitting: Family Medicine

## 2021-03-31 NOTE — Telephone Encounter (Signed)
Pharmacy requests refill on: Furosemide 40 mg   LAST REFILL: 11/08/2020 LAST OV: 11/08/2020 NEXT OV: Not Scheduled  PHARMACY: [facility]

## 2021-04-14 ENCOUNTER — Encounter: Payer: Self-pay | Admitting: Family Medicine

## 2021-04-15 ENCOUNTER — Telehealth: Payer: Self-pay | Admitting: Family Medicine

## 2021-04-15 MED ORDER — HYDROCOD POLST-CPM POLST ER 10-8 MG/5ML PO SUER: 5.0000 mL | Freq: Two times a day (BID) | ORAL | 0 refills | Status: DC | PRN

## 2021-04-15 NOTE — Telephone Encounter (Signed)
Patient has had a dry cough/itchy throat x 2 weeks. He was wanting to get the tussionex refilled as that is the only thing that really helps his throat. Wants sent to [facility] if approved. Did advised patient he may possibly need to be seen first but will send the message and let him know.

## 2021-04-15 NOTE — Addendum Note (Signed)
Addended by: Tonia Ghent on: 04/15/2021 04:56 PM   Modules accepted: Orders

## 2021-04-15 NOTE — Telephone Encounter (Signed)
Sent. Sedation caution.  If that isn't helping or if worse in the meantime, then please have him seek eval/call triage line at [phone].  Thanks.

## 2021-04-15 NOTE — Telephone Encounter (Signed)
Please check with patient.  See if he is asking about cough medicine/which medicine specifically.  Please see about recent symptoms.  Thanks.

## 2021-04-15 NOTE — Telephone Encounter (Signed)
Wife aware rx was sent and advised if patient gets worse or rx does not help to let us know and/or seek eval.

## 2021-05-16 ENCOUNTER — Other Ambulatory Visit: Payer: Self-pay | Admitting: Family Medicine

## 2021-06-19 ENCOUNTER — Other Ambulatory Visit: Payer: Self-pay | Admitting: Family Medicine

## 2021-06-20 NOTE — Telephone Encounter (Signed)
Spoke with patient and his wife about this request. Mrs.Skillin states this RX was sent in May for the same symptoms and is the only thing that helps his dry/hacking cough. Symptoms improved/resolved originally after last RX but started back about 3 weeks ago. Patient tested negative for Covid at home about 2 weeks ago. Patient has a little sore throat from coughing.  No other symptoms present.   Mrs Depass was advised that message would be sent to Dr Damita Dunnings to review.

## 2021-06-22 NOTE — Telephone Encounter (Signed)
Sent. Thanks.   

## 2021-07-01 ENCOUNTER — Telehealth: Payer: Self-pay

## 2021-07-01 ENCOUNTER — Emergency Department (HOSPITAL_COMMUNITY): Payer: Medicare Other

## 2021-07-01 ENCOUNTER — Inpatient Hospital Stay (HOSPITAL_COMMUNITY)
Admission: EM | Admit: 2021-07-01 | Discharge: 2021-08-12 | DRG: 004 | Disposition: A | Discharge status: Home Health | Payer: Medicare Other | Attending: Internal Medicine | Admitting: Internal Medicine

## 2021-07-01 ENCOUNTER — Encounter (HOSPITAL_COMMUNITY): Payer: Self-pay | Admitting: Emergency Medicine

## 2021-07-01 DIAGNOSIS — G4733 Obstructive sleep apnea (adult) (pediatric): Secondary | ICD-10-CM | POA: Diagnosis present

## 2021-07-01 DIAGNOSIS — Z4659 Encounter for fitting and adjustment of other gastrointestinal appliance and device: Secondary | ICD-10-CM

## 2021-07-01 DIAGNOSIS — R0602 Shortness of breath: Secondary | ICD-10-CM

## 2021-07-01 DIAGNOSIS — J441 Chronic obstructive pulmonary disease with (acute) exacerbation: Secondary | ICD-10-CM | POA: Diagnosis present

## 2021-07-01 DIAGNOSIS — R634 Abnormal weight loss: Secondary | ICD-10-CM | POA: Diagnosis not present

## 2021-07-01 DIAGNOSIS — I272 Pulmonary hypertension, unspecified: Secondary | ICD-10-CM | POA: Diagnosis present

## 2021-07-01 DIAGNOSIS — Z87891 Personal history of nicotine dependence: Secondary | ICD-10-CM

## 2021-07-01 DIAGNOSIS — Z8249 Family history of ischemic heart disease and other diseases of the circulatory system: Secondary | ICD-10-CM

## 2021-07-01 DIAGNOSIS — J9600 Acute respiratory failure, unspecified whether with hypoxia or hypercapnia: Secondary | ICD-10-CM | POA: Diagnosis present

## 2021-07-01 DIAGNOSIS — Z66 Do not resuscitate: Secondary | ICD-10-CM | POA: Diagnosis present

## 2021-07-01 DIAGNOSIS — Z6824 Body mass index (BMI) 24.0-24.9, adult: Secondary | ICD-10-CM | POA: Diagnosis not present

## 2021-07-01 DIAGNOSIS — Z9841 Cataract extraction status, right eye: Secondary | ICD-10-CM

## 2021-07-01 DIAGNOSIS — F419 Anxiety disorder, unspecified: Secondary | ICD-10-CM | POA: Diagnosis present

## 2021-07-01 DIAGNOSIS — J3489 Other specified disorders of nose and nasal sinuses: Secondary | ICD-10-CM | POA: Diagnosis not present

## 2021-07-01 DIAGNOSIS — R1310 Dysphagia, unspecified: Secondary | ICD-10-CM | POA: Diagnosis present

## 2021-07-01 DIAGNOSIS — J9601 Acute respiratory failure with hypoxia: Principal | ICD-10-CM | POA: Diagnosis present

## 2021-07-01 DIAGNOSIS — D3501 Benign neoplasm of right adrenal gland: Secondary | ICD-10-CM | POA: Diagnosis not present

## 2021-07-01 DIAGNOSIS — D649 Anemia, unspecified: Secondary | ICD-10-CM | POA: Diagnosis present

## 2021-07-01 DIAGNOSIS — C33 Malignant neoplasm of trachea: Secondary | ICD-10-CM | POA: Diagnosis not present

## 2021-07-01 DIAGNOSIS — I2699 Other pulmonary embolism without acute cor pulmonale: Secondary | ICD-10-CM | POA: Diagnosis not present

## 2021-07-01 DIAGNOSIS — Z930 Tracheostomy status: Secondary | ICD-10-CM | POA: Diagnosis not present

## 2021-07-01 DIAGNOSIS — Z79899 Other long term (current) drug therapy: Secondary | ICD-10-CM

## 2021-07-01 DIAGNOSIS — I700 Atherosclerosis of aorta: Secondary | ICD-10-CM | POA: Diagnosis present

## 2021-07-01 DIAGNOSIS — I455 Other specified heart block: Secondary | ICD-10-CM | POA: Diagnosis not present

## 2021-07-01 DIAGNOSIS — J387 Other diseases of larynx: Secondary | ICD-10-CM | POA: Diagnosis not present

## 2021-07-01 DIAGNOSIS — E1151 Type 2 diabetes mellitus with diabetic peripheral angiopathy without gangrene: Secondary | ICD-10-CM | POA: Diagnosis present

## 2021-07-01 DIAGNOSIS — J9811 Atelectasis: Secondary | ICD-10-CM | POA: Diagnosis present

## 2021-07-01 DIAGNOSIS — I10 Essential (primary) hypertension: Secondary | ICD-10-CM | POA: Diagnosis present

## 2021-07-01 DIAGNOSIS — Z7982 Long term (current) use of aspirin: Secondary | ICD-10-CM

## 2021-07-01 DIAGNOSIS — Z833 Family history of diabetes mellitus: Secondary | ICD-10-CM

## 2021-07-01 DIAGNOSIS — J40 Bronchitis, not specified as acute or chronic: Secondary | ICD-10-CM | POA: Diagnosis present

## 2021-07-01 DIAGNOSIS — F0390 Unspecified dementia without behavioral disturbance: Secondary | ICD-10-CM | POA: Diagnosis present

## 2021-07-01 DIAGNOSIS — R0600 Dyspnea, unspecified: Secondary | ICD-10-CM | POA: Diagnosis not present

## 2021-07-01 DIAGNOSIS — Z20822 Contact with and (suspected) exposure to covid-19: Secondary | ICD-10-CM | POA: Diagnosis not present

## 2021-07-01 DIAGNOSIS — K760 Fatty (change of) liver, not elsewhere classified: Secondary | ICD-10-CM | POA: Diagnosis present

## 2021-07-01 DIAGNOSIS — Z888 Allergy status to other drugs, medicaments and biological substances status: Secondary | ICD-10-CM

## 2021-07-01 DIAGNOSIS — C321 Malignant neoplasm of supraglottis: Secondary | ICD-10-CM | POA: Diagnosis present

## 2021-07-01 DIAGNOSIS — I313 Pericardial effusion (noninflammatory): Secondary | ICD-10-CM | POA: Diagnosis not present

## 2021-07-01 DIAGNOSIS — G9341 Metabolic encephalopathy: Secondary | ICD-10-CM | POA: Diagnosis present

## 2021-07-01 DIAGNOSIS — N2889 Other specified disorders of kidney and ureter: Secondary | ICD-10-CM | POA: Diagnosis present

## 2021-07-01 DIAGNOSIS — K5730 Diverticulosis of large intestine without perforation or abscess without bleeding: Secondary | ICD-10-CM | POA: Diagnosis not present

## 2021-07-01 DIAGNOSIS — Z96641 Presence of right artificial hip joint: Secondary | ICD-10-CM | POA: Diagnosis present

## 2021-07-01 DIAGNOSIS — Z9842 Cataract extraction status, left eye: Secondary | ICD-10-CM

## 2021-07-01 DIAGNOSIS — I252 Old myocardial infarction: Secondary | ICD-10-CM

## 2021-07-01 DIAGNOSIS — Z7189 Other specified counseling: Secondary | ICD-10-CM | POA: Diagnosis not present

## 2021-07-01 DIAGNOSIS — J90 Pleural effusion, not elsewhere classified: Secondary | ICD-10-CM | POA: Diagnosis not present

## 2021-07-01 DIAGNOSIS — J9621 Acute and chronic respiratory failure with hypoxia: Secondary | ICD-10-CM | POA: Diagnosis present

## 2021-07-01 DIAGNOSIS — I340 Nonrheumatic mitral (valve) insufficiency: Secondary | ICD-10-CM | POA: Diagnosis present

## 2021-07-01 DIAGNOSIS — C320 Malignant neoplasm of glottis: Secondary | ICD-10-CM | POA: Diagnosis present

## 2021-07-01 DIAGNOSIS — I712 Thoracic aortic aneurysm, without rupture: Secondary | ICD-10-CM | POA: Diagnosis not present

## 2021-07-01 DIAGNOSIS — J329 Chronic sinusitis, unspecified: Secondary | ICD-10-CM | POA: Diagnosis not present

## 2021-07-01 DIAGNOSIS — H9190 Unspecified hearing loss, unspecified ear: Secondary | ICD-10-CM | POA: Diagnosis present

## 2021-07-01 DIAGNOSIS — M47812 Spondylosis without myelopathy or radiculopathy, cervical region: Secondary | ICD-10-CM | POA: Diagnosis not present

## 2021-07-01 DIAGNOSIS — E785 Hyperlipidemia, unspecified: Secondary | ICD-10-CM | POA: Diagnosis present

## 2021-07-01 DIAGNOSIS — J0410 Acute tracheitis without obstruction: Secondary | ICD-10-CM | POA: Diagnosis not present

## 2021-07-01 DIAGNOSIS — I2510 Atherosclerotic heart disease of native coronary artery without angina pectoris: Secondary | ICD-10-CM | POA: Diagnosis not present

## 2021-07-01 DIAGNOSIS — Z961 Presence of intraocular lens: Secondary | ICD-10-CM | POA: Diagnosis present

## 2021-07-01 DIAGNOSIS — E43 Unspecified severe protein-calorie malnutrition: Secondary | ICD-10-CM | POA: Diagnosis not present

## 2021-07-01 DIAGNOSIS — I119 Hypertensive heart disease without heart failure: Secondary | ICD-10-CM | POA: Diagnosis not present

## 2021-07-01 DIAGNOSIS — Z515 Encounter for palliative care: Secondary | ICD-10-CM | POA: Diagnosis not present

## 2021-07-01 DIAGNOSIS — R001 Bradycardia, unspecified: Secondary | ICD-10-CM | POA: Diagnosis not present

## 2021-07-01 DIAGNOSIS — J439 Emphysema, unspecified: Secondary | ICD-10-CM | POA: Diagnosis not present

## 2021-07-01 DIAGNOSIS — C329 Malignant neoplasm of larynx, unspecified: Secondary | ICD-10-CM | POA: Diagnosis not present

## 2021-07-01 DIAGNOSIS — Z95828 Presence of other vascular implants and grafts: Secondary | ICD-10-CM

## 2021-07-01 DIAGNOSIS — E291 Testicular hypofunction: Secondary | ICD-10-CM | POA: Diagnosis not present

## 2021-07-01 DIAGNOSIS — R9431 Abnormal electrocardiogram [ECG] [EKG]: Secondary | ICD-10-CM | POA: Diagnosis not present

## 2021-07-01 LAB — COMPREHENSIVE METABOLIC PANEL
ALT: 10 U/L (ref 0–44)
AST: 18 U/L (ref 15–41)
Albumin: 3.2 g/dL — ABNORMAL LOW (ref 3.5–5.0)
Alkaline Phosphatase: 39 U/L (ref 38–126)
Anion gap: 9 (ref 5–15)
BUN: 26 mg/dL — ABNORMAL HIGH (ref 8–23)
CO2: 29 mmol/L (ref 22–32)
Calcium: 9.3 mg/dL (ref 8.9–10.3)
Chloride: 102 mmol/L (ref 98–111)
Creatinine, Ser: 1.19 mg/dL (ref 0.61–1.24)
GFR, Estimated: 59 mL/min — ABNORMAL LOW (ref >60)
Glucose, Bld: 87 mg/dL (ref 70–99)
Potassium: 3.9 mmol/L (ref 3.5–5.1)
Sodium: 140 mmol/L (ref 135–145)
Total Bilirubin: 0.9 mg/dL (ref 0.3–1.2)
Total Protein: 6.6 g/dL (ref 6.5–8.1)

## 2021-07-01 LAB — CBC WITH DIFFERENTIAL/PLATELET
Abs Immature Granulocytes: 0.03 10*3/uL (ref 0.00–0.07)
Basophils Absolute: 0 10*3/uL (ref 0.0–0.1)
Basophils Relative: 0 %
Eosinophils Absolute: 0.1 10*3/uL (ref 0.0–0.5)
Eosinophils Relative: 2 %
HCT: 37.5 % — ABNORMAL LOW (ref 39.0–52.0)
Hemoglobin: 11.4 g/dL — ABNORMAL LOW (ref 13.0–17.0)
Immature Granulocytes: 0 %
Lymphocytes Relative: 18 %
Lymphs Abs: 1.3 10*3/uL (ref 0.7–4.0)
MCH: 30.1 pg (ref 26.0–34.0)
MCHC: 30.4 g/dL (ref 30.0–36.0)
MCV: 98.9 fL (ref 80.0–100.0)
Monocytes Absolute: 0.7 10*3/uL (ref 0.1–1.0)
Monocytes Relative: 10 %
Neutro Abs: 4.9 10*3/uL (ref 1.7–7.7)
Neutrophils Relative %: 70 %
Platelets: 197 10*3/uL (ref 150–400)
RBC: 3.79 MIL/uL — ABNORMAL LOW (ref 4.22–5.81)
RDW: 13.7 % (ref 11.5–15.5)
WBC: 7.1 10*3/uL (ref 4.0–10.5)
nRBC: 0 % (ref 0.0–0.2)

## 2021-07-01 LAB — RESP PANEL BY RT-PCR (FLU A&B, COVID) ARPGX2
Influenza A by PCR: NEGATIVE
Influenza B by PCR: NEGATIVE
SARS Coronavirus 2 by RT PCR: NEGATIVE

## 2021-07-01 LAB — BRAIN NATRIURETIC PEPTIDE: B Natriuretic Peptide: 157.6 pg/mL — ABNORMAL HIGH (ref 0.0–100.0)

## 2021-07-01 LAB — TROPONIN I (HIGH SENSITIVITY)
Troponin I (High Sensitivity): 20 ng/L — ABNORMAL HIGH (ref <18)
Troponin I (High Sensitivity): 21 ng/L — ABNORMAL HIGH (ref <18)

## 2021-07-01 MED ORDER — SODIUM CHLORIDE 0.9 % IV SOLN
500.0000 mg | Freq: Once | INTRAVENOUS | Status: AC
  Administered 2021-07-01: 500 mg via INTRAVENOUS
  Filled 2021-07-01: qty 500

## 2021-07-01 MED ORDER — IPRATROPIUM-ALBUTEROL 0.5-2.5 (3) MG/3ML IN SOLN
3.0000 mL | Freq: Once | RESPIRATORY_TRACT | Status: AC
  Administered 2021-07-01: 3 mL via RESPIRATORY_TRACT
  Filled 2021-07-01: qty 3

## 2021-07-01 MED ORDER — METHYLPREDNISOLONE SODIUM SUCC 125 MG IJ SOLR
125.0000 mg | Freq: Every day | INTRAMUSCULAR | Status: DC
  Administered 2021-07-01: 125 mg via INTRAVENOUS
  Filled 2021-07-01: qty 2

## 2021-07-01 MED ORDER — SODIUM CHLORIDE 0.9 % IV SOLN
1.0000 g | Freq: Once | INTRAVENOUS | Status: AC
  Administered 2021-07-01: 1 g via INTRAVENOUS
  Filled 2021-07-01: qty 10

## 2021-07-01 MED ORDER — ALBUTEROL SULFATE HFA 108 (90 BASE) MCG/ACT IN AERS
2.0000 | INHALATION_SPRAY | Freq: Once | RESPIRATORY_TRACT | Status: AC
  Administered 2021-07-01: 2 via RESPIRATORY_TRACT
  Filled 2021-07-01: qty 6.7

## 2021-07-01 MED ORDER — IOHEXOL 350 MG/ML SOLN
100.0000 mL | Freq: Once | INTRAVENOUS | Status: AC | PRN
  Administered 2021-07-01: 100 mL via INTRAVENOUS

## 2021-07-01 NOTE — ED Notes (Signed)
This tech ambulated the pt with a walker in his room. As he stood up and ambulated, his room air oxygen saturation declined to 73%. Pt stated that when ambulating, he felt worse and more lightheaded/SOB. Pt was put back in the bed and given 2L [place]. O2 sats are now at 93%. Dr.Long and Annamary Carolin, RN aware of this event.

## 2021-07-01 NOTE — ED Notes (Signed)
Call daughter, [name], for updates: [phone].

## 2021-07-01 NOTE — Telephone Encounter (Signed)
[facility] - Client TELEPHONE ADVICE RECORD [facility] Patient Name: Christopher Santiago Gender: Male DOB: 12-Nov-1931 Age: 87 Y 64 M 22 D Return Phone Number: [phone] (Primary) Address: City/ State/ Zip: [place] [address] Client [facility] - Client Client Site [facility] Physician Renford Dills - MD Contact Type Call Who Is Calling Patient / Member / Family / Caregiver Call Type Triage / Clinical Caller Name Shafiq Larch Relationship To Patient Spouse Return Phone Number [phone] (Primary) Chief Complaint Sore Throat Reason for Call Symptomatic / Request for [place] states her husband he has cough, sore throat, and congestion. Translation No Nurse Assessment Nurse: Sumner Boast, RN, En[place] Derry Date/Time Eilene Ghazi Time): 07/01/2021 12:33:46 PM Confirm and document reason for call. If symptomatic, describe symptoms. ---Caller states her husband he has cough, sore throat, and chest congestion. He has not had a COVID test. No fever. Symptoms started 2 weeks ago. He has taken Mucinex and a Rx cough syrup. He is coughing up yellow thick mucous. Does the patient have any new or worsening symptoms? ---Yes Will a triage be completed? ---Yes Related visit to physician within the last 2 weeks? ---No Does the PT have any chronic conditions? (i.e. diabetes, asthma, this includes High risk factors for pregnancy, etc.) ---Yes List chronic conditions. ---COPD Is this a behavioral health or substance abuse call? ---No Guidelines Guideline Title Affirmed Question Affirmed Notes Nurse Date/Time (Eastern Time) Sore Throat [1] Sore throat with cough/cold symptoms AND [2] present > 5 days Janace Litten 07/01/2021 12:37:51 PM Disp. Time Eilene Ghazi Time) Disposition Final User PLEASE NOTE: All timestamps contained within this report are represented as [place]  Standard Time. CONFIDENTIALTY NOTICE: This fax transmission is intended only for the addressee. It contains information that is legally privileged, confidential or otherwise protected from use or disclosure. If you are not the intended recipient, you are strictly prohibited from reviewing, disclosing, copying using or disseminating any of this information or taking any action in reliance on or regarding this information. If you have received this fax in error, please notify us immediately by telephone so that we can arrange for its return to [place]. Phone: [phone], Toll-Free: [phone], Fax: [phone] Page: 2 of 2 Call Id: [id number] 07/01/2021 12:41:03 PM SEE PCP WITHIN 3 DAYS Yes Sumner Boast, RN, Teola Bradley Disagree/Comply Comply Caller Understands Yes PreDisposition Call Doctor Care Advice Given Per Guideline CALL BACK IF: * You become worse CARE ADVICE given per Sore Throat (Adult) guideline. Referrals REFERRED TO PCP OFFICE
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
## 2021-07-01 NOTE — ED Triage Notes (Signed)
Per [facility] pt coming from home c/o cough and shortness of breath with exertion x 24 hours. Also having sore throat and chills. Patient alert and orientated x 4. Hx of COPD.

## 2021-07-01 NOTE — Telephone Encounter (Signed)
I spoke with pts wife; starting 2 - 2 1/2 wks ago pt has had bad chest congestion with some wheezing and SOB slightly worse than usual SOB with COPD. Pt has prod cough with yellow phlegm, S/T that hurts to swallow and pt having problem swallowing food due to S/T; pt is doing well with drinking. Pt does not have fever, no vomiting or diarrhea, and no dry mouth, abd pain or dizziness. Pt has had H/A on and off. Pt has not been covid tested. Pt has had 2 covid vaccines and one booster. DR Damita Dunnings does not have problem seeing pt in office but if pt condition changes, worsens or deteriorates pt is to go to ED. ED precautions given and Pts wife voiced understanding and will call [phone] if needed prior to appt with Dr Damita Dunnings on 07/04/21. Sending note to Dr Damita Dunnings and Janett Billow  CMA.

## 2021-07-02 ENCOUNTER — Encounter (HOSPITAL_COMMUNITY)
Admission: EM | Disposition: A | Discharge status: Home Health | Payer: Self-pay | Source: Home / Self Care | Attending: Internal Medicine

## 2021-07-02 ENCOUNTER — Inpatient Hospital Stay (HOSPITAL_COMMUNITY): Payer: Medicare Other | Admitting: Anesthesiology

## 2021-07-02 ENCOUNTER — Other Ambulatory Visit: Payer: Self-pay

## 2021-07-02 ENCOUNTER — Encounter (HOSPITAL_COMMUNITY): Payer: Self-pay | Admitting: Internal Medicine

## 2021-07-02 ENCOUNTER — Inpatient Hospital Stay (HOSPITAL_COMMUNITY): Payer: Medicare Other

## 2021-07-02 DIAGNOSIS — J441 Chronic obstructive pulmonary disease with (acute) exacerbation: Secondary | ICD-10-CM | POA: Diagnosis not present

## 2021-07-02 DIAGNOSIS — J9600 Acute respiratory failure, unspecified whether with hypoxia or hypercapnia: Secondary | ICD-10-CM | POA: Diagnosis present

## 2021-07-02 DIAGNOSIS — J9621 Acute and chronic respiratory failure with hypoxia: Secondary | ICD-10-CM | POA: Diagnosis present

## 2021-07-02 DIAGNOSIS — J387 Other diseases of larynx: Secondary | ICD-10-CM

## 2021-07-02 DIAGNOSIS — I10 Essential (primary) hypertension: Secondary | ICD-10-CM

## 2021-07-02 DIAGNOSIS — N2889 Other specified disorders of kidney and ureter: Secondary | ICD-10-CM | POA: Diagnosis present

## 2021-07-02 HISTORY — PX: DIRECT LARYNGOSCOPY: SHX5326

## 2021-07-02 HISTORY — PX: TRACHEOSTOMY TUBE PLACEMENT: SHX814

## 2021-07-02 LAB — CBC
HCT: 36.4 % — ABNORMAL LOW (ref 39.0–52.0)
Hemoglobin: 11.4 g/dL — ABNORMAL LOW (ref 13.0–17.0)
MCH: 30.6 pg (ref 26.0–34.0)
MCHC: 31.3 g/dL (ref 30.0–36.0)
MCV: 97.6 fL (ref 80.0–100.0)
Platelets: 178 10*3/uL (ref 150–400)
RBC: 3.73 MIL/uL — ABNORMAL LOW (ref 4.22–5.81)
RDW: 13.6 % (ref 11.5–15.5)
WBC: 6.4 10*3/uL (ref 4.0–10.5)
nRBC: 0 % (ref 0.0–0.2)

## 2021-07-02 LAB — BASIC METABOLIC PANEL
Anion gap: 11 (ref 5–15)
BUN: 25 mg/dL — ABNORMAL HIGH (ref 8–23)
CO2: 29 mmol/L (ref 22–32)
Calcium: 9.3 mg/dL (ref 8.9–10.3)
Chloride: 99 mmol/L (ref 98–111)
Creatinine, Ser: 1.21 mg/dL (ref 0.61–1.24)
GFR, Estimated: 58 mL/min — ABNORMAL LOW (ref >60)
Glucose, Bld: 150 mg/dL — ABNORMAL HIGH (ref 70–99)
Potassium: 3.8 mmol/L (ref 3.5–5.1)
Sodium: 139 mmol/L (ref 135–145)

## 2021-07-02 LAB — GLUCOSE, CAPILLARY
Glucose-Capillary: 180 mg/dL — ABNORMAL HIGH (ref 70–99)
Glucose-Capillary: 167 mg/dL — ABNORMAL HIGH (ref 70–99)

## 2021-07-02 LAB — MRSA NEXT GEN BY PCR, NASAL: MRSA by PCR Next Gen: NOT DETECTED

## 2021-07-02 LAB — HIV ANTIBODY (ROUTINE TESTING W REFLEX): HIV Screen 4th Generation wRfx: NONREACTIVE

## 2021-07-02 SURGERY — LARYNGOSCOPY, DIRECT
Anesthesia: General | Site: Throat

## 2021-07-02 MED ORDER — CHLORHEXIDINE GLUCONATE 0.12 % MT SOLN: 15.0000 mL | Freq: Once | OROMUCOSAL | Status: DC

## 2021-07-02 MED ORDER — LIDOCAINE-EPINEPHRINE 1 %-1:100000 IJ SOLN
INTRAMUSCULAR | Status: DC | PRN
  Administered 2021-07-02: 8 mL

## 2021-07-02 MED ORDER — EPINEPHRINE HCL (NASAL) 0.1 % NA SOLN
NASAL | Status: AC
  Filled 2021-07-02: qty 30

## 2021-07-02 MED ORDER — ALBUTEROL SULFATE (2.5 MG/3ML) 0.083% IN NEBU: 2.5000 mg | INHALATION_SOLUTION | RESPIRATORY_TRACT | Status: DC | PRN

## 2021-07-02 MED ORDER — METOPROLOL TARTRATE 5 MG/5ML IV SOLN
INTRAVENOUS | Status: DC | PRN
  Administered 2021-07-02: 2.5 mg via INTRAVENOUS

## 2021-07-02 MED ORDER — FENTANYL CITRATE (PF) 100 MCG/2ML IJ SOLN: 25.0000 ug | INTRAMUSCULAR | Status: DC | PRN

## 2021-07-02 MED ORDER — EPINEPHRINE HCL (NASAL) 0.1 % NA SOLN
NASAL | Status: DC | PRN
  Administered 2021-07-02: 30 mL via TOPICAL

## 2021-07-02 MED ORDER — LIDOCAINE 2% (20 MG/ML) 5 ML SYRINGE
INTRAMUSCULAR | Status: DC | PRN
  Administered 2021-07-02: 80 mg via INTRAVENOUS

## 2021-07-02 MED ORDER — ENOXAPARIN SODIUM 40 MG/0.4ML IJ SOSY: 40.0000 mg | PREFILLED_SYRINGE | INTRAMUSCULAR | Status: DC

## 2021-07-02 MED ORDER — 0.9 % SODIUM CHLORIDE (POUR BTL) OPTIME
TOPICAL | Status: DC | PRN
  Administered 2021-07-02: 1000 mL

## 2021-07-02 MED ORDER — CEFTRIAXONE SODIUM 1 G IJ SOLR
1.0000 g | INTRAMUSCULAR | Status: AC
  Administered 2021-07-02 – 2021-07-06 (×5): 1 g via INTRAVENOUS
  Filled 2021-07-02 (×5): qty 10

## 2021-07-02 MED ORDER — OXYMETAZOLINE HCL 0.05 % NA SOLN
NASAL | Status: AC
  Filled 2021-07-02: qty 30

## 2021-07-02 MED ORDER — DEXAMETHASONE SODIUM PHOSPHATE 10 MG/ML IJ SOLN
INTRAMUSCULAR | Status: DC | PRN
  Administered 2021-07-02: 10 mg via INTRAVENOUS

## 2021-07-02 MED ORDER — ORAL CARE MOUTH RINSE: 15.0000 mL | Freq: Once | OROMUCOSAL | Status: DC

## 2021-07-02 MED ORDER — IPRATROPIUM-ALBUTEROL 0.5-2.5 (3) MG/3ML IN SOLN
3.0000 mL | Freq: Four times a day (QID) | RESPIRATORY_TRACT | Status: DC
  Administered 2021-07-02 – 2021-07-05 (×14): 3 mL via RESPIRATORY_TRACT
  Filled 2021-07-02 (×14): qty 3

## 2021-07-02 MED ORDER — CHLORHEXIDINE GLUCONATE 0.12% ORAL RINSE (MEDLINE KIT)
15.0000 mL | Freq: Two times a day (BID) | OROMUCOSAL | Status: DC
  Administered 2021-07-02 – 2021-08-12 (×81): 15 mL via OROMUCOSAL

## 2021-07-02 MED ORDER — ROCURONIUM BROMIDE 10 MG/ML (PF) SYRINGE
PREFILLED_SYRINGE | INTRAVENOUS | Status: DC | PRN
  Administered 2021-07-02: 30 mg via INTRAVENOUS
  Administered 2021-07-02: 20 mg via INTRAVENOUS

## 2021-07-02 MED ORDER — METHYLPREDNISOLONE SODIUM SUCC 40 MG IJ SOLR
40.0000 mg | Freq: Two times a day (BID) | INTRAMUSCULAR | Status: AC
  Administered 2021-07-02 – 2021-07-05 (×8): 40 mg via INTRAVENOUS
  Filled 2021-07-02 (×8): qty 1

## 2021-07-02 MED ORDER — LIDOCAINE-EPINEPHRINE 1 %-1:100000 IJ SOLN
INTRAMUSCULAR | Status: AC
  Filled 2021-07-02: qty 1

## 2021-07-02 MED ORDER — HEMOSTATIC AGENTS (NO CHARGE) OPTIME
TOPICAL | Status: DC | PRN
  Administered 2021-07-02: 1

## 2021-07-02 MED ORDER — EPHEDRINE SULFATE 50 MG/ML IJ SOLN
INTRAMUSCULAR | Status: DC | PRN
  Administered 2021-07-02 (×2): 5 mg via INTRAVENOUS

## 2021-07-02 MED ORDER — ONDANSETRON HCL 4 MG/2ML IJ SOLN
INTRAMUSCULAR | Status: DC | PRN
Start: 2021-07-02 — End: 2021-07-02
  Administered 2021-07-02: 4 mg via INTRAVENOUS

## 2021-07-02 MED ORDER — PROPOFOL 10 MG/ML IV BOLUS
INTRAVENOUS | Status: DC | PRN
  Administered 2021-07-02: 100 mg via INTRAVENOUS

## 2021-07-02 MED ORDER — LACTATED RINGERS IV SOLN: INTRAVENOUS | Status: DC

## 2021-07-02 MED ORDER — SUCCINYLCHOLINE CHLORIDE 200 MG/10ML IV SOSY
PREFILLED_SYRINGE | INTRAVENOUS | Status: DC | PRN
  Administered 2021-07-02: 100 mg via INTRAVENOUS

## 2021-07-02 MED ORDER — NYSTATIN 100000 UNIT/ML MT SUSP
5.0000 mL | Freq: Four times a day (QID) | OROMUCOSAL | Status: DC
  Administered 2021-07-04 – 2021-07-06 (×8): 500000 [IU] via ORAL
  Filled 2021-07-02 (×13): qty 5

## 2021-07-02 MED ORDER — ALBUMIN HUMAN 5 % IV SOLN: INTRAVENOUS | Status: DC | PRN

## 2021-07-02 MED ORDER — PREDNISONE 20 MG PO TABS
40.0000 mg | ORAL_TABLET | Freq: Every day | ORAL | Status: DC
  Filled 2021-07-02: qty 2

## 2021-07-02 MED ORDER — METOPROLOL TARTRATE 5 MG/5ML IV SOLN
INTRAVENOUS | Status: AC
  Filled 2021-07-02: qty 5

## 2021-07-02 MED ORDER — UMECLIDINIUM BROMIDE 62.5 MCG/INH IN AEPB
1.0000 | INHALATION_SPRAY | Freq: Every day | RESPIRATORY_TRACT | Status: DC
  Administered 2021-07-02: 1 via RESPIRATORY_TRACT
  Filled 2021-07-02: qty 7

## 2021-07-02 MED ORDER — FENTANYL CITRATE (PF) 250 MCG/5ML IJ SOLN
INTRAMUSCULAR | Status: AC
  Filled 2021-07-02: qty 5

## 2021-07-02 MED ORDER — ORAL CARE MOUTH RINSE
15.0000 mL | OROMUCOSAL | Status: DC
  Administered 2021-07-02 – 2021-08-10 (×343): 15 mL via OROMUCOSAL

## 2021-07-02 MED ORDER — MOMETASONE FURO-FORMOTEROL FUM 200-5 MCG/ACT IN AERO
2.0000 | INHALATION_SPRAY | Freq: Two times a day (BID) | RESPIRATORY_TRACT | Status: DC
  Administered 2021-07-02: 2 via RESPIRATORY_TRACT
  Filled 2021-07-02: qty 8.8

## 2021-07-02 MED ORDER — CHLORHEXIDINE GLUCONATE CLOTH 2 % EX PADS
6.0000 | MEDICATED_PAD | Freq: Every day | CUTANEOUS | Status: DC
  Administered 2021-07-02: 6 via TOPICAL

## 2021-07-02 MED ORDER — GUAIFENESIN 100 MG/5ML PO SOLN
10.0000 mL | Freq: Three times a day (TID) | ORAL | Status: DC
  Administered 2021-07-02 – 2021-08-12 (×107): 200 mg via ORAL
  Filled 2021-07-02 (×9): qty 10
  Filled 2021-07-02 (×2): qty 5
  Filled 2021-07-02: qty 10
  Filled 2021-07-02: qty 5
  Filled 2021-07-02 (×5): qty 10
  Filled 2021-07-02: qty 5
  Filled 2021-07-02 (×2): qty 10
  Filled 2021-07-02: qty 5
  Filled 2021-07-02 (×3): qty 10
  Filled 2021-07-02: qty 5
  Filled 2021-07-02 (×4): qty 10
  Filled 2021-07-02: qty 5
  Filled 2021-07-02 (×5): qty 10
  Filled 2021-07-02: qty 5
  Filled 2021-07-02 (×8): qty 10
  Filled 2021-07-02: qty 5
  Filled 2021-07-02 (×5): qty 10
  Filled 2021-07-02 (×2): qty 5
  Filled 2021-07-02 (×6): qty 10
  Filled 2021-07-02: qty 5
  Filled 2021-07-02 (×3): qty 10
  Filled 2021-07-02: qty 5
  Filled 2021-07-02: qty 10
  Filled 2021-07-02: qty 5
  Filled 2021-07-02 (×6): qty 10
  Filled 2021-07-02 (×2): qty 5
  Filled 2021-07-02 (×2): qty 10
  Filled 2021-07-02: qty 5
  Filled 2021-07-02: qty 10
  Filled 2021-07-02: qty 5
  Filled 2021-07-02 (×3): qty 10
  Filled 2021-07-02: qty 5
  Filled 2021-07-02 (×7): qty 10
  Filled 2021-07-02 (×2): qty 5
  Filled 2021-07-02: qty 10
  Filled 2021-07-02: qty 5
  Filled 2021-07-02 (×11): qty 10
  Filled 2021-07-02 (×2): qty 5
  Filled 2021-07-02 (×5): qty 10
  Filled 2021-07-02 (×2): qty 5

## 2021-07-02 MED ORDER — SODIUM CHLORIDE 0.9 % IV SOLN: INTRAVENOUS | Status: DC

## 2021-07-02 MED ORDER — PROMETHAZINE HCL 25 MG/ML IJ SOLN: 6.2500 mg | INTRAMUSCULAR | Status: DC | PRN

## 2021-07-02 MED ORDER — SUGAMMADEX SODIUM 200 MG/2ML IV SOLN
INTRAVENOUS | Status: DC | PRN
  Administered 2021-07-02: 200 mg via INTRAVENOUS

## 2021-07-02 MED ORDER — PHENYLEPHRINE HCL-NACL 20-0.9 MG/250ML-% IV SOLN
INTRAVENOUS | Status: DC | PRN
  Administered 2021-07-02: 25 ug/min via INTRAVENOUS

## 2021-07-02 MED ORDER — FENTANYL CITRATE (PF) 250 MCG/5ML IJ SOLN
INTRAMUSCULAR | Status: DC | PRN
  Administered 2021-07-02: 50 ug via INTRAVENOUS
  Administered 2021-07-02: 25 ug via INTRAVENOUS
  Administered 2021-07-02 (×2): 50 ug via INTRAVENOUS
  Administered 2021-07-02: 25 ug via INTRAVENOUS

## 2021-07-02 SURGICAL SUPPLY — 66 items
BAG COUNTER SPONGE SURGICOUNT (BAG) ×3 IMPLANT
BAG SPNG CNTER NS LX DISP (BAG) ×2
BALLN PULM 15 16.5 18X75 (BALLOONS)
BALLOON PULM 15 16.5 18X75 (BALLOONS) IMPLANT
BLADE CLIPPER SURG (BLADE) ×1 IMPLANT
BLADE SURG 15 STRL LF DISP TIS (BLADE) IMPLANT
BLADE SURG 15 STRL SS (BLADE) ×3
CANISTER SUCT 3000ML PPV (MISCELLANEOUS) ×3 IMPLANT
CLEANER TIP ELECTROSURG 2X2 (MISCELLANEOUS) ×3 IMPLANT
CNTNR URN SCR LID CUP LEK RST (MISCELLANEOUS) IMPLANT
CONT SPEC 4OZ STRL OR WHT (MISCELLANEOUS) ×3
CORD BIPOLAR FORCEPS 12FT (ELECTRODE) ×1 IMPLANT
COVER MAYO STAND STRL (DRAPES) ×3 IMPLANT
COVER SURGICAL LIGHT HANDLE (MISCELLANEOUS) ×3 IMPLANT
DRAPE HALF SHEET 40X57 (DRAPES) ×2 IMPLANT
DRSG TELFA 3X8 NADH (GAUZE/BANDAGES/DRESSINGS) ×3 IMPLANT
ELECT COATED BLADE 2.86 ST (ELECTRODE) ×3 IMPLANT
ELECT REM PT RETURN 9FT ADLT (ELECTROSURGICAL) ×3
ELECTRODE REM PT RTRN 9FT ADLT (ELECTROSURGICAL) ×2 IMPLANT
FORCEPS BIPOLAR SPETZLER 8 1.0 (NEUROSURGERY SUPPLIES) ×1 IMPLANT
GAUZE 4X4 16PLY ~~LOC~~+RFID DBL (SPONGE) ×3 IMPLANT
GAUZE XEROFORM 5X9 LF (GAUZE/BANDAGES/DRESSINGS) IMPLANT
GLOVE SURG ENC MOIS LTX SZ6.5 (GLOVE) ×3 IMPLANT
GLOVE SURG ENC TEXT LTX SZ7 (GLOVE) ×6 IMPLANT
GLOVE SURG UNDER POLY LF SZ7 (GLOVE) ×1 IMPLANT
GOWN STRL REUS W/ TWL LRG LVL3 (GOWN DISPOSABLE) ×2 IMPLANT
GOWN STRL REUS W/TWL LRG LVL3 (GOWN DISPOSABLE) ×6
GUARD TEETH (MISCELLANEOUS) ×3 IMPLANT
HEMOSTAT SNOW SURGICEL 2X4 (HEMOSTASIS) ×1 IMPLANT
HOLDER TRACH TUBE VELCRO 19.5 (MISCELLANEOUS) IMPLANT
KIT BASIN OR (CUSTOM PROCEDURE TRAY) ×3 IMPLANT
KIT SUCTION CATH 14FR (SUCTIONS) IMPLANT
KIT TURNOVER KIT B (KITS) ×3 IMPLANT
NDL 18GX1X1/2 (RX/OR ONLY) (NEEDLE) IMPLANT
NDL HYPO 25GX1X1/2 BEV (NEEDLE) IMPLANT
NDL TRANS ORAL INJECTION (NEEDLE) IMPLANT
NEEDLE 18GX1X1/2 (RX/OR ONLY) (NEEDLE) ×3 IMPLANT
NEEDLE HYPO 25GX1X1/2 BEV (NEEDLE) ×3 IMPLANT
NEEDLE TRANS ORAL INJECTION (NEEDLE) IMPLANT
NS IRRIG 1000ML POUR BTL (IV SOLUTION) ×3 IMPLANT
PACK EENT II TURBAN DRAPE (CUSTOM PROCEDURE TRAY) ×3 IMPLANT
PAD ARMBOARD 7.5X6 YLW CONV (MISCELLANEOUS) ×5 IMPLANT
PAD DRESSING TELFA 3X8 NADH (GAUZE/BANDAGES/DRESSINGS) IMPLANT
PATTIES SURGICAL .5 X3 (DISPOSABLE) ×1 IMPLANT
PENCIL SMOKE EVACUATOR (MISCELLANEOUS) ×3 IMPLANT
POSITIONER HEAD DONUT 9IN (MISCELLANEOUS) ×1 IMPLANT
SOL ANTI FOG 6CC (MISCELLANEOUS) IMPLANT
SOLUTION ANTI FOG 6CC (MISCELLANEOUS) ×1
SPONGE DRAIN TRACH 4X4 STRL 2S (GAUZE/BANDAGES/DRESSINGS) ×2 IMPLANT
SPONGE INTESTINAL PEANUT (DISPOSABLE) ×3 IMPLANT
SURGILUBE 2OZ TUBE FLIPTOP (MISCELLANEOUS) ×1 IMPLANT
SUT CHROMIC 2 0 SH (SUTURE) ×3 IMPLANT
SUT ETHILON 2 0 FS 18 (SUTURE) ×6 IMPLANT
SUT SILK 2 0 PERMA HAND 18 BK (SUTURE) ×3 IMPLANT
SUT SILK 3 0 REEL (SUTURE) ×3 IMPLANT
SUT VIC AB 3-0 SH 27 (SUTURE) ×3
SUT VIC AB 3-0 SH 27X BRD (SUTURE) IMPLANT
SYR 10ML LL (SYRINGE) ×1 IMPLANT
SYR BULB IRRIG 60ML STRL (SYRINGE) ×1 IMPLANT
SYR CONTROL 10ML LL (SYRINGE) ×4 IMPLANT
TOWEL GREEN STERILE FF (TOWEL DISPOSABLE) ×6 IMPLANT
TUBE CONNECTING 12X1/4 (SUCTIONS) ×3 IMPLANT
TUBE TRACH  6.0 CUFF FLEX (MISCELLANEOUS)
TUBE TRACH 6.0 CUFF FLEX (MISCELLANEOUS) IMPLANT
TUBE TRACH 6.0 EXL PROX CUF (TUBING) ×1 IMPLANT
WATER STERILE IRR 1000ML POUR (IV SOLUTION) ×3 IMPLANT

## 2021-07-02 NOTE — Consult Note (Signed)
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 right upper lung nodule. Linear scarring or atelectasis left base. No consolidation or effusion. Normal cardiomediastinal silhouette with aortic atherosclerosis. IMPRESSION: No active disease. Streaky scarring or atelectasis at the left base. Electronically Signed   By: Donavan Foil M.D.   On: 07/01/2021 18:15   CT MAXILLOFACIAL WO CONTRAST  Result Date: 07/02/2021 CLINICAL  DATA:  Initial evaluation for chronic sinusitis, history of vocal cord cancer. EXAM: CT MAXILLOFACIAL WITHOUT CONTRAST CT NECK WITHOUT CONTRAST TECHNIQUE: Multidetector CT imaging of the neck was performed following the standard protocol without intravenous contrast. COMPARISON:  None available. FINDINGS: Pharynx and larynx: Visualized oral cavity within normal limits. No visible acute abnormality about the dentition. Oropharynx and nasopharynx within normal limits. No retropharyngeal collection or swelling.  Epiglottis normal. Vallecula clear. There is asymmetric soft tissue density/fullness at the base of the right aryepiglottic fold extending into the right piriformis sinus (series 3, image 61). Difficult to measure or define a discrete mass within this region given lack of IV contrast. Possible involvement of the right false cord and laryngeal ventricle. Asymmetric sclerosis with medialization of the right arytenoid cartilage and right true cord, which also may be involved (series 3, image 66). Asymmetric infiltration within the right paraglottic fat (series 3, image 62) no definite erosive changes of the adjacent thyroid cartilage. Contralateral left aryepiglottic fold within normal limits. Left piriform sinus clear. Subglottic airway patent clear. Salivary glands: Apparent 1.1 cm soft tissue density lesion at the superior aspect of the right parotid gland noted (series 3, image 13). Finding is closely approximated to adjacent vascular structures, and could reflect a portion of a non-opacified vein, not well assessed on this noncontrast examination. There is an additional 1.2 cm lesion with central calcification along the posterior margin of the right parotid gland inferiorly (series 3, image 30), indeterminate. Left parotid gland unremarkable. Submandibular glands within normal limits. Thyroid: Normal. Lymph nodes: No enlarged or pathologic adenopathy seen on this noncontrast examination. Vascular: Moderate atheromatous change about the visualized aortic arch and proximal great vessels, carotid bifurcations, and skull base. Limited intracranial: Age-related cerebral atrophy. Otherwise unremarkable. Visualized orbits: Prior bilateral ocular lens replacement. Globes and orbital soft tissues demonstrate no acute finding. Mastoids and visualized paranasal sinuses: Chronic mucosal thickening noted about the left frontal sinus, with scattered mucoperiosteal thickening about the ethmoidal air cells bilaterally. Paranasal  sinuses are otherwise clear. No air-fluid levels to suggest acute sinusitis. Mastoid air cells and middle ear cavities are well pneumatized and free of fluid. Skeleton: No visible acute osseous finding. No discrete or worrisome osseous lesions. Mild chronic compression deformity noted involving the T4 vertebral body. Moderate spondylosis noted at C6-7. Upper chest: Emphysematous changes noted within the visualized lungs. 1.2 cm spiculated nodular density with associated architectural distortion present at the right upper lobe, indeterminate. Few additional pleural base nodular densities measuring 7 mm (series 4, image 39) and 9 mm (series 4, image 26) seen at the posterior 0 medial right upper lobe. Visualized upper chest demonstrates no other acute finding. Other: None. IMPRESSION: 1. Asymmetric soft tissue density/fullness at the base of the right aryepiglottic fold extending into the right piriformis sinus, with possible involvement of the right glottis inferiorly as above. Difficult to measure or define a discrete mass within this region given lack of IV contrast. While this finding could reflect sequelae of prior treatment, possible locally recurrent tumor could also have this appearance. Correlation with direct visualization recommended. No adenopathy. 2. 1.2 cm spiculated right upper lobe nodule, with a few additional pleural based nodules at the posterior right upper lobe as above. Findings are indeterminate. Consider one of the following in 3 months for both low-risk and high-risk individuals: (a) repeat chest CT, (b) follow-up PET-CT, or (c) tissue sampling. This recommendation follows the consensus statement: Guidelines for Management of Incidental Pulmonary Nodules Detected on CT Images: From the  Fleischner Society 2017; Radiology 2017; 284:228-243. 3. Mild chronic frontoethmoidal sinusitis. No CT evidence for active disease at this time. 4. Two small approximate 1 cm soft tissue density lesions  involving the right parotid gland as above, indeterminate. Nonemergent outpatient ENT referral for further workup and evaluation suggested. 5. Aortic Atherosclerosis (ICD10-I70.0) and Emphysema (ICD10-J43.9). Electronically Signed   By: Jeannine Boga M.D.   On: 07/02/2021 03:11    ROS:ROS  Blood pressure 116/65, pulse 73, temperature (!) 97.4 F (36.3 C), temperature source Oral, resp. rate 16, height 5\' 6"  (1.676 m), weight 77.1 kg, SpO2 98 %.  PHYSICAL EXAM: CONSTITUTIONAL: Thin, chronically ill appearing male, no distress and alert and oriented x 3 PULMONARY/CHEST WALL: increased work of breathing, on nasal cannula, noisy inspiration,  dysphonia HENT: Head : normocephalic and atraumatic Ears: Right ear:   canal normal, external ear normal and hearing normal Left ear:   canal normal, external ear normal and hearing normal Nose: nose normal and no purulence Mouth/Throat:  Mouth: uvula midline and no oral lesions Throat: oropharynx clear and moist Mucous membranes: normal EYES: conjunctiva normal, EOM normal and PERRL NECK: supple, trachea normal and no thyromegaly or cervical LAD  Procedure: Transnasal fiberoptic laryngoscopy Anesth: Topical with 4% lidocaine Compl: None Findings: Bilateral true vocal folds in paramedian position with limited excursion, near-complete closure with phonation. Mucosal irregularity of the margin of the right true cord noted. Right arytenoid and aryepiglottic fold edematous, with no exophytic lesion noted. Unable to visualize pyriform completely.  Description:  After discussing risks, benefits, and alternatives, the patient was placed in a seated position and the right nasal passage was sprayed with topical anesthetic.  The fiberoptic scope was passed through the right nasal passage to view the pharynx and larynx.  Findings are noted above.  The scope was then removed and he was returned to nursing care in stable condition.   Studies Reviewed:CT Neck  without contrast reviewed, maxillofacial CT reviewed. No evidence of chronic or acute sinusitis, asymmetric soft tissue thickening noted of right aryepiglottic fold into right pyriform. Possibly extending to glottis. Study limited by lack of contrast.   Assessment/Plan: Christopher Santiago is a 87 y/o M with PMHx of COPD and cancer of larynx treated ~ 30 years ago. No records available to review stage/treatment.  -Flexible laryngoscopy demonstrates bilateral true vocal folds in paramedian position with normal adduction, restricted abduction causing narrowing of the airway. No evidence of airway obstruction. Mucosal abnormality/ edema of right supraglottis/glottis noted with no exophytic mass lesion.  -Discussed findings with patient and his wife. We discussed that biopsy would be required to rule out recurrent malignancy. I expressed concern about the narrowing of his airway, and difficulty with extubation following procedure, or the potential for acute airway compromise if any edema should occur. I recommended direct laryngoscopy with biopsy and tracheostomy. Risks, benefits, alternatives and expected postoperative course and recovery were discussed with patient and his wife, who expressed understanding and agreement. Plan discussed with primary.   Thank you for allowing me to participate in the care of this patient. Please do not hesitate to contact me with any questions or concerns.   Jason Coop, [facility] Cell: [phone]   07/02/2021, 12:21 PM

## 2021-07-02 NOTE — Telephone Encounter (Signed)
Currently inpatient, will await inpatient notes.  Thanks.

## 2021-07-02 NOTE — ED Notes (Signed)
Pt NAD in bed, a/ox4. On 2LPM O2. Pt speaking 4-5 word sentences, states increasing SOB and sinus drip x 3-4 months. Pt states exertional dyspnea that has been making him weak. Denies orthopnea or edema. LS dim throughout.

## 2021-07-02 NOTE — ED Notes (Signed)
Patient transported to CT 

## 2021-07-02 NOTE — Anesthesia Procedure Notes (Signed)
Procedure Name: Intubation Date/Time: 07/02/2021 2:55 PM Performed by: Clearnce Sorrel, CRNA Pre-anesthesia Checklist: Patient identified, Emergency Drugs available, Suction available and Patient being monitored Patient Re-evaluated:Patient Re-evaluated prior to induction Oxygen Delivery Method: Circle System Utilized Preoxygenation: Pre-oxygenation with 100% oxygen Induction Type: IV induction Ventilation: Mask ventilation without difficulty Laryngoscope Size: Glidescope and 3 Grade View: Grade I Tube type: Oral Tube size: 6.5 mm Number of attempts: 1 Airway Equipment and Method: Stylet and Oral airway Placement Confirmation: ETT inserted through vocal cords under direct vision, positive ETCO2 and breath sounds checked- equal and bilateral Secured at: 22 cm Tube secured with: Tape Dental Injury: Teeth and Oropharynx as per pre-operative assessment

## 2021-07-02 NOTE — Progress Notes (Signed)
PROGRESS NOTE    Christopher Santiago  ACZ:[id number] DOB: [birth year] DOA: 07/01/2021 PCP: Tonia Ghent, MD   Brief Narrative: 87 year old with past medical history significant for COPD, quit smoking 30 years ago, vocal cord cancer, diabetes who presents complaining of exertional dyspnea, reports sinusitis, postnasal drip, hoarseness for the past 3 to 4 months.  She is also complaining of difficulty swallowing, food gets stuck in the throat, he also report phlegm, in his throat.   CT maxillofacial, soft tissue neck: Asymmetric soft tissue density/fullness at the base of the right aryepiglottic fold, extending into the right piriform sinus with possible involvement of the right glottis inferiorly.  If echo to measure or define a discrete mass within this region given lack of IV contrast.  ENT consulted for direct visualization.  ENT is planning direct laryngoscopy with biopsy and tracheostomy.    Assessment & Plan:   Principal Problem:   Acute respiratory failure with hypoxia (HCC) Active Problems:   Essential hypertension   COPD with acute exacerbation (HCC)   Left renal mass  1-Acute hypoxic respiratory failure: Suspect related to component of aspiration due to dysphagia.  COPD exacerbation Continue with ceftriaxone Continue with nebulizer and IVF steroids CT: With asymmetric soft tissue density fullness at the base of the right aryepiglottic fold: ENT consulted.  Plan for direct laryngoscopy with biopsy and tracheostomy   2-1.2 cm spiculated right upper lobe nodule: He will need follow-up for these  3-left renal mass: Concerning for renal carcinoma Will need to follow-up with urology  Diabetes: Not on medication. Check CBG  Estimated body mass index is 27.44 kg/m as calculated from the following:   Height as of this encounter: 5\' 6"  (1.676 m).   Weight as of this encounter: 77.1 kg.   DVT prophylaxis: Hold Lovenox Code Status: Full Code Family Communication: care  discussed with patient.  Disposition Plan:  Status is: Inpatient  Remains inpatient appropriate because:IV treatments appropriate due to intensity of illness or inability to take PO  Dispo: The patient is from: Home              Anticipated d/c is to: to be determine              Patient currently is not medically stable to d/c.   Difficult to place patient No        Consultants:  ENT CCM for post Sx management   Procedures:  Laryngoscopy   Antimicrobials:    Subjective: He is complaining of dysphagia, unable to eat, take his medications for last week. Food gets stock in his throat,. He report Phlegm in his through and get shock.  Report SOB  Objective: Vitals:   07/02/21 0100 07/02/21 0437 07/02/21 0500 07/02/21 0700  BP: (!) 120/52 108/80 134/70 (!) 157/88  Pulse: 68 82 74 75  Resp: 14 15 10 15   Temp:  98 F (36.7 C)    TempSrc:  Oral    SpO2: 97% 97% 99% 98%  Weight:  77.1 kg    Height:  5\' 6"  (1.676 m)     No intake or output data in the 24 hours ending 07/02/21 0858 Filed Weights   07/02/21 0437  Weight: 77.1 kg    Examination:  General exam: Appears calm and comfortable  Respiratory system: No Wheezing, BL air movement.  Cardiovascular system: S1 & S2 heard, RRR. No JVD, murmurs, rubs, gallops or clicks. No pedal edema. Gastrointestinal system: Abdomen is nondistended, soft and nontender. No organomegaly or  masses felt. Normal bowel sounds heard. Central nervous system: Alert and oriented.  Extremities: Symmetric 5 x 5 power. Skin: No rashes, lesions or ulcers   Data Reviewed: I have personally reviewed following labs and imaging studies  CBC: Recent Labs  Lab 07/01/21 1729 07/02/21 0403  WBC 7.1 6.4  NEUTROABS 4.9  --   HGB 11.4* 11.4*  HCT 37.5* 36.4*  MCV 98.9 97.6  PLT 197 102   Basic Metabolic Panel: Recent Labs  Lab 07/01/21 1729 07/02/21 0403  NA 140 139  K 3.9 3.8  CL 102 99  CO2 29 29  GLUCOSE 87 150*  BUN 26* 25*   CREATININE 1.19 1.21  CALCIUM 9.3 9.3   GFR: Estimated Creatinine Clearance: 41.2 mL/min (by C-G formula based on SCr of 1.21 mg/dL). Liver Function Tests: Recent Labs  Lab 07/01/21 1729  AST 18  ALT 10  ALKPHOS 39  BILITOT 0.9  PROT 6.6  ALBUMIN 3.2*   No results for input(s): LIPASE, AMYLASE in the last 168 hours. No results for input(s): AMMONIA in the last 168 hours. Coagulation Profile: No results for input(s): INR, PROTIME in the last 168 hours. Cardiac Enzymes: No results for input(s): CKTOTAL, CKMB, CKMBINDEX, TROPONINI in the last 168 hours. BNP (last 3 results) Recent Labs    11/08/20 1106  PROBNP 482.0*   HbA1C: No results for input(s): HGBA1C in the last 72 hours. CBG: No results for input(s): GLUCAP in the last 168 hours. Lipid Profile: No results for input(s): CHOL, HDL, LDLCALC, TRIG, CHOLHDL, LDLDIRECT in the last 72 hours. Thyroid Function Tests: No results for input(s): TSH, T4TOTAL, FREET4, T3FREE, THYROIDAB in the last 72 hours. Anemia Panel: No results for input(s): VITAMINB12, FOLATE, FERRITIN, TIBC, IRON, RETICCTPCT in the last 72 hours. Sepsis Labs: No results for input(s): PROCALCITON, LATICACIDVEN in the last 168 hours.  Recent Results (from the past 240 hour(s))  Resp Panel by RT-PCR (Flu A&B, Covid) Nasopharyngeal Swab     Status: None   Collection Time: 07/01/21  5:39 PM   Specimen: Nasopharyngeal Swab; Nasopharyngeal(NP) swabs in vial transport medium  Result Value Ref Range Status   SARS Coronavirus 2 by RT PCR NEGATIVE NEGATIVE Final    Comment: (NOTE) SARS-CoV-2 target nucleic acids are NOT DETECTED.  The SARS-CoV-2 RNA is generally detectable in upper respiratory specimens during the acute phase of infection. The lowest concentration of SARS-CoV-2 viral copies this assay can detect is 138 copies/mL. A negative result does not preclude SARS-Cov-2 infection and should not be used as the sole basis for treatment or other patient  management decisions. A negative result may occur with  improper specimen collection/handling, submission of specimen other than nasopharyngeal swab, presence of viral mutation(s) within the areas targeted by this assay, and inadequate number of viral copies(<138 copies/mL). A negative result must be combined with clinical observations, patient history, and epidemiological information. The expected result is Negative.  Fact Sheet for Patients:  EntrepreneurPulse.com.au  Fact Sheet for Healthcare Providers:  IncredibleEmployment.be  This test is no t yet approved or cleared by the [place] FDA and  has been authorized for detection and/or diagnosis of SARS-CoV-2 by FDA under an Emergency Use Authorization (EUA). This EUA will remain  in effect (meaning this test can be used) for the duration of the COVID-19 declaration under Section 564(b)(1) of the Act, 21 U.S.C.section 360bbb-3(b)(1), unless the authorization is terminated  or revoked sooner.       Influenza A by PCR NEGATIVE NEGATIVE Final  Influenza B by PCR NEGATIVE NEGATIVE Final    Comment: (NOTE) The Xpert Xpress SARS-CoV-2/FLU/RSV plus assay is intended as an aid in the diagnosis of influenza from Nasopharyngeal swab specimens and should not be used as a sole basis for treatment. Nasal washings and aspirates are unacceptable for Xpert Xpress SARS-CoV-2/FLU/RSV testing.  Fact Sheet for Patients: EntrepreneurPulse.com.au  Fact Sheet for Healthcare Providers: IncredibleEmployment.be  This test is not yet approved or cleared by the [place] FDA and has been authorized for detection and/or diagnosis of SARS-CoV-2 by FDA under an Emergency Use Authorization (EUA). This EUA will remain in effect (meaning this test can be used) for the duration of the COVID-19 declaration under Section 564(b)(1) of the Act, 21 U.S.C. section 360bbb-3(b)(1),  unless the authorization is terminated or revoked.  Performed at [facility], [place] [address] [place], [place] [address]          Radiology Studies: CT SOFT TISSUE NECK WO CONTRAST  Result Date: 07/02/2021 CLINICAL DATA:  Initial evaluation for chronic sinusitis, history of vocal cord cancer. EXAM: CT MAXILLOFACIAL WITHOUT CONTRAST CT NECK WITHOUT CONTRAST TECHNIQUE: Multidetector CT imaging of the neck was performed following the standard protocol without intravenous contrast. COMPARISON:  None available. FINDINGS: Pharynx and larynx: Visualized oral cavity within normal limits. No visible acute abnormality about the dentition. Oropharynx and nasopharynx within normal limits. No retropharyngeal collection or swelling. Epiglottis normal. Vallecula clear. There is asymmetric soft tissue density/fullness at the base of the right aryepiglottic fold extending into the right piriformis sinus (series 3, image 61). Difficult to measure or define a discrete mass within this region given lack of IV contrast. Possible involvement of the right false cord and laryngeal ventricle. Asymmetric sclerosis with medialization of the right arytenoid cartilage and right true cord, which also may be involved (series 3, image 66). Asymmetric infiltration within the right paraglottic fat (series 3, image 62) no definite erosive changes of the adjacent thyroid cartilage. Contralateral left aryepiglottic fold within normal limits. Left piriform sinus clear. Subglottic airway patent clear. Salivary glands: Apparent 1.1 cm soft tissue density lesion at the superior aspect of the right parotid gland noted (series 3, image 13). Finding is closely approximated to adjacent vascular structures, and could reflect a portion of a non-opacified vein, not well assessed on this noncontrast examination. There is an additional 1.2 cm lesion with central calcification along the posterior margin of the right parotid gland inferiorly  (series 3, image 30), indeterminate. Left parotid gland unremarkable. Submandibular glands within normal limits. Thyroid: Normal. Lymph nodes: No enlarged or pathologic adenopathy seen on this noncontrast examination. Vascular: Moderate atheromatous change about the visualized aortic arch and proximal great vessels, carotid bifurcations, and skull base. Limited intracranial: Age-related cerebral atrophy. Otherwise unremarkable. Visualized orbits: Prior bilateral ocular lens replacement. Globes and orbital soft tissues demonstrate no acute finding. Mastoids and visualized paranasal sinuses: Chronic mucosal thickening noted about the left frontal sinus, with scattered mucoperiosteal thickening about the ethmoidal air cells bilaterally. Paranasal sinuses are otherwise clear. No air-fluid levels to suggest acute sinusitis. Mastoid air cells and middle ear cavities are well pneumatized and free of fluid. Skeleton: No visible acute osseous finding. No discrete or worrisome osseous lesions. Mild chronic compression deformity noted involving the T4 vertebral body. Moderate spondylosis noted at C6-7. Upper chest: Emphysematous changes noted within the visualized lungs. 1.2 cm spiculated nodular density with associated architectural distortion present at the right upper lobe, indeterminate. Few additional pleural base nodular densities measuring 7 mm (  series 4, image 39) and 9 mm (series 4, image 26) seen at the posterior 0 medial right upper lobe. Visualized upper chest demonstrates no other acute finding. Other: None. IMPRESSION: 1. Asymmetric soft tissue density/fullness at the base of the right aryepiglottic fold extending into the right piriformis sinus, with possible involvement of the right glottis inferiorly as above. Difficult to measure or define a discrete mass within this region given lack of IV contrast. While this finding could reflect sequelae of prior treatment, possible locally recurrent tumor could also have  this appearance. Correlation with direct visualization recommended. No adenopathy. 2. 1.2 cm spiculated right upper lobe nodule, with a few additional pleural based nodules at the posterior right upper lobe as above. Findings are indeterminate. Consider one of the following in 3 months for both low-risk and high-risk individuals: (a) repeat chest CT, (b) follow-up PET-CT, or (c) tissue sampling. This recommendation follows the consensus statement: Guidelines for Management of Incidental Pulmonary Nodules Detected on CT Images: From the Fleischner Society 2017; Radiology 2017; 284:228-243. 3. Mild chronic frontoethmoidal sinusitis. No CT evidence for active disease at this time. 4. Two small approximate 1 cm soft tissue density lesions involving the right parotid gland as above, indeterminate. Nonemergent outpatient ENT referral for further workup and evaluation suggested. 5. Aortic Atherosclerosis (ICD10-I70.0) and Emphysema (ICD10-J43.9). Electronically Signed   By: Jeannine Boga M.D.   On: 07/02/2021 03:11   CT Angio Chest PE W and/or Wo Contrast  Result Date: 07/01/2021 CLINICAL DATA:  Shortness of breath.  Nausea and vomiting. High probability for pulmonary embolus. EXAM: CT ANGIOGRAPHY CHEST WITH CONTRAST TECHNIQUE: Multidetector CT imaging of the chest was performed using the standard protocol during bolus administration of intravenous contrast. Multiplanar CT image reconstructions and MIPs were obtained to evaluate the vascular anatomy. Performed concurrently with abdominopelvic CT, reported separately. CONTRAST:  162mL OMNIPAQUE IOHEXOL 350 MG/ML SOLN COMPARISON:  Chest radiograph earlier today.  Chest CT 05/18/2018 FINDINGS: Cardiovascular: There are no filling defects within the pulmonary arteries to suggest pulmonary embolus. Please note that apparent filling defects in the right upper lobe represent venous mixing and is within pulmonary veins, not pulmonary arteries. The heart is normal in  size. Borderline aneurysmal dilatation of the ascending aorta at 4 cm. There is diffuse aortic atherosclerosis. Cannot assess for aortic dissection given phase of contrast tailored to pulmonary artery evaluation. There are coronary artery calcifications. Small pericardial effusion anteriorly measures 10 mm in depth. Mediastinum/Nodes: Calcified mediastinal and hilar lymph nodes consistent with prior granulomatous disease. There is no noncalcified adenopathy. No suspicious thyroid nodule. No esophageal wall thickening. Lungs/Pleura: Moderate emphysema. Multiple calcified nodules throughout both lungs consistent with prior granulomatous disease. There is central bronchial thickening with areas of bronchial filling in the left lower lobe. No acute airspace disease. No pleural fluid or findings of pulmonary edema. Scarring and cystic changes in the inferior left lower lobe appears similar to prior exam. Additional areas of scarring in the subpleural right lower lobe and scattered throughout the right upper lobe. There is a stable spiculated nodular density in the right upper lobe measuring 8 x 5 mm, series 6, image 36, stable from 2019 and likely related to scarring. Perifissural nodule on the left, series 6, image 92, measuring 4 mm, unchanged. Upper Abdomen: Assessed on concurrent abdominal CT, reported separately. Musculoskeletal: Moderate T12 compression fracture, present on 03/01/2020 abdominal CT with progressive loss of height of the superior endplate. There is a mild T8 superior endplate compression fracture that is new.  Mild T4 superior endplate compression fracture is also new. No focal bone lesion. Review of the MIP images confirms the above findings. IMPRESSION: 1. No pulmonary embolus. 2. Advanced emphysema. Lower lobe bronchial thickening with areas of mucous plugging/bronchial filling in the left lower lobe. 3. Areas of nodular scarring within both lungs, upper lobe predominant, similar to 2019. No  suspicious pulmonary nodule. 4. Sequela of prior granulomatous disease with multiple calcified pulmonary nodules and calcified mediastinal and hilar lymph nodes. 5. Compression fractures of T4 and T8 vertebral bodies are new from 2019. Compression fracture of T12, most present on 03/01/2020 abdominal CT, but increased loss of height since that time. Aortic Atherosclerosis (ICD10-I70.0) and Emphysema (ICD10-J43.9). Electronically Signed   By: Keith Rake M.D.   On: 07/01/2021 21:23   CT ABDOMEN PELVIS W CONTRAST  Result Date: 07/01/2021 CLINICAL DATA:  Nausea and vomiting. EXAM: CT ABDOMEN AND PELVIS WITH CONTRAST TECHNIQUE: Multidetector CT imaging of the abdomen and pelvis was performed using the standard protocol following bolus administration of intravenous contrast. CONTRAST:  113mL OMNIPAQUE IOHEXOL 350 MG/ML SOLN COMPARISON:  CT 03/01/2020 FINDINGS: Lower chest: Assessed on concurrent chest CT, reported separately. Hepatobiliary: Small cysts in the liver. No suspicious liver lesion. Hepatic steatosis. Gallbladder physiologically distended, no calcified stone. No biliary dilatation. Pancreas: No ductal dilatation or inflammation. Spleen: Stable 15 mm cyst in the periphery of the spleen. No splenomegaly. Adrenals/Urinary Tract: Stable 3.2 cm heterogeneous right adrenal nodule. Normal left adrenal gland. Mixed density lesion in the left mid kidney has enhancing components and has increased in size from prior exam. This currently measures 2.7 x 2.7 cm, series 5, image 25, previously 2 cm, suspicious for renal neoplasm. Additional simple cysts in both kidneys. No hydronephrosis. No visualized renal calculi. Unremarkable urinary bladder. Stomach/Bowel: The stomach is decompressed. No evidence of gastric wall thickening or perigastric inflammation. No Peri duodenal inflammation. No bowel obstruction or inflammation. Diffuse colonic stool with moderate stool burden and colonic tortuosity. Left colonic  diverticulosis without diverticulitis. Air and stool distends the rectum, rectal distention of 6.8 cm. Vascular/Lymphatic: Infrarenal IVC filter in place. Advanced aortic and branch atherosclerosis. Infrarenal aortic aneurysm measuring 3.8 x 3.4 cm, previously 3.4 x 3.3 cm. There is eccentric mural thrombus posteriorly. No periaortic stranding or inflammation. The portal vein is patent. No abdominopelvic adenopathy. No retroperitoneal adenopathy. Reproductive: Unremarkable prostate gland. Other: No ascites or free air. No abdominopelvic collection. No abdominal wall hernia. Musculoskeletal: Right hip arthroplasty. T12 compression fracture was seen on prior exam but with progressive loss of height from prior. The bones are diffusely under mineralized. No focal bone lesion. IMPRESSION: 1. No acute abnormality in the abdomen/pelvis. 2. Mixed density lesion in the left mid kidney has increased in size from prior exam, currently measuring 2.7 x 2.7 cm, previously 2 cm, highly suspicious for renal neoplasm. 3. Moderate stool burden and colonic tortuosity, suggesting constipation. Air and stool distends the rectum, rectal distention of 6.8 cm. 4. Infrarenal aortic aneurysm measuring 3.8 x 3.4 cm, previously 3.4 x 3.3 cm. Recommend follow-up every 2 years. This recommendation follows ACR consensus guidelines: White Paper of the ACR Incidental Findings Committee II on Vascular Findings. J Am Coll Radiol 2013; 33:825-053. 5. Stable 3.2 cm right adrenal nodule. 6. T12 compression fracture was seen on prior exam but with progressive loss of height in the interim. 7. Advanced aortic and branch atherosclerosis. 8. Additional chronic incidental findings are stable as described. Aortic Atherosclerosis (ICD10-I70.0). Electronically Signed   By: Keith Rake  M.D.   On: 07/01/2021 21:29   DG Chest Portable 1 View  Result Date: 07/01/2021 CLINICAL DATA:  Shortness of breath EXAM: PORTABLE CHEST 1 VIEW COMPARISON:  08/29/2019  FINDINGS: Calcified right upper lung nodule. Linear scarring or atelectasis left base. No consolidation or effusion. Normal cardiomediastinal silhouette with aortic atherosclerosis. IMPRESSION: No active disease. Streaky scarring or atelectasis at the left base. Electronically Signed   By: Donavan Foil M.D.   On: 07/01/2021 18:15   CT MAXILLOFACIAL WO CONTRAST  Result Date: 07/02/2021 CLINICAL DATA:  Initial evaluation for chronic sinusitis, history of vocal cord cancer. EXAM: CT MAXILLOFACIAL WITHOUT CONTRAST CT NECK WITHOUT CONTRAST TECHNIQUE: Multidetector CT imaging of the neck was performed following the standard protocol without intravenous contrast. COMPARISON:  None available. FINDINGS: Pharynx and larynx: Visualized oral cavity within normal limits. No visible acute abnormality about the dentition. Oropharynx and nasopharynx within normal limits. No retropharyngeal collection or swelling. Epiglottis normal. Vallecula clear. There is asymmetric soft tissue density/fullness at the base of the right aryepiglottic fold extending into the right piriformis sinus (series 3, image 61). Difficult to measure or define a discrete mass within this region given lack of IV contrast. Possible involvement of the right false cord and laryngeal ventricle. Asymmetric sclerosis with medialization of the right arytenoid cartilage and right true cord, which also may be involved (series 3, image 66). Asymmetric infiltration within the right paraglottic fat (series 3, image 62) no definite erosive changes of the adjacent thyroid cartilage. Contralateral left aryepiglottic fold within normal limits. Left piriform sinus clear. Subglottic airway patent clear. Salivary glands: Apparent 1.1 cm soft tissue density lesion at the superior aspect of the right parotid gland noted (series 3, image 13). Finding is closely approximated to adjacent vascular structures, and could reflect a portion of a non-opacified vein, not well assessed  on this noncontrast examination. There is an additional 1.2 cm lesion with central calcification along the posterior margin of the right parotid gland inferiorly (series 3, image 30), indeterminate. Left parotid gland unremarkable. Submandibular glands within normal limits. Thyroid: Normal. Lymph nodes: No enlarged or pathologic adenopathy seen on this noncontrast examination. Vascular: Moderate atheromatous change about the visualized aortic arch and proximal great vessels, carotid bifurcations, and skull base. Limited intracranial: Age-related cerebral atrophy. Otherwise unremarkable. Visualized orbits: Prior bilateral ocular lens replacement. Globes and orbital soft tissues demonstrate no acute finding. Mastoids and visualized paranasal sinuses: Chronic mucosal thickening noted about the left frontal sinus, with scattered mucoperiosteal thickening about the ethmoidal air cells bilaterally. Paranasal sinuses are otherwise clear. No air-fluid levels to suggest acute sinusitis. Mastoid air cells and middle ear cavities are well pneumatized and free of fluid. Skeleton: No visible acute osseous finding. No discrete or worrisome osseous lesions. Mild chronic compression deformity noted involving the T4 vertebral body. Moderate spondylosis noted at C6-7. Upper chest: Emphysematous changes noted within the visualized lungs. 1.2 cm spiculated nodular density with associated architectural distortion present at the right upper lobe, indeterminate. Few additional pleural base nodular densities measuring 7 mm (series 4, image 39) and 9 mm (series 4, image 26) seen at the posterior 0 medial right upper lobe. Visualized upper chest demonstrates no other acute finding. Other: None. IMPRESSION: 1. Asymmetric soft tissue density/fullness at the base of the right aryepiglottic fold extending into the right piriformis sinus, with possible involvement of the right glottis inferiorly as above. Difficult to measure or define a discrete  mass within this region given lack of IV contrast. While this finding could  reflect sequelae of prior treatment, possible locally recurrent tumor could also have this appearance. Correlation with direct visualization recommended. No adenopathy. 2. 1.2 cm spiculated right upper lobe nodule, with a few additional pleural based nodules at the posterior right upper lobe as above. Findings are indeterminate. Consider one of the following in 3 months for both low-risk and high-risk individuals: (a) repeat chest CT, (b) follow-up PET-CT, or (c) tissue sampling. This recommendation follows the consensus statement: Guidelines for Management of Incidental Pulmonary Nodules Detected on CT Images: From the Fleischner Society 2017; Radiology 2017; 284:228-243. 3. Mild chronic frontoethmoidal sinusitis. No CT evidence for active disease at this time. 4. Two small approximate 1 cm soft tissue density lesions involving the right parotid gland as above, indeterminate. Nonemergent outpatient ENT referral for further workup and evaluation suggested. 5. Aortic Atherosclerosis (ICD10-I70.0) and Emphysema (ICD10-J43.9). Electronically Signed   By: Jeannine Boga M.D.   On: 07/02/2021 03:11        Scheduled Meds:  enoxaparin (LOVENOX) injection  40 mg Subcutaneous Q24H   guaiFENesin  10 mL Oral Q8H   ipratropium-albuterol  3 mL Nebulization Q6H   methylPREDNISolone (SOLU-MEDROL) injection  40 mg Intravenous Q12H   mometasone-formoterol  2 puff Inhalation BID   umeclidinium bromide  1 puff Inhalation Daily   Continuous Infusions:  sodium chloride     cefTRIAXone (ROCEPHIN)  IV       LOS: 1 day    Time spent: 35 minutes.     Elmarie Shiley, MD [facility]   If 7PM-7AM, please contact night-coverage [email or web address]  07/02/2021, 8:58 AM

## 2021-07-02 NOTE — ED Notes (Signed)
MD at bedside. 

## 2021-07-02 NOTE — Progress Notes (Signed)
Christopher Santiago is POD #0 s/p microlaryngoscopy and tracheostomy with Shiley 6 proximal XLT trach  Plan: -To ICU for observation, wean vent support as tolerated -OK to deflate cuff tomorrow if not requiring vent support -Keep obturator taped to head of bed, keep spare shiley trach at bedside -Recommend SLP evaluation once cuff deflated -FU operative biopsies -Begin routine trach care tomorrow -Do NOT cut trach sutures until POD #5, plan for exchange to cuffless trach on POD #5 if stable  -Medical management as per primary team  Thank you for allowing me to participate in the care of this patient. Please do not hesitate to contact me with any questions or concerns.   Jason Coop, [facility] Cell: [phone]

## 2021-07-02 NOTE — Consult Note (Signed)
NAME:  Christopher Santiago, MRN:  [id number], DOB:  28-Oct-1931, LOS: 1 ADMISSION DATE:  07/01/2021, CONSULTATION DATE:  07/02/2021  REFERRING MD:  Tyrell Antonio TRH, CHIEF COMPLAINT: Dyspnea and dysphagia  Brief History   Christopher Santiago is an 87 year old male with history of laryngeal cancer (~30 years ago) and COPD who presented to the ED on 07/01/21 for progressive dyspnea and dysphagia over the past few months and was found to have laryngeal narrowing due to mucosal abnormalities and edema. Plan is for biopsy and tracheostomy on 8/10 by ENT   History of present illness   Christopher Santiago is an 87 year old male with history of laryngeal cancer (~30 years ago) and COPD who presented to the ED on 07/01/21 for progressive dyspnea and dysphagia over the past few months. Imaging revealed thickening of right aryepiglottic fold into right pyriform. ENT was consulted and he underwent laryngoscopy which showed airway narrowing and mucosal abnormalities of the right true vocal cord. He agreed to undergoing biopsy for further evaluation.  Due to the nature of the airway narrowing and risk for obstruction should edema occur, it was recommended he undergo tracheostomy.   Past Medical History   Past Medical History:  Diagnosis Date   Adrenal adenoma, right    Anemia    Aortic atherosclerosis ([place])    Ascending aortic aneurysm ([place])    a. CT chest 12/16: 4.1 cm; b. CT chest 12/17: 4.2 cm; c. CT chest 12/18: 3.9 cm   Benign prostatic hypertrophy    Bradycardia    CAD (coronary artery disease)    a. LHC 11/06: pLAD 30-40%, in the p-mLAD right at takeoff of D2 50% stenosis, mLAD 40%, lower branch of D1 99% w/ subtotal occlusion, mLCx 30% followed by 50-60% at takeoff of OM2, dLCx 30% upper branch of large ramus 60-70%, mOM1 50% followed by 70-80%, pOM2 50%, pRCA 40%, mRCA 40%, dRCA 40%, med Rx   Cancer [facility])    vocal cord - followed by Dr.Newman - right vocal cord biopsy, polyp b-9, vocal cord excision of nodule    COPD  (chronic obstructive pulmonary disease) ([place])    Depression    Diabetes mellitus    no longer on medication for 5 years   Diverticulosis, sigmoid    Duodenal ulcer with hemorrhage 02/2020   Indomethacin   DVT (deep venous thrombosis) ([place])    a. s/p IVC filter   Grade I diastolic dysfunction 01/77/9390   Noted on ECHO   Hyperlipidemia    Hypertension    Incomplete RBBB 09/22/2018   noted on EKG   Left anterior fascicular block 09/22/2018   Noted on EKG   Lung nodules    LVH (left ventricular hypertrophy) 02/11/2018   Mild, noted on ECHO   MR (mitral regurgitation) 02/11/2018   Mild to Moderate, noted on ECHO   OA (osteoarthritis)    OSA (obstructive sleep apnea)    sleep study - mild sleep apnea- cpap - polysomnogram    PE (pulmonary embolism) 10/2014   a. s/p IVC filter   Peripheral vascular disease ([place])    Pulmonary hypertension ([place])    a. TTE 2015: EF 30-09%, diastolic dysfunction, mild concentric LVH, aortic sclerosis without stenosis, mildly elevated PASP at 43 mmHg, mild TR   Trimalleolar fracture      Significant Hospital Events   8/9--admitted 8/10--laryngeal biopsy and tracheostomy  Consults:  PCCM ENT   Significant Diagnostic Tests:  8/9 CXR>>RUL nodule 8/9 CTA chest>>emphysema with  lower lobe bronchial thickening and mucous plugging   Micro Data:    Antimicrobials:     Interim history/subjective:    Objective   Blood pressure (!) 134/117, pulse 88, temperature (!) 97.4 F (36.3 C), temperature source Oral, resp. rate 14, height 5\' 6"  (1.676 m), weight 77.1 kg, SpO2 98 %.       No intake or output data in the 24 hours ending 07/02/21 1358 Filed Weights   07/02/21 0437  Weight: 77.1 kg    Examination: General: Elderly man, sitting up in bed, no distress HENT: Hoarse voice, mild pallor, no icterus, no JVD, no lymphadenopathy Lungs: Barrel chest, decreased breath sounds bilateral Cardiovascular: S1-S2 distant, regular Abdomen: Soft,  nontender Extremities: No edema, no deformity, bruising over both shins Neuro: Alert, interactive, hard of hearing, nonfocal   Chest x-ray 8/9 independently reviewed shows mild hyperinflation, otherwise clear  Resolved Hospital Problem list     Assessment & Plan:  Concern here is for recurrence of laryngeal cancer.  Plan is for laryngoscopy with biopsy and elective tracheostomy in anticipation of upper airway obstruction postprocedure -Tracheostomy care per ENT postoperatively -I expect him to wean to trach collar  COPD -can use duo nebs during his hospital stay If bronchospasm noted, can add Pulmicort and Brovana No PFTs available at present, he does not seem to be on maintenance inhalers   PCCM will be available as needed postoperatively, please call if he does not do as well as expected  Best practice:  Diet: N.p.o.  DVT prophylaxis: Can start subcu heparin postop GI prophylaxis: N/A  Code Status: Full Family Communication: Daughter [name] at bedside Disposition: Okay to place in ICU for postop monitoring  Labs   CBC: Recent Labs  Lab 07/01/21 1729 07/02/21 0403  WBC 7.1 6.4  NEUTROABS 4.9  --   HGB 11.4* 11.4*  HCT 37.5* 36.4*  MCV 98.9 97.6  PLT 197 409    Basic Metabolic Panel: Recent Labs  Lab 07/01/21 1729 07/02/21 0403  NA 140 139  K 3.9 3.8  CL 102 99  CO2 29 29  GLUCOSE 87 150*  BUN 26* 25*  CREATININE 1.19 1.21  CALCIUM 9.3 9.3   GFR: Estimated Creatinine Clearance: 41.2 mL/min (by C-G formula based on SCr of 1.21 mg/dL). Recent Labs  Lab 07/01/21 1729 07/02/21 0403  WBC 7.1 6.4    Liver Function Tests: Recent Labs  Lab 07/01/21 1729  AST 18  ALT 10  ALKPHOS 39  BILITOT 0.9  PROT 6.6  ALBUMIN 3.2*   No results for input(s): LIPASE, AMYLASE in the last 168 hours. No results for input(s): AMMONIA in the last 168 hours.  ABG    Component Value Date/Time   PHART 7.452 (H) 02/10/2016 0831   PCO2ART 38.7 02/10/2016 0831    PO2ART 66.0 (L) 02/10/2016 0831   HCO3 27.1 (H) 02/10/2016 0831   TCO2 28 02/10/2016 0831   O2SAT 94.0 02/10/2016 0831     Coagulation Profile: No results for input(s): INR, PROTIME in the last 168 hours.  Cardiac Enzymes: No results for input(s): CKTOTAL, CKMB, CKMBINDEX, TROPONINI in the last 168 hours.  HbA1C: Hemoglobin A1C  Date/Time Value Ref Range Status  11/05/2014 05:54 AM 6.3 4.2 - 6.3 % Final    Comment:    The American Diabetes Association recommends that a primary goal of therapy should be <7% and that physicians should reevaluate the treatment regimen in patients with HbA1c values consistently >8%.    Hgb A1c MFr  Bld  Date/Time Value Ref Range Status  11/08/2018 02:51 PM 5.3 4.8 - 5.6 % Final    Comment:    (NOTE) Pre diabetes:          5.7%-6.4% Diabetes:              >6.4% Glycemic control for   <7.0% adults with diabetes   08/09/2018 01:00 PM 6.2 4.6 - 6.5 % Final    Comment:    Glycemic Control Guidelines for People with Diabetes:Non Diabetic:  <6%Goal of Therapy: <7%Additional Action Suggested:  >8%     CBG: No results for input(s): GLUCAP in the last 168 hours.  Review of Systems:   Complains of dyspnea, dysphagia Hoarse voice    Constitutional: negative for anorexia, fevers and sweats  Eyes: negative for irritation, redness and visual disturbance  Ears, nose, mouth, throat, and face: negative for earaches, epistaxis, nasal congestion and sore throat  Respiratory: negative for cough, sputum and wheezing  Cardiovascular: negative for chest pain, lower extremity edema, orthopnea, palpitations and syncope  Gastrointestinal: negative for abdominal pain, constipation, diarrhea, melena, nausea and vomiting  Genitourinary:negative for dysuria, frequency and hematuria  Hematologic/lymphatic: negative for bleeding, easy bruising and lymphadenopathy  Musculoskeletal:negative for arthralgias, muscle weakness and stiff joints  Neurological: negative for  coordination problems, gait problems, headaches and weakness  Endocrine: negative for diabetic symptoms including polydipsia, polyuria and weight loss   Past Medical History  He,  has a past medical history of Adrenal adenoma, right, 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/2016), Anemia, Aortic atherosclerosis ([place]), Ascending aortic aneurysm ([facility]), Benign prostatic hypertrophy, Bradycardia, CAD (coronary artery disease), Cancer ([place]), COPD (chronic obstructive pulmonary disease) ([place]), Depression, Diabetes mellitus, Diverticulosis, sigmoid, Dizziness, Duodenal ulcer with hemorrhage (02/2020), DVT (deep venous thrombosis) ([facility]), Dyspnea, Fatty liver, Gait instability, Grade I diastolic dysfunction (37/08/6268), History of colon polyps, History of inguinal hernia, History of kidney stones, Hyperlipidemia, Hypertension, Hypogonadism male, Incomplete RBBB (09/22/2018), Left anterior fascicular block (09/22/2018), Lower urinary tract symptoms (LUTS), Lung nodules, LVH (left ventricular hypertrophy) (02/11/2018), MR (mitral regurgitation) (02/11/2018), Multiple contusions, Multiple gastric ulcers (02/2020), Nasal drainage, Nocturia, OA (osteoarthritis), OSA (obstructive sleep apnea), PE (pulmonary embolism) (10/2014), Peripheral vascular disease ([place]), Persistent cough, Pneumonia, Pulmonary hypertension ([place]), and Trimalleolar fracture.   Surgical History    Past Surgical History:  Procedure Laterality Date   ANKLE FRACTURE SURGERY Left    unsure if there is metal in the ankle   BIOPSY  03/02/2020   Procedure: BIOPSY;  Surgeon: Yetta Flock, MD;  Location: [facility];  Service: Gastroenterology;;   CARDIAC CATHETERIZATION  Oct 08, 2005   dead spot, two small occlusions , EF 50-55-55% mild -Mod Dz    CATARACT EXTRACTION W/ INTRAOCULAR LENS  IMPLANT, BILATERAL     COLONOSCOPY W/ POLYPECTOMY  04/20/2010   CYSTOSCOPY WITH URETHRAL DILATATION N/A 11/10/2018   Procedure: CYSTOSCOPY WITH URETHRAL DILATATION;  Surgeon:  Irine Seal, MD;  Location: [facility];  Service: Urology;  Laterality: N/A;   ESOPHAGOGASTRODUODENOSCOPY (EGD) WITH PROPOFOL N/A 03/02/2020   Procedure: ESOPHAGOGASTRODUODENOSCOPY (EGD) WITH PROPOFOL;  Surgeon: Yetta Flock, MD;  Location: [facility];  Service: Gastroenterology;  Laterality: N/A;   HERNIA REPAIR  07/04/01   right side inguinal   HOT HEMOSTASIS N/A 03/02/2020   Procedure: HOT HEMOSTASIS (ARGON PLASMA COAGULATION/BICAP);  Surgeon: Yetta Flock, MD;  Location: [facility];  Service: Gastroenterology;  Laterality: N/A;   LARYNGOSCOPY     with biopsy, right vocal cord lesion    OMENTECTOMY     age 8  PROSTATE SURGERY     not full excision   SCLEROTHERAPY  03/02/2020   Procedure: SCLEROTHERAPY;  Surgeon: Yetta Flock, MD;  Location: [facility];  Service: Gastroenterology;;   TOTAL HIP ARTHROPLASTY Right 04/11/2018   Procedure: TOTAL HIP ARTHROPLASTY ANTERIOR APPROACH;  Surgeon: Lovell Sheehan, MD;  Location: [facility];  Service: Orthopedics;  Laterality: Right;   TRANSURETHRAL RESECTION OF PROSTATE N/A 11/10/2018   Procedure: TRANSURETHRAL RESECTION OF THE PROSTATE (TURP);  Surgeon: Irine Seal, MD;  Location: [facility];  Service: Urology;  Laterality: N/A;   VENA CAVA FILTER PLACEMENT  2015     Social History   reports that he quit smoking about 27 years ago. His smoking use included cigarettes. He has a 79.50 pack-year smoking history. He has never used smokeless tobacco. He reports current alcohol use of about 1.0 - 2.0 standard drink of alcohol per week. He reports that he does not use drugs.   Family History   His family history includes Diabetes in his mother; Hypertension in his mother. There is no history of Stroke, Colon cancer, or Prostate cancer.   Allergies Allergies  Allergen Reactions   Tessalon [Benzonatate] Other (See Comments)    Ineffective, nasal congestion     Home Medications  Prior to Admission medications   Medication Sig  Start Date End Date Taking? Authorizing Provider  albuterol (VENTOLIN HFA) 108 (90 Base) MCG/ACT inhaler Inhale 1-2 puffs into the lungs every 6 (six) hours as needed for wheezing or shortness of breath (okay to fill with albuterol/proair/ventolin). 04/01/20  Yes Tonia Ghent, MD  allopurinol (ZYLOPRIM) 100 MG tablet TAKE 1 TABLET BY MOUTH ONCE A DAY Patient taking differently: Take 100 mg by mouth daily. 05/16/21  Yes Tonia Ghent, MD  aspirin EC 81 MG tablet Take 81 mg by mouth daily.   Yes [provider]  Cholecalciferol 2000 units CAPS Take 2,000 Units by mouth daily.    Yes [provider]  citalopram (CELEXA) 40 MG tablet TAKE 1 AND 1/2 TABLETS BY MOUTH ONCE A DAY Patient taking differently: Take 60 mg by mouth daily. 05/16/21  Yes Tonia Ghent, MD  DOK 100 MG capsule TAKE 1 CAPSULE BY MOUTH TWICE DAILY Patient taking differently: Take 100 mg by mouth 2 (two) times daily. 10/15/20  Yes Tonia Ghent, MD  furosemide (LASIX) 40 MG tablet TAKE 1 TABLET BY MOUTH TWICE A DAY AS NEEDED SHORTNESS OF BREATH Patient taking differently: Take 40 mg by mouth daily. 03/31/21  Yes Tonia Ghent, MD  guaiFENesin (MUCINEX) 600 MG 12 hr tablet Take 600 mg by mouth 2 (two) times daily as needed for cough or to loosen phlegm.   Yes [provider]  metoprolol succinate (TOPROL-XL) 25 MG 24 hr tablet TAKE 1/2 TABLETS BY MOUTH ONCE DAILY Patient taking differently: Take 12.5 mg by mouth daily. 12/26/20  Yes Gollan, Kathlene November, MD  pantoprazole (PROTONIX) 40 MG tablet TAKE 1 TABLET BY MOUTH TWICE A DAY BEFORE A MEAL Patient taking differently: Take 40 mg by mouth 2 (two) times daily. 05/16/21  Yes Tonia Ghent, MD      Kara Mead MD. FCCP. [facility] Pager : [phone]  If no response to pager , please call [phone] until 7 pm After 7:00 pm call Elink  [phone]   07/02/2021  1:58 PM

## 2021-07-02 NOTE — Op Note (Signed)
OPERATIVE NOTE  Christopher Santiago Date/Time of Admission: 07/01/2021  5:18 PM  CSN: [id number];WUJ:[id number] Attending Provider: Elmarie Shiley, MD Room/Bed: MCPO/NONE DOB: 01-09-1931 Age: 87 y.o.   Pre-Op Diagnosis: CHRONIC RESPIRATORY FAILURE  Post-Op Diagnosis: CHRONIC RESPIRATORY FAILURE  Procedure: Procedure(s): MICROLARYNGOSCOPY WITH BIOPSY TRACHEOSTOMY  Anesthesia: General  Surgeon(s): [place], DO  Staff: Circulator: Bartholomew Boards, RN Scrub Person: Rico Ala Circulator Assistant: Zannie Kehr, RN  Implants: * No implants in log *  Specimens: ID Type Source Tests Collected by Time Destination  1 : right arytenoid  Tissue PATH Soft tissue SURGICAL PATHOLOGY Christopher Santiago A, DO 9/56/2130 [id number]     Complications: None  EBL: <10 ML  Condition: stable  Operative Findings:  Mucosal abnormality of the right arytenoid, with apparent extension into the right pyriform recess. No obvious mucosal abnormality of the right true vocal fold. Edema of the false cord with no exophytic lesion appreciated.   Description of Operation: Once operative consent was obtained and the site and surgery were confirmed with the patient and the operating room team, the patient was brought back to the operating room intubated. Once the patient was properly sedated, the patient was turned over to the ENT service. Santiago 2 cm incision was planned horizontally in the neck above the sternal notch in the midline. The incision was injected with 5 ml of 1% lidocaine with 1:100,000 epinephrine. The patient was prepped and draped in clean fashion. Santiago #15 blade scalpel was then used to incise the skin and subcutaneous tissue along the planned incision. The subcutaneous tissue and strap musculature were divided through the midline raphae with Bovie electrocautery. The thyroid isthmus was identified and also divided with Bovie electrocautery.  Education officer, museum were used to  retract the soft tissues and then the patient's anterior trachea was identified and cleaned with Santiago Kitner. The second tracheal ring was identified.  Meticulous hemostasis was achieved using bipolar cautery.  Santiago 15 blade scalpel was used to incise the space between the first and second trachel ring and the airway was suctioned free of any mucus and blood.The endotracheal tube was withdrawn under direct visualization.  Santiago Size 6 Santiago tracheostomy tube was inserted without difficulty and the patient's airway was stabilized.  The anesthesia circuit was then connected and good gas exchange was confirmed. The tracheostomy tube was sutured in place with 2-0 Silk sutures and Santiago trach tie. Once again, the surgical site was inspected for any active bleeding, and hemostasis was confirmed. At this time, tidal volumes were notes to be low and an air leak was audible.The tracheostomy was removed, and it was decided to place Santiago Proximal XLT tracheostomy, due the the severity of the patient's kyphosis. Santiago size 6 cuffed proximal XLT tracheostomy was inserted without difficulty and the patient's airway was stabilized. The anesthesia circuit was then connected and good gas exchange was confirmed, as well as excellent tidle volumes. No air escape was appreciated. The tracheostomy tube was sutured in place with 2-0 Silk sutures and Santiago trach tie. Once again, the surgical site was inspected for any active bleeding, and hemostasis was confirmed. The patient's bed was then rotated 90 degrees and an operating laryngoscope was used to directly visualize the upper airway and glottis. All anatomic areas from the oral cavity to the glottis were examined and noted to be normal with only exceptions noted in this report. Areas examined included the oropharynx, vallecula, both surfaces of the epiglottis, glottis, post cricoid region and  bilateral pyriform sinuses.   The patient was placed in laryngeal suspension with the focus on the glottis with the ET  tube intact. An zero degree endoscope was used to visualize the mucosal abnormality of the right arytenoid. Several biopsies were obtained and sent to pathology as Santiago permanent specimen. Hemostasis was obtained with an Afrin soaked pledget. An oral gastric tube was placed into the stomach and suctioned to reduce postoperative nausea. The patient was turned back over to the anesthesia service. The patient was transferred to the PACU in stable condition.    Christopher Santiago, [facility]  07/02/2021

## 2021-07-02 NOTE — Evaluation (Addendum)
Physical Therapy Evaluation Patient Details Name: Christopher Santiago MRN: [id number] DOB: September 26, 1931 Today's Date: 07/02/2021   History of Present Illness  Pt is an 87 y/o male admitted secondary to worsening SOB, especially with exertion. Found to have acute respiratory failure with hypoxia. Imaging showed fullness of the right aryepiglottic fold extending into the right pyriform and L renal mass. Pt had Flexible laryngoscopy on 8/10. Workup pending. PMH includes vocal cord cancer, COPD, CAD, DM, HTN, and R THA.  Clinical Impression  Pt admitted secondary to problem above with deficits below. Pt requiring min A to take steps at EOB. Increased SOB noted and sats decreased to 88% on 2L. Increased to >90% on 2L with seated rest. DOE at 3/4. Mobility limited as ENT present to perform procedure. Will need to progress mobility further to determine most appropriate d/c recommendations. 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 Will continue to follow acutely and update recommendations based on pt progression.     Follow Up Recommendations Supervision for mobility/OOB;Other (comment) (TBD pending progression)    Equipment Recommendations  Other (comment) (TBD pending progression)    Recommendations for Other Services       Precautions / Restrictions Precautions Precautions: Fall Precaution Comments: Watch O2. Restrictions Weight Bearing Restrictions: No      Mobility  Bed Mobility Overal bed mobility: Needs Assistance Bed Mobility: Supine to Sit;Sit to Supine     Supine to sit: Supervision Sit to supine: Supervision   General bed mobility comments: Supervision for safety.    Transfers Overall transfer level: Needs assistance Equipment used: 1 person hand held assist Transfers: Sit to/from Stand Sit to Stand: Min assist         General transfer comment: Min A for steadying to stand from  stretcher.  Ambulation/Gait Ambulation/Gait assistance: Min assist Gait Distance (Feet): 2 Feet Assistive device: 1 person hand held assist Gait Pattern/deviations: Step-through pattern;Decreased stride length Gait velocity: Decreased   General Gait Details: Mild unsteadiness noted when taking steps at EOB. Pt with increased SOB and oxygen sats briefly dropping to 88% on 2L. Further mobility deferred as ENT present.  Stairs            Wheelchair Mobility    Modified Rankin (Stroke Patients Only)       Balance Overall balance assessment: Needs assistance Sitting-balance support: No upper extremity supported;Feet supported Sitting balance-Leahy Scale: Fair     Standing balance support: Single extremity supported Standing balance-Leahy Scale: Poor Standing balance comment: Reliant on UE and external support                             Pertinent Vitals/Pain Pain Assessment: No/denies pain    Home Living Family/patient expects to be discharged to:: Private residence Living Arrangements: Spouse/significant other Available Help at Discharge: Family Type of Home: House Home Access: Stairs to enter Entrance Stairs-Rails: Psychiatric nurse of Steps: 3 Home Layout: One level Home Equipment: Environmental consultant - 4 wheels Additional Comments: Spouse is disabled and pt reqports they have no one else to assist.    Prior Function Level of Independence: Independent with assistive device(s)         Comments: Is the caregiver for his wife. Uses rollator for ambulation. Reports he has to take frequent rest breaks.     Hand Dominance  Extremity/Trunk Assessment   Upper Extremity Assessment Upper Extremity Assessment: Defer to OT evaluation    Lower Extremity Assessment Lower Extremity Assessment: Generalized weakness    Cervical / Trunk Assessment Cervical / Trunk Assessment: Kyphotic  Communication   Communication: Other (comment) (very  hoarse)  Cognition Arousal/Alertness: Awake/alert Behavior During Therapy: WFL for tasks assessed/performed Overall Cognitive Status: Within Functional Limits for tasks assessed                                        General Comments General comments (skin integrity, edema, etc.): no family present    Exercises     Assessment/Plan    PT Assessment Patient needs continued PT services  PT Problem List Decreased strength;Decreased activity tolerance;Decreased balance;Decreased mobility;Decreased knowledge of use of DME;Decreased knowledge of precautions;Cardiopulmonary status limiting activity       PT Treatment Interventions DME instruction;Gait training;Functional mobility training;Stair training;Therapeutic activities;Balance training;Therapeutic exercise;Patient/family education    PT Goals (Current goals can be found in the Care Plan section)  Acute Rehab PT Goals Patient Stated Goal: to feel better PT Goal Formulation: With patient Time For Goal Achievement: 07/16/21 Potential to Achieve Goals: Good    Frequency Min 3X/week   Barriers to discharge        Co-evaluation               AM-PAC PT "6 Clicks" Mobility  Outcome Measure Help needed turning from your back to your side while in a flat bed without using bedrails?: None Help needed moving from lying on your back to sitting on the side of a flat bed without using bedrails?: A Little Help needed moving to and from a bed to a chair (including a wheelchair)?: A Little Help needed standing up from a chair using your arms (e.g., wheelchair or bedside chair)?: A Little Help needed to walk in hospital room?: A Little Help needed climbing 3-5 steps with a railing? : A Lot 6 Click Score: 18    End of Session Equipment Utilized During Treatment: Gait belt;Oxygen Activity Tolerance: Patient limited by fatigue Patient left: in bed;with call bell/phone within reach;Other (comment) (on stretcher in ED  with ENT present) Nurse Communication: Mobility status PT Visit Diagnosis: Unsteadiness on feet (R26.81);Difficulty in walking, not elsewhere classified (R26.2)    Time: 1120-1130 PT Time Calculation (min) (ACUTE ONLY): 10 min   Charges:   PT Evaluation $PT Eval Moderate Complexity: 1 Mod          Reuel Derby, PT, DPT  Acute Rehabilitation Services  Pager: [phone] Office: [phone]   Rudean Hitt 07/02/2021, 1:34 PM

## 2021-07-02 NOTE — Transfer of Care (Signed)
Immediate Anesthesia Transfer of Care Note  Patient: Christopher Santiago  Procedure(s) Performed: DIRECT LARYNGOSCOPY WITH BIOPSY (Throat) TRACHEOSTOMY (Neck)  Patient Location: PACU  Anesthesia Type:General  Level of Consciousness: drowsy  Airway & Oxygen Therapy: Patient Spontanous Breathing and Patient connected to face mask oxygen  Post-op Assessment: Report given to RN and Post -op Vital signs reviewed and stable  Post vital signs: Reviewed and stable  Last Vitals:  Vitals Value Taken Time  BP 167/72 07/02/21 1654  Temp    Pulse 76 07/02/21 1658  Resp 24 07/02/21 1658  SpO2 99 % 07/02/21 1658  Vitals shown include unvalidated device data.  Last Pain:  Vitals:   07/02/21 0945  TempSrc: Oral  PainSc:          Complications: No notable events documented.

## 2021-07-02 NOTE — H&P (View-Only) (Signed)
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 upper lung nodule. Linear scarring or atelectasis left base. No consolidation or effusion. Normal cardiomediastinal silhouette with aortic atherosclerosis. IMPRESSION: No active disease. Streaky scarring or atelectasis at the left base. Electronically Signed   By: Donavan Foil M.D.   On: 07/01/2021 18:15   CT MAXILLOFACIAL WO CONTRAST  Result Date: 07/02/2021 CLINICAL  DATA:  Initial evaluation for chronic sinusitis, history of vocal cord cancer. EXAM: CT MAXILLOFACIAL WITHOUT CONTRAST CT NECK WITHOUT CONTRAST TECHNIQUE: Multidetector CT imaging of the neck was performed following the standard protocol without intravenous contrast. COMPARISON:  None available. FINDINGS: Pharynx and larynx: Visualized oral cavity within normal limits. No visible acute abnormality about the dentition. Oropharynx and nasopharynx within normal limits. No retropharyngeal collection or swelling.  Epiglottis normal. Vallecula clear. There is asymmetric soft tissue density/fullness at the base of the right aryepiglottic fold extending into the right piriformis sinus (series 3, image 61). Difficult to measure or define a discrete mass within this region given lack of IV contrast. Possible involvement of the right false cord and laryngeal ventricle. Asymmetric sclerosis with medialization of the right arytenoid cartilage and right true cord, which also may be involved (series 3, image 66). Asymmetric infiltration within the right paraglottic fat (series 3, image 62) no definite erosive changes of the adjacent thyroid cartilage. Contralateral left aryepiglottic fold within normal limits. Left piriform sinus clear. Subglottic airway patent clear. Salivary glands: Apparent 1.1 cm soft tissue density lesion at the superior aspect of the right parotid gland noted (series 3, image 13). Finding is closely approximated to adjacent vascular structures, and could reflect a portion of a non-opacified vein, not well assessed on this noncontrast examination. There is an additional 1.2 cm lesion with central calcification along the posterior margin of the right parotid gland inferiorly (series 3, image 30), indeterminate. Left parotid gland unremarkable. Submandibular glands within normal limits. Thyroid: Normal. Lymph nodes: No enlarged or pathologic adenopathy seen on this noncontrast examination. Vascular: Moderate atheromatous change about the visualized aortic arch and proximal great vessels, carotid bifurcations, and skull base. Limited intracranial: Age-related cerebral atrophy. Otherwise unremarkable. Visualized orbits: Prior bilateral ocular lens replacement. Globes and orbital soft tissues demonstrate no acute finding. Mastoids and visualized paranasal sinuses: Chronic mucosal thickening noted about the left frontal sinus, with scattered mucoperiosteal thickening about the ethmoidal air cells bilaterally. Paranasal  sinuses are otherwise clear. No air-fluid levels to suggest acute sinusitis. Mastoid air cells and middle ear cavities are well pneumatized and free of fluid. Skeleton: No visible acute osseous finding. No discrete or worrisome osseous lesions. Mild chronic compression deformity noted involving the T4 vertebral body. Moderate spondylosis noted at C6-7. Upper chest: Emphysematous changes noted within the visualized lungs. 1.2 cm spiculated nodular density with associated architectural distortion present at the right upper lobe, indeterminate. Few additional pleural base nodular densities measuring 7 mm (series 4, image 39) and 9 mm (series 4, image 26) seen at the posterior 0 medial right upper lobe. Visualized upper chest demonstrates no other acute finding. Other: None. IMPRESSION: 1. Asymmetric soft tissue density/fullness at the base of the right aryepiglottic fold extending into the right piriformis sinus, with possible involvement of the right glottis inferiorly as above. Difficult to measure or define a discrete mass within this region given lack of IV contrast. While this finding could reflect sequelae of prior treatment, possible locally recurrent tumor could also have this appearance. Correlation with direct visualization recommended. No adenopathy. 2. 1.2 cm spiculated right upper lobe nodule, with a few additional pleural based nodules at the posterior right upper lobe as above. Findings are indeterminate. Consider one of the following in 3 months for both low-risk and high-risk individuals: (a) repeat chest CT, (b) follow-up PET-CT, or (c) tissue sampling. This recommendation follows the consensus statement: Guidelines for Management of Incidental Pulmonary Nodules Detected on CT Images: From the  Fleischner Society 2017; Radiology 2017; 284:228-243. 3. Mild chronic frontoethmoidal sinusitis. No CT evidence for active disease at this time. 4. Two small approximate 1 cm soft tissue density lesions  involving the right parotid gland as above, indeterminate. Nonemergent outpatient ENT referral for further workup and evaluation suggested. 5. Aortic Atherosclerosis (ICD10-I70.0) and Emphysema (ICD10-J43.9). Electronically Signed   By: Jeannine Boga M.D.   On: 07/02/2021 03:11    ROS:ROS  Blood pressure 116/65, pulse 73, temperature (!) 97.4 F (36.3 C), temperature source Oral, resp. rate 16, height 5\' 6"  (1.676 m), weight 77.1 kg, SpO2 98 %.  PHYSICAL EXAM: CONSTITUTIONAL: Thin, chronically ill appearing male, no distress and alert and oriented x 3 PULMONARY/CHEST WALL: increased work of breathing, on nasal cannula, noisy inspiration,  dysphonia HENT: Head : normocephalic and atraumatic Ears: Right ear:   canal normal, external ear normal and hearing normal Left ear:   canal normal, external ear normal and hearing normal Nose: nose normal and no purulence Mouth/Throat:  Mouth: uvula midline and no oral lesions Throat: oropharynx clear and moist Mucous membranes: normal EYES: conjunctiva normal, EOM normal and PERRL NECK: supple, trachea normal and no thyromegaly or cervical LAD  Procedure: Transnasal fiberoptic laryngoscopy Anesth: Topical with 4% lidocaine Compl: None Findings: Bilateral true vocal folds in paramedian position with limited excursion, near-complete closure with phonation. Mucosal irregularity of the margin of the right true cord noted. Right arytenoid and aryepiglottic fold edematous, with no exophytic lesion noted. Unable to visualize pyriform completely.  Description:  After discussing risks, benefits, and alternatives, the patient was placed in a seated position and the right nasal passage was sprayed with topical anesthetic.  The fiberoptic scope was passed through the right nasal passage to view the pharynx and larynx.  Findings are noted above.  The scope was then removed and he was returned to nursing care in stable condition.   Studies Reviewed:CT Neck  without contrast reviewed, maxillofacial CT reviewed. No evidence of chronic or acute sinusitis, asymmetric soft tissue thickening noted of right aryepiglottic fold into right pyriform. Possibly extending to glottis. Study limited by lack of contrast.   Assessment/Plan: Christopher Santiago is a 87 y/o M with PMHx of COPD and cancer of larynx treated ~ 30 years ago. No records available to review stage/treatment.  -Flexible laryngoscopy demonstrates bilateral true vocal folds in paramedian position with normal adduction, restricted abduction causing narrowing of the airway. No evidence of airway obstruction. Mucosal abnormality/ edema of right supraglottis/glottis noted with no exophytic mass lesion.  -Discussed findings with patient and his wife. We discussed that biopsy would be required to rule out recurrent malignancy. I expressed concern about the narrowing of his airway, and difficulty with extubation following procedure, or the potential for acute airway compromise if any edema should occur. I recommended direct laryngoscopy with biopsy and tracheostomy. Risks, benefits, alternatives and expected postoperative course and recovery were discussed with patient and his wife, who expressed understanding and agreement. Plan discussed with primary.   Thank you for allowing me to participate in the care of this patient. Please do not hesitate to contact me with any questions or concerns.   Jason Coop, [facility] Cell: [phone]   07/02/2021, 12:21 PM

## 2021-07-02 NOTE — Anesthesia Postprocedure Evaluation (Signed)
Anesthesia Post Note  Patient: Christopher Santiago  Procedure(s) Performed: DIRECT LARYNGOSCOPY WITH BIOPSY (Throat) TRACHEOSTOMY (Neck)     Patient location during evaluation: PACU Anesthesia Type: General Level of consciousness: sedated and patient cooperative Pain management: pain level controlled Vital Signs Assessment: post-procedure vital signs reviewed and stable Respiratory status: spontaneous breathing Cardiovascular status: stable Anesthetic complications: no   No notable events documented.  Last Vitals:  Vitals:   07/02/21 1709 07/02/21 1724  BP: (!) 171/81 (!) 180/68  Pulse: 77 78  Resp: 20 19  Temp:    SpO2: 99% 100%    Last Pain:  Vitals:   07/02/21 1724  TempSrc:   PainSc: 0-No pain                 Nolon Nations

## 2021-07-02 NOTE — Anesthesia Preprocedure Evaluation (Addendum)
Anesthesia Evaluation  Patient identified by MRN, date of birth, ID band Patient awake    Reviewed: Allergy & Precautions, NPO status , Patient's Chart, lab work & pertinent test results  Airway Mallampati: II  TM Distance: >3 FB Neck ROM: Full    Dental  (+) Dental Advisory Given   Pulmonary shortness of breath, sleep apnea , pneumonia, COPD, former smoker,    Pulmonary exam normal breath sounds clear to auscultation + decreased breath sounds + stridor     Cardiovascular hypertension, Pt. on home beta blockers and Pt. on medications pulmonary hypertension+ angina + CAD, + Peripheral Vascular Disease and +CHF  Normal cardiovascular exam+ dysrhythmias  Rhythm:Regular Rate:Normal  Echo 2020 1. Left ventricular ejection fraction, by visual estimation, is 55 to 60%. The left ventricle has normal function. Normal left ventricular size. Left ventricular septal wall thickness was normal. Normal left ventricular posterior wall thickness. There is no left ventricular hypertrophy.  2. Left ventricular diastolic Doppler parameters are consistent with impaired relaxation pattern of LV diastolic filling.  3. Global right ventricle has normal systolic function.The right ventricular size is normal. No increase in right ventricular wall thickness.  4. Moderately elevated pulmonary artery systolic pressure.  5. Left atrial size was normal.  6. Right atrial size was normal.    Neuro/Psych PSYCHIATRIC DISORDERS Depression negative neurological ROS     GI/Hepatic Neg liver ROS, PUD,   Endo/Other  diabetesHypothyroidism   Renal/GU negative Renal ROS     Musculoskeletal  (+) Arthritis ,   Abdominal   Peds  Hematology  (+) Blood dyscrasia, anemia ,   Anesthesia Other Findings   Reproductive/Obstetrics                            Anesthesia Physical Anesthesia Plan  ASA: 4 and emergent  Anesthesia Plan:  General   Post-op Pain Management:    Induction: Intravenous, Rapid sequence and Cricoid pressure planned  PONV Risk Score and Plan: 3 and Ondansetron, Dexamethasone and Treatment may vary due to age or medical condition  Airway Management Planned: Oral ETT and Video Laryngoscope Planned  Additional Equipment: None  Intra-op Plan:   Post-operative Plan: Possible Post-op intubation/ventilation  Informed Consent: I have reviewed the patients History and Physical, chart, labs and discussed the procedure including the risks, benefits and alternatives for the proposed anesthesia with the patient or authorized representative who has indicated his/her understanding and acceptance.     Dental advisory given  Plan Discussed with: CRNA  Anesthesia Plan Comments:      Anesthesia Quick Evaluation

## 2021-07-02 NOTE — Interval H&P Note (Signed)
History and Physical Interval Note:  07/02/2021 2:37 PM  Christopher Santiago  has presented today for surgery, with the diagnosis of CHRONIC RESPIRATORY FAILURE.  The various methods of treatment have been discussed with the patient and family. After consideration of risks, benefits and other options for treatment, the patient has consented to  Procedure(s): DIRECT LARYNGOSCOPY WITH BIOPSY (N/A) TRACHEOSTOMY (N/A) as a surgical intervention.  The patient's history has been reviewed, patient examined, no change in status, stable for surgery.  I have reviewed the patient's chart and labs.  Questions were answered to the patient's satisfaction.     Armentha Branagan A Jyoti Harju
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
## 2021-07-03 ENCOUNTER — Encounter (HOSPITAL_COMMUNITY): Payer: Self-pay | Admitting: Otolaryngology

## 2021-07-03 DIAGNOSIS — E43 Unspecified severe protein-calorie malnutrition: Secondary | ICD-10-CM | POA: Diagnosis present

## 2021-07-03 LAB — CBC WITH DIFFERENTIAL/PLATELET
Abs Immature Granulocytes: 0.14 10*3/uL — ABNORMAL HIGH (ref 0.00–0.07)
Basophils Absolute: 0 10*3/uL (ref 0.0–0.1)
Basophils Relative: 0 %
Eosinophils Absolute: 0 10*3/uL (ref 0.0–0.5)
Eosinophils Relative: 0 %
HCT: 36.3 % — ABNORMAL LOW (ref 39.0–52.0)
Hemoglobin: 11.4 g/dL — ABNORMAL LOW (ref 13.0–17.0)
Immature Granulocytes: 1 %
Lymphocytes Relative: 3 %
Lymphs Abs: 0.5 10*3/uL — ABNORMAL LOW (ref 0.7–4.0)
MCH: 30.8 pg (ref 26.0–34.0)
MCHC: 31.4 g/dL (ref 30.0–36.0)
MCV: 98.1 fL (ref 80.0–100.0)
Monocytes Absolute: 1.2 10*3/uL — ABNORMAL HIGH (ref 0.1–1.0)
Monocytes Relative: 7 %
Neutro Abs: 15.8 10*3/uL — ABNORMAL HIGH (ref 1.7–7.7)
Neutrophils Relative %: 89 %
Platelets: 171 10*3/uL (ref 150–400)
RBC: 3.7 MIL/uL — ABNORMAL LOW (ref 4.22–5.81)
RDW: 13.8 % (ref 11.5–15.5)
WBC: 17.7 10*3/uL — ABNORMAL HIGH (ref 4.0–10.5)
nRBC: 0 % (ref 0.0–0.2)

## 2021-07-03 LAB — MAGNESIUM: Magnesium: 2.6 mg/dL — ABNORMAL HIGH (ref 1.7–2.4)

## 2021-07-03 LAB — BASIC METABOLIC PANEL
Anion gap: 7 (ref 5–15)
BUN: 28 mg/dL — ABNORMAL HIGH (ref 8–23)
CO2: 31 mmol/L (ref 22–32)
Calcium: 9.2 mg/dL (ref 8.9–10.3)
Chloride: 103 mmol/L (ref 98–111)
Creatinine, Ser: 1.18 mg/dL (ref 0.61–1.24)
GFR, Estimated: 59 mL/min — ABNORMAL LOW (ref >60)
Glucose, Bld: 167 mg/dL — ABNORMAL HIGH (ref 70–99)
Potassium: 4.1 mmol/L (ref 3.5–5.1)
Sodium: 141 mmol/L (ref 135–145)

## 2021-07-03 LAB — GLUCOSE, CAPILLARY
Glucose-Capillary: 106 mg/dL — ABNORMAL HIGH (ref 70–99)
Glucose-Capillary: 108 mg/dL — ABNORMAL HIGH (ref 70–99)
Glucose-Capillary: 128 mg/dL — ABNORMAL HIGH (ref 70–99)
Glucose-Capillary: 136 mg/dL — ABNORMAL HIGH (ref 70–99)
Glucose-Capillary: 154 mg/dL — ABNORMAL HIGH (ref 70–99)
Glucose-Capillary: 167 mg/dL — ABNORMAL HIGH (ref 70–99)
Glucose-Capillary: 94 mg/dL (ref 70–99)

## 2021-07-03 MED ORDER — LORAZEPAM 2 MG/ML IJ SOLN
0.5000 mg | Freq: Three times a day (TID) | INTRAMUSCULAR | Status: DC | PRN
  Administered 2021-07-03: 0.5 mg via INTRAVENOUS
  Filled 2021-07-03: qty 1

## 2021-07-03 MED ORDER — HYDRALAZINE HCL 20 MG/ML IJ SOLN
10.0000 mg | Freq: Four times a day (QID) | INTRAMUSCULAR | Status: DC | PRN
  Administered 2021-07-03 – 2021-07-15 (×8): 10 mg via INTRAVENOUS
  Filled 2021-07-03 (×8): qty 1

## 2021-07-03 MED ORDER — CHLORHEXIDINE GLUCONATE CLOTH 2 % EX PADS
6.0000 | MEDICATED_PAD | Freq: Every day | CUTANEOUS | Status: DC
  Administered 2021-07-03 – 2021-08-12 (×40): 6 via TOPICAL

## 2021-07-03 MED ORDER — CHLORHEXIDINE GLUCONATE CLOTH 2 % EX PADS: 6.0000 | MEDICATED_PAD | Freq: Every day | CUTANEOUS | Status: DC

## 2021-07-03 MED ORDER — ACETYLCYSTEINE 20 % IN SOLN
2.0000 mL | Freq: Three times a day (TID) | RESPIRATORY_TRACT | Status: DC
  Administered 2021-07-03 – 2021-07-06 (×8): 2 mL via RESPIRATORY_TRACT
  Filled 2021-07-03 (×10): qty 4

## 2021-07-03 NOTE — Evaluation (Signed)
Occupational Therapy Evaluation Patient Details Name: Christopher Santiago MRN: [id number] DOB: 08/31/1931 Today's Date: 07/03/2021    History of Present Illness Pt is an 87 y/o male admitted secondary to worsening SOB, especially with exertion. Found to have acute respiratory failure with hypoxia. Imaging showed fullness of the right aryepiglottic fold extending into the right pyriform and L renal mass. Pt had Flexible laryngoscopy on 8/10. Workup pending. PMH includes vocal cord cancer, COPD, CAD, DM, HTN, and R THA.   Clinical Impression   PTA patient was living with his spouse in a private residence and was grossly Mod I with ADLs/IADLs and use of rollator. Patient currently functioning below baseline demonstrating observed ADLs with Min to Mod A grossly. Patient also limited by deficits listed below including decreased cognition (likely secondary to early morning eval), decreased activity tolerance, decreased cardiopulmonary status and decreased balance and would benefit from continued acute OT services in prep for safe d/c to next level of care.      Follow Up Recommendations  Home health OT;Supervision/Assistance - 24 hour    Equipment Recommendations  Other (comment) (TBD)    Recommendations for Other Services       Precautions / Restrictions Precautions Precautions: Fall Precaution Comments: Watch O2. Restrictions Weight Bearing Restrictions: No      Mobility Bed Mobility Overal bed mobility: Needs Assistance Bed Mobility: Supine to Sit;Sit to Supine     Supine to sit: Min assist     General bed mobility comments: Min A to advance BLE toward EOB, patient able to elevate trunk without external assist. Requires increased time.    Transfers Overall transfer level: Needs assistance Equipment used: 1 person hand held assist Transfers: Sit to/from Stand Sit to Stand: Min guard         General transfer comment: Min guard for sit to stand from EOB with HHA +1.     Balance Overall balance assessment: Needs assistance Sitting-balance support: No upper extremity supported;Feet supported Sitting balance-Leahy Scale: Fair     Standing balance support: Single extremity supported Standing balance-Leahy Scale: Poor Standing balance comment: Reliant on UE and external support                           ADL either performed or assessed with clinical judgement   ADL Overall ADL's : Needs assistance/impaired                 Upper Body Dressing : Minimal assistance Upper Body Dressing Details (indicate cue type and reason): Donned anterior hosptial gown seated EOB.     Toilet Transfer: Minimal Print production planner Details (indicate cue type and reason): Simualted with transfer to recliner with HHA +1.         Functional mobility during ADLs: Minimal assistance       Vision Baseline Vision/History: Wears glasses Wears Glasses: Reading only Patient Visual Report: No change from baseline Vision Assessment?: No apparent visual deficits     Perception     Praxis      Pertinent Vitals/Pain Pain Assessment: No/denies pain     Hand Dominance     Extremity/Trunk Assessment Upper Extremity Assessment Upper Extremity Assessment: Generalized weakness   Lower Extremity Assessment Lower Extremity Assessment: Generalized weakness   Cervical / Trunk Assessment Cervical / Trunk Assessment: Kyphotic   Communication Communication Communication: Tracheostomy;HOH (R hearing aide)   Cognition Arousal/Alertness: Awake/alert Behavior During Therapy: WFL for tasks assessed/performed Overall Cognitive Status: No family/caregiver present to  determine baseline cognitive functioning                                 General Comments: Patient oriented to first name only this date. Disoriented to place, time and situation. Unable to recall name of current president. States "I don't have a wife" when asked if his wife is  able to bathe/dress herself. Follows 1-step verbal commands with good accuracy. Disorientation may be secondary to this therapist awaking patient for evaluation.   General Comments  VSS on 5L FiO2 28% via trach collar.    Exercises     Shoulder Instructions      Home Living Family/patient expects to be discharged to:: Private residence Living Arrangements: Spouse/significant other Available Help at Discharge: Family Type of Home: House Home Access: Stairs to enter Technical brewer of Steps: 3 Entrance Stairs-Rails: Right;Left Home Layout: One level               Home Equipment: Walker - 4 wheels   Additional Comments: Spouse is disabled and pt reports they have no one else to assist. Information obtained from PT eval on 8/10. Patient uanble to confirm homes set-up at time of OT eval 8/11.      Prior Functioning/Environment Level of Independence: Independent with assistive device(s)        Comments: Per met chart, patient is the caregiver for his wife. Uses rollator for ambulation. Reports he has to take frequent rest breaks.        OT Problem List: Decreased strength;Decreased activity tolerance;Impaired balance (sitting and/or standing);Decreased cognition;Decreased safety awareness;Decreased knowledge of use of DME or AE      OT Treatment/Interventions: Self-care/ADL training;Therapeutic exercise;Energy conservation;DME and/or AE instruction;Therapeutic activities;Cognitive remediation/compensation;Patient/family education;Balance training    OT Goals(Current goals can be found in the care plan section) Acute Rehab OT Goals Patient Stated Goal: No goals stated. OT Goal Formulation: Patient unable to participate in goal setting Time For Goal Achievement: 07/17/21 Potential to Achieve Goals: Good ADL Goals Pt Will Perform Eating: Independently Pt Will Perform Grooming: with modified independence;standing Pt Will Perform Upper Body Dressing: Independently Pt  Will Perform Lower Body Dressing: with modified independence;sit to/from stand Pt Will Transfer to Toilet: with modified independence;ambulating Pt Will Perform Toileting - Clothing Manipulation and hygiene: with modified independence;sit to/from stand Pt Will Perform Tub/Shower Transfer: ambulating;rolling walker;Tub transfer;Shower transfer Pt/caregiver will Perform Home Exercise Program: Increased ROM;Increased strength;Both right and left upper extremity  OT Frequency: Min 2X/week   Barriers to D/C:            Co-evaluation              AM-PAC OT "6 Clicks" Daily Activity     Outcome Measure Help from another person eating meals?: Total (NPO) Help from another person taking care of personal grooming?: A Little Help from another person toileting, which includes using toliet, bedpan, or urinal?: A Little Help from another person bathing (including washing, rinsing, drying)?: A Lot Help from another person to put on and taking off regular upper body clothing?: A Little Help from another person to put on and taking off regular lower body clothing?: A Lot 6 Click Score: 14   End of Session Equipment Utilized During Treatment: Gait belt;Oxygen (Via trach collar) Nurse Communication: Mobility status;Other (comment) (Poor orientation this a.m. May be secondary to OT waking patient for eval.)  Activity Tolerance: Patient tolerated treatment well Patient left: in chair;with  call bell/phone within reach  OT Visit Diagnosis: Unsteadiness on feet (R26.81);Muscle weakness (generalized) (M62.81)                Time: 0502-5615 OT Time Calculation (min): 33 min Charges:  OT General Charges $OT Visit: 1 Visit OT Evaluation $OT Eval Moderate Complexity: 1 Mod OT Treatments $Therapeutic Activity: 8-22 mins  Joesiah Lonon H. OTR/L Supplemental OT, Department of rehab services [phone]  Oluwatobi Ruppe R H. 07/03/2021, 8:22 AM

## 2021-07-03 NOTE — Progress Notes (Signed)
Physical Therapy Treatment Patient Details Name: Christopher Santiago MRN: [id number] DOB: 1931/05/13 Today's Date: 07/03/2021    History of Present Illness Pt is an 87 y/o male admitted secondary to worsening SOB, especially with exertion. Found to have acute respiratory failure with hypoxia. Imaging showed fullness of the right aryepiglottic fold extending into the right pyriform and L renal mass. Pt had Flexible laryngoscopy on 8/10. Workup pending. PMH includes vocal cord cancer, COPD, CAD, DM, HTN, and R THA.    PT Comments    Patient willing to attempt OOB despite recent coughing spells with lots of secretions. Moving well, just slowly as you can see he is trying not to cough. Unable to progress to gait at this time due to pt's fatigue.     Follow Up Recommendations  Supervision for mobility/OOB;Other (comment) (TBD pending progression)     Equipment Recommendations  Other (comment) (TBD pending progression)    Recommendations for Other Services       Precautions / Restrictions Precautions Precautions: Fall Precaution Comments: Watch O2.    Mobility  Bed Mobility Overal bed mobility: Needs Assistance Bed Mobility: Rolling;Sidelying to Sit Rolling: Min assist Sidelying to sit: Min assist       General bed mobility comments: vc and assist for rolling and side to sit    Transfers Overall transfer level: Needs assistance Equipment used: 1 person hand held assist Transfers: Sit to/from Stand Sit to Stand: Min assist         General transfer comment: min assist to steady upon standing  Ambulation/Gait                 Stairs             Wheelchair Mobility    Modified Rankin (Stroke Patients Only)       Balance Overall balance assessment: Needs assistance Sitting-balance support: No upper extremity supported;Feet supported Sitting balance-Leahy Scale: Fair     Standing balance support: Single extremity supported Standing balance-Leahy Scale:  Poor Standing balance comment: Reliant on UE and external support                            Cognition Arousal/Alertness: Awake/alert Behavior During Therapy: WFL for tasks assessed/performed Overall Cognitive Status: Difficult to assess                                        Exercises      General Comments General comments (skin integrity, edema, etc.): VSS on trach collar FiO2 28%. Pt had "major coughing spell" per RT and RN recently and therefore was fatigued and easily coughing. Therefore only did OOB to chair and did not progress to ambulaton      Pertinent Vitals/Pain      Home Living                      Prior Function            PT Goals (current goals can now be found in the care plan section) Acute Rehab PT Goals Patient Stated Goal: No goals stated. PT Goal Formulation: With patient Time For Goal Achievement: 07/16/21 Potential to Achieve Goals: Good Progress towards PT goals: Progressing toward goals    Frequency    Min 3X/week      PT Plan Other (comment) (continue to assess as able  to mobilize more)    Co-evaluation              AM-PAC PT "6 Clicks" Mobility   Outcome Measure  Help needed turning from your back to your side while in a flat bed without using bedrails?: None Help needed moving from lying on your back to sitting on the side of a flat bed without using bedrails?: A Little Help needed moving to and from a bed to a chair (including a wheelchair)?: A Little Help needed standing up from a chair using your arms (e.g., wheelchair or bedside chair)?: A Little Help needed to walk in hospital room?: A Little Help needed climbing 3-5 steps with a railing? : A Lot 6 Click Score: 18    End of Session Equipment Utilized During Treatment: Gait belt;Oxygen Activity Tolerance: Patient limited by fatigue Patient left: with call bell/phone within reach;in chair;with family/visitor present Nurse  Communication: Mobility status PT Visit Diagnosis: Unsteadiness on feet (R26.81);Difficulty in walking, not elsewhere classified (R26.2)     Time: 7583-0746 PT Time Calculation (min) (ACUTE ONLY): 24 min  Charges:  $Therapeutic Activity: 23-37 mins                      Christopher Santiago, PT Pager [phone]    Christopher Santiago 07/03/2021, 4:12 PM
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
## 2021-07-03 NOTE — Progress Notes (Signed)
PCCM Progress Note  Patient remains on ATC overnight with no further identified critical care needs at this time  PCCM will sign off. Thank you for the opportunity to participate in this patient's care. Please contact if we can be of further assistance.  Laydon Martis D. Kenton Kingfisher, NP-C [facility] Personal contact information can be found on Amion  07/03/2021, 9:23 AM

## 2021-07-03 NOTE — Progress Notes (Signed)
eLink Physician-Brief Progress Note Patient Name: SAMSON RALPH DOB: 1930/12/26 MRN: [id number]   Date of Service  07/03/2021  HPI/Events of Note  Nursing request for AM labs.   eICU Interventions  Will order CBC with platelets, BMP and Mg++ level at 5 AM.     Intervention Category Major Interventions: Other:  Lysle Dingwall 07/03/2021, 3:19 AM

## 2021-07-03 NOTE — Progress Notes (Signed)
PROGRESS NOTE    Christopher Santiago  TFT:[id number] DOB: 16-Aug-1931 DOA: 07/01/2021 PCP: Tonia Ghent, MD   Brief Narrative: 87 year old with past medical history significant for COPD, quit smoking 30 years ago, vocal cord cancer, diabetes who presents complaining of exertional dyspnea, reports sinusitis, postnasal drip, hoarseness for the past 3 to 4 months.  She is also complaining of difficulty swallowing, food gets stuck in the throat, he also report phlegm, in his throat.   CT maxillofacial, soft tissue neck: Asymmetric soft tissue density/fullness at the base of the right aryepiglottic fold, extending into the right piriform sinus with possible involvement of the right glottis inferiorly.  If echo to measure or define a discrete mass within this region given lack of IV contrast.  ENT consulted for direct visualization.  Patient underwent  laryngoscopy with biopsy and tracheostomy 8/10.    Assessment & Plan:   Principal Problem:   Acute respiratory failure with hypoxia (HCC) Active Problems:   Essential hypertension   COPD with acute exacerbation (HCC)   Left renal mass   Respiratory failure, acute (HCC)   Protein-calorie malnutrition, severe  1-Acute hypoxic respiratory failure: Suspect related to component of aspiration due to dysphagia.  COPD exacerbation Continue with ceftriaxone Continue with nebulizer and IV steroids CT: With asymmetric soft tissue density fullness at the base of the right aryepiglottic fold: ENT consulted.  Patient underwent laryngoscopy with biopsy and tracheostomy /8/11. Stable on ATC 5 L oxygen.  Patient with upper secretions. Nursing staff will contact ENT .  Speech swallow evaluation.   2-1.2 cm spiculated right upper lobe nodule: He will need follow-up for these  3-Left Renal mass: Concerning for renal carcinoma Will need to follow-up with urology  Diabetes: Not on medication. Check CBG Leukocytosis probably related to steroids.  Anxiety:  Will consider low dose benzo.   Estimated body mass index is 25.19 kg/m as calculated from the following:   Height as of this encounter: 5' 6"  (1.676 m).   Weight as of this encounter: 70.8 kg.   DVT prophylaxis: Hold Lovenox Code Status: Full Code Family Communication:  Disposition Plan:  Status is: Inpatient  Remains inpatient appropriate because:IV treatments appropriate due to intensity of illness or inability to take PO  Dispo: The patient is from: Home              Anticipated d/c is to: to be determine              Patient currently is not medically stable to d/c.   Difficult to place patient No        Consultants:  ENT CCM for post Sx management   Procedures:  Laryngoscopy   Antimicrobials:    Subjective: He is alert, report secretion upper airway.   Objective: Vitals:   07/03/21 1400 07/03/21 1429 07/03/21 1430 07/03/21 1500  BP: (!) 149/69 (!) 149/69  140/67  Pulse: 81   86  Resp: (!) 21   18  Temp:      TempSrc:      SpO2: 97%  98% 100%  Weight:      Height:        Intake/Output Summary (Last 24 hours) at 07/03/2021 1516 Last data filed at 07/03/2021 1500 Gross per 24 hour  Intake 2640.23 ml  Output 405 ml  Net 2235.23 ml   Filed Weights   07/02/21 0437 07/03/21 0600  Weight: 77.1 kg 70.8 kg    Examination:  General exam: NAD, trach in place.  Respiratory system: No wheezing, CTA Cardiovascular system: S 1, S 2 RRR Gastrointestinal system: BS present, soft , nt Central nervous system: alert Extremities: Symmetric power.  Skin: no rashes.    Data Reviewed: I have personally reviewed following labs and imaging studies  CBC: Recent Labs  Lab 07/01/21 1729 07/02/21 0403 07/03/21 0507  WBC 7.1 6.4 17.7*  NEUTROABS 4.9  --  15.8*  HGB 11.4* 11.4* 11.4*  HCT 37.5* 36.4* 36.3*  MCV 98.9 97.6 98.1  PLT 197 178 419    Basic Metabolic Panel: Recent Labs  Lab 07/01/21 1729 07/02/21 0403 07/03/21 0507  NA 140 139 141  K  3.9 3.8 4.1  CL 102 99 103  CO2 29 29 31   GLUCOSE 87 150* 167*  BUN 26* 25* 28*  CREATININE 1.19 1.21 1.18  CALCIUM 9.3 9.3 9.2  MG  --   --  2.6*    GFR: Estimated Creatinine Clearance: 39 mL/min (by C-G formula based on SCr of 1.18 mg/dL). Liver Function Tests: Recent Labs  Lab 07/01/21 1729  AST 18  ALT 10  ALKPHOS 39  BILITOT 0.9  PROT 6.6  ALBUMIN 3.2*    No results for input(s): LIPASE, AMYLASE in the last 168 hours. No results for input(s): AMMONIA in the last 168 hours. Coagulation Profile: No results for input(s): INR, PROTIME in the last 168 hours. Cardiac Enzymes: No results for input(s): CKTOTAL, CKMB, CKMBINDEX, TROPONINI in the last 168 hours. BNP (last 3 results) Recent Labs    11/08/20 1106  PROBNP 482.0*    HbA1C: No results for input(s): HGBA1C in the last 72 hours. CBG: Recent Labs  Lab 07/02/21 2002 07/03/21 0001 07/03/21 0336 07/03/21 0740 07/03/21 1126  GLUCAP 167* 154* 167* 136* 128*   Lipid Profile: No results for input(s): CHOL, HDL, LDLCALC, TRIG, CHOLHDL, LDLDIRECT in the last 72 hours. Thyroid Function Tests: No results for input(s): TSH, T4TOTAL, FREET4, T3FREE, THYROIDAB in the last 72 hours. Anemia Panel: No results for input(s): VITAMINB12, FOLATE, FERRITIN, TIBC, IRON, RETICCTPCT in the last 72 hours. Sepsis Labs: No results for input(s): PROCALCITON, LATICACIDVEN in the last 168 hours.  Recent Results (from the past 240 hour(s))  Resp Panel by RT-PCR (Flu A&B, Covid) Nasopharyngeal Swab     Status: None   Collection Time: 07/01/21  5:39 PM   Specimen: Nasopharyngeal Swab; Nasopharyngeal(NP) swabs in vial transport medium  Result Value Ref Range Status   SARS Coronavirus 2 by RT PCR NEGATIVE NEGATIVE Final    Comment: (NOTE) SARS-CoV-2 target nucleic acids are NOT DETECTED.  The SARS-CoV-2 RNA is generally detectable in upper respiratory specimens during the acute phase of infection. The lowest concentration of  SARS-CoV-2 viral copies this assay can detect is 138 copies/mL. A negative result does not preclude SARS-Cov-2 infection and should not be used as the sole basis for treatment or other patient management decisions. A negative result may occur with  improper specimen collection/handling, submission of specimen other than nasopharyngeal swab, presence of viral mutation(s) within the areas targeted by this assay, and inadequate number of viral copies(<138 copies/mL). A negative result must be combined with clinical observations, patient history, and epidemiological information. The expected result is Negative.  Fact Sheet for Patients:  EntrepreneurPulse.com.au  Fact Sheet for Healthcare Providers:  IncredibleEmployment.be  This test is no t yet approved or cleared by the [place] FDA and  has been authorized for detection and/or diagnosis of SARS-CoV-2 by FDA under an Emergency Use Authorization (EUA). This EUA  will remain  in effect (meaning this test can be used) for the duration of the COVID-19 declaration under Section 564(b)(1) of the Act, 21 U.S.C.section 360bbb-3(b)(1), unless the authorization is terminated  or revoked sooner.       Influenza A by PCR NEGATIVE NEGATIVE Final   Influenza B by PCR NEGATIVE NEGATIVE Final    Comment: (NOTE) The Xpert Xpress SARS-CoV-2/FLU/RSV plus assay is intended as an aid in the diagnosis of influenza from Nasopharyngeal swab specimens and should not be used as a sole basis for treatment. Nasal washings and aspirates are unacceptable for Xpert Xpress SARS-CoV-2/FLU/RSV testing.  Fact Sheet for Patients: EntrepreneurPulse.com.au  Fact Sheet for Healthcare Providers: IncredibleEmployment.be  This test is not yet approved or cleared by the [place] FDA and has been authorized for detection and/or diagnosis of SARS-CoV-2 by FDA under an Emergency Use  Authorization (EUA). This EUA will remain in effect (meaning this test can be used) for the duration of the COVID-19 declaration under Section 564(b)(1) of the Act, 21 U.S.C. section 360bbb-3(b)(1), unless the authorization is terminated or revoked.  Performed at [facility], [place] [address], [place], [place] [address]   MRSA Next Gen by PCR, Nasal     Status: None   Collection Time: 07/02/21  6:26 PM   Specimen: Nasal Mucosa; Nasal Swab  Result Value Ref Range Status   MRSA by PCR Next Gen NOT DETECTED NOT DETECTED Final    Comment: (NOTE) The GeneXpert MRSA Assay (FDA approved for NASAL specimens only), is one component of a comprehensive MRSA colonization surveillance program. It is not intended to diagnose MRSA infection nor to guide or monitor treatment for MRSA infections. Test performance is not FDA approved in patients less than 80 years old. Performed at [facility], [place] [address], [place], [place] [address]           Radiology Studies: CT SOFT TISSUE NECK WO CONTRAST  Result Date: 07/02/2021 CLINICAL DATA:  Initial evaluation for chronic sinusitis, history of vocal cord cancer. EXAM: CT MAXILLOFACIAL WITHOUT CONTRAST CT NECK WITHOUT CONTRAST TECHNIQUE: Multidetector CT imaging of the neck was performed following the standard protocol without intravenous contrast. COMPARISON:  None available. FINDINGS: Pharynx and larynx: Visualized oral cavity within normal limits. No visible acute abnormality about the dentition. Oropharynx and nasopharynx within normal limits. No retropharyngeal collection or swelling. Epiglottis normal. Vallecula clear. There is asymmetric soft tissue density/fullness at the base of the right aryepiglottic fold extending into the right piriformis sinus (series 3, image 61). Difficult to measure or define a discrete mass within this region given lack of IV contrast. Possible involvement of the right false cord and laryngeal ventricle.  Asymmetric sclerosis with medialization of the right arytenoid cartilage and right true cord, which also may be involved (series 3, image 66). Asymmetric infiltration within the right paraglottic fat (series 3, image 62) no definite erosive changes of the adjacent thyroid cartilage. Contralateral left aryepiglottic fold within normal limits. Left piriform sinus clear. Subglottic airway patent clear. Salivary glands: Apparent 1.1 cm soft tissue density lesion at the superior aspect of the right parotid gland noted (series 3, image 13). Finding is closely approximated to adjacent vascular structures, and could reflect a portion of a non-opacified vein, not well assessed on this noncontrast examination. There is an additional 1.2 cm lesion with central calcification along the posterior margin of the right parotid gland inferiorly (series 3, image 30), indeterminate. Left parotid gland unremarkable. Submandibular glands within normal  limits. Thyroid: Normal. Lymph nodes: No enlarged or pathologic adenopathy seen on this noncontrast examination. Vascular: Moderate atheromatous change about the visualized aortic arch and proximal great vessels, carotid bifurcations, and skull base. Limited intracranial: Age-related cerebral atrophy. Otherwise unremarkable. Visualized orbits: Prior bilateral ocular lens replacement. Globes and orbital soft tissues demonstrate no acute finding. Mastoids and visualized paranasal sinuses: Chronic mucosal thickening noted about the left frontal sinus, with scattered mucoperiosteal thickening about the ethmoidal air cells bilaterally. Paranasal sinuses are otherwise clear. No air-fluid levels to suggest acute sinusitis. Mastoid air cells and middle ear cavities are well pneumatized and free of fluid. Skeleton: No visible acute osseous finding. No discrete or worrisome osseous lesions. Mild chronic compression deformity noted involving the T4 vertebral body. Moderate spondylosis noted at C6-7.  Upper chest: Emphysematous changes noted within the visualized lungs. 1.2 cm spiculated nodular density with associated architectural distortion present at the right upper lobe, indeterminate. Few additional pleural base nodular densities measuring 7 mm (series 4, image 39) and 9 mm (series 4, image 26) seen at the posterior 0 medial right upper lobe. Visualized upper chest demonstrates no other acute finding. Other: None. IMPRESSION: 1. Asymmetric soft tissue density/fullness at the base of the right aryepiglottic fold extending into the right piriformis sinus, with possible involvement of the right glottis inferiorly as above. Difficult to measure or define a discrete mass within this region given lack of IV contrast. While this finding could reflect sequelae of prior treatment, possible locally recurrent tumor could also have this appearance. Correlation with direct visualization recommended. No adenopathy. 2. 1.2 cm spiculated right upper lobe nodule, with a few additional pleural based nodules at the posterior right upper lobe as above. Findings are indeterminate. Consider one of the following in 3 months for both low-risk and high-risk individuals: (a) repeat chest CT, (b) follow-up PET-CT, or (c) tissue sampling. This recommendation follows the consensus statement: Guidelines for Management of Incidental Pulmonary Nodules Detected on CT Images: From the Fleischner Society 2017; Radiology 2017; 284:228-243. 3. Mild chronic frontoethmoidal sinusitis. No CT evidence for active disease at this time. 4. Two small approximate 1 cm soft tissue density lesions involving the right parotid gland as above, indeterminate. Nonemergent outpatient ENT referral for further workup and evaluation suggested. 5. Aortic Atherosclerosis (ICD10-I70.0) and Emphysema (ICD10-J43.9). Electronically Signed   By: Jeannine Boga M.D.   On: 07/02/2021 03:11   CT Angio Chest PE W and/or Wo Contrast  Result Date: 07/01/2021 CLINICAL  DATA:  Shortness of breath.  Nausea and vomiting. High probability for pulmonary embolus. EXAM: CT ANGIOGRAPHY CHEST WITH CONTRAST TECHNIQUE: Multidetector CT imaging of the chest was performed using the standard protocol during bolus administration of intravenous contrast. Multiplanar CT image reconstructions and MIPs were obtained to evaluate the vascular anatomy. Performed concurrently with abdominopelvic CT, reported separately. CONTRAST:  152m OMNIPAQUE IOHEXOL 350 MG/ML SOLN COMPARISON:  Chest radiograph earlier today.  Chest CT 05/18/2018 FINDINGS: Cardiovascular: There are no filling defects within the pulmonary arteries to suggest pulmonary embolus. Please note that apparent filling defects in the right upper lobe represent venous mixing and is within pulmonary veins, not pulmonary arteries. The heart is normal in size. Borderline aneurysmal dilatation of the ascending aorta at 4 cm. There is diffuse aortic atherosclerosis. Cannot assess for aortic dissection given phase of contrast tailored to pulmonary artery evaluation. There are coronary artery calcifications. Small pericardial effusion anteriorly measures 10 mm in depth. Mediastinum/Nodes: Calcified mediastinal and hilar lymph nodes consistent with prior granulomatous disease. There  is no noncalcified adenopathy. No suspicious thyroid nodule. No esophageal wall thickening. Lungs/Pleura: Moderate emphysema. Multiple calcified nodules throughout both lungs consistent with prior granulomatous disease. There is central bronchial thickening with areas of bronchial filling in the left lower lobe. No acute airspace disease. No pleural fluid or findings of pulmonary edema. Scarring and cystic changes in the inferior left lower lobe appears similar to prior exam. Additional areas of scarring in the subpleural right lower lobe and scattered throughout the right upper lobe. There is a stable spiculated nodular density in the right upper lobe measuring 8 x 5 mm,  series 6, image 36, stable from 2019 and likely related to scarring. Perifissural nodule on the left, series 6, image 92, measuring 4 mm, unchanged. Upper Abdomen: Assessed on concurrent abdominal CT, reported separately. Musculoskeletal: Moderate T12 compression fracture, present on 03/01/2020 abdominal CT with progressive loss of height of the superior endplate. There is a mild T8 superior endplate compression fracture that is new. Mild T4 superior endplate compression fracture is also new. No focal bone lesion. Review of the MIP images confirms the above findings. IMPRESSION: 1. No pulmonary embolus. 2. Advanced emphysema. Lower lobe bronchial thickening with areas of mucous plugging/bronchial filling in the left lower lobe. 3. Areas of nodular scarring within both lungs, upper lobe predominant, similar to 2019. No suspicious pulmonary nodule. 4. Sequela of prior granulomatous disease with multiple calcified pulmonary nodules and calcified mediastinal and hilar lymph nodes. 5. Compression fractures of T4 and T8 vertebral bodies are new from 2019. Compression fracture of T12, most present on 03/01/2020 abdominal CT, but increased loss of height since that time. Aortic Atherosclerosis (ICD10-I70.0) and Emphysema (ICD10-J43.9). Electronically Signed   By: Keith Rake M.D.   On: 07/01/2021 21:23   CT ABDOMEN PELVIS W CONTRAST  Result Date: 07/01/2021 CLINICAL DATA:  Nausea and vomiting. EXAM: CT ABDOMEN AND PELVIS WITH CONTRAST TECHNIQUE: Multidetector CT imaging of the abdomen and pelvis was performed using the standard protocol following bolus administration of intravenous contrast. CONTRAST:  149m OMNIPAQUE IOHEXOL 350 MG/ML SOLN COMPARISON:  CT 03/01/2020 FINDINGS: Lower chest: Assessed on concurrent chest CT, reported separately. Hepatobiliary: Small cysts in the liver. No suspicious liver lesion. Hepatic steatosis. Gallbladder physiologically distended, no calcified stone. No biliary dilatation.  Pancreas: No ductal dilatation or inflammation. Spleen: Stable 15 mm cyst in the periphery of the spleen. No splenomegaly. Adrenals/Urinary Tract: Stable 3.2 cm heterogeneous right adrenal nodule. Normal left adrenal gland. Mixed density lesion in the left mid kidney has enhancing components and has increased in size from prior exam. This currently measures 2.7 x 2.7 cm, series 5, image 25, previously 2 cm, suspicious for renal neoplasm. Additional simple cysts in both kidneys. No hydronephrosis. No visualized renal calculi. Unremarkable urinary bladder. Stomach/Bowel: The stomach is decompressed. No evidence of gastric wall thickening or perigastric inflammation. No Peri duodenal inflammation. No bowel obstruction or inflammation. Diffuse colonic stool with moderate stool burden and colonic tortuosity. Left colonic diverticulosis without diverticulitis. Air and stool distends the rectum, rectal distention of 6.8 cm. Vascular/Lymphatic: Infrarenal IVC filter in place. Advanced aortic and branch atherosclerosis. Infrarenal aortic aneurysm measuring 3.8 x 3.4 cm, previously 3.4 x 3.3 cm. There is eccentric mural thrombus posteriorly. No periaortic stranding or inflammation. The portal vein is patent. No abdominopelvic adenopathy. No retroperitoneal adenopathy. Reproductive: Unremarkable prostate gland. Other: No ascites or free air. No abdominopelvic collection. No abdominal wall hernia. Musculoskeletal: Right hip arthroplasty. T12 compression fracture was seen on prior exam but with progressive  loss of height from prior. The bones are diffusely under mineralized. No focal bone lesion. IMPRESSION: 1. No acute abnormality in the abdomen/pelvis. 2. Mixed density lesion in the left mid kidney has increased in size from prior exam, currently measuring 2.7 x 2.7 cm, previously 2 cm, highly suspicious for renal neoplasm. 3. Moderate stool burden and colonic tortuosity, suggesting constipation. Air and stool distends the  rectum, rectal distention of 6.8 cm. 4. Infrarenal aortic aneurysm measuring 3.8 x 3.4 cm, previously 3.4 x 3.3 cm. Recommend follow-up every 2 years. This recommendation follows ACR consensus guidelines: White Paper of the ACR Incidental Findings Committee II on Vascular Findings. J Am Coll Radiol 2013; 74:163-845. 5. Stable 3.2 cm right adrenal nodule. 6. T12 compression fracture was seen on prior exam but with progressive loss of height in the interim. 7. Advanced aortic and branch atherosclerosis. 8. Additional chronic incidental findings are stable as described. Aortic Atherosclerosis (ICD10-I70.0). Electronically Signed   By: Keith Rake M.D.   On: 07/01/2021 21:29   DG Chest Portable 1 View  Result Date: 07/01/2021 CLINICAL DATA:  Shortness of breath EXAM: PORTABLE CHEST 1 VIEW COMPARISON:  08/29/2019 FINDINGS: Calcified right upper lung nodule. Linear scarring or atelectasis left base. No consolidation or effusion. Normal cardiomediastinal silhouette with aortic atherosclerosis. IMPRESSION: No active disease. Streaky scarring or atelectasis at the left base. Electronically Signed   By: Donavan Foil M.D.   On: 07/01/2021 18:15   CT MAXILLOFACIAL WO CONTRAST  Result Date: 07/02/2021 CLINICAL DATA:  Initial evaluation for chronic sinusitis, history of vocal cord cancer. EXAM: CT MAXILLOFACIAL WITHOUT CONTRAST CT NECK WITHOUT CONTRAST TECHNIQUE: Multidetector CT imaging of the neck was performed following the standard protocol without intravenous contrast. COMPARISON:  None available. FINDINGS: Pharynx and larynx: Visualized oral cavity within normal limits. No visible acute abnormality about the dentition. Oropharynx and nasopharynx within normal limits. No retropharyngeal collection or swelling. Epiglottis normal. Vallecula clear. There is asymmetric soft tissue density/fullness at the base of the right aryepiglottic fold extending into the right piriformis sinus (series 3, image 61). Difficult to  measure or define a discrete mass within this region given lack of IV contrast. Possible involvement of the right false cord and laryngeal ventricle. Asymmetric sclerosis with medialization of the right arytenoid cartilage and right true cord, which also may be involved (series 3, image 66). Asymmetric infiltration within the right paraglottic fat (series 3, image 62) no definite erosive changes of the adjacent thyroid cartilage. Contralateral left aryepiglottic fold within normal limits. Left piriform sinus clear. Subglottic airway patent clear. Salivary glands: Apparent 1.1 cm soft tissue density lesion at the superior aspect of the right parotid gland noted (series 3, image 13). Finding is closely approximated to adjacent vascular structures, and could reflect a portion of a non-opacified vein, not well assessed on this noncontrast examination. There is an additional 1.2 cm lesion with central calcification along the posterior margin of the right parotid gland inferiorly (series 3, image 30), indeterminate. Left parotid gland unremarkable. Submandibular glands within normal limits. Thyroid: Normal. Lymph nodes: No enlarged or pathologic adenopathy seen on this noncontrast examination. Vascular: Moderate atheromatous change about the visualized aortic arch and proximal great vessels, carotid bifurcations, and skull base. Limited intracranial: Age-related cerebral atrophy. Otherwise unremarkable. Visualized orbits: Prior bilateral ocular lens replacement. Globes and orbital soft tissues demonstrate no acute finding. Mastoids and visualized paranasal sinuses: Chronic mucosal thickening noted about the left frontal sinus, with scattered mucoperiosteal thickening about the ethmoidal air cells bilaterally. Paranasal  sinuses are otherwise clear. No air-fluid levels to suggest acute sinusitis. Mastoid air cells and middle ear cavities are well pneumatized and free of fluid. Skeleton: No visible acute osseous finding. No  discrete or worrisome osseous lesions. Mild chronic compression deformity noted involving the T4 vertebral body. Moderate spondylosis noted at C6-7. Upper chest: Emphysematous changes noted within the visualized lungs. 1.2 cm spiculated nodular density with associated architectural distortion present at the right upper lobe, indeterminate. Few additional pleural base nodular densities measuring 7 mm (series 4, image 39) and 9 mm (series 4, image 26) seen at the posterior 0 medial right upper lobe. Visualized upper chest demonstrates no other acute finding. Other: None. IMPRESSION: 1. Asymmetric soft tissue density/fullness at the base of the right aryepiglottic fold extending into the right piriformis sinus, with possible involvement of the right glottis inferiorly as above. Difficult to measure or define a discrete mass within this region given lack of IV contrast. While this finding could reflect sequelae of prior treatment, possible locally recurrent tumor could also have this appearance. Correlation with direct visualization recommended. No adenopathy. 2. 1.2 cm spiculated right upper lobe nodule, with a few additional pleural based nodules at the posterior right upper lobe as above. Findings are indeterminate. Consider one of the following in 3 months for both low-risk and high-risk individuals: (a) repeat chest CT, (b) follow-up PET-CT, or (c) tissue sampling. This recommendation follows the consensus statement: Guidelines for Management of Incidental Pulmonary Nodules Detected on CT Images: From the Fleischner Society 2017; Radiology 2017; 284:228-243. 3. Mild chronic frontoethmoidal sinusitis. No CT evidence for active disease at this time. 4. Two small approximate 1 cm soft tissue density lesions involving the right parotid gland as above, indeterminate. Nonemergent outpatient ENT referral for further workup and evaluation suggested. 5. Aortic Atherosclerosis (ICD10-I70.0) and Emphysema (ICD10-J43.9).  Electronically Signed   By: Jeannine Boga M.D.   On: 07/02/2021 03:11        Scheduled Meds:  chlorhexidine gluconate (MEDLINE KIT)  15 mL Mouth Rinse BID   Chlorhexidine Gluconate Cloth  6 each Topical Daily   guaiFENesin  10 mL Oral Q8H   ipratropium-albuterol  3 mL Nebulization Q6H   mouth rinse  15 mL Mouth Rinse 10 times per day   methylPREDNISolone (SOLU-MEDROL) injection  40 mg Intravenous Q12H   nystatin  5 mL Oral QID   Continuous Infusions:  sodium chloride 50 mL/hr at 07/03/21 1500   cefTRIAXone (ROCEPHIN)  IV Stopped (07/02/21 2154)     LOS: 2 days    Time spent: 35 minutes.     Elmarie Shiley, MD [facility]   If 7PM-7AM, please contact night-coverage [email or web address]  07/03/2021, 3:16 PM

## 2021-07-03 NOTE — Progress Notes (Signed)
Initial Nutrition Assessment  DOCUMENTATION CODES:   Severe malnutrition in context of chronic illness  INTERVENTION:   Given severe malnutrition, recommend Cortrak NG tube placement on 8/12 and initiation of enteral nutrition. Recommend: - Start Osmolite 1.5 @ 20 ml/hr and advance by 10 ml q 8 hours to goal rate of 50 ml/hr (1200 ml/day) - ProSource TF 45 ml BID - Free water flushes of 150 ml q 4 hours  Recommended tube feeding regimen at goal would provide 1880 kcal, 97 grams of protein, and 914 ml of H2O.  Total free water with recommended flushes: 1814 ml  If tube feeds initiated, monitor magnesium, potassium, and phosphorus daily for at least 3 days, MD to replete as needed, as pt is at risk for refeeding syndrome given severe malnutrition.  NUTRITION DIAGNOSIS:   Severe Malnutrition related to chronic illness (COPD) as evidenced by severe fat depletion, severe muscle depletion.  GOAL:   Patient will meet greater than or equal to 90% of their needs  MONITOR:   Diet advancement, Labs, Weight trends, Skin, I & O's  REASON FOR ASSESSMENT:   Consult Assessment of nutrition requirement/status  ASSESSMENT:   87 year old male who presented to the ED on 8/09 with SOB and dysphagia. PMH of COPD, CAD, depression, DM, HTN, HLD, laryngeal cancer treated ~30 years ago, OSA.  8/10 - s/p laryngeal biopsy and tracheostomy  Discussed pt with RN and during ICU rounds. Pt currently on trach collar. SLP to see pt today.  Met with pt at bedside. Pt sleeping at time of RD visit but did wake up briefly. Pt endorses difficulty swallowing PTA but unable give a timeframe. Pt does not think that he has lost weight but does not know for sure.  Reviewed weight history in chart. Pt with a 7.7 kg weight loss since 11/08/20. This is a 9.8% weight loss in 8 months which is not quite significant for timeframe but is concerning given subcutaneous fat and muscle depletions. Pt meets criteria for  severe malnutrition.  Given severe malnutrition, recommend Cortrak NG tube placement on 8/12 and initiation of enteral nutrition. Even if pt able to have diet advanced after MBS, it is unlikely that he will be able to take in enough via PO route to meet his estimated needs. RD has communicated recommendations to MD via secure chat.  Medications reviewed and include: IV solu-medrol, nystatin mouthwash, IV abx IVF: NS @ 50 ml/hr  Labs reviewed: BUN 28, magnesium 2.6 CBG's: 136-180 x 24 hours  UOP: 400 ml x 12 hours I/O's: +1.9 L since admit  NUTRITION - FOCUSED PHYSICAL EXAM:  Flowsheet Row Most Recent Value  Orbital Region Severe depletion  Upper Arm Region Moderate depletion  Thoracic and Lumbar Region Severe depletion  Buccal Region Moderate depletion  Temple Region Severe depletion  Clavicle Bone Region Severe depletion  Clavicle and Acromion Bone Region Severe depletion  Scapular Bone Region Unable to assess  Dorsal Hand Moderate depletion  Patellar Region Moderate depletion  Anterior Thigh Region Moderate depletion  Posterior Calf Region Moderate depletion  Edema (RD Assessment) None  Hair Reviewed  Eyes Reviewed  Mouth Reviewed  Skin Reviewed  Nails Reviewed       Diet Order:   Diet Order             Diet NPO time specified Except for: Ice Chips  Diet effective now                   EDUCATION NEEDS:  No education needs have been identified at this time  Skin:  Skin Assessment: Skin Integrity Issues: Incisions: neck s/p trach  Last BM:  no documented BM  Height:   Ht Readings from Last 1 Encounters:  07/02/21 5' 6"  (1.676 m)    Weight:   Wt Readings from Last 1 Encounters:  07/03/21 70.8 kg    BMI:  Body mass index is 25.19 kg/m.  Estimated Nutritional Needs:   Kcal:  8375-4237  Protein:  85-100 grams  Fluid:  1.8-2.0 L    Gustavus Bryant, MS, RD, LDN Inpatient Clinical Dietitian Please see AMiON for contact information.

## 2021-07-04 ENCOUNTER — Ambulatory Visit: Payer: Medicare Other | Admitting: Family Medicine

## 2021-07-04 ENCOUNTER — Inpatient Hospital Stay (HOSPITAL_COMMUNITY): Payer: Medicare Other

## 2021-07-04 LAB — MAGNESIUM: Magnesium: 2.5 mg/dL — ABNORMAL HIGH (ref 1.7–2.4)

## 2021-07-04 LAB — BASIC METABOLIC PANEL
Anion gap: 9 (ref 5–15)
BUN: 22 mg/dL (ref 8–23)
CO2: 27 mmol/L (ref 22–32)
Calcium: 9.1 mg/dL (ref 8.9–10.3)
Chloride: 105 mmol/L (ref 98–111)
Creatinine, Ser: 0.92 mg/dL (ref 0.61–1.24)
GFR, Estimated: >60 mL/min (ref >60)
Glucose, Bld: 119 mg/dL — ABNORMAL HIGH (ref 70–99)
Potassium: 4 mmol/L (ref 3.5–5.1)
Sodium: 141 mmol/L (ref 135–145)

## 2021-07-04 LAB — CBC
HCT: 38.2 % — ABNORMAL LOW (ref 39.0–52.0)
Hemoglobin: 12 g/dL — ABNORMAL LOW (ref 13.0–17.0)
MCH: 31 pg (ref 26.0–34.0)
MCHC: 31.4 g/dL (ref 30.0–36.0)
MCV: 98.7 fL (ref 80.0–100.0)
Platelets: 153 10*3/uL (ref 150–400)
RBC: 3.87 MIL/uL — ABNORMAL LOW (ref 4.22–5.81)
RDW: 14.2 % (ref 11.5–15.5)
WBC: 15 10*3/uL — ABNORMAL HIGH (ref 4.0–10.5)
nRBC: 0 % (ref 0.0–0.2)

## 2021-07-04 LAB — GLUCOSE, CAPILLARY
Glucose-Capillary: 101 mg/dL — ABNORMAL HIGH (ref 70–99)
Glucose-Capillary: 132 mg/dL — ABNORMAL HIGH (ref 70–99)
Glucose-Capillary: 195 mg/dL — ABNORMAL HIGH (ref 70–99)
Glucose-Capillary: 70 mg/dL (ref 70–99)
Glucose-Capillary: 83 mg/dL (ref 70–99)
Glucose-Capillary: 94 mg/dL (ref 70–99)

## 2021-07-04 LAB — PHOSPHORUS: Phosphorus: 3.4 mg/dL (ref 2.5–4.6)

## 2021-07-04 LAB — SURGICAL PATHOLOGY

## 2021-07-04 MED ORDER — LORAZEPAM 2 MG/ML IJ SOLN
0.2500 mg | Freq: Three times a day (TID) | INTRAMUSCULAR | Status: DC | PRN
  Administered 2021-07-04 – 2021-07-06 (×4): 0.25 mg via INTRAVENOUS
  Filled 2021-07-04 (×4): qty 1

## 2021-07-04 MED ORDER — LIDOCAINE HCL URETHRAL/MUCOSAL 2 % EX GEL
1.0000 "application " | Freq: Once | CUTANEOUS | Status: DC
  Filled 2021-07-04 (×2): qty 6

## 2021-07-04 MED ORDER — OSMOLITE 1.5 CAL PO LIQD
1000.0000 mL | ORAL | Status: DC
  Administered 2021-07-04 – 2021-07-14 (×10): 1000 mL
  Filled 2021-07-04 (×17): qty 1000

## 2021-07-04 MED ORDER — PROSOURCE TF PO LIQD
45.0000 mL | Freq: Two times a day (BID) | ORAL | Status: DC
  Administered 2021-07-04 – 2021-08-08 (×64): 45 mL
  Filled 2021-07-04 (×65): qty 45

## 2021-07-04 MED ORDER — DEXTROSE 5 % IV SOLN: INTRAVENOUS | Status: DC

## 2021-07-04 NOTE — Procedures (Signed)
Cortrak  Tube Type:  Cortrak - 43 inches Tube Location:  Right nare Initial Placement:  Stomach Secured by: Bridle Technique Used to Measure Tube Placement:  Marking at nare/corner of mouth Cortrak Secured At:  78 cm  Cortrak Tube Team Note:  Consult received to place a Cortrak feeding tube.   X-ray is required, abdominal x-ray has been ordered by the Cortrak team. Please confirm tube placement before using the Cortrak tube.   If the tube becomes dislodged please keep the tube and contact the Cortrak team at [email or web address] (password [email or web address]) for replacement.  If after hours and replacement cannot be delayed, place a NG tube and confirm placement with an abdominal x-ray.    Koleen Distance MS, RD, LDN Please refer to [facility] for RD and/or RD on-call/weekend/after hours pager

## 2021-07-04 NOTE — Progress Notes (Signed)
PROGRESS NOTE    Christopher Santiago  KTG:[id number] DOB: 1930/12/09 DOA: 07/01/2021 PCP: Tonia Ghent, MD   Brief Narrative: 87 year old with past medical history significant for COPD, quit smoking 30 years ago, vocal cord cancer, diabetes who presents complaining of exertional dyspnea, reports sinusitis, postnasal drip, hoarseness for the past 3 to 4 months.  She is also complaining of difficulty swallowing, food gets stuck in the throat, he also report phlegm, in his throat.   CT maxillofacial, soft tissue neck: Asymmetric soft tissue density/fullness at the base of the right aryepiglottic fold, extending into the right piriform sinus with possible involvement of the right glottis inferiorly.  If echo to measure or define a discrete mass within this region given lack of IV contrast.  ENT consulted for direct visualization.  Patient underwent  laryngoscopy with biopsy and tracheostomy 8/10.    Assessment & Plan:   Principal Problem:   Acute respiratory failure with hypoxia (HCC) Active Problems:   Essential hypertension   COPD with acute exacerbation (HCC)   Left renal mass   Respiratory failure, acute (HCC)   Protein-calorie malnutrition, severe  1-Acute hypoxic respiratory failure: Suspect related to component of aspiration due to dysphagia.  COPD exacerbation Continue with ceftriaxone Continue with nebulizer and IV steroids for 5 days.  CT: With asymmetric soft tissue density fullness at the base of the right aryepiglottic fold: ENT consulted.  Patient underwent laryngoscopy with biopsy and tracheostomy /8/11. Stable on ATC 5 L oxygen.  Per ENT; ok to deflate cuff, keep obturator taped to head of bed, keep spare Shiley trach at bedside. Speech once cuff is deflated. Do not cut trach suture until POD 5, plan to exchange cuffless trach on POD 5 if stable.  Underwent Modified swallow today and fail. Plan for PMV trial. Speech will follow. Will proceed with  coretrack placement,  patient poor nutrition and CBG decreasing, and swallow wont be able to be repeated until Monday. I will discuss with Dr Fredric Dine. Awaiting call back.  Started on Mucomyst TID. He report improvement of secretions.    2-1.2 cm spiculated right upper lobe nodule: He will need follow-up for these  3-Left Renal mass: Concerning for renal carcinoma Will need to follow-up with urology  Diabetes: Not on medication. Check CBG Leukocytosis probably related to steroids.  Anxiety: low dose benzo.   Estimated body mass index is 24.52 kg/m as calculated from the following:   Height as of this encounter: 5' 6"  (1.676 m).   Weight as of this encounter: 68.9 kg.   DVT prophylaxis: Hold Lovenox Code Status: Full Code Family Communication: Daughter 8/11 Disposition Plan:  Status is: Inpatient  Remains inpatient appropriate because:IV treatments appropriate due to intensity of illness or inability to take PO  Dispo: The patient is from: Home              Anticipated d/c is to: to be determine              Patient currently is not medically stable to d/c.   Difficult to place patient No        Consultants:  ENT CCM for post Sx management   Procedures:  Laryngoscopy   Antimicrobials:    Subjective: He report less upper resp secretion.    Objective: Vitals:   07/04/21 0600 07/04/21 0629 07/04/21 0700 07/04/21 0730  BP: (!) 168/82 (!) 174/86  (!) 120/43  Pulse: (!) 41     Resp: 15     Temp:  97.6 F (36.4 C)   TempSrc:   Oral   SpO2: 98%     Weight:  68.9 kg    Height:        Intake/Output Summary (Last 24 hours) at 07/04/2021 0833 Last data filed at 07/04/2021 2376 Gross per 24 hour  Intake 1166.1 ml  Output 1585 ml  Net -418.9 ml    Filed Weights   07/02/21 0437 07/03/21 0600 07/04/21 0629  Weight: 77.1 kg 70.8 kg 68.9 kg    Examination:  General exam: NAD, Trach in place.  Respiratory system: CTA Cardiovascular system: S 1, S 2 RRR Gastrointestinal  system: BS present, soft, nt Central nervous system: Alert Extremities: Symmetric power Skin: no rashes.    Data Reviewed: I have personally reviewed following labs and imaging studies  CBC: Recent Labs  Lab 07/01/21 1729 07/02/21 0403 07/03/21 0507 07/04/21 0336  WBC 7.1 6.4 17.7* 15.0*  NEUTROABS 4.9  --  15.8*  --   HGB 11.4* 11.4* 11.4* 12.0*  HCT 37.5* 36.4* 36.3* 38.2*  MCV 98.9 97.6 98.1 98.7  PLT 197 178 171 283    Basic Metabolic Panel: Recent Labs  Lab 07/01/21 1729 07/02/21 0403 07/03/21 0507 07/04/21 0336  NA 140 139 141 141  K 3.9 3.8 4.1 4.0  CL 102 99 103 105  CO2 29 29 31 27   GLUCOSE 87 150* 167* 119*  BUN 26* 25* 28* 22  CREATININE 1.19 1.21 1.18 0.92  CALCIUM 9.3 9.3 9.2 9.1  MG  --   --  2.6*  --     GFR: Estimated Creatinine Clearance: 50.1 mL/min (by C-G formula based on SCr of 0.92 mg/dL). Liver Function Tests: Recent Labs  Lab 07/01/21 1729  AST 18  ALT 10  ALKPHOS 39  BILITOT 0.9  PROT 6.6  ALBUMIN 3.2*    No results for input(s): LIPASE, AMYLASE in the last 168 hours. No results for input(s): AMMONIA in the last 168 hours. Coagulation Profile: No results for input(s): INR, PROTIME in the last 168 hours. Cardiac Enzymes: No results for input(s): CKTOTAL, CKMB, CKMBINDEX, TROPONINI in the last 168 hours. BNP (last 3 results) Recent Labs    11/08/20 1106  PROBNP 482.0*    HbA1C: No results for input(s): HGBA1C in the last 72 hours. CBG: Recent Labs  Lab 07/03/21 1538 07/03/21 1945 07/03/21 2320 07/04/21 0339 07/04/21 0807  GLUCAP 108* 94 106* 101* 83    Lipid Profile: No results for input(s): CHOL, HDL, LDLCALC, TRIG, CHOLHDL, LDLDIRECT in the last 72 hours. Thyroid Function Tests: No results for input(s): TSH, T4TOTAL, FREET4, T3FREE, THYROIDAB in the last 72 hours. Anemia Panel: No results for input(s): VITAMINB12, FOLATE, FERRITIN, TIBC, IRON, RETICCTPCT in the last 72 hours. Sepsis Labs: No results for  input(s): PROCALCITON, LATICACIDVEN in the last 168 hours.  Recent Results (from the past 240 hour(s))  Resp Panel by RT-PCR (Flu A&B, Covid) Nasopharyngeal Swab     Status: None   Collection Time: 07/01/21  5:39 PM   Specimen: Nasopharyngeal Swab; Nasopharyngeal(NP) swabs in vial transport medium  Result Value Ref Range Status   SARS Coronavirus 2 by RT PCR NEGATIVE NEGATIVE Final    Comment: (NOTE) SARS-CoV-2 target nucleic acids are NOT DETECTED.  The SARS-CoV-2 RNA is generally detectable in upper respiratory specimens during the acute phase of infection. The lowest concentration of SARS-CoV-2 viral copies this assay can detect is 138 copies/mL. A negative result does not preclude SARS-Cov-2 infection and should not be used  as the sole basis for treatment or other patient management decisions. A negative result may occur with  improper specimen collection/handling, submission of specimen other than nasopharyngeal swab, presence of viral mutation(s) within the areas targeted by this assay, and inadequate number of viral copies(<138 copies/mL). A negative result must be combined with clinical observations, patient history, and epidemiological information. The expected result is Negative.  Fact Sheet for Patients:  EntrepreneurPulse.com.au  Fact Sheet for Healthcare Providers:  IncredibleEmployment.be  This test is no t yet approved or cleared by the [place] FDA and  has been authorized for detection and/or diagnosis of SARS-CoV-2 by FDA under an Emergency Use Authorization (EUA). This EUA will remain  in effect (meaning this test can be used) for the duration of the COVID-19 declaration under Section 564(b)(1) of the Act, 21 U.S.C.section 360bbb-3(b)(1), unless the authorization is terminated  or revoked sooner.       Influenza A by PCR NEGATIVE NEGATIVE Final   Influenza B by PCR NEGATIVE NEGATIVE Final    Comment: (NOTE) The  Xpert Xpress SARS-CoV-2/FLU/RSV plus assay is intended as an aid in the diagnosis of influenza from Nasopharyngeal swab specimens and should not be used as a sole basis for treatment. Nasal washings and aspirates are unacceptable for Xpert Xpress SARS-CoV-2/FLU/RSV testing.  Fact Sheet for Patients: EntrepreneurPulse.com.au  Fact Sheet for Healthcare Providers: IncredibleEmployment.be  This test is not yet approved or cleared by the [place] FDA and has been authorized for detection and/or diagnosis of SARS-CoV-2 by FDA under an Emergency Use Authorization (EUA). This EUA will remain in effect (meaning this test can be used) for the duration of the COVID-19 declaration under Section 564(b)(1) of the Act, 21 U.S.C. section 360bbb-3(b)(1), unless the authorization is terminated or revoked.  Performed at [facility], [place] [address], [place], [place] [address]   MRSA Next Gen by PCR, Nasal     Status: None   Collection Time: 07/02/21  6:26 PM   Specimen: Nasal Mucosa; Nasal Swab  Result Value Ref Range Status   MRSA by PCR Next Gen NOT DETECTED NOT DETECTED Final    Comment: (NOTE) The GeneXpert MRSA Assay (FDA approved for NASAL specimens only), is one component of a comprehensive MRSA colonization surveillance program. It is not intended to diagnose MRSA infection nor to guide or monitor treatment for MRSA infections. Test performance is not FDA approved in patients less than 49 years old. Performed at [facility], [place] [address], [place], [place] [address]           Radiology Studies: No results found.      Scheduled Meds:  acetylcysteine  2 mL Nebulization TID   chlorhexidine gluconate (MEDLINE KIT)  15 mL Mouth Rinse BID   Chlorhexidine Gluconate Cloth  6 each Topical Daily   guaiFENesin  10 mL Oral Q8H   ipratropium-albuterol  3 mL Nebulization Q6H   mouth rinse  15 mL Mouth Rinse 10 times per day    methylPREDNISolone (SOLU-MEDROL) injection  40 mg Intravenous Q12H   nystatin  5 mL Oral QID   Continuous Infusions:  sodium chloride 50 mL/hr at 07/04/21 0600   cefTRIAXone (ROCEPHIN)  IV Stopped (07/03/21 2051)     LOS: 3 days    Time spent: 35 minutes.     Elmarie Shiley, MD [facility]   If 7PM-7AM, please contact night-coverage [email or web address]  07/04/2021, 8:33 AM

## 2021-07-04 NOTE — Progress Notes (Signed)
Attempted to update spouse x1.

## 2021-07-04 NOTE — Progress Notes (Addendum)
Nutrition Follow Up  DOCUMENTATION CODES:   Severe malnutrition in context of chronic illness  INTERVENTION:   Tube feeding:  - Osmolite 1.5 @ 20 ml/hr via Cortrak - Advance by 10 ml q 8 hours to goal rate of 55 ml/hr (1320 ml/day) - ProSource TF 45 ml BID - Free water flushes of 150 ml q 4 hours  Recommended tube feeding regimen at goal would provide 2060 kcal, 105 grams of protein, and 1006 ml of H2O (1906 ml free water total with flushes)  Once tube feeds initiated, monitor magnesium, potassium, and phosphorus daily for at least 3 days, MD to replete as needed, as pt is at risk for refeeding syndrome given severe malnutrition.  NUTRITION DIAGNOSIS:   Severe Malnutrition related to chronic illness (COPD) as evidenced by severe fat depletion, severe muscle depletion.  Ongoing  GOAL:   Patient will meet greater than or equal to 90% of their needs  Addressed via TF  MONITOR:   Diet advancement, Labs, Weight trends, Skin, I & O's  REASON FOR ASSESSMENT:   Consult Assessment of nutrition requirement/status  ASSESSMENT:   87 year old male who presented to the ED on 8/09 with SOB and dysphagia. PMH of COPD, CAD, depression, DM, HTN, HLD, laryngeal cancer treated ~30 years ago, OSA.  8/10 - s/p laryngeal biopsy and tracheostomy  Remains on ATC. MBS this am was limited due to large volume aspiration on first nectar-thick liquid bolus. It was recommended to keep patient NPO. Cortrak to be placed at bedside for temporary nutrition. Plan to titrate tube feeding to goal given risk of refeeding.   Recommend keeping Cortrak in despite diet advancement as it is unlikely that he will be able to take in enough via PO route to meet his estimated needs. Once diet advanced and patient is taking more PO, may attempt transition to nocturnal feedings.   Noted hypoglycemic episodes, suspect improvement with initiation of TF. No BM x 2 days, recommend scheduled regimen to help facilitate  results.   Admission weight: 77.1 kg  Current weight: 68.9 kg   UOP: 1585 ml x 24 hrs   Drips: D5 @ 75 ml/hr  Medications: solumedrol Labs: CBG 70-106  Diet Order:   Diet Order             Diet NPO time specified Except for: Ice Chips  Diet effective now                   EDUCATION NEEDS:   No education needs have been identified at this time  Skin:  Skin Assessment: Skin Integrity Issues: Incisions: neck s/p trach  Last BM:  no documented BM  Height:   Ht Readings from Last 1 Encounters:  07/02/21 5\' 6"  (1.676 m)    Weight:   Wt Readings from Last 1 Encounters:  07/04/21 68.9 kg    BMI:  Body mass index is 24.52 kg/m.  Estimated Nutritional Needs:   Kcal:  2000-2200 kcal  Protein:  95-115 grams  Fluid:  >/= 2 L/day  Mariana Single MS, RD, LDN, CNSC Clinical Nutrition Pager listed in [place]

## 2021-07-04 NOTE — Progress Notes (Signed)
Modified Barium Swallow Progress Note  Patient Details  Name: Christopher Santiago MRN: [id number] Date of Birth: [birth year]  Today's Date: 07/04/2021  Modified Barium Swallow completed.  Full report located under Chart Review in the Imaging Section.  Brief recommendations include the following:  Clinical Impression  Pt participated in a very limited MBS due to large volume aspiration on the first nectar-thick liquid bolus.  There was poor mobility of the hyo-laryngeal mechanism, leading to immediate penetration of barium into the larynx with subsequent aspiration.  An attempted swallow led to marginal amount of barium passing through the UES and into the esophagus. The rest of the residue was aspirated. (Aspiration circled in red.) Under typical circumstances, more than one bolus is tested in order to offer the patient opportunities to have more success - however, Mr. Fread appeared anxious and uncomfortable, coughing became excessive so the study was discontinued.  Pt's cuff was inflated during the study, and the PMV was not utilized, so both of these circumstances may have influenced the negative outcome of the test.    For now, recommend continuing NPO; allow occasional ice chips after oral care; encourage pt to complete his own oral care using a toothbrush as often as possible. D/W RNs who were present for exam.  SLP will follow for plan and outcome of biopsy.         Swallow Evaluation Recommendations       SLP Diet Recommendations: Ice chips PRN after oral care;NPO;Alternative means - temporary     Oral Care Recommendations: Oral care QID     Devra Stare L. Tivis Ringer, [place] Office number [phone] Pager [phone]    Juan Quam Laurice 07/04/2021,10:19 AM

## 2021-07-05 LAB — PHOSPHORUS
Phosphorus: 2.7 mg/dL (ref 2.5–4.6)
Phosphorus: 2.4 mg/dL — ABNORMAL LOW (ref 2.5–4.6)

## 2021-07-05 LAB — BASIC METABOLIC PANEL
Anion gap: 8 (ref 5–15)
BUN: 35 mg/dL — ABNORMAL HIGH (ref 8–23)
CO2: 26 mmol/L (ref 22–32)
Calcium: 9.1 mg/dL (ref 8.9–10.3)
Chloride: 107 mmol/L (ref 98–111)
Creatinine, Ser: 1.1 mg/dL (ref 0.61–1.24)
GFR, Estimated: >60 mL/min (ref >60)
Glucose, Bld: 220 mg/dL — ABNORMAL HIGH (ref 70–99)
Potassium: 4.1 mmol/L (ref 3.5–5.1)
Sodium: 141 mmol/L (ref 135–145)

## 2021-07-05 LAB — CBC
HCT: 37.9 % — ABNORMAL LOW (ref 39.0–52.0)
Hemoglobin: 11.7 g/dL — ABNORMAL LOW (ref 13.0–17.0)
MCH: 30.4 pg (ref 26.0–34.0)
MCHC: 30.9 g/dL (ref 30.0–36.0)
MCV: 98.4 fL (ref 80.0–100.0)
Platelets: 167 10*3/uL (ref 150–400)
RBC: 3.85 MIL/uL — ABNORMAL LOW (ref 4.22–5.81)
RDW: 14.5 % (ref 11.5–15.5)
WBC: 12.6 10*3/uL — ABNORMAL HIGH (ref 4.0–10.5)
nRBC: 0 % (ref 0.0–0.2)

## 2021-07-05 LAB — MAGNESIUM
Magnesium: 2.7 mg/dL — ABNORMAL HIGH (ref 1.7–2.4)
Magnesium: 2.6 mg/dL — ABNORMAL HIGH (ref 1.7–2.4)

## 2021-07-05 LAB — GLUCOSE, CAPILLARY
Glucose-Capillary: 288 mg/dL — ABNORMAL HIGH (ref 70–99)
Glucose-Capillary: 217 mg/dL — ABNORMAL HIGH (ref 70–99)
Glucose-Capillary: 198 mg/dL — ABNORMAL HIGH (ref 70–99)
Glucose-Capillary: 161 mg/dL — ABNORMAL HIGH (ref 70–99)
Glucose-Capillary: 198 mg/dL — ABNORMAL HIGH (ref 70–99)

## 2021-07-05 LAB — HEMOGLOBIN A1C
Hgb A1c MFr Bld: 5.8 % — ABNORMAL HIGH (ref 4.8–5.6)
Mean Plasma Glucose: 119.76 mg/dL

## 2021-07-05 MED ORDER — SENNOSIDES 8.8 MG/5ML PO SYRP
5.0000 mL | ORAL_SOLUTION | Freq: Two times a day (BID) | ORAL | Status: DC
  Administered 2021-07-05 – 2021-08-05 (×54): 5 mL via ORAL
  Filled 2021-07-05 (×63): qty 5

## 2021-07-05 MED ORDER — INSULIN ASPART 100 UNIT/ML IJ SOLN
0.0000 [IU] | Freq: Three times a day (TID) | INTRAMUSCULAR | Status: DC
  Administered 2021-07-05 – 2021-07-06 (×4): 2 [IU] via SUBCUTANEOUS
  Administered 2021-07-06 – 2021-07-07 (×2): 1 [IU] via SUBCUTANEOUS
  Administered 2021-07-07 – 2021-07-08 (×3): 2 [IU] via SUBCUTANEOUS
  Administered 2021-07-08: 1 [IU] via SUBCUTANEOUS
  Administered 2021-07-09: 3 [IU] via SUBCUTANEOUS
  Administered 2021-07-10 – 2021-07-11 (×2): 1 [IU] via SUBCUTANEOUS
  Administered 2021-07-11: 2 [IU] via SUBCUTANEOUS
  Administered 2021-07-12 – 2021-07-19 (×6): 1 [IU] via SUBCUTANEOUS
  Administered 2021-07-20: 2 [IU] via SUBCUTANEOUS
  Administered 2021-07-20: 1 [IU] via SUBCUTANEOUS
  Administered 2021-07-21: 2 [IU] via SUBCUTANEOUS
  Administered 2021-07-21 – 2021-07-22 (×2): 1 [IU] via SUBCUTANEOUS

## 2021-07-05 MED ORDER — INSULIN ASPART 100 UNIT/ML IJ SOLN: 0.0000 [IU] | Freq: Every day | INTRAMUSCULAR | Status: DC

## 2021-07-05 MED ORDER — METOPROLOL TARTRATE 12.5 MG HALF TABLET
12.5000 mg | ORAL_TABLET | Freq: Every day | ORAL | Status: DC
  Administered 2021-07-05 – 2021-07-11 (×7): 12.5 mg via ORAL
  Filled 2021-07-05 (×7): qty 1

## 2021-07-05 MED ORDER — BISACODYL 10 MG RE SUPP: 10.0000 mg | Freq: Every day | RECTAL | Status: DC | PRN
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
## 2021-07-05 NOTE — Progress Notes (Signed)
S/w daughter Derry Lory provided updates.

## 2021-07-05 NOTE — Progress Notes (Signed)
Occupational Therapy Treatment Patient Details Name: Christopher Santiago MRN: [id number] DOB: August 08, 1931 Today's Date: 07/05/2021    History of present illness Pt is an 87 y/o male admitted secondary to worsening SOB, especially with exertion. Found to have acute respiratory failure with hypoxia. Imaging showed fullness of the right aryepiglottic fold extending into the right pyriform and L renal mass. Pt had Flexible laryngoscopy on 8/10. Workup pending. PMH includes vocal cord cancer, COPD, CAD, DM, HTN, and R THA.   OT comments  Pt. Seen for skilled OT treatment session.  Initially asleep but easily arousable for participation.  Pt. Able to perform BUE exercises as part of his HEP for cont. Strengthening.  Good return demo.  Nodding and mouthing appropriately to questions.    Follow Up Recommendations  Home health OT;Supervision/Assistance - 24 hour    Equipment Recommendations  Other (comment)    Recommendations for Other Services      Precautions / Restrictions Precautions Precautions: Fall Precaution Comments: Watch O2. Restrictions Weight Bearing Restrictions: No       Mobility Bed Mobility                    Transfers                      Balance                                           ADL either performed or assessed with clinical judgement   ADL                                               Vision       Perception     Praxis      Cognition Arousal/Alertness: Awake/alert Behavior During Therapy: WFL for tasks assessed/performed Overall Cognitive Status: Difficult to assess                                          Exercises General Exercises - Upper Extremity Shoulder Flexion: Both;5 reps;Supine;AROM Shoulder Extension: Both;5 reps;Supine;AROM Elbow Extension: AROM;Both;5 reps;Supine Wrist Flexion: AROM;Both;5 reps;Supine Wrist Extension: AROM;Both;5 reps;Supine Digit Composite  Flexion: AROM;Both;5 reps;Supine Composite Extension: AROM;Both;5 reps;Supine   Shoulder Instructions       General Comments      Pertinent Vitals/ Pain       Pain Assessment: No/denies pain  Home Living                                          Prior Functioning/Environment              Frequency  Min 2X/week        Progress Toward Goals  OT Goals(current goals can now be found in the care plan section)  Progress towards OT goals: Progressing toward goals     Plan Discharge plan remains appropriate    Co-evaluation                 AM-PAC OT "6 Clicks" Daily Activity  Outcome Measure   Help from another person eating meals?: Total Help from another person taking care of personal grooming?: A Little Help from another person toileting, which includes using toliet, bedpan, or urinal?: A Little Help from another person bathing (including washing, rinsing, drying)?: A Lot Help from another person to put on and taking off regular upper body clothing?: A Little Help from another person to put on and taking off regular lower body clothing?: A Lot 6 Click Score: 14    End of Session    OT Visit Diagnosis: Unsteadiness on feet (R26.81);Muscle weakness (generalized) (M62.81)   Activity Tolerance Patient tolerated treatment well   Patient Left in bed;with call bell/phone within reach;with restraints reapplied;with bed alarm set   Nurse Communication          Time: 1252-1300 OT Time Calculation (min): 8 min  Charges: OT General Charges $OT Visit: 1 Visit OT Treatments $Therapeutic Exercise: 8-22 mins  Sonia Baller, COTA/L Acute Rehabilitation [phone]    Tanya Nones 07/05/2021, 1:16 PM

## 2021-07-05 NOTE — Progress Notes (Signed)
Nutrition Follow-up  DOCUMENTATION CODES:   Severe malnutrition in context of chronic illness  INTERVENTION:   Continue advancing tube feeds using Osmolite 1.5 cal via Cortrak NGT by 10 ml q 8 hours to goal rate of 55 ml/hr (1320 ml/day)  Provide ProSource TF 45 ml BID  Provide Free water flushes of 150 ml q 4 hours   Recommended tube feeding regimen at goal would provide 2060 kcal, 105 grams of protein, and 1006 ml of H2O (1906 ml free water total with flushes)   Continue to monitor magnesium, potassium, and phosphorus daily for at least 3 days, MD to replete as needed, as pt is at risk for refeeding syndrome given severe malnutrition.  NUTRITION DIAGNOSIS:   Severe Malnutrition related to chronic illness (COPD) as evidenced by severe fat depletion, severe muscle depletion; ongoing  GOAL:   Patient will meet greater than or equal to 90% of their needs; progressing  MONITOR:   Diet advancement, Labs, Weight trends, Skin, I & O's  REASON FOR ASSESSMENT:   Consult Assessment of nutrition requirement/status  ASSESSMENT:   87 year old male who presented to the ED on 8/09 with SOB and dysphagia. PMH of COPD, CAD, depression, DM, HTN, HLD, laryngeal cancer treated ~30 years ago, OSA.  8/10 - s/p laryngeal biopsy and tracheostomy 8/12- Cortrak placed, tip of tube in stomach  Pt continues on trach collar and NPO status. Plans to continue advancing feeds by 10 ml every 8 hours to goal rate. Continue to monitor magnesium, potassium, and phosphorus daily for at least 3 days, MD to replete as needed, as pt is at risk for refeeding syndrome given severe malnutrition.  Labs and medications reviewed.  Potassium and phosphorous WNL.  Magnesium elevated at 2.7.  Diet Order:   Diet Order             Diet NPO time specified Except for: Ice Chips  Diet effective now                   EDUCATION NEEDS:   No education needs have been identified at this time  Skin:  Skin  Assessment: Reviewed RN Assessment Skin Integrity Issues:: Incisions Incisions: neck s/p trach  Last BM:  Unknown  Height:   Ht Readings from Last 1 Encounters:  07/02/21 5\' 6"  (1.676 m)    Weight:   Wt Readings from Last 1 Encounters:  07/05/21 70.8 kg   BMI:  Body mass index is 25.19 kg/m.  Estimated Nutritional Needs:   Kcal:  2000-2200 kcal  Protein:  95-115 grams  Fluid:  >/= 2 L/day  Corrin Parker, MS, RD, LDN RD pager number/after hours weekend pager number on Amion.

## 2021-07-06 ENCOUNTER — Inpatient Hospital Stay (HOSPITAL_COMMUNITY): Payer: Medicare Other

## 2021-07-06 DIAGNOSIS — Z930 Tracheostomy status: Secondary | ICD-10-CM | POA: Diagnosis not present

## 2021-07-06 DIAGNOSIS — J40 Bronchitis, not specified as acute or chronic: Secondary | ICD-10-CM

## 2021-07-06 LAB — CBC
HCT: 37 % — ABNORMAL LOW (ref 39.0–52.0)
Hemoglobin: 11.7 g/dL — ABNORMAL LOW (ref 13.0–17.0)
MCH: 30.5 pg (ref 26.0–34.0)
MCHC: 31.6 g/dL (ref 30.0–36.0)
MCV: 96.6 fL (ref 80.0–100.0)
Platelets: 147 10*3/uL — ABNORMAL LOW (ref 150–400)
RBC: 3.83 MIL/uL — ABNORMAL LOW (ref 4.22–5.81)
RDW: 14.3 % (ref 11.5–15.5)
WBC: 12.4 10*3/uL — ABNORMAL HIGH (ref 4.0–10.5)
nRBC: 0 % (ref 0.0–0.2)

## 2021-07-06 LAB — BASIC METABOLIC PANEL
Anion gap: 10 (ref 5–15)
BUN: 34 mg/dL — ABNORMAL HIGH (ref 8–23)
CO2: 23 mmol/L (ref 22–32)
Calcium: 9 mg/dL (ref 8.9–10.3)
Chloride: 106 mmol/L (ref 98–111)
Creatinine, Ser: 0.94 mg/dL (ref 0.61–1.24)
GFR, Estimated: >60 mL/min (ref >60)
Glucose, Bld: 226 mg/dL — ABNORMAL HIGH (ref 70–99)
Potassium: 4.8 mmol/L (ref 3.5–5.1)
Sodium: 139 mmol/L (ref 135–145)

## 2021-07-06 LAB — GLUCOSE, CAPILLARY
Glucose-Capillary: 184 mg/dL — ABNORMAL HIGH (ref 70–99)
Glucose-Capillary: 126 mg/dL — ABNORMAL HIGH (ref 70–99)
Glucose-Capillary: 157 mg/dL — ABNORMAL HIGH (ref 70–99)
Glucose-Capillary: 187 mg/dL — ABNORMAL HIGH (ref 70–99)
Glucose-Capillary: 144 mg/dL — ABNORMAL HIGH (ref 70–99)

## 2021-07-06 LAB — PHOSPHORUS: Phosphorus: 2.5 mg/dL (ref 2.5–4.6)

## 2021-07-06 MED ORDER — LORAZEPAM 2 MG/ML IJ SOLN
0.2500 mg | Freq: Four times a day (QID) | INTRAMUSCULAR | Status: DC | PRN
  Administered 2021-07-06 – 2021-07-09 (×8): 0.25 mg via INTRAVENOUS
  Filled 2021-07-06 (×8): qty 1

## 2021-07-06 MED ORDER — SODIUM CHLORIDE 3 % IN NEBU
4.0000 mL | INHALATION_SOLUTION | Freq: Two times a day (BID) | RESPIRATORY_TRACT | Status: AC
  Administered 2021-07-06 – 2021-07-08 (×6): 4 mL via RESPIRATORY_TRACT
  Filled 2021-07-06 (×6): qty 4

## 2021-07-06 MED ORDER — IPRATROPIUM-ALBUTEROL 0.5-2.5 (3) MG/3ML IN SOLN
3.0000 mL | Freq: Three times a day (TID) | RESPIRATORY_TRACT | Status: DC
  Administered 2021-07-06 (×3): 3 mL via RESPIRATORY_TRACT
  Filled 2021-07-06 (×3): qty 3

## 2021-07-06 MED ORDER — AMLODIPINE BESYLATE 5 MG PO TABS
5.0000 mg | ORAL_TABLET | Freq: Every day | ORAL | Status: DC
  Administered 2021-07-06 – 2021-08-12 (×30): 5 mg via ORAL
  Filled 2021-07-06 (×34): qty 1

## 2021-07-06 MED ORDER — LIP MEDEX EX OINT
TOPICAL_OINTMENT | CUTANEOUS | Status: DC | PRN
  Filled 2021-07-06: qty 7

## 2021-07-06 MED ORDER — FLUCONAZOLE 100MG IVPB: 100.0000 mg | INTRAVENOUS | Status: DC

## 2021-07-06 MED ORDER — NYSTATIN 100000 UNIT/ML MT SUSP
5.0000 mL | Freq: Four times a day (QID) | OROMUCOSAL | Status: DC
  Administered 2021-07-06 – 2021-08-12 (×138): 500000 [IU] via ORAL
  Filled 2021-07-06 (×127): qty 5

## 2021-07-06 NOTE — Progress Notes (Signed)
There is no IV medications scheduled until 2100 and prn will be available after 1940. Talked patient's RN regarding this matter and night shift nurse can put in the PIV consult. Patient's RN concerned about patient's condition. Patient's RN will put in the PIV consult, if patient's condition changes or needs it. HS [employer]

## 2021-07-06 NOTE — Progress Notes (Addendum)
NAME:  Christopher Santiago, MRN:  [id number], DOB:  1931/06/20, LOS: 5 ADMISSION DATE:  07/01/2021, CONSULTATION DATE:  07/06/2021  REFERRING MD:  Tyrell Antonio TRH, CHIEF COMPLAINT: Dyspnea and dysphagia  Brief History   Christopher Santiago is an 87 year old male with history of laryngeal cancer (~30 years ago) and COPD who presented to the ED on 07/01/21 for progressive dyspnea and dysphagia over the past few months and was found to have laryngeal narrowing due to mucosal abnormalities and edema.  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 We are reconsulted 8/14 for increased secretions and increased oxygen requirements on the floor   Past Medical History   DVT/PE status post IVC filter CAD Has an aortic aneurysm  Significant Hospital Events   8/9--admitted 8/10--laryngeal biopsy and tracheostomy  Consults:  PCCM ENT   Significant Diagnostic Tests:  8/9 CXR>>RUL nodule 8/9 CTA chest>>emphysema with lower lobe bronchial thickening and mucous plugging   Micro Data:    Antimicrobials:     Interim history/subjective:   Wife is at bedside, she reports that he always has postnasal drip and increased coughing for many years He has used Mucinex and Benadryl in the past He was an avid biker and had 50 mile bike rides, last at 87 years old  Objective   Blood pressure (!) 188/82, pulse 75, temperature 97.8 F (36.6 C), temperature source Axillary, resp. rate 16, height 5' 6"  (1.676 m), weight 70.8 kg, SpO2 97 %.    FiO2 (%):  [21 %-28 %] 21 %   Intake/Output Summary (Last 24 hours) at 07/06/2021 1403 Last data filed at 07/06/2021 1222 Gross per 24 hour  Intake 2670.77 ml  Output 1500 ml  Net 1170.77 ml   Filed Weights   07/03/21 0600 07/04/21 0629 07/05/21 0320  Weight: 70.8 kg 68.9 kg 70.8 kg    Examination: General: Elderly man, sitting up in bed, no distress HENT: Tracheostomy Shiley #6  mild pallor, no icterus, no JVD, no lymphadenopathy , copious thick white secretions Lungs: Barrel chest, no accessory muscle use, decreased breath sounds bilateral, no rhonchi Cardiovascular: S1-S2 distant, regular Abdomen: Soft, nontender Extremities: No edema, no deformity, bruising over both shins Neuro: Hard of hearing, alert and interactive, nonfocal   Chest x-ray 8/9 independently reviewed shows mild hyperinflation, otherwise clear  Resolved Hospital Problem list     Assessment & Plan:  Recurrence of laryngeal cancer -awaiting treatment plan from ENT/oncology  Tracheostomy status/increase secretions with increased suction requirements -Treat as acute tracheobronchitis, obtain respiratory culture, continue empiric ceftriaxone in the meantime -Hypertonic saline nebs to liquefy secretions -Continue Mucinex Tracheobronchial toilet  COPD -DuoNebs 3 times daily, add Pulmicort No PFTs available at present, he does not seem to be on maintenance inhalers  He has increased suction requirements every 1-2 hours and if this cannot be met on the floors then I would defer to primary service/nursing to transfer to the ICU   Best practice:  Diet: N.p.o.  DVT prophylaxis: SQheparin postop GI prophylaxis: N/A  Code Status: Full Family Communication: wife at bedside   Labs   CBC: Recent Labs  Lab 07/01/21 1729 07/02/21 0403 07/03/21 0507 07/04/21 0336 07/05/21 0538 07/06/21 0240  WBC 7.1 6.4 17.7* 15.0* 12.6* 12.4*  NEUTROABS 4.9  --  15.8*  --   --   --   HGB 11.4* 11.4* 11.4* 12.0* 11.7* 11.7*  HCT 37.5* 36.4* 36.3* 38.2* 37.9* 37.0*  MCV  98.9 97.6 98.1 98.7 98.4 96.6  PLT 197 178 171 153 167 147*     Basic Metabolic Panel: Recent Labs  Lab 07/02/21 0403 07/03/21 0507 07/04/21 0336 07/05/21 0538 07/05/21 1646 07/06/21 0240  NA 139 141 141 141  --  139  K 3.8 4.1 4.0 4.1  --  4.8  CL 99 103 105 107  --  106  CO2 29 31 27 26   --  23  GLUCOSE 150* 167* 119*  220*  --  226*  BUN 25* 28* 22 35*  --  34*  CREATININE 1.21 1.18 0.92 1.10  --  0.94  CALCIUM 9.3 9.2 9.1 9.1  --  9.0  MG  --  2.6* 2.5* 2.7* 2.6*  --   PHOS  --   --  3.4 2.7 2.4*  --     GFR: Estimated Creatinine Clearance: 49 mL/min (by C-G formula based on SCr of 0.94 mg/dL). Recent Labs  Lab 07/03/21 0507 07/04/21 0336 07/05/21 0538 07/06/21 0240  WBC 17.7* 15.0* 12.6* 12.4*     Liver Function Tests: Recent Labs  Lab 07/01/21 1729  AST 18  ALT 10  ALKPHOS 39  BILITOT 0.9  PROT 6.6  ALBUMIN 3.2*    No results for input(s): LIPASE, AMYLASE in the last 168 hours. No results for input(s): AMMONIA in the last 168 hours.  ABG    Component Value Date/Time   PHART 7.452 (H) 02/10/2016 0831   PCO2ART 38.7 02/10/2016 0831   PO2ART 66.0 (L) 02/10/2016 0831   HCO3 27.1 (H) 02/10/2016 0831   TCO2 28 02/10/2016 0831   O2SAT 94.0 02/10/2016 0831      Coagulation Profile: No results for input(s): INR, PROTIME in the last 168 hours.  Cardiac Enzymes: No results for input(s): CKTOTAL, CKMB, CKMBINDEX, TROPONINI in the last 168 hours.  HbA1C: Hemoglobin A1C  Date/Time Value Ref Range Status  11/05/2014 05:54 AM 6.3 4.2 - 6.3 % Final    Comment:    The American Diabetes Association recommends that a primary goal of therapy should be <7% and that physicians should reevaluate the treatment regimen in patients with HbA1c values consistently >8%.    Hgb A1c MFr Bld  Date/Time Value Ref Range Status  07/05/2021 05:38 AM 5.8 (H) 4.8 - 5.6 % Final    Comment:    (NOTE) Pre diabetes:          5.7%-6.4%  Diabetes:              >6.4%  Glycemic control for   <7.0% adults with diabetes   11/08/2018 02:51 PM 5.3 4.8 - 5.6 % Final    Comment:    (NOTE) Pre diabetes:          5.7%-6.4% Diabetes:              >6.4% Glycemic control for   <7.0% adults with diabetes     CBG: Recent Labs  Lab 07/05/21 1129 07/05/21 1535 07/05/21 2149 07/06/21 0917  07/06/21 1208  GLUCAP 198* 161* 198* 184* Kuna MD. FCCP. [facility] Pager : [phone]  If no response to pager , please call [phone] until 7 pm After 7:00 pm call Elink  [phone]   07/06/2021  2:03 PM

## 2021-07-06 NOTE — Progress Notes (Addendum)
PROGRESS NOTE    Christopher Santiago  PTW:[id number] DOB: 08/19/1931 DOA: 07/01/2021 PCP: Tonia Ghent, MD   Brief Narrative: 87 year old with past medical history significant for COPD, quit smoking 30 years ago, vocal cord cancer, diabetes who presents complaining of exertional dyspnea, reports sinusitis, postnasal drip, hoarseness for the past 3 to 4 months.  She is also complaining of difficulty swallowing, food gets stuck in the throat, he also report phlegm, in his throat.   CT maxillofacial, soft tissue neck: Asymmetric soft tissue density/fullness at the base of the right aryepiglottic fold, extending into the right piriform sinus with possible involvement of the right glottis inferiorly.  If echo to measure or define a discrete mass within this region given lack of IV contrast.  ENT consulted for direct visualization.  Patient underwent  laryngoscopy with biopsy and tracheostomy 8/10.    Assessment & Plan:   Principal Problem:   Acute respiratory failure with hypoxia (HCC) Active Problems:   Essential hypertension   COPD with acute exacerbation (HCC)   Left renal mass   Respiratory failure, acute (HCC)   Protein-calorie malnutrition, severe  1-Acute hypoxic respiratory failure: Suspect related to component of aspiration due to dysphagia.  COPD exacerbation Continue with ceftriaxone Continue with nebulizer and Completed 5 days of IV steroid. CT: With asymmetric soft tissue density fullness at the base of the right aryepiglottic fold: ENT consulted.  Patient underwent laryngoscopy with biopsy and tracheostomy /8/11. Stable on ATC 5 L oxygen.  Per ENT; ok to deflate cuff, keep obturator taped to head of bed, keep spare Shiley trach at bedside. Speech once cuff is deflated. Do not cut trach suture until POD 5, plan to exchange cuffless trach on POD 5 if stable.  Underwent Modified swallow today and fail. Plan for PMV trial. Continue speech trial PMSV Started on Mucomyst TID.   Continue to require frequent suctioning, unable to handle secretion, get flushes, plan to change ativan to Q 6 hours. CCM will follow up on patient for trach. Wife was at bedside. Updated.  Per nurse report patient is doing better with change frequency of ativan, he is managing secretion better. Plan to keep him in [facility] close monitoring.   2-Squamous cell carcinoma Larynx:  CT: With asymmetric soft tissue density fullness at the base of the right aryepiglottic fold: ENT consulted.  Patient underwent laryngoscopy with biopsy and tracheostomy /8/11. Stable on ATC 5 L oxygen.  Biopsy showed squamous cell carcinoma with foci suspicious for invasion.  ENT to follow on patient Monday.  Tumor board discussion on wednesday.   1.2 cm spiculated right upper lobe nodule: He will need follow-up for these  3-Left Renal mass: Concerning for renal carcinoma Will need to follow-up with urology  Diabetes: Not on medication. Check CBG  HTN;  Resume metoprolol Continue with PRN hydralazine.  Will add Norvasc.   Leukocytosis probably related to steroids.  Anxiety: low dose benzo.   Nutrition;  Continue with tube feeding.  Swallowing function tomorrow   Estimated body mass index is 25.19 kg/m as calculated from the following:   Height as of this encounter: 5' 6"  (1.676 m).   Weight as of this encounter: 70.8 kg.   DVT prophylaxis: Hold Lovenox Code Status: Full Code Family Communication: Daughter 8/13 Disposition Plan:  Status is: Inpatient  Remains inpatient appropriate because:IV treatments appropriate due to intensity of illness or inability to take PO  Dispo: The patient is from: Home  Anticipated d/c is to: to be determine              Patient currently is not medically stable to d/c.   Difficult to place patient No        Consultants:  ENT CCM for post Sx management   Procedures:  Laryngoscopy   Antimicrobials:    Subjective: He is alert, still  having oral secretion and from trach.   Objective: Vitals:   07/06/21 0300 07/06/21 0500 07/06/21 0759 07/06/21 1019  BP: (!) 159/59  (!) 159/59 (!) 188/82  Pulse: 83 76 91 73  Resp: 20 19 16 14   Temp: 97.8 F (36.6 C)     TempSrc: Axillary     SpO2: 100% 99% 100% 100%  Weight:      Height:        Intake/Output Summary (Last 24 hours) at 07/06/2021 1133 Last data filed at 07/05/2021 2358 Gross per 24 hour  Intake 2670.77 ml  Output 900 ml  Net 1770.77 ml    Filed Weights   07/03/21 0600 07/04/21 0629 07/05/21 0320  Weight: 70.8 kg 68.9 kg 70.8 kg    Examination:  General exam: NAD, Trach in place.  Respiratory system: CTA Cardiovascular system: S 1, S 2 RRR Gastrointestinal system: BS present, soft, nt Central nervous system: alert Extremities: no edema Skin: no rashes.    Data Reviewed: I have personally reviewed following labs and imaging studies  CBC: Recent Labs  Lab 07/01/21 1729 07/02/21 0403 07/03/21 0507 07/04/21 0336 07/05/21 0538 07/06/21 0240  WBC 7.1 6.4 17.7* 15.0* 12.6* 12.4*  NEUTROABS 4.9  --  15.8*  --   --   --   HGB 11.4* 11.4* 11.4* 12.0* 11.7* 11.7*  HCT 37.5* 36.4* 36.3* 38.2* 37.9* 37.0*  MCV 98.9 97.6 98.1 98.7 98.4 96.6  PLT 197 178 171 153 167 147*    Basic Metabolic Panel: Recent Labs  Lab 07/02/21 0403 07/03/21 0507 07/04/21 0336 07/05/21 0538 07/05/21 1646 07/06/21 0240  NA 139 141 141 141  --  139  K 3.8 4.1 4.0 4.1  --  4.8  CL 99 103 105 107  --  106  CO2 29 31 27 26   --  23  GLUCOSE 150* 167* 119* 220*  --  226*  BUN 25* 28* 22 35*  --  34*  CREATININE 1.21 1.18 0.92 1.10  --  0.94  CALCIUM 9.3 9.2 9.1 9.1  --  9.0  MG  --  2.6* 2.5* 2.7* 2.6*  --   PHOS  --   --  3.4 2.7 2.4*  --     GFR: Estimated Creatinine Clearance: 49 mL/min (by C-G formula based on SCr of 0.94 mg/dL). Liver Function Tests: Recent Labs  Lab 07/01/21 1729  AST 18  ALT 10  ALKPHOS 39  BILITOT 0.9  PROT 6.6  ALBUMIN 3.2*     No results for input(s): LIPASE, AMYLASE in the last 168 hours. No results for input(s): AMMONIA in the last 168 hours. Coagulation Profile: No results for input(s): INR, PROTIME in the last 168 hours. Cardiac Enzymes: No results for input(s): CKTOTAL, CKMB, CKMBINDEX, TROPONINI in the last 168 hours. BNP (last 3 results) Recent Labs    11/08/20 1106  PROBNP 482.0*    HbA1C: Recent Labs    07/05/21 0538  HGBA1C 5.8*   CBG: Recent Labs  Lab 07/05/21 0743 07/05/21 1129 07/05/21 1535 07/05/21 2149 07/06/21 0917  GLUCAP 217* 198* 161* 198* 184*  Lipid Profile: No results for input(s): CHOL, HDL, LDLCALC, TRIG, CHOLHDL, LDLDIRECT in the last 72 hours. Thyroid Function Tests: No results for input(s): TSH, T4TOTAL, FREET4, T3FREE, THYROIDAB in the last 72 hours. Anemia Panel: No results for input(s): VITAMINB12, FOLATE, FERRITIN, TIBC, IRON, RETICCTPCT in the last 72 hours. Sepsis Labs: No results for input(s): PROCALCITON, LATICACIDVEN in the last 168 hours.  Recent Results (from the past 240 hour(s))  Resp Panel by RT-PCR (Flu A&B, Covid) Nasopharyngeal Swab     Status: None   Collection Time: 07/01/21  5:39 PM   Specimen: Nasopharyngeal Swab; Nasopharyngeal(NP) swabs in vial transport medium  Result Value Ref Range Status   SARS Coronavirus 2 by RT PCR NEGATIVE NEGATIVE Final    Comment: (NOTE) SARS-CoV-2 target nucleic acids are NOT DETECTED.  The SARS-CoV-2 RNA is generally detectable in upper respiratory specimens during the acute phase of infection. The lowest concentration of SARS-CoV-2 viral copies this assay can detect is 138 copies/mL. A negative result does not preclude SARS-Cov-2 infection and should not be used as the sole basis for treatment or other patient management decisions. A negative result may occur with  improper specimen collection/handling, submission of specimen other than nasopharyngeal swab, presence of viral mutation(s) within  the areas targeted by this assay, and inadequate number of viral copies(<138 copies/mL). A negative result must be combined with clinical observations, patient history, and epidemiological information. The expected result is Negative.  Fact Sheet for Patients:  EntrepreneurPulse.com.au  Fact Sheet for Healthcare Providers:  IncredibleEmployment.be  This test is no t yet approved or cleared by the [place] FDA and  has been authorized for detection and/or diagnosis of SARS-CoV-2 by FDA under an Emergency Use Authorization (EUA). This EUA will remain  in effect (meaning this test can be used) for the duration of the COVID-19 declaration under Section 564(b)(1) of the Act, 21 U.S.C.section 360bbb-3(b)(1), unless the authorization is terminated  or revoked sooner.       Influenza A by PCR NEGATIVE NEGATIVE Final   Influenza B by PCR NEGATIVE NEGATIVE Final    Comment: (NOTE) The Xpert Xpress SARS-CoV-2/FLU/RSV plus assay is intended as an aid in the diagnosis of influenza from Nasopharyngeal swab specimens and should not be used as a sole basis for treatment. Nasal washings and aspirates are unacceptable for Xpert Xpress SARS-CoV-2/FLU/RSV testing.  Fact Sheet for Patients: EntrepreneurPulse.com.au  Fact Sheet for Healthcare Providers: IncredibleEmployment.be  This test is not yet approved or cleared by the [place] FDA and has been authorized for detection and/or diagnosis of SARS-CoV-2 by FDA under an Emergency Use Authorization (EUA). This EUA will remain in effect (meaning this test can be used) for the duration of the COVID-19 declaration under Section 564(b)(1) of the Act, 21 U.S.C. section 360bbb-3(b)(1), unless the authorization is terminated or revoked.  Performed at [facility], [place] [address], [place], [place] [address]   MRSA Next Gen by PCR, Nasal     Status: None    Collection Time: 07/02/21  6:26 PM   Specimen: Nasal Mucosa; Nasal Swab  Result Value Ref Range Status   MRSA by PCR Next Gen NOT DETECTED NOT DETECTED Final    Comment: (NOTE) The GeneXpert MRSA Assay (FDA approved for NASAL specimens only), is one component of a comprehensive MRSA colonization surveillance program. It is not intended to diagnose MRSA infection nor to guide or monitor treatment for MRSA infections. Test performance is not FDA approved in patients less than 71 years old.  Performed at [facility], [place] [address], [place], [place] [address]           Radiology Studies: DG Abd Portable 1V  Result Date: 07/04/2021 CLINICAL DATA:  Feeding tube EXAM: PORTABLE ABDOMEN - 1 VIEW COMPARISON:  11/06/2014, CT 07/01/2021 FINDINGS: Feeding tube coiled over the stomach. Tip directed to the patient's left. IVC filter to the right of L1 and L2. Nonobstructed gas pattern. IMPRESSION: Feeding tube coiled over the stomach Electronically Signed   By: Donavan Foil M.D.   On: 07/04/2021 16:15        Scheduled Meds:  acetylcysteine  2 mL Nebulization TID   amLODipine  5 mg Oral Daily   chlorhexidine gluconate (MEDLINE KIT)  15 mL Mouth Rinse BID   Chlorhexidine Gluconate Cloth  6 each Topical Daily   feeding supplement (PROSource TF)  45 mL Per Tube BID   guaiFENesin  10 mL Oral Q8H   insulin aspart  0-5 Units Subcutaneous QHS   insulin aspart  0-9 Units Subcutaneous TID WC   ipratropium-albuterol  3 mL Nebulization TID   lidocaine  1 application Urethral Once   mouth rinse  15 mL Mouth Rinse 10 times per day   metoprolol tartrate  12.5 mg Oral Daily   nystatin  5 mL Oral QID   sennosides  5 mL Oral BID   Continuous Infusions:  cefTRIAXone (ROCEPHIN)  IV 1 g (07/05/21 2039)   feeding supplement (OSMOLITE 1.5 CAL) 1,000 mL (07/06/21 0906)     LOS: 5 days    Time spent: 35 minutes.     Elmarie Shiley, MD [facility]   If 7PM-7AM, please contact  night-coverage [email or web address]  07/06/2021, 11:33 AM

## 2021-07-07 ENCOUNTER — Inpatient Hospital Stay (HOSPITAL_COMMUNITY): Payer: Medicare Other

## 2021-07-07 DIAGNOSIS — J9601 Acute respiratory failure with hypoxia: Principal | ICD-10-CM

## 2021-07-07 LAB — CBC
HCT: 39.4 % (ref 39.0–52.0)
Hemoglobin: 12.6 g/dL — ABNORMAL LOW (ref 13.0–17.0)
MCH: 30.1 pg (ref 26.0–34.0)
MCHC: 32 g/dL (ref 30.0–36.0)
MCV: 94.3 fL (ref 80.0–100.0)
Platelets: 142 10*3/uL — ABNORMAL LOW (ref 150–400)
RBC: 4.18 MIL/uL — ABNORMAL LOW (ref 4.22–5.81)
RDW: 14.1 % (ref 11.5–15.5)
WBC: 13.3 10*3/uL — ABNORMAL HIGH (ref 4.0–10.5)
nRBC: 0 % (ref 0.0–0.2)

## 2021-07-07 LAB — BASIC METABOLIC PANEL
Anion gap: 8 (ref 5–15)
BUN: 26 mg/dL — ABNORMAL HIGH (ref 8–23)
CO2: 26 mmol/L (ref 22–32)
Calcium: 8.8 mg/dL — ABNORMAL LOW (ref 8.9–10.3)
Chloride: 105 mmol/L (ref 98–111)
Creatinine, Ser: 0.79 mg/dL (ref 0.61–1.24)
GFR, Estimated: >60 mL/min (ref >60)
Glucose, Bld: 144 mg/dL — ABNORMAL HIGH (ref 70–99)
Potassium: 4.3 mmol/L (ref 3.5–5.1)
Sodium: 139 mmol/L (ref 135–145)

## 2021-07-07 LAB — GLUCOSE, CAPILLARY
Glucose-Capillary: 128 mg/dL — ABNORMAL HIGH (ref 70–99)
Glucose-Capillary: 153 mg/dL — ABNORMAL HIGH (ref 70–99)
Glucose-Capillary: 144 mg/dL — ABNORMAL HIGH (ref 70–99)
Glucose-Capillary: 138 mg/dL — ABNORMAL HIGH (ref 70–99)

## 2021-07-07 MED ORDER — TRAMADOL HCL 50 MG PO TABS
50.0000 mg | ORAL_TABLET | Freq: Four times a day (QID) | ORAL | Status: DC | PRN
Start: 2021-07-07 — End: 2021-07-09
  Administered 2021-07-07 – 2021-07-09 (×3): 50 mg via ORAL
  Filled 2021-07-07 (×3): qty 1

## 2021-07-07 MED ORDER — BUDESONIDE 0.25 MG/2ML IN SUSP
0.2500 mg | Freq: Two times a day (BID) | RESPIRATORY_TRACT | Status: DC
  Administered 2021-07-07 – 2021-08-12 (×71): 0.25 mg via RESPIRATORY_TRACT
  Filled 2021-07-07 (×72): qty 2

## 2021-07-07 MED ORDER — CEFTRIAXONE SODIUM 1 G IJ SOLR: 1.0000 g | INTRAMUSCULAR | Status: DC

## 2021-07-07 MED ORDER — IPRATROPIUM-ALBUTEROL 0.5-2.5 (3) MG/3ML IN SOLN
3.0000 mL | Freq: Two times a day (BID) | RESPIRATORY_TRACT | Status: DC
  Administered 2021-07-07: 3 mL via RESPIRATORY_TRACT
  Filled 2021-07-07: qty 3

## 2021-07-07 MED ORDER — SODIUM CHLORIDE 0.9% FLUSH
10.0000 mL | Freq: Two times a day (BID) | INTRAVENOUS | Status: DC
  Administered 2021-07-07 – 2021-07-10 (×5): 10 mL
  Administered 2021-07-11: 20 mL
  Administered 2021-07-12 – 2021-08-10 (×55): 10 mL
  Administered 2021-08-11: 20 mL
  Administered 2021-08-11 – 2021-08-12 (×2): 10 mL

## 2021-07-07 MED ORDER — VANCOMYCIN HCL 1250 MG/250ML IV SOLN
1250.0000 mg | Freq: Once | INTRAVENOUS | Status: AC
  Administered 2021-07-07: 1250 mg via INTRAVENOUS
  Filled 2021-07-07: qty 250

## 2021-07-07 MED ORDER — SODIUM CHLORIDE 0.9% FLUSH
10.0000 mL | INTRAVENOUS | Status: DC | PRN
  Administered 2021-07-26: 10 mL

## 2021-07-07 MED ORDER — ACETAMINOPHEN 500 MG PO TABS
500.0000 mg | ORAL_TABLET | Freq: Three times a day (TID) | ORAL | Status: DC
  Administered 2021-07-07 – 2021-07-11 (×13): 500 mg via ORAL
  Filled 2021-07-07 (×13): qty 1

## 2021-07-07 MED ORDER — VANCOMYCIN HCL IN DEXTROSE 1-5 GM/200ML-% IV SOLN
1000.0000 mg | INTRAVENOUS | Status: DC
Start: 2021-07-08 — End: 2021-07-09
  Administered 2021-07-08 – 2021-07-09 (×2): 1000 mg via INTRAVENOUS
  Filled 2021-07-07 (×2): qty 200

## 2021-07-07 MED ORDER — SODIUM CHLORIDE 0.9 % IV SOLN
2.0000 g | Freq: Two times a day (BID) | INTRAVENOUS | Status: DC
  Administered 2021-07-07 – 2021-07-10 (×7): 2 g via INTRAVENOUS
  Filled 2021-07-07 (×7): qty 2

## 2021-07-07 MED ORDER — ARFORMOTEROL TARTRATE 15 MCG/2ML IN NEBU
15.0000 ug | INHALATION_SOLUTION | Freq: Two times a day (BID) | RESPIRATORY_TRACT | Status: DC
  Administered 2021-07-07 – 2021-08-12 (×71): 15 ug via RESPIRATORY_TRACT
  Filled 2021-07-07 (×72): qty 2

## 2021-07-07 MED ORDER — REVEFENACIN 175 MCG/3ML IN SOLN
175.0000 ug | Freq: Every day | RESPIRATORY_TRACT | Status: DC
  Administered 2021-07-07 – 2021-08-12 (×36): 175 ug via RESPIRATORY_TRACT
  Filled 2021-07-07 (×37): qty 3

## 2021-07-07 NOTE — Progress Notes (Signed)
  Speech Language Pathology Treatment: Nada Boozer Speaking valve  Patient Details Name: Christopher Santiago MRN: [id number] DOB: 1931-07-26 Today's Date: 07/07/2021 Time: 0910-0950 SLP Time Calculation (min) (ACUTE ONLY): 40 min  Assessment / Plan / Recommendation Clinical Impression  Pt was seen for PMV treatment.  RT present and finishing treatment.  Removed 3 ml of air from cuff. Secretions are better today.  PMV placed; initially, pt with marginal toleration, with intermittent desaturations and explosive coughing.  After a few minutes, valve was replaced and pt was able to expectorate secretions orally, using Christopher Santiago himself with some physical assist.    Christopher Santiago was more confused today.  He was able to achieve low volume, wet voice. Intermittent removal of valve revealed improved access to upper airway, no air trapping. VS remained stable, and he continued to demonstrate improved ability to expectorate secretions.  His hearing aids were not present - he stated they were at home - their absence impacts communication significantly. Will reach out to his wife to see if she is comfortable leaving them here.  Planning for repeat MBS today, scheduled for 1:00 pm.  Please place PMV when staff is present in room; remove upon departure for safety.    HPI HPI: Pt is an 87 y/o male admitted 8/10 secondary to worsening SOB, especially with exertion. Found to have acute respiratory failure with hypoxia. Imaging showed fullness of the right aryepiglottic fold extending into the right pyriform and L renal mass. Pt had flexible laryngoscopy on 8/10, including trach.  Findings revealed bilateral true vocal folds in paramedian position with normal adduction, restricted abduction causing narrowing of the airway. No evidence of airway obstruction. Mucosal abnormality/ edema of right supraglottis/glottis noted with no exophytic mass lesion, biopsy showed squamous cell carcinoma.  PMH includes vocal cord cancer ~  30 years ago, COPD, CAD, DM, HTN, R THA, bilateral hearing aids.      SLP Plan  Continue with current plan of care       Recommendations  Diet recommendations: NPO      Patient may use Passy-Muir Speech Valve:  (with staff supervision) PMSV Supervision: Full         Oral Care Recommendations: Oral care QID Follow up Recommendations: Outpatient SLP;Home health SLP SLP Visit Diagnosis: Aphonia (R49.1) Plan: Continue with current plan of care       Ludy Messamore L. Tivis Ringer, [place] Office number [phone] Pager [phone]                 Christopher Santiago 07/07/2021, 10:13 AM

## 2021-07-07 NOTE — Progress Notes (Signed)
Pharmacy Antibiotic Note  Christopher Santiago is a 87 y.o. male with rspiratory failure, possible PNA.  Pharmacy has been consulted for Vancomycin and Cefepime dosing.  Plan: Vancomycin 1250 mg IV now, then Vancomycin 1000 mg IV q24h Cefepime 2 g IV q12h  Height: 5\' 6"  (167.6 cm) Weight: 71.6 kg (157 lb 13.6 oz) IBW/kg (Calculated) : 63.8  Temp (24hrs), Avg:98 F (36.7 C), Min:97.9 F (36.6 C), Max:98 F (36.7 C)  Recent Labs  Lab 07/02/21 0403 07/03/21 0507 07/04/21 0336 07/05/21 0538 07/06/21 0240  WBC 6.4 17.7* 15.0* 12.6* 12.4*  CREATININE 1.21 1.18 0.92 1.10 0.94    Estimated Creatinine Clearance: 49 mL/min (by C-G formula based on SCr of 0.94 mg/dL).    Allergies  Allergen Reactions   Tessalon [Benzonatate] Other (See Comments)    Ineffective, nasal congestion    Caryl Pina 07/07/2021 7:40 AM

## 2021-07-07 NOTE — Progress Notes (Signed)
PROGRESS NOTE    ULMER DEGEN  EPP:[id number] DOB: 1931-04-26 DOA: 07/01/2021 PCP: Tonia Ghent, MD   Brief Narrative: 87 year old with past medical history significant for COPD, quit smoking 30 years ago, vocal cord cancer, diabetes who presents complaining of exertional dyspnea, reports sinusitis, postnasal drip, hoarseness for the past 3 to 4 months.  She is also complaining of difficulty swallowing, food gets stuck in the throat, he also report phlegm, in his throat.   CT maxillofacial, soft tissue neck: Asymmetric soft tissue density/fullness at the base of the right aryepiglottic fold, extending into the right piriform sinus with possible involvement of the right glottis inferiorly.  If echo to measure or define a discrete mass within this region given lack of IV contrast.  ENT consulted for direct visualization.  Patient underwent  laryngoscopy with biopsy and tracheostomy 8/10.    Assessment & Plan:   Principal Problem:   Acute respiratory failure with hypoxia (HCC) Active Problems:   Essential hypertension   COPD with acute exacerbation (HCC)   Left renal mass   Respiratory failure, acute (HCC)   Protein-calorie malnutrition, severe   Tracheostomy status (HCC)   Tracheobronchitis  1-Acute Hypoxic Respiratory Failure: Suspect related to component of aspiration due to dysphagia larynx CA  COPD exacerbation/  Continue with nebulizer and Completed 5 days of IV steroid. CT: With asymmetric soft tissue density fullness at the base of the right aryepiglottic fold: ENT consulted.  Patient underwent laryngoscopy with biopsy and tracheostomy /8/11. Stable on ATC 5 L oxygen.  Per ENT; ok to deflate cuff, keep obturator taped to head of bed, keep spare Shiley trach at bedside. Speech once cuff is deflated. Do not cut trach suture until POD 5, plan to exchange cuffless trach on POD 5 if stable. ---Will ask Dr Fredric Dine to follow on patient.  Continue speech trial PMSV Started on  Mucomyst TID.  Culture from tach growing rare negative rods, few gram-positive cocci in clusters.  Antibiotics change to Vancomycin and Cefepime.  He did better today with speech, plan for Modified Swallow eval.  Ativan Q 6 hour PRN for anxiety.   2-Squamous cell carcinoma Larynx:  CT: With asymmetric soft tissue density fullness at the base of the right aryepiglottic fold: ENT consulted.  Patient underwent laryngoscopy with biopsy and tracheostomy /8/11. Stable on ATC 5 L oxygen.  Biopsy showed squamous cell carcinoma with foci suspicious for invasion.  Dr Fredric Dine twill follow up on patient today.  Tumor board discussion on wednesday.   1.2 cm spiculated right upper lobe nodule: He will need follow-up for these  3-Left Renal mass: Concerning for renal carcinoma Will need to follow-up with urology  Diabetes: Not on medication. Check CBG  HTN;  Resume metoprolol Continue with PRN hydralazine.  Will add Norvasc.   Leukocytosis probably related to steroids.  Anxiety: low dose benzo.   Nutrition;  Continue with tube feeding.  Swallowing function today   Estimated body mass index is 25.48 kg/m as calculated from the following:   Height as of this encounter: _0  (1.676 m).   Weight as of this encounter: 71.6 kg.   DVT prophylaxis: Hold Lovenox Code Status: Full Code Family Communication: Wife 8/14. Disposition Plan:  Status is: Inpatient  Remains inpatient appropriate because:IV treatments appropriate due to intensity of illness or inability to take PO  Dispo: The patient is from: Home              Anticipated d/c is to: to be determine  Patient currently is not medically stable to d/c.   Difficult to place patient No        Consultants:  ENT CCM for post Sx management   Procedures:  Laryngoscopy   Antimicrobials:    Subjective: He is alert, he is still having a lot of secretion from trach, he is more calm and does not look anxious or  in pain today.   Objective: Vitals:   07/06/21 2105 07/06/21 2347 07/07/21 0400 07/07/21 0501  BP: (!) 138/57     Pulse: 82     Resp: 18     Temp: 97.9 F (36.6 C)     TempSrc: Oral     SpO2: 98% 100% 96%   Weight:    71.6 kg  Height:        Intake/Output Summary (Last 24 hours) at 07/07/2021 1248 Last data filed at 07/07/2021 0522 Gross per 24 hour  Intake 1973 ml  Output 1300 ml  Net 673 ml    Filed Weights   07/04/21 0629 07/05/21 0320 07/07/21 0501  Weight: 68.9 kg 70.8 kg 71.6 kg    Examination:  General exam: NAD, Trach in place,  Respiratory system: CTA Cardiovascular system: S 1, S 2 RRR Gastrointestinal system: BS present, soft, nt Central nervous system: Alert Extremities: No edema Skin: no rashes.    Data Reviewed: I have personally reviewed following labs and imaging studies  CBC: Recent Labs  Lab 07/01/21 1729 07/02/21 0403 07/03/21 0507 07/04/21 0336 07/05/21 0538 07/06/21 0240 07/07/21 1128  WBC 7.1   < > 17.7* 15.0* 12.6* 12.4* 13.3*  NEUTROABS 4.9  --  15.8*  --   --   --   --   HGB 11.4*   < > 11.4* 12.0* 11.7* 11.7* 12.6*  HCT 37.5*   < > 36.3* 38.2* 37.9* 37.0* 39.4  MCV 98.9   < > 98.1 98.7 98.4 96.6 94.3  PLT 197   < > 171 153 167 147* 142*   < > = values in this interval not displayed.    Basic Metabolic Panel: Recent Labs  Lab 07/03/21 0507 07/04/21 0336 07/05/21 0538 07/05/21 1646 07/06/21 0240 07/07/21 1128  NA 141 141 141  --  139 139  K 4.1 4.0 4.1  --  4.8 4.3  CL 103 105 107  --  106 105  CO2 _0 --  23 26  GLUCOSE 167* 119* 220*  --  226* 144*  BUN 28* 22 35*  --  34* 26*  CREATININE 1.18 0.92 1.10  --  0.94 0.79  CALCIUM 9.2 9.1 9.1  --  9.0 8.8*  MG 2.6* 2.5* 2.7* 2.6*  --   --   PHOS  --  3.4 2.7 2.4* 2.5  --     GFR: Estimated Creatinine Clearance: 57.6 mL/min (by C-G formula based on SCr of 0.79 mg/dL). Liver Function Tests: Recent Labs  Lab 07/01/21 1729  AST 18  ALT 10  ALKPHOS 39   BILITOT 0.9  PROT 6.6  ALBUMIN 3.2*    No results for input(s): LIPASE, AMYLASE in the last 168 hours. No results for input(s): AMMONIA in the last 168 hours. Coagulation Profile: No results for input(s): INR, PROTIME in the last 168 hours. Cardiac Enzymes: No results for input(s): CKTOTAL, CKMB, CKMBINDEX, TROPONINI in the last 168 hours. BNP (last 3 results) Recent Labs    11/08/20 1106  PROBNP 482.0*    HbA1C: Recent Labs    07/05/21  0538  HGBA1C 5.8*    CBG: Recent Labs  Lab 07/06/21 1624 07/06/21 2109 07/06/21 2301 07/07/21 0329 07/07/21 0802  GLUCAP 157* 187* 144* 128* 153*    Lipid Profile: No results for input(s): CHOL, HDL, LDLCALC, TRIG, CHOLHDL, LDLDIRECT in the last 72 hours. Thyroid Function Tests: No results for input(s): TSH, T4TOTAL, FREET4, T3FREE, THYROIDAB in the last 72 hours. Anemia Panel: No results for input(s): VITAMINB12, FOLATE, FERRITIN, TIBC, IRON, RETICCTPCT in the last 72 hours. Sepsis Labs: No results for input(s): PROCALCITON, LATICACIDVEN in the last 168 hours.  Recent Results (from the past 240 hour(s))  Resp Panel by RT-PCR (Flu A&B, Covid) Nasopharyngeal Swab     Status: None   Collection Time: 07/01/21  5:39 PM   Specimen: Nasopharyngeal Swab; Nasopharyngeal(NP) swabs in vial transport medium  Result Value Ref Range Status   SARS Coronavirus 2 by RT PCR NEGATIVE NEGATIVE Final    Comment: (NOTE) SARS-CoV-2 target nucleic acids are NOT DETECTED.  The SARS-CoV-2 RNA is generally detectable in upper respiratory specimens during the acute phase of infection. The lowest concentration of SARS-CoV-2 viral copies this assay can detect is 138 copies/mL. A negative result does not preclude SARS-Cov-2 infection and should not be used as the sole basis for treatment or other patient management decisions. A negative result may occur with  improper specimen collection/handling, submission of specimen other than nasopharyngeal swab,  presence of viral mutation(s) within the areas targeted by this assay, and inadequate number of viral copies(<138 copies/mL). A negative result must be combined with clinical observations, patient history, and epidemiological information. The expected result is Negative.  Fact Sheet for Patients:  EntrepreneurPulse.com.au  Fact Sheet for Healthcare Providers:  IncredibleEmployment.be  This test is no t yet approved or cleared by the [place] FDA and  has been authorized for detection and/or diagnosis of SARS-CoV-2 by FDA under an Emergency Use Authorization (EUA). This EUA will remain  in effect (meaning this test can be used) for the duration of the COVID-19 declaration under Section 564(b)(1) of the Act, 21 U.S.C.section 360bbb-3(b)(1), unless the authorization is terminated  or revoked sooner.       Influenza A by PCR NEGATIVE NEGATIVE Final   Influenza B by PCR NEGATIVE NEGATIVE Final    Comment: (NOTE) The Xpert Xpress SARS-CoV-2/FLU/RSV plus assay is intended as an aid in the diagnosis of influenza from Nasopharyngeal swab specimens and should not be used as a sole basis for treatment. Nasal washings and aspirates are unacceptable for Xpert Xpress SARS-CoV-2/FLU/RSV testing.  Fact Sheet for Patients: EntrepreneurPulse.com.au  Fact Sheet for Healthcare Providers: IncredibleEmployment.be  This test is not yet approved or cleared by the [place] FDA and has been authorized for detection and/or diagnosis of SARS-CoV-2 by FDA under an Emergency Use Authorization (EUA). This EUA will remain in effect (meaning this test can be used) for the duration of the COVID-19 declaration under Section 564(b)(1) of the Act, 21 U.S.C. section 360bbb-3(b)(1), unless the authorization is terminated or revoked.  Performed at [facility], [place] [address] [place], [place] [address]   MRSA Next Gen by  PCR, Nasal     Status: None   Collection Time: 07/02/21  6:26 PM   Specimen: Nasal Mucosa; Nasal Swab  Result Value Ref Range Status   MRSA by PCR Next Gen NOT DETECTED NOT DETECTED Final    Comment: (NOTE) The GeneXpert MRSA Assay (FDA approved for NASAL specimens only), is one component of a comprehensive MRSA colonization  surveillance program. It is not intended to diagnose MRSA infection nor to guide or monitor treatment for MRSA infections. Test performance is not FDA approved in patients less than 49 years old. Performed at [facility], [place] [address], [place], [place] [address]   Culture, Respiratory w Gram Stain     Status: None (Preliminary result)   Collection Time: 07/06/21  2:17 PM   Specimen: Tracheal Aspirate; Respiratory  Result Value Ref Range Status   Specimen Description TRACHEAL ASPIRATE  Final   Special Requests NONE  Final   Gram Stain   Final    RARE WBC PRESENT, PREDOMINANTLY PMN FEW GRAM POSITIVE COCCI IN CLUSTERS RARE GRAM NEGATIVE RODS RARE GRAM POSITIVE RODS    Culture   Final    CULTURE REINCUBATED FOR BETTER GROWTH Performed at [facility], [place] [address] [place], [place] [address]    Report Status PENDING  Incomplete          Radiology Studies: DG CHEST PORT 1 VIEW  Result Date: 07/06/2021 CLINICAL DATA:  Tracheostomy status EXAM: PORTABLE CHEST 1 VIEW COMPARISON:  July 01, 2021 FINDINGS: The feeding tube terminates below today's film. A tracheostomy tube projects over the tracheal air column. No pneumothorax. The cardiomediastinal silhouette is unremarkable. Stable cardiomegaly. Small pleural effusion versus pleural thickening on the left. No focal pulmonary infiltrates identified. IMPRESSION: 1. Support apparatus as above. 2. Small left pleural effusion versus pleural thickening. 3. No other abnormalities. Electronically Signed   By: Dorise Bullion III M.D.   On: 07/06/2021 15:28        Scheduled Meds:  amLODipine  5 mg  Oral Daily   chlorhexidine gluconate (MEDLINE KIT)  15 mL Mouth Rinse BID   Chlorhexidine Gluconate Cloth  6 each Topical Daily   feeding supplement (PROSource TF)  45 mL Per Tube BID   guaiFENesin  10 mL Oral Q8H   insulin aspart  0-5 Units Subcutaneous QHS   insulin aspart  0-9 Units Subcutaneous TID WC   ipratropium-albuterol  3 mL Nebulization BID   lidocaine  1 application Urethral Once   mouth rinse  15 mL Mouth Rinse 10 times per day   metoprolol tartrate  12.5 mg Oral Daily   nystatin  5 mL Oral QID   sennosides  5 mL Oral BID   sodium chloride HYPERTONIC  4 mL Nebulization BID   Continuous Infusions:  ceFEPime (MAXIPIME) IV 2 g (07/07/21 1236)   feeding supplement (OSMOLITE 1.5 CAL) 1,000 mL (07/07/21 0523)   [START ON 07/08/2021] vancomycin     vancomycin 1,250 mg (07/07/21 1242)     LOS: 6 days    Time spent: 35 minutes.     Elmarie Shiley, MD [facility]   If 7PM-7AM, please contact night-coverage [email or web address]  07/07/2021, 12:48 PM

## 2021-07-07 NOTE — Progress Notes (Signed)
Pt's trach changed by Dr Fredric Dine from #6 XLT-P cuffed to #6 XLT-P uncuffed without any complications. Placement confirmed with disposable bronchscope.

## 2021-07-07 NOTE — Progress Notes (Signed)
NAME:  Christopher Santiago, MRN:  [id number], DOB:  01-Feb-1931, LOS: 6 ADMISSION DATE:  07/01/2021, CONSULTATION DATE:  07/07/2021  REFERRING MD:  Tyrell Antonio TRH, CHIEF COMPLAINT: Dyspnea and dysphagia  Brief History   Christopher Santiago is an 87 year old male with history of laryngeal cancer (~30 years ago) and COPD who presented to the ED on 07/01/21 for progressive dyspnea and dysphagia over the past few months and was found to have laryngeal narrowing due to mucosal abnormalities and edema.  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 We are reconsulted 8/14 for increased secretions and increased oxygen requirements on the floor   Past Medical History   DVT/PE status post IVC filter CAD Has an aortic aneurysm  Significant Hospital Events   8/9--admitted 8/10--laryngeal biopsy and tracheostomy  Consults:  PCCM ENT   Significant Diagnostic Tests:  8/9 CXR>>RUL nodule 8/9 CTA chest>>emphysema with lower lobe bronchial thickening and mucous plugging   Micro Data:    Antimicrobials:  8/15 Cefepime + Vancomycin 8/9 to 8/14 Ceftriaxone  Interim history/subjective:   Speech therapy at the bedside. PMV trial went well.  Secretions are improved orally and via trach Patient has decent cough strength.  Objective   Blood pressure (!) 138/57, pulse 82, temperature 97.9 F (36.6 C), temperature source Oral, resp. rate 18, height 5\' 6"  (1.676 m), weight 71.6 kg, SpO2 96 %.    FiO2 (%):  [21 %] 21 %   Intake/Output Summary (Last 24 hours) at 07/07/2021 1441 Last data filed at 07/07/2021 0522 Gross per 24 hour  Intake 1973 ml  Output 1300 ml  Net 673 ml   Filed Weights   07/04/21 0629 07/05/21 0320 07/07/21 0501  Weight: 68.9 kg 70.8 kg 71.6 kg    Examination: General: Elderly man, sitting up in bed, no distress HENT: Tracheostomy Shiley #6 mild pallor, no icterus, no JVD, no lymphadenopathy,  thick white secretions orally Lungs: no accessory muscle use, decreased breath sounds bilateral, no rhonchi Cardiovascular: S1-S2 distant, regular Abdomen: Soft, nontender Extremities: No edema, no deformity, bruising over both shins Neuro: Hard of hearing, alert and interactive, nonfocal  Resolved Hospital Problem list     Assessment & Plan:  Recurrence of laryngeal cancer s/p tracheostomy Acute Hypoxemic respiratory Failure Tracheobronchitis COPD/Emphysema RUL Nodule  Secretions have overall improved with antibiotic and nebulizer treatments.  - Continue mucinex - Abx per primary team, now treating with vancomycin and cefepime. Pending respiratory culture - Will change nebulizer treatments to yupelri, brovana and budesonide for his emphysema and COPD along with the goal to help with bronchial hygiene. - Hypertonic saline nebs BID for bronchial hygiene. May discontinue once secretions under better control. - trach management per ENT  PCCM will sign off. Please call if any further questions.  Best practice:  Diet: N.p.o.  DVT prophylaxis: SQheparin postop GI prophylaxis: N/A  Code Status: Full Family Communication: wife at bedside   Labs   CBC: Recent Labs  Lab 07/01/21 1729 07/02/21 0403 07/03/21 0507 07/04/21 0336 07/05/21 0538 07/06/21 0240 07/07/21 1128  WBC 7.1   < > 17.7* 15.0* 12.6* 12.4* 13.3*  NEUTROABS 4.9  --  15.8*  --   --   --   --   HGB 11.4*   < > 11.4* 12.0* 11.7* 11.7* 12.6*  HCT 37.5*   < > 36.3* 38.2* 37.9* 37.0* 39.4  MCV 98.9   < > 98.1 98.7 98.4 96.6 94.3  PLT 197   < > 171 153 167 147* 142*   < > = values in this interval not displayed.    Basic Metabolic Panel: Recent Labs  Lab 07/03/21 0507 07/04/21 0336 07/05/21 0538 07/05/21 1646 07/06/21 0240 07/07/21 1128  NA 141 141 141  --  139 139  K 4.1 4.0 4.1  --  4.8 4.3  CL 103 105 107  --  106 105  CO2 31 27 26   --  23 26  GLUCOSE 167* 119* 220*  --  226* 144*  BUN 28* 22 35*   --  34* 26*  CREATININE 1.18 0.92 1.10  --  0.94 0.79  CALCIUM 9.2 9.1 9.1  --  9.0 8.8*  MG 2.6* 2.5* 2.7* 2.6*  --   --   PHOS  --  3.4 2.7 2.4* 2.5  --    GFR: Estimated Creatinine Clearance: 57.6 mL/min (by C-G formula based on SCr of 0.79 mg/dL). Recent Labs  Lab 07/04/21 0336 07/05/21 0538 07/06/21 0240 07/07/21 1128  WBC 15.0* 12.6* 12.4* 13.3*    Liver Function Tests: Recent Labs  Lab 07/01/21 1729  AST 18  ALT 10  ALKPHOS 39  BILITOT 0.9  PROT 6.6  ALBUMIN 3.2*   No results for input(s): LIPASE, AMYLASE in the last 168 hours. No results for input(s): AMMONIA in the last 168 hours.  ABG    Component Value Date/Time   PHART 7.452 (H) 02/10/2016 0831   PCO2ART 38.7 02/10/2016 0831   PO2ART 66.0 (L) 02/10/2016 0831   HCO3 27.1 (H) 02/10/2016 0831   TCO2 28 02/10/2016 0831   O2SAT 94.0 02/10/2016 0831     Coagulation Profile: No results for input(s): INR, PROTIME in the last 168 hours.  Cardiac Enzymes: No results for input(s): CKTOTAL, CKMB, CKMBINDEX, TROPONINI in the last 168 hours.  HbA1C: Hemoglobin A1C  Date/Time Value Ref Range Status  11/05/2014 05:54 AM 6.3 4.2 - 6.3 % Final    Comment:    The American Diabetes Association recommends that a primary goal of therapy should be <7% and that physicians should reevaluate the treatment regimen in patients with HbA1c values consistently >8%.    Hgb A1c MFr Bld  Date/Time Value Ref Range Status  07/05/2021 05:38 AM 5.8 (H) 4.8 - 5.6 % Final    Comment:    (NOTE) Pre diabetes:          5.7%-6.4%  Diabetes:              >6.4%  Glycemic control for   <7.0% adults with diabetes   11/08/2018 02:51 PM 5.3 4.8 - 5.6 % Final    Comment:    (NOTE) Pre diabetes:          5.7%-6.4% Diabetes:              >6.4% Glycemic control for   <7.0% adults with diabetes     CBG: Recent Labs  Lab 07/06/21 1624 07/06/21 2109 07/06/21 2301 07/07/21 0329 07/07/21 0802  GLUCAP 157* 187* [place], MD [facility] Office: [phone]   See Amion for personal pager PCCM on call pager [phone] until 7pm. Please call Elink 7p-7a. [phone]

## 2021-07-07 NOTE — Progress Notes (Signed)
Modified Barium Swallow Progress Note  Patient Details  Name: Christopher Santiago MRN: [id number] Date of Birth: September 11, 1931  Today's Date: 07/07/2021  Modified Barium Swallow completed.  Full report located under Chart Review in the Imaging Section.  Brief recommendations include the following:  Clinical Impression  [name] participated in repeat MBS today - his secretions were much better; he was able to tolerate the PMV in earlier session today with good access to upper airway; strong cough; improved phonation.  He presented with cuff deflated; PMV was placed and toleration was established. VS were stable.  He spoke with good volume, demonstrated some confusion- he expressed concerns throughout the session about a package across the room that required shipping, and it was difficult to get him to focus on the study.  Unfortunately, despite ability to use valve today, swallow performance was relatively consistent with function on 07/04/21 - dysphagia is severe.  He had poor mobility of the larynx, ineffective laryngeal vestibule closure, and reduced pharyngeal squeeze, leading to immediate penetration and then aspiration of nectar barium.  Cough response was spontaneous but not protective.  Purees filled vallecular/pyriform spaces and remained there despite [name] best efforts to swallow.  He was able to expectorate portions orally, but could not pass pureed barium through pharynx and into cervical esophagus. Study was terminated; oral suctioning provided; PMV removed and placed in container.  Recommend continued NPO; encourage pt to assist with his own oral care; allow ice chips.  SLP will follow for POC and for education with pt/wife.   Swallow Evaluation Recommendations       SLP Diet Recommendations: Ice chips PRN after oral care;NPO       Medication Administration: Via alternative means               Oral Care Recommendations: Oral care QID      Shadi Larner L. Tivis Ringer, [place] Office number [phone] Pager [phone]   Juan Quam Laurice 07/07/2021,2:37 PM

## 2021-07-07 NOTE — Progress Notes (Signed)
PIV consult: Pt has had several failed PIVs this admission. Midline inserted in L upper arm. Site bruised. Please consider PICC or central line if vancomycin is needed beyond 8/21.

## 2021-07-07 NOTE — Progress Notes (Signed)
ENT PROGRESS NOTE   Subjective: Patient seen and examined at bedside.  Patient has been stable on trach collar.  He is tolerating tube feeds.  Barium swallow was performed earlier today by SLP, with penetration and then aspiration of nectar barium.  SLP recommends continued n.p.o.  PCM had been consulted by hospital medicine for increased secretions and oxygen requirements.  Patient currently on cefepime and vancomycin, with respiratory cultures pending.  Objective: Vital signs in last 24 hours: Temp:  [97.9 F (36.6 C)-98 F (36.7 C)] 97.9 F (36.6 C) (08/14 2105) Pulse Rate:  [82-84] 82 (08/14 2105) Resp:  [18] 18 (08/14 2105) BP: (138-160)/(57-63) 138/57 (08/14 2105) SpO2:  [92 %-100 %] 96 % (08/15 0400) FiO2 (%):  [21 %] 21 % (08/15 1620) Weight:  [71.6 kg] 71.6 kg (08/15 0501)  CONSTITUTIONAL: Awake, chronically ill appearing, in no distress and alert and oriented to person and place PULMONARY/CHEST WALL: On trach collar HENT: Head : normocephalic and atraumatic Nose: nose normal and no purulence Mouth/Throat:  Mouth: dry oral mucosa EYES: conjunctiva normal, EOM normal and PERRL NECK: supple, Shiley cuffed trach in place, patent, secured with sutures and trach tie. No bleeding around trach.  PROCEDURE: TRACH CHANGE Sutures of existing trach cut and removed. Trach removed, new cuffless Shiley 6-0 Prox XLT placed using obturator, disposable inner cannula secured. Correct placement confirmed with tracheoscopy. Trach re-secured with new Posey ties. PMV placed, trach collar re-secured.   Recent Labs    07/06/21 0240 07/07/21 1128  NA 139 139  K 4.8 4.3  CL 106 105  CO2 23 26  GLUCOSE 226* 144*  BUN 34* 26*  CREATININE 0.94 0.79  CALCIUM 9.0 8.8*   Lab Results  Component Value Date   WBC 13.3 (H) 07/07/2021   HGB 12.6 (L) 07/07/2021   HCT 39.4 07/07/2021   MCV 94.3 07/07/2021   PLT 142 (L) 07/07/2021      Specimen Description TRACHEAL ASPIRATE   Special Requests  NONE   Gram Stain RARE WBC PRESENT, PREDOMINANTLY PMN  FEW GRAM POSITIVE COCCI IN CLUSTERS  RARE GRAM NEGATIVE RODS  RARE GRAM POSITIVE RODS   Culture CULTURE REINCUBATED FOR BETTER GROWTH  Performed at [facility], [place] [address], [place], [place] [address]   Report Status PENDING      Medications: I have reviewed the patient's current medications.  New Imaging:  MBS 07/07/2021:   Delayed swallow initiation-vallecula;Delayed swallow initiation-pyriform sinuses;Reduced pharyngeal peristalsis;Reduced epiglottic inversion;Reduced anterior laryngeal mobility;Reduced laryngeal elevation;Reduced airway/laryngeal closure;Penetration/Aspiration before swallow;Penetration/Aspiration during swallow;Penetration/Apiration after swallow;Moderate aspiration;Pharyngeal residue - valleculae;Pharyngeal residue - pyriform  Pharyngeal Material enters airway, passes BELOW cords and not ejected out despite cough attempt by patient   EXAM: PORTABLE CHEST 1 VIEW 07/06/2021   COMPARISON:  July 01, 2021   FINDINGS: The feeding tube terminates below today's film. A tracheostomy tube projects over the tracheal air column. No pneumothorax. The cardiomediastinal silhouette is unremarkable. Stable cardiomegaly. Small pleural effusion versus pleural thickening on the left. No focal pulmonary infiltrates identified.   IMPRESSION: 1. Support apparatus as above. 2. Small left pleural effusion versus pleural thickening. 3. No other abnormalities.    Pathology:  SURGICAL PATHOLOGY  CASE: [id number]   FINAL MICROSCOPIC DIAGNOSIS:   A. 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   Assessment/Plan: Bertran Zeimet is a 87 y/o M POD #5 s/p microlaryngoscopy and tracheostomy with Shiley 6 proximal XLT trach. Final pathology positive for SCC, clinically staged  [id number] -Trach changed to Shiley 6 Prox XLT cuffless without issue, placement confirmed with  bedside tracheoscopy -Continue routine trach care -PMV as tolerated -Patient's pathology reviewed with patient and his family at bedside. Patient expressed understanding. We will review his case at [facility] Board this Wednesday. He will need outpatient follow up with radiation oncology. -Patient noted to have laryngeal penetration and aspiration on repeat MBS today; SLP recommends continued n.p.o.  If swallow function does not improve, patient will likely need alternative means of nutrition. Consider PEG placement. -Outpatient follow up with trach clinic for trach management -Medical management as per primary team.  -Recommend social work consultation to determine needs for discharge. Patient and his wife live at home alone, and wife is debilitated.    LOS: 6 days   [facility] 07/07/2021, 4:50 PM

## 2021-07-07 NOTE — Progress Notes (Signed)
  Speech Language Pathology Treatment: Nada Boozer Speaking valve  Patient Details Name: Christopher Santiago MRN: [id number] DOB: [birth year] Today's Date: 07/07/2021 Time: 6834-1962 SLP Time Calculation (min) (ACUTE ONLY): 15 min  Assessment / Plan / Recommendation Clinical Impression  Daughter, Christopher Santiago, at bedside, and willing to participate in education re: PMV use and placement.  We reviewed the function of PMV, looked through pamphlet with images re: airflow with and without trach, with and without PMV.  This clinician demonstrated placement and removal of valve and reviewed parameters under which valve should be removed.  Christopher Santiago demonstrated placement/removal and verbalized understanding.  D/W RN that Christopher Santiago may supervise her step-father with PMV. Valve should be removed when pt is left alone.    During session, pt tolerated valve with improved vocal quality, VS remained stable; he oxygenated well.  He is able to cough up secretions orally.  SLP will follow.    HPI HPI: Pt is an 87 y/o male admitted 8/10 secondary to worsening SOB, especially with exertion. Found to have acute respiratory failure with hypoxia. Imaging showed fullness of the right aryepiglottic fold extending into the right pyriform and L renal mass. Pt had flexible laryngoscopy on 8/10, including trach.  Findings revealed bilateral true vocal folds in paramedian position with normal adduction, restricted abduction causing narrowing of the airway. No evidence of airway obstruction. Mucosal abnormality/ edema of right supraglottis/glottis noted with no exophytic mass lesion, biopsy showed squamous cell carcinoma.  PMH includes vocal cord cancer ~ 30 years ago, COPD, CAD, DM, HTN, R THA, bilateral hearing aids.      SLP Plan  Continue with current plan of care       Recommendations                   Follow up Recommendations: Outpatient SLP;Home health SLP SLP Visit Diagnosis: Aphonia (R49.1) Plan: Continue with current  plan of care       [place]. Tivis Ringer, [place] Office number [phone] Pager [phone]   Assunta Curtis 07/07/2021, 4:31 PM

## 2021-07-07 NOTE — Progress Notes (Signed)
Physical Therapy Treatment Patient Details Name: Christopher Santiago MRN: [id number] DOB: 1931-11-01 Today's Date: 07/07/2021    History of Present Illness Pt is an 87 y/o male admitted secondary to worsening SOB, especially with exertion. Found to have acute respiratory failure with hypoxia. Imaging showed fullness of the right aryepiglottic fold extending into the right pyriform and L renal mass. Pt had Flexible laryngoscopy on 8/10. Trach in place. Workup pending. PMH includes vocal cord cancer, COPD, CAD, DM, HTN, and R THA.    PT Comments    Pt admitted with above diagnosis. Pt was able to stand at EOB and neded to be cleaned of BM.  Pt needs assist to steady in standing. REcommend CIR as feel that pt could progress well and return home after CIR.   Pt currently with functional limitations due to balance and endurance deficits. Pt will benefit from skilled PT to increase their independence and safety with mobility to allow discharge to the venue listed below.      Follow Up Recommendations  Supervision for mobility/OOB;CIR     Equipment Recommendations  Other (comment) (TBD pending progression)    Recommendations for Other Services Rehab consult     Precautions / Restrictions Precautions Precautions: Fall Precaution Comments: Watch O2. Restrictions Weight Bearing Restrictions: No    Mobility  Bed Mobility Overal bed mobility: Needs Assistance Bed Mobility: Rolling;Sidelying to Sit Rolling: Min assist Sidelying to sit: Min assist Supine to sit: Min assist Sit to supine: Supervision   General bed mobility comments: vc and assist for rolling and side to sit    Transfers Overall transfer level: Needs assistance Equipment used: 1 person hand held assist Transfers: Sit to/from Stand Sit to Stand: Min assist         General transfer comment: min assist to steady upon standing. Pt with BM and PT assisted pt with cleaning.  Assisted back to bed at end of  treatment.  Ambulation/Gait                 Stairs             Wheelchair Mobility    Modified Rankin (Stroke Patients Only)       Balance Overall balance assessment: Needs assistance Sitting-balance support: No upper extremity supported;Feet supported Sitting balance-Leahy Scale: Fair     Standing balance support: Single extremity supported Standing balance-Leahy Scale: Poor Standing balance comment: Reliant on UE and external support                            Cognition Arousal/Alertness: Awake/alert Behavior During Therapy: WFL for tasks assessed/performed Overall Cognitive Status: Difficult to assess                                 General Comments: Patient oriented to first name only this date. Disoriented to place, time and situation. Unable to recall name of current president. States "I don't have a wife" when asked if his wife is able to bathe/dress herself. Follows 1-step verbal commands with good accuracy. Disorientation may be secondary to this therapist awaking patient for evaluation.      Exercises General Exercises - Upper Extremity Shoulder Flexion: Both;5 reps;Supine;AROM Shoulder Extension: Both;5 reps;Supine;AROM Elbow Extension: AROM;Both;5 reps;Supine Wrist Flexion: AROM;Both;5 reps;Supine Wrist Extension: AROM;Both;5 reps;Supine Digit Composite Flexion: AROM;Both;5 reps;Supine Composite Extension: AROM;Both;5 reps;Supine General Exercises - Lower Extremity Ankle Circles/Pumps: AROM;Both;10 reps;Supine Long  Arc Quad: AROM;Both;10 reps;Seated    General Comments General comments (skin integrity, edema, etc.): VSS on trach collar.  Nurse made aware that pts condom cath came off and she came to replace at end of session.      Pertinent Vitals/Pain      Home Living                      Prior Function            PT Goals (current goals can now be found in the care plan section) Acute Rehab PT  Goals Patient Stated Goal: No goals stated. Progress towards PT goals: Progressing toward goals    Frequency    Min 3X/week      PT Plan Other (comment) (continue to assess as able to mobilize more)    Co-evaluation              AM-PAC PT "6 Clicks" Mobility   Outcome Measure  Help needed turning from your back to your side while in a flat bed without using bedrails?: None Help needed moving from lying on your back to sitting on the side of a flat bed without using bedrails?: A Little Help needed moving to and from a bed to a chair (including a wheelchair)?: A Little Help needed standing up from a chair using your arms (e.g., wheelchair or bedside chair)?: A Little Help needed to walk in hospital room?: A Little Help needed climbing 3-5 steps with a railing? : A Lot 6 Click Score: 18    End of Session Equipment Utilized During Treatment: Gait belt;Oxygen Activity Tolerance: Patient limited by fatigue Patient left: with call bell/phone within reach;with family/visitor present;in bed;with bed alarm set Nurse Communication: Mobility status PT Visit Diagnosis: Unsteadiness on feet (R26.81);Difficulty in walking, not elsewhere classified (R26.2)     Time: 3358-2518 PT Time Calculation (min) (ACUTE ONLY): 14 min  Charges:  $Therapeutic Activity: 8-22 mins                     Christopher Santiago,PT Acute Rehab Services [phone] (pager)    Christopher Santiago 07/07/2021, 1:57 PM

## 2021-07-08 LAB — BASIC METABOLIC PANEL
Anion gap: 3 — ABNORMAL LOW (ref 5–15)
BUN: 26 mg/dL — ABNORMAL HIGH (ref 8–23)
CO2: 29 mmol/L (ref 22–32)
Calcium: 8.9 mg/dL (ref 8.9–10.3)
Chloride: 104 mmol/L (ref 98–111)
Creatinine, Ser: 0.78 mg/dL (ref 0.61–1.24)
GFR, Estimated: >60 mL/min (ref >60)
Glucose, Bld: 150 mg/dL — ABNORMAL HIGH (ref 70–99)
Potassium: 5.1 mmol/L (ref 3.5–5.1)
Sodium: 136 mmol/L (ref 135–145)

## 2021-07-08 LAB — CBC
HCT: 38 % — ABNORMAL LOW (ref 39.0–52.0)
Hemoglobin: 11.8 g/dL — ABNORMAL LOW (ref 13.0–17.0)
MCH: 29.7 pg (ref 26.0–34.0)
MCHC: 31.1 g/dL (ref 30.0–36.0)
MCV: 95.7 fL (ref 80.0–100.0)
Platelets: 133 10*3/uL — ABNORMAL LOW (ref 150–400)
RBC: 3.97 MIL/uL — ABNORMAL LOW (ref 4.22–5.81)
RDW: 13.9 % (ref 11.5–15.5)
WBC: 12.1 10*3/uL — ABNORMAL HIGH (ref 4.0–10.5)
nRBC: 0 % (ref 0.0–0.2)

## 2021-07-08 LAB — GLUCOSE, CAPILLARY
Glucose-Capillary: 121 mg/dL — ABNORMAL HIGH (ref 70–99)
Glucose-Capillary: 134 mg/dL — ABNORMAL HIGH (ref 70–99)
Glucose-Capillary: 144 mg/dL — ABNORMAL HIGH (ref 70–99)
Glucose-Capillary: 146 mg/dL — ABNORMAL HIGH (ref 70–99)
Glucose-Capillary: 160 mg/dL — ABNORMAL HIGH (ref 70–99)
Glucose-Capillary: 175 mg/dL — ABNORMAL HIGH (ref 70–99)

## 2021-07-08 LAB — CULTURE, RESPIRATORY W GRAM STAIN: Culture: NORMAL

## 2021-07-08 NOTE — Care Management Important Message (Signed)
Important Message  Patient Details  Name: Christopher Santiago MRN: [id number] Date of Birth: 10-20-1931   Medicare Important Message Given:  Yes     [name] [name] 07/08/2021, 4:17 PM
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
## 2021-07-08 NOTE — Progress Notes (Signed)
OT Cancellation Note  Patient Details Name: Christopher Santiago MRN: [id number] DOB: [birth year]   Cancelled Treatment:    Reason Eval/Treat Not Completed: Patient/family declined. Daughter present at bedside reports patient had an eventful day yesterday with request to rest today. Patient/family declined bed level, EOB and OOB activity. OT to attempt treatment session 8/17.   Gloris Manchester OTR/L Supplemental OT, Department of rehab services [phone]  Janalyn Higby R H. 07/08/2021, 2:40 PM

## 2021-07-09 ENCOUNTER — Encounter: Payer: Self-pay | Admitting: Radiation Oncology

## 2021-07-09 DIAGNOSIS — C321 Malignant neoplasm of supraglottis: Secondary | ICD-10-CM | POA: Insufficient documentation

## 2021-07-09 LAB — BASIC METABOLIC PANEL
Anion gap: 6 (ref 5–15)
BUN: 24 mg/dL — ABNORMAL HIGH (ref 8–23)
CO2: 27 mmol/L (ref 22–32)
Calcium: 9 mg/dL (ref 8.9–10.3)
Chloride: 101 mmol/L (ref 98–111)
Creatinine, Ser: 0.81 mg/dL (ref 0.61–1.24)
GFR, Estimated: >60 mL/min (ref >60)
Glucose, Bld: 125 mg/dL — ABNORMAL HIGH (ref 70–99)
Potassium: 4.6 mmol/L (ref 3.5–5.1)
Sodium: 134 mmol/L — ABNORMAL LOW (ref 135–145)

## 2021-07-09 LAB — CBC
HCT: 39.2 % (ref 39.0–52.0)
Hemoglobin: 12.2 g/dL — ABNORMAL LOW (ref 13.0–17.0)
MCH: 29.7 pg (ref 26.0–34.0)
MCHC: 31.1 g/dL (ref 30.0–36.0)
MCV: 95.4 fL (ref 80.0–100.0)
Platelets: 131 10*3/uL — ABNORMAL LOW (ref 150–400)
RBC: 4.11 MIL/uL — ABNORMAL LOW (ref 4.22–5.81)
RDW: 13.6 % (ref 11.5–15.5)
WBC: 14.3 10*3/uL — ABNORMAL HIGH (ref 4.0–10.5)
nRBC: 0 % (ref 0.0–0.2)

## 2021-07-09 LAB — GLUCOSE, CAPILLARY
Glucose-Capillary: 123 mg/dL — ABNORMAL HIGH (ref 70–99)
Glucose-Capillary: 205 mg/dL — ABNORMAL HIGH (ref 70–99)
Glucose-Capillary: 96 mg/dL (ref 70–99)
Glucose-Capillary: 86 mg/dL (ref 70–99)
Glucose-Capillary: 148 mg/dL — ABNORMAL HIGH (ref 70–99)

## 2021-07-09 MED ORDER — TRAMADOL HCL 50 MG PO TABS
25.0000 mg | ORAL_TABLET | Freq: Four times a day (QID) | ORAL | Status: DC | PRN
  Administered 2021-07-10: 25 mg via ORAL
  Filled 2021-07-09 (×2): qty 1

## 2021-07-09 MED ORDER — ALLOPURINOL 100 MG PO TABS
100.0000 mg | ORAL_TABLET | Freq: Every day | ORAL | Status: DC
  Administered 2021-07-09 – 2021-08-12 (×31): 100 mg via ORAL
  Filled 2021-07-09 (×31): qty 1

## 2021-07-09 NOTE — Progress Notes (Signed)
PROGRESS NOTE  Christopher Santiago FTD:[id number] DOB: 1931/06/10 DOA: 07/01/2021 PCP: Tonia Ghent, MD   LOS: 8 days   Brief Narrative / Interim history: 87 year old with past medical history significant for COPD, quit smoking 30 years ago, vocal cord cancer, diabetes who presents complaining of exertional dyspnea, reports sinusitis, postnasal drip, hoarseness for the past 3 to 4 months. He was also complaining of difficulty swallowing, food gets stuck in the throat, he also report phlegm, in his throat. CT maxillofacial, soft tissue neck: Asymmetric soft tissue density/fullness at the base of the right aryepiglottic fold, extending into the right piriform sinus with possible involvement of the right glottis inferiorly.  If echo to measure or define a discrete mass within this region given lack of IV contrast. ENT consulted for direct visualization.  Patient underwent  laryngoscopy with biopsy and tracheostomy 8/10.   Subjective / 24h Interval events: Confused this morning, alert  Assessment & Plan: Principal Problem Acute Hypoxic Respiratory Failure -Suspect related to component of aspiration due to dysphagia larynx CA, COPD exacerbation -He has completed 5 days of IV steroids, wheezing is improved -Currently on antibiotics with Vanco and cefepime, respiratory culture with normal respiratory flora, narrowed to cefepime alone, continue, still has significant secretions, increased WBC -Continue Mucomyst 3 times daily -ENT managing trach care  Active Problems Squamous cell carcinoma Larynx-CT: With asymmetric soft tissue density fullness at the base of the right aryepiglottic fold: ENT consulted.  Patient underwent laryngoscopy with biopsy and tracheostomy 8/11. Stable on ATC 5 L oxygen.  -Biopsy showed squamous cell carcinoma with foci suspicious for invasion.  -He was discussed at tumor board, will have outpatient follow-up with medical oncology, radiation oncology -Trach changed to Shiley 6  Prox XLT Cuffless.    1.2 cm spiculated right upper lobe nodule-outpatient follow up   3-Left Renal mass -Concerning for renal carcinoma, will need to follow-up with urology as an outpatient   Diabetes mellitus - Not on medication. Continue SSI  HTN -Resume metoprolol, Continue with Norvasc.    Leukocytosis probably related to steroids vs infection  Anxiety-low dose benzo.    Nutrition-Continue with tube feeding.  Consult IR for PEG tube  Scheduled Meds:  acetaminophen  500 mg Oral TID   amLODipine  5 mg Oral Daily   arformoterol  15 mcg Nebulization BID   budesonide (PULMICORT) nebulizer solution  0.25 mg Nebulization BID   chlorhexidine gluconate (MEDLINE KIT)  15 mL Mouth Rinse BID   Chlorhexidine Gluconate Cloth  6 each Topical Daily   feeding supplement (PROSource TF)  45 mL Per Tube BID   guaiFENesin  10 mL Oral Q8H   insulin aspart  0-5 Units Subcutaneous QHS   insulin aspart  0-9 Units Subcutaneous TID WC   lidocaine  1 application Urethral Once   mouth rinse  15 mL Mouth Rinse 10 times per day   metoprolol tartrate  12.5 mg Oral Daily   nystatin  5 mL Oral QID   revefenacin  175 mcg Nebulization Daily   sennosides  5 mL Oral BID   sodium chloride flush  10-40 mL Intracatheter Q12H   Continuous Infusions:  ceFEPime (MAXIPIME) IV 2 g (07/09/21 0905)   feeding supplement (OSMOLITE 1.5 CAL) 1,000 mL (07/08/21 1657)   PRN Meds:.albuterol, bisacodyl, hydrALAZINE, lip balm, LORazepam, sodium chloride flush, traMADol  Diet Orders (From admission, onward)     Start     Ordered   07/03/21 1433  Diet NPO time specified Except for: [employer]  Diet effective now       Comments: Occasional ice chips after oral care  Question:  Except for  Answer:  Ice Chips   07/03/21 1432            DVT prophylaxis: Place and maintain sequential compression device Start: 07/04/21 1027     Code Status: Full Code  Family Communication: No family at bedside  Status is:  Inpatient  Remains inpatient appropriate because:IV treatments appropriate due to intensity of illness or inability to take PO  Dispo: The patient is from: Home              Anticipated d/c is to: Home              Patient currently is not medically stable to d/c.   Difficult to place patient No  Level of care: Progressive  Consultants:  ENT  Procedures:  Laryngoscopy / trach  Microbiology  none  Antimicrobials: Cefepime    Objective: Vitals:   07/08/21 1933 07/09/21 0300 07/09/21 0744 07/09/21 0825  BP:    (!) 112/53  Pulse:  85 77 77  Resp:  (!) 22 (!) 22 15  Temp:    97.8 F (36.6 C)  TempSrc:    Axillary  SpO2: 98% 97% 96% 97%  Weight:      Height:        Intake/Output Summary (Last 24 hours) at 07/09/2021 0929 Last data filed at 07/09/2021 0854 Gross per 24 hour  Intake 366.33 ml  Output 1400 ml  Net -1033.67 ml   Filed Weights   07/05/21 0320 07/07/21 0501 07/08/21 0505  Weight: 70.8 kg 71.6 kg 71.8 kg    Examination:  Constitutional: NAD Eyes: no scleral icterus ENMT: Mucous membranes are moist.  Neck: normal, supple Respiratory: Coarse breath sounds bilaterally, faint rhonchi.  No wheezing Cardiovascular: Regular rate and rhythm, no murmurs / rubs / gallops. No LE edema. Abdomen: non distended, no tenderness. Bowel sounds positive.  Musculoskeletal: no clubbing / cyanosis.  Skin: no rashes Neurologic: CN 2-12 grossly intact. Strength 5/5 in all 4.   Data Reviewed: I have independently reviewed following labs and imaging studies   CBC: Recent Labs  Lab 07/03/21 0507 07/04/21 0336 07/05/21 0538 07/06/21 0240 07/07/21 1128 07/08/21 1143 07/09/21 0312  WBC 17.7*   < > 12.6* 12.4* 13.3* 12.1* 14.3*  NEUTROABS 15.8*  --   --   --   --   --   --   HGB 11.4*   < > 11.7* 11.7* 12.6* 11.8* 12.2*  HCT 36.3*   < > 37.9* 37.0* 39.4 38.0* 39.2  MCV 98.1   < > 98.4 96.6 94.3 95.7 95.4  PLT 171   < > 167 147* 142* 133* 131*   < > = values in this  interval not displayed.   Basic Metabolic Panel: Recent Labs  Lab 07/03/21 0507 07/04/21 0336 07/05/21 0538 07/05/21 1646 07/06/21 0240 07/07/21 1128 07/08/21 1258 07/09/21 0312  NA 141 141 141  --  139 139 136 134*  K 4.1 4.0 4.1  --  4.8 4.3 5.1 4.6  CL 103 105 107  --  106 105 104 101  CO2 _0 --  _1 GLUCOSE 167* 119* 220*  --  226* 144* 150* 125*  BUN 28* 22 35*  --  34* 26* 26* 24*  CREATININE 1.18 0.92 1.10  --  0.94 0.79 0.78 0.81  CALCIUM 9.2 9.1 9.1  --  9.0 8.8* 8.9 9.0  MG 2.6* 2.5* 2.7* 2.6*  --   --   --   --   PHOS  --  3.4 2.7 2.4* 2.5  --   --   --    Liver Function Tests: No results for input(s): AST, ALT, ALKPHOS, BILITOT, PROT, ALBUMIN in the last 168 hours. Coagulation Profile: No results for input(s): INR, PROTIME in the last 168 hours. HbA1C: No results for input(s): HGBA1C in the last 72 hours. CBG: Recent Labs  Lab 07/08/21 1224 07/08/21 1633 07/08/21 1948 07/09/21 0428 07/09/21 0746  GLUCAP 160* 175* 146* 123* 205*    Recent Results (from the past 240 hour(s))  Resp Panel by RT-PCR (Flu A&B, Covid) Nasopharyngeal Swab     Status: None   Collection Time: 07/01/21  5:39 PM   Specimen: Nasopharyngeal Swab; Nasopharyngeal(NP) swabs in vial transport medium  Result Value Ref Range Status   SARS Coronavirus 2 by RT PCR NEGATIVE NEGATIVE Final    Comment: (NOTE) SARS-CoV-2 target nucleic acids are NOT DETECTED.  The SARS-CoV-2 RNA is generally detectable in upper respiratory specimens during the acute phase of infection. The lowest concentration of SARS-CoV-2 viral copies this assay can detect is 138 copies/mL. A negative result does not preclude SARS-Cov-2 infection and should not be used as the sole basis for treatment or other patient management decisions. A negative result may occur with  improper specimen collection/handling, submission of specimen other than nasopharyngeal swab, presence of viral mutation(s) within  the areas targeted by this assay, and inadequate number of viral copies(<138 copies/mL). A negative result must be combined with clinical observations, patient history, and epidemiological information. The expected result is Negative.  Fact Sheet for Patients:  EntrepreneurPulse.com.au  Fact Sheet for Healthcare Providers:  IncredibleEmployment.be  This test is no t yet approved or cleared by the [place] FDA and  has been authorized for detection and/or diagnosis of SARS-CoV-2 by FDA under an Emergency Use Authorization (EUA). This EUA will remain  in effect (meaning this test can be used) for the duration of the COVID-19 declaration under Section 564(b)(1) of the Act, 21 U.S.C.section 360bbb-3(b)(1), unless the authorization is terminated  or revoked sooner.       Influenza A by PCR NEGATIVE NEGATIVE Final   Influenza B by PCR NEGATIVE NEGATIVE Final    Comment: (NOTE) The Xpert Xpress SARS-CoV-2/FLU/RSV plus assay is intended as an aid in the diagnosis of influenza from Nasopharyngeal swab specimens and should not be used as a sole basis for treatment. Nasal washings and aspirates are unacceptable for Xpert Xpress SARS-CoV-2/FLU/RSV testing.  Fact Sheet for Patients: EntrepreneurPulse.com.au  Fact Sheet for Healthcare Providers: IncredibleEmployment.be  This test is not yet approved or cleared by the [place] FDA and has been authorized for detection and/or diagnosis of SARS-CoV-2 by FDA under an Emergency Use Authorization (EUA). This EUA will remain in effect (meaning this test can be used) for the duration of the COVID-19 declaration under Section 564(b)(1) of the Act, 21 U.S.C. section 360bbb-3(b)(1), unless the authorization is terminated or revoked.  Performed at [facility], [place] [address], [place], [place] [address]   MRSA Next Gen by PCR, Nasal     Status: None    Collection Time: 07/02/21  6:26 PM   Specimen: Nasal Mucosa; Nasal Swab  Result Value Ref Range Status   MRSA by PCR Next Gen NOT DETECTED NOT DETECTED Final    Comment: (NOTE) The GeneXpert MRSA Assay (FDA approved  for NASAL specimens only), is one component of a comprehensive MRSA colonization surveillance program. It is not intended to diagnose MRSA infection nor to guide or monitor treatment for MRSA infections. Test performance is not FDA approved in patients less than 61 years old. Performed at [facility], [place] [address] [place], [place] [address]   Culture, Respiratory w Gram Stain     Status: None   Collection Time: 07/06/21  2:17 PM   Specimen: Tracheal Aspirate; Respiratory  Result Value Ref Range Status   Specimen Description TRACHEAL ASPIRATE  Final   Special Requests NONE  Final   Gram Stain   Final    RARE WBC PRESENT, PREDOMINANTLY PMN FEW GRAM POSITIVE COCCI IN CLUSTERS RARE GRAM NEGATIVE RODS RARE GRAM POSITIVE RODS    Culture   Final    FEW Normal respiratory flora-no Staph aureus or Pseudomonas seen Performed at [facility], [address], [place], [place] [address]    Report Status 07/08/2021 FINAL  Final     Radiology Studies: No results found.    Marzetta Board, MD, PhD [facility]  Between 7 am - 7 pm I am available, please contact me via Amion (for emergencies) or Securechat (non urgent messages)  Between 7 pm - 7 am I am not available, please contact night coverage MD/APP via Amion

## 2021-07-09 NOTE — Progress Notes (Signed)
? ?  Inpatient Rehab Admissions Coordinator : ? ?Per therapy recommendations, patient was screened for CIR candidacy by Tamim Skog RN MSN.  At this time patient appears to be a potential candidate for CIR. I will place a rehab consult per protocol for full assessment. Please call me with any questions. ? ?Kenadi Miltner RN MSN ?Admissions Coordinator ?[phone] ?  ?

## 2021-07-09 NOTE — Progress Notes (Signed)
Physical Therapy Treatment Patient Details Name: GERMAINE SHENKER MRN: [id number] DOB: 03-07-1931 Today's Date: 07/09/2021    History of Present Illness Pt is an 87 y/o male admitted secondary to worsening SOB, especially with exertion. Found to have acute respiratory failure with hypoxia. Imaging showed fullness of the right aryepiglottic fold extending into the right pyriform and L renal mass. Pt had Flexible laryngoscopy on 8/10. Trach in place. Workup pending. PMH includes vocal cord cancer, COPD, CAD, DM, HTN, and R THA.    PT Comments    Pt admitted with above diagnosis. Pt was more lethargic today than in previous session. Pt also in greater pain in joints with MD notified by this PT. Pt needed +2 assist as well to transfer to chair.  Pt currently with functional limitations due to balance and endurance deficits. Pt will benefit from skilled PT to increase their independence and safety with mobility to allow discharge to the venue listed below.      Follow Up Recommendations  Supervision for mobility/OOB;CIR     Equipment Recommendations  Other (comment) (TBD pending progression)    Recommendations for Other Services Rehab consult     Precautions / Restrictions Precautions Precautions: Fall Precaution Comments: Watch O2. Restrictions Weight Bearing Restrictions: No    Mobility  Bed Mobility Overal bed mobility: Needs Assistance Bed Mobility: Rolling;Sidelying to Sit Rolling: Min assist Sidelying to sit: Min assist Supine to sit: Min assist     General bed mobility comments: vc and assist for rolling and side to sit.    Transfers Overall transfer level: Needs assistance Equipment used: Rolling walker (2 wheeled) Transfers: Sit to/from [facility] Sit to Stand: Max assist;+2 physical assistance;+2 safety/equipment Stand pivot transfers: Mod assist;+2 physical assistance;+2 safety/equipment       General transfer comment: Max A +2 for sit to stand  from slightly elevated EOB; assist for walker management.  Mod A +2 for stand-pivot to recliner with +2 external assist for weight shifting and foot placement with PT having to physically initiate left LE movement and then therapists assisting with each weight shift to take pivotal steps to chair. .  Ambulation/Gait                 Stairs             Wheelchair Mobility    Modified Rankin (Stroke Patients Only)       Balance Overall balance assessment: Needs assistance Sitting-balance support: No upper extremity supported;Feet supported Sitting balance-Leahy Scale: Poor Sitting balance - Comments: Min guard to Min A to maintain static sitting balance at EOB. Postural control: Other (comment) (anterior lean) Standing balance support: Bilateral upper extremity supported;During functional activity Standing balance-Leahy Scale: Poor Standing balance comment: Reliant on +2 assist to maintain static standing balance                            Cognition Arousal/Alertness: Lethargic Behavior During Therapy: WFL for tasks assessed/performed Overall Cognitive Status: Difficult to assess                                 General Comments: Follows 1-step verbal commands intermittently. Lethargic.      Exercises General Exercises - Upper Extremity Shoulder Flexion: Both;5 reps;Supine;AAROM Shoulder Extension: Both;5 reps;Supine;AAROM Elbow Extension: Both;5 reps;Supine;AAROM Wrist Flexion: Both;5 reps;Supine;AAROM Wrist Extension: Both;5 reps;Supine;AAROM General Exercises - Lower Extremity Ankle Circles/Pumps: AROM;Both;10  reps;Supine Heel Slides: AROM;Both;5 reps;Supine    General Comments General comments (skin integrity, edema, etc.): 28% trach collar with VS.  Messaged MD regarding wifes comments that pt has had gout PTA and is not currently on gout meds - pt c/o joijnt pain throughout today. Wife also states pt does not do well with certain  meds and that MD has told her in past his liver doesnt process them well.      Pertinent Vitals/Pain Pain Assessment: Faces Faces Pain Scale: Hurts even more Pain Location: BUE and BLE with touching Pain Descriptors / Indicators: Aching;Sore Pain Intervention(s): Limited activity within patient's tolerance;Monitored during session;Repositioned    Home Living                      Prior Function            PT Goals (current goals can now be found in the care plan section) Acute Rehab PT Goals Patient Stated Goal: Per wife, for patient to get better. Progress towards PT goals: Progressing toward goals    Frequency    Min 3X/week      PT Plan Other (comment) (continue to assess as able to mobilize more)    Co-evaluation PT/OT/SLP Co-Evaluation/Treatment: Yes Reason for Co-Treatment: Complexity of the patient's impairments (multi-system involvement);For patient/therapist safety PT goals addressed during session: Mobility/safety with mobility OT goals addressed during session: ADL's and self-care;Strengthening/ROM      AM-PAC PT "6 Clicks" Mobility   Outcome Measure  Help needed turning from your back to your side while in a flat bed without using bedrails?: A Lot Help needed moving from lying on your back to sitting on the side of a flat bed without using bedrails?: A Lot Help needed moving to and from a bed to a chair (including a wheelchair)?: Total Help needed standing up from a chair using your arms (e.g., wheelchair or bedside chair)?: Total Help needed to walk in hospital room?: Total Help needed climbing 3-5 steps with a railing? : Total 6 Click Score: 8    End of Session Equipment Utilized During Treatment: Gait belt;Oxygen Activity Tolerance: Patient limited by fatigue;Patient limited by pain Patient left: with call bell/phone within reach;with family/visitor present;in chair;with chair alarm set Nurse Communication: Mobility status PT Visit  Diagnosis: Unsteadiness on feet (R26.81);Difficulty in walking, not elsewhere classified (R26.2)     Time: 1157-2620 PT Time Calculation (min) (ACUTE ONLY): 32 min  Charges:  $Therapeutic Activity: 8-22 mins                     Anistyn Graddy M,PT Acute Rehab Services [phone] (pager)    Alvira Philips 07/09/2021, 1:59 PM

## 2021-07-09 NOTE — Progress Notes (Signed)
Occupational Therapy Co-Treatment w/ PT Patient Details Name: Christopher Santiago MRN: [id number] DOB: 03/20/1931 Today's Date: 07/09/2021    History of present illness Pt is an 87 y/o male admitted secondary to worsening SOB, especially with exertion. Found to have acute respiratory failure with hypoxia. Imaging showed fullness of the right aryepiglottic fold extending into the right pyriform and L renal mass. Pt had Flexible laryngoscopy on 8/10. Trach in place. Workup pending. PMH includes vocal cord cancer, COPD, CAD, DM, HTN, and R THA.   OT comments  Patient met lying supine in bed. Wife present at bedside. Focus of session on BUE HEP. Noted patient with facial grimacing with touching of BUE/BLE particularly at the joints. RN notified. Wife reports patient has gout at baseline and stopped taking prescription medication shortly prior to admission. Patient tolerated 1 set x5 reps each of AAROM BUE HEP. Min A for supine to EOB and +2 assist for sit to stand and stand-pivot to recliner with assist for weight shifting and walker management. Patient with significant decline in function since initial evaluation. Recommendation updated to CIR. OT will continue to follow acutely.    Follow Up Recommendations  CIR    Equipment Recommendations  Other (comment) (Defer to next level of care.)    Recommendations for Other Services Rehab consult    Precautions / Restrictions Precautions Precautions: Fall Precaution Comments: Watch O2. Restrictions Weight Bearing Restrictions: No       Mobility Bed Mobility Overal bed mobility: Needs Assistance Bed Mobility: Rolling;Sidelying to Sit Rolling: Min assist Sidelying to sit: Min assist       General bed mobility comments: vc and assist for rolling and side to sit.    Transfers Overall transfer level: Needs assistance Equipment used: Rolling walker (2 wheeled) Transfers: Sit to/from [facility] Sit to Stand: Max assist;+2  physical assistance;+2 safety/equipment Stand pivot transfers: Mod assist;+2 physical assistance;+2 safety/equipment       General transfer comment: Max A +2 for sit to stand from slightly elevated EOB; assist for walker management.  Mod A +2 for stand-pivot to recliner with +2 external assist for weight shifting and foot placement.    Balance Overall balance assessment: Needs assistance Sitting-balance support: No upper extremity supported;Feet supported Sitting balance-Leahy Scale: Poor Sitting balance - Comments: Min guard to Min A to maintain static sitting balance at EOB. Postural control: Other (comment) (anterior lean) Standing balance support: Single extremity supported Standing balance-Leahy Scale: Poor Standing balance comment: Reliant on +2 assist to maintain static standing balance                           ADL either performed or assessed with clinical judgement   ADL Overall ADL's : Needs assistance/impaired     Grooming: Moderate assistance Grooming Details (indicate cue type and reason): Face washing seated EOB.             Lower Body Dressing: Maximal assistance;Bed level Lower Body Dressing Details (indicate cue type and reason): Max A to don footwear in supine. Toilet Transfer: Moderate assistance;+2 for physical assistance;+2 for safety/equipment;RW Toilet Transfer Details (indicate cue type and reason): Patient with tremor like movements this date. Mod to Max A +2 for functional transfers. Requires increased time/effort.           General ADL Comments: Patient greatly limited by generalized weakness/debility, decreased balance and decreased cardiopulmonary status.     Vision       Perception  Praxis      Cognition Arousal/Alertness: Lethargic Behavior During Therapy: WFL for tasks assessed/performed Overall Cognitive Status: Difficult to assess                                 General Comments: Follows 1-step  verbal commands intermittently. Lethargic.        Exercises Exercises: General Upper Extremity General Exercises - Upper Extremity Shoulder Flexion: Both;5 reps;Supine;AAROM Shoulder Extension: Both;5 reps;Supine;AAROM Elbow Extension: Both;5 reps;Supine;AAROM Wrist Flexion: Both;5 reps;Supine;AAROM Wrist Extension: Both;5 reps;Supine;AAROM   Shoulder Instructions       General Comments O2 via trach collar, FiO2 28%. Wife present at bedside.    Pertinent Vitals/ Pain       Pain Assessment: Faces Faces Pain Scale: Hurts even more Pain Location: BUE and BLE with touching Pain Descriptors / Indicators: Aching;Sore Pain Intervention(s): Limited activity within patient's tolerance;Monitored during session;Repositioned  Home Living                                          Prior Functioning/Environment              Frequency  Min 2X/week        Progress Toward Goals  OT Goals(current goals can now be found in the care plan section)  Progress towards OT goals: Not progressing toward goals - comment (Session limited by pain. Patient with limited participation.)  Acute Rehab OT Goals Patient Stated Goal: Per wife, for patient to get better. OT Goal Formulation: With patient/family Time For Goal Achievement: 07/17/21 Potential to Achieve Goals: Fair ADL Goals Pt Will Perform Eating: Independently Pt Will Perform Grooming: with modified independence;standing Pt Will Perform Upper Body Dressing: Independently Pt Will Perform Lower Body Dressing: with modified independence;sit to/from stand Pt Will Transfer to Toilet: with modified independence;ambulating Pt Will Perform Toileting - Clothing Manipulation and hygiene: with modified independence;sit to/from stand Pt Will Perform Tub/Shower Transfer: ambulating;rolling walker;Tub transfer;Shower transfer Pt/caregiver will Perform Home Exercise Program: Increased ROM;Increased strength;Both right and left  upper extremity  Plan Discharge plan needs to be updated;Frequency remains appropriate    Co-evaluation    PT/OT/SLP Co-Evaluation/Treatment: Yes Reason for Co-Treatment: Complexity of the patient's impairments (multi-system involvement)   OT goals addressed during session: ADL's and self-care;Strengthening/ROM      AM-PAC OT "6 Clicks" Daily Activity     Outcome Measure   Help from another person eating meals?: Total Help from another person taking care of personal grooming?: A Lot Help from another person toileting, which includes using toliet, bedpan, or urinal?: Total Help from another person bathing (including washing, rinsing, drying)?: A Lot Help from another person to put on and taking off regular upper body clothing?: A Lot Help from another person to put on and taking off regular lower body clothing?: A Lot 6 Click Score: 10    End of Session Equipment Utilized During Treatment: Gait belt;Oxygen;Other (comment) (via trach collar)  OT Visit Diagnosis: Unsteadiness on feet (R26.81);Muscle weakness (generalized) (M62.81)   Activity Tolerance Patient limited by lethargy;Patient limited by pain   Patient Left in chair;with call bell/phone within reach;with chair alarm set   Nurse Communication Mobility status;Other (comment) (Tremor like movements of all 4 extremities. RN notified.)        Time: 2952-8413 OT Time Calculation (min): 28 min  Charges: OT General  Charges $OT Visit: 1 Visit OT Treatments $Therapeutic Activity: 8-22 mins  Garren Greenman H. OTR/L Supplemental OT, Department of rehab services [phone]   Lakita Sahlin R H. 07/09/2021, 10:57 AM

## 2021-07-09 NOTE — Evaluation (Signed)
Patient requiring more frequent rounding to address excessive production of tenacious secretions. Mittens applied to deter patient from removing IV line, trach, trach collar and telemetry equipment. Ineffective. On-call notified of the inability to clear secretions with in-line suctioning. New orders acknowledged as appropriate.

## 2021-07-09 NOTE — Progress Notes (Signed)
IR was requested for image guided G tube placement.   Case was reviewed by Dr. Vernard Gambles, there is a risk of colon interposition. Patient needs barium two days prior to the placement and will need to be re-evaluated under fluoro. Patient also being treated for possible aspiration PNA, has been on IV abx and patient is afebrile but WBC trended up today.  Will wait WBC normalizes before proceed with the G tube placement.   PLAN - will monitor labs and wait till WBC normalizes before proceed with G tube placement.  - once WBC normalizes, patient will need barium through the feeding tube two days prior to the G tube placement   Ordering provider notified via secure chat.    Please call IR for questions and concerns.   Armando Gang Zaylin Runco PA-C 07/09/2021 11:20 AM

## 2021-07-10 ENCOUNTER — Inpatient Hospital Stay (HOSPITAL_COMMUNITY): Payer: Medicare Other

## 2021-07-10 LAB — GLUCOSE, CAPILLARY
Glucose-Capillary: 146 mg/dL — ABNORMAL HIGH (ref 70–99)
Glucose-Capillary: 111 mg/dL — ABNORMAL HIGH (ref 70–99)
Glucose-Capillary: 156 mg/dL — ABNORMAL HIGH (ref 70–99)

## 2021-07-10 MED ORDER — SCOPOLAMINE 1 MG/3DAYS TD PT72
1.0000 | MEDICATED_PATCH | TRANSDERMAL | Status: DC
  Administered 2021-07-10 – 2021-08-09 (×11): 1.5 mg via TRANSDERMAL
  Filled 2021-07-10 (×12): qty 1

## 2021-07-10 MED ORDER — SODIUM CHLORIDE 0.9 % IV SOLN
2.0000 g | Freq: Three times a day (TID) | INTRAVENOUS | Status: DC
  Administered 2021-07-10 – 2021-07-12 (×6): 2 g via INTRAVENOUS
  Filled 2021-07-10 (×6): qty 2

## 2021-07-10 MED ORDER — IOHEXOL 300 MG/ML  SOLN
75.0000 mL | Freq: Once | INTRAMUSCULAR | Status: AC | PRN
  Administered 2021-07-10: 75 mL via INTRAVENOUS

## 2021-07-10 MED ORDER — FREE WATER
150.0000 mL | Status: DC
  Administered 2021-07-10 – 2021-08-08 (×151): 150 mL

## 2021-07-10 NOTE — Progress Notes (Signed)
Responded to consult for IV. IV tubing found with blood in line and unable to flush. On removal, unable to flush tubing due to clotted blood in line. Instructions provided to RN for flushing IV.

## 2021-07-10 NOTE — Progress Notes (Signed)
Central telemetry reported pt had 20 runs of VT.  RN was in the room,  pt is in his sleep.  Will continue to monitor

## 2021-07-10 NOTE — Progress Notes (Signed)
Pharmacy Antibiotic Note  Christopher Santiago is a 87 y.o. male with respiratory failure, possible PNA.  Pharmacy was consulted  on 8/154/22 for Vancomycin and Cefepime dosing.   He has completed 5 days of IV steroids, MD noted wheezing is improved.  Respiratory culture with normal respiratory flora.  MRSA PCR was negative. Antibiotics narrowed to cefepime alone. Continues on Cefepime and MD noted patient still has significant secretions. Plans to continue cefepime x3 more days. SCr down to 0.81 , estimated CrCl is 56.9 ml/min.  For CrCk ~ 56.9 ml/min,  I will increase Cefepime dose to 2g IV q8h  Plan: Increase Cefepime to 2g IV q8hr  Monitor clinical status, renal function and culture results daily.    Height: 5\' 6"  (167.6 cm) Weight: 71.8 kg (158 lb 4.6 oz) IBW/kg (Calculated) : 63.8  Temp (24hrs), Avg:98.3 F (36.8 C), Min:97.9 F (36.6 C), Max:98.7 F (37.1 C)  Recent Labs  Lab 07/05/21 0538 07/06/21 0240 07/07/21 1128 07/08/21 1143 07/08/21 1258 07/09/21 0312  WBC 12.6* 12.4* 13.3* 12.1*  --  14.3*  CREATININE 1.10 0.94 0.79  --  0.78 0.81     Estimated Creatinine Clearance: 56.9 mL/min (by C-G formula based on SCr of 0.81 mg/dL).    Allergies  Allergen Reactions   Tessalon [Benzonatate] Other (See Comments)    Ineffective, nasal congestion    Antibiotics:  CTX 8/10>>8/15  Vancomycin 8/15>>8/17 Cefepime 8/15>>   Microbiology:  8/14: TA: normal flora, no SA, PSA 8/10 MRSA PCR  >> negative 8/9 covid neg, flu neg  Nicole Cella, [place] Pharmacist [phone] 07/10/2021 1:07 PM Please check AMION for all [facility] phone numbers After 10:00 PM, call [place]

## 2021-07-10 NOTE — Progress Notes (Signed)
SLP Cancellation Note  Patient Details Name: Christopher Santiago MRN: [id number] DOB: [birth year]   Cancelled treatment:       Reason Eval/Treat Not Completed: Patient at procedure or test/unavailable (pt heading to [place] will follow up as scheduling allows for PMSV intervention and dysphagia intervention)   [place], CCC-SLP Acute Rehabilitation Services   07/10/2021, 9:56 AM

## 2021-07-10 NOTE — Progress Notes (Signed)
IP rehab admissions - I spoke with patient's wife by phone.  Wife is interested in SNF placement and prefers [facility] which is close to their home.  Wife is disabled and cannot walk or care for patient after a potential rehab stay.  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  Wife also has hired caregivers at home.  Daughter works.  Wife is in agreement that SNF placement will be needed at the time of discharge.  Call for questions.  [phone]

## 2021-07-10 NOTE — Progress Notes (Signed)
Nutrition Follow-up  DOCUMENTATION CODES:  Severe malnutrition in context of chronic illness  INTERVENTION:  Continue TF via Cortrak: -Osmolite 1.5 cal @ 55 ml/hr (1320 ml/day) -ProSource TF 45 ml BID -132m free water flushes Q4H   Provides 2060 kcal, 105 grams of protein, and 1006 ml of H2O (1906 ml free water total with flushes)  NUTRITION DIAGNOSIS:  Severe Malnutrition related to chronic illness (COPD) as evidenced by severe fat depletion, severe muscle depletion -- ongoing  GOAL:  Patient will meet greater than or equal to 90% of their needs -- met with TF  MONITOR:  Diet advancement, Labs, Weight trends, Skin, I & O's  REASON FOR ASSESSMENT:  Consult Assessment of nutrition requirement/status  ASSESSMENT:  87year old male who presented to the ED on 8/09 with SOB and dysphagia. PMH of COPD, CAD, depression, DM, HTN, HLD, laryngeal cancer treated ~30 years ago, OSA.  8/10 - s/p laryngeal biopsy and tracheostomy 8/12 - Cortrak placed, tip of tube in stomach 8/15 - trach exchanged to #6 XLT-P uncuffed  Pt continues on trach collar and NPO status. Receiving TF via Cortrak. Current TF orders: Osmolite 1.5 @ 559mhr & 4528mrosource TF BID. IR has been consulted to place PEG; tentatively planned for next week. Recommend continue current nutrition plan of care.   Medications: SSI, Senokot, IV abx Labs: Na 134 (L) CBGs 86-205 over 24 hours  UOP: 600m62m4 hours I/O: +6L since admit  Admission weight: 70.8 kg Current weight (last updated 07/08/21): 71.8 kg   Diet Order:   Diet Order             Diet NPO time specified Except for: Ice Chips  Diet effective now                  EDUCATION NEEDS:  No education needs have been identified at this time  Skin:  Skin Assessment: Reviewed RN Assessment Skin Integrity Issues:: Incisions Incisions: neck s/p trach  Last BM:  8/16  Height:  Ht Readings from Last 1 Encounters:  07/02/21 _0  (1.676 m)   Weight:   Wt Readings from Last 1 Encounters:  07/08/21 71.8 kg   BMI:  Body mass index is 25.55 kg/m.  Estimated Nutritional Needs:  Kcal:  2000-2200 kcal Protein:  95-115 grams Fluid:  >/= 2 L/day   AmanLarkin Ina, RD, LDN (she/her/hers) RD pager number and weekend/on-call pager number located in [place]

## 2021-07-10 NOTE — Progress Notes (Signed)
PROGRESS NOTE  Christopher Santiago GQQ:[id number] DOB: 01/29/1931 DOA: 07/01/2021 PCP: Tonia Ghent, MD   LOS: 9 days   Brief Narrative / Interim history: 87 year old with past medical history significant for COPD, quit smoking 30 years ago, vocal cord cancer, diabetes who presents complaining of exertional dyspnea, reports sinusitis, postnasal drip, hoarseness for the past 3 to 4 months. He was also complaining of difficulty swallowing, food gets stuck in the throat, he also report phlegm, in his throat. CT maxillofacial, soft tissue neck: Asymmetric soft tissue density/fullness at the base of the right aryepiglottic fold, extending into the right piriform sinus with possible involvement of the right glottis inferiorly.  If echo to measure or define a discrete mass within this region given lack of IV contrast. ENT consulted for direct visualization.  Patient underwent  laryngoscopy with biopsy and tracheostomy 8/10.   Subjective / 24h Interval events: Alert, increased secretions, coughing up more  Assessment & Plan: Principal Problem Acute Hypoxic Respiratory Failure -Suspect related to component of aspiration due to dysphagia larynx CA, COPD exacerbation -He has completed 5 days of IV steroids, wheezing is improved -Currently on antibiotics with Vanco and cefepime, respiratory culture with normal respiratory flora, narrowed to cefepime alone, continue, still has significant secretions, today is day 4 of cefepime and has received Ceftriaxone prior. Would favor 3 more days of cefepime  -Continue Mucomyst 3 times daily -ENT managing trach care -Chest PT  Active Problems Squamous cell carcinoma Larynx-CT: With asymmetric soft tissue density fullness at the base of the right aryepiglottic fold: ENT consulted.  Patient underwent laryngoscopy with biopsy and tracheostomy 8/11. Stable on ATC 5 L oxygen.  -Biopsy showed squamous cell carcinoma with foci suspicious for invasion.  -He was discussed at  tumor board, will have outpatient follow-up with medical oncology, radiation oncology.  CT scan of the neck, chest pending -Trach changed to Shiley 6 Prox XLT Cuffless.    1.2 cm spiculated right upper lobe nodule-outpatient follow up   3-Left Renal mass -Concerning for renal carcinoma, will need to follow-up with urology as an outpatient   Diabetes mellitus - Not on medication. Continue SSI  HTN -Resume metoprolol, Continue with Norvasc.    Leukocytosis probably related to steroids vs infection. Continue to monitor CBC  Anxiety-low dose benzo.    Nutrition-Continue with tube feeding.  Consult IR for PEG tube. Awaiting WBC to normalize then barrium administration 2 days prior to PEG tube placement so anticipate next week  Scheduled Meds:  acetaminophen  500 mg Oral TID   allopurinol  100 mg Oral Daily   amLODipine  5 mg Oral Daily   arformoterol  15 mcg Nebulization BID   budesonide (PULMICORT) nebulizer solution  0.25 mg Nebulization BID   chlorhexidine gluconate (MEDLINE KIT)  15 mL Mouth Rinse BID   Chlorhexidine Gluconate Cloth  6 each Topical Daily   feeding supplement (PROSource TF)  45 mL Per Tube BID   guaiFENesin  10 mL Oral Q8H   insulin aspart  0-5 Units Subcutaneous QHS   insulin aspart  0-9 Units Subcutaneous TID WC   lidocaine  1 application Urethral Once   mouth rinse  15 mL Mouth Rinse 10 times per day   metoprolol tartrate  12.5 mg Oral Daily   nystatin  5 mL Oral QID   revefenacin  175 mcg Nebulization Daily   sennosides  5 mL Oral BID   sodium chloride flush  10-40 mL Intracatheter Q12H   Continuous Infusions:  ceFEPime (  MAXIPIME) IV 2 g (07/09/21 2304)   feeding supplement (OSMOLITE 1.5 CAL) 1,000 mL (07/09/21 1521)   PRN Meds:.albuterol, bisacodyl, hydrALAZINE, iohexol, lip balm, sodium chloride flush, traMADol  Diet Orders (From admission, onward)     Start     Ordered   07/03/21 1433  Diet NPO time specified Except for: Ice Chips  Diet effective  now       Comments: Occasional ice chips after oral care  Question:  Except for  Answer:  Ice Chips   07/03/21 1432            DVT prophylaxis: Place and maintain sequential compression device Start: 07/04/21 1027     Code Status: Full Code  Family Communication: wife at bedside   Status is: Inpatient  Remains inpatient appropriate because:IV treatments appropriate due to intensity of illness or inability to take PO  Dispo: The patient is from: Home              Anticipated d/c is to: Home              Patient currently is not medically stable to d/c.   Difficult to place patient No  Level of care: Progressive  Consultants:  ENT  Procedures:  Laryngoscopy / trach  Microbiology  none  Antimicrobials: Cefepime    Objective: Vitals:   07/09/21 1637 07/09/21 2004 07/09/21 2020 07/10/21 0753  BP: (!) 146/53 (!) 165/56  (!) 117/40  Pulse: 70   69  Resp: (!) 24   (!) 22  Temp:  97.9 F (36.6 C)    TempSrc:  Oral    SpO2: 97%  100% 97%  Weight:      Height:        Intake/Output Summary (Last 24 hours) at 07/10/2021 1030 Last data filed at 07/10/2021 0600 Gross per 24 hour  Intake 5442.33 ml  Output 1600 ml  Net 3842.33 ml    Filed Weights   07/05/21 0320 07/07/21 0501 07/08/21 0505  Weight: 70.8 kg 71.6 kg 71.8 kg    Examination:  Constitutional: NAD Eyes: no scleral icterus ENMT: mmm Neck: normal, supple Respiratory: Coarse breath sounds bilaterally, no wheezing Cardiovascular: Regular rate and rhythm, no murmurs, no peripheral edema Abdomen: Soft, NT, ND, bowel sounds positive Musculoskeletal: no clubbing / cyanosis.  Skin: No new rashes Neurologic: No focal deficits  Data Reviewed: I have independently reviewed following labs and imaging studies   CBC: Recent Labs  Lab 07/05/21 0538 07/06/21 0240 07/07/21 1128 07/08/21 1143 07/09/21 0312  WBC 12.6* 12.4* 13.3* 12.1* 14.3*  HGB 11.7* 11.7* 12.6* 11.8* 12.2*  HCT 37.9* 37.0* 39.4  38.0* 39.2  MCV 98.4 96.6 94.3 95.7 95.4  PLT 167 147* 142* 133* 131*    Basic Metabolic Panel: Recent Labs  Lab 07/04/21 0336 07/05/21 0538 07/05/21 1646 07/06/21 0240 07/07/21 1128 07/08/21 1258 07/09/21 0312  NA 141 141  --  139 139 136 134*  K 4.0 4.1  --  4.8 4.3 5.1 4.6  CL 105 107  --  106 105 104 101  CO2 27 26  --  _0 GLUCOSE 119* 220*  --  226* 144* 150* 125*  BUN 22 35*  --  34* 26* 26* 24*  CREATININE 0.92 1.10  --  0.94 0.79 0.78 0.81  CALCIUM 9.1 9.1  --  9.0 8.8* 8.9 9.0  MG 2.5* 2.7* 2.6*  --   --   --   --   PHOS 3.4  2.7 2.4* 2.5  --   --   --     Liver Function Tests: No results for input(s): AST, ALT, ALKPHOS, BILITOT, PROT, ALBUMIN in the last 168 hours. Coagulation Profile: No results for input(s): INR, PROTIME in the last 168 hours. HbA1C: No results for input(s): HGBA1C in the last 72 hours. CBG: Recent Labs  Lab 07/09/21 0746 07/09/21 1223 07/09/21 1653 07/09/21 2044 07/10/21 0759  GLUCAP 205* 96 86 148* 146*     Recent Results (from the past 240 hour(s))  Resp Panel by RT-PCR (Flu A&B, Covid) Nasopharyngeal Swab     Status: None   Collection Time: 07/01/21  5:39 PM   Specimen: Nasopharyngeal Swab; Nasopharyngeal(NP) swabs in vial transport medium  Result Value Ref Range Status   SARS Coronavirus 2 by RT PCR NEGATIVE NEGATIVE Final    Comment: (NOTE) SARS-CoV-2 target nucleic acids are NOT DETECTED.  The SARS-CoV-2 RNA is generally detectable in upper respiratory specimens during the acute phase of infection. The lowest concentration of SARS-CoV-2 viral copies this assay can detect is 138 copies/mL. A negative result does not preclude SARS-Cov-2 infection and should not be used as the sole basis for treatment or other patient management decisions. A negative result may occur with  improper specimen collection/handling, submission of specimen other than nasopharyngeal swab, presence of viral mutation(s) within the areas  targeted by this assay, and inadequate number of viral copies(<138 copies/mL). A negative result must be combined with clinical observations, patient history, and epidemiological information. The expected result is Negative.  Fact Sheet for Patients:  EntrepreneurPulse.com.au  Fact Sheet for Healthcare Providers:  IncredibleEmployment.be  This test is no t yet approved or cleared by the [place] FDA and  has been authorized for detection and/or diagnosis of SARS-CoV-2 by FDA under an Emergency Use Authorization (EUA). This EUA will remain  in effect (meaning this test can be used) for the duration of the COVID-19 declaration under Section 564(b)(1) of the Act, 21 U.S.C.section 360bbb-3(b)(1), unless the authorization is terminated  or revoked sooner.       Influenza A by PCR NEGATIVE NEGATIVE Final   Influenza B by PCR NEGATIVE NEGATIVE Final    Comment: (NOTE) The Xpert Xpress SARS-CoV-2/FLU/RSV plus assay is intended as an aid in the diagnosis of influenza from Nasopharyngeal swab specimens and should not be used as a sole basis for treatment. Nasal washings and aspirates are unacceptable for Xpert Xpress SARS-CoV-2/FLU/RSV testing.  Fact Sheet for Patients: EntrepreneurPulse.com.au  Fact Sheet for Healthcare Providers: IncredibleEmployment.be  This test is not yet approved or cleared by the [place] FDA and has been authorized for detection and/or diagnosis of SARS-CoV-2 by FDA under an Emergency Use Authorization (EUA). This EUA will remain in effect (meaning this test can be used) for the duration of the COVID-19 declaration under Section 564(b)(1) of the Act, 21 U.S.C. section 360bbb-3(b)(1), unless the authorization is terminated or revoked.  Performed at [facility], [place] [address] [place], [place] [address]   MRSA Next Gen by PCR, Nasal     Status: None   Collection Time:  07/02/21  6:26 PM   Specimen: Nasal Mucosa; Nasal Swab  Result Value Ref Range Status   MRSA by PCR Next Gen NOT DETECTED NOT DETECTED Final    Comment: (NOTE) The GeneXpert MRSA Assay (FDA approved for NASAL specimens only), is one component of a comprehensive MRSA colonization surveillance program. It is not intended to diagnose MRSA infection nor to guide or  monitor treatment for MRSA infections. Test performance is not FDA approved in patients less than 53 years old. Performed at [facility], [place] [address] [place], [place] [address]   Culture, Respiratory w Gram Stain     Status: None   Collection Time: 07/06/21  2:17 PM   Specimen: Tracheal Aspirate; Respiratory  Result Value Ref Range Status   Specimen Description TRACHEAL ASPIRATE  Final   Special Requests NONE  Final   Gram Stain   Final    RARE WBC PRESENT, PREDOMINANTLY PMN FEW GRAM POSITIVE COCCI IN CLUSTERS RARE GRAM NEGATIVE RODS RARE GRAM POSITIVE RODS    Culture   Final    FEW Normal respiratory flora-no Staph aureus or Pseudomonas seen Performed at [facility], [address] [place], [place] [address]    Report Status 07/08/2021 FINAL  Final      Radiology Studies: No results found.    Marzetta Board, MD, PhD [facility]  Between 7 am - 7 pm I am available, please contact me via Amion (for emergencies) or Securechat (non urgent messages)  Between 7 pm - 7 am I am not available, please contact night coverage MD/APP via Amion

## 2021-07-10 NOTE — Progress Notes (Signed)
  Speech Language Pathology Treatment:    Patient Details Name: Christopher Santiago MRN: [id number] DOB: June 29, 1931 Today's Date: 07/10/2021 Time: 1125-1140 SLP Time Calculation (min) (ACUTE ONLY): 15 min  Assessment / Plan / Recommendation Clinical Impression  Followed up for continued PMSV, speech intervention and dysphagia management. Of note, pt pending PEG placement per radiology consult note. Pt was resting prior to SLP arrival, arouse easily. Repositioned fully upright. Nursing provided tracheal suction. Secretions continued to be moderate (yellow in nature). Per RN pt is requiring frequent tracheal suction. Placed PMSV, pt with stable vitals, slight increase noted in RR, (18-24) however pt did not exhibit any changes in clinical presentation. Pt was able to attain some phonation with cues from SLP for phonation upon exhalation. He would frequently default to nonverbal communcation (per chart review some increased confusion has been exhibited). Pt able to communicate at the word level with hoarseness, dysphonia, and reduced vocal intensity exhibited, all impairing communication intelligilbility. He utilized PMSV for approximately 12 minutes. PMSV removed. Continue use with staff only and full supervision as tolerated. Given pts decompensated medical status and increased secretions, PO ice chips were withheld. Will continue to follow.      HPI HPI: Pt is an 87 y/o male admitted 8/10 secondary to worsening SOB, especially with exertion. Found to have acute respiratory failure with hypoxia. Imaging showed fullness of the right aryepiglottic fold extending into the right pyriform and L renal mass. Pt had flexible laryngoscopy on 8/10, including trach.  Findings revealed bilateral true vocal folds in paramedian position with normal adduction, restricted abduction causing narrowing of the airway. No evidence of airway obstruction. Mucosal abnormality/ edema of right supraglottis/glottis noted with no  exophytic mass lesion, biopsy showed squamous cell carcinoma.  PMH includes vocal cord cancer ~ 30 years ago, COPD, CAD, DM, HTN, R THA, bilateral hearing aids.      SLP Plan     Patient needs continued Speech Lanaguage Pathology Services    Recommendations         Patient may use Passy-Muir Speech Valve: with SLP only PMSV Supervision: Full MD: Please consider changing trach tube to : Smaller size         Follow up Recommendations: Outpatient SLP;Home health SLP SLP Visit Diagnosis: Aphonia (R49.1)       [place], CCC-SLP Acute Rehabilitation Services   07/10/2021, 11:52 AM

## 2021-07-11 ENCOUNTER — Inpatient Hospital Stay (HOSPITAL_COMMUNITY): Payer: Medicare Other

## 2021-07-11 DIAGNOSIS — Z515 Encounter for palliative care: Secondary | ICD-10-CM

## 2021-07-11 DIAGNOSIS — C329 Malignant neoplasm of larynx, unspecified: Secondary | ICD-10-CM

## 2021-07-11 DIAGNOSIS — Z7189 Other specified counseling: Secondary | ICD-10-CM

## 2021-07-11 LAB — CBC
HCT: 35.1 % — ABNORMAL LOW (ref 39.0–52.0)
Hemoglobin: 11.1 g/dL — ABNORMAL LOW (ref 13.0–17.0)
MCH: 29.7 pg (ref 26.0–34.0)
MCHC: 31.6 g/dL (ref 30.0–36.0)
MCV: 93.9 fL (ref 80.0–100.0)
Platelets: 156 10*3/uL (ref 150–400)
RBC: 3.74 MIL/uL — ABNORMAL LOW (ref 4.22–5.81)
RDW: 13.5 % (ref 11.5–15.5)
WBC: 10.1 10*3/uL (ref 4.0–10.5)
nRBC: 0 % (ref 0.0–0.2)

## 2021-07-11 LAB — GLUCOSE, CAPILLARY
Glucose-Capillary: 109 mg/dL — ABNORMAL HIGH (ref 70–99)
Glucose-Capillary: 150 mg/dL — ABNORMAL HIGH (ref 70–99)
Glucose-Capillary: 152 mg/dL — ABNORMAL HIGH (ref 70–99)
Glucose-Capillary: 156 mg/dL — ABNORMAL HIGH (ref 70–99)
Glucose-Capillary: 156 mg/dL — ABNORMAL HIGH (ref 70–99)
Glucose-Capillary: 161 mg/dL — ABNORMAL HIGH (ref 70–99)

## 2021-07-11 LAB — COMPREHENSIVE METABOLIC PANEL
ALT: 14 U/L (ref 0–44)
AST: 13 U/L — ABNORMAL LOW (ref 15–41)
Albumin: 2.4 g/dL — ABNORMAL LOW (ref 3.5–5.0)
Alkaline Phosphatase: 56 U/L (ref 38–126)
Anion gap: 7 (ref 5–15)
BUN: 26 mg/dL — ABNORMAL HIGH (ref 8–23)
CO2: 26 mmol/L (ref 22–32)
Calcium: 9.1 mg/dL (ref 8.9–10.3)
Chloride: 102 mmol/L (ref 98–111)
Creatinine, Ser: 0.83 mg/dL (ref 0.61–1.24)
GFR, Estimated: >60 mL/min (ref >60)
Glucose, Bld: 159 mg/dL — ABNORMAL HIGH (ref 70–99)
Potassium: 4.9 mmol/L (ref 3.5–5.1)
Sodium: 135 mmol/L (ref 135–145)
Total Bilirubin: 0.3 mg/dL (ref 0.3–1.2)
Total Protein: 6.1 g/dL — ABNORMAL LOW (ref 6.5–8.1)

## 2021-07-11 MED ORDER — ACETAMINOPHEN 500 MG PO TABS
500.0000 mg | ORAL_TABLET | Freq: Three times a day (TID) | ORAL | Status: DC
  Administered 2021-07-11 – 2021-08-12 (×83): 500 mg
  Filled 2021-07-11 (×83): qty 1

## 2021-07-11 MED ORDER — GLYCOPYRROLATE 1 MG PO TABS: 1.0000 mg | ORAL_TABLET | Freq: Two times a day (BID) | ORAL | Status: DC

## 2021-07-11 MED ORDER — GLYCOPYRROLATE 0.2 MG/ML IJ SOLN
0.2000 mg | Freq: Three times a day (TID) | INTRAMUSCULAR | Status: DC | PRN
  Administered 2021-07-11 – 2021-07-12 (×2): 0.2 mg via INTRAVENOUS
  Filled 2021-07-11 (×2): qty 1

## 2021-07-11 NOTE — Procedures (Signed)
Cortrak  Person Inserting Tube:  Maylon Peppers C, RD Tube Type:  Cortrak - 43 inches Tube Size:  10 Tube Location:  Right nare Initial Placement:  Stomach Secured by: Bridle Technique Used to Measure Tube Placement:  Marking at nare/corner of mouth Cortrak Secured At:  70 cm  Cortrak Tube Team Note:  RN reported to [employer] team that pt had pulled out his feeding tube and the RN had removed his bridle. Asked to replaced tube.   X-ray is required, abdominal x-ray has been ordered by the Cortrak team. Please confirm tube placement before using the Cortrak tube.   If the tube becomes dislodged please keep the tube and contact the Cortrak team at [email or web address] (password [email or web address]) for replacement.  If after hours and replacement cannot be delayed, place a NG tube and confirm placement with an abdominal x-ray.    Lockie Pares., RD, LDN, CNSC See AMiON for contact information

## 2021-07-11 NOTE — Progress Notes (Signed)
Physical Therapy Treatment Patient Details Name: Christopher Santiago MRN: [id number] DOB: 05/29/1931 Today's Date: 07/11/2021    History of Present Illness Pt is an 87 y/o male admitted secondary to worsening SOB, especially with exertion. Found to have acute respiratory failure with hypoxia. Imaging showed fullness of the right aryepiglottic fold extending into the right pyriform and L renal mass. Pt had Flexible laryngoscopy on 8/10. Trach in place. Workup pending. PMH includes vocal cord cancer, COPD, CAD, DM, HTN, and R THA.    PT Comments    Pt admitted with above diagnosis. Pt was able to ambulate a few steps today with RW with min assist of 2 however was limited as he had BM once he took a few steps and it took incr time to clean the floor and pt fatigued as he stood to be cleaned. Pt was much more alert and talkative today as well with great improvement in ability to participate. Noted that wife wants pt closer to home at a SNF therefore updated d/c plan and frequency to reflect this.  Pt currently with functional limitations due to balance and endurance deficits. Pt will benefit from skilled PT to increase their independence and safety with mobility to allow discharge to the venue listed below.      Follow Up Recommendations  SNF     Equipment Recommendations  Other (comment) (TBD pending progression)    Recommendations for Other Services       Precautions / Restrictions Precautions Precautions: Fall Precaution Comments: Watch O2. Restrictions Weight Bearing Restrictions: No    Mobility  Bed Mobility Overal bed mobility: Needs Assistance Bed Mobility: Rolling;Sidelying to Sit Rolling: Min assist Sidelying to sit: Min assist Supine to sit: Min assist     General bed mobility comments: Needed a little assist for trunk but was able to mvoe LEs off bed.Cleaned pt prior to getting up as he had a little BM on arrival.    Transfers Overall transfer level: Needs  assistance Equipment used: Rolling walker (2 wheeled) Transfers: Sit to/from Stand Sit to Stand: +2 physical assistance;Min assist         General transfer comment: Min assist of 2 with assist for lines.  Improved stability, however, constant cueing/reminders for hand placement.  Ambulation/Gait Ambulation/Gait assistance: Min assist;+2 safety/equipment Gait Distance (Feet): 5 Feet Assistive device: Rolling walker (2 wheeled) Gait Pattern/deviations: Step-through pattern;Decreased stride length;Trunk flexed;Wide base of support Gait velocity: Decreased Gait velocity interpretation: <1.31 ft/sec, indicative of household ambulator General Gait Details: Mild unsteadiness noted when taking steps at EOB. Pt took a few steps forward and began having BM and unable to get 3n1 in time.  Pt stood about 5 min for PT and tech to get washclothes and clean floor and pt eventually bringing the chair to the pt. Pt min guard to stand with RW while being cleaned.  Pt was on 28% trach collar therefore removed O2 and pt sats 89% and greater during session.  OT arrived while PT cleaning pt and OT continued her session at the end of the PT session.   Stairs             Wheelchair Mobility    Modified Rankin (Stroke Patients Only)       Balance Overall balance assessment: Needs assistance Sitting-balance support: No upper extremity supported;Feet supported Sitting balance-Leahy Scale: Fair Sitting balance - Comments: Min guard to maintain static sitting balance at EOB. Postural control: Other (comment) (anterior lean) Standing balance support: Bilateral upper extremity supported;During  functional activity Standing balance-Leahy Scale: Poor Standing balance comment: Reliant on +1 min guard assist to maintain static standing balance                            Cognition Arousal/Alertness: Awake/alert Behavior During Therapy: WFL for tasks assessed/performed Overall Cognitive Status:  Difficult to assess                                 General Comments: increased alertness for today's session with PT following into OT.      Exercises      General Comments        Pertinent Vitals/Pain Pain Assessment: Faces Faces Pain Scale: Hurts a little bit Pain Location: throat region Pain Descriptors / Indicators: Aching;Sore Pain Intervention(s): Limited activity within patient's tolerance;Monitored during session;Repositioned    Home Living                      Prior Function            PT Goals (current goals can now be found in the care plan section) Acute Rehab PT Goals Patient Stated Goal: Per wife, for patient to get better. Progress towards PT goals: Progressing toward goals    Frequency    Min 2X/week      PT Plan Discharge plan needs to be updated;Frequency needs to be updated    Co-evaluation PT/OT/SLP Co-Evaluation/Treatment: Yes Reason for Co-Treatment: For patient/therapist safety PT goals addressed during session: Mobility/safety with mobility        AM-PAC PT "6 Clicks" Mobility   Outcome Measure  Help needed turning from your back to your side while in a flat bed without using bedrails?: A Lot Help needed moving from lying on your back to sitting on the side of a flat bed without using bedrails?: A Lot Help needed moving to and from a bed to a chair (including a wheelchair)?: Total Help needed standing up from a chair using your arms (e.g., wheelchair or bedside chair)?: Total Help needed to walk in hospital room?: Total Help needed climbing 3-5 steps with a railing? : Total 6 Click Score: 8    End of Session Equipment Utilized During Treatment: Gait belt;Oxygen Activity Tolerance: Patient tolerated treatment well Patient left: with call bell/phone within reach;with family/visitor present;in chair;with chair alarm set Nurse Communication: Mobility status PT Visit Diagnosis: Unsteadiness on feet  (R26.81);Difficulty in walking, not elsewhere classified (R26.2)     Time: 1131-1200 PT Time Calculation (min) (ACUTE ONLY): 29 min  Charges:  $Gait Training: 8-22 mins                     Ashanty Coltrane M,PT Acute Rehab Services [phone] (pager)    Alvira Philips 07/11/2021, 4:02 PM

## 2021-07-11 NOTE — Consult Note (Signed)
Consultation Note Date: 07/11/2021   Patient Name: Christopher Santiago  DOB: 03-May-1931  MRN: [id number]  Age / Sex: 87 y.o., male  PCP: Tonia Ghent, MD Referring Physician: Caren Griffins, MD  Reason for Consultation: goals of care  HPI/Patient Profile: 87 y.o. male  with past medical history of COPD, vocal cord cancer, DM admitted on 07/01/2021 with complaints of exertional dyspnea, sinusitis, hoarseness for 3 months. 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 He is s/p trach on 8/10. PEG placement is pending, IR has been consulted. Palliative medicine consulted for goals of care.     Primary Decision Maker NEXT OF KIN  Discussion: Chart reviewed- noted that prior to admission patient was living at home and was the primary caretaker for his spouse. He also has a daughter who has been present and involved in his care.  Oncology was consulted and his case was reviewed at tumor board with recommendations for outpatient f/u with medical oncology and radiation oncology.  TOC has spoken with spouse and she is requesting placement at [facility].  There is a primary care note indicating that patient does have advance directives- but they are not on file. I attempted to speak with patient regarding goals of care and advance directives.  Communication was difficult due to patient's hard of hearing status and his recent trach placement. He was also very tired.  He stated he just wanted to rest.  I attempted to call his daughter for further discussion. Message left.   SUMMARY OF RECOMMENDATIONS -Continue current care -PMT will await return call from daughter and attempt to arrange a meeting with her present to support patient and will request copy of his advance directives    Code Status/Advance Care Planning: Full code   Prognosis:   Unable to determine  Discharge Planning: [facility] for rehab with Palliative care service follow-up  Primary Diagnoses: Present on Admission:  Acute respiratory failure with hypoxia ([place])  COPD with acute exacerbation ([place])  Essential hypertension  Left renal mass  Respiratory failure, acute ([place])   Review of Systems  Physical Exam  Vital Signs: BP (!) 128/48   Pulse 70   Temp 97.9 F (36.6 C) (Oral)   Resp 14   Ht 5\' 6"  (1.676 m)   Wt 70.6 kg   SpO2 95%   BMI 25.12 kg/m  Pain Scale: 0-10   Pain Score: 0-No pain   SpO2: SpO2: 95 % O2 Device:SpO2: 95 % O2 Flow Rate: .O2 Flow Rate (L/min): 5 L/min  IO: Intake/output summary:  Intake/Output Summary (Last 24 hours) at 07/11/2021 1412 Last data filed at 07/11/2021 0746 Gross per 24 hour  Intake 2740 ml  Output 1700 ml  Net 1040 ml    LBM: Last BM Date: 07/09/21 Baseline Weight: Weight: 77.1 kg Most recent weight: Weight: 70.6 kg     Palliative Assessment/Data: PPS: 60%     Thank you for this consult. Palliative medicine will continue to follow and assist as needed.  Time In: 1330 Time Out: 1421 Time Total: 51 mins Greater than 50%  of this time was spent counseling and coordinating care related to the above assessment and plan.  Signed by: Mariana Kaufman, AGNP-C Palliative Medicine    Please contact Palliative Medicine Team phone at [phone] for questions and concerns.  For individual provider: See Shea Evans

## 2021-07-11 NOTE — Progress Notes (Signed)
PROGRESS NOTE  Christopher Santiago OLM:[id number] DOB: 1931-08-23 DOA: 07/01/2021 PCP: Tonia Ghent, MD   LOS: 10 days   Brief Narrative / Interim history: 87 year old with past medical history significant for COPD, quit smoking 30 years ago, vocal cord cancer, diabetes who presents complaining of exertional dyspnea, reports sinusitis, postnasal drip, hoarseness for the past 3 to 4 months. He was also complaining of difficulty swallowing, food gets stuck in the throat, he also report phlegm, in his throat. CT maxillofacial, soft tissue neck: Asymmetric soft tissue density/fullness at the base of the right aryepiglottic fold, extending into the right piriform sinus with possible involvement of the right glottis inferiorly.  If echo to measure or define a discrete mass within this region given lack of IV contrast. ENT consulted for direct visualization.  Patient underwent  laryngoscopy with biopsy and tracheostomy 8/10.   Subjective / 24h Interval events: Alert, appropriate.  Could not tolerate chest PT due to back pain  Assessment & Plan: Principal Problem Acute Hypoxic Respiratory Failure -Suspect related to component of aspiration due to dysphagia larynx CA, COPD exacerbation -He has completed 5 days of IV steroids, wheezing is improved -Currently on antibiotics with Vanco and cefepime, respiratory culture with normal respiratory flora, narrowed to cefepime alone, continue, still has significant secretions, today is day 5 of cefepime and has received Ceftriaxone prior. Would favor 2 more days of cefepime  -Continue Mucomyst 3 times daily -ENT managing trach care -If continues to be unable to tolerate chest PT we will discontinue -Secretions are no longer as thick  Active Problems Squamous cell carcinoma Larynx-CT: With asymmetric soft tissue density fullness at the base of the right aryepiglottic fold: ENT consulted.  Patient underwent laryngoscopy with biopsy and tracheostomy 8/11. Stable on  ATC 5 L oxygen.  -Biopsy showed squamous cell carcinoma with foci suspicious for invasion.  -He was discussed at tumor board, will have outpatient follow-up with medical oncology, radiation oncology.  CT scan of the neck and chest showing that the pulmonary nodules have not progressed since 2019 therefore benign -Trach changed to Shiley 6 Prox XLT Cuffless.    1.2 cm spiculated right upper lobe nodule-outpatient follow up   Left Renal mass -Concerning for renal carcinoma, will need to follow-up with urology as an outpatient   Diabetes mellitus - Not on medication. Continue SSI  HTN -Resume metoprolol, Continue with Norvasc.    Leukocytosis probably related to steroids vs infection. Continue to monitor CBC  Anxiety-low dose benzo.    Nutrition-Continue with tube feeding.  Consult IR for PEG tube.  WBC are now normalized  Scheduled Meds:  acetaminophen  500 mg Oral TID   allopurinol  100 mg Oral Daily   amLODipine  5 mg Oral Daily   arformoterol  15 mcg Nebulization BID   budesonide (PULMICORT) nebulizer solution  0.25 mg Nebulization BID   chlorhexidine gluconate (MEDLINE KIT)  15 mL Mouth Rinse BID   Chlorhexidine Gluconate Cloth  6 each Topical Daily   feeding supplement (PROSource TF)  45 mL Per Tube BID   free water  150 mL Per Tube Q4H   guaiFENesin  10 mL Oral Q8H   insulin aspart  0-5 Units Subcutaneous QHS   insulin aspart  0-9 Units Subcutaneous TID WC   lidocaine  1 application Urethral Once   mouth rinse  15 mL Mouth Rinse 10 times per day   metoprolol tartrate  12.5 mg Oral Daily   nystatin  5 mL Oral QID  revefenacin  175 mcg Nebulization Daily   scopolamine  1 patch Transdermal Q72H   sennosides  5 mL Oral BID   sodium chloride flush  10-40 mL Intracatheter Q12H   Continuous Infusions:  ceFEPime (MAXIPIME) IV Stopped (07/11/21 0610)   feeding supplement (OSMOLITE 1.5 CAL) 1,000 mL (07/09/21 1521)   PRN Meds:.albuterol, bisacodyl, glycopyrrolate, hydrALAZINE,  lip balm, sodium chloride flush, traMADol  Diet Orders (From admission, onward)     Start     Ordered   07/03/21 1433  Diet NPO time specified Except for: Ice Chips  Diet effective now       Comments: Occasional ice chips after oral care  Question:  Except for  Answer:  Ice Chips   07/03/21 1432            DVT prophylaxis: Place and maintain sequential compression device Start: 07/04/21 1027     Code Status: Full Code  Family Communication: Daughter at bedside  Status is: Inpatient  Remains inpatient appropriate because:IV treatments appropriate due to intensity of illness or inability to take PO  Dispo: The patient is from: Home              Anticipated d/c is to: Home              Patient currently is not medically stable to d/c.   Difficult to place patient No  Level of care: Progressive  Consultants:  ENT  Procedures:  Laryngoscopy / trach  Microbiology  none  Antimicrobials: Cefepime    Objective: Vitals:   07/11/21 0700 07/11/21 0748 07/11/21 0753 07/11/21 0953  BP: (!) 163/54 (!) 163/54 (!) 172/70 (!) 180/79  Pulse: 77 72 72 84  Resp: 16 15 13    Temp:   98.2 F (36.8 C)   TempSrc:   Oral   SpO2: 98% 100% 100%   Weight:      Height:        Intake/Output Summary (Last 24 hours) at 07/11/2021 1044 Last data filed at 07/11/2021 0746 Gross per 24 hour  Intake 2740 ml  Output 1700 ml  Net 1040 ml    Filed Weights   07/07/21 0501 07/08/21 0505 07/11/21 0506  Weight: 71.6 kg 71.8 kg 70.6 kg    Examination:  Constitutional: Alert, no significant distress Eyes: No scleral icterus ENMT: mmm Neck: normal, supple Respiratory: Faint rhonchi bilaterally, no wheezing heart Cardiovascular: Regular rate and rhythm, no murmurs, no edema Abdomen: Soft, nontender, nondistended, bowel sounds positive Musculoskeletal: no clubbing / cyanosis.  Skin: No rash seen Neurologic: Equal strength, no focal  Data Reviewed: I have independently reviewed  following labs and imaging studies   CBC: Recent Labs  Lab 07/06/21 0240 07/07/21 1128 07/08/21 1143 07/09/21 0312 07/11/21 0304  WBC 12.4* 13.3* 12.1* 14.3* 10.1  HGB 11.7* 12.6* 11.8* 12.2* 11.1*  HCT 37.0* 39.4 38.0* 39.2 35.1*  MCV 96.6 94.3 95.7 95.4 93.9  PLT 147* 142* 133* 131* 629    Basic Metabolic Panel: Recent Labs  Lab 07/05/21 0538 07/05/21 1646 07/06/21 0240 07/07/21 1128 07/08/21 1258 07/09/21 0312 07/11/21 0304  NA 141  --  139 139 136 134* 135  K 4.1  --  4.8 4.3 5.1 4.6 4.9  CL 107  --  106 105 104 101 102  CO2 26  --  23 26 29 27 26   GLUCOSE 220*  --  226* 144* 150* 125* 159*  BUN 35*  --  34* 26* 26* 24* 26*  CREATININE 1.10  --  0.94 0.79 0.78 0.81 0.83  CALCIUM 9.1  --  9.0 8.8* 8.9 9.0 9.1  MG 2.7* 2.6*  --   --   --   --   --   PHOS 2.7 2.4* 2.5  --   --   --   --     Liver Function Tests: Recent Labs  Lab 07/11/21 0304  AST 13*  ALT 14  ALKPHOS 56  BILITOT 0.3  PROT 6.1*  ALBUMIN 2.4*   Coagulation Profile: No results for input(s): INR, PROTIME in the last 168 hours. HbA1C: No results for input(s): HGBA1C in the last 72 hours. CBG: Recent Labs  Lab 07/10/21 1624 07/10/21 2030 07/11/21 0057 07/11/21 0503 07/11/21 0749  GLUCAP 111* 156* 161* 156* 156*     Recent Results (from the past 240 hour(s))  Resp Panel by RT-PCR (Flu A&B, Covid) Nasopharyngeal Swab     Status: None   Collection Time: 07/01/21  5:39 PM   Specimen: Nasopharyngeal Swab; Nasopharyngeal(NP) swabs in vial transport medium  Result Value Ref Range Status   SARS Coronavirus 2 by RT PCR NEGATIVE NEGATIVE Final    Comment: (NOTE) SARS-CoV-2 target nucleic acids are NOT DETECTED.  The SARS-CoV-2 RNA is generally detectable in upper respiratory specimens during the acute phase of infection. The lowest concentration of SARS-CoV-2 viral copies this assay can detect is 138 copies/mL. A negative result does not preclude SARS-Cov-2 infection and should not be  used as the sole basis for treatment or other patient management decisions. A negative result may occur with  improper specimen collection/handling, submission of specimen other than nasopharyngeal swab, presence of viral mutation(s) within the areas targeted by this assay, and inadequate number of viral copies(<138 copies/mL). A negative result must be combined with clinical observations, patient history, and epidemiological information. The expected result is Negative.  Fact Sheet for Patients:  EntrepreneurPulse.com.au  Fact Sheet for Healthcare Providers:  IncredibleEmployment.be  This test is no t yet approved or cleared by the [place] FDA and  has been authorized for detection and/or diagnosis of SARS-CoV-2 by FDA under an Emergency Use Authorization (EUA). This EUA will remain  in effect (meaning this test can be used) for the duration of the COVID-19 declaration under Section 564(b)(1) of the Act, 21 U.S.C.section 360bbb-3(b)(1), unless the authorization is terminated  or revoked sooner.       Influenza A by PCR NEGATIVE NEGATIVE Final   Influenza B by PCR NEGATIVE NEGATIVE Final    Comment: (NOTE) The Xpert Xpress SARS-CoV-2/FLU/RSV plus assay is intended as an aid in the diagnosis of influenza from Nasopharyngeal swab specimens and should not be used as a sole basis for treatment. Nasal washings and aspirates are unacceptable for Xpert Xpress SARS-CoV-2/FLU/RSV testing.  Fact Sheet for Patients: EntrepreneurPulse.com.au  Fact Sheet for Healthcare Providers: IncredibleEmployment.be  This test is not yet approved or cleared by the [place] FDA and has been authorized for detection and/or diagnosis of SARS-CoV-2 by FDA under an Emergency Use Authorization (EUA). This EUA will remain in effect (meaning this test can be used) for the duration of the COVID-19 declaration under Section  564(b)(1) of the Act, 21 U.S.C. section 360bbb-3(b)(1), unless the authorization is terminated or revoked.  Performed at [facility], [place] [address], [place], [place] [address]   MRSA Next Gen by PCR, Nasal     Status: None   Collection Time: 07/02/21  6:26 PM   Specimen: Nasal Mucosa; Nasal Swab  Result Value Ref  Range Status   MRSA by PCR Next Gen NOT DETECTED NOT DETECTED Final    Comment: (NOTE) The GeneXpert MRSA Assay (FDA approved for NASAL specimens only), is one component of a comprehensive MRSA colonization surveillance program. It is not intended to diagnose MRSA infection nor to guide or monitor treatment for MRSA infections. Test performance is not FDA approved in patients less than 16 years old. Performed at [facility], [place] [address] [place], [place] [address]   Culture, Respiratory w Gram Stain     Status: None   Collection Time: 07/06/21  2:17 PM   Specimen: Tracheal Aspirate; Respiratory  Result Value Ref Range Status   Specimen Description TRACHEAL ASPIRATE  Final   Special Requests NONE  Final   Gram Stain   Final    RARE WBC PRESENT, PREDOMINANTLY PMN FEW GRAM POSITIVE COCCI IN CLUSTERS RARE GRAM NEGATIVE RODS RARE GRAM POSITIVE RODS    Culture   Final    FEW Normal respiratory flora-no Staph aureus or Pseudomonas seen Performed at [facility], [address] [place], [place] [address]    Report Status 07/08/2021 FINAL  Final      Radiology Studies: CT SOFT TISSUE NECK W CONTRAST  Result Date: 07/10/2021 CLINICAL DATA:  Thickening of right aryepiglottic fold seen on recent CT neck, ENT consult it, patient underwent laryngoscopy with biopsy and tracheostomy 810. EXAM: CT NECK WITH CONTRAST TECHNIQUE: Multidetector CT imaging of the neck was performed using the standard protocol following the bolus administration of intravenous contrast. CONTRAST:  41m OMNIPAQUE IOHEXOL 300 MG/ML  SOLN COMPARISON:  Noncontrast CT neck 07/02/2021,  cervical spine CT 06/29/2019 FINDINGS: Pharynx and larynx: There is soft tissue thickening in the region of the right arytenoid corresponding to the abnormality seen on recent endoscopy. The abnormality measures approximately 2.5 cm by 1.4 cm in the axial plane (3-61). There is effacement of the right pre epiglottic and paraglottic fat. There is thickening of the right aryepiglottic fold with effacement of the piriform sinus (3-55). There is mild hyperenhancement of the right true vocal fold. There is no definite involvement of the false fold or evidence of subglottic extension. The lesion abuts the thyroid cartilage without definite evidence of invasion. There is no evidence of extra laryngeal extension. The oropharynx, oral cavity, and nasopharynx are unremarkable. Salivary glands: The parotid and submandibular glands are unremarkable. Thyroid: Unremarkable. Lymph nodes: There is no pathologic lymphadenopathy in the neck. Vascular: There is calcified atherosclerotic plaque of the bilateral carotid bulbs. The left internal jugular vein is occluded in the upper neck, likely chronic. Limited intracranial: The imaged portions of the intracranial compartment are unremarkable. Visualized orbits: The orbits are not imaged. Mastoids and visualized paranasal sinuses: The imaged paranasal sinuses and mastoid air cells are clear. Skeleton: There is multilevel degenerative change of the cervical spine. There is no aggressive osseous lesion or acute osseous abnormality. Upper chest: A tracheostomy tube is in place, new since the prior CT. There is no evidence of complication, the tip is not imaged. The lungs and thorax are assessed on the separately dictated CT chest. Other: None. IMPRESSION: 1. Enhancing lesion in the region of the right arytenoid cartilage measuring approximately 2.5 cm x 1.4 cm corresponding to the known malignancy seen on recent endoscopy. There is involvement of the right aryepiglottic fold with  effacement of the right piriform sinus and involvement of the right pre epiglottic and paraglottic fat. 2. Mild hyperenhancement of the right true vocal fold  raises suspicion for involvement (though no abnormality was seen on endoscopy). No definite involvement of the false fold or evidence of subglottic extension. 3. No lymphadenopathy in the neck. 4. Please also refer to the separately dictated CT chest. Electronically Signed   By: Valetta Mole M.D.   On: 07/10/2021 16:05   CT CHEST W CONTRAST  Result Date: 07/10/2021 CLINICAL DATA:  Renal mass.  Staging of head neck cancer. EXAM: CT CHEST WITH CONTRAST TECHNIQUE: Multidetector CT imaging of the chest was performed during intravenous contrast administration. CONTRAST:  33m OMNIPAQUE IOHEXOL 300 MG/ML  SOLN COMPARISON:  Neck CT from 07/02/2021; chest CT from 07/01/2021 and chest CT from 05/18/2018. FINDINGS: Cardiovascular: Coronary, aortic arch, and branch vessel atherosclerotic vascular disease. Small chronic anterior pericardial effusion. Mediastinum/Nodes: Tracheostomy tube satisfactorily position. Enteric tube extends down into the stomach. Scattered calcified mediastinal and hilar lymph nodes compatible with old granulomatous disease. No pathologic thoracic adenopathy observed. Lungs/Pleura: Mild biapical pleuroparenchymal scarring. 0.5 cm partially calcified right upper lobe nodule medially on image 18 series 4, previously 0.7 cm on 05/18/2018, considered benign. Mildly spiculated right upper lobe nodule 0.9 by 0.6 cm on image 20 series 4, previously 0.9 by 0.7 cm on 05/18/2018, this has a component extending to the pleural surface in a scar-like manner. Additional scattered scarring is present in the right lung along with calcified right upper lobe granuloma and possible adjacent broncholith spirits Airway plugging in both lower lobes with small bilateral pleural effusions and bilateral airway thickening. Stable 3 mm subpleural nodule in the left lower  lobe along the fissure on image 42 series 4. No substantial new or progressive nodules compared to 05/18/2018, the intervening greater than 3 years of stability suggests that the visualize nodules are benign. Upper Abdomen: 1.2 by 0.9 cm hypodense lesion inferiorly in the right hepatic lobe on image 63 series 3, similar to 03/01/2020, likely benign. IVC filter noted. Abdominal aortic atherosclerosis. 3.0 by 2.5 cm right adrenal mass this measured below 10 Hounsfield units on 07/01/2021 and accordingly probably represents an adenoma. We partially include the mixed density mass of the left mid kidney laterally on image 62 series 3, this appears similar to the 07/01/2021 CT abdomen exam. The feeding tube terminates in the duodenal bulb. Musculoskeletal: Old healed bilateral rib fractures. Thoracic kyphosis. Unchanged compression fracture at T12. Thoracic spondylosis. IMPRESSION: 1. The pulmonary nodule of concern in the right upper lobe and the other scattered pulmonary nodules are stable or decreased in size from 05/18/2018, hence likely benign. 2. The mixed density left renal mass appears stable compared to the recent CT abdomen from 07/01/2021. 3. Other imaging findings of potential clinical significance: Aortic Atherosclerosis (ICD10-I70.0). Coronary atherosclerosis. Old granulomatous disease. Feeding tube extends into the duodenal bulb. Recently placed tracheostomy tube appears satisfactorily positioned. Airway plugging in both lower lobes with mild atelectasis and small bilateral pleural effusions. Right adrenal adenoma. Electronically Signed   By: WVan ClinesM.D.   On: 07/10/2021 15:51      CMarzetta Board MD, PhD [facility]  Between 7 am - 7 pm I am available, please contact me via Amion (for emergencies) or Securechat (non urgent messages)  Between 7 pm - 7 am I am not available, please contact night coverage MD/APP via Amion

## 2021-07-11 NOTE — Progress Notes (Signed)
Additional non face to face encounter-   Spoke with daughter, [name].  She shares that patient is very upset about his current state. He also has some dementia and continues to forget his recent diagnosis of cancer.  When she spoke to him yesterday and told him again he had cancer and that he needed a feeding tube, he clearly expressed his dismay. When his passy-muir valve was inserted he said, "I'm not doing this anymore".  [name] is unaware of his advanced directives.  [name] notes that his spouse would be the best person to have further discussions with regarding his plan of care, advanced directives.  Plan is made for [facility] to contact her mom and call me back with a time to meet here at the hospital tomorrow.   Mariana Kaufman, AGNP-C Palliative Medicine  Total time: 38 mins

## 2021-07-11 NOTE — Progress Notes (Signed)
Inpatient Rehab Admissions Coordinator:   Pt.'s wife is unable to care for Pt. Following d/c and she requests SNF placement, preferably at [facility].  CIR will sign off.   Clemens Catholic, [place], [place] Admissions Coordinator  [phone] ([place]) [phone] (office)

## 2021-07-11 NOTE — Progress Notes (Signed)
RT NOTE:  I did not start pt on chest PT due to a continuous amount of thin secretions coming up. Night shift started chest PT and gave report that the pt could only take about 5 mins over night. Will continue CPT this morning and reassess pt. RT will continue to monitor.

## 2021-07-11 NOTE — TOC Progression Note (Signed)
Transition of Care [facility]) - Progression Note    Patient Details  Name: Christopher Santiago MRN: [id number] Date of Birth: 1931/06/28  Transition of Care [facility]) CM/SW [place], [place] Phone Number: 07/11/2021, 4:15 PM  Clinical Narrative:     TOC is following for SNF placement. Per chart review; family is requesting [facility]. TOC will work towards SNF once pt is closer to being medically stable. If a SNF is willing to take a trach; SNF's normally need for new trach to have been in place for 28-31 days prior to accept. Pt also currently has a coretrak which would need to be removed prior to SNF admission.   Expected Discharge Plan: Skilled Nursing Facility Barriers to Discharge: Continued Medical Work up  Expected Discharge Plan and Services Expected Discharge Plan: [place]                                               Social Determinants of Health (SDOH) Interventions    Readmission Risk Interventions No flowsheet data found.

## 2021-07-11 NOTE — Progress Notes (Signed)
Occupational Therapy Treatment Patient Details Name: Christopher Santiago MRN: [id number] DOB: Mar 17, 1931 Today's Date: 07/11/2021    History of present illness Pt is an 87 y/o male admitted secondary to worsening SOB, especially with exertion. Found to have acute respiratory failure with hypoxia. Imaging showed fullness of the right aryepiglottic fold extending into the right pyriform and L renal mass. Pt had Flexible laryngoscopy on 8/10. Trach in place. Workup pending. PMH includes vocal cord cancer, COPD, CAD, DM, HTN, and R THA.   OT comments  Pt slowly progressing toward established goals. Pt received awake and more alert this session per PT from prior session. Pt received performing functional mobility with PT/Rehab tech, however, observed to bowel incontinence. OT stood with pt with min A and use of RW requiring cues/reminders for hand placement with safety in standing. Pt required max A pericare. Once seated in recliner, pt demonstrate LB seated dressing of donning socks with min guard, mostly for safety with lines and leads. Pt will benefit to continue skilled level OT to address established deficits. Per note, wife unable to assist pt at home. DC recommendation changed to SNF.    Follow Up Recommendations  SNF   Equipment Recommendations  Other (comment) (Defer to next level of care.)    Recommendations for Other Services Rehab consult    Precautions / Restrictions Precautions Precautions: Fall Precaution Comments: Watch O2. Restrictions Weight Bearing Restrictions: No       Mobility Bed Mobility Overal bed mobility: Needs Assistance             General bed mobility comments: received sitting EOB with PT, defer to PT note.    Transfers Overall transfer level: Needs assistance Equipment used: Rolling walker (2 wheeled) Transfers: Sit to/from Stand Sit to Stand: +2 physical assistance;+2 safety/equipment;Mod assist         General transfer comment: Mod A +2 mostly  for management of lines and leads. Improved stability, however, constant cueing/reminders for hand placement. mod A +2 from stand to sit for controlled descend.    Balance Overall balance assessment: Needs assistance Sitting-balance support: No upper extremity supported;Feet supported Sitting balance-Leahy Scale: Poor Sitting balance - Comments: Min guard to Min A to maintain static sitting balance at EOB. Postural control: Other (comment) (anterior lean) Standing balance support: Bilateral upper extremity supported;During functional activity Standing balance-Leahy Scale: Poor Standing balance comment: Reliant on +2 assist to maintain static standing balance                           ADL either performed or assessed with clinical judgement   ADL Overall ADL's : Needs assistance/impaired                     Lower Body Dressing: Min guard;Sitting/lateral leans Lower Body Dressing Details (indicate cue type and reason): Pt seated in recliner with set up/min guard for safety with lines and leads with donning socks due to soiled socks from bowel incontinence. Pt able to present BLE to figure 4 position to safely don socks with increased time. Toilet Transfer: Moderate assistance;+2 for physical assistance;+2 for safety/equipment;RW Toilet Transfer Details (indicate cue type and reason): Pt received up OOB with PT/rehab tech, however, observed to have bowel incontinence, once in static stating min A +1 for safety with cues to keep hands placed on RW for stability. Pt on RA while in standing 91% O2 saturation, however, increased to 96% O2 sats once seated in  recliner. Toileting- Clothing Manipulation and Hygiene: Maximal assistance Toileting - Clothing Manipulation Details (indicate cue type and reason): max A with patient demonstrating static standing with RW.     Functional mobility during ADLs: Moderate assistance;+2 for physical assistance;Cueing for safety General ADL  Comments: Improved function for seated LB dressing, increased time required.     Vision   Vision Assessment?: No apparent visual deficits   Perception     Praxis      Cognition Arousal/Alertness: Awake/alert Behavior During Therapy: WFL for tasks assessed/performed Overall Cognitive Status: Difficult to assess                                 General Comments: increased alertness for today's session with PT following into OT.        Exercises     Shoulder Instructions       General Comments      Pertinent Vitals/ Pain       Pain Assessment: Faces Faces Pain Scale: Hurts a little bit Pain Location: throat region Pain Descriptors / Indicators: Aching;Sore Pain Intervention(s): Monitored during session;Repositioned  Home Living                                          Prior Functioning/Environment              Frequency  Min 2X/week        Progress Toward Goals  OT Goals(current goals can now be found in the care plan section)  Progress towards OT goals: Progressing toward goals  Acute Rehab OT Goals Patient Stated Goal: Per wife, for patient to get better. OT Goal Formulation: With patient/family Time For Goal Achievement: 07/17/21 Potential to Achieve Goals: Fair ADL Goals Pt Will Perform Eating: Independently Pt Will Perform Grooming: with modified independence;standing Pt Will Perform Upper Body Dressing: Independently Pt Will Perform Lower Body Dressing: with modified independence;sit to/from stand Pt Will Transfer to Toilet: with modified independence;ambulating Pt Will Perform Toileting - Clothing Manipulation and hygiene: with modified independence;sit to/from stand Pt Will Perform Tub/Shower Transfer: ambulating;rolling walker;Tub transfer;Shower transfer Pt/caregiver will Perform Home Exercise Program: Increased ROM;Increased strength;Both right and left upper extremity  Plan Discharge plan needs to be  updated;Frequency remains appropriate    Co-evaluation                 AM-PAC OT "6 Clicks" Daily Activity     Outcome Measure   Help from another person eating meals?: Total Help from another person taking care of personal grooming?: A Lot Help from another person toileting, which includes using toliet, bedpan, or urinal?: Total Help from another person bathing (including washing, rinsing, drying)?: A Lot Help from another person to put on and taking off regular upper body clothing?: A Lot Help from another person to put on and taking off regular lower body clothing?: A Little 6 Click Score: 11    End of Session Equipment Utilized During Treatment: Gait belt;Oxygen;Other (comment);Rolling walker (via trach collar)  OT Visit Diagnosis: Unsteadiness on feet (R26.81);Muscle weakness (generalized) (M62.81)   Activity Tolerance Patient limited by pain;No increased pain   Patient Left in chair;with call bell/phone within reach;with chair alarm set   Nurse Communication Mobility status;Other (comment) (Tremor like movements of all 4 extremities. RN notified.)        Time:  8441-7127 OT Time Calculation (min): 26 min  Charges: OT General Charges $OT Visit: 1 Visit OT Treatments $Self Care/Home Management : 8-22 mins  Minus Breeding, MSOT, OTR/L  Supplemental Rehabilitation Services  [phone]    Christopher Santiago 07/11/2021, 1:37 PM

## 2021-07-12 DIAGNOSIS — F0390 Unspecified dementia without behavioral disturbance: Secondary | ICD-10-CM

## 2021-07-12 LAB — BASIC METABOLIC PANEL
Anion gap: 5 (ref 5–15)
BUN: 26 mg/dL — ABNORMAL HIGH (ref 8–23)
CO2: 26 mmol/L (ref 22–32)
Calcium: 8.8 mg/dL — ABNORMAL LOW (ref 8.9–10.3)
Chloride: 100 mmol/L (ref 98–111)
Creatinine, Ser: 0.83 mg/dL (ref 0.61–1.24)
GFR, Estimated: >60 mL/min (ref >60)
Glucose, Bld: 141 mg/dL — ABNORMAL HIGH (ref 70–99)
Potassium: 4.7 mmol/L (ref 3.5–5.1)
Sodium: 131 mmol/L — ABNORMAL LOW (ref 135–145)

## 2021-07-12 LAB — CBC
HCT: 32.4 % — ABNORMAL LOW (ref 39.0–52.0)
Hemoglobin: 10.6 g/dL — ABNORMAL LOW (ref 13.0–17.0)
MCH: 30.5 pg (ref 26.0–34.0)
MCHC: 32.7 g/dL (ref 30.0–36.0)
MCV: 93.1 fL (ref 80.0–100.0)
Platelets: 157 10*3/uL (ref 150–400)
RBC: 3.48 MIL/uL — ABNORMAL LOW (ref 4.22–5.81)
RDW: 13.6 % (ref 11.5–15.5)
WBC: 9.4 10*3/uL (ref 4.0–10.5)
nRBC: 0 % (ref 0.0–0.2)

## 2021-07-12 LAB — GLUCOSE, CAPILLARY
Glucose-Capillary: 114 mg/dL — ABNORMAL HIGH (ref 70–99)
Glucose-Capillary: 117 mg/dL — ABNORMAL HIGH (ref 70–99)
Glucose-Capillary: 138 mg/dL — ABNORMAL HIGH (ref 70–99)
Glucose-Capillary: 142 mg/dL — ABNORMAL HIGH (ref 70–99)
Glucose-Capillary: 84 mg/dL (ref 70–99)
Glucose-Capillary: 94 mg/dL (ref 70–99)

## 2021-07-12 MED ORDER — DEXTROSE-NACL 5-0.9 % IV SOLN: INTRAVENOUS | Status: DC

## 2021-07-12 MED ORDER — LORAZEPAM 2 MG/ML IJ SOLN
0.2500 mg | INTRAMUSCULAR | Status: DC | PRN
  Administered 2021-07-12 – 2021-07-29 (×6): 0.25 mg via INTRAVENOUS
  Filled 2021-07-12 (×7): qty 1

## 2021-07-12 MED ORDER — SODIUM CHLORIDE 0.9 % IV SOLN
2.0000 g | Freq: Two times a day (BID) | INTRAVENOUS | Status: AC
  Administered 2021-07-12 – 2021-07-14 (×4): 2 g via INTRAVENOUS
  Filled 2021-07-12 (×5): qty 2

## 2021-07-12 NOTE — TOC Progression Note (Signed)
CSW left vm for patient's daughter.  Gilmore Laroche, MSW, [facility]

## 2021-07-12 NOTE — Progress Notes (Signed)
RT NOTES: Found patient standing beside bed with mitts off and holding IV in his hand that he had removed. Got patient back in bed and notified nurse. Nurse tech replaced mitts. Lurline Idol is intact. Vitals stable. Will continue to monitor.

## 2021-07-12 NOTE — Progress Notes (Signed)
PROGRESS NOTE  Christopher Santiago BSJ:[id number] DOB: 1931/05/12 DOA: 07/01/2021 PCP: Tonia Ghent, MD   LOS: 11 days   Brief Narrative / Interim history: 87 year old with past medical history significant for COPD, quit smoking 30 years ago, vocal cord cancer, diabetes who presents complaining of exertional dyspnea, reports sinusitis, postnasal drip, hoarseness for the past 3 to 4 months. He was also complaining of difficulty swallowing, food gets stuck in the throat, he also report phlegm, in his throat. CT maxillofacial, soft tissue neck: Asymmetric soft tissue density/fullness at the base of the right aryepiglottic fold, extending into the right piriform sinus with possible involvement of the right glottis inferiorly.  If echo to measure or define a discrete mass within this region given lack of IV contrast. ENT consulted for direct visualization.  Patient underwent  laryngoscopy with biopsy and tracheostomy 8/10.   Subjective / 24h Interval events: Pulled his NG tube overnight, more confused this morning  Assessment & Plan: Principal Problem Acute Hypoxic Respiratory Failure -Suspect related to component of aspiration due to dysphagia larynx CA, COPD exacerbation -He has completed 5 days of IV steroids, wheezing is improved -Currently on antibiotics with Vanco and cefepime, respiratory culture with normal respiratory flora, narrowed to cefepime alone, continue, still has significant secretions, today is day 6 of cefepime and has received Ceftriaxone prior. Would favor 1 more days of cefepime  -Continue Mucomyst 3 times daily -ENT managing trach care -If continues to be unable to tolerate chest PT we will discontinue -Improving secretions  Active Problems Squamous cell carcinoma Larynx-CT: With asymmetric soft tissue density fullness at the base of the right aryepiglottic fold: ENT consulted.  Patient underwent laryngoscopy with biopsy and tracheostomy 8/11. Stable on ATC 5 L oxygen.   -Biopsy showed squamous cell carcinoma with foci suspicious for invasion.  -He was discussed at tumor board, will have outpatient follow-up with medical oncology, radiation oncology.  CT scan of the neck and chest showing that the pulmonary nodules have not progressed since 2019 therefore benign -Trach changed to Shiley 6 Prox XLT Cuffless.   Acute metabolic encephalopathy- intermittent, in the setting of acute illness/hospitalization.  Ativan as needed today  1.2 cm spiculated right upper lobe nodule-outpatient follow up   Left Renal mass -Concerning for renal carcinoma, will need to follow-up with urology as an outpatient   Diabetes mellitus - Not on medication. Continue SSI  HTN -Resume metoprolol, Continue with Norvasc.    Leukocytosis probably related to steroids vs infection. Continue to monitor CBC   Nutrition-tube feeding stopped as patient discontinued his core track.  Placed on IV fluids.  Consult IR for PEG tube.  WBC are now normalized  Scheduled Meds:  acetaminophen  500 mg Per Tube TID   allopurinol  100 mg Oral Daily   amLODipine  5 mg Oral Daily   arformoterol  15 mcg Nebulization BID   budesonide (PULMICORT) nebulizer solution  0.25 mg Nebulization BID   chlorhexidine gluconate (MEDLINE KIT)  15 mL Mouth Rinse BID   Chlorhexidine Gluconate Cloth  6 each Topical Daily   feeding supplement (PROSource TF)  45 mL Per Tube BID   free water  150 mL Per Tube Q4H   guaiFENesin  10 mL Oral Q8H   insulin aspart  0-5 Units Subcutaneous QHS   insulin aspart  0-9 Units Subcutaneous TID WC   lidocaine  1 application Urethral Once   mouth rinse  15 mL Mouth Rinse 10 times per day   metoprolol tartrate  12.5 mg Oral Daily   nystatin  5 mL Oral QID   revefenacin  175 mcg Nebulization Daily   scopolamine  1 patch Transdermal Q72H   sennosides  5 mL Oral BID   sodium chloride flush  10-40 mL Intracatheter Q12H   Continuous Infusions:  ceFEPime (MAXIPIME) IV 2 g (07/12/21  0538)   feeding supplement (OSMOLITE 1.5 CAL) 55 mL/hr at 07/11/21 1700   PRN Meds:.albuterol, bisacodyl, hydrALAZINE, lip balm, LORazepam, sodium chloride flush, traMADol  Diet Orders (From admission, onward)     Start     Ordered   07/03/21 1433  Diet NPO time specified Except for: Ice Chips  Diet effective now       Comments: Occasional ice chips after oral care  Question:  Except for  Answer:  Ice Chips   07/03/21 1432            DVT prophylaxis: Place and maintain sequential compression device Start: 07/04/21 1027     Code Status: Full Code  Family Communication: Daughter at bedside  Status is: Inpatient  Remains inpatient appropriate because:IV treatments appropriate due to intensity of illness or inability to take PO  Dispo: The patient is from: Home              Anticipated d/c is to: Home              Patient currently is not medically stable to d/c.   Difficult to place patient No  Level of care: Progressive  Consultants:  ENT  Procedures:  Laryngoscopy / trach  Microbiology  none  Antimicrobials: Cefepime    Objective: Vitals:   07/12/21 1012 07/12/21 1036 07/12/21 1114 07/12/21 1149  BP: (!) 195/71 136/60 136/60 (!) 110/46  Pulse: 95 100 98 94  Resp: (!) 23 (!) 22 (!) 22 (!) 24  Temp:    98.2 F (36.8 C)  TempSrc:    Oral  SpO2: 90% 98% 97% 96%  Weight:      Height:        Intake/Output Summary (Last 24 hours) at 07/12/2021 1258 Last data filed at 07/12/2021 0538 Gross per 24 hour  Intake 800 ml  Output 600 ml  Net 200 ml    Filed Weights   07/07/21 0501 07/08/21 0505 07/11/21 0506  Weight: 71.6 kg 71.8 kg 70.6 kg    Examination:  Constitutional: Alert, confused Eyes: No scleral icterus ENMT: mmm Neck: normal, supple Respiratory: No wheezing, no crackles, faint rhonchi at the bases Cardiovascular: Regular rate and rhythm, no murmurs, no peripheral edema Abdomen: Soft, nontender, nondistended, bowel sounds  positive Musculoskeletal: no clubbing / cyanosis.  Skin: No rashes seen Neurologic: Equal strength, no focal deficits  Data Reviewed: I have independently reviewed following labs and imaging studies   CBC: Recent Labs  Lab 07/07/21 1128 07/08/21 1143 07/09/21 0312 07/11/21 0304 07/12/21 0124  WBC 13.3* 12.1* 14.3* 10.1 9.4  HGB 12.6* 11.8* 12.2* 11.1* 10.6*  HCT 39.4 38.0* 39.2 35.1* 32.4*  MCV 94.3 95.7 95.4 93.9 93.1  PLT 142* 133* 131* 156 127    Basic Metabolic Panel: Recent Labs  Lab 07/05/21 1646 07/06/21 0240 07/06/21 0240 07/07/21 1128 07/08/21 1258 07/09/21 0312 07/11/21 0304 07/12/21 0124  NA  --  139   < > 139 136 134* 135 131*  K  --  4.8   < > 4.3 5.1 4.6 4.9 4.7  CL  --  106   < > 105 104 101 102 100  CO2  --  23   < > 26 29 27 26 26   GLUCOSE  --  226*   < > 144* 150* 125* 159* 141*  BUN  --  34*   < > 26* 26* 24* 26* 26*  CREATININE  --  0.94   < > 0.79 0.78 0.81 0.83 0.83  CALCIUM  --  9.0   < > 8.8* 8.9 9.0 9.1 8.8*  MG 2.6*  --   --   --   --   --   --   --   PHOS 2.4* 2.5  --   --   --   --   --   --    < > = values in this interval not displayed.    Liver Function Tests: Recent Labs  Lab 07/11/21 0304  AST 13*  ALT 14  ALKPHOS 56  BILITOT 0.3  PROT 6.1*  ALBUMIN 2.4*    Coagulation Profile: No results for input(s): INR, PROTIME in the last 168 hours. HbA1C: No results for input(s): HGBA1C in the last 72 hours. CBG: Recent Labs  Lab 07/11/21 2001 07/12/21 0024 07/12/21 0416 07/12/21 0717 07/12/21 1148  GLUCAP 152* 117* 142* 114* 138*     Recent Results (from the past 240 hour(s))  MRSA Next Gen by PCR, Nasal     Status: None   Collection Time: 07/02/21  6:26 PM   Specimen: Nasal Mucosa; Nasal Swab  Result Value Ref Range Status   MRSA by PCR Next Gen NOT DETECTED NOT DETECTED Final    Comment: (NOTE) The GeneXpert MRSA Assay (FDA approved for NASAL specimens only), is one component of a comprehensive MRSA  colonization surveillance program. It is not intended to diagnose MRSA infection nor to guide or monitor treatment for MRSA infections. Test performance is not FDA approved in patients less than 50 years old. Performed at [facility], [place] [address], [place], [place] [address]   Culture, Respiratory w Gram Stain     Status: None   Collection Time: 07/06/21  2:17 PM   Specimen: Tracheal Aspirate; Respiratory  Result Value Ref Range Status   Specimen Description TRACHEAL ASPIRATE  Final   Special Requests NONE  Final   Gram Stain   Final    RARE WBC PRESENT, PREDOMINANTLY PMN FEW GRAM POSITIVE COCCI IN CLUSTERS RARE GRAM NEGATIVE RODS RARE GRAM POSITIVE RODS    Culture   Final    FEW Normal respiratory flora-no Staph aureus or Pseudomonas seen Performed at [facility], [address], [place], [place] [address]    Report Status 07/08/2021 FINAL  Final      Radiology Studies: DG Abd Portable 1V  Result Date: 07/11/2021 CLINICAL DATA:  Feeding tube placement EXAM: PORTABLE ABDOMEN - 1 VIEW COMPARISON:  07/05/2011 FINDINGS: Esophageal tube tip overlies the mid stomach. IVC filter to the right of the upper lumbar spine. IMPRESSION: Feeding tube tip overlies the mid stomach Electronically Signed   By: Donavan Foil M.D.   On: 07/11/2021 19:30      Marzetta Board, MD, PhD [facility]  Between 7 am - 7 pm I am available, please contact me via Amion (for emergencies) or Securechat (non urgent messages)  Between 7 pm - 7 am I am not available, please contact night coverage MD/APP via Amion

## 2021-07-12 NOTE — Progress Notes (Signed)
Daily Progress Note   Patient Name: Christopher Santiago       Date: 07/12/2021 DOB: 04-02-1931  Age: 87 y.o. MRN#: [id number] Attending Physician: Caren Griffins, MD Primary Care Physician: Tonia Ghent, MD Admit Date: 07/01/2021  Reason for Consultation/Follow-up: Establishing goals of care  Patient Profile/HPI:  87 y.o. male  with past medical history of COPD, vocal cord cancer, DM admitted on 07/01/2021 with complaints of exertional dyspnea, sinusitis, hoarseness for 3 months. 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 He is s/p trach on 8/10. PEG placement is pending, IR has been consulted. Palliative medicine consulted for goals of care.     Subjective: Met with patient, his spouse, and daughter today.  Patient with dementia, hard of hearing and unable to communicate clearly due to trach. He did not tolerate placement of PM valve and was unable to phonate.  Attempted goals of care discussion- however, patient was unable to recall the reason for his hospitalization or his diagnosis of cancer that has been discussed with him several times before. He was unable to engage in [place]. Therefore discussion held with spouse and daughter separately.   I introduced Palliative Medicine as specialized medical care for people living with serious illness. It focuses on providing relief from the symptoms and stress of a serious illness. The goal is to improve quality of life for both the patient and the family.  We discussed a brief life review of the patient and then focused on their current illness. He is retired from working as an Chief Financial Officer. He is primary physical caretaker for his spouse who is physically but not cognitively disabled.  The natural disease trajectory and expectations at EOL were  discussed.  Prior to admission Christopher Santiago was ambulatory, still driving and independent. He did have noticeable memory issues.  I attempted to elicit values and goals of care important to the patient.    The difference between aggressive medical intervention and comfort care was discussed and considered in light of the patient's goals of care.   If [name] was determined by his medical providers to have an untreatable terminal illness then the goal of care would be to take him home and allow him to die peacefully at home.   Advanced directives, concepts specific to code status, artifical feeding and hydration, and rehospitalization  were considered and discussed.   I completed a MOST form today. The patient and family outlined their wishes for the following treatment decisions:  Cardiopulmonary Resuscitation: Do Not Attempt Resuscitation (DNR/No CPR)  Medical Interventions: Full Scope of Treatment: Use intubation, advanced airway interventions, mechanical ventilation, cardioversion as indicated, medical treatment, IV fluids, etc, also provide comfort measures. Transfer to the hospital if indicated  Antibiotics: Antibiotics if indicated  IV Fluids: IV fluids if indicated  Feeding Tube: Feeding tube long-term if indicated    Discussed the importance of continued conversation with family and the medical providers regarding overall plan of care and treatment options, ensuring decisions are within the context of the patient's values and GOCs. We discussed possible scenarios where the above choices may change. Family is clear that if interventions are not going to prolong patient's life in a meaningful way then they would make decisions for comfort only.   Questions and concerns were addressed.  Hard Choices booklet left for review. The family was encouraged to call with questions or concerns.    Review of Systems  Respiratory:  Positive for sputum production and shortness of breath.     Physical  Exam Vitals and nursing note reviewed.  Cardiovascular:     Comments: Tachycardic at times Pulmonary:     Comments: secretions Skin:    Comments: flushed  Psychiatric:     Comments: Cognitive impairment including judgement,  insight, and memory            Vital Signs: BP 129/60 (BP Location: Left Arm)   Pulse 87   Temp 98.2 F (36.8 C) (Oral)   Resp 20   Ht 5' 6"  (1.676 m)   Wt 70.6 kg   SpO2 96%   BMI 25.12 kg/m  SpO2: SpO2: 96 % O2 Device: O2 Device: Tracheostomy Collar O2 Flow Rate: O2 Flow Rate (L/min): 5 L/min  Intake/output summary:  Intake/Output Summary (Last 24 hours) at 07/12/2021 1413 Last data filed at 07/12/2021 0538 Gross per 24 hour  Intake 700 ml  Output 600 ml  Net 100 ml   LBM: Last BM Date: 07/09/21 Baseline Weight: Weight: 77.1 kg Most recent weight: Weight: 70.6 kg       Palliative Assessment/Data: PPS: 10%      Patient Active Problem List   Diagnosis Date Noted  . Malignant neoplasm of supraglottis ([place]) 07/09/2021  . Tracheostomy status ([place])   . Tracheobronchitis   . Protein-calorie malnutrition, severe 07/03/2021  . Left renal mass 07/02/2021  . Respiratory failure, acute ([place]) 07/02/2021  . Acute respiratory failure with hypoxia ([place]) 07/01/2021  . Back pain 06/05/2020  . Constipation 06/05/2020  . Memory change 07/03/2019  . BPH with urinary obstruction 11/10/2018  . Bronchiectasis ([place]) 08/11/2018  . Anemia 08/11/2018  . Osteoarthritis of right hip 04/11/2018  . Ascending aorta dilation (H[facility]) 03/09/2018  . Pulmonary hypertension, unspecified ([place]) 03/09/2018  . Chronic diastolic CHF (congestive heart failure) ([place]) 03/09/2018  . Chronic cough 06/08/2016  . Alcohol use 02/20/2016  . Mixed hyperlipidemia 02/10/2016  . SOB (shortness of breath) 08/26/2015  . Bradycardia 08/26/2015  . Acute pulmonary embolism ([place]) 11/19/2014  . COPD with acute exacerbation ([place]) 12/27/2012  . Biceps tendonitis 07/12/2012  . Medicare annual  wellness visit, subsequent 02/04/2012  . Advance directive discussed with patient 02/04/2012  . PVD (peripheral vascular disease) ([place]) 12/22/2011  . Rhinitis 01/29/2011  . CERVICAL STRAIN, ACUTE 04/08/2010  . Hypothyroidism 01/17/2009  . CARCINOMA, SKIN, SQUAMOUS CELL 01/08/2009  . Weakness  12/27/2008  . Depression 08/22/2008  . Gout 12/12/2007  . OBESITY 09/24/2007  . Atherosclerosis of native coronary artery of native heart with stable angina pectoris ([place]) 06/16/2007  . ERECTILE DYSFUNCTION 05/07/2007  . OSA on CPAP 05/05/2007  . Essential hypertension 05/05/2007  . Coronary atherosclerosis 05/05/2007  . SLEEP APNEA 05/05/2007  . Hyperglycemia 04/05/2007    Palliative Care Assessment & Plan    Assessment/Recommendations/Plan  DNR At this point family is hopeful for patient to become accustomed to his trach, and have PEG placed- he is easily redirected when he attempts to pull at his trach and IV's, however, due to his impaired memory (which is much worse than he was prior to admission) he quickly forgets and begins to pull- they are going to take a picture of him and create a poster to remind him. We discussed the challenges of cancer treatment for patient's with dementia Family is hopeful for a discussion with Oncology regarding his cancer and treatment options- they will continue to weigh treatment options while also considering his quality of life and goals of care.   Code Status: DNR  Prognosis:  Unable to determine  Discharge Planning: [place] for rehab with Palliative care service follow-up  Care plan was discussed with patient's daughter and spouse.  Thank you for allowing the Palliative Medicine Team to assist in the care of this patient.  Total time: 98 minutes Prolonged billing: Yes     Greater than 50%  of this time was spent counseling and coordinating care related to the above assessment and plan.  Mariana Kaufman, AGNP-C Palliative  Medicine   Please contact Palliative Medicine Team phone at [phone] for questions and concerns.

## 2021-07-12 NOTE — Plan of Care (Signed)
  Problem: Education: Goal: Knowledge of General Education information will improve Description: Including pain rating scale, medication(s)/side effects and non-pharmacologic comfort measures Outcome: Progressing   Problem: Health Behavior/Discharge Planning: Goal: Ability to manage health-related needs will improve Outcome: Progressing   Problem: Clinical Measurements: Goal: Ability to maintain clinical measurements within normal limits will improve Outcome: Progressing Goal: Will remain free from infection Outcome: Progressing Goal: Diagnostic test results will improve Outcome: Progressing Goal: Respiratory complications will improve Outcome: Progressing Goal: Cardiovascular complication will be avoided Outcome: Progressing   Problem: Activity: Goal: Risk for activity intolerance will decrease Outcome: Progressing   Problem: Nutrition: Goal: Adequate nutrition will be maintained Outcome: Progressing   Problem: Coping: Goal: Level of anxiety will decrease Outcome: Progressing   Problem: Elimination: Goal: Will not experience complications related to bowel motility Outcome: Progressing Goal: Will not experience complications related to urinary retention Outcome: Progressing   Problem: Pain Managment: Goal: General experience of comfort will improve Outcome: Progressing   Problem: Safety: Goal: Ability to remain free from injury will improve Outcome: Progressing   Problem: Skin Integrity: Goal: Risk for impaired skin integrity will decrease Outcome: Progressing   Problem: Education: Goal: Knowledge about tracheostomy care/management will improve Outcome: Progressing   Problem: Activity: Goal: Ability to tolerate increased activity will improve Outcome: Progressing   Problem: Health Behavior/Discharge Planning: Goal: Ability to manage tracheostomy will improve Outcome: Progressing   Problem: Respiratory: Goal: Patent airway maintenance will  improve Outcome: Progressing   Problem: Role Relationship: Goal: Ability to communicate will improve Outcome: Progressing   

## 2021-07-13 LAB — BASIC METABOLIC PANEL
Anion gap: 6 (ref 5–15)
BUN: 20 mg/dL (ref 8–23)
CO2: 27 mmol/L (ref 22–32)
Calcium: 9 mg/dL (ref 8.9–10.3)
Chloride: 103 mmol/L (ref 98–111)
Creatinine, Ser: 0.85 mg/dL (ref 0.61–1.24)
GFR, Estimated: >60 mL/min (ref >60)
Glucose, Bld: 103 mg/dL — ABNORMAL HIGH (ref 70–99)
Potassium: 4.4 mmol/L (ref 3.5–5.1)
Sodium: 136 mmol/L (ref 135–145)

## 2021-07-13 LAB — GLUCOSE, CAPILLARY
Glucose-Capillary: 101 mg/dL — ABNORMAL HIGH (ref 70–99)
Glucose-Capillary: 96 mg/dL (ref 70–99)
Glucose-Capillary: 85 mg/dL (ref 70–99)
Glucose-Capillary: 81 mg/dL (ref 70–99)
Glucose-Capillary: 77 mg/dL (ref 70–99)

## 2021-07-13 LAB — CBC
HCT: 33.8 % — ABNORMAL LOW (ref 39.0–52.0)
Hemoglobin: 10.8 g/dL — ABNORMAL LOW (ref 13.0–17.0)
MCH: 30.2 pg (ref 26.0–34.0)
MCHC: 32 g/dL (ref 30.0–36.0)
MCV: 94.4 fL (ref 80.0–100.0)
Platelets: 185 10*3/uL (ref 150–400)
RBC: 3.58 MIL/uL — ABNORMAL LOW (ref 4.22–5.81)
RDW: 13.2 % (ref 11.5–15.5)
WBC: 10.1 10*3/uL (ref 4.0–10.5)
nRBC: 0 % (ref 0.0–0.2)

## 2021-07-13 NOTE — Progress Notes (Signed)
PROGRESS NOTE  Christopher Santiago FBP:[id number] DOB: 1931/11/16 DOA: 07/01/2021 PCP: Tonia Ghent, MD   LOS: 12 days   Brief Narrative / Interim history: 87 year old with past medical history significant for COPD, quit smoking 30 years ago, vocal cord cancer, diabetes who presents complaining of exertional dyspnea, reports sinusitis, postnasal drip, hoarseness for the past 3 to 4 months. He was also complaining of difficulty swallowing, food gets stuck in the throat, he also report phlegm, in his throat. CT maxillofacial, soft tissue neck: Asymmetric soft tissue density/fullness at the base of the right aryepiglottic fold, extending into the right piriform sinus with possible involvement of the right glottis inferiorly.  If echo to measure or define a discrete mass within this region given lack of IV contrast. ENT consulted for direct visualization.  Patient underwent  laryngoscopy with biopsy and tracheostomy 8/10.   Subjective / 24h Interval events: Is alert, has mittens on as he gets confused at times and goes after he is IV/trach  Assessment & Plan: Principal Problem Acute Hypoxic Respiratory Failure -Suspect related to component of aspiration due to dysphagia larynx CA, COPD exacerbation -He has completed 5 days of IV steroids, wheezing is improved -Currently on antibiotics with Vanco and cefepime, respiratory culture with normal respiratory flora, narrowed to cefepime alone and completed 7 days with last day 8/21 -Continue Mucomyst 3 times daily -ENT managing trach care -If continues to be unable to tolerate chest PT we will discontinue -Improving secretions  Active Problems Squamous cell carcinoma Larynx-CT: With asymmetric soft tissue density fullness at the base of the right aryepiglottic fold: ENT consulted.  Patient underwent laryngoscopy with biopsy and tracheostomy 8/11. Stable on ATC 5 L oxygen.  -Biopsy showed squamous cell carcinoma with foci suspicious for invasion.  -He  was discussed at tumor board, will have outpatient follow-up with medical oncology, radiation oncology.  CT scan of the neck and chest showing that the pulmonary nodules have not progressed since 2019 therefore benign -Trach changed to [place] 6 Prox XLT Cuffless.   Acute metabolic encephalopathy- intermittent, in the setting of acute illness/hospitalization.  Ativan as needed today  1.2 cm spiculated right upper lobe nodule-outpatient follow up   Left Renal mass -Concerning for renal carcinoma, will need to follow-up with urology as an outpatient   Diabetes mellitus - Not on medication. Continue SSI  HTN -Resume metoprolol, Continue with Norvasc.    Leukocytosis probably related to steroids vs infection.  Resolved   Nutrition-tube feeding stopped as patient discontinued his core track.  Placed on IV fluids.  Consult IR for PEG tube.  WBC are now normalized.  Place core track tomorrow  Scheduled Meds:  acetaminophen  500 mg Per Tube TID   allopurinol  100 mg Oral Daily   amLODipine  5 mg Oral Daily   arformoterol  15 mcg Nebulization BID   budesonide (PULMICORT) nebulizer solution  0.25 mg Nebulization BID   chlorhexidine gluconate (MEDLINE KIT)  15 mL Mouth Rinse BID   Chlorhexidine Gluconate Cloth  6 each Topical Daily   feeding supplement (PROSource TF)  45 mL Per Tube BID   free water  150 mL Per Tube Q4H   guaiFENesin  10 mL Oral Q8H   insulin aspart  0-5 Units Subcutaneous QHS   insulin aspart  0-9 Units Subcutaneous TID WC   lidocaine  1 application Urethral Once   mouth rinse  15 mL Mouth Rinse 10 times per day   metoprolol tartrate  12.5 mg Oral Daily  nystatin  5 mL Oral QID   revefenacin  175 mcg Nebulization Daily   scopolamine  1 patch Transdermal Q72H   sennosides  5 mL Oral BID   sodium chloride flush  10-40 mL Intracatheter Q12H   Continuous Infusions:  ceFEPime (MAXIPIME) IV 2 g (07/13/21 1047)   dextrose 5 % and 0.9% NaCl 50 mL/hr at 07/12/21 1346   feeding  supplement (OSMOLITE 1.5 CAL) Stopped (07/12/21 0700)   PRN Meds:.albuterol, bisacodyl, hydrALAZINE, lip balm, LORazepam, sodium chloride flush, traMADol  Diet Orders (From admission, onward)     Start     Ordered   07/03/21 1433  Diet NPO time specified Except for: Ice Chips  Diet effective now       Comments: Occasional ice chips after oral care  Question:  Except for  Answer:  Ice Chips   07/03/21 1432            DVT prophylaxis: Place and maintain sequential compression device Start: 07/04/21 1027     Code Status: DNR  Family Communication: Daughter at bedside  Status is: Inpatient  Remains inpatient appropriate because:IV treatments appropriate due to intensity of illness or inability to take PO  Dispo: The patient is from: Home              Anticipated d/c is to: Home              Patient currently is not medically stable to d/c.   Difficult to place patient No  Level of care: Progressive  Consultants:  ENT  Procedures:  Laryngoscopy / trach  Microbiology  none  Antimicrobials: Cefepime    Objective: Vitals:   07/13/21 0615 07/13/21 0745 07/13/21 0819 07/13/21 1106  BP: (!) 153/65 (!) 153/65 (!) 135/53 (!) 135/53  Pulse: 78 76 74 68  Resp: 17 (!) 22 (!) 24 19  Temp:   98.7 F (37.1 C)   TempSrc:   Axillary   SpO2: 96% 98% 97% 97%  Weight:      Height:        Intake/Output Summary (Last 24 hours) at 07/13/2021 1310 Last data filed at 07/13/2021 7681 Gross per 24 hour  Intake 352.69 ml  Output 1500 ml  Net -1147.31 ml    Filed Weights   07/07/21 0501 07/08/21 0505 07/11/21 0506  Weight: 71.6 kg 71.8 kg 70.6 kg    Examination:  Constitutional: Alert, confused Eyes: Anicteric ENMT: mmm Neck: normal, supple Respiratory: No wheezing, no crackles, coarse breath sounds that are clearing with coughing Cardiovascular: Regular rate and rhythm, no murmurs, no edema Abdomen: Soft, NT, ND, bowel sounds positive Musculoskeletal: no clubbing /  cyanosis.  Skin: No rashes seen Neurologic: Nonfocal  Data Reviewed: I have independently reviewed following labs and imaging studies   CBC: Recent Labs  Lab 07/08/21 1143 07/09/21 0312 07/11/21 0304 07/12/21 0124 07/13/21 0321  WBC 12.1* 14.3* 10.1 9.4 10.1  HGB 11.8* 12.2* 11.1* 10.6* 10.8*  HCT 38.0* 39.2 35.1* 32.4* 33.8*  MCV 95.7 95.4 93.9 93.1 94.4  PLT 133* 131* 156 157 157    Basic Metabolic Panel: Recent Labs  Lab 07/08/21 1258 07/09/21 0312 07/11/21 0304 07/12/21 0124 07/13/21 0321  NA 136 134* 135 131* 136  K 5.1 4.6 4.9 4.7 4.4  CL 104 101 102 100 103  CO2 29 27 26 26 27   GLUCOSE 150* 125* 159* 141* 103*  BUN 26* 24* 26* 26* 20  CREATININE 0.78 0.81 0.83 0.83 0.85  CALCIUM 8.9 9.0  9.1 8.8* 9.0    Liver Function Tests: Recent Labs  Lab 07/11/21 0304  AST 13*  ALT 14  ALKPHOS 56  BILITOT 0.3  PROT 6.1*  ALBUMIN 2.4*    Coagulation Profile: No results for input(s): INR, PROTIME in the last 168 hours. HbA1C: No results for input(s): HGBA1C in the last 72 hours. CBG: Recent Labs  Lab 07/12/21 1715 07/12/21 2316 07/13/21 0416 07/13/21 0759 07/13/21 1145  GLUCAP 84 94 101* 96 85     Recent Results (from the past 240 hour(s))  Culture, Respiratory w Gram Stain     Status: None   Collection Time: 07/06/21  2:17 PM   Specimen: Tracheal Aspirate; Respiratory  Result Value Ref Range Status   Specimen Description TRACHEAL ASPIRATE  Final   Special Requests NONE  Final   Gram Stain   Final    RARE WBC PRESENT, PREDOMINANTLY PMN FEW GRAM POSITIVE COCCI IN CLUSTERS RARE GRAM NEGATIVE RODS RARE GRAM POSITIVE RODS    Culture   Final    FEW Normal respiratory flora-no Staph aureus or Pseudomonas seen Performed at [facility], [address], [place], [place] [address]    Report Status 07/08/2021 FINAL  Final      Radiology Studies: No results found.    Marzetta Board, MD, PhD [facility]  Between 7 am - 7 pm I am  available, please contact me via Amion (for emergencies) or Securechat (non urgent messages)  Between 7 pm - 7 am I am not available, please contact night coverage MD/APP via Amion

## 2021-07-13 NOTE — Plan of Care (Signed)
  Problem: Education: Goal: Knowledge of General Education information will improve Description: Including pain rating scale, medication(s)/side effects and non-pharmacologic comfort measures Outcome: Progressing   Problem: Health Behavior/Discharge Planning: Goal: Ability to manage health-related needs will improve Outcome: Progressing   Problem: Clinical Measurements: Goal: Ability to maintain clinical measurements within normal limits will improve Outcome: Progressing Goal: Will remain free from infection Outcome: Progressing Goal: Diagnostic test results will improve Outcome: Progressing Goal: Respiratory complications will improve Outcome: Progressing Goal: Cardiovascular complication will be avoided Outcome: Progressing   Problem: Activity: Goal: Risk for activity intolerance will decrease Outcome: Progressing   Problem: Nutrition: Goal: Adequate nutrition will be maintained Outcome: Progressing   Problem: Coping: Goal: Level of anxiety will decrease Outcome: Progressing   Problem: Elimination: Goal: Will not experience complications related to bowel motility Outcome: Progressing Goal: Will not experience complications related to urinary retention Outcome: Progressing   Problem: Pain Managment: Goal: General experience of comfort will improve Outcome: Progressing   Problem: Safety: Goal: Ability to remain free from injury will improve Outcome: Progressing   Problem: Skin Integrity: Goal: Risk for impaired skin integrity will decrease Outcome: Progressing   Problem: Education: Goal: Knowledge about tracheostomy care/management will improve Outcome: Progressing   Problem: Activity: Goal: Ability to tolerate increased activity will improve Outcome: Progressing   Problem: Health Behavior/Discharge Planning: Goal: Ability to manage tracheostomy will improve Outcome: Progressing   Problem: Respiratory: Goal: Patent airway maintenance will  improve Outcome: Progressing   Problem: Role Relationship: Goal: Ability to communicate will improve Outcome: Progressing   Problem: Safety: Goal: Non-violent Restraint(s) Outcome: Progressing

## 2021-07-13 NOTE — Progress Notes (Addendum)
Pharmacy Antibiotic Note  Christopher Santiago is a 87 y.o. male with respiratory failure, possible PNA.  Pharmacy was consulted  on 07/07/21 for Vancomycin and Cefepime dosing.   He has completed 5 days of IV steroids, MD noted wheezing is improved.  Respiratory culture with normal respiratory flora.  MRSA PCR was negative. Antibiotics narrowed to cefepime alone. Continues on Cefepime and MD noted patient still has significant secretions. Plans to discontinue cefepime today.  Plan: Finish Cefepime to 2g IV q12hr  Make sure discontinue abx as appropriate Monitor clinical status, renal function and culture results daily.    Height: 5\' 6"  (167.6 cm) Weight: 70.6 kg (155 lb 10.3 oz) IBW/kg (Calculated) : 63.8  Temp (24hrs), Avg:98.4 F (36.9 C), Min:98.2 F (36.8 C), Max:98.7 F (37.1 C)  Recent Labs  Lab 07/08/21 1143 07/08/21 1258 07/09/21 0312 07/11/21 0304 07/12/21 0124 07/13/21 0321  WBC 12.1*  --  14.3* 10.1 9.4 10.1  CREATININE  --  0.78 0.81 0.83 0.83 0.85     Estimated Creatinine Clearance: 54.2 mL/min (by C-G formula based on SCr of 0.85 mg/dL).    Allergies  Allergen Reactions   Tessalon [Benzonatate] Other (See Comments)    Ineffective, nasal congestion    Antibiotics:  CTX 8/10>>8/15  Vancomycin 8/15>>8/17 Cefepime 8/15>>   Microbiology:  8/14: TA: normal flora, no SA, PSA 8/10 MRSA PCR  >> negative 8/9 covid neg, flu neg   Varney Daily, PharmD PGY1 Pharmacy Resident  Please check AMION for all [facility] pharmacy phone numbers After 10:00 PM call main pharmacy [phone]

## 2021-07-14 ENCOUNTER — Inpatient Hospital Stay (HOSPITAL_BASED_OUTPATIENT_CLINIC_OR_DEPARTMENT_OTHER): Payer: Medicare Other

## 2021-07-14 ENCOUNTER — Inpatient Hospital Stay (HOSPITAL_COMMUNITY): Payer: Medicare Other

## 2021-07-14 DIAGNOSIS — R9431 Abnormal electrocardiogram [ECG] [EKG]: Secondary | ICD-10-CM

## 2021-07-14 LAB — GLUCOSE, CAPILLARY
Glucose-Capillary: 67 mg/dL — ABNORMAL LOW (ref 70–99)
Glucose-Capillary: 69 mg/dL — ABNORMAL LOW (ref 70–99)
Glucose-Capillary: 77 mg/dL (ref 70–99)
Glucose-Capillary: 56 mg/dL — ABNORMAL LOW (ref 70–99)
Glucose-Capillary: 70 mg/dL (ref 70–99)
Glucose-Capillary: 126 mg/dL — ABNORMAL HIGH (ref 70–99)

## 2021-07-14 LAB — ECHOCARDIOGRAM COMPLETE
Area-P 1/2: 3.15 cm2
Height: 66 in
Weight: 2490.32 oz

## 2021-07-14 MED ORDER — PERFLUTREN LIPID MICROSPHERE
1.0000 mL | INTRAVENOUS | Status: AC | PRN
  Administered 2021-07-14: 2 mL via INTRAVENOUS
  Filled 2021-07-14: qty 10

## 2021-07-14 MED ORDER — DEXTROSE 50 % IV SOLN
INTRAVENOUS | Status: AC
  Administered 2021-07-14: 50 mL
  Filled 2021-07-14: qty 50

## 2021-07-14 NOTE — Progress Notes (Signed)
Inpatient Diabetes Program Recommendations  AACE/ADA: New Consensus Statement on Inpatient Glycemic Control (2015)  Target Ranges:  Prepandial:   less than 140 mg/dL      Peak postprandial:   less than 180 mg/dL (1-2 hours)      Critically ill patients:  140 - 180 mg/dL   Lab Results  Component Value Date   GLUCAP 69 (L) 07/14/2021   HGBA1C 5.8 (H) 07/05/2021    Review of Glycemic Control Results for Christopher Santiago, Christopher Santiago (MRN [id number]) as of 07/14/2021 11:09  Ref. Range 07/12/2021 11:48 07/12/2021 17:15 07/12/2021 23:16 07/13/2021 04:16 07/13/2021 07:59 07/13/2021 11:45 07/13/2021 15:41 07/13/2021 23:10 07/14/2021 03:17 07/14/2021 07:41  Glucose-Capillary Latest Ref Range: 70 - 99 mg/dL 138 (H) 84 94 101 (H) 96 85 81 77 67 (L) 69 (L)   Diabetes history: None Current orders for Inpatient glycemic control:  Novolog sensitive tid with meals and HS  Inpatient Diabetes Program Recommendations:    Note feeding tube pulled out of 07/14/21.  Patient to have Cortrack replaced today.  Consider d/c of Novolog and correction if appropriate?  CBG's with feeds were mostly less than 150 mg/dL.   Thanks,  Adah Perl, RN, BC-ADM Inpatient Diabetes Coordinator Pager [phone]  (8a-5p)

## 2021-07-14 NOTE — Procedures (Signed)
Cortrak  Person Inserting Tube:  Giannis Corpuz, Creola Corn, RD Tube Type:  Cortrak - 43 inches Tube Size:  10 Tube Location:  Left nare Initial Placement:  Stomach Secured by: Bridle Technique Used to Measure Tube Placement:  Marking at nare/corner of mouth Cortrak Secured At:  70 cm Cortrak Tube Team Note:  Consult received to place a Cortrak feeding tube.   X-ray is required, abdominal x-ray has been ordered by the Cortrak team. Please confirm tube placement before using the Cortrak tube.   If the tube becomes dislodged please keep the tube and contact the Cortrak team at [email or web address] ([email or web address]) for replacement.  If after hours and replacement cannot be delayed, place a NG tube and confirm placement with an abdominal x-ray.    Larkin Ina, MS, RD, LDN (she/her/hers) RD pager number and weekend/on-call pager number located in [place].

## 2021-07-14 NOTE — Progress Notes (Addendum)
IR was requested for image guided G tube placemen.   Case was reviewed by Dr. Vernard Gambles last week who stated that there is a risk of colon interposition thus patient will need barium two days prior to the placement and will need to be re-evaluated under fluoro. Case was re-evaluated by Dr. Kathlene Cote and Dr. Laurence Ferrari, patient is not a candidate for percutaneous G tube placement.  Surgery consult is recommended.    Ordering provider notified. Daughter was called to update, left a VM with call back number.   ADDENDUM: Spoke with daughter Ms. Derry Lory via telephone, updated her that patient is not a candidate for percutaneous G tube placement.  She verbalized understanding.     Will delete the G tube placement order.  Please call IR for questions and concerns.   Armando Gang Sindy Mccune PA-C 07/14/2021 9:49 AM

## 2021-07-14 NOTE — TOC Initial Note (Signed)
Transition of Care [facility]) - Initial/Assessment Note    Patient Details  Name: Christopher Santiago MRN: [id number] Date of Birth: 07/30/1931  [facility]) CM/SW Contact:    [name], LCSW Phone Number: 07/14/2021, 2:57 PM  Clinical Narrative:    CSW met with pt and daughter [name] in room.  Permission given to speak with daughter present, also permission to communicate with wife [name].  Discussed DC plan of SNF and CSW shared that SNF facilities will not consider pt until trach has been in place 30 days, which they were not aware of.  Pt lives with wife who has significant disabilities requiring [place] aide assistance for her 3x week.  Pt has historically been caregiver to his wife.  SNF choice document given, pt first choice would be [facility].  PCP in place.  DME in home: walker, 3n1, wheelchair.  Pt has been vaccinated for covid with one booster.                  Expected Discharge Plan: Skilled Nursing Facility Barriers to Discharge: Continued Medical Work up, SNF Pending bed offer, Other (must enter comment) (pt has new trach placed on 07/02/21)   Patient Goals and CMS Choice Patient states their goals for this hospitalization and ongoing recovery are:: "get back home to take care of my wife" CMS Medicare.gov Compare Post Acute Care list provided to:: Patient Represenative (must comment) Choice offered to / list presented to : Adult Children  Expected Discharge Plan and Services Expected Discharge Plan: [place] Choice: [place] arrangements for the past 2 months: Single Family Home                                      Prior Living Arrangements/Services Living arrangements for the past 2 months: Single Family Home Lives with:: Spouse Patient language and need for interpreter reviewed:: Yes Do you feel safe going back to the place where you live?: Yes      Need for Family Participation in  Patient Care: Yes (Comment) Care giver support system in place?: Yes (comment) Current home services: Other (comment) (na) Criminal Activity/Legal Involvement Pertinent to Current Situation/Hospitalization: No - Comment as needed  Activities of Daily Living Home Assistive Devices/Equipment: Eyeglasses, Hearing aid ADL Screening (condition at time of admission) Patient's cognitive ability adequate to safely complete daily activities?: Yes Is the patient deaf or have difficulty hearing?: Yes Does the patient have difficulty seeing, even when wearing glasses/contacts?: No Does the patient have difficulty concentrating, remembering, or making decisions?: Yes Patient able to express need for assistance with ADLs?: Yes Does the patient have difficulty dressing or bathing?: Yes Independently performs ADLs?: No Does the patient have difficulty walking or climbing stairs?: Yes Weakness of Legs: Both Weakness of Arms/Hands: Both  Permission Sought/Granted Permission sought to share information with : Family Supports Permission granted to share information with : Yes, Verbal Permission Granted  Share Information with NAME: wife [name], daughter [name]  Permission granted to share info w AGENCY: SNF        Emotional Assessment Appearance:: Appears stated age Attitude/Demeanor/Rapport: Engaged Affect (typically observed): Appropriate Orientation: : Oriented to Self Alcohol / Substance Use: Not Applicable Psych Involvement: No (comment)  Admission diagnosis:  Respiratory failure, acute ([place]) [J96.00] Renal mass [N28.89] Acute respiratory failure with hypoxia ([place]) [J96.01] Acute  on chronic respiratory failure with hypoxia ([facility]) [J96.21] Patient Active Problem List   Diagnosis Date Noted   Dementia ([place]) 07/12/2021   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   Tracheostomy status ([place])    Tracheobronchitis    Protein-calorie malnutrition, severe 07/03/2021   Left renal mass  07/02/2021   Respiratory failure, acute ([place]) 07/02/2021   Acute respiratory failure with hypoxia ([place]) 07/01/2021   Back pain 06/05/2020   Constipation 06/05/2020   Memory change 07/03/2019   BPH with urinary obstruction 11/10/2018   Bronchiectasis ([place]) 08/11/2018   Anemia 08/11/2018   Osteoarthritis of right hip 04/11/2018   Ascending aorta dilation ([place]) 03/09/2018   Pulmonary hypertension, unspecified ([place]) 03/09/2018   Chronic diastolic CHF (congestive heart failure) ([place]) 03/09/2018   Chronic cough 06/08/2016   Alcohol use 02/20/2016   Mixed hyperlipidemia 02/10/2016   SOB (shortness of breath) 08/26/2015   Bradycardia 08/26/2015   Acute pulmonary embolism ([place]) 11/19/2014   COPD with acute exacerbation ([place]) 12/27/2012   Biceps tendonitis 07/12/2012   Medicare annual wellness visit, subsequent 02/04/2012   Advance directive discussed with patient 02/04/2012   PVD (peripheral vascular disease) ([place]) 12/22/2011   Rhinitis 01/29/2011   CERVICAL STRAIN, ACUTE 04/08/2010   Hypothyroidism 01/17/2009   CARCINOMA, SKIN, SQUAMOUS CELL 01/08/2009   Weakness 12/27/2008   Depression 08/22/2008   Gout 12/12/2007   OBESITY 09/24/2007   Atherosclerosis of native coronary artery of native heart with stable angina pectoris ([place]) 06/16/2007   ERECTILE DYSFUNCTION 05/07/2007   OSA on CPAP 05/05/2007   Essential hypertension 05/05/2007   Coronary atherosclerosis 05/05/2007   SLEEP APNEA 05/05/2007   Hyperglycemia 04/05/2007   PCP:  Tonia Ghent, MD Pharmacy:   [facility], [address] [place] [address] Phone: [phone] Fax: [phone]     Social Determinants of Health (SDOH) Interventions    Readmission Risk Interventions No flowsheet data found.

## 2021-07-14 NOTE — Progress Notes (Signed)
  Echocardiogram 2D Echocardiogram has been performed.  Christopher Santiago 07/14/2021, 10:10 AM

## 2021-07-14 NOTE — Progress Notes (Signed)
Pt sugar is 71. MD notified.

## 2021-07-14 NOTE — Progress Notes (Signed)
Occupational Therapy Treatment Patient Details Name: Christopher Santiago MRN: [id number] DOB: Sep 21, 1931 Today's Date: 07/14/2021    History of present illness Pt is an 87 y/o male admitted secondary to worsening SOB, especially with exertion. Found to have acute respiratory failure with hypoxia. Imaging showed fullness of the right aryepiglottic fold extending into the right pyriform and L renal mass. Pt had Flexible laryngoscopy on 8/10. Trach in place. Workup pending. PMH includes vocal cord cancer, COPD, CAD, DM, HTN, and R THA.   OT comments  Patient making great progress toward goals this date. Speaking valve in place upon entry as patient working with SLP. Handoff to this OT for treatment session. Patient completes bed mobility, functional transfers and mobility with use of RW and Min guard to Min A. Patient would benefit from continued acute OT services in prep for safe d/c to next level of care. OT will continue to follow acutely.    Follow Up Recommendations  SNF    Equipment Recommendations  Other (comment) (Defer to next level of care.)    Recommendations for Other Services      Precautions / Restrictions Precautions Precautions: Fall Precaution Comments: Watch O2. Restrictions Weight Bearing Restrictions: No       Mobility Bed Mobility Overal bed mobility: Needs Assistance Bed Mobility: Supine to Sit     Supine to sit: Min assist     General bed mobility comments: Min A to elevate trunk. Able to advance BLE toward EOB without external assist.    Transfers Overall transfer level: Needs assistance Equipment used: Rolling walker (2 wheeled) Transfers: Sit to/from Stand Sit to Stand: Min assist         General transfer comment: Light Min A for sit to stand from EOB with cues for hand placement. Second sit to stand from recliner with light Min A.    Balance Overall balance assessment: Needs assistance Sitting-balance support: No upper extremity supported;Feet  supported Sitting balance-Leahy Scale: Fair Sitting balance - Comments: Patient sat at EOB for 5-8 min with supervision A.   Standing balance support: Bilateral upper extremity supported;During functional activity Standing balance-Leahy Scale: Poor Standing balance comment: Reliant on BUE on RW with static/dynamic balance.                           ADL either performed or assessed with clinical judgement   ADL Overall ADL's : Needs assistance/impaired     Grooming: Set up;Sitting Grooming Details (indicate cue type and reason): Washed face with 1 cue for continuation of task.                 Toilet Transfer: RW;Min Psychiatric nurse Details (indicate cue type and reason): Simulated with transfer to recliner with use of RW.                 Vision       Perception     Praxis      Cognition Arousal/Alertness: Awake/alert Behavior During Therapy: WFL for tasks assessed/performed Overall Cognitive Status: Difficult to assess                                 General Comments: Alert/awake with good participation; very motivated this date.        Exercises General Exercises - Lower Extremity Quad Sets: Strengthening;Both;10 reps;Seated   Shoulder Instructions       General Comments  SpO2 70's with mobility on RA (FiO2 28%). Presumed poor reading as SpO2 increased to 99% when R hand lifted off RW.    Pertinent Vitals/ Pain       Pain Assessment: No/denies pain  Home Living                                          Prior Functioning/Environment              Frequency  Min 2X/week        Progress Toward Goals  OT Goals(current goals can now be found in the care plan section)  Progress towards OT goals: Progressing toward goals  Acute Rehab OT Goals Patient Stated Goal: Per wife, for patient to get better. OT Goal Formulation: With patient/family Time For Goal Achievement: 07/17/21 Potential to  Achieve Goals: Fair ADL Goals Pt Will Perform Eating: Independently Pt Will Perform Grooming: with modified independence;standing Pt Will Perform Upper Body Dressing: Independently Pt Will Perform Lower Body Dressing: with modified independence;sit to/from stand Pt Will Transfer to Toilet: with modified independence;ambulating Pt Will Perform Toileting - Clothing Manipulation and hygiene: with modified independence;sit to/from stand Pt Will Perform Tub/Shower Transfer: ambulating;rolling walker;Tub transfer;Shower transfer Pt/caregiver will Perform Home Exercise Program: Increased ROM;Increased strength;Both right and left upper extremity  Plan Discharge plan needs to be updated;Frequency remains appropriate    Co-evaluation                 AM-PAC OT "6 Clicks" Daily Activity     Outcome Measure   Help from another person eating meals?: Total Help from another person taking care of personal grooming?: A Little Help from another person toileting, which includes using toliet, bedpan, or urinal?: A Little Help from another person bathing (including washing, rinsing, drying)?: A Lot Help from another person to put on and taking off regular upper body clothing?: A Little Help from another person to put on and taking off regular lower body clothing?: A Lot 6 Click Score: 14    End of Session Equipment Utilized During Treatment: Gait belt;Oxygen;Other (comment);Rolling walker (via trach collar)  OT Visit Diagnosis: Unsteadiness on feet (R26.81);Muscle weakness (generalized) (M62.81)   Activity Tolerance Patient tolerated treatment well   Patient Left in chair;with call bell/phone within reach;with chair alarm set   Nurse Communication Mobility status;Other (comment)        Time: 2924-4628 OT Time Calculation (min): 30 min  Charges: OT General Charges $OT Visit: 1 Visit OT Treatments $Self Care/Home Management : 8-22 mins $Therapeutic Activity: 8-22 mins  Daphyne Miguez H.  OTR/L Supplemental OT, Department of rehab services [phone]   Jacquelynn Friend R [place] 07/14/2021, 11:05 AM

## 2021-07-14 NOTE — Progress Notes (Signed)
Nutrition Follow-up  DOCUMENTATION CODES:  Severe malnutrition in context of chronic illness  INTERVENTION:  Once Cortrak is replaced, restart TF: -Osmolite 1.5 cal @ 55 ml/hr (1320 ml/day) -ProSource TF 45 ml BID -168ml free water flushes Q4H   Provides 2060 kcal, 105 grams of protein, and 1006 ml of H2O (1906 ml free water total with flushes)  NUTRITION DIAGNOSIS:  Severe Malnutrition related to chronic illness (COPD) as evidenced by severe fat depletion, severe muscle depletion. - ongoing  GOAL:  Patient will meet greater than or equal to 90% of their needs - meeting with TF  MONITOR:  Diet advancement, Labs, Weight trends, Skin, I & O's  REASON FOR ASSESSMENT:  Consult Assessment of nutrition requirement/status  ASSESSMENT:  87 year old male who presented to the ED on 8/09 with SOB and dysphagia. PMH of COPD, CAD, depression, DM, HTN, HLD, laryngeal cancer treated ~30 years ago, OSA. 8/10 - s/p laryngeal biopsy and tracheostomy 8/12 - Cortrak placed, tip of tube in stomach 8/15 - trach exchanged to #6 XLT-P uncuffed 8/20 - Cortrak pulled by patient  Cortrak to be placed again today. TF currently held due to pt pulling tube out on Saturday, 8/20.  Current TF order: Osmolite 1.5 cal @ 55 ml/hr (1320 ml/day) ProSource TF 45 ml BID 143ml free water flushes Q4H Provides 2060 kcal, 105 grams of protein, and 1006 ml of H2O (1906 ml free water total with flushes)  Pt awaiting PEG tube placement. Consult placed with IR. Per IR, "Case was reviewed by Dr. Vernard Gambles last week who stated that there is a risk of colon interposition thus patient will need barium two days prior to the placement and will need to be re-evaluated under fluoro. Case was re-evaluated by Dr. Kathlene Cote and Dr. Laurence Ferrari, patient is not a candidate for percutaneous G tube placement. Surgery consult is recommended."  Palliative saw pt. "Family is clear that if interventions are not going to prolong patient's life  in a meaningful way then they would make decisions for comfort only." Pt okay with feeding tube placement.  Medications: reviewed; ProSource TF BID, Free water 150 ml q 4 hrs, SSI, Senokot BID  Labs: reviewed; CBG 67-85  Diet Order:   Diet Order             Diet NPO time specified Except for: Ice Chips  Diet effective now                  EDUCATION NEEDS:  No education needs have been identified at this time  Skin:  Skin Assessment: Reviewed RN Assessment Skin Integrity Issues:: Incisions Incisions: neck s/p trach  Last BM:  07/09/21 - Type 3, large  Height:  Ht Readings from Last 1 Encounters:  07/02/21 5\' 6"  (1.676 m)   Weight:  Wt Readings from Last 1 Encounters:  07/11/21 70.6 kg   BMI:  Body mass index is 25.12 kg/m.  Estimated Nutritional Needs:  Kcal:  2000-2200 kcal Protein:  95-115 grams Fluid:  >/= 2 L/day  Derrel Nip, RD, LDN (she/her/hers) Registered Dietitian I After-Hours/Weekend Pager # in [place]

## 2021-07-14 NOTE — Progress Notes (Addendum)
PROGRESS NOTE  Christopher Santiago UUV:[id number] DOB: [birth year] DOA: 07/01/2021 PCP: Tonia Ghent, MD   LOS: 13 days   Brief Narrative / Interim history: 87 year old with past medical history significant for COPD, quit smoking 30 years ago, vocal cord cancer, diabetes who presents complaining of exertional dyspnea, reports sinusitis, postnasal drip, hoarseness for the past 3 to 4 months. He was also complaining of difficulty swallowing, food gets stuck in the throat, he also report phlegm, in his throat. CT maxillofacial, soft tissue neck: Asymmetric soft tissue density/fullness at the base of the right aryepiglottic fold, extending into the right piriform sinus with possible involvement of the right glottis inferiorly.  If echo to measure or define a discrete mass within this region given lack of IV contrast. ENT consulted for direct visualization.  Patient underwent  laryngoscopy with biopsy and tracheostomy 8/10.   Subjective / 24h Interval events: I alert this morning, has mittens on.  No significant overnight events.  Assessment & Plan: Principal Problem Acute Hypoxic Respiratory Failure -Suspect related to component of aspiration due to dysphagia larynx CA, COPD exacerbation.  He has completed 5 days of IV steroids as well as 7 days of cefepime with last day 8/21, respiratory status has remained stable and wheezing is improved. -respiratory culture with normal respiratory flora -Continue Mucomyst 3 times daily -ENT managing trach care -Secretions overall improving  Active Problems Squamous cell carcinoma Larynx-CT: With asymmetric soft tissue density fullness at the base of the right aryepiglottic fold: ENT consulted.  Patient underwent laryngoscopy with biopsy and tracheostomy 8/11. Stable on ATC 5 L oxygen.  -Biopsy showed squamous cell carcinoma with foci suspicious for invasion.  -He was discussed at tumor board, will have outpatient follow-up with medical oncology, radiation  oncology.  CT scan of the neck and chest showing that the pulmonary nodules have not progressed since 2019 therefore benign -Trach changed to Shiley 6 Prox XLT Cuffless.   Nutrition-tube feeding stopped as patient removed his tube. He will have PEG placement tomorrow by surgery. His Lyndel Safe perioperative risk is 3.7% given systemic disease, age.  His 2D echo is showed normal EF.  Okay to go for surgery with the above risk, no further testing needed  Acute metabolic encephalopathy- intermittent, in the setting of acute illness/hospitalization.  Ativan as needed, hold if too sedated  1.2 cm spiculated right upper lobe nodule-outpatient follow up   Left Renal mass -Concerning for renal carcinoma, will need to follow-up with urology as an outpatient   Diabetes mellitus - Not on medication. Continue SSI  HTN -Resume metoprolol, Continue with Norvasc.    Leukocytosis probably related to steroids vs infection.  Resolved    Scheduled Meds:  acetaminophen  500 mg Per Tube TID   allopurinol  100 mg Oral Daily   amLODipine  5 mg Oral Daily   arformoterol  15 mcg Nebulization BID   budesonide (PULMICORT) nebulizer solution  0.25 mg Nebulization BID   chlorhexidine gluconate (MEDLINE KIT)  15 mL Mouth Rinse BID   Chlorhexidine Gluconate Cloth  6 each Topical Daily   feeding supplement (PROSource TF)  45 mL Per Tube BID   free water  150 mL Per Tube Q4H   guaiFENesin  10 mL Oral Q8H   insulin aspart  0-5 Units Subcutaneous QHS   insulin aspart  0-9 Units Subcutaneous TID WC   lidocaine  1 application Urethral Once   mouth rinse  15 mL Mouth Rinse 10 times per day   nystatin  5 mL Oral QID   revefenacin  175 mcg Nebulization Daily   scopolamine  1 patch Transdermal Q72H   sennosides  5 mL Oral BID   sodium chloride flush  10-40 mL Intracatheter Q12H   Continuous Infusions:  ceFEPime (MAXIPIME) IV Stopped (07/13/21 2230)   dextrose 5 % and 0.9% NaCl 50 mL/hr at 07/13/21 1526   feeding  supplement (OSMOLITE 1.5 CAL) Stopped (07/12/21 0700)   PRN Meds:.albuterol, bisacodyl, hydrALAZINE, lip balm, LORazepam, sodium chloride flush, traMADol  Diet Orders (From admission, onward)     Start     Ordered   07/03/21 1433  Diet NPO time specified Except for: Ice Chips  Diet effective now       Comments: Occasional ice chips after oral care  Question:  Except for  Answer:  Ice Chips   07/03/21 1432            DVT prophylaxis: Place and maintain sequential compression device Start: 07/04/21 1027     Code Status: DNR  Family Communication: Daughter at bedside  Status is: Inpatient  Remains inpatient appropriate because:IV treatments appropriate due to intensity of illness or inability to take PO  Dispo: The patient is from: Home              Anticipated d/c is to: Home              Patient currently is not medically stable to d/c.   Difficult to place patient No  Level of care: Progressive  Consultants:  ENT  Procedures:  Laryngoscopy / trach 2d echo IMPRESSIONS   1. Technically difficult study, very poor visualization of left ventricle even after contrast administration. Left ventricular ejection fraction, by estimation, is 55 to 60%. The left ventricle has grossly normal function but very poorly visualized. Left ventricular endocardial border not optimally defined to evaluate regional wall motion. Left ventricular diastolic parameters are indeterminate.   2. Right ventricular systolic function is normal. The right ventricular size is mildly enlarged. Tricuspid regurgitation signal is inadequate for assessing PA pressure.   3. Right atrial size was moderately dilated.   4. A small pericardial effusion is present. The pericardial effusion is anterior to the right ventricle. Presence of pericardial fat pad.   5. The mitral valve is grossly normal. No evidence of mitral valve regurgitation.   6. The aortic valve was not well visualized. Aortic valve regurgitation is  not visualized. No aortic stenosis is present.   7. The inferior vena cava is normal in size with greater than 50% respiratory variability, suggesting right atrial pressure of 3 mmHg.   Microbiology  none  Antimicrobials: None   Objective: Vitals:   07/14/21 0321 07/14/21 0332 07/14/21 0739 07/14/21 0743  BP:  (!) 146/55  (!) 186/52  Pulse:  63 87 (!) 58  Resp:  20 (!) 21 20  Temp: 97.6 F (36.4 C)   (!) 97.5 F (36.4 C)  TempSrc: Axillary   Axillary  SpO2:  97% 99% 100%  Weight:      Height:        Intake/Output Summary (Last 24 hours) at 07/14/2021 0938 Last data filed at 07/13/2021 1747 Gross per 24 hour  Intake --  Output 650 ml  Net -650 ml    Filed Weights   07/07/21 0501 07/08/21 0505 07/11/21 0506  Weight: 71.6 kg 71.8 kg 70.6 kg    Examination:  Constitutional: Alert, confused Eyes: no icterus ENMT: mmm Neck: normal, supple Respiratory: No wheezing, no  crackles, coarse breath sounds Cardiovascular: Regular rate and rhythm, no murmurs, no peripheral edema Abdomen: Soft, NT, ND, bowel sounds positive Musculoskeletal: no clubbing / cyanosis.  Skin: No rashes seen Neurologic: No focal deficits  Data Reviewed: I have independently reviewed following labs and imaging studies   CBC: Recent Labs  Lab 07/08/21 1143 07/09/21 0312 07/11/21 0304 07/12/21 0124 07/13/21 0321  WBC 12.1* 14.3* 10.1 9.4 10.1  HGB 11.8* 12.2* 11.1* 10.6* 10.8*  HCT 38.0* 39.2 35.1* 32.4* 33.8*  MCV 95.7 95.4 93.9 93.1 94.4  PLT 133* 131* 156 157 802    Basic Metabolic Panel: Recent Labs  Lab 07/08/21 1258 07/09/21 0312 07/11/21 0304 07/12/21 0124 07/13/21 0321  NA 136 134* 135 131* 136  K 5.1 4.6 4.9 4.7 4.4  CL 104 101 102 100 103  CO2 _0 GLUCOSE 150* 125* 159* 141* 103*  BUN 26* 24* 26* 26* 20  CREATININE 0.78 0.81 0.83 0.83 0.85  CALCIUM 8.9 9.0 9.1 8.8* 9.0    Liver Function Tests: Recent Labs  Lab 07/11/21 0304  AST 13*  ALT 14   ALKPHOS 56  BILITOT 0.3  PROT 6.1*  ALBUMIN 2.4*    Coagulation Profile: No results for input(s): INR, PROTIME in the last 168 hours. HbA1C: No results for input(s): HGBA1C in the last 72 hours. CBG: Recent Labs  Lab 07/13/21 1145 07/13/21 1541 07/13/21 2310 07/14/21 0317 07/14/21 0741  GLUCAP 85 81 77 67* 69*     Recent Results (from the past 240 hour(s))  Culture, Respiratory w Gram Stain     Status: None   Collection Time: 07/06/21  2:17 PM   Specimen: Tracheal Aspirate; Respiratory  Result Value Ref Range Status   Specimen Description TRACHEAL ASPIRATE  Final   Special Requests NONE  Final   Gram Stain   Final    RARE WBC PRESENT, PREDOMINANTLY PMN FEW GRAM POSITIVE COCCI IN CLUSTERS RARE GRAM NEGATIVE RODS RARE GRAM POSITIVE RODS    Culture   Final    FEW Normal respiratory flora-no Staph aureus or Pseudomonas seen Performed at [facility], [address] [place], [place] [address]    Report Status 07/08/2021 FINAL  Final      Radiology Studies: No results found.    Marzetta Board, MD, PhD [facility]  Between 7 am - 7 pm I am available, please contact me via Amion (for emergencies) or Securechat (non urgent messages)  Between 7 pm - 7 am I am not available, please contact night coverage MD/APP via Amion
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
## 2021-07-14 NOTE — Progress Notes (Signed)
  Speech Language Pathology Treatment: Christopher Santiago Speaking valve  Patient Details Name: Christopher Santiago MRN: [id number] DOB: October 26, 1931 Today's Date: 07/14/2021 Time: 2947-6546 SLP Time Calculation (min) (ACUTE ONLY): 14 min  Assessment / Plan / Recommendation Clinical Impression  Pt wore his PMV for at least 10 min, needing more cues initially to initiate phonation. Pt mouthed to SLP "I can't" when asked to talk, but as he heard his voice, he started to initiate and maintain phonation more spontaneously. His voice production does sound strained at times but there was no evidence of back pressure and VS remained stable. Pt would occasionally start to sound wet, but would cough to command and was able to clear secretions orally x1 with assist from SLP to use yankauer. He continues to show signs of reduced secretion management overall though. Would continue NPO status and use of PMV with full supervision. PMV was left in place upon SLP departure as OT was in the room to begin therapy session.    HPI HPI: Pt is an 87 y/o male admitted 8/10 secondary to worsening SOB, especially with exertion. Found to have acute respiratory failure with hypoxia. Imaging showed fullness of the right aryepiglottic fold extending into the right pyriform and L renal mass. Pt had flexible laryngoscopy on 8/10, including trach.  Findings revealed bilateral true vocal folds in paramedian position with normal adduction, restricted abduction causing narrowing of the airway. No evidence of airway obstruction. Mucosal abnormality/ edema of right supraglottis/glottis noted with no exophytic mass lesion, biopsy showed squamous cell carcinoma.  PMH includes vocal cord cancer ~ 30 years ago, COPD, CAD, DM, HTN, R THA, bilateral hearing aids.      SLP Plan  Continue with current plan of care       Recommendations  Diet recommendations: NPO Medication Administration: Via alternative means      Patient may use Passy-Muir Speech  Valve: Intermittently with supervision PMSV Supervision: Full MD: Please consider changing trach tube to : Smaller size         Oral Care Recommendations: Oral care QID Follow up Recommendations: Skilled Nursing facility SLP Visit Diagnosis: Aphonia (R49.1) Plan: Continue with current plan of care       GO                Osie Bond., M.A. [facility] Pager [phone] Office [phone]  07/14/2021, 11:13 AM

## 2021-07-14 NOTE — Plan of Care (Signed)
  Problem: Education: Goal: Knowledge of General Education information will improve Description: Including pain rating scale, medication(s)/side effects and non-pharmacologic comfort measures Outcome: Progressing   Problem: Health Behavior/Discharge Planning: Goal: Ability to manage health-related needs will improve Outcome: Progressing   Problem: Clinical Measurements: Goal: Ability to maintain clinical measurements within normal limits will improve Outcome: Progressing Goal: Will remain free from infection Outcome: Progressing Goal: Diagnostic test results will improve Outcome: Progressing Goal: Respiratory complications will improve Outcome: Progressing Goal: Cardiovascular complication will be avoided Outcome: Progressing   Problem: Activity: Goal: Risk for activity intolerance will decrease Outcome: Progressing   Problem: Nutrition: Goal: Adequate nutrition will be maintained Outcome: Progressing   Problem: Coping: Goal: Level of anxiety will decrease Outcome: Progressing   Problem: Elimination: Goal: Will not experience complications related to bowel motility Outcome: Progressing Goal: Will not experience complications related to urinary retention Outcome: Progressing   Problem: Pain Managment: Goal: General experience of comfort will improve Outcome: Progressing   Problem: Safety: Goal: Ability to remain free from injury will improve Outcome: Progressing   Problem: Skin Integrity: Goal: Risk for impaired skin integrity will decrease Outcome: Progressing   Problem: Education: Goal: Knowledge about tracheostomy care/management will improve Outcome: Progressing   Problem: Activity: Goal: Ability to tolerate increased activity will improve Outcome: Progressing   Problem: Health Behavior/Discharge Planning: Goal: Ability to manage tracheostomy will improve Outcome: Progressing   Problem: Respiratory: Goal: Patent airway maintenance will  improve Outcome: Progressing   Problem: Role Relationship: Goal: Ability to communicate will improve Outcome: Progressing   

## 2021-07-15 ENCOUNTER — Inpatient Hospital Stay (HOSPITAL_COMMUNITY): Payer: Medicare Other | Admitting: Certified Registered Nurse Anesthetist

## 2021-07-15 ENCOUNTER — Encounter (HOSPITAL_COMMUNITY)
Admission: EM | Disposition: A | Discharge status: Home Health | Payer: Self-pay | Source: Home / Self Care | Attending: Internal Medicine

## 2021-07-15 HISTORY — PX: PEG PLACEMENT: SHX5437

## 2021-07-15 LAB — GLUCOSE, CAPILLARY
Glucose-Capillary: 120 mg/dL — ABNORMAL HIGH (ref 70–99)
Glucose-Capillary: 57 mg/dL — ABNORMAL LOW (ref 70–99)
Glucose-Capillary: 65 mg/dL — ABNORMAL LOW (ref 70–99)
Glucose-Capillary: 68 mg/dL — ABNORMAL LOW (ref 70–99)
Glucose-Capillary: 68 mg/dL — ABNORMAL LOW (ref 70–99)
Glucose-Capillary: 85 mg/dL (ref 70–99)
Glucose-Capillary: 86 mg/dL (ref 70–99)
Glucose-Capillary: 87 mg/dL (ref 70–99)
Glucose-Capillary: 89 mg/dL (ref 70–99)
Glucose-Capillary: 89 mg/dL (ref 70–99)
Glucose-Capillary: 95 mg/dL (ref 70–99)

## 2021-07-15 SURGERY — INSERTION, PEG TUBE
Anesthesia: Monitor Anesthesia Care | Site: Abdomen

## 2021-07-15 MED ORDER — LIDOCAINE 2% (20 MG/ML) 5 ML SYRINGE
INTRAMUSCULAR | Status: AC
  Filled 2021-07-15: qty 15

## 2021-07-15 MED ORDER — PHENYLEPHRINE 40 MCG/ML (10ML) SYRINGE FOR IV PUSH (FOR BLOOD PRESSURE SUPPORT)
PREFILLED_SYRINGE | INTRAVENOUS | Status: AC
  Filled 2021-07-15: qty 10

## 2021-07-15 MED ORDER — PROPOFOL 500 MG/50ML IV EMUL: INTRAVENOUS | Status: DC | PRN

## 2021-07-15 MED ORDER — DEXTROSE 50 % IV SOLN
25.0000 mL | Freq: Once | INTRAVENOUS | Status: AC
  Administered 2021-07-15: 25 mL via INTRAVENOUS

## 2021-07-15 MED ORDER — DEXTROSE-NACL 10-0.45 % IV SOLN
INTRAVENOUS | Status: DC
  Filled 2021-07-15 (×14): qty 1000

## 2021-07-15 MED ORDER — EPHEDRINE 5 MG/ML INJ
INTRAVENOUS | Status: AC
  Filled 2021-07-15: qty 10

## 2021-07-15 MED ORDER — ORAL CARE MOUTH RINSE: 15.0000 mL | Freq: Once | OROMUCOSAL | Status: DC

## 2021-07-15 MED ORDER — OXYCODONE HCL 5 MG PO TABS: 5.0000 mg | ORAL_TABLET | Freq: Once | ORAL | Status: DC | PRN

## 2021-07-15 MED ORDER — OSMOLITE 1.5 CAL PO LIQD
1000.0000 mL | ORAL | Status: DC
  Administered 2021-07-16 – 2021-08-07 (×15): 1000 mL
  Filled 2021-07-15 (×34): qty 1000

## 2021-07-15 MED ORDER — CHLORHEXIDINE GLUCONATE 0.12 % MT SOLN: 15.0000 mL | Freq: Once | OROMUCOSAL | Status: DC

## 2021-07-15 MED ORDER — OXYCODONE HCL 5 MG/5ML PO SOLN: 5.0000 mg | Freq: Once | ORAL | Status: DC | PRN

## 2021-07-15 MED ORDER — KETOROLAC TROMETHAMINE 15 MG/ML IJ SOLN
15.0000 mg | Freq: Four times a day (QID) | INTRAMUSCULAR | Status: AC | PRN
  Administered 2021-07-15 – 2021-07-16 (×3): 15 mg via INTRAVENOUS
  Filled 2021-07-15 (×3): qty 1

## 2021-07-15 MED ORDER — FENTANYL CITRATE (PF) 250 MCG/5ML IJ SOLN
INTRAMUSCULAR | Status: AC
  Filled 2021-07-15: qty 5

## 2021-07-15 MED ORDER — ONDANSETRON HCL 4 MG/2ML IJ SOLN: 4.0000 mg | Freq: Four times a day (QID) | INTRAMUSCULAR | Status: DC | PRN

## 2021-07-15 MED ORDER — DEXTROSE-NACL 10-0.45 % IV SOLN
INTRAVENOUS | Status: DC
  Filled 2021-07-15: qty 1000

## 2021-07-15 MED ORDER — CHLORHEXIDINE GLUCONATE 0.12 % MT SOLN
OROMUCOSAL | Status: AC
  Filled 2021-07-15: qty 15

## 2021-07-15 MED ORDER — PROPOFOL 500 MG/50ML IV EMUL
INTRAVENOUS | Status: DC | PRN
  Administered 2021-07-15: 100 ug/kg/min via INTRAVENOUS

## 2021-07-15 MED ORDER — DEXTROSE 50 % IV SOLN
12.5000 g | INTRAVENOUS | Status: AC
  Administered 2021-07-15: 12.5 g via INTRAVENOUS

## 2021-07-15 MED ORDER — DEXTROSE 50 % IV SOLN
1.0000 | Freq: Once | INTRAVENOUS | Status: DC
  Filled 2021-07-15 (×2): qty 50

## 2021-07-15 MED ORDER — DEXAMETHASONE SODIUM PHOSPHATE 10 MG/ML IJ SOLN
INTRAMUSCULAR | Status: AC
  Filled 2021-07-15: qty 2

## 2021-07-15 MED ORDER — ROCURONIUM BROMIDE 10 MG/ML (PF) SYRINGE
PREFILLED_SYRINGE | INTRAVENOUS | Status: AC
  Filled 2021-07-15: qty 30

## 2021-07-15 MED ORDER — SUCCINYLCHOLINE CHLORIDE 200 MG/10ML IV SOSY
PREFILLED_SYRINGE | INTRAVENOUS | Status: AC
  Filled 2021-07-15: qty 20

## 2021-07-15 MED ORDER — LIDOCAINE 2% (20 MG/ML) 5 ML SYRINGE
INTRAMUSCULAR | Status: DC | PRN
  Administered 2021-07-15: 100 mg via INTRAVENOUS

## 2021-07-15 MED ORDER — DEXTROSE 50 % IV SOLN
INTRAVENOUS | Status: AC
  Filled 2021-07-15: qty 50

## 2021-07-15 MED ORDER — DEXTROSE 50 % IV SOLN
12.5000 g | Freq: Once | INTRAVENOUS | Status: AC
  Administered 2021-07-15: 12.5 g via INTRAVENOUS
  Filled 2021-07-15: qty 50

## 2021-07-15 MED ORDER — FENTANYL CITRATE (PF) 100 MCG/2ML IJ SOLN: 25.0000 ug | INTRAMUSCULAR | Status: DC | PRN

## 2021-07-15 MED ORDER — LACTATED RINGERS IV SOLN: INTRAVENOUS | Status: DC

## 2021-07-15 MED ORDER — LACTATED RINGERS IV SOLN: INTRAVENOUS | Status: DC | PRN

## 2021-07-15 MED ORDER — ONDANSETRON HCL 4 MG/2ML IJ SOLN
INTRAMUSCULAR | Status: AC
  Filled 2021-07-15: qty 4

## 2021-07-15 SURGICAL SUPPLY — 44 items
APL PRP STRL LF DISP 70% ISPRP (MISCELLANEOUS)
BAG COUNTER SPONGE SURGICOUNT (BAG) ×1 IMPLANT
BAG SPNG CNTER NS LX DISP (BAG)
CANISTER SUCT 3000ML PPV (MISCELLANEOUS) ×1 IMPLANT
CATH GASTROSTOMY 20FR (CATHETERS) IMPLANT
CELLS DAT CNTRL 66122 CELL SVR (MISCELLANEOUS) IMPLANT
CHLORAPREP W/TINT 26 (MISCELLANEOUS) ×1 IMPLANT
COVER SURGICAL LIGHT HANDLE (MISCELLANEOUS) ×1 IMPLANT
DRAPE LAPAROSCOPIC ABDOMINAL (DRAPES) ×1 IMPLANT
DRSG TELFA 3X8 NADH (GAUZE/BANDAGES/DRESSINGS) IMPLANT
ELECT CAUTERY BLADE 6.4 (BLADE) ×1 IMPLANT
ELECT REM PT RETURN 9FT ADLT (ELECTROSURGICAL)
ELECTRODE REM PT RTRN 9FT ADLT (ELECTROSURGICAL) ×1 IMPLANT
GAUZE SPONGE 4X4 12PLY STRL (GAUZE/BANDAGES/DRESSINGS) ×1 IMPLANT
GLOVE SURG ENC MOIS LTX SZ6.5 (GLOVE) ×1 IMPLANT
GLOVE SURG UNDER POLY LF SZ6 (GLOVE) ×1 IMPLANT
GOWN STRL REUS W/ TWL LRG LVL3 (GOWN DISPOSABLE) ×1 IMPLANT
GOWN STRL REUS W/TWL LRG LVL3 (GOWN DISPOSABLE)
HANDLE SUCTION POOLE (INSTRUMENTS) IMPLANT
KIT BASIN OR (CUSTOM PROCEDURE TRAY) ×1 IMPLANT
KIT TUBE JEJUNAL 16FR (CATHETERS) IMPLANT
KIT TURNOVER KIT B (KITS) ×1 IMPLANT
NS IRRIG 1000ML POUR BTL (IV SOLUTION) ×1 IMPLANT
PACK GENERAL/GYN (CUSTOM PROCEDURE TRAY) ×1 IMPLANT
PAD ARMBOARD 7.5X6 YLW CONV (MISCELLANEOUS) ×2 IMPLANT
PAD DRESSING TELFA 3X8 NADH (GAUZE/BANDAGES/DRESSINGS) IMPLANT
PENCIL SMOKE EVACUATOR (MISCELLANEOUS) ×1 IMPLANT
PLUG CATH AND CAP STER (CATHETERS) IMPLANT
RETRACTOR WND ALEXIS 18 MED (MISCELLANEOUS) IMPLANT
RTRCTR WOUND ALEXIS 18CM MED (MISCELLANEOUS)
RTRCTR WOUND ALEXIS 18CM SML (INSTRUMENTS)
SAVER CELL AAL HAEMONETICS (INSTRUMENTS) IMPLANT
SPONGE DRAIN TRACH 4X4 STRL 2S (GAUZE/BANDAGES/DRESSINGS) IMPLANT
SUCTION POOLE HANDLE (INSTRUMENTS)
SUT PDS AB 1 CT  36 (SUTURE)
SUT PDS AB 1 CT 36 (SUTURE) ×1 IMPLANT
SUT PROLENE 2 0 CT2 30 (SUTURE) IMPLANT
SUT SILK 2 0 SH (SUTURE) ×2 IMPLANT
SUT SILK 2 0 SH CR/8 (SUTURE) ×1 IMPLANT
SYR 10ML LL (SYRINGE) ×1 IMPLANT
SYRINGE TOOMEY DISP (SYRINGE) ×1 IMPLANT
TOWEL GREEN STERILE (TOWEL DISPOSABLE) ×1 IMPLANT
TOWEL GREEN STERILE FF (TOWEL DISPOSABLE) ×1 IMPLANT
TUBE J 18FR (TUBING) IMPLANT

## 2021-07-15 NOTE — Progress Notes (Signed)
PT Cancellation Note  Patient Details Name: ELIAZER HEMPHILL MRN: [id number] DOB: 05-Apr-1931   Cancelled Treatment:    Reason Eval/Treat Not Completed: Patient at procedure or test/unavailable. Pt undergoing PEG tube placement. Will follow-up as schedule allows.   Dawayne Cirri, SPT Dawayne Cirri 07/15/2021, 12:21 PM

## 2021-07-15 NOTE — Plan of Care (Signed)
  Problem: Pain Managment: Goal: General experience of comfort will improve Outcome: Progressing   

## 2021-07-15 NOTE — Anesthesia Preprocedure Evaluation (Signed)
Anesthesia Evaluation  Patient identified by MRN, date of birth, ID band Patient awake    Reviewed: Allergy & Precautions, H&P , NPO status , Patient's Chart, lab work & pertinent test results  Airway Mallampati: Trach   Neck ROM: full    Dental   Pulmonary COPD, former smoker,    breath sounds clear to auscultation       Cardiovascular hypertension, + angina + CAD, + Peripheral Vascular Disease and +CHF   Rhythm:regular Rate:Normal     Neuro/Psych PSYCHIATRIC DISORDERS Depression Dementia    GI/Hepatic PUD,   Endo/Other  diabetes, Type 2Hypothyroidism   Renal/GU      Musculoskeletal  (+) Arthritis ,   Abdominal   Peds  Hematology  (+) Blood dyscrasia, anemia ,   Anesthesia Other Findings   Reproductive/Obstetrics                             Anesthesia Physical Anesthesia Plan  ASA: 3  Anesthesia Plan: General   Post-op Pain Management:    Induction: Intravenous  PONV Risk Score and Plan: 2 and Ondansetron, Dexamethasone and Treatment may vary due to age or medical condition  Airway Management Planned: Tracheostomy  Additional Equipment:   Intra-op Plan:   Post-operative Plan: Extubation in OR  Informed Consent: I have reviewed the patients History and Physical, chart, labs and discussed the procedure including the risks, benefits and alternatives for the proposed anesthesia with the patient or authorized representative who has indicated his/her understanding and acceptance.       Plan Discussed with: CRNA, Anesthesiologist and Surgeon  Anesthesia Plan Comments:         Anesthesia Quick Evaluation

## 2021-07-15 NOTE — Progress Notes (Signed)
Pt arrived back from surgery complaining of bilateral hip pain. Pt also noted to have moderate amount of thin blood tinged secretions coming from trach. MD at bedside and made aware.

## 2021-07-15 NOTE — Progress Notes (Signed)
   Trauma/Critical Care Follow Up Note  Subjective:    Overnight Issues:   Objective:  Vital signs for last 24 hours: Temp:  [97.9 F (36.6 C)-98.5 F (36.9 C)] 97.9 F (36.6 C) (08/23 0700) Pulse Rate:  [63-79] 64 (08/23 0758) Resp:  [12-25] 17 (08/23 0751) BP: (109-193)/(45-71) 162/49 (08/23 0758) SpO2:  [94 %-100 %] 100 % (08/23 0751) FiO2 (%):  [28 %] 28 % (08/23 0751) Weight:  [69 kg] 69 kg (08/23 0610)  Hemodynamic parameters for last 24 hours:    Intake/Output from previous day: 08/22 0701 - 08/23 0700 In: 2263.2 [I.V.:422; KK/XF:8182.9; IV Piggyback:300] Out: 1800 [Urine:1800]  Intake/Output this shift: No intake/output data recorded.  Vent settings for last 24 hours: FiO2 (%):  [28 %] 28 %  Physical Exam:  Gen: comfortable, no distress Neuro: appropriate, answers questions HEENT: PERRL Neck: trached CV: RRR Pulm: unlabored breathing Abd: soft, NT, open chole scar Extr: wwp, no edema   Results for orders placed or performed during the hospital encounter of 07/01/21 (from the past 24 hour(s))  Glucose, capillary     Status: None   Collection Time: 07/14/21 11:35 AM  Result Value Ref Range   Glucose-Capillary 77 70 - 99 mg/dL  Glucose, capillary     Status: Abnormal   Collection Time: 07/14/21  4:49 PM  Result Value Ref Range   Glucose-Capillary 56 (L) 70 - 99 mg/dL  Glucose, capillary     Status: None   Collection Time: 07/14/21  5:30 PM  Result Value Ref Range   Glucose-Capillary 70 70 - 99 mg/dL  Glucose, capillary     Status: Abnormal   Collection Time: 07/14/21  9:08 PM  Result Value Ref Range   Glucose-Capillary 126 (H) 70 - 99 mg/dL  Glucose, capillary     Status: Abnormal   Collection Time: 07/15/21 12:14 AM  Result Value Ref Range   Glucose-Capillary 120 (H) 70 - 99 mg/dL  Glucose, capillary     Status: Abnormal   Collection Time: 07/15/21  4:06 AM  Result Value Ref Range   Glucose-Capillary 65 (L) 70 - 99 mg/dL  Glucose, capillary      Status: None   Collection Time: 07/15/21  6:06 AM  Result Value Ref Range   Glucose-Capillary 89 70 - 99 mg/dL  Glucose, capillary     Status: None   Collection Time: 07/15/21  7:41 AM  Result Value Ref Range   Glucose-Capillary 95 70 - 99 mg/dL    Assessment & Plan:  Present on Admission:  Acute respiratory failure with hypoxia (HCC)  COPD with acute exacerbation (HCC)  Essential hypertension  Left renal mass  Respiratory failure, acute (HCC)  Dementia (HCC)    LOS: 14 days   Additional comments:None  Dysphagia - plan for PEG vs open G-tube today. Discussed procedure with patient at bedside and his wife via phone. Discussed possible complications regarding passage of scope and/or risks of general anesthesia Opportunity provided for questions and all were answered.  FEN - NPO since [place] Dispo - per primary team    Jesusita Oka, MD Trauma & General Surgery Please use [email or web address] to contact on call provider  07/15/2021  *Care during the described time interval was provided by me. I have reviewed this patient's available data, including medical history, events of note, physical examination and test results as part of my evaluation.

## 2021-07-15 NOTE — Progress Notes (Signed)
Hypoglycemic Event  CBG: 57  Treatment: D50 25 mL (12.5 gm)  Symptoms: None  Follow-up CBG: Time:1500 CBG Result:86  Possible Reasons for Event: Inadequate meal intake   Rommie Dunn, Jacinto Halim

## 2021-07-15 NOTE — Op Note (Signed)
   Procedure Note  Date: 07/15/2021  Procedure: esophagogastroduodenoscopy (EGD) and percutaneous endoscopic gastrostomy (PEG) tube placement  Pre-op diagnosis: dysphagia, malnutrition Post-op diagnosis: same  Indication and clinical history: 14M with dysphagia  Surgeon: Jesusita Oka, MD Assistant: Wynetta Emery, [place]  Anesthesia: MAC  Findings: Specimen: none EBL: <5cc Drains/Implants: PEG tube,  2.5 cm at the skin   Disposition: ICU/PACU  Description of Procedure: The patient was positioned supine. Time-out was performed verifying correct patient, procedure, signature of informed consent, and pre-operative antibiotics as indicated. MAC induction was uneventful and a bite block was placed into the oropharynx. The endoscope was inserted into the oropharynx and advanced down the esophagus into the stomach and into the duodenum. The visualized esophagus and duodenum were unremarkable. The endoscope was retracted back into the stomach and the stomach was insufflated. The stomach was inspected and was also normal. Transillumination was performed. The light was visible on the external skin and dimpling of the stomach was noted endoscopically with manual pressure. The abdomen was prepped and draped in the usual sterile fashion. Transillumination and dimpling were repeated and local anesthetic was infiltrated to make a skin wheal at the site of transillumination. The needle was inserted perpendicularly to the skin and the tip of the needle was visualized endoscopically. As the needle was retracted, the tract was also anesthetized. A skin nick was made at the site of the wheal and an introducer needle and sheath were inserted. The needle was removed and guidewire inserted. The guidewire was grasped by an endoscopic snare and the snare, guidewire, and endoscope retracted out of the oropharynx. The PEG tube was secured to the guidewire and retracted through the mouth and esophagus into the stomach. The PEG  tube was secured with a bolster and was visualized endoscopically to spin freely circumferentially and also be without gaps between the internal bumper and the stomach wall. There was no evidence of bleeding. The PEG bolster was secured at  2.5 cm at the skin and there were no gaps between the bolster and the abdominal wall. The stomach was desufflated endoscopically and the endoscope removed. The bite block was also removed. The patient tolerated the procedure well and there were no complications.   The patient may have water and medications administered via the PEG tube beginning immediately and tube feeds may be initiated four hours post-procedure.    Jesusita Oka, MD General and [facility]

## 2021-07-15 NOTE — Progress Notes (Signed)
Pacu RN Report to floor given  Gave report to [employer], [phone]. Discussed surgery, meds given in OR and Pacu, VS, IV fluids given, EBL, urine output, pain, Trach status, copious thin/blood tinged secretions and other pertinent information. Also discussed if pt had any family or friends here or belongings with them.   Pt exits my care.

## 2021-07-15 NOTE — Transfer of Care (Signed)
Immediate Anesthesia Transfer of Care Note  Patient: Christopher Santiago  Procedure(s) Performed: PERCUTANEOUS ENDOSCOPIC GASTROSTOMY (PEG) PLACEMENT, (Abdomen)  Patient Location: PACU  Anesthesia Type:MAC  Level of Consciousness: awake and alert   Airway & Oxygen Therapy: Patient Spontanous Breathing and Patient connected to tracheostomy mask oxygen  Post-op Assessment: Report given to RN and Post -op Vital signs reviewed and stable  Post vital signs: Reviewed and stable  Last Vitals:  Vitals Value Taken Time  BP 145/70   Temp    Pulse 86   Resp 16   SpO2 100%     Last Pain:  Vitals:   07/15/21 1244  TempSrc: Axillary  PainSc: 0-No pain      Patients Stated Pain Goal: 0 (57/26/20 3559)  Complications: No notable events documented.

## 2021-07-15 NOTE — Progress Notes (Signed)
PROGRESS NOTE  Christopher Santiago YQM:[id number] DOB: [birth year] DOA: 07/01/2021 PCP: Tonia Ghent, MD   LOS: 14 days   Brief Narrative / Interim history: 87 year old with past medical history significant for COPD, quit smoking 30 years ago, vocal cord cancer, diabetes who presents complaining of exertional dyspnea, reports sinusitis, postnasal drip, hoarseness for the past 3 to 4 months. He was also complaining of difficulty swallowing, food gets stuck in the throat, he also report phlegm, in his throat. CT maxillofacial, soft tissue neck: Asymmetric soft tissue density/fullness at the base of the right aryepiglottic fold, extending into the right piriform sinus with possible involvement of the right glottis inferiorly.  If echo to measure or define a discrete mass within this region given lack of IV contrast. ENT consulted for direct visualization.  Patient underwent  laryngoscopy with biopsy and tracheostomy 8/10.  Currently getting PEG tube placement then will need disposition, cancer treatment with radiation therapy was offered following discharge  Subjective / 24h Interval events: Alert, being cleaned by nursing staff.  Appears comfortable.  Awaiting PEG tube placement by surgery this morning  Assessment & Plan: Principal Problem Acute Hypoxic Respiratory Failure, tracheostomy in place-Suspect related to component of aspiration due to dysphagia larynx CA, COPD exacerbation.  He has completed 5 days of IV steroids as well as 7 days of cefepime with last day 8/21, respiratory status has remained stable and wheezing has resolved -respiratory culture with normal respiratory flora -Continue Mucomyst 3 times daily -ENT managing trach care -Secretions overall improving  Active Problems Squamous cell carcinoma Larynx-CT: With asymmetric soft tissue density fullness at the base of the right aryepiglottic fold: ENT consulted.  Patient underwent laryngoscopy with biopsy and tracheostomy 8/11. Stable  on ATC 5 L oxygen.  -Biopsy showed squamous cell carcinoma with foci suspicious for invasion.  -He was discussed at tumor board, will have outpatient follow-up with medical oncology, radiation oncology.  CT scan of the neck and chest showing that the pulmonary nodules have not progressed since 2019 therefore benign -Trach changed to Shiley 6 Prox XLT Cuffless.   Nutrition-getting PEG tube placement today  Acute metabolic encephalopathy- intermittent, in the setting of acute illness/hospitalization.  Ativan as needed, hold if too sedated  1.2 cm spiculated right upper lobe nodule-outpatient follow up   Left Renal mass -Concerning for renal carcinoma, will need to follow-up with urology as an outpatient   Diabetes mellitus - Not on medication. Continue SSI  HTN -Resume metoprolol, Continue with Norvasc.    Leukocytosis probably related to steroids vs infection.  Resolved    Scheduled Meds:  acetaminophen  500 mg Per Tube TID   allopurinol  100 mg Oral Daily   amLODipine  5 mg Oral Daily   arformoterol  15 mcg Nebulization BID   budesonide (PULMICORT) nebulizer solution  0.25 mg Nebulization BID   chlorhexidine gluconate (MEDLINE KIT)  15 mL Mouth Rinse BID   Chlorhexidine Gluconate Cloth  6 each Topical Daily   feeding supplement (PROSource TF)  45 mL Per Tube BID   free water  150 mL Per Tube Q4H   guaiFENesin  10 mL Oral Q8H   insulin aspart  0-5 Units Subcutaneous QHS   insulin aspart  0-9 Units Subcutaneous TID WC   lidocaine  1 application Urethral Once   mouth rinse  15 mL Mouth Rinse 10 times per day   nystatin  5 mL Oral QID   revefenacin  175 mcg Nebulization Daily   scopolamine  1  patch Transdermal Q72H   sennosides  5 mL Oral BID   sodium chloride flush  10-40 mL Intracatheter Q12H   Continuous Infusions:  dextrose 5 % and 0.9% NaCl 60 mL/hr at 07/15/21 0438   feeding supplement (OSMOLITE 1.5 CAL) Stopped (07/15/21 0011)   PRN Meds:.albuterol, bisacodyl,  hydrALAZINE, lip balm, LORazepam, sodium chloride flush, traMADol  Diet Orders (From admission, onward)     Start     Ordered   07/03/21 1433  Diet NPO time specified Except for: Ice Chips  Diet effective now       Comments: Occasional ice chips after oral care  Question:  Except for  Answer:  Ice Chips   07/03/21 1432            DVT prophylaxis: Place and maintain sequential compression device Start: 07/04/21 1027     Code Status: DNR  Family Communication: Daughter at bedside  Status is: Inpatient  Remains inpatient appropriate because:IV treatments appropriate due to intensity of illness or inability to take PO  Dispo: The patient is from: Home              Anticipated d/c is to: Home              Patient currently is not medically stable to d/c.   Difficult to place patient No  Level of care: Progressive  Consultants:  ENT  Procedures:  Laryngoscopy / trach 2d echo IMPRESSIONS   1. Technically difficult study, very poor visualization of left ventricle even after contrast administration. Left ventricular ejection fraction, by estimation, is 55 to 60%. The left ventricle has grossly normal function but very poorly visualized. Left ventricular endocardial border not optimally defined to evaluate regional wall motion. Left ventricular diastolic parameters are indeterminate.   2. Right ventricular systolic function is normal. The right ventricular size is mildly enlarged. Tricuspid regurgitation signal is inadequate for assessing PA pressure.   3. Right atrial size was moderately dilated.   4. A small pericardial effusion is present. The pericardial effusion is anterior to the right ventricle. Presence of pericardial fat pad.   5. The mitral valve is grossly normal. No evidence of mitral valve regurgitation.   6. The aortic valve was not well visualized. Aortic valve regurgitation is not visualized. No aortic stenosis is present.   7. The inferior vena cava is normal in  size with greater than 50% respiratory variability, suggesting right atrial pressure of 3 mmHg.   Microbiology  none  Antimicrobials: None   Objective: Vitals:   07/15/21 0619 07/15/21 0700 07/15/21 0751 07/15/21 0758  BP: (!) 164/56 (!) 162/49  (!) 162/49  Pulse:  63 63 64  Resp: 17 18 17    Temp:  97.9 F (36.6 C)    TempSrc:  Axillary    SpO2:  98% 100%   Weight:      Height:        Intake/Output Summary (Last 24 hours) at 07/15/2021 0930 Last data filed at 07/15/2021 1191 Gross per 24 hour  Intake 2263.24 ml  Output 1800 ml  Net 463.24 ml    Filed Weights   07/08/21 0505 07/11/21 0506 07/15/21 0610  Weight: 71.8 kg 70.6 kg 69 kg    Examination:  Constitutional: Alert Eyes: Anicteric ENMT: mmm Neck: normal, supple Respiratory: Faint rhonchi at the bases, no wheezing, no crackles, overall moving air well Cardiovascular: Regular rate and rhythm, no murmurs, no peripheral edema Abdomen: Soft, nontender, nondistended, bowel sounds positive Musculoskeletal: no clubbing /  cyanosis.  Skin: No rashes seen Neurologic: Nonfocal, equal strength  Data Reviewed: I have independently reviewed following labs and imaging studies   CBC: Recent Labs  Lab 07/08/21 1143 07/09/21 0312 07/11/21 0304 07/12/21 0124 07/13/21 0321  WBC 12.1* 14.3* 10.1 9.4 10.1  HGB 11.8* 12.2* 11.1* 10.6* 10.8*  HCT 38.0* 39.2 35.1* 32.4* 33.8*  MCV 95.7 95.4 93.9 93.1 94.4  PLT 133* 131* 156 157 716    Basic Metabolic Panel: Recent Labs  Lab 07/08/21 1258 07/09/21 0312 07/11/21 0304 07/12/21 0124 07/13/21 0321  NA 136 134* 135 131* 136  K 5.1 4.6 4.9 4.7 4.4  CL 104 101 102 100 103  CO2 29 27 26 26 27   GLUCOSE 150* 125* 159* 141* 103*  BUN 26* 24* 26* 26* 20  CREATININE 0.78 0.81 0.83 0.83 0.85  CALCIUM 8.9 9.0 9.1 8.8* 9.0    Liver Function Tests: Recent Labs  Lab 07/11/21 0304  AST 13*  ALT 14  ALKPHOS 56  BILITOT 0.3  PROT 6.1*  ALBUMIN 2.4*    Coagulation  Profile: No results for input(s): INR, PROTIME in the last 168 hours. HbA1C: No results for input(s): HGBA1C in the last 72 hours. CBG: Recent Labs  Lab 07/14/21 2108 07/15/21 0014 07/15/21 0406 07/15/21 0606 07/15/21 0741  GLUCAP 126* 120* 65* 89 95     Recent Results (from the past 240 hour(s))  Culture, Respiratory w Gram Stain     Status: None   Collection Time: 07/06/21  2:17 PM   Specimen: Tracheal Aspirate; Respiratory  Result Value Ref Range Status   Specimen Description TRACHEAL ASPIRATE  Final   Special Requests NONE  Final   Gram Stain   Final    RARE WBC PRESENT, PREDOMINANTLY PMN FEW GRAM POSITIVE COCCI IN CLUSTERS RARE GRAM NEGATIVE RODS RARE GRAM POSITIVE RODS    Culture   Final    FEW Normal respiratory flora-no Staph aureus or Pseudomonas seen Performed at [facility], [address] [place], [place] [address]    Report Status 07/08/2021 FINAL  Final      Radiology Studies: DG Abd Portable 1V  Result Date: 07/14/2021 CLINICAL DATA:  Feeding tube placement. EXAM: PORTABLE ABDOMEN - 1 VIEW COMPARISON:  07/11/2021 FINDINGS: Feeding tube tip is in the gastric fundus. Upper abdomen demonstrates nonspecific bowel gas pattern. Interstitial changes noted in the lung bases with left base linear atelectasis or scarring. IMPRESSION: Feeding tube tip is in the stomach. Electronically Signed   By: Misty Stanley M.D.   On: 07/14/2021 13:34   ECHOCARDIOGRAM COMPLETE  Result Date: 07/14/2021    ECHOCARDIOGRAM REPORT   Patient Name:   Christopher Santiago Date of Exam: 07/14/2021 Medical Rec #:  [id number]      Height:       66.0 in Accession #:    [id number]     Weight:       155.6 lb Date of Birth:  December 01, 1930      BSA:          1.798 m Patient Age:    35 years       BP:           186/52 mmHg Patient Gender: M              HR:           58 bpm. Exam Location:  Inpatient Procedure: 2D Echo, Cardiac Doppler, Color Doppler and Intracardiac  Opacification Agent  Indications:     Abnormal EKG  History:         Patient has prior history of Echocardiogram examinations, most                  recent 08/10/2019. COPD, Signs/Symptoms:Dyspnea; Risk                  Factors:Diabetes, Hypertension and Former Smoker. Resp.                  failure, tracheostomy.  Sonographer:     Dustin Flock RDCS Referring Phys:  [place] Diagnosing Phys: Oswaldo Milian MD  Sonographer Comments: Technically difficult study due to poor echo windows, suboptimal parasternal window, suboptimal apical window and suboptimal subcostal window. Image acquisition challenging due to COPD. IMPRESSIONS  1. Technically difficult study, very poor visualization of left ventricle even after contrast administration. Left ventricular ejection fraction, by estimation, is 55 to 60%. The left ventricle has grossly normal function but very poorly visualized. Left ventricular endocardial border not optimally defined to evaluate regional wall motion. Left ventricular diastolic parameters are indeterminate.  2. Right ventricular systolic function is normal. The right ventricular size is mildly enlarged. Tricuspid regurgitation signal is inadequate for assessing PA pressure.  3. Right atrial size was moderately dilated.  4. A small pericardial effusion is present. The pericardial effusion is anterior to the right ventricle. Presence of pericardial fat pad.  5. The mitral valve is grossly normal. No evidence of mitral valve regurgitation.  6. The aortic valve was not well visualized. Aortic valve regurgitation is not visualized. No aortic stenosis is present.  7. The inferior vena cava is normal in size with greater than 50% respiratory variability, suggesting right atrial pressure of 3 mmHg. FINDINGS  Left Ventricle: Left ventricular ejection fraction, by estimation, is 55 to 60%. The left ventricle has normal function. Left ventricular endocardial border not optimally defined to evaluate regional wall  motion. Definity contrast agent was given IV to delineate the left ventricular endocardial borders. The left ventricular internal cavity size was not well visualized. There is not well visualized left ventricular hypertrophy. Left ventricular diastolic parameters are indeterminate. Right Ventricle: The right ventricular size is mildly enlarged. Right vetricular wall thickness was not well visualized. Right ventricular systolic function is normal. Tricuspid regurgitation signal is inadequate for assessing PA pressure. Left Atrium: Left atrial size was normal in size. Right Atrium: Right atrial size was moderately dilated. Pericardium: A small pericardial effusion is present. The pericardial effusion is anterior to the right ventricle. Presence of pericardial fat pad. Mitral Valve: The mitral valve is grossly normal. No evidence of mitral valve regurgitation. Tricuspid Valve: The tricuspid valve is normal in structure. Tricuspid valve regurgitation is trivial. Aortic Valve: The aortic valve was not well visualized. Aortic valve regurgitation is not visualized. No aortic stenosis is present. Pulmonic Valve: The pulmonic valve was not well visualized. Pulmonic valve regurgitation is not visualized. Aorta: The aortic root was not well visualized. Venous: The inferior vena cava is normal in size with greater than 50% respiratory variability, suggesting right atrial pressure of 3 mmHg. IAS/Shunts: The interatrial septum was not well visualized.   Diastology LV e' medial:    4.03 cm/s LV E/e' medial:  9.7 LV e' lateral:   4.79 cm/s LV E/e' lateral: 8.1  RIGHT VENTRICLE RV Basal diam:  4.30 cm RV S prime:     9.14 cm/s TAPSE (M-mode): 1.8 cm LEFT ATRIUM  Index       RIGHT ATRIUM           Index LA Vol (A4C): 51.7 ml 28.76 ml/m RA Area:     23.40 cm                                   RA Volume:   73.80 ml  41.05 ml/m  AORTIC VALVE LVOT Vmax:   64.00 cm/s LVOT Vmean:  40.600 cm/s LVOT VTI:    0.131 m MITRAL VALVE MV  Area (PHT): 3.15 cm    SHUNTS MV Decel Time: 241 msec    Systemic VTI: 0.13 m MV E velocity: 39.00 cm/s MV A velocity: 48.00 cm/s MV E/A ratio:  0.81 Oswaldo Milian MD Electronically signed by Oswaldo Milian MD Signature Date/Time: 07/14/2021/11:52:38 AM    Final (Updated)       Marzetta Board, MD, PhD [facility]  Between 7 am - 7 pm I am available, please contact me via Amion (for emergencies) or Securechat (non urgent messages)  Between 7 pm - 7 am I am not available, please contact night coverage MD/APP via Amion

## 2021-07-15 NOTE — Progress Notes (Signed)
Hypoglycemic Event  CBG: 68   Treatment: D50 25 mL (12.5 gm)  Symptoms: None  Follow-up CBG: Time:415 CBG Result:87  Possible Reasons for Event: Other: Pt NPO for surgery, and feedings held   Comments/MD notified:C. Gherghe MD ordered dextrose10% and NaCl    Christopher Santiago

## 2021-07-15 NOTE — Progress Notes (Signed)
[facility] night shift PCU coverage note.  The nursing staff reported that the patient's most recent CBG was 65 mg/dL despite having D5 NS at 50 mL/h.  Dextrose 12.5 g IVP x1 ordered and the D5 NS solution rate was increased to 60 mL/h.  Tennis Must, MD.

## 2021-07-16 ENCOUNTER — Encounter (HOSPITAL_COMMUNITY): Payer: Self-pay | Admitting: Surgery

## 2021-07-16 LAB — GLUCOSE, CAPILLARY
Glucose-Capillary: 106 mg/dL — ABNORMAL HIGH (ref 70–99)
Glucose-Capillary: 107 mg/dL — ABNORMAL HIGH (ref 70–99)
Glucose-Capillary: 109 mg/dL — ABNORMAL HIGH (ref 70–99)
Glucose-Capillary: 79 mg/dL (ref 70–99)
Glucose-Capillary: 80 mg/dL (ref 70–99)
Glucose-Capillary: 137 mg/dL — ABNORMAL HIGH (ref 70–99)

## 2021-07-16 LAB — CBC
HCT: 32.1 % — ABNORMAL LOW (ref 39.0–52.0)
Hemoglobin: 10.5 g/dL — ABNORMAL LOW (ref 13.0–17.0)
MCH: 30.8 pg (ref 26.0–34.0)
MCHC: 32.7 g/dL (ref 30.0–36.0)
MCV: 94.1 fL (ref 80.0–100.0)
Platelets: 194 10*3/uL (ref 150–400)
RBC: 3.41 MIL/uL — ABNORMAL LOW (ref 4.22–5.81)
RDW: 13.2 % (ref 11.5–15.5)
WBC: 10.9 10*3/uL — ABNORMAL HIGH (ref 4.0–10.5)
nRBC: 0 % (ref 0.0–0.2)

## 2021-07-16 LAB — COMPREHENSIVE METABOLIC PANEL
ALT: 12 U/L (ref 0–44)
AST: 14 U/L — ABNORMAL LOW (ref 15–41)
Albumin: 2.2 g/dL — ABNORMAL LOW (ref 3.5–5.0)
Alkaline Phosphatase: 42 U/L (ref 38–126)
Anion gap: 5 (ref 5–15)
BUN: 14 mg/dL (ref 8–23)
CO2: 27 mmol/L (ref 22–32)
Calcium: 9 mg/dL (ref 8.9–10.3)
Chloride: 107 mmol/L (ref 98–111)
Creatinine, Ser: 0.85 mg/dL (ref 0.61–1.24)
GFR, Estimated: >60 mL/min (ref >60)
Glucose, Bld: 123 mg/dL — ABNORMAL HIGH (ref 70–99)
Potassium: 4 mmol/L (ref 3.5–5.1)
Sodium: 139 mmol/L (ref 135–145)
Total Bilirubin: 0.9 mg/dL (ref 0.3–1.2)
Total Protein: 5.7 g/dL — ABNORMAL LOW (ref 6.5–8.1)

## 2021-07-16 NOTE — Anesthesia Postprocedure Evaluation (Signed)
Anesthesia Post Note  Patient: Christopher Santiago  Procedure(s) Performed: PERCUTANEOUS ENDOSCOPIC GASTROSTOMY (PEG) PLACEMENT, (Abdomen)     Patient location during evaluation: PACU Anesthesia Type: MAC Level of consciousness: awake and alert Pain management: pain level controlled Vital Signs Assessment: post-procedure vital signs reviewed and stable Respiratory status: spontaneous breathing, nonlabored ventilation, respiratory function stable and patient connected to nasal cannula oxygen Cardiovascular status: stable and blood pressure returned to baseline Postop Assessment: no apparent nausea or vomiting Anesthetic complications: no   No notable events documented.  Last Vitals:  Vitals:   07/16/21 0809 07/16/21 0810  BP:    Pulse:  76  Resp:  18  Temp:    SpO2: 100% 100%    Last Pain:  Vitals:   07/16/21 0401  TempSrc: Oral  PainSc:                  [place]

## 2021-07-16 NOTE — Progress Notes (Signed)
Inpatient Rehab Admissions Coordinator:   I spoke with Marya Amsler with TOC and he reports that Pt.'s daughter is now stating that she can stay with Pt. And provide 24/7 support. CIR had previously signed off because Pt.'s wife stated she was unable to care for him and requested SNF placement. I will place a new consult order at this time.   Clemens Catholic, [place], [place] Admissions Coordinator  [phone] ([place]) [phone] (office)

## 2021-07-16 NOTE — Progress Notes (Signed)
Daily Progress Note   Patient Name: Christopher Santiago       Date: 07/16/2021 DOB: 05/28/1931  Age: 87 y.o. MRN#: [id number] Attending Physician: Albertine Patricia, MD Primary Care Physician: Tonia Ghent, MD Admit Date: 07/01/2021  Reason for Consultation/Follow-up: Establishing goals of care  Patient Profile/HPI:  87 y.o. male  with past medical history of COPD, vocal cord cancer, DM admitted on 07/01/2021 with complaints of exertional dyspnea, sinusitis, hoarseness for 3 months. 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 He is s/p trach on 8/10. PEG placement is pending, IR has been consulted. Palliative medicine consulted for goals of care.     Subjective: Visited patient. He appears to be doing much better- mental status improved. He was sitting up and reading a news magazine, no complaints.  Called patient's spouseHassan Rowan for followup for our Carmen discussion from last week. She is satisfied with current goals and plan of care.  She inquired about patient's dog coming to visit.      Physical Exam Vitals and nursing note reviewed.  Cardiovascular:     Rate and Rhythm: Normal rate.  Pulmonary:     Effort: Pulmonary effort is normal.  Skin:    General: Skin is warm and dry.  Neurological:     Mental Status: Mental status is at baseline.  Psychiatric:     Comments: Pleasant, not pulling tubes out            Vital Signs: BP (!) 131/54 (BP Location: Right Arm)   Pulse 66   Temp 98 F (36.7 C) (Oral)   Resp 20   Ht 5\' 6"  (1.676 m)   Wt 69 kg   SpO2 100%   BMI 24.55 kg/m  SpO2: SpO2: 100 % O2 Device: O2 Device: Tracheostomy Collar O2 Flow Rate: O2 Flow Rate (L/min): 5 L/min  Intake/output summary:  Intake/Output Summary (Last 24 hours) at 07/16/2021 1103 Last  data filed at 07/16/2021 0700 Gross per 24 hour  Intake 693.58 ml  Output 705 ml  Net -11.42 ml    LBM: Last BM Date: 07/09/21 Baseline Weight: Weight: 77.1 kg Most recent weight: Weight: 69 kg       Palliative Assessment/Data: PPS: 30%      Patient Active Problem List   Diagnosis Date Noted  . Dementia (  [place]) 07/12/2021  . Malignant neoplasm of supraglottis ([place]) 07/09/2021  . Tracheostomy status ([place])   . Tracheobronchitis   . Protein-calorie malnutrition, severe 07/03/2021  . Left renal mass 07/02/2021  . Respiratory failure, acute ([place]) 07/02/2021  . Acute respiratory failure with hypoxia ([place]) 07/01/2021  . Back pain 06/05/2020  . Constipation 06/05/2020  . Memory change 07/03/2019  . BPH with urinary obstruction 11/10/2018  . Bronchiectasis ([place]) 08/11/2018  . Anemia 08/11/2018  . Osteoarthritis of right hip 04/11/2018  . Ascending aorta dilation (H[facility]) 03/09/2018  . Pulmonary hypertension, unspecified ([place]) 03/09/2018  . Chronic diastolic CHF (congestive heart failure) ([place]) 03/09/2018  . Chronic cough 06/08/2016  . Alcohol use 02/20/2016  . Mixed hyperlipidemia 02/10/2016  . SOB (shortness of breath) 08/26/2015  . Bradycardia 08/26/2015  . Acute pulmonary embolism ([place]) 11/19/2014  . COPD with acute exacerbation ([place]) 12/27/2012  . Biceps tendonitis 07/12/2012  . Medicare annual wellness visit, subsequent 02/04/2012  . Advance directive discussed with patient 02/04/2012  . PVD (peripheral vascular disease) ([place]) 12/22/2011  . Rhinitis 01/29/2011  . CERVICAL STRAIN, ACUTE 04/08/2010  . Hypothyroidism 01/17/2009  . CARCINOMA, SKIN, SQUAMOUS CELL 01/08/2009  . Weakness 12/27/2008  . Depression 08/22/2008  . Gout 12/12/2007  . OBESITY 09/24/2007  . Atherosclerosis of native coronary artery of native heart with stable angina pectoris ([place]) 06/16/2007  . ERECTILE DYSFUNCTION 05/07/2007  . OSA on CPAP 05/05/2007  . Essential hypertension 05/05/2007  .  Coronary atherosclerosis 05/05/2007  . SLEEP APNEA 05/05/2007  . Hyperglycemia 04/05/2007    Palliative Care Assessment & Plan    Assessment/Recommendations/Plan  Continue with current plan of care- please see previous note for full [place] discussion   Code Status: DNR  Prognosis:  Unable to determine  Discharge Planning: [place] for rehab with Palliative care service follow-up  Care plan was discussed with patient's spouse.  Thank you for allowing the Palliative Medicine Team to assist in the care of this patient.  Total time:  Prolonged billing: 42 minutes     Greater than 50%  of this time was spent counseling and coordinating care related to the above assessment and plan.  Mariana Kaufman, AGNP-C Palliative Medicine   Please contact Palliative Medicine Team phone at [phone] for questions and concerns.

## 2021-07-16 NOTE — Progress Notes (Signed)
Progress Note  1 Day Post-Op  Subjective: Patient alert and motioning to trach this AM, suctioned secretions and he seemed to feel better. He has copious trach secretions. He denies abdominal pain.   Objective: Vital signs in last 24 hours: Temp:  [97.6 F (36.4 C)-98.9 F (37.2 C)] 98.1 F (36.7 C) (08/24 0401) Pulse Rate:  [57-81] 68 (08/24 0401) Resp:  [12-21] 16 (08/24 0401) BP: (108-173)/(42-70) 153/53 (08/24 0401) SpO2:  [98 %-100 %] 100 % (08/24 0401) FiO2 (%):  [28 %] 28 % (08/24 0330) Last BM Date: 07/09/21  Intake/Output from previous day: 08/23 0701 - 08/24 0700 In: 693.6 [I.V.:693.6] Out: 705 [Urine:700; Blood:5] Intake/Output this shift: No intake/output data recorded.  PE: General: pleasant, WD/WN white male who is laying in bed  Neck: Trach in place w/ copious whitish secretions  Heart: regular, rate, and rhythm.   Lungs: wheezing bilaterally Abd:  Soft, NT/ND, +BS, PEG in place without drainage or bleeding at site, abdominal binder reapplied loosely  MS: no BUE/BLE edema, calves soft and nontender Skin: warm and dry with no masses, lesions, or rashes   Lab Results:  Recent Labs    07/16/21 0331  WBC 10.9*  HGB 10.5*  HCT 32.1*  PLT 194   BMET Recent Labs    07/16/21 0331  NA 139  K 4.0  CL 107  CO2 27  GLUCOSE 123*  BUN 14  CREATININE 0.85  CALCIUM 9.0   PT/INR No results for input(s): LABPROT, INR in the last 72 hours. CMP     Component Value Date/Time   NA 139 07/16/2021 0331   NA 145 (H) 01/25/2018 1548   NA 136 11/09/2014 0440   K 4.0 07/16/2021 0331   K 4.6 11/09/2014 0440   CL 107 07/16/2021 0331   CL 101 11/09/2014 0440   CO2 27 07/16/2021 0331   CO2 30 11/09/2014 0440   GLUCOSE 123 (H) 07/16/2021 0331   GLUCOSE 135 (H) 11/09/2014 0440   BUN 14 07/16/2021 0331   BUN 28 (H) 01/25/2018 1548   BUN 53 (H) 11/09/2014 0440   CREATININE 0.85 07/16/2021 0331   CREATININE 1.16 12/03/2014 1306   CALCIUM 9.0 07/16/2021  0331   CALCIUM 9.2 11/09/2014 0440   PROT 5.7 (L) 07/16/2021 0331   PROT 7.6 11/06/2014 0414   ALBUMIN 2.2 (L) 07/16/2021 0331   ALBUMIN 3.2 (L) 11/06/2014 0414   AST 14 (L) 07/16/2021 0331   AST 18 11/06/2014 0414   ALT 12 07/16/2021 0331   ALT 25 11/06/2014 0414   ALKPHOS 42 07/16/2021 0331   ALKPHOS 46 11/06/2014 0414   BILITOT 0.9 07/16/2021 0331   BILITOT 0.7 11/06/2014 0414   GFRNONAA >60 07/16/2021 0331   GFRNONAA >60 12/03/2014 1306   GFRAA >60 03/06/2020 0443   GFRAA >60 12/03/2014 1306   Lipase     Component Value Date/Time   LIPASE 34 03/01/2020 1500       Studies/Results: DG Abd Portable 1V  Result Date: 07/14/2021 CLINICAL DATA:  Feeding tube placement. EXAM: PORTABLE ABDOMEN - 1 VIEW COMPARISON:  07/11/2021 FINDINGS: Feeding tube tip is in the gastric fundus. Upper abdomen demonstrates nonspecific bowel gas pattern. Interstitial changes noted in the lung bases with left base linear atelectasis or scarring. IMPRESSION: Feeding tube tip is in the stomach. Electronically Signed   By: Misty Stanley M.D.   On: 07/14/2021 13:34   ECHOCARDIOGRAM COMPLETE  Result Date: 07/14/2021    ECHOCARDIOGRAM REPORT   Patient Name:  [name] Date of Exam: 07/14/2021 Medical Rec #:  [id number]      Height:       66.0 in Accession #:    [id number]     Weight:       155.6 lb Date of Birth:  25-Jul-1931      BSA:          1.798 m Patient Age:    87 years       BP:           186/52 mmHg Patient Gender: M              HR:           58 bpm. Exam Location:  Inpatient Procedure: 2D Echo, Cardiac Doppler, Color Doppler and Intracardiac            Opacification Agent Indications:     Abnormal EKG  History:         Patient has prior history of Echocardiogram examinations, most                  recent 08/10/2019. COPD, Signs/Symptoms:Dyspnea; Risk                  Factors:Diabetes, Hypertension and Former Smoker. Resp.                  failure, tracheostomy.  Sonographer:     Dustin Flock  RDCS Referring Phys:  [place] Diagnosing Phys: Oswaldo Milian MD  Sonographer Comments: Technically difficult study due to poor echo windows, suboptimal parasternal window, suboptimal apical window and suboptimal subcostal window. Image acquisition challenging due to COPD. IMPRESSIONS  1. Technically difficult study, very poor visualization of left ventricle even after contrast administration. Left ventricular ejection fraction, by estimation, is 55 to 60%. The left ventricle has grossly normal function but very poorly visualized. Left ventricular endocardial border not optimally defined to evaluate regional wall motion. Left ventricular diastolic parameters are indeterminate.  2. Right ventricular systolic function is normal. The right ventricular size is mildly enlarged. Tricuspid regurgitation signal is inadequate for assessing PA pressure.  3. Right atrial size was moderately dilated.  4. A small pericardial effusion is present. The pericardial effusion is anterior to the right ventricle. Presence of pericardial fat pad.  5. The mitral valve is grossly normal. No evidence of mitral valve regurgitation.  6. The aortic valve was not well visualized. Aortic valve regurgitation is not visualized. No aortic stenosis is present.  7. The inferior vena cava is normal in size with greater than 50% respiratory variability, suggesting right atrial pressure of 3 mmHg. FINDINGS  Left Ventricle: Left ventricular ejection fraction, by estimation, is 55 to 60%. The left ventricle has normal function. Left ventricular endocardial border not optimally defined to evaluate regional wall motion. Definity contrast agent was given IV to delineate the left ventricular endocardial borders. The left ventricular internal cavity size was not well visualized. There is not well visualized left ventricular hypertrophy. Left ventricular diastolic parameters are indeterminate. Right Ventricle: The right ventricular size is  mildly enlarged. Right vetricular wall thickness was not well visualized. Right ventricular systolic function is normal. Tricuspid regurgitation signal is inadequate for assessing PA pressure. Left Atrium: Left atrial size was normal in size. Right Atrium: Right atrial size was moderately dilated. Pericardium: A small pericardial effusion is present. The pericardial effusion is anterior to the right ventricle. Presence of pericardial fat pad. Mitral Valve: The mitral valve is grossly normal.  No evidence of mitral valve regurgitation. Tricuspid Valve: The tricuspid valve is normal in structure. Tricuspid valve regurgitation is trivial. Aortic Valve: The aortic valve was not well visualized. Aortic valve regurgitation is not visualized. No aortic stenosis is present. Pulmonic Valve: The pulmonic valve was not well visualized. Pulmonic valve regurgitation is not visualized. Aorta: The aortic root was not well visualized. Venous: The inferior vena cava is normal in size with greater than 50% respiratory variability, suggesting right atrial pressure of 3 mmHg. IAS/Shunts: The interatrial septum was not well visualized.   Diastology LV e' medial:    4.03 cm/s LV E/e' medial:  9.7 LV e' lateral:   4.79 cm/s LV E/e' lateral: 8.1  RIGHT VENTRICLE RV Basal diam:  4.30 cm RV S prime:     9.14 cm/s TAPSE (M-mode): 1.8 cm LEFT ATRIUM           Index       RIGHT ATRIUM           Index LA Vol (A4C): 51.7 ml 28.76 ml/m RA Area:     23.40 cm                                   RA Volume:   73.80 ml  41.05 ml/m  AORTIC VALVE LVOT Vmax:   64.00 cm/s LVOT Vmean:  40.600 cm/s LVOT VTI:    0.131 m MITRAL VALVE MV Area (PHT): 3.15 cm    SHUNTS MV Decel Time: 241 msec    Systemic VTI: 0.13 m MV E velocity: 39.00 cm/s MV A velocity: 48.00 cm/s MV E/A ratio:  0.81 Oswaldo Milian MD Electronically signed by Oswaldo Milian MD Signature Date/Time: 07/14/2021/11:52:38 AM    Final (Updated)     Anti-infectives: Anti-infectives  (From admission, onward)    Start     Dose/Rate Route Frequency Ordered Stop   07/12/21 2200  ceFEPIme (MAXIPIME) 2 g in sodium chloride 0.9 % 100 mL IVPB        2 g 200 mL/hr over 30 Minutes Intravenous Every 12 hours 07/12/21 1555 07/14/21 2359   07/10/21 2200  ceFEPIme (MAXIPIME) 2 g in sodium chloride 0.9 % 100 mL IVPB  Status:  Discontinued        2 g 200 mL/hr over 30 Minutes Intravenous Every 8 hours 07/10/21 1316 07/12/21 1555   07/08/21 0800  vancomycin (VANCOCIN) IVPB 1000 mg/200 mL premix  Status:  Discontinued        1,000 mg 200 mL/hr over 60 Minutes Intravenous Every 24 hours 07/07/21 0747 07/09/21 0855   07/07/21 0900  vancomycin (VANCOREADY) IVPB 1250 mg/250 mL        1,250 mg 166.7 mL/hr over 90 Minutes Intravenous  Once 07/07/21 0747 07/07/21 1412   07/07/21 0900  ceFEPIme (MAXIPIME) 2 g in sodium chloride 0.9 % 100 mL IVPB  Status:  Discontinued        2 g 200 mL/hr over 30 Minutes Intravenous Every 12 hours 07/07/21 0747 07/10/21 1316   07/07/21 0800  cefTRIAXone (ROCEPHIN) 1 g in sodium chloride 0.9 % 100 mL IVPB  Status:  Discontinued        1 g 200 mL/hr over 30 Minutes Intravenous Every 24 hours 07/07/21 0707 07/07/21 0709   07/06/21 1430  fluconazole (DIFLUCAN) IVPB 100 mg  Status:  Discontinued        100 mg 50 mL/hr over 60 Minutes Intravenous Every  24 hours 07/06/21 1341 07/06/21 1404   07/02/21 2100  cefTRIAXone (ROCEPHIN) 1 g in sodium chloride 0.9 % 100 mL IVPB        1 g 200 mL/hr over 30 Minutes Intravenous Every 24 hours 07/02/21 0114 07/06/21 2325   07/01/21 2145  cefTRIAXone (ROCEPHIN) 1 g in sodium chloride 0.9 % 100 mL IVPB        1 g 200 mL/hr over 30 Minutes Intravenous  Once 07/01/21 2141 07/01/21 2235   07/01/21 2145  azithromycin (ZITHROMAX) 500 mg in sodium chloride 0.9 % 250 mL IVPB        500 mg 250 mL/hr over 60 Minutes Intravenous  Once 07/01/21 2141 07/02/21 0000        Assessment/Plan Dysphagia  S/P PEG placement 07/15/21 by Dr.  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

## 2021-07-16 NOTE — Progress Notes (Signed)
  Speech Language Pathology Treatment: Christopher Santiago Speaking valve  Patient Details Name: Christopher Santiago MRN: [id number] DOB: 04-18-1931 Today's Date: 07/16/2021 Time: 7829-5621 SLP Time Calculation (min) (ACUTE ONLY): 28 min  Assessment / Plan / Recommendation Clinical Impression  Pt was seen for PMSV treatment. He was alert and cooperative during the session and he reported that he is eager to speak. Pt tolerated PMSV for over 25 minutes with stable vitals (RR 17-24, SpO2 93-97, HR 71-86) and no evidence of air trapping. Vocal quality was strained and pt exhibited difficulty adequately coordinating respiration with speech. Pt required frequent cues to take intermittent breaks to replenish breath support and he was intermittently responsive to cues. Speech intelligibility was mildly reduced at the conversational level due to vocal quality and impaired respiratory support. Pt was able to call his wife and daughter during the session and he communicated with them with intermittent need for clarification from his family. RR increased some during conversation and SpO2 reduced slightly, but returned to baseline without SLP intervention. Pt was elated by his progress and he is hopeful that he will be able to use the PMSV continuously in the future. For now, SLP recommends that the PMSV be used with all staff given full supervision. SLP will continue to follow pt.    HPI HPI: Pt is an 87 y/o male admitted 8/10 secondary to worsening SOB, especially with exertion. Found to have acute respiratory failure with hypoxia. Imaging showed fullness of the right aryepiglottic fold extending into the right pyriform and L renal mass. Pt had flexible laryngoscopy on 8/10, including trach.  Findings revealed bilateral true vocal folds in paramedian position with normal adduction, restricted abduction causing narrowing of the airway. No evidence of airway obstruction. Mucosal abnormality/ edema of right supraglottis/glottis  noted with no exophytic mass lesion, biopsy showed squamous cell carcinoma. PEG placement 07/15/21.  PMH includes vocal cord cancer ~ 30 years ago, COPD, CAD, DM, HTN, R THA, bilateral hearing aids.      SLP Plan  Continue with current plan of care       Recommendations  Diet recommendations: NPO Medication Administration: Via alternative means      Patient may use Passy-Muir Speech Valve: During all therapies with supervision PMSV Supervision: Full MD: Please consider changing trach tube to : Smaller size         Oral Care Recommendations: Oral care QID Follow up Recommendations: Skilled Nursing facility SLP Visit Diagnosis: Aphonia (R49.1) Plan: Continue with current plan of care       Christopher Santiago, [place], [place] Office number [phone] Pager [phone]                Christopher Santiago 07/16/2021, 10:30 AM

## 2021-07-16 NOTE — TOC Progression Note (Signed)
Transition of Care [facility]) - Progression Note    Patient Details  Name: Christopher Santiago MRN: [id number] Date of Birth: 14-Jan-1931  [facility] CM/SW Contact  [name], LCSW Phone Number: 07/16/2021, 3:18 PM  Clinical Narrative:  CSW spoke with [name] by phone.  She is planning to stay with pt and pt wife, would be able to provide 24/7 support and would be willing to revisit possible CIR if this could lead to pt DC prior to 30 days from the date of the trach placement.  CSW will reach out to CIR.      Expected Discharge Plan: [place] Barriers to Discharge: Continued Medical Work up, SNF Pending bed offer, Other (must enter comment) (pt has new trach placed on 07/02/21)  Expected Discharge Plan and Services Expected Discharge Plan: [place] Choice: [place] arrangements for the past 2 months: Single Family Home                                       Social Determinants of Health (SDOH) Interventions    Readmission Risk Interventions No flowsheet data found.

## 2021-07-16 NOTE — Progress Notes (Signed)
Physical Therapy Treatment Patient Details Name: Christopher Santiago MRN: [id number] DOB: Aug 28, 1931 Today's Date: 07/16/2021    History of Present Illness Pt is an 87 y/o male admitted secondary to worsening SOB, especially with exertion. Found to have acute respiratory failure with hypoxia. Imaging showed fullness of the right aryepiglottic fold extending into the right pyriform and L renal mass. Pt had Flexible laryngoscopy on 8/10. Trach in place. Workup pending. PMH includes vocal cord cancer, COPD, CAD, DM, HTN, and R THA.    PT Comments    Pt admitted with above diagnosis. Pt met 0/3 goals due to several setbacks. REvised goals. Pt is improved today with incr ambulation distance with RW. Continues to need support for mobility but is progressing.  Pt currently with functional limitations due to balance and endurance deficits. Pt will benefit from skilled PT to increase their independence and safety with mobility to allow discharge to the venue listed below.      Follow Up Recommendations  SNF     Equipment Recommendations  Other (comment) (TBD pending progression)    Recommendations for Other Services Rehab consult     Precautions / Restrictions Precautions Precautions: Fall Precaution Comments: Watch O2. Restrictions Weight Bearing Restrictions: No    Mobility  Bed Mobility Overal bed mobility: Needs Assistance Bed Mobility: Supine to Sit Rolling: Supervision Sidelying to sit: Min assist Supine to sit: Min assist     General bed mobility comments: A little assist wtih trunk elevation.    Transfers Overall transfer level: Needs assistance Equipment used: Rolling walker (2 wheeled) Transfers: Sit to/from [facility] Sit to Stand: Min guard Stand pivot transfers: Min guard       General transfer comment: Min guard to stand from bed.  Ambulation/Gait Ambulation/Gait assistance: Min assist;+2 safety/equipment Gait Distance (Feet): 125 Feet Assistive  device: Rolling walker (2 wheeled) Gait Pattern/deviations: Step-through pattern;Decreased stride length;Trunk flexed;Wide base of support Gait velocity: Decreased Gait velocity interpretation: <1.31 ft/sec, indicative of household ambulator General Gait Details: Pt was on 28% trach collar therefore removed O2 and pt sats 89% and greater during session.  Pt able to progress ambulation with RW with cues for upright posture and sequencing steps and rW.  Also cues to stay close to RW.   Stairs             Wheelchair Mobility    Modified Rankin (Stroke Patients Only)       Balance Overall balance assessment: Needs assistance Sitting-balance support: No upper extremity supported;Feet supported Sitting balance-Leahy Scale: Fair Sitting balance - Comments: Patient sat at EOB for 5-8 min with supervision A. Postural control: Other (comment) (anterior lean) Standing balance support: During functional activity;Bilateral upper extremity supported Standing balance-Leahy Scale: Fair Standing balance comment: UE support for ambulation on RW                            Cognition Arousal/Alertness: Awake/alert Behavior During Therapy: WFL for tasks assessed/performed Overall Cognitive Status: Difficult to assess                                 General Comments: Alert/awake with good participation; very motivated this date.      Exercises General Exercises - Lower Extremity Long Arc Quad: AROM;Both;10 reps;Seated Hip Flexion/Marching: AROM;Both;10 reps;Seated    General Comments        Pertinent Vitals/Pain Pain Assessment: No/denies pain  Home Living                      Prior Function            PT Goals (current goals can now be found in the care plan section) Acute Rehab PT Goals Patient Stated Goal: to go home and take care of wife PT Goal Formulation: With patient Time For Goal Achievement: 07/30/21 Potential to Achieve Goals:  Good Progress towards PT goals: Progressing toward goals    Frequency    Min 2X/week      PT Plan Current plan remains appropriate    Co-evaluation       OT goals addressed during session: ADL's and self-care      AM-PAC PT "6 Clicks" Mobility   Outcome Measure  Help needed turning from your back to your side while in a flat bed without using bedrails?: A Little Help needed moving from lying on your back to sitting on the side of a flat bed without using bedrails?: A Little Help needed moving to and from a bed to a chair (including a wheelchair)?: A Little Help needed standing up from a chair using your arms (e.g., wheelchair or bedside chair)?: A Little Help needed to walk in hospital room?: Total Help needed climbing 3-5 steps with a railing? : Total 6 Click Score: 14    End of Session Equipment Utilized During Treatment: Gait belt;Oxygen Activity Tolerance: Patient tolerated treatment well Patient left: with call bell/phone within reach;in chair;with chair alarm set Nurse Communication: Mobility status PT Visit Diagnosis: Unsteadiness on feet (R26.81);Difficulty in walking, not elsewhere classified (R26.2)     Time: 2951-8841 PT Time Calculation (min) (ACUTE ONLY): 36 min  Charges:  $Gait Training: 23-37 mins                     Delyla Sandeen M,PT Acute Rehab Services [phone] (pager)    Alvira Philips 07/16/2021, 2:12 PM

## 2021-07-16 NOTE — Progress Notes (Signed)
PROGRESS NOTE  Christopher Santiago WJX:[id number] DOB: 23-Aug-1931 DOA: 07/01/2021 PCP: Tonia Ghent, MD   LOS: 15 days   Brief Narrative / Interim history:  87 year old with past medical history significant for COPD, quit smoking 30 years ago, vocal cord cancer, diabetes who presents complaining of exertional dyspnea, reports sinusitis, postnasal drip, hoarseness for the past 3 to 4 months. He was also complaining of difficulty swallowing, food gets stuck in the throat, he also report phlegm, in his throat. CT maxillofacial, soft tissue neck: Asymmetric soft tissue density/fullness at the base of the right aryepiglottic fold, extending into the right piriform sinus with possible involvement of the right glottis inferiorly.  If echo to measure or define a discrete mass within this region given lack of IV contrast. ENT consulted for direct visualization.  Patient underwent  laryngoscopy with biopsy and tracheostomy 8/10.  Currently getting PEG tube placement then will need disposition, cancer treatment with radiation therapy was offered following discharge  Subjective / 24h Interval events:  He denies any complaints today, no significant events as discussed  Assessment & Plan:  Acute Hypoxic Respiratory Failure, tracheostomy in place -Suspect related to component of aspiration due to dysphagia larynx CA, COPD exacerbation.   -He has completed 5 days of IV steroids  - completed  7 days of cefepime with last day 8/21 -  respiratory status has remained stable and wheezing has resolved -respiratory culture with normal respiratory flora -Continue Mucomyst 3 times daily -ENT managing trach care -Secretions overall improving   Squamous cell carcinoma Larynx-CT: With asymmetric soft tissue density fullness at the base of the right aryepiglottic fold: ENT consulted.  Patient underwent laryngoscopy with biopsy and tracheostomy 8/11. Stable on ATC 5 L oxygen.  -Biopsy showed squamous cell carcinoma  with foci suspicious for invasion.  -He was discussed at tumor board, will have outpatient follow-up with medical oncology, radiation oncology.  CT scan of the neck and chest showing that the pulmonary nodules have not progressed since 2019 therefore benign -Trach changed to Shiley 6 Prox XLT Cuffless.   Nutrition-getting PEG tube placement today  Acute metabolic encephalopathy- intermittent, in the setting of acute illness/hospitalization.  Ativan as needed, hold if too sedated  1.2 cm spiculated right upper lobe nodule-outpatient follow up   Left Renal mass -Concerning for renal carcinoma, will need to follow-up with urology as an outpatient   Diabetes mellitus - Not on medication. Continue SSI  HTN -Resume metoprolol, Continue with Norvasc.    Leukocytosis probably related to steroids vs infection.  Resolved    Scheduled Meds:  acetaminophen  500 mg Per Tube TID   allopurinol  100 mg Oral Daily   amLODipine  5 mg Oral Daily   arformoterol  15 mcg Nebulization BID   budesonide (PULMICORT) nebulizer solution  0.25 mg Nebulization BID   chlorhexidine gluconate (MEDLINE KIT)  15 mL Mouth Rinse BID   Chlorhexidine Gluconate Cloth  6 each Topical Daily   dextrose  1 ampule Intravenous Once   feeding supplement (PROSource TF)  45 mL Per Tube BID   free water  150 mL Per Tube Q4H   guaiFENesin  10 mL Oral Q8H   insulin aspart  0-5 Units Subcutaneous QHS   insulin aspart  0-9 Units Subcutaneous TID WC   lidocaine  1 application Urethral Once   mouth rinse  15 mL Mouth Rinse 10 times per day   nystatin  5 mL Oral QID   revefenacin  175 mcg Nebulization Daily  scopolamine  1 patch Transdermal Q72H   sennosides  5 mL Oral BID   sodium chloride flush  10-40 mL Intracatheter Q12H   Continuous Infusions:  dextrose 10 % and 0.45 % NaCl 50 mL/hr at 07/16/21 1100   feeding supplement (OSMOLITE 1.5 CAL) 1,000 mL (07/16/21 0838)   PRN Meds:.albuterol, bisacodyl, hydrALAZINE, ketorolac,  lip balm, LORazepam, sodium chloride flush, traMADol  Diet Orders (From admission, onward)     Start     Ordered   07/03/21 1433  Diet NPO time specified Except for: Ice Chips  Diet effective now       Comments: Occasional ice chips after oral care  Question:  Except for  Answer:  Ice Chips   07/03/21 1432            DVT prophylaxis: Place and maintain sequential compression device Start: 07/04/21 1027     Code Status: DNR  Family Communication: none at bedside  Status is: Inpatient  Remains inpatient appropriate because:IV treatments appropriate due to intensity of illness or inability to take PO  Dispo: The patient is from: Home              Anticipated d/c is to: SNF vs CIR              Patient currently is not medically stable to d/c.   Difficult to place patient No  Level of care: Progressive  Consultants:  ENT Trauma surgery for PEG Palliaitve  PCCM  Procedures:  Laryngoscopy / trach 2d echo IMPRESSIONS   1. Technically difficult study, very poor visualization of left ventricle even after contrast administration. Left ventricular ejection fraction, by estimation, is 55 to 60%. The left ventricle has grossly normal function but very poorly visualized. Left ventricular endocardial border not optimally defined to evaluate regional wall motion. Left ventricular diastolic parameters are indeterminate.   2. Right ventricular systolic function is normal. The right ventricular size is mildly enlarged. Tricuspid regurgitation signal is inadequate for assessing PA pressure.   3. Right atrial size was moderately dilated.   4. A small pericardial effusion is present. The pericardial effusion is anterior to the right ventricle. Presence of pericardial fat pad.   5. The mitral valve is grossly normal. No evidence of mitral valve regurgitation.   6. The aortic valve was not well visualized. Aortic valve regurgitation is not visualized. No aortic stenosis is present.   7. The  inferior vena cava is normal in size with greater than 50% respiratory variability, suggesting right atrial pressure of 3 mmHg.   Microbiology  none  Antimicrobials: None   Objective: Vitals:   07/16/21 0810 07/16/21 0900 07/16/21 1149 07/16/21 1156  BP:  (!) 131/54  (!) 105/40  Pulse: 76 66 76 72  Resp: 18 20 (!) 24 17  Temp:  98 F (36.7 C)  98.6 F (37 C)  TempSrc:  Oral  Oral  SpO2: 100% 100% 100% 99%  Weight:      Height:        Intake/Output Summary (Last 24 hours) at 07/16/2021 1422 Last data filed at 07/16/2021 0700 Gross per 24 hour  Intake 393.58 ml  Output 700 ml  Net -306.42 ml   Filed Weights   07/08/21 0505 07/11/21 0506 07/15/21 0610  Weight: 71.8 kg 70.6 kg 69 kg    Examination:  Awake Alert, Oriented X 3, frail, deconditioned Symmetrical Chest wall movement, trach present with some increased secretions, no wheezing, coarse air entry. RRR,No Gallops,Rubs or new Murmurs,  No Parasternal Heave +ve B.Sounds, Abd Soft, No tenderness, No rebound - guarding or rigidity. No Cyanosis, Clubbing or edema, No new Rash or bruise      Data Reviewed: I have independently reviewed following labs and imaging studies   CBC: Recent Labs  Lab 07/11/21 0304 07/12/21 0124 07/13/21 0321 07/16/21 0331  WBC 10.1 9.4 10.1 10.9*  HGB 11.1* 10.6* 10.8* 10.5*  HCT 35.1* 32.4* 33.8* 32.1*  MCV 93.9 93.1 94.4 94.1  PLT 156 157 185 616   Basic Metabolic Panel: Recent Labs  Lab 07/11/21 0304 07/12/21 0124 07/13/21 0321 07/16/21 0331  NA 135 131* 136 139  K 4.9 4.7 4.4 4.0  CL 102 100 103 107  CO2 26 26 27 27   GLUCOSE 159* 141* 103* 123*  BUN 26* 26* 20 14  CREATININE 0.83 0.83 0.85 0.85  CALCIUM 9.1 8.8* 9.0 9.0   Liver Function Tests: Recent Labs  Lab 07/11/21 0304 07/16/21 0331  AST 13* 14*  ALT 14 12  ALKPHOS 56 42  BILITOT 0.3 0.9  PROT 6.1* 5.7*  ALBUMIN 2.4* 2.2*   Coagulation Profile: No results for input(s): INR, PROTIME in the last 168  hours. HbA1C: No results for input(s): HGBA1C in the last 72 hours. CBG: Recent Labs  Lab 07/15/21 2059 07/16/21 0102 07/16/21 0400 07/16/21 0808 07/16/21 1154  GLUCAP 85 106* 107* 109* 79    No results found for this or any previous visit (from the past 240 hour(s)).     Radiology Studies: No results found.   Phillips Climes MD [facility]  Between 7 am - 7 pm I am available, please contact me via Amion (for emergencies) or Securechat (non urgent messages)  Between 7 pm - 7 am I am not available, please contact night coverage MD/APP via Amion

## 2021-07-16 NOTE — Progress Notes (Signed)
Occupational Therapy Treatment Patient Details Name: Christopher Santiago MRN: [id number] DOB: [birth year] Today's Date: 07/16/2021    History of present illness Pt is an 87 y/o male admitted secondary to worsening SOB, especially with exertion. Found to have acute respiratory failure with hypoxia. Imaging showed fullness of the right aryepiglottic fold extending into the right pyriform and L renal mass. Pt had Flexible laryngoscopy on 8/10. Trach in place. Workup pending. PMH includes vocal cord cancer, COPD, CAD, DM, HTN, and R THA.   OT comments  Treatment focused on standing grooming task and functional mobility needed for ADLs. Overall patient min guard for mobility with RW and limited mostly by lines, leads and oxygen. Patient able to stand at sink approx 5 minutes for grooming tasks. Vital signs monitored and maintained WFL. Patient pleasant and motivated. Cont POC.   Follow Up Recommendations  SNF    Equipment Recommendations  None recommended by OT    Recommendations for Other Services      Precautions / Restrictions Precautions Precautions: Fall Precaution Comments: Watch O2. Restrictions Weight Bearing Restrictions: No       Mobility Bed Mobility               General bed mobility comments: up in chair    Transfers   Equipment used: Rolling walker (2 wheeled) Transfers: Sit to/from [facility] Sit to Stand: Min guard Stand pivot transfers: Min guard       General transfer comment: Min guard to stand from recliner. Min guard to ambulate to sink to perform grooming and then return to recliner. Therapist assisted with line management.    Balance Overall balance assessment: Needs assistance Sitting-balance support: No upper extremity supported;Feet supported Sitting balance-Leahy Scale: Fair     Standing balance support: During functional activity Standing balance-Leahy Scale: Fair Standing balance comment: standing at sink without UE support  to perform grooming task                           ADL either performed or assessed with clinical judgement   ADL Overall ADL's : Needs assistance/impaired     Grooming: Set up;Min guard;Standing;Oral care;Wash/dry hands;Wash/dry face Grooming Details (indicate cue type and reason): stood at sink to perform oral care with strict instructions to not swallow any water. Patient stood approx 5 minutes at sink to perform grooming tasks                                     Vision Patient Visual Report: No change from baseline Vision Assessment?: No apparent visual deficits   Perception     Praxis      Cognition Arousal/Alertness: Awake/alert Behavior During Therapy: WFL for tasks assessed/performed Overall Cognitive Status: Difficult to assess                                          Exercises     Shoulder Instructions       General Comments      Pertinent Vitals/ Pain       Pain Assessment: No/denies pain  Home Living  Prior Functioning/Environment              Frequency  Min 2X/week        Progress Toward Goals  OT Goals(current goals can now be found in the care plan section)  Progress towards OT goals: Progressing toward goals  Acute Rehab OT Goals Patient Stated Goal: to go home and take care of wife OT Goal Formulation: With patient Time For Goal Achievement: 07/17/21 Potential to Achieve Goals: Good  Plan Discharge plan remains appropriate    Co-evaluation          OT goals addressed during session: ADL's and self-care      AM-PAC OT "6 Clicks" Daily Activity     Outcome Measure   Help from another person eating meals?: Total (NPO) Help from another person taking care of personal grooming?: A Little Help from another person toileting, which includes using toliet, bedpan, or urinal?: A Little Help from another person bathing (including  washing, rinsing, drying)?: A Little Help from another person to put on and taking off regular upper body clothing?: A Little Help from another person to put on and taking off regular lower body clothing?: A Lot 6 Click Score: 15    End of Session Equipment Utilized During Treatment: Oxygen;Other (comment);Rolling walker  OT Visit Diagnosis: Unsteadiness on feet (R26.81);Muscle weakness (generalized) (M62.81)   Activity Tolerance Patient tolerated treatment well   Patient Left in chair;with call bell/phone within reach;with chair alarm set   Nurse Communication  (IV leaking)        Time: 7654-6503 OT Time Calculation (min): 22 min  Charges: OT General Charges $OT Visit: 1 Visit OT Treatments $Self Care/Home Management : 8-22 mins  Derl Barrow, OTR/L [place]  Office [phone] Pager: [place] 07/16/2021, 1:47 PM

## 2021-07-17 LAB — BASIC METABOLIC PANEL
Anion gap: 6 (ref 5–15)
BUN: 18 mg/dL (ref 8–23)
CO2: 25 mmol/L (ref 22–32)
Calcium: 9.2 mg/dL (ref 8.9–10.3)
Chloride: 107 mmol/L (ref 98–111)
Creatinine, Ser: 0.77 mg/dL (ref 0.61–1.24)
GFR, Estimated: >60 mL/min (ref >60)
Glucose, Bld: 150 mg/dL — ABNORMAL HIGH (ref 70–99)
Potassium: 4 mmol/L (ref 3.5–5.1)
Sodium: 138 mmol/L (ref 135–145)

## 2021-07-17 LAB — CBC
HCT: 33.1 % — ABNORMAL LOW (ref 39.0–52.0)
Hemoglobin: 10.5 g/dL — ABNORMAL LOW (ref 13.0–17.0)
MCH: 30.6 pg (ref 26.0–34.0)
MCHC: 31.7 g/dL (ref 30.0–36.0)
MCV: 96.5 fL (ref 80.0–100.0)
Platelets: 184 10*3/uL (ref 150–400)
RBC: 3.43 MIL/uL — ABNORMAL LOW (ref 4.22–5.81)
RDW: 13.2 % (ref 11.5–15.5)
WBC: 9.9 10*3/uL (ref 4.0–10.5)
nRBC: 0 % (ref 0.0–0.2)

## 2021-07-17 LAB — GLUCOSE, CAPILLARY
Glucose-Capillary: 108 mg/dL — ABNORMAL HIGH (ref 70–99)
Glucose-Capillary: 114 mg/dL — ABNORMAL HIGH (ref 70–99)
Glucose-Capillary: 130 mg/dL — ABNORMAL HIGH (ref 70–99)
Glucose-Capillary: 86 mg/dL (ref 70–99)
Glucose-Capillary: 95 mg/dL (ref 70–99)

## 2021-07-17 LAB — PHOSPHORUS: Phosphorus: 3.5 mg/dL (ref 2.5–4.6)

## 2021-07-17 NOTE — Progress Notes (Signed)
PROGRESS NOTE  Christopher Santiago WCH:[id number] DOB: 11-08-1931 DOA: 07/01/2021 PCP: Tonia Ghent, MD   LOS: 16 days   Brief Narrative / Interim history:  87 year old with past medical history significant for COPD, quit smoking 30 years ago, vocal cord cancer, diabetes who presents complaining of exertional dyspnea, reports sinusitis, postnasal drip, hoarseness for the past 3 to 4 months. He was also complaining of difficulty swallowing, food gets stuck in the throat, he also report phlegm, in his throat. CT maxillofacial, soft tissue neck: Asymmetric soft tissue density/fullness at the base of the right aryepiglottic fold, extending into the right piriform sinus with possible involvement of the right glottis inferiorly.  If echo to measure or define a discrete mass within this region given lack of IV contrast. ENT consulted for direct visualization.  Patient underwent  laryngoscopy with biopsy and tracheostomy 8/10.  Currently getting PEG tube placement then will need disposition, cancer treatment with radiation therapy was offered following discharge  Subjective / 24h Interval events:  Patient denies any complaints today, no significant events overnight as discussed with staff.  Assessment & Plan:  Acute Hypoxic Respiratory Failure, tracheostomy in place -Suspect related to component of aspiration due to dysphagia larynx CA, COPD exacerbation.   -He has completed 5 days of IV steroids  - completed  7 days of cefepime with last day 8/21 - Respiratory status has remained stable and wheezing has resolved -respiratory culture with normal respiratory flora -Continue Mucomyst 3 times daily -ENT managing trach care -Secretions overall improving, continue with scopolamine patch  Squamous cell carcinoma Larynx-CT: With asymmetric soft tissue density fullness at the base of the right aryepiglottic fold: ENT consulted.  Patient underwent laryngoscopy with biopsy and tracheostomy 8/11. Stable on ATC  5 L oxygen.  -Biopsy showed squamous cell carcinoma with foci suspicious for invasion.  -He was discussed at tumor board, will have outpatient follow-up with medical oncology, radiation oncology.  CT scan of the neck and chest showing that the pulmonary nodules have not progressed since 2019 therefore benign -Trach changed to Shiley 6 Prox XLT Cuffless.  Management per ENT  Nutrition-getting PEG tube placement today  Acute metabolic encephalopathy- intermittent, in the setting of acute illness/hospitalization.  Ativan as needed, hold if too sedated  1.2 cm spiculated right upper lobe nodule-outpatient follow up   Left Renal mass -Concerning for renal carcinoma, will need to follow-up with urology as an outpatient   Diabetes mellitus - Not on medication. Continue SSI  HTN -Resume metoprolol, Continue with Norvasc.    Leukocytosis probably related to steroids vs infection.  Resolved    Scheduled Meds:  acetaminophen  500 mg Per Tube TID   allopurinol  100 mg Oral Daily   amLODipine  5 mg Oral Daily   arformoterol  15 mcg Nebulization BID   budesonide (PULMICORT) nebulizer solution  0.25 mg Nebulization BID   chlorhexidine gluconate (MEDLINE KIT)  15 mL Mouth Rinse BID   Chlorhexidine Gluconate Cloth  6 each Topical Daily   feeding supplement (PROSource TF)  45 mL Per Tube BID   free water  150 mL Per Tube Q4H   guaiFENesin  10 mL Oral Q8H   insulin aspart  0-5 Units Subcutaneous QHS   insulin aspart  0-9 Units Subcutaneous TID WC   lidocaine  1 application Urethral Once   mouth rinse  15 mL Mouth Rinse 10 times per day   nystatin  5 mL Oral QID   revefenacin  175 mcg Nebulization Daily  scopolamine  1 patch Transdermal Q72H   sennosides  5 mL Oral BID   sodium chloride flush  10-40 mL Intracatheter Q12H   Continuous Infusions:  dextrose 10 % and 0.45 % NaCl 50 mL/hr at 07/17/21 0932   feeding supplement (OSMOLITE 1.5 CAL) 40 mL/hr at 07/17/21 0025   PRN Meds:.albuterol,  bisacodyl, hydrALAZINE, ketorolac, lip balm, LORazepam, sodium chloride flush, traMADol  Diet Orders (From admission, onward)     Start     Ordered   07/03/21 1433  Diet NPO time specified Except for: Ice Chips  Diet effective now       Comments: Occasional ice chips after oral care  Question:  Except for  Answer:  Ice Chips   07/03/21 1432            DVT prophylaxis: Place and maintain sequential compression device Start: 07/04/21 1027     Code Status: DNR  Family Communication: none at bedside  Status is: Inpatient  Remains inpatient appropriate because:IV treatments appropriate due to intensity of illness or inability to take PO  Dispo: The patient is from: Home              Anticipated d/c is to: SNF vs CIR              Patient currently is not medically stable to d/c.   Difficult to place patient No  Level of care: Progressive  Consultants:  ENT Trauma surgery for PEG Palliaitve  PCCM  Procedures:  Laryngoscopy / trach 2d echo IMPRESSIONS   1. Technically difficult study, very poor visualization of left ventricle even after contrast administration. Left ventricular ejection fraction, by estimation, is 55 to 60%. The left ventricle has grossly normal function but very poorly visualized. Left ventricular endocardial border not optimally defined to evaluate regional wall motion. Left ventricular diastolic parameters are indeterminate.   2. Right ventricular systolic function is normal. The right ventricular size is mildly enlarged. Tricuspid regurgitation signal is inadequate for assessing PA pressure.   3. Right atrial size was moderately dilated.   4. A small pericardial effusion is present. The pericardial effusion is anterior to the right ventricle. Presence of pericardial fat pad.   5. The mitral valve is grossly normal. No evidence of mitral valve regurgitation.   6. The aortic valve was not well visualized. Aortic valve regurgitation is not visualized. No  aortic stenosis is present.   7. The inferior vena cava is normal in size with greater than 50% respiratory variability, suggesting right atrial pressure of 3 mmHg.   Microbiology  none  Antimicrobials: None   Objective: Vitals:   07/17/21 0743 07/17/21 0745 07/17/21 0916 07/17/21 1122  BP:   (!) 138/53   Pulse:    (!) 56  Resp:    14  Temp:      TempSrc:      SpO2: 100% 100%  100%  Weight:      Height:        Intake/Output Summary (Last 24 hours) at 07/17/2021 1429 Last data filed at 07/17/2021 0545 Gross per 24 hour  Intake 1008.64 ml  Output 850 ml  Net 158.64 ml   Filed Weights   07/08/21 0505 07/11/21 0506 07/15/21 0610  Weight: 71.8 kg 70.6 kg 69 kg    Examination:  Awake Alert, very pleasant, frail, deconditioned . Symmetrical Chest wall movement, trach present.no wheezing. RRR,No Gallops,Rubs or new Murmurs, No Parasternal Heave +ve B.Sounds, Abd Soft, No tenderness, No rebound - guarding or  rigidity. No Cyanosis, Clubbing or edema, No new Rash or bruise      Data Reviewed: I have independently reviewed following labs and imaging studies   CBC: Recent Labs  Lab 07/11/21 0304 07/12/21 0124 07/13/21 0321 07/16/21 0331 07/17/21 0321  WBC 10.1 9.4 10.1 10.9* 9.9  HGB 11.1* 10.6* 10.8* 10.5* 10.5*  HCT 35.1* 32.4* 33.8* 32.1* 33.1*  MCV 93.9 93.1 94.4 94.1 96.5  PLT 156 157 185 194 886   Basic Metabolic Panel: Recent Labs  Lab 07/11/21 0304 07/12/21 0124 07/13/21 0321 07/16/21 0331 07/17/21 0321  NA 135 131* 136 139 138  K 4.9 4.7 4.4 4.0 4.0  CL 102 100 103 107 107  CO2 _0 GLUCOSE 159* 141* 103* 123* 150*  BUN 26* 26* _1 CREATININE 0.83 0.83 0.85 0.85 0.77  CALCIUM 9.1 8.8* 9.0 9.0 9.2  PHOS  --   --   --   --  3.5   Liver Function Tests: Recent Labs  Lab 07/11/21 0304 07/16/21 0331  AST 13* 14*  ALT 14 12  ALKPHOS 56 42  BILITOT 0.3 0.9  PROT 6.1* 5.7*  ALBUMIN 2.4* 2.2*   Coagulation Profile: No results  for input(s): INR, PROTIME in the last 168 hours. HbA1C: No results for input(s): HGBA1C in the last 72 hours. CBG: Recent Labs  Lab 07/16/21 1154 07/16/21 1618 07/16/21 2003 07/17/21 0742 07/17/21 1232  GLUCAP 79 80 137* 130* 114*    No results found for this or any previous visit (from the past 240 hour(s)).     Radiology Studies: No results found.   Phillips Climes MD [facility]  Between 7 am - 7 pm I am available, please contact me via Amion (for emergencies) or Securechat (non urgent messages)  Between 7 pm - 7 am I am not available, please contact night coverage MD/APP via Amion

## 2021-07-17 NOTE — Plan of Care (Signed)
  Problem: Education: Goal: Knowledge of General Education information will improve Description: Including pain rating scale, medication(s)/side effects and non-pharmacologic comfort measures Outcome: Progressing   Problem: Health Behavior/Discharge Planning: Goal: Ability to manage health-related needs will improve Outcome: Progressing   Problem: Clinical Measurements: Goal: Ability to maintain clinical measurements within normal limits will improve Outcome: Progressing Goal: Will remain free from infection Outcome: Progressing Goal: Diagnostic test results will improve Outcome: Progressing Goal: Respiratory complications will improve Outcome: Progressing Goal: Cardiovascular complication will be avoided Outcome: Progressing   Problem: Activity: Goal: Risk for activity intolerance will decrease Outcome: Progressing   Problem: Nutrition: Goal: Adequate nutrition will be maintained Outcome: Progressing   Problem: Coping: Goal: Level of anxiety will decrease Outcome: Progressing   Problem: Elimination: Goal: Will not experience complications related to bowel motility Outcome: Progressing Goal: Will not experience complications related to urinary retention Outcome: Progressing   Problem: Pain Managment: Goal: General experience of comfort will improve Outcome: Progressing   Problem: Safety: Goal: Ability to remain free from injury will improve Outcome: Progressing   Problem: Skin Integrity: Goal: Risk for impaired skin integrity will decrease Outcome: Progressing   Problem: Education: Goal: Knowledge about tracheostomy care/management will improve Outcome: Progressing   Problem: Activity: Goal: Ability to tolerate increased activity will improve Outcome: Progressing   Problem: Health Behavior/Discharge Planning: Goal: Ability to manage tracheostomy will improve Outcome: Progressing   Problem: Respiratory: Goal: Patent airway maintenance will  improve Outcome: Progressing   Problem: Role Relationship: Goal: Ability to communicate will improve Outcome: Progressing   Problem: Safety: Goal: Non-violent Restraint(s) Outcome: Progressing

## 2021-07-17 NOTE — Progress Notes (Signed)
  Speech Language Pathology Treatment: Dysphagia;Passy Muir Speaking valve  Patient Details Name: Christopher Santiago MRN: [id number] DOB: 18-Oct-1931 Today's Date: 07/17/2021 Time: 6720-9470 SLP Time Calculation (min) (ACUTE ONLY): 33 min  Assessment / Plan / Recommendation Clinical Impression  Pt was seen for treatment. Pt's nurse reported that the pt wore the PMSV for approximately two hours, but that he began to desat to the 80s and it was therefore removed. Pt's nurse further reported that the pt becomes increasingly confused as the day progresses and pt's processing speed was noted to be slower than when he was last seen in the morning. Pt tolerated PMSV for 30 minutes with stable vitals (RR 17-20, SpO2 95-100, HR 72-76), and without evidence of air trapping. Pt was able to participate in conversation and his vocal intensity was improved compared to yesterday. Vocal quality was less hoarse compared to yesterday and speech intelligibility was functional. Pt's wife, who was contacted again via phone per pt's request, she reported that the pt's voice was "stronger" and she was able to understand "most everything he said". She verbalized understanding regarding pt's progress and SLP recommendations. Pt was independent with oral suctioning and was able to orally expectorate clear secretions throughout the session. A wet vocal quality and coughing were intermittently noted with ice chips, but this improved with coughing and oral suctioning. Pt completed limited reps of effortful swallows, but he progressively required more support for completion of this exercise. Dysphagia exercises were ultimately terminated when pt began demonstrating breath holding while appearing to simultaneously blow, and cueing was ineffective. SLP will continue to follow pt.    HPI HPI: Pt is an 87 y/o male admitted 8/10 secondary to worsening SOB, especially with exertion. Found to have acute respiratory failure with hypoxia. Imaging  showed fullness of the right aryepiglottic fold extending into the right pyriform and L renal mass. Pt had flexible laryngoscopy on 8/10, including trach.  Findings revealed bilateral true vocal folds in paramedian position with normal adduction, restricted abduction causing narrowing of the airway. No evidence of airway obstruction. Mucosal abnormality/ edema of right supraglottis/glottis noted with no exophytic mass lesion, biopsy showed squamous cell carcinoma. PEG placement 07/15/21.  PMH includes vocal cord cancer ~ 30 years ago, COPD, CAD, DM, HTN, R THA, bilateral hearing aids.      SLP Plan  Continue with current plan of care       Recommendations  Diet recommendations: NPO Medication Administration: Via alternative means      Patient may use Passy-Muir Speech Valve: During all therapies with supervision PMSV Supervision: Intermittent MD: Please consider changing trach tube to : Smaller size         Oral Care Recommendations: Oral care QID Follow up Recommendations: Skilled Nursing facility SLP Visit Diagnosis: Aphonia (R49.1);Dysphagia, unspecified (R13.10) Plan: Continue with current plan of care       Ikeem Cleckler I. Hardin Negus, [place], [place] Office number [phone] Pager [phone]                [name] 07/17/2021, 5:18 PM

## 2021-07-17 NOTE — Progress Notes (Signed)
Inpatient Rehab Admissions Coordinator:   Consult received. I met with pt and his daughter, Kelly, at the bedside.  Reviewed CIR recommendations, average length of stay to be about 2 weeks (dependent upon progress), and goals of supervision to modified independent.  Kelly does confirm that she plans to stay with the patient at discharge.  I let them know that I will need to pursue prior authorization with [employer] and that this may take some time.  I will start today and provide updates as available.     , PT, DPT Admissions Coordinator [phone] 07/17/21  3:43 PM  

## 2021-07-18 DIAGNOSIS — J9601 Acute respiratory failure with hypoxia: Secondary | ICD-10-CM | POA: Diagnosis not present

## 2021-07-18 DIAGNOSIS — N2889 Other specified disorders of kidney and ureter: Secondary | ICD-10-CM | POA: Diagnosis not present

## 2021-07-18 LAB — BASIC METABOLIC PANEL
Anion gap: 8 (ref 5–15)
BUN: 15 mg/dL (ref 8–23)
CO2: 24 mmol/L (ref 22–32)
Calcium: 8.9 mg/dL (ref 8.9–10.3)
Chloride: 104 mmol/L (ref 98–111)
Creatinine, Ser: 0.65 mg/dL (ref 0.61–1.24)
GFR, Estimated: >60 mL/min (ref >60)
Glucose, Bld: 123 mg/dL — ABNORMAL HIGH (ref 70–99)
Potassium: 3.5 mmol/L (ref 3.5–5.1)
Sodium: 136 mmol/L (ref 135–145)

## 2021-07-18 LAB — CBC
HCT: 31.7 % — ABNORMAL LOW (ref 39.0–52.0)
Hemoglobin: 10.3 g/dL — ABNORMAL LOW (ref 13.0–17.0)
MCH: 30.6 pg (ref 26.0–34.0)
MCHC: 32.5 g/dL (ref 30.0–36.0)
MCV: 94.1 fL (ref 80.0–100.0)
Platelets: 175 10*3/uL (ref 150–400)
RBC: 3.37 MIL/uL — ABNORMAL LOW (ref 4.22–5.81)
RDW: 13.2 % (ref 11.5–15.5)
WBC: 9 10*3/uL (ref 4.0–10.5)
nRBC: 0 % (ref 0.0–0.2)

## 2021-07-18 LAB — PHOSPHORUS: Phosphorus: 2.4 mg/dL — ABNORMAL LOW (ref 2.5–4.6)

## 2021-07-18 LAB — GLUCOSE, CAPILLARY
Glucose-Capillary: 114 mg/dL — ABNORMAL HIGH (ref 70–99)
Glucose-Capillary: 123 mg/dL — ABNORMAL HIGH (ref 70–99)
Glucose-Capillary: 136 mg/dL — ABNORMAL HIGH (ref 70–99)
Glucose-Capillary: 142 mg/dL — ABNORMAL HIGH (ref 70–99)
Glucose-Capillary: 78 mg/dL (ref 70–99)
Glucose-Capillary: 87 mg/dL (ref 70–99)

## 2021-07-18 NOTE — Progress Notes (Signed)
Occupational Therapy Treatment Patient Details Name: Christopher Santiago MRN: [id number] DOB: Dec 12, 1930 Today's Date: 07/18/2021    History of present illness Pt is an 87 y/o male admitted secondary to worsening SOB, especially with exertion. Found to have acute respiratory failure with hypoxia. Imaging showed fullness of the right aryepiglottic fold extending into the right pyriform and L renal mass. Pt had Flexible laryngoscopy on 8/10. Trach in place. Workup pending. PMH includes vocal cord cancer, COPD, CAD, DM, HTN, and R THA.   OT comments  OT treatment session with focus on self-care re-education, activity tolerance, functional transfers and mobility. Patient able to take steps to sink in hospital room and stand for 2/3 grooming tasks with Min guard. Session limited by symptomatic orthostatic hypotension with BP dropping to 79/53 after standing for several minutes (118/59 seated EOB prior to grooming at sink). Patient continues to be motivated and would be a great candidate for CIR therapies. OT will continue to follow acutely.    Follow Up Recommendations  CIR    Equipment Recommendations  None recommended by OT    Recommendations for Other Services Rehab consult    Precautions / Restrictions Precautions Precautions: Fall Precaution Comments: Monitor orthostatic vitals and SpO2; abdominal binder (loose) to keep PEG in place Restrictions Weight Bearing Restrictions: No       Mobility Bed Mobility Overal bed mobility: Needs Assistance Bed Mobility: Supine to Sit Rolling: Supervision Sidelying to sit: Min guard       General bed mobility comments: Min guard to bring trunk upright; did not require external assist this date.    Transfers Overall transfer level: Needs assistance Equipment used: Rolling walker (2 wheeled) Transfers: Sit to/from [facility] Sit to Stand: Min guard Stand pivot transfers: Min guard            Balance Overall balance  assessment: Needs assistance Sitting-balance support: No upper extremity supported;Feet supported Sitting balance-Leahy Scale: Fair   Postural control: Other (comment) (anterior lean) Standing balance support: During functional activity;Bilateral upper extremity supported Standing balance-Leahy Scale: Fair Standing balance comment: UE support for ambulation on RW; able to maintain static standing balance during grooming at sink level with Min guard to supervision A.                           ADL either performed or assessed with clinical judgement   ADL Overall ADL's : Needs assistance/impaired     Grooming: Min guard;Standing Grooming Details (indicate cue type and reason): 2/3 grooming tasks standing at sink level with Min gaurd to supervision A for safety. Min cues for sequencing.                                     Vision       Perception     Praxis      Cognition Arousal/Alertness: Awake/alert Behavior During Therapy: WFL for tasks assessed/performed Overall Cognitive Status: Difficult to assess                                 General Comments: Alert/awake with good participation; very motivated this date; decreased STM at baseline 2/2 dementia        Exercises     Shoulder Instructions       General Comments Abdominal binder donned loosly seated EOB  to keep PEG in place. Positive orthostatic vitals with BP 118/59 at EOB, 92/50 in standing, 79/52 after 2-3 min in standing and 119/61 seated in recliner after several minutes.    Pertinent Vitals/ Pain       Pain Assessment: No/denies pain  Home Living                                          Prior Functioning/Environment              Frequency  Min 2X/week        Progress Toward Goals  OT Goals(current goals can now be found in the care plan section)  Progress towards OT goals: Progressing toward goals  Acute Rehab OT Goals Patient  Stated Goal: To go to CIR OT Goal Formulation: With patient ADL Goals Pt Will Perform Eating: Independently Pt Will Perform Grooming: with modified independence;standing Pt Will Perform Upper Body Dressing: Independently Pt Will Perform Lower Body Dressing: with modified independence;sit to/from stand Pt Will Transfer to Toilet: with modified independence;ambulating Pt Will Perform Toileting - Clothing Manipulation and hygiene: with modified independence;sit to/from stand Pt Will Perform Tub/Shower Transfer: Shower transfer;Tub transfer;rolling walker;shower seat Pt/caregiver will Perform Home Exercise Program: Increased ROM;Increased strength;Both right and left upper extremity  Plan Discharge plan needs to be updated;Frequency remains appropriate    Co-evaluation                 AM-PAC OT "6 Clicks" Daily Activity     Outcome Measure   Help from another person eating meals?: Total (NPO) Help from another person taking care of personal grooming?: A Little Help from another person toileting, which includes using toliet, bedpan, or urinal?: A Little Help from another person bathing (including washing, rinsing, drying)?: A Little Help from another person to put on and taking off regular upper body clothing?: A Little Help from another person to put on and taking off regular lower body clothing?: A Lot 6 Click Score: 15    End of Session Equipment Utilized During Treatment: Oxygen;Other (comment);Rolling walker  OT Visit Diagnosis: Unsteadiness on feet (R26.81);Muscle weakness (generalized) (M62.81)   Activity Tolerance Patient tolerated treatment well   Patient Left in chair;with call bell/phone within reach;with chair alarm set   Nurse Communication Mobility status;Other (comment)        Time: 6440-3474 OT Time Calculation (min): 42 min  Charges: OT General Charges $OT Visit: 1 Visit OT Treatments $Self Care/Home Management : 23-37 mins $Therapeutic Activity:  23-37 mins  Secundino Ellithorpe H. OTR/L Supplemental OT, Department of rehab services [phone]   Demarkis Gheen R H. 07/18/2021, 8:59 AM

## 2021-07-18 NOTE — Progress Notes (Addendum)
Physical Therapy Treatment Patient Details Name: Christopher Santiago MRN: [id number] DOB: May 17, 1931 Today's Date: 07/18/2021    History of Present Illness Pt is an 87 y/o male admitted secondary to worsening SOB, especially with exertion. Found to have acute respiratory failure with hypoxia. Imaging showed fullness of the right aryepiglottic fold extending into the right pyriform and L renal mass. Pt had Flexible laryngoscopy on 8/10. Trach in place. Workup pending. PMH includes vocal cord cancer, COPD, CAD, DM, HTN, and R THA.    PT Comments    Pt making good progress with mobility and remains very motivated to return to independence. Pt's daughter now plans to stay with pt at DC. Feel pt is excellent CIR candidate. Pt tolerated incr activity with stable vitals and use of Passy Muir throughout.  Will consider trying rollator next visit.   Follow Up Recommendations  CIR     Equipment Recommendations  Other (comment) (possibly rollator)    Recommendations for Other Services       Precautions / Restrictions Precautions Precautions: Fall Precaution Comments: Orthostatic with OT earlier Restrictions Weight Bearing Restrictions: No    Mobility  Bed Mobility           General bed mobility comments: Pt up in chair    Transfers Overall transfer level: Needs assistance Equipment used: Rolling walker (2 wheeled) Transfers: Sit to/from Stand Sit to Stand: Min assist Stand pivot transfers: Min guard       General transfer comment: Assist to bring hips up from chair. Verbal cues for hand placement  Ambulation/Gait Ambulation/Gait assistance: Min assist Gait Distance (Feet): 150 Feet (x 2) Assistive device: Rolling walker (2 wheeled) Gait Pattern/deviations: Step-through pattern;Decreased stride length;Trunk flexed Gait velocity: dec Gait velocity interpretation: 1.31 - 2.62 ft/sec, indicative of limited community ambulator General Gait Details: Assist for balance and  support. Pt with tendency to be off center to the right in the walker. Verbal cues to keep feet closer to walker during turns   Stairs             Wheelchair Mobility    Modified Rankin (Stroke Patients Only)       Balance Overall balance assessment: Needs assistance Sitting-balance support: No upper extremity supported;Feet supported Sitting balance-Leahy Scale: Fair    Standing balance support: Bilateral upper extremity supported;During functional activity Standing balance-Leahy Scale: Poor Standing balance comment: walker and min guard for static standing                            Cognition Arousal/Alertness: Awake/alert Behavior During Therapy: WFL for tasks assessed/performed Overall Cognitive Status: Within Functional Limits for tasks assessed                                       Exercises      General Comments General comments (skin integrity, edema, etc.): Placed Passy Muir on pt for therapy. Pt on 28% trach collar at rest. Removed for ambulation. Pt with SpO2 >97% during ambulation. HR to 110's and RR to 25 with amb. Pt denied any lightheadedness.      Pertinent Vitals/Pain Pain Assessment: No/denies pain    Home Living                      Prior Function            PT Goals (current  goals can now be found in the care plan section) Acute Rehab PT Goals Patient Stated Goal: To go to CIR Progress towards PT goals: Progressing toward goals    Frequency    Min 3X/week      PT Plan Discharge plan needs to be updated;Frequency needs to be updated    Co-evaluation              AM-PAC PT "6 Clicks" Mobility   Outcome Measure  Help needed turning from your back to your side while in a flat bed without using bedrails?: A Little Help needed moving from lying on your back to sitting on the side of a flat bed without using bedrails?: A Little Help needed moving to and from a bed to a chair (including a  wheelchair)?: A Little Help needed standing up from a chair using your arms (e.g., wheelchair or bedside chair)?: A Little Help needed to walk in hospital room?: A Little Help needed climbing 3-5 steps with a railing? : A Lot 6 Click Score: 17    End of Session Equipment Utilized During Treatment: Gait belt Activity Tolerance: Patient tolerated treatment well Patient left: in chair;with call bell/phone within reach;with chair alarm set   PT Visit Diagnosis: Unsteadiness on feet (R26.81);Difficulty in walking, not elsewhere classified (R26.2)     Time: 9242-6834 PT Time Calculation (min) (ACUTE ONLY): 20 min  Charges:  $Gait Training: 8-22 mins                     Mitchell Pager [phone] Office [place] 07/18/2021, 10:36 AM

## 2021-07-18 NOTE — Progress Notes (Signed)
Nutrition Follow-up  DOCUMENTATION CODES:  Severe malnutrition in context of chronic illness  INTERVENTION:   Recommend initiation of bowel regimen as no BM documented since 8/17  Continue TF via PEG: -Osmolite 1.5 cal @ 55 ml/hr (1320 ml/day) -ProSource TF 45 ml BID -13m free water flushes Q4H   Provides 2060 kcal, 105 grams of protein, and 1006 ml of H2O (1906 ml free water total with flushes)  NUTRITION DIAGNOSIS:  Severe Malnutrition related to chronic illness (COPD) as evidenced by severe fat depletion, severe muscle depletion. - ongoing  GOAL:  Patient will meet greater than or equal to 90% of their needs - met with TF  MONITOR:  Diet advancement, Labs, Weight trends, Skin, I & O's  REASON FOR ASSESSMENT:  Consult Assessment of nutrition requirement/status  ASSESSMENT:  87year old male who presented to the ED on 8/09 with SOB and dysphagia. PMH of COPD, CAD, depression, DM, HTN, HLD, laryngeal cancer treated ~30 years ago, OSA.  8/10 - s/p laryngeal biopsy and tracheostomy 8/12 - Cortrak placed, tip of tube in stomach 8/15 - trach exchanged to #6 XLT-P uncuffed 8/20 - Cortrak pulled by patient 8/22 - Cortrak replaced (gastric tip) 8/23 - PEG placed; trach exchanged (still #6 XLT-P uncuffed)  Pt pending discharge to CIR. Remains NPO, SLP following. Per RN, pt tolerating TF via PEG. Current TF regimen: Osmolite 1.5 cal @ 55 ml/hr (1320 ml/day) w/ ProSource TF 45 ml BID and 1540mfree water flushes Q4H. This provides 2060 kcal, 105 grams of protein, and 1006 ml of H2O (1906 ml free water total with flushes).   UOP: 35066mocumented x24 hours I/O:+ 6.3L since admit  Recommend initiation of bowel regimen as no BM documented since 8/17  Admission weight: 70.8 kg Current weight: 69 kg  Medications: SSI, Senokot Labs: PO4 2.4 (L) CBGs: 78-130 over 24 hours  Diet Order:   Diet Order             Diet NPO time specified Except for: Ice Chips  Diet effective now                   EDUCATION NEEDS:  No education needs have been identified at this time  Skin:  Skin Assessment: Reviewed RN Assessment Skin Integrity Issues:: Incisions Incisions: neck s/p trach  Last BM:  8/17  Height:  Ht Readings from Last 1 Encounters:  07/02/21 5' 6"  (1.676 m)   Weight:  Wt Readings from Last 1 Encounters:  07/15/21 69 kg   BMI:  Body mass index is 24.55 kg/m.  Estimated Nutritional Needs:  Kcal:  2000-2200 kcal Protein:  95-115 grams Fluid:  >/= 2 L/day   AmaLarkin InaS, RD, LDN (she/her/hers) RD pager number and weekend/on-call pager number located in [place]

## 2021-07-18 NOTE — Progress Notes (Signed)
PROGRESS NOTE  Christopher Santiago AOZ:[id number] DOB: 11-14-1931 DOA: 07/01/2021 PCP: Tonia Ghent, MD   LOS: 17 days   Brief Narrative / Interim history:  87 year old with past medical history significant for COPD, quit smoking 30 years ago, vocal cord cancer, diabetes who presents complaining of exertional dyspnea, reports sinusitis, postnasal drip, hoarseness for the past 3 to 4 months. He was also complaining of difficulty swallowing, food gets stuck in the throat, he also report phlegm, in his throat. CT maxillofacial, soft tissue neck: Asymmetric soft tissue density/fullness at the base of the right aryepiglottic fold, extending into the right piriform sinus with possible involvement of the right glottis inferiorly.  If echo to measure or define a discrete mass within this region given lack of IV contrast. ENT consulted for direct visualization.  Patient underwent  laryngoscopy with biopsy and tracheostomy 8/10.  Currently getting PEG tube placement then will need disposition, cancer treatment with radiation therapy was offered following discharge.  Subjective / 24h Interval events:  Patient denies any complaints today, no significant events overnight as discussed with staff.  Assessment & Plan:  Acute Hypoxic Respiratory Failure, tracheostomy in place -Suspect related to component of aspiration due to dysphagia larynx CA, COPD exacerbation.   -He has completed 5 days of IV steroids  - completed  7 days of cefepime with last day 8/21 - Respiratory status has remained stable and wheezing has resolved -respiratory culture with normal respiratory flora -Continue Mucomyst 3 times daily -ENT managing trach care -Secretions overall improving, continue with scopolamine patch  Squamous cell carcinoma Larynx-CT: With asymmetric soft tissue density fullness at the base of the right aryepiglottic fold: ENT consulted.  Patient underwent laryngoscopy with biopsy and tracheostomy 8/11. Stable on  ATC 5 L oxygen.  -Biopsy showed squamous cell carcinoma with foci suspicious for invasion.  -He was discussed at tumor board, will have outpatient follow-up with medical oncology, radiation oncology.  CT scan of the neck and chest showing that the pulmonary nodules have not progressed since 2019 therefore benign -Trach changed to Shiley 6 Prox XLT Cuffless.  Management per ENT  Nutrition-getting PEG tube placement today  Acute metabolic encephalopathy- intermittent, in the setting of acute illness/hospitalization.  Ativan as needed, hold if too sedated  1.2 cm spiculated right upper lobe nodule-outpatient follow up   Left Renal mass -Concerning for renal carcinoma, will need to follow-up with urology as an outpatient   Diabetes mellitus - Not on medication. Continue SSI  HTN -Resume metoprolol, Continue with Norvasc.    Leukocytosis probably related to steroids vs infection.  Resolved    Scheduled Meds:  acetaminophen  500 mg Per Tube TID   allopurinol  100 mg Oral Daily   amLODipine  5 mg Oral Daily   arformoterol  15 mcg Nebulization BID   budesonide (PULMICORT) nebulizer solution  0.25 mg Nebulization BID   chlorhexidine gluconate (MEDLINE KIT)  15 mL Mouth Rinse BID   Chlorhexidine Gluconate Cloth  6 each Topical Daily   feeding supplement (PROSource TF)  45 mL Per Tube BID   free water  150 mL Per Tube Q4H   guaiFENesin  10 mL Oral Q8H   insulin aspart  0-5 Units Subcutaneous QHS   insulin aspart  0-9 Units Subcutaneous TID WC   lidocaine  1 application Urethral Once   mouth rinse  15 mL Mouth Rinse 10 times per day   nystatin  5 mL Oral QID   revefenacin  175 mcg Nebulization Daily  scopolamine  1 patch Transdermal Q72H   sennosides  5 mL Oral BID   sodium chloride flush  10-40 mL Intracatheter Q12H   Continuous Infusions:  dextrose 10 % and 0.45 % NaCl 50 mL/hr at 07/17/21 0932   feeding supplement (OSMOLITE 1.5 CAL) 1,000 mL (07/17/21 1609)   PRN Meds:.albuterol,  bisacodyl, hydrALAZINE, lip balm, LORazepam, sodium chloride flush, traMADol  Diet Orders (From admission, onward)     Start     Ordered   07/03/21 1433  Diet NPO time specified Except for: Ice Chips  Diet effective now       Comments: Occasional ice chips after oral care  Question:  Except for  Answer:  Ice Chips   07/03/21 1432            DVT prophylaxis: Place and maintain sequential compression device Start: 07/04/21 1027     Code Status: DNR  Family Communication: None at bedside  Status is: Inpatient  Remains inpatient appropriate because:IV treatments appropriate due to intensity of illness or inability to take PO  Dispo: The patient is from: Home              Anticipated d/c is to: SNF vs CIR              Patient currently is not medically stable to d/c.   Difficult to place patient No  Level of care: Progressive  Consultants:  ENT Trauma surgery for PEG Palliaitve  PCCM  Procedures:  Laryngoscopy / trach 2d echo IMPRESSIONS   1. Technically difficult study, very poor visualization of left ventricle even after contrast administration. Left ventricular ejection fraction, by estimation, is 55 to 60%. The left ventricle has grossly normal function but very poorly visualized. Left ventricular endocardial border not optimally defined to evaluate regional wall motion. Left ventricular diastolic parameters are indeterminate.   2. Right ventricular systolic function is normal. The right ventricular size is mildly enlarged. Tricuspid regurgitation signal is inadequate for assessing PA pressure.   3. Right atrial size was moderately dilated.   4. A small pericardial effusion is present. The pericardial effusion is anterior to the right ventricle. Presence of pericardial fat pad.   5. The mitral valve is grossly normal. No evidence of mitral valve regurgitation.   6. The aortic valve was not well visualized. Aortic valve regurgitation is not visualized. No aortic  stenosis is present.   7. The inferior vena cava is normal in size with greater than 50% respiratory variability, suggesting right atrial pressure of 3 mmHg.   Microbiology  none  Antimicrobials: None   Objective: Vitals:   07/18/21 0739 07/18/21 0743 07/18/21 1105 07/18/21 1150  BP:  (!) 149/71 119/60 (!) 123/43  Pulse: 68 67 93 81  Resp: _0 Temp:  98.4 F (36.9 C)  98.3 F (36.8 C)  TempSrc:  Oral  Oral  SpO2: 99% 99%  100%  Weight:      Height:        Intake/Output Summary (Last 24 hours) at 07/18/2021 1428 Last data filed at 07/18/2021 0552 Gross per 24 hour  Intake 343.25 ml  Output 350 ml  Net -6.75 ml   Filed Weights   07/08/21 0505 07/11/21 0506 07/15/21 0610  Weight: 71.8 kg 70.6 kg 69 kg    Examination:  Awake Alert, awake, alert, pleasant, deconditioned Symmetrical Chest wall movement, trach with PMV today, no wheezing RRR,No Gallops,Rubs or new Murmurs, No Parasternal Heave +ve B.Sounds, Abd Soft, No  tenderness, No rebound - guarding or rigidity. No Cyanosis, Clubbing or edema, No new Rash or bruise     Data Reviewed: I have independently reviewed following labs and imaging studies   CBC: Recent Labs  Lab 07/12/21 0124 07/13/21 0321 07/16/21 0331 07/17/21 0321 07/18/21 0333  WBC 9.4 10.1 10.9* 9.9 9.0  HGB 10.6* 10.8* 10.5* 10.5* 10.3*  HCT 32.4* 33.8* 32.1* 33.1* 31.7*  MCV 93.1 94.4 94.1 96.5 94.1  PLT 157 185 194 184 569   Basic Metabolic Panel: Recent Labs  Lab 07/12/21 0124 07/13/21 0321 07/16/21 0331 07/17/21 0321 07/18/21 0333  NA 131* 136 139 138 136  K 4.7 4.4 4.0 4.0 3.5  CL 100 103 107 107 104  CO2 _0 GLUCOSE 141* 103* 123* 150* 123*  BUN 26* _1 CREATININE 0.83 0.85 0.85 0.77 0.65  CALCIUM 8.8* 9.0 9.0 9.2 8.9  PHOS  --   --   --  3.5 2.4*   Liver Function Tests: Recent Labs  Lab 07/16/21 0331  AST 14*  ALT 12  ALKPHOS 42  BILITOT 0.9  PROT 5.7*  ALBUMIN 2.2*   Coagulation  Profile: No results for input(s): INR, PROTIME in the last 168 hours. HbA1C: No results for input(s): HGBA1C in the last 72 hours. CBG: Recent Labs  Lab 07/17/21 1941 07/17/21 2334 07/18/21 0357 07/18/21 0744 07/18/21 1150  GLUCAP 95 108* 78 123* 136*    No results found for this or any previous visit (from the past 240 hour(s)).     Radiology Studies: No results found.   Phillips Climes MD [facility]  Between 7 am - 7 pm I am available, please contact me via Amion (for emergencies) or Securechat (non urgent messages)  Between 7 pm - 7 am I am not available, please contact night coverage MD/APP via Amion

## 2021-07-18 NOTE — Progress Notes (Signed)
Palliative-   Patient is doing well, progressing and being worked up for [employer].  Palliative consult completed on 8/19, MOST form also completed and placed on patient's chart.  Goals are clear.  Palliative will sign off.  Please feel free to re consult if further Palliative assistance is needed.   Mariana Kaufman, AGNP-C Palliative Medicine  No charge

## 2021-07-18 NOTE — Progress Notes (Signed)
Inpatient Rehab Admissions Coordinator:   Faxed updated clinicals with CIR recs from PT/OT to [facility].  Awaiting determination regarding CIR prior auth request.  Will continue to follow.   Shann Medal, PT, DPT Admissions Coordinator [phone] 07/18/21  12:14 PM

## 2021-07-19 DIAGNOSIS — Z930 Tracheostomy status: Secondary | ICD-10-CM | POA: Diagnosis not present

## 2021-07-19 DIAGNOSIS — N2889 Other specified disorders of kidney and ureter: Secondary | ICD-10-CM | POA: Diagnosis not present

## 2021-07-19 DIAGNOSIS — J9601 Acute respiratory failure with hypoxia: Secondary | ICD-10-CM | POA: Diagnosis not present

## 2021-07-19 LAB — BASIC METABOLIC PANEL
Anion gap: 6 (ref 5–15)
BUN: 18 mg/dL (ref 8–23)
CO2: 25 mmol/L (ref 22–32)
Calcium: 9.1 mg/dL (ref 8.9–10.3)
Chloride: 107 mmol/L (ref 98–111)
Creatinine, Ser: 0.63 mg/dL (ref 0.61–1.24)
GFR, Estimated: >60 mL/min (ref >60)
Glucose, Bld: 174 mg/dL — ABNORMAL HIGH (ref 70–99)
Potassium: 3.9 mmol/L (ref 3.5–5.1)
Sodium: 138 mmol/L (ref 135–145)

## 2021-07-19 LAB — CBC
HCT: 31.2 % — ABNORMAL LOW (ref 39.0–52.0)
Hemoglobin: 9.9 g/dL — ABNORMAL LOW (ref 13.0–17.0)
MCH: 30.1 pg (ref 26.0–34.0)
MCHC: 31.7 g/dL (ref 30.0–36.0)
MCV: 94.8 fL (ref 80.0–100.0)
Platelets: 179 10*3/uL (ref 150–400)
RBC: 3.29 MIL/uL — ABNORMAL LOW (ref 4.22–5.81)
RDW: 13.3 % (ref 11.5–15.5)
WBC: 8.8 10*3/uL (ref 4.0–10.5)
nRBC: 0 % (ref 0.0–0.2)

## 2021-07-19 LAB — GLUCOSE, CAPILLARY
Glucose-Capillary: 139 mg/dL — ABNORMAL HIGH (ref 70–99)
Glucose-Capillary: 122 mg/dL — ABNORMAL HIGH (ref 70–99)
Glucose-Capillary: 101 mg/dL — ABNORMAL HIGH (ref 70–99)
Glucose-Capillary: 125 mg/dL — ABNORMAL HIGH (ref 70–99)
Glucose-Capillary: 184 mg/dL — ABNORMAL HIGH (ref 70–99)

## 2021-07-19 LAB — PHOSPHORUS: Phosphorus: 3 mg/dL (ref 2.5–4.6)

## 2021-07-19 NOTE — Progress Notes (Signed)
PROGRESS NOTE  Christopher Santiago:[id number] DOB: 20-Jun-1931 DOA: 07/01/2021 PCP: Tonia Ghent, MD   LOS: 18 days   Brief Narrative / Interim history:  87 year old with past medical history significant for COPD, quit smoking 30 years ago, vocal cord cancer, diabetes who presents complaining of exertional dyspnea, reports sinusitis, postnasal drip, hoarseness for the past 3 to 4 months. He was also complaining of difficulty swallowing, food gets stuck in the throat, he also report phlegm, in his throat. CT maxillofacial, soft tissue neck: Asymmetric soft tissue density/fullness at the base of the right aryepiglottic fold, extending into the right piriform sinus with possible involvement of the right glottis inferiorly.  If echo to measure or define a discrete mass within this region given lack of IV contrast. ENT consulted for direct visualization.  Patient underwent  laryngoscopy with biopsy and tracheostomy 8/10.  received  PEG tube , tolerating tube feeds, cancer treatment with radiation therapy was offered following discharge.  Subjective / 24h Interval events:  Events overnight as discussed with staff, patient himself denies any complaints.  Assessment & Plan:  Acute Hypoxic Respiratory Failure, tracheostomy in place -Suspect related to component of aspiration due to dysphagia larynx CA, COPD exacerbation.   -He has completed 5 days of IV steroids  - completed  7 days of cefepime with last day 8/21 - Respiratory status has remained stable and wheezing has resolved -respiratory culture with normal respiratory flora -Continue Mucomyst 3 times daily -ENT managing trach care -Secretions overall improving, continue with scopolamine patch  Squamous cell carcinoma Larynx -CT: With asymmetric soft tissue density fullness at the base of the right aryepiglottic fold: ENT consulted.  Patient underwent laryngoscopy with biopsy and tracheostomy 8/11. Stable on ATC 5 L oxygen.  -Biopsy showed  squamous cell carcinoma with foci suspicious for invasion.  -He was discussed at tumor board, will have outpatient follow-up with medical oncology, radiation oncology.  CT scan of the neck and chest showing that the pulmonary nodules have not progressed since 2019 therefore benign -Trach changed to Shiley 6 Prox XLT Cuffless.  Management per ENT  Nutrition-getting PEG tube placement today  Acute metabolic encephalopathy- intermittent, in the setting of acute illness/hospitalization.  Mentation significantly improved  1.2 cm spiculated right upper lobe nodule-outpatient follow up   Left Renal mass -Concerning for renal carcinoma, will need to follow-up with urology as an outpatient, I have discussed with daughter, she understands about the importance of follow-up.   Diabetes mellitus - Not on medication. Continue SSI  HTN -Resume metoprolol, Continue with Norvasc.    Leukocytosis probably related to steroids vs infection.  Resolved    Scheduled Meds:  acetaminophen  500 mg Per Tube TID   allopurinol  100 mg Oral Daily   amLODipine  5 mg Oral Daily   arformoterol  15 mcg Nebulization BID   budesonide (PULMICORT) nebulizer solution  0.25 mg Nebulization BID   chlorhexidine gluconate (MEDLINE KIT)  15 mL Mouth Rinse BID   Chlorhexidine Gluconate Cloth  6 each Topical Daily   feeding supplement (PROSource TF)  45 mL Per Tube BID   free water  150 mL Per Tube Q4H   guaiFENesin  10 mL Oral Q8H   insulin aspart  0-5 Units Subcutaneous QHS   insulin aspart  0-9 Units Subcutaneous TID WC   lidocaine  1 application Urethral Once   mouth rinse  15 mL Mouth Rinse 10 times per day   nystatin  5 mL Oral QID  revefenacin  175 mcg Nebulization Daily   scopolamine  1 patch Transdermal Q72H   sennosides  5 mL Oral BID   sodium chloride flush  10-40 mL Intracatheter Q12H   Continuous Infusions:  dextrose 10 % and 0.45 % NaCl 50 mL/hr at 07/19/21 0351   feeding supplement (OSMOLITE 1.5 CAL)  1,000 mL (07/17/21 1609)   PRN Meds:.albuterol, bisacodyl, hydrALAZINE, lip balm, LORazepam, sodium chloride flush, traMADol  Diet Orders (From admission, onward)     Start     Ordered   07/03/21 1433  Diet NPO time specified Except for: Ice Chips  Diet effective now       Comments: Occasional ice chips after oral care  Question:  Except for  Answer:  Ice Chips   07/03/21 1432            DVT prophylaxis: Place and maintain sequential compression device Start: 07/04/21 1027     Code Status: DNR  Family Communication: d/w daughter by phone  Status is: Inpatient  Remains inpatient appropriate because:IV treatments appropriate due to intensity of illness or inability to take PO  Dispo: The patient is from: Home              Anticipated d/c is to: SNF vs CIR              Patient currently is not medically stable to d/c.   Difficult to place patient No  Level of care: Progressive  Consultants:  ENT Trauma surgery for PEG Palliaitve  PCCM  Procedures:  Laryngoscopy / trach 2d echo IMPRESSIONS   1. Technically difficult study, very poor visualization of left ventricle even after contrast administration. Left ventricular ejection fraction, by estimation, is 55 to 60%. The left ventricle has grossly normal function but very poorly visualized. Left ventricular endocardial border not optimally defined to evaluate regional wall motion. Left ventricular diastolic parameters are indeterminate.   2. Right ventricular systolic function is normal. The right ventricular size is mildly enlarged. Tricuspid regurgitation signal is inadequate for assessing PA pressure.   3. Right atrial size was moderately dilated.   4. A small pericardial effusion is present. The pericardial effusion is anterior to the right ventricle. Presence of pericardial fat pad.   5. The mitral valve is grossly normal. No evidence of mitral valve regurgitation.   6. The aortic valve was not well visualized. Aortic  valve regurgitation is not visualized. No aortic stenosis is present.   7. The inferior vena cava is normal in size with greater than 50% respiratory variability, suggesting right atrial pressure of 3 mmHg.   Microbiology  none  Antimicrobials: None   Objective: Vitals:   07/19/21 0816 07/19/21 0818 07/19/21 1137 07/19/21 1144  BP:    (!) 127/54  Pulse:  82 75   Resp:  18 (!) 24   Temp:      TempSrc:      SpO2: 99% 99% 99%   Weight:      Height:        Intake/Output Summary (Last 24 hours) at 07/19/2021 1308 Last data filed at 07/19/2021 0721 Gross per 24 hour  Intake --  Output 1103 ml  Net -1103 ml   Filed Weights   07/08/21 0505 07/11/21 0506 07/15/21 0610  Weight: 71.8 kg 70.6 kg 69 kg    Examination:  Awake Alert, Oriented X 3, pleasant, appropriate, no apparent distress, trach present  symmetrical Chest wall movement, Good air movement bilaterally, CTAB RRR,No Gallops,Rubs or new Murmurs,  No Parasternal Heave +ve B.Sounds, Abd Soft, No tenderness, No rebound - guarding or rigidity. No Cyanosis, Clubbing or edema, No new Rash or bruise     Data Reviewed: I have independently reviewed following labs and imaging studies   CBC: Recent Labs  Lab 07/13/21 0321 07/16/21 0331 07/17/21 0321 07/18/21 0333 07/19/21 0221  WBC 10.1 10.9* 9.9 9.0 8.8  HGB 10.8* 10.5* 10.5* 10.3* 9.9*  HCT 33.8* 32.1* 33.1* 31.7* 31.2*  MCV 94.4 94.1 96.5 94.1 94.8  PLT 185 194 184 175 800   Basic Metabolic Panel: Recent Labs  Lab 07/13/21 0321 07/16/21 0331 07/17/21 0321 07/18/21 0333 07/19/21 0221  NA 136 139 138 136 138  K 4.4 4.0 4.0 3.5 3.9  CL 103 107 107 104 107  CO2 27 27 25 24 25   GLUCOSE 103* 123* 150* 123* 174*  BUN 20 14 18 15 18   CREATININE 0.85 0.85 0.77 0.65 0.63  CALCIUM 9.0 9.0 9.2 8.9 9.1  PHOS  --   --  3.5 2.4* 3.0   Liver Function Tests: Recent Labs  Lab 07/16/21 0331  AST 14*  ALT 12  ALKPHOS 42  BILITOT 0.9  PROT 5.7*  ALBUMIN 2.2*    Coagulation Profile: No results for input(s): INR, PROTIME in the last 168 hours. HbA1C: No results for input(s): HGBA1C in the last 72 hours. CBG: Recent Labs  Lab 07/18/21 2100 07/18/21 2352 07/19/21 0536 07/19/21 0749 07/19/21 1143  GLUCAP 114* 142* 139* 122* 101*    No results found for this or any previous visit (from the past 240 hour(s)).     Radiology Studies: No results found.   Phillips Climes MD [facility]  Between 7 am - 7 pm I am available, please contact me via Amion (for emergencies) or Securechat (non urgent messages)  Between 7 pm - 7 am I am not available, please contact night coverage MD/APP via Amion

## 2021-07-20 DIAGNOSIS — J9601 Acute respiratory failure with hypoxia: Secondary | ICD-10-CM | POA: Diagnosis not present

## 2021-07-20 DIAGNOSIS — F0390 Unspecified dementia without behavioral disturbance: Secondary | ICD-10-CM | POA: Diagnosis not present

## 2021-07-20 LAB — GLUCOSE, CAPILLARY
Glucose-Capillary: 115 mg/dL — ABNORMAL HIGH (ref 70–99)
Glucose-Capillary: 125 mg/dL — ABNORMAL HIGH (ref 70–99)
Glucose-Capillary: 145 mg/dL — ABNORMAL HIGH (ref 70–99)
Glucose-Capillary: 147 mg/dL — ABNORMAL HIGH (ref 70–99)
Glucose-Capillary: 151 mg/dL — ABNORMAL HIGH (ref 70–99)
Glucose-Capillary: 160 mg/dL — ABNORMAL HIGH (ref 70–99)
Glucose-Capillary: 232 mg/dL — ABNORMAL HIGH (ref 70–99)

## 2021-07-20 NOTE — Progress Notes (Signed)
PROGRESS NOTE  Christopher Santiago UUV:[id number] DOB: 1931/09/11 DOA: 07/01/2021 PCP: Tonia Ghent, MD   LOS: 19 days   Brief Narrative / Interim history:  87 year old with past medical history significant for COPD, quit smoking 30 years ago, vocal cord cancer, diabetes who presents complaining of exertional dyspnea, reports sinusitis, postnasal drip, hoarseness for the past 3 to 4 months. He was also complaining of difficulty swallowing, food gets stuck in the throat, he also report phlegm, in his throat. CT maxillofacial, soft tissue neck: Asymmetric soft tissue density/fullness at the base of the right aryepiglottic fold, extending into the right piriform sinus with possible involvement of the right glottis inferiorly.  If echo to measure or define a discrete mass within this region given lack of IV contrast. ENT consulted for direct visualization.  Patient underwent  laryngoscopy with biopsy and tracheostomy 8/10.  received  PEG tube , tolerating tube feeds, cancer treatment with radiation therapy was offered following discharge.  Subjective / 24h Interval events:  No significant events overnight, patient denies any complaints.  Assessment & Plan:  Acute Hypoxic Respiratory Failure, tracheostomy in place -Suspect related to component of aspiration due to dysphagia larynx CA, COPD exacerbation.   -He has completed 5 days of IV steroids  - completed  7 days of cefepime with last day 8/21 - Respiratory status has remained stable and wheezing has resolved -respiratory culture with normal respiratory flora -Continue Mucomyst 3 times daily -ENT managing trach care -It is improving, able to tolerate PMV more frequently, secretion is improving as well on scopolamine patch.  Squamous cell carcinoma Larynx -CT: With asymmetric soft tissue density fullness at the base of the right aryepiglottic fold: ENT consulted.  Patient underwent laryngoscopy with biopsy and tracheostomy 8/11. Stable on ATC 5  L oxygen.  -Biopsy showed squamous cell carcinoma with foci suspicious for invasion.  -He was discussed at tumor board, will have outpatient follow-up with medical oncology, radiation oncology.  CT scan of the neck and chest showing that the pulmonary nodules have not progressed since 2019 therefore benign -Trach changed to Shiley 6 Prox XLT Cuffless.  Management per ENT  Nutrition-getting PEG tube placement today  Acute metabolic encephalopathy -Mentation significantly improved, he is awake alert and appropriate right now, he was with significant delirium initially.  1.2 cm spiculated right upper lobe nodule-outpatient follow up   Left Renal mass -Concerning for renal carcinoma, will need to follow-up with urology as an outpatient, I have discussed with daughter, she understands about the importance of follow-up.   Diabetes mellitus - Not on medication. Continue SSI  HTN -Resume metoprolol, Continue with Norvasc.    Leukocytosis probably related to steroids vs infection.  Resolved    Scheduled Meds:  acetaminophen  500 mg Per Tube TID   allopurinol  100 mg Oral Daily   amLODipine  5 mg Oral Daily   arformoterol  15 mcg Nebulization BID   budesonide (PULMICORT) nebulizer solution  0.25 mg Nebulization BID   chlorhexidine gluconate (MEDLINE KIT)  15 mL Mouth Rinse BID   Chlorhexidine Gluconate Cloth  6 each Topical Daily   feeding supplement (PROSource TF)  45 mL Per Tube BID   free water  150 mL Per Tube Q4H   guaiFENesin  10 mL Oral Q8H   insulin aspart  0-5 Units Subcutaneous QHS   insulin aspart  0-9 Units Subcutaneous TID WC   lidocaine  1 application Urethral Once   mouth rinse  15 mL Mouth Rinse 10  times per day   nystatin  5 mL Oral QID   revefenacin  175 mcg Nebulization Daily   scopolamine  1 patch Transdermal Q72H   sennosides  5 mL Oral BID   sodium chloride flush  10-40 mL Intracatheter Q12H   Continuous Infusions:  dextrose 10 % and 0.45 % NaCl 50 mL/hr at  07/19/21 0351   feeding supplement (OSMOLITE 1.5 CAL) 1,000 mL (07/20/21 1033)   PRN Meds:.albuterol, bisacodyl, hydrALAZINE, lip balm, LORazepam, sodium chloride flush, traMADol  Diet Orders (From admission, onward)     Start     Ordered   07/03/21 1433  Diet NPO time specified Except for: Ice Chips  Diet effective now       Comments: Occasional ice chips after oral care  Question:  Except for  Answer:  Ice Chips   07/03/21 1432            DVT prophylaxis: Place and maintain sequential compression device Start: 07/04/21 1027     Code Status: DNR  Family Communication: d/w daughter by phone 8/27  Status is: Inpatient  Remains inpatient appropriate because:IV treatments appropriate due to intensity of illness or inability to take PO  Dispo: The patient is from: Home              Anticipated d/c is to: SNF vs CIR              Patient currently is not medically stable to d/c.   Difficult to place patient No  Level of care: Progressive  Consultants:  ENT Trauma surgery for PEG Palliaitve  PCCM  Procedures:  Laryngoscopy / trach 2d echo IMPRESSIONS   1. Technically difficult study, very poor visualization of left ventricle even after contrast administration. Left ventricular ejection fraction, by estimation, is 55 to 60%. The left ventricle has grossly normal function but very poorly visualized. Left ventricular endocardial border not optimally defined to evaluate regional wall motion. Left ventricular diastolic parameters are indeterminate.   2. Right ventricular systolic function is normal. The right ventricular size is mildly enlarged. Tricuspid regurgitation signal is inadequate for assessing PA pressure.   3. Right atrial size was moderately dilated.   4. A small pericardial effusion is present. The pericardial effusion is anterior to the right ventricle. Presence of pericardial fat pad.   5. The mitral valve is grossly normal. No evidence of mitral valve  regurgitation.   6. The aortic valve was not well visualized. Aortic valve regurgitation is not visualized. No aortic stenosis is present.   7. The inferior vena cava is normal in size with greater than 50% respiratory variability, suggesting right atrial pressure of 3 mmHg.   Microbiology  none  Antimicrobials: None   Objective: Vitals:   07/20/21 0833 07/20/21 0836 07/20/21 1130 07/20/21 1224  BP:    (!) 135/47  Pulse:  71 67 67  Resp:  _0 Temp:      TempSrc:      SpO2: 100% 100% 97% 99%  Weight:      Height:        Intake/Output Summary (Last 24 hours) at 07/20/2021 1446 Last data filed at 07/20/2021 1205 Gross per 24 hour  Intake 10 ml  Output 2075 ml  Net -2065 ml   Filed Weights   07/08/21 0505 07/11/21 0506 07/15/21 0610  Weight: 71.8 kg 70.6 kg 69 kg    Examination:  Awake Alert, Oriented X 3, No new F.N deficits, Normal affect, frail, deconditioned  Symmetrical Chest wall movement, Good air movement bilaterally, CTAB RRR,No Gallops,Rubs or new Murmurs, No Parasternal Heave +ve B.Sounds, Abd Soft, No tenderness, No rebound - guarding or rigidity. No Cyanosis, Clubbing or edema, No new Rash or bruise      Data Reviewed: I have independently reviewed following labs and imaging studies   CBC: Recent Labs  Lab 07/16/21 0331 07/17/21 0321 07/18/21 0333 07/19/21 0221  WBC 10.9* 9.9 9.0 8.8  HGB 10.5* 10.5* 10.3* 9.9*  HCT 32.1* 33.1* 31.7* 31.2*  MCV 94.1 96.5 94.1 94.8  PLT 194 184 175 746   Basic Metabolic Panel: Recent Labs  Lab 07/16/21 0331 07/17/21 0321 07/18/21 0333 07/19/21 0221  NA 139 138 136 138  K 4.0 4.0 3.5 3.9  CL 107 107 104 107  CO2 _0 GLUCOSE 123* 150* 123* 174*  BUN _1 CREATININE 0.85 0.77 0.65 0.63  CALCIUM 9.0 9.2 8.9 9.1  PHOS  --  3.5 2.4* 3.0   Liver Function Tests: Recent Labs  Lab 07/16/21 0331  AST 14*  ALT 12  ALKPHOS 42  BILITOT 0.9  PROT 5.7*  ALBUMIN 2.2*   Coagulation  Profile: No results for input(s): INR, PROTIME in the last 168 hours. HbA1C: No results for input(s): HGBA1C in the last 72 hours. CBG: Recent Labs  Lab 07/19/21 2155 07/20/21 0046 07/20/21 0436 07/20/21 0801 07/20/21 1211  GLUCAP 184* 145* 232* 151* 115*    No results found for this or any previous visit (from the past 240 hour(s)).     Radiology Studies: No results found.   Phillips Climes MD [facility]  Between 7 am - 7 pm I am available, please contact me via Amion (for emergencies) or Securechat (non urgent messages)  Between 7 pm - 7 am I am not available, please contact night coverage MD/APP via Amion

## 2021-07-20 NOTE — Plan of Care (Signed)
  Problem: Education: Goal: Knowledge of General Education information will improve Description: Including pain rating scale, medication(s)/side effects and non-pharmacologic comfort measures Outcome: Progressing   Problem: Health Behavior/Discharge Planning: Goal: Ability to manage health-related needs will improve Outcome: Progressing   Problem: Clinical Measurements: Goal: Ability to maintain clinical measurements within normal limits will improve Outcome: Progressing Goal: Will remain free from infection Outcome: Progressing   Problem: Activity: Goal: Risk for activity intolerance will decrease Outcome: Progressing   Problem: Coping: Goal: Level of anxiety will decrease Outcome: Progressing

## 2021-07-21 ENCOUNTER — Other Ambulatory Visit: Payer: Self-pay

## 2021-07-21 DIAGNOSIS — N2889 Other specified disorders of kidney and ureter: Secondary | ICD-10-CM | POA: Diagnosis not present

## 2021-07-21 DIAGNOSIS — J9601 Acute respiratory failure with hypoxia: Secondary | ICD-10-CM | POA: Diagnosis not present

## 2021-07-21 DIAGNOSIS — C321 Malignant neoplasm of supraglottis: Secondary | ICD-10-CM

## 2021-07-21 DIAGNOSIS — Z930 Tracheostomy status: Secondary | ICD-10-CM | POA: Diagnosis not present

## 2021-07-21 LAB — GLUCOSE, CAPILLARY
Glucose-Capillary: 133 mg/dL — ABNORMAL HIGH (ref 70–99)
Glucose-Capillary: 139 mg/dL — ABNORMAL HIGH (ref 70–99)
Glucose-Capillary: 143 mg/dL — ABNORMAL HIGH (ref 70–99)
Glucose-Capillary: 151 mg/dL — ABNORMAL HIGH (ref 70–99)
Glucose-Capillary: 154 mg/dL — ABNORMAL HIGH (ref 70–99)
Glucose-Capillary: 162 mg/dL — ABNORMAL HIGH (ref 70–99)
Glucose-Capillary: 263 mg/dL — ABNORMAL HIGH (ref 70–99)
Glucose-Capillary: 315 mg/dL — ABNORMAL HIGH (ref 70–99)
Glucose-Capillary: 46 mg/dL — ABNORMAL LOW (ref 70–99)
Glucose-Capillary: 63 mg/dL — ABNORMAL LOW (ref 70–99)

## 2021-07-21 MED ORDER — DEXTROSE 50 % IV SOLN
1.0000 | Freq: Once | INTRAVENOUS | Status: AC
  Administered 2021-07-21: 50 mL via INTRAVENOUS
  Filled 2021-07-21: qty 50

## 2021-07-21 NOTE — Progress Notes (Signed)
Physical Therapy Treatment Patient Details Name: Christopher Santiago MRN: [id number] DOB: December 18, 1930 Today's Date: 07/21/2021    History of Present Illness Pt is an 87 y/o male admitted secondary to worsening SOB, especially with exertion. Found to have acute respiratory failure with hypoxia. Imaging showed fullness of the right aryepiglottic fold extending into the right pyriform and L renal mass. Pt had Flexible laryngoscopy on 8/10. Trach in place. Workup pending. PMH includes vocal cord cancer, COPD, CAD, DM, HTN, and R THA.    PT Comments    Pt progressing well towards all goals. Pt with improved transfers ability and ambulation endurance. VSS, SpO2 >93% on 30% O2. Pt to benefit from aggressive therapy program to achieve safe mod I level of function in which pt demonstrates excellent rehab potential. Acute PT to cont to follow.    Follow Up Recommendations  CIR     Equipment Recommendations  Rolling walker with 5" wheels    Recommendations for Other Services Rehab consult     Precautions / Restrictions Precautions Precautions: Fall Restrictions Weight Bearing Restrictions: No    Mobility  Bed Mobility Overal bed mobility: Needs Assistance Bed Mobility: Supine to Sit     Supine to sit: Min assist     General bed mobility comments: HOB elevated, increased time, minA for trunk elevation and line management, used bed rail, labored effort    Transfers Overall transfer level: Needs assistance Equipment used: Rolling walker (2 wheeled) Transfers: Sit to/from Stand Sit to Stand: Min assist         General transfer comment: minA to power up and to steady during transition of hands, pt was able to scoot self closer to EOB for optimal position without assist today  Ambulation/Gait Ambulation/Gait assistance: Min assist Gait Distance (Feet): 300 Feet Assistive device: Rolling walker (2 wheeled) Gait Pattern/deviations: Step-through pattern;Decreased stride length;Trunk  flexed Gait velocity: dec Gait velocity interpretation: 1.31 - 2.62 ft/sec, indicative of limited community ambulator General Gait Details: minA for walker management around obstacles and during turns. SpO2 >93% on 30% O2 via trach   Stairs             Wheelchair Mobility    Modified Rankin (Stroke Patients Only)       Balance Overall balance assessment: Needs assistance Sitting-balance support: No upper extremity supported;Feet supported Sitting balance-Leahy Scale: Fair Sitting balance - Comments: Patient sat at EOB for 5-8 min with supervision A.   Standing balance support: Bilateral upper extremity supported;During functional activity Standing balance-Leahy Scale: Poor Standing balance comment: walker and min guard for static standing                            Cognition Arousal/Alertness: Awake/alert Behavior During Therapy: WFL for tasks assessed/performed Overall Cognitive Status: Within Functional Limits for tasks assessed                                 General Comments: pt very pleasant and eager to work      Exercises General Exercises - Lower Extremity Ankle Circles/Pumps: AROM;Both;10 reps;Supine Long Arc Quad: AROM;Both;10 reps;Seated (with  3 sec hold) Hip Flexion/Marching: AROM;Both;10 reps;Seated    General Comments General comments (skin integrity, edema, etc.): pt able to talk with passy muir valve today, VSS      Pertinent Vitals/Pain Pain Assessment: No/denies pain    Home Living  Prior Function            PT Goals (current goals can now be found in the care plan section) Progress towards PT goals: Progressing toward goals    Frequency    Min 3X/week      PT Plan Discharge plan needs to be updated;Frequency needs to be updated    Co-evaluation              AM-PAC PT "6 Clicks" Mobility   Outcome Measure  Help needed turning from your back to your side while in  a flat bed without using bedrails?: A Little Help needed moving from lying on your back to sitting on the side of a flat bed without using bedrails?: A Little Help needed moving to and from a bed to a chair (including a wheelchair)?: A Little Help needed standing up from a chair using your arms (e.g., wheelchair or bedside chair)?: A Little Help needed to walk in hospital room?: A Little Help needed climbing 3-5 steps with a railing? : A Lot 6 Click Score: 17    End of Session Equipment Utilized During Treatment: Gait belt Activity Tolerance: Patient tolerated treatment well Patient left: in chair;with call bell/phone within reach;with chair alarm set Nurse Communication: Mobility status PT Visit Diagnosis: Unsteadiness on feet (R26.81);Difficulty in walking, not elsewhere classified (R26.2)     Time: 1610-9604 PT Time Calculation (min) (ACUTE ONLY): 33 min  Charges:  $Gait Training: 8-22 mins $Therapeutic Exercise: 8-22 mins                     Kittie Plater, PT, DPT Acute Rehabilitation Services Pager #: [phone] Office #: [phone]    Berline Lopes 07/21/2021, 1:07 PM

## 2021-07-21 NOTE — Plan of Care (Signed)
  Problem: Safety: Goal: Ability to remain free from injury will improve Outcome: Progressing   Problem: Skin Integrity: Goal: Risk for impaired skin integrity will decrease Outcome: Progressing   Problem: Education: Goal: Knowledge about tracheostomy care/management will improve Outcome: Progressing   Problem: Activity: Goal: Ability to tolerate increased activity will improve Outcome: Progressing   Problem: Safety: Goal: Non-violent Restraint(s) Outcome: Progressing

## 2021-07-21 NOTE — Progress Notes (Addendum)
Per chat w/ Dr Fredric Dine, okay for RT to perform routine trach change.    Lurline Idol was ordered.    Per policy, unable to do routine trach change s/p 5pm, discussed w/ charge RT.

## 2021-07-21 NOTE — Progress Notes (Addendum)
PROGRESS NOTE  Christopher Santiago WCH:[id number] DOB: [birth year] DOA: 07/01/2021 PCP: Tonia Ghent, MD   LOS: 20 days   Brief Narrative / Interim history:  87 year old with past medical history significant for COPD, quit smoking 30 years ago, vocal cord cancer, diabetes who presents complaining of exertional dyspnea, reports sinusitis, postnasal drip, hoarseness for the past 3 to 4 months. He was also complaining of difficulty swallowing, food gets stuck in the throat, he also report phlegm, in his throat. CT maxillofacial, soft tissue neck: Asymmetric soft tissue density/fullness at the base of the right aryepiglottic fold, extending into the right piriform sinus with possible involvement of the right glottis inferiorly.  If echo to measure or define a discrete mass within this region given lack of IV contrast. ENT consulted for direct visualization.  Patient underwent  laryngoscopy with biopsy and tracheostomy 8/10.  received  PEG tube , tolerating tube feeds, cancer treatment with radiation therapy was offered following discharge.  Subjective / 24h Interval events:  Patient himself denies any complaints, no significant events overnight, he was excited that family and his dog were able to visit yesterday.  Assessment & Plan:  Acute Hypoxic Respiratory Failure, tracheostomy in place -Suspect related to component of aspiration due to dysphagia larynx CA, COPD exacerbation.   -He has completed 5 days of IV steroids  - completed  7 days of cefepime with last day 8/21 - Respiratory status has remained stable and wheezing has resolved -respiratory culture with normal respiratory flora -Continue Mucomyst 3 times daily -ENT managing trach care -Patient is not proving, currently tolerating PMV most of the day, where his speech is fluent and very understandable . -Continue with scopolamine patch for secretions .  Squamous cell carcinoma Larynx -CT: With asymmetric soft tissue density fullness at  the base of the right aryepiglottic fold: ENT consulted.  Patient underwent laryngoscopy with biopsy and tracheostomy 8/11. Stable on ATC 5 L oxygen.  -Biopsy showed squamous cell carcinoma with foci suspicious for invasion.  -He was discussed at tumor board, will have outpatient follow-up with medical oncology, radiation oncology.  CT scan of the neck and chest showing that the pulmonary nodules have not progressed since 2019 therefore benign -Trach changed to Shiley 6 Prox XLT Cuffless.  Management per ENT  Nutrition-getting PEG tube placement today  Acute metabolic encephalopathy -Mentation significantly improved, he is awake alert and appropriate right now, he was with significant delirium initially.  1.2 cm spiculated right upper lobe nodule-outpatient follow up   Left Renal mass -Concerning for renal carcinoma, will need to follow-up with urology as an outpatient, I have discussed with daughter, she understands about the importance of follow-up.   Diabetes mellitus - Not on medication. Continue SSI, some labile CBG reading, and some evidence of hypoglycemia, I will decrease her words, and monitor CBG closely.  HTN -Resume metoprolol, Continue with Norvasc.    Leukocytosis probably related to steroids vs infection.  Resolved    Scheduled Meds:  acetaminophen  500 mg Per Tube TID   allopurinol  100 mg Oral Daily   amLODipine  5 mg Oral Daily   arformoterol  15 mcg Nebulization BID   budesonide (PULMICORT) nebulizer solution  0.25 mg Nebulization BID   chlorhexidine gluconate (MEDLINE KIT)  15 mL Mouth Rinse BID   Chlorhexidine Gluconate Cloth  6 each Topical Daily   feeding supplement (PROSource TF)  45 mL Per Tube BID   free water  150 mL Per Tube Q4H   guaiFENesin  10 mL Oral Q8H   insulin aspart  0-5 Units Subcutaneous QHS   insulin aspart  0-9 Units Subcutaneous TID WC   lidocaine  1 application Urethral Once   mouth rinse  15 mL Mouth Rinse 10 times per day   nystatin  5  mL Oral QID   revefenacin  175 mcg Nebulization Daily   scopolamine  1 patch Transdermal Q72H   sennosides  5 mL Oral BID   sodium chloride flush  10-40 mL Intracatheter Q12H   Continuous Infusions:  dextrose 10 % and 0.45 % NaCl 50 mL/hr at 07/20/21 2052   feeding supplement (OSMOLITE 1.5 CAL) 1,000 mL (07/20/21 1033)   PRN Meds:.albuterol, bisacodyl, hydrALAZINE, lip balm, LORazepam, sodium chloride flush, traMADol  Diet Orders (From admission, onward)     Start     Ordered   07/03/21 1433  Diet NPO time specified Except for: Ice Chips  Diet effective now       Comments: Occasional ice chips after oral care  Question:  Except for  Answer:  Ice Chips   07/03/21 1432            DVT prophylaxis: Place and maintain sequential compression device Start: 07/04/21 1027     Code Status: DNR  Family Communication: d/w daughter by phone 8/27  Status is: Inpatient  Remains inpatient appropriate because:IV treatments appropriate due to intensity of illness or inability to take PO  Dispo: The patient is from: Home              Anticipated d/c is to:  CIR              Patient currently is medically stable to d/c.  Awaiting insurance authorization/appeal for CIR   Difficult to place patient No  Level of care: Progressive  Consultants:  ENT Trauma surgery for PEG Palliaitve  PCCM  Procedures:  Laryngoscopy / trach 2d echo IMPRESSIONS   1. Technically difficult study, very poor visualization of left ventricle even after contrast administration. Left ventricular ejection fraction, by estimation, is 55 to 60%. The left ventricle has grossly normal function but very poorly visualized. Left ventricular endocardial border not optimally defined to evaluate regional wall motion. Left ventricular diastolic parameters are indeterminate.   2. Right ventricular systolic function is normal. The right ventricular size is mildly enlarged. Tricuspid regurgitation signal is inadequate for  assessing PA pressure.   3. Right atrial size was moderately dilated.   4. A small pericardial effusion is present. The pericardial effusion is anterior to the right ventricle. Presence of pericardial fat pad.   5. The mitral valve is grossly normal. No evidence of mitral valve regurgitation.   6. The aortic valve was not well visualized. Aortic valve regurgitation is not visualized. No aortic stenosis is present.   7. The inferior vena cava is normal in size with greater than 50% respiratory variability, suggesting right atrial pressure of 3 mmHg.   Microbiology  none  Antimicrobials: None   Objective: Vitals:   07/21/21 0802 07/21/21 0803 07/21/21 0842 07/21/21 1128  BP:   (!) 133/45   Pulse:  80 80 (!) 59  Resp:  19 19 18   Temp:   98.1 F (36.7 C)   TempSrc:   Oral   SpO2: 99% 97% 100% 100%  Weight:      Height:        Intake/Output Summary (Last 24 hours) at 07/21/2021 1535 Last data filed at 07/21/2021 0500 Gross per 24 hour  Intake --  Output 750 ml  Net -750 ml   Filed Weights   07/11/21 0506 07/15/21 0610 07/21/21 0500  Weight: 70.6 kg 69 kg 73.3 kg    Examination:  Awake Alert, Oriented X 3, trach, PMV, speech is very appropriate, eating very pleasant, interactive and follows all commands appropriately.   Symmetrical Chest wall movement, Good air movement bilaterally, CTAB RRR,No Gallops,Rubs or new Murmurs, No Parasternal Heave +ve B.Sounds, Abd Soft, No tenderness, No rebound - guarding or rigidity. No Cyanosis, Clubbing or edema, No new Rash or bruise       Data Reviewed: I have independently reviewed following labs and imaging studies   CBC: Recent Labs  Lab 07/16/21 0331 07/17/21 0321 07/18/21 0333 07/19/21 0221  WBC 10.9* 9.9 9.0 8.8  HGB 10.5* 10.5* 10.3* 9.9*  HCT 32.1* 33.1* 31.7* 31.2*  MCV 94.1 96.5 94.1 94.8  PLT 194 184 175 978   Basic Metabolic Panel: Recent Labs  Lab 07/16/21 0331 07/17/21 0321 07/18/21 0333 07/19/21 0221   NA 139 138 136 138  K 4.0 4.0 3.5 3.9  CL 107 107 104 107  CO2 27 25 24 25   GLUCOSE 123* 150* 123* 174*  BUN 14 18 15 18   CREATININE 0.85 0.77 0.65 0.63  CALCIUM 9.0 9.2 8.9 9.1  PHOS  --  3.5 2.4* 3.0   Liver Function Tests: Recent Labs  Lab 07/16/21 0331  AST 14*  ALT 12  ALKPHOS 42  BILITOT 0.9  PROT 5.7*  ALBUMIN 2.2*   Coagulation Profile: No results for input(s): INR, PROTIME in the last 168 hours. HbA1C: No results for input(s): HGBA1C in the last 72 hours. CBG: Recent Labs  Lab 07/21/21 0610 07/21/21 0809 07/21/21 0835 07/21/21 0927 07/21/21 1232  GLUCAP 315* 46* 63* 263* 133*    No results found for this or any previous visit (from the past 240 hour(s)).     Radiology Studies: No results found.   Phillips Climes MD [facility]  Between 7 am - 7 pm I am available, please contact me via Amion (for emergencies) or Securechat (non urgent messages)  Between 7 pm - 7 am I am not available, please contact night coverage MD/APP via Amion

## 2021-07-21 NOTE — Progress Notes (Signed)
Inpatient Rehab Admissions Coordinator:   Faxed updates to [facility] [employer] per their request.  Hope to hear a determination today.   Shann Medal, PT, DPT Admissions Coordinator [phone] 07/21/21  11:57 AM

## 2021-07-22 DIAGNOSIS — N2889 Other specified disorders of kidney and ureter: Secondary | ICD-10-CM | POA: Diagnosis not present

## 2021-07-22 DIAGNOSIS — J9601 Acute respiratory failure with hypoxia: Secondary | ICD-10-CM | POA: Diagnosis not present

## 2021-07-22 DIAGNOSIS — Z930 Tracheostomy status: Secondary | ICD-10-CM | POA: Diagnosis not present

## 2021-07-22 LAB — CBC
HCT: 32 % — ABNORMAL LOW (ref 39.0–52.0)
Hemoglobin: 10 g/dL — ABNORMAL LOW (ref 13.0–17.0)
MCH: 29.9 pg (ref 26.0–34.0)
MCHC: 31.3 g/dL (ref 30.0–36.0)
MCV: 95.5 fL (ref 80.0–100.0)
Platelets: 175 10*3/uL (ref 150–400)
RBC: 3.35 MIL/uL — ABNORMAL LOW (ref 4.22–5.81)
RDW: 13.2 % (ref 11.5–15.5)
WBC: 6.9 10*3/uL (ref 4.0–10.5)
nRBC: 0 % (ref 0.0–0.2)

## 2021-07-22 LAB — GLUCOSE, CAPILLARY
Glucose-Capillary: 119 mg/dL — ABNORMAL HIGH (ref 70–99)
Glucose-Capillary: 136 mg/dL — ABNORMAL HIGH (ref 70–99)
Glucose-Capillary: 147 mg/dL — ABNORMAL HIGH (ref 70–99)
Glucose-Capillary: 153 mg/dL — ABNORMAL HIGH (ref 70–99)
Glucose-Capillary: 160 mg/dL — ABNORMAL HIGH (ref 70–99)
Glucose-Capillary: 77 mg/dL (ref 70–99)

## 2021-07-22 LAB — BASIC METABOLIC PANEL
Anion gap: 7 (ref 5–15)
BUN: 20 mg/dL (ref 8–23)
CO2: 29 mmol/L (ref 22–32)
Calcium: 9.4 mg/dL (ref 8.9–10.3)
Chloride: 101 mmol/L (ref 98–111)
Creatinine, Ser: 0.65 mg/dL (ref 0.61–1.24)
GFR, Estimated: >60 mL/min (ref >60)
Glucose, Bld: 167 mg/dL — ABNORMAL HIGH (ref 70–99)
Potassium: 4.4 mmol/L (ref 3.5–5.1)
Sodium: 137 mmol/L (ref 135–145)

## 2021-07-22 MED ORDER — INSULIN ASPART 100 UNIT/ML IJ SOLN
0.0000 [IU] | INTRAMUSCULAR | Status: DC
  Administered 2021-07-22: 2 [IU] via SUBCUTANEOUS
  Administered 2021-07-22 – 2021-07-24 (×7): 1 [IU] via SUBCUTANEOUS
  Administered 2021-07-24: 2 [IU] via SUBCUTANEOUS
  Administered 2021-07-25 – 2021-07-27 (×5): 1 [IU] via SUBCUTANEOUS
  Administered 2021-07-27: 2 [IU] via SUBCUTANEOUS
  Administered 2021-07-28 – 2021-07-31 (×13): 1 [IU] via SUBCUTANEOUS
  Administered 2021-08-01 (×2): 2 [IU] via SUBCUTANEOUS
  Administered 2021-08-01 – 2021-08-03 (×6): 1 [IU] via SUBCUTANEOUS
  Administered 2021-08-03: 2 [IU] via SUBCUTANEOUS
  Administered 2021-08-03 – 2021-08-04 (×3): 1 [IU] via SUBCUTANEOUS
  Administered 2021-08-04: 2 [IU] via SUBCUTANEOUS
  Administered 2021-08-04: 1 [IU] via SUBCUTANEOUS
  Administered 2021-08-04: 2 [IU] via SUBCUTANEOUS
  Administered 2021-08-05 – 2021-08-06 (×5): 1 [IU] via SUBCUTANEOUS
  Administered 2021-08-06: 2 [IU] via SUBCUTANEOUS
  Administered 2021-08-06: 1 [IU] via SUBCUTANEOUS
  Administered 2021-08-07: 2 [IU] via SUBCUTANEOUS
  Administered 2021-08-07 – 2021-08-08 (×2): 1 [IU] via SUBCUTANEOUS
  Administered 2021-08-08: 2 [IU] via SUBCUTANEOUS
  Administered 2021-08-08: 1 [IU] via SUBCUTANEOUS
  Administered 2021-08-09: 2 [IU] via SUBCUTANEOUS

## 2021-07-22 NOTE — Progress Notes (Addendum)
PROGRESS NOTE  Christopher Santiago JJH:[id number] DOB: 1931/07/31 DOA: 07/01/2021 PCP: Tonia Ghent, MD   LOS: 21 days   Brief Narrative / Interim history:  87 year old with past medical history significant for COPD, quit smoking 30 years ago, vocal cord cancer, diabetes who presents complaining of exertional dyspnea, reports sinusitis, postnasal drip, hoarseness for the past 3 to 4 months. He was also complaining of difficulty swallowing, food gets stuck in the throat, he also report phlegm, in his throat. CT maxillofacial, soft tissue neck: Asymmetric soft tissue density/fullness at the base of the right aryepiglottic fold, extending into the right piriform sinus with possible involvement of the right glottis inferiorly.  If echo to measure or define a discrete mass within this region given lack of IV contrast. ENT consulted for direct visualization.  Patient underwent  laryngoscopy with biopsy and tracheostomy 8/10.  received  PEG tube , tolerating tube feeds, cancer treatment with radiation therapy was offered following discharge.  Subjective / 24h Interval events:  No significant events overnight, he denies any complaints.  Assessment & Plan:  Acute Hypoxic Respiratory Failure, tracheostomy in place -Suspect related to component of aspiration due to dysphagia larynx CA, COPD exacerbation.   -He has completed 5 days of IV steroids  - completed  7 days of cefepime with last day 8/21 - Respiratory status has remained stable and wheezing has resolved -respiratory culture with normal respiratory flora -Continue Mucomyst 3 times daily -ENT managing trach care -Patient is not proving, currently tolerating PMV most of the day, where his speech is fluent and very understandable . -Continue with scopolamine patch for secretions .  Squamous cell carcinoma Larynx -CT: With asymmetric soft tissue density fullness at the base of the right aryepiglottic fold: ENT consulted.  Patient underwent  laryngoscopy with biopsy and tracheostomy 8/11. Stable on ATC 5 L oxygen.  -Biopsy showed squamous cell carcinoma with foci suspicious for invasion.  -Per ENT note when he was seen and evaluated initially, he was discussed at tumor board, will have outpatient follow-up with medical oncology, radiation oncology.  CT scan of the neck and chest showing that the pulmonary nodules have not progressed since 2019 therefore benign -Trach changed to Shiley 6 Prox XLT Cuffless.  Management per ENT  Nutrition-getting PEG tube placement today  Acute metabolic encephalopathy -Mentation significantly improved, he is awake alert and appropriate right now, he was with significant delirium initially.  1.2 cm spiculated right upper lobe nodule-outpatient follow up   Left Renal mass -Concerning for renal carcinoma, will need to follow-up with urology as an outpatient, I have discussed with daughter, she understands about the importance of follow-up.   Diabetes mellitus  -Overall his CBG has been controlled, I will change his sliding scale to every 4 hours, I will DC his D10 half-normal saline.  HTN -Resume metoprolol, Continue with Norvasc.    Leukocytosis probably related to steroids vs infection.  Resolved    Scheduled Meds:  acetaminophen  500 mg Per Tube TID   allopurinol  100 mg Oral Daily   amLODipine  5 mg Oral Daily   arformoterol  15 mcg Nebulization BID   budesonide (PULMICORT) nebulizer solution  0.25 mg Nebulization BID   chlorhexidine gluconate (MEDLINE KIT)  15 mL Mouth Rinse BID   Chlorhexidine Gluconate Cloth  6 each Topical Daily   feeding supplement (PROSource TF)  45 mL Per Tube BID   free water  150 mL Per Tube Q4H   guaiFENesin  10 mL Oral  Q8H   insulin aspart  0-5 Units Subcutaneous QHS   insulin aspart  0-9 Units Subcutaneous TID WC   lidocaine  1 application Urethral Once   mouth rinse  15 mL Mouth Rinse 10 times per day   nystatin  5 mL Oral QID   revefenacin  175 mcg  Nebulization Daily   scopolamine  1 patch Transdermal Q72H   sennosides  5 mL Oral BID   sodium chloride flush  10-40 mL Intracatheter Q12H   Continuous Infusions:  dextrose 10 % and 0.45 % NaCl 20 mL/hr at 07/21/21 2324   feeding supplement (OSMOLITE 1.5 CAL) 1,000 mL (07/21/21 1724)   PRN Meds:.albuterol, bisacodyl, hydrALAZINE, lip balm, LORazepam, sodium chloride flush, traMADol  Diet Orders (From admission, onward)     Start     Ordered   07/03/21 1433  Diet NPO time specified Except for: Ice Chips  Diet effective now       Comments: Occasional ice chips after oral care  Question:  Except for  Answer:  Ice Chips   07/03/21 1432            DVT prophylaxis: Place and maintain sequential compression device Start: 07/04/21 1027     Code Status: DNR  Family Communication: d/w daughter by phone 8/27, none at bedside today.  - I have Discussed with daughter, explained for her that patient with multiple things need to be followed as an outpatient whenever he gets stable, she is aware of the spiculated lung nodules, left renal mass, and his laryngeal malignancy, this will need to be followed up when he is stable as an outpatient..  Status is: Inpatient  Remains inpatient appropriate because:IV treatments appropriate due to intensity of illness or inability to take PO  Dispo: The patient is from: Home              Anticipated d/c is to:  CIR              Patient currently is medically stable to d/c.  Awaiting insurance authorization/appeal for CIR   Difficult to place patient No  Level of care: Progressive  Consultants:  ENT Trauma surgery for PEG Palliaitve  PCCM  Procedures:  Laryngoscopy / trach 2d echo IMPRESSIONS   1. Technically difficult study, very poor visualization of left ventricle even after contrast administration. Left ventricular ejection fraction, by estimation, is 55 to 60%. The left ventricle has grossly normal function but very poorly visualized.  Left ventricular endocardial border not optimally defined to evaluate regional wall motion. Left ventricular diastolic parameters are indeterminate.   2. Right ventricular systolic function is normal. The right ventricular size is mildly enlarged. Tricuspid regurgitation signal is inadequate for assessing PA pressure.   3. Right atrial size was moderately dilated.   4. A small pericardial effusion is present. The pericardial effusion is anterior to the right ventricle. Presence of pericardial fat pad.   5. The mitral valve is grossly normal. No evidence of mitral valve regurgitation.   6. The aortic valve was not well visualized. Aortic valve regurgitation is not visualized. No aortic stenosis is present.   7. The inferior vena cava is normal in size with greater than 50% respiratory variability, suggesting right atrial pressure of 3 mmHg.   Microbiology  none  Antimicrobials: None   Objective: Vitals:   07/22/21 0755 07/22/21 0800 07/22/21 1100 07/22/21 1200  BP:  136/67  (!) 115/59  Pulse: 61 82 73 97  Resp: (!) 21 15 (!)  21 20  Temp:  98.1 F (36.7 C)  97.9 F (36.6 C)  TempSrc:  Oral  Oral  SpO2:  95% 100% 98%  Weight:      Height:        Intake/Output Summary (Last 24 hours) at 07/22/2021 1524 Last data filed at 07/22/2021 1442 Gross per 24 hour  Intake --  Output 1700 ml  Net -1700 ml   Filed Weights   07/15/21 0610 07/21/21 0500 07/22/21 0428  Weight: 69 kg 73.3 kg 76.2 kg    Examination:  Awake Alert, Oriented X 3, trach, PMV, speech is very appropriate, eating very pleasant, interactive and follows all commands appropriately.   Symmetrical Chest wall movement, Good air movement bilaterally, CTAB RRR,No Gallops,Rubs or new Murmurs, No Parasternal Heave +ve B.Sounds, Abd Soft, No tenderness, No rebound - guarding or rigidity. No Cyanosis, Clubbing or edema, No new Rash or bruise        Data Reviewed: I have independently reviewed following labs and imaging  studies   CBC: Recent Labs  Lab 07/16/21 0331 07/17/21 0321 07/18/21 0333 07/19/21 0221 07/22/21 0213  WBC 10.9* 9.9 9.0 8.8 6.9  HGB 10.5* 10.5* 10.3* 9.9* 10.0*  HCT 32.1* 33.1* 31.7* 31.2* 32.0*  MCV 94.1 96.5 94.1 94.8 95.5  PLT 194 184 175 179 028   Basic Metabolic Panel: Recent Labs  Lab 07/16/21 0331 07/17/21 0321 07/18/21 0333 07/19/21 0221 07/22/21 0213  NA 139 138 136 138 137  K 4.0 4.0 3.5 3.9 4.4  CL 107 107 104 107 101  CO2 _0 GLUCOSE 123* 150* 123* 174* 167*  BUN _1 CREATININE 0.85 0.77 0.65 0.63 0.65  CALCIUM 9.0 9.2 8.9 9.1 9.4  PHOS  --  3.5 2.4* 3.0  --    Liver Function Tests: Recent Labs  Lab 07/16/21 0331  AST 14*  ALT 12  ALKPHOS 42  BILITOT 0.9  PROT 5.7*  ALBUMIN 2.2*   Coagulation Profile: No results for input(s): INR, PROTIME in the last 168 hours. HbA1C: No results for input(s): HGBA1C in the last 72 hours. CBG: Recent Labs  Lab 07/21/21 2327 07/21/21 2344 07/22/21 0351 07/22/21 0739 07/22/21 1153  GLUCAP 154* 143* 160* 147* 119*    No results found for this or any previous visit (from the past 240 hour(s)).     Radiology Studies: No results found.   Phillips Climes MD [facility]  Between 7 am - 7 pm I am available, please contact me via Amion (for emergencies) or Securechat (non urgent messages)  Between 7 pm - 7 am I am not available, please contact night coverage MD/APP via Amion

## 2021-07-22 NOTE — Evaluation (Addendum)
Occupational Therapy Treatment Patient Details Name: Christopher Santiago MRN: [id number] DOB: [birth year] Today's Date: 07/22/2021    History of Present Illness Pt is an 87 y/o male admitted secondary to worsening SOB, especially with exertion. Found to have acute respiratory failure with hypoxia. Imaging showed fullness of the right aryepiglottic fold extending into the right pyriform and L renal mass. Pt had Flexible laryngoscopy on 8/10. Trach in place. Workup pending. PMH includes vocal cord cancer, COPD, CAD, DM, HTN, and R THA.   Clinical Impression   Patient met lying supine in bed in agreement with OT treatment session. Remains motivated. Session with focus on BUE/BLE HEP. Patient tolerated 1 set x10-20 reps each. Please refer below for further details. Patient continues to require Min A grossly for ADLs/ADL transfers and would benefit from continued acute OT services. D/c disposition remains appropriate.     Follow Up Recommendations  CIR    Equipment Recommendations  Other (comment) (Defer to next level of care.)    Recommendations for Other Services Rehab consult     Precautions / Restrictions Precautions Precautions: Fall Precaution Comments: Monitor orthostatic hypotension Restrictions Weight Bearing Restrictions: No      Mobility Bed Mobility Overal bed mobility: Needs Assistance Bed Mobility: Supine to Sit Rolling: Supervision Sidelying to sit: Supervision Supine to sit: Min guard     General bed mobility comments: HOB elevated, increased time, minA for trunk elevation and line management, used bed rail, labored effort    Transfers Overall transfer level: Needs assistance Equipment used: Rolling walker (2 wheeled) Transfers: Sit to/from Stand Sit to Stand: Min assist         General transfer comment: minA to power up and to steady during transition of hands, pt was able to scoot self closer to EOB for optimal position without assist today    Balance  Overall balance assessment: Needs assistance Sitting-balance support: No upper extremity supported;Feet supported Sitting balance-Leahy Scale: Fair Sitting balance - Comments: Patient sat at EOB for 8-10 min with supervision A.   Standing balance support: Bilateral upper extremity supported;During functional activity Standing balance-Leahy Scale: Poor Standing balance comment: Reliant on BUE support with dynamic balance.                           ADL either performed or assessed with clinical judgement   ADL Overall ADL's : Needs assistance/impaired                                             Vision         Perception     Praxis      Pertinent Vitals/Pain Pain Assessment: No/denies pain Pain Intervention(s): Monitored during session     Hand Dominance     Extremity/Trunk Assessment             Communication     Cognition Arousal/Alertness: Awake/alert Behavior During Therapy: WFL for tasks assessed/performed Overall Cognitive Status: History of cognitive impairments - at baseline                                 General Comments: pt very pleasant and eager to work; mild confusion noted with patient attempting to put passy miur valve in mouth instead of on trach.   General Comments  Postive orthostatic hypotension. BP 122/55 in supine,109/53 at EOB and 88/43 in standing. Patient reporting mild dizziness.    Exercises Exercises: General Upper Extremity General Exercises - Upper Extremity Shoulder Flexion: AROM;Strengthening;10 reps;Both Shoulder Extension: AROM;Strengthening;10 reps;Both Shoulder ABduction: AROM;Strengthening;10 reps;Both Elbow Flexion: AROM;Strengthening;10 reps;Both Elbow Extension: AROM;Strengthening;10 reps;Both Wrist Flexion: AROM;Strengthening;10 reps;Both Wrist Extension: AROM;Strengthening;10 reps;Both Composite Extension: AROM;Strengthening;10 reps;Both General Exercises - Lower  Extremity Ankle Circles/Pumps: AROM;Both;10 reps;Supine Quad Sets: Strengthening;Both;10 reps;Seated Long Arc Quad: AROM;Both;10 reps;Seated Hip Flexion/Marching: AROM;Both;10 reps;Seated   Shoulder Instructions      Home Living                                          Prior Functioning/Environment                   OT Problem List:        OT Treatment/Interventions:      OT Goals(Current goals can be found in the care plan section) Acute Rehab OT Goals Patient Stated Goal: To go to CIR OT Goal Formulation: With patient Time For Goal Achievement: 07/31/21 Potential to Achieve Goals: Good ADL Goals Pt Will Perform Eating: Independently Pt Will Perform Grooming: with modified independence;standing Pt Will Perform Upper Body Dressing: Independently Pt Will Perform Lower Body Dressing: with modified independence;sit to/from stand Pt Will Transfer to Toilet: with modified independence;ambulating Pt Will Perform Toileting - Clothing Manipulation and hygiene: with modified independence;sit to/from stand Pt Will Perform Tub/Shower Transfer: Shower transfer;Tub transfer;rolling walker;shower seat Pt/caregiver will Perform Home Exercise Program: Increased ROM;Increased strength;Both right and left upper extremity  OT Frequency: Min 2X/week   Barriers to D/C:            Co-evaluation              AM-PAC OT "6 Clicks" Daily Activity     Outcome Measure Help from another person eating meals?: Total Help from another person taking care of personal grooming?: A Little Help from another person toileting, which includes using toliet, bedpan, or urinal?: A Little Help from another person bathing (including washing, rinsing, drying)?: A Little Help from another person to put on and taking off regular upper body clothing?: A Little Help from another person to put on and taking off regular lower body clothing?: A Lot 6 Click Score: 15   End of Session  Equipment Utilized During Treatment: Oxygen;Other (comment);Rolling walker (Trach collar Fi02 28%) Nurse Communication: Mobility status  Activity Tolerance: Patient tolerated treatment well Patient left: in chair;with call bell/phone within reach;with chair alarm set  OT Visit Diagnosis: Unsteadiness on feet (R26.81);Muscle weakness (generalized) (M62.81)                Time: 5176-1607 OT Time Calculation (min): 32 min Charges:  OT General Charges $OT Visit: 1 Visit OT Treatments $Therapeutic Activity: 8-22 mins $Therapeutic Exercise: 8-22 mins  Tynisha Ogan H. OTR/L Supplemental OT, Department of rehab services [phone]  Virgel Haro R H. 07/22/2021, 9:56 AM

## 2021-07-22 NOTE — Progress Notes (Signed)
Inpatient Rehab Admissions Coordinator:   Received an initial denial from [place].  I will start an expedited appeal today with [facility] Medicare, which should take about 72 hours.  I updated the patient at the bedside and left a voicemail for his daughter, [name].   Shann Medal, PT, DPT Admissions Coordinator [phone] 07/22/21  11:23 AM

## 2021-07-22 NOTE — Progress Notes (Signed)
Inpatient Rehab Admissions Coordinator:   Expedited appeal begun.    Shann Medal, PT, DPT Admissions Coordinator [phone] 07/22/21  2:13 PM

## 2021-07-23 DIAGNOSIS — Z930 Tracheostomy status: Secondary | ICD-10-CM | POA: Diagnosis not present

## 2021-07-23 DIAGNOSIS — J9601 Acute respiratory failure with hypoxia: Secondary | ICD-10-CM | POA: Diagnosis not present

## 2021-07-23 DIAGNOSIS — I10 Essential (primary) hypertension: Secondary | ICD-10-CM | POA: Diagnosis not present

## 2021-07-23 LAB — CBC
HCT: 32.2 % — ABNORMAL LOW (ref 39.0–52.0)
Hemoglobin: 10.2 g/dL — ABNORMAL LOW (ref 13.0–17.0)
MCH: 30.1 pg (ref 26.0–34.0)
MCHC: 31.7 g/dL (ref 30.0–36.0)
MCV: 95 fL (ref 80.0–100.0)
Platelets: 182 10*3/uL (ref 150–400)
RBC: 3.39 MIL/uL — ABNORMAL LOW (ref 4.22–5.81)
RDW: 13.2 % (ref 11.5–15.5)
WBC: 7.2 10*3/uL (ref 4.0–10.5)
nRBC: 0 % (ref 0.0–0.2)

## 2021-07-23 LAB — BASIC METABOLIC PANEL
Anion gap: 7 (ref 5–15)
BUN: 24 mg/dL — ABNORMAL HIGH (ref 8–23)
CO2: 29 mmol/L (ref 22–32)
Calcium: 9.3 mg/dL (ref 8.9–10.3)
Chloride: 102 mmol/L (ref 98–111)
Creatinine, Ser: 0.69 mg/dL (ref 0.61–1.24)
GFR, Estimated: >60 mL/min (ref >60)
Glucose, Bld: 123 mg/dL — ABNORMAL HIGH (ref 70–99)
Potassium: 4.3 mmol/L (ref 3.5–5.1)
Sodium: 138 mmol/L (ref 135–145)

## 2021-07-23 LAB — MAGNESIUM: Magnesium: 2.2 mg/dL (ref 1.7–2.4)

## 2021-07-23 LAB — GLUCOSE, CAPILLARY
Glucose-Capillary: 110 mg/dL — ABNORMAL HIGH (ref 70–99)
Glucose-Capillary: 120 mg/dL — ABNORMAL HIGH (ref 70–99)
Glucose-Capillary: 131 mg/dL — ABNORMAL HIGH (ref 70–99)
Glucose-Capillary: 138 mg/dL — ABNORMAL HIGH (ref 70–99)
Glucose-Capillary: 138 mg/dL — ABNORMAL HIGH (ref 70–99)
Glucose-Capillary: 145 mg/dL — ABNORMAL HIGH (ref 70–99)

## 2021-07-23 NOTE — Progress Notes (Signed)
Physical Therapy Treatment Patient Details Name: Christopher Santiago MRN: [id number] DOB: 17-Jan-1931 Today's Date: 07/23/2021    History of Present Illness Pt is an 87 y/o male admitted secondary to worsening SOB, especially with exertion. Found to have acute respiratory failure with hypoxia. Imaging showed fullness of the right aryepiglottic fold extending into the right pyriform and L renal mass. Pt had Flexible laryngoscopy on 8/10. Trach in place. Workup pending. PMH includes vocal cord cancer, COPD, CAD, DM, HTN, and R THA.    PT Comments    Pt eager to participate, progressing well toward goals.  Emphasis on transitions, sit to stand safety and progression of gait stability/quality/stamina.    Follow Up Recommendations  CIR     Equipment Recommendations  Rolling walker with 5" wheels    Recommendations for Other Services Rehab consult     Precautions / Restrictions Precautions Precautions: Fall Precaution Comments: Monitor orthostatic hypotension, trach, peg    Mobility  Bed Mobility Overal bed mobility: Needs Assistance Bed Mobility: Supine to Sit     Supine to sit: Supervision Sit to supine: Supervision        Transfers Overall transfer level: Needs assistance   Transfers: Sit to/from Stand Sit to Stand: Min assist         General transfer comment: cues for hand placemen/safety, stability assist  Ambulation/Gait Ambulation/Gait assistance: Min guard;Min assist Gait Distance (Feet): 75 Feet (then standing rest) see note, additional 150 feet and additional 90 feet after standing rest.) Assistive device: Rolling walker (2 wheeled) Gait Pattern/deviations: Step-through pattern;Decreased stride length Gait velocity: dec Gait velocity interpretation: <1.8 ft/sec, indicate of risk for recurrent falls General Gait Details: generallly steady, min for walker management, otherwise min guard for stability.  Initially had to stop due to EHR 135 bpm with some dyspnea.   Subsequent standing stops for minor fatigue, HR 100+ bpm, dyspnea 2/4, SpO2 mid 90's   Stairs             Wheelchair Mobility    Modified Rankin (Stroke Patients Only)       Balance     Sitting balance-Leahy Scale: Fair       Standing balance-Leahy Scale: Poor Standing balance comment: reliant on AD or external support                            Cognition Arousal/Alertness: Awake/alert Behavior During Therapy: WFL for tasks assessed/performed Overall Cognitive Status: Within Functional Limits for tasks assessed                                 General Comments: pt very pleasant and eager to work.      Exercises      General Comments        Pertinent Vitals/Pain Pain Assessment: Faces Faces Pain Scale: No hurt    Home Living                      Prior Function            PT Goals (current goals can now be found in the care plan section) Acute Rehab PT Goals Patient Stated Goal: To go to CIR PT Goal Formulation: With patient Time For Goal Achievement: 07/30/21 Potential to Achieve Goals: Good Progress towards PT goals: Progressing toward goals    Frequency    Min 3X/week  PT Plan Current plan remains appropriate    Co-evaluation              AM-PAC PT "6 Clicks" Mobility   Outcome Measure  Help needed turning from your back to your side while in a flat bed without using bedrails?: A Little Help needed moving from lying on your back to sitting on the side of a flat bed without using bedrails?: A Little Help needed moving to and from a bed to a chair (including a wheelchair)?: A Little Help needed standing up from a chair using your arms (e.g., wheelchair or bedside chair)?: A Little Help needed to walk in hospital room?: A Little Help needed climbing 3-5 steps with a railing? : A Lot 6 Click Score: 17    End of Session   Activity Tolerance: Patient tolerated treatment well Patient left: in  bed;with call bell/phone within reach Nurse Communication: Mobility status PT Visit Diagnosis: Unsteadiness on feet (R26.81);Other abnormalities of gait and mobility (R26.89)     Time: 1810-1830 PT Time Calculation (min) (ACUTE ONLY): 20 min  Charges:  $Gait Training: 8-22 mins                     07/23/2021  Ginger Carne., PT Acute Rehabilitation Services [phone]  (pager) [phone]  (office)   Tessie Fass Rhonda Linan 07/23/2021, 7:02 PM

## 2021-07-23 NOTE — Progress Notes (Signed)
Occupational Therapy Treatment Patient Details Name: Christopher Santiago MRN: [id number] DOB: June 11, 1931 Today's Date: 07/23/2021    History of present illness Pt is an 87 y/o male admitted secondary to worsening SOB, especially with exertion. Found to have acute respiratory failure with hypoxia. Imaging showed fullness of the right aryepiglottic fold extending into the right pyriform and L renal mass. Pt had Flexible laryngoscopy on 8/10. Trach in place. Workup pending. PMH includes vocal cord cancer, COPD, CAD, DM, HTN, and R THA.   OT comments  Treatment focused on activity tolerance and monitoring vitals and short ADL task. Patient found supine in bed and agreeable to therapy. Patient transferred to side of bed with supervision. BP 117/52 in sitting. Patient performed standing marching task x 3 sets with a rest break between each set while therapist monitored BP. After first march patient's BP 92/51. After second march patient's BP 105/57, after third march BP 102/66. Patient asymptomatic. Patient required LB bathing task - he was able to wash and dry periarea and anal area with set up only in standing and holding on to walker. Patient's BP after task 116/46. Patient tolerated activity well and predominately limited by lines and leads. Cont POC.     Follow Up Recommendations  CIR    Equipment Recommendations  Other (comment) (defer to next venue)    Recommendations for Other Services      Precautions / Restrictions Precautions Precautions: Fall Precaution Comments: Monitor orthostatic hypotension, trach, peg Restrictions Weight Bearing Restrictions: No       Mobility Bed Mobility Overal bed mobility: Needs Assistance Bed Mobility: Supine to Sit     Supine to sit: Supervision     General bed mobility comments: supervision for bed mobility    Transfers Overall transfer level: Needs assistance Equipment used: Rolling walker (2 wheeled) Transfers: Sit to/from [employer] transfer comment: Performed 3 one minute marches at side of bed while therapist monitored BP. No complaints of fatigue and o2 sat WNL on 28% trach collar.    Balance Overall balance assessment: Mild deficits observed, not formally tested                                         ADL either performed or assessed with clinical judgement   ADL Overall ADL's : Needs assistance/impaired             Lower Body Bathing: Min guard;Sit to/from stand Lower Body Bathing Details (indicate cue type and reason): Patient abel to wash and dry periarea and perianal area in standing with set up only.                             Vision Patient Visual Report: No change from baseline     Perception     Praxis      Cognition   Behavior During Therapy: WFL for tasks assessed/performed Overall Cognitive Status: Within Functional Limits for tasks assessed                                          Exercises     Shoulder Instructions       General Comments  Pertinent Vitals/ Pain       Pain Assessment: No/denies pain  Home Living                                          Prior Functioning/Environment              Frequency  Min 2X/week        Progress Toward Goals  OT Goals(current goals can now be found in the care plan section)  Progress towards OT goals: Progressing toward goals  Acute Rehab OT Goals Patient Stated Goal: To go to CIR OT Goal Formulation: With patient Time For Goal Achievement: 07/31/21 Potential to Achieve Goals: Good  Plan Discharge plan remains appropriate;Frequency remains appropriate    Co-evaluation          OT goals addressed during session: ADL's and self-care (activity tolerance)      AM-PAC OT "6 Clicks" Daily Activity     Outcome Measure   Help from another person eating meals?: Total (NPO) Help from another person taking care of  personal grooming?: A Little Help from another person toileting, which includes using toliet, bedpan, or urinal?: A Little Help from another person bathing (including washing, rinsing, drying)?: A Little Help from another person to put on and taking off regular upper body clothing?: A Little Help from another person to put on and taking off regular lower body clothing?: A Little 6 Click Score: 16    End of Session Equipment Utilized During Treatment: Oxygen;Rolling walker  OT Visit Diagnosis: Unsteadiness on feet (R26.81);Muscle weakness (generalized) (M62.81)   Activity Tolerance Patient tolerated treatment well   Patient Left in chair;with call bell/phone within reach;with chair alarm set   Nurse Communication Mobility status (BP)        Time: 6811-5726 OT Time Calculation (min): 31 min  Charges: OT General Charges $OT Visit: 1 Visit OT Treatments $Self Care/Home Management : 8-22 mins $Therapeutic Activity: 8-22 mins  Takai Chiaramonte, OTR/L [place]  Office [phone] Pager: [place] 07/23/2021, 10:19 AM

## 2021-07-23 NOTE — Progress Notes (Signed)
  Speech Language Pathology Treatment: Dysphagia;Passy Muir Speaking valve  Patient Details Name: Christopher Santiago MRN: [id number] DOB: 1931/10/11 Today's Date: 07/23/2021 Time: 9470-7615 SLP Time Calculation (min) (ACUTE ONLY): 24 min  Assessment / Plan / Recommendation Clinical Impression  Pt was seen for treatment. He was alert and eager to participate in the session. PMSV was in place upon SLP's entry and he denied any respiratory difficulty with it. Pt's RN, Cheri Fowler, reported that the PMSV had been on for at least two hours at the time of the SLP's session with stable vitals and adequate secretion management. Pt tolerated PMSV with vitals ranging RR 15-21, SpO2 97-100%, and HR 68-72. Speech was adequately intelligible with a rough vocal quality and mildly reduced vocal intensity. Pt was educated regarding PMSV donning and doffing. He was able to demonstrate this with intermittent cues for hand placement which were faded with repetition. It is noteworthy; however, that after a delay, pt was noted to feel in his mouth for the PMSV and state, "I can't find it!" Further education and training will likely be needed. Pt tolerated small ice chips without overt s/sx of aspiration, but exhibited significant coughing with thin liquids via tsp. Pt completed limited repetitions of the masako and effortful swallows with frequent prompts and decreasing accuracy. PMSV may now be used during all waking hours and pt may have small ice following oral care. SLP will continue to follow pt.    HPI HPI: Pt is an 87 y/o male admitted 8/10 secondary to worsening SOB, especially with exertion. Found to have acute respiratory failure with hypoxia. Imaging showed fullness of the right aryepiglottic fold extending into the right pyriform and L renal mass. Pt had flexible laryngoscopy on 8/10, including trach.  Findings revealed bilateral true vocal folds in paramedian position with normal adduction, restricted abduction causing  narrowing of the airway. No evidence of airway obstruction. Mucosal abnormality/ edema of right supraglottis/glottis noted with no exophytic mass lesion, biopsy showed squamous cell carcinoma. PEG placement 07/15/21.  PMH includes vocal cord cancer ~ 30 years ago, COPD, CAD, DM, HTN, R THA, bilateral hearing aids.      SLP Plan  Continue with current plan of care       Recommendations  Diet recommendations: NPO (with allowance of individual ice chips following oral care) Medication Administration: Via alternative means      Patient may use Passy-Muir Speech Valve: During all therapies with supervision PMSV Supervision: Intermittent MD: Please consider changing trach tube to : Smaller size         Oral Care Recommendations: Oral care QID Follow up Recommendations: Skilled Nursing facility SLP Visit Diagnosis: Aphonia (R49.1);Dysphagia, unspecified (R13.10) Plan: Continue with current plan of care       Thao Bauza I. Hardin Negus, [place], [place] Office number [phone] Pager [place] 07/23/2021, 11:56 AM

## 2021-07-23 NOTE — Progress Notes (Signed)
PROGRESS NOTE  ARK AGRUSA QHU:[id number] DOB: 09/20/1931 DOA: 07/01/2021 PCP: Tonia Ghent, MD   LOS: 22 days   Brief Narrative / Interim history: 87 year old with past medical history significant for COPD, quit smoking 30 years ago, vocal cord cancer, diabetes who presents complaining of exertional dyspnea, reports sinusitis, postnasal drip, hoarseness for the past 3 to 4 months. He was also complaining of difficulty swallowing, food gets stuck in the throat, he also report phlegm, in his throat. CT maxillofacial, soft tissue neck: Asymmetric soft tissue density/fullness at the base of the right aryepiglottic fold, extending into the right piriform sinus with possible involvement of the right glottis inferiorly.  If echo to measure or define a discrete mass within this region given lack of IV contrast. ENT consulted for direct visualization.  Patient underwent  laryngoscopy with biopsy and tracheostomy 8/10.  received  PEG tube , tolerating tube feeds, cancer treatment with radiation therapy was offered following discharge.    Subjective Patient was able to communicate to me that he was feeling well.  Denies any pain.  Waiting to go to rehab.   Assessment & Plan:  Acute Hypoxic Respiratory Failure, tracheostomy in place -Suspect related to component of aspiration due to dysphagia larynx CA, COPD exacerbation.   -He has completed 5 days of IV steroids  - Completed  7 days of cefepime with last day 8/21 -Respiratory culture with normal respiratory flora -ENT managing trach care -Patient is tolerating PM Valve most of the day, where his speech is fluent and very understandable . -Continue with scopolamine patch for secretions .  Squamous cell carcinoma Larynx -CT showed asymmetric soft tissue density fullness at the base of the right aryepiglottic fold. ENT consulted.  Patient underwent laryngoscopy with biopsy and tracheostomy 8/11. Stable on ATC 5 L oxygen.  -Biopsy showed  squamous cell carcinoma with foci suspicious for invasion.  -Per ENT note when he was seen and evaluated initially, he was discussed at tumor board, will have outpatient follow-up with medical oncology, radiation oncology.  CT scan of the neck and chest showing that the pulmonary nodules have not progressed since 2019 therefore benign -Trach changed to Shiley 6 Prox XLT Cuffless.  Management per ENT  Nutrition: PEG tube was placed few days ago.   Acute metabolic encephalopathy Had significant delirium initially but mentation appears to have improved and could be close to baseline.  1.2 cm spiculated right upper lobe nodule-outpatient follow up   Left Renal mass -Concerning for renal carcinoma, will need to follow-up with urology as an outpatient.  These findings were communicated to patient's daughter.   Diabetes mellitus type II, controlled HbA1c 5.8.  Monitor CBGs.  SSI.  Essential hypertension: Blood pressure stable.  Noted to be on amlodipine.  Normocytic anemia No evidence of overt bleeding.  Continue to monitor.   Leukocytosis probably related to steroids vs infection.  Resolved   DVT prophylaxis: On SCDs CODE STATUS: DNR  Family Communication: No family at bedside. Disposition: Patient's insurance denied CIR.  They have appealed.  Status is: Inpatient  Not inpatient appropriate, will call UM team and downgrade to OBS.   Dispo: The patient is from: Home              Anticipated d/c is to: CIR              Patient currently is not medically stable to d/c.   Difficult to place patient No   Consultants:  ENT Trauma surgery for PEG  Palliaitve  PCCM  Procedures:  Laryngoscopy / trach 2d echo IMPRESSIONS   1. Technically difficult study, very poor visualization of left ventricle even after contrast administration. Left ventricular ejection fraction, by estimation, is 55 to 60%. The left ventricle has grossly normal function but very poorly visualized. Left ventricular  endocardial border not optimally defined to evaluate regional wall motion. Left ventricular diastolic parameters are indeterminate.   2. Right ventricular systolic function is normal. The right ventricular size is mildly enlarged. Tricuspid regurgitation signal is inadequate for assessing PA pressure.   3. Right atrial size was moderately dilated.   4. A small pericardial effusion is present. The pericardial effusion is anterior to the right ventricle. Presence of pericardial fat pad.   5. The mitral valve is grossly normal. No evidence of mitral valve regurgitation.   6. The aortic valve was not well visualized. Aortic valve regurgitation is not visualized. No aortic stenosis is present.   7. The inferior vena cava is normal in size with greater than 50% respiratory variability, suggesting right atrial pressure of 3 mmHg.   Microbiology  none  Antimicrobials: None Scheduled Meds:  acetaminophen  500 mg Per Tube TID   allopurinol  100 mg Oral Daily   amLODipine  5 mg Oral Daily   arformoterol  15 mcg Nebulization BID   budesonide (PULMICORT) nebulizer solution  0.25 mg Nebulization BID   chlorhexidine gluconate (MEDLINE KIT)  15 mL Mouth Rinse BID   Chlorhexidine Gluconate Cloth  6 each Topical Daily   feeding supplement (PROSource TF)  45 mL Per Tube BID   free water  150 mL Per Tube Q4H   guaiFENesin  10 mL Oral Q8H   insulin aspart  0-9 Units Subcutaneous Q4H   lidocaine  1 application Urethral Once   mouth rinse  15 mL Mouth Rinse 10 times per day   nystatin  5 mL Oral QID   revefenacin  175 mcg Nebulization Daily   scopolamine  1 patch Transdermal Q72H   sennosides  5 mL Oral BID   sodium chloride flush  10-40 mL Intracatheter Q12H   Continuous Infusions:  feeding supplement (OSMOLITE 1.5 CAL) 1,000 mL (07/23/21 0903)   PRN Meds:.albuterol, bisacodyl, hydrALAZINE, lip balm, LORazepam, sodium chloride flush, traMADol  Diet Orders (From admission, onward)     Start      Ordered   07/03/21 1433  Diet NPO time specified Except for: Ice Chips  Diet effective now       Comments: Occasional ice chips after oral care  Question:  Except for  Answer:  Ice Chips   07/03/21 1432               Objective: Vitals:   07/23/21 0759 07/23/21 0829 07/23/21 1200 07/23/21 1204  BP:  (!) 137/45 (!) 114/52   Pulse:  68 (!) 136   Resp: 16 16 17    Temp:  98.3 F (36.8 C) 98.6 F (37 C) (P) 98 F (36.7 C)  TempSrc:  Oral Oral   SpO2:  96% 96%   Weight:      Height:        Intake/Output Summary (Last 24 hours) at 07/23/2021 1335 Last data filed at 07/23/2021 0618 Gross per 24 hour  Intake --  Output 900 ml  Net -900 ml    Filed Weights   07/21/21 0500 07/22/21 0428 07/23/21 0500  Weight: 73.3 kg 76.2 kg 73.2 kg    Examination:   General appearance: Awake alert.  In no distress Tracheostomy is noted Resp: Clear to auscultation bilaterally.  Normal effort Cardio: S1-S2 is normal regular.  No S3-S4.  No rubs murmurs or bruit GI: Abdomen is soft.  PEG tube was noted.  Nontender nondistended.  Bowel sounds are present normal.  No masses organomegaly Extremities: No edema.  Able to move all of his extremities Neurologic: No focal neurological deficits.       Data Reviewed: I have independently reviewed following labs and imaging studies   CBC: Recent Labs  Lab 07/17/21 0321 07/18/21 0333 07/19/21 0221 07/22/21 0213 07/23/21 0103  WBC 9.9 9.0 8.8 6.9 7.2  HGB 10.5* 10.3* 9.9* 10.0* 10.2*  HCT 33.1* 31.7* 31.2* 32.0* 32.2*  MCV 96.5 94.1 94.8 95.5 95.0  PLT 184 175 179 175 065    Basic Metabolic Panel: Recent Labs  Lab 07/17/21 0321 07/18/21 0333 07/19/21 0221 07/22/21 0213 07/23/21 0103  NA 138 136 138 137 138  K 4.0 3.5 3.9 4.4 4.3  CL 107 104 107 101 102  CO2 25 24 25 29 29   GLUCOSE 150* 123* 174* 167* 123*  BUN 18 15 18 20  24*  CREATININE 0.77 0.65 0.63 0.65 0.69  CALCIUM 9.2 8.9 9.1 9.4 9.3  MG  --   --   --   --  2.2   PHOS 3.5 2.4* 3.0  --   --     CBG: Recent Labs  Lab 07/22/21 1955 07/22/21 2339 07/23/21 0337 07/23/21 0754 07/23/21 1203  GLUCAP 77 136* 138* 145* 131*      Radiology Studies: No results found.   Bonnielee Haff  [facility] Pager: [email or web address]

## 2021-07-23 NOTE — Progress Notes (Signed)
Pt had pause of 3.66 sec, returned to sinus rhythm, VS stable, no change in pt condition. Pt has hx of pause. Opyd, MD notified.

## 2021-07-24 DIAGNOSIS — E43 Unspecified severe protein-calorie malnutrition: Secondary | ICD-10-CM | POA: Diagnosis not present

## 2021-07-24 DIAGNOSIS — J9601 Acute respiratory failure with hypoxia: Secondary | ICD-10-CM | POA: Diagnosis not present

## 2021-07-24 LAB — GLUCOSE, CAPILLARY
Glucose-Capillary: 125 mg/dL — ABNORMAL HIGH (ref 70–99)
Glucose-Capillary: 157 mg/dL — ABNORMAL HIGH (ref 70–99)
Glucose-Capillary: 110 mg/dL — ABNORMAL HIGH (ref 70–99)
Glucose-Capillary: 117 mg/dL — ABNORMAL HIGH (ref 70–99)
Glucose-Capillary: 131 mg/dL — ABNORMAL HIGH (ref 70–99)

## 2021-07-24 NOTE — Progress Notes (Signed)
Nutrition Follow-up  DOCUMENTATION CODES:  Severe malnutrition in context of chronic illness  INTERVENTION:  Continue TF via PEG: -Osmolite 1.5 cal @ 55 ml/hr (1320 ml/day) -ProSource TF 45 ml BID -144m free water flushes Q4H   Provides 2060 kcal, 105 grams of protein, and 1006 ml of H2O (1906 ml free water total with flushes)  NUTRITION DIAGNOSIS:  Severe Malnutrition related to chronic illness (COPD) as evidenced by severe fat depletion, severe muscle depletion. - ongoing  GOAL:  Patient will meet greater than or equal to 90% of their needs - met with TF  MONITOR:  Diet advancement, Labs, Weight trends, Skin, I & O's  REASON FOR ASSESSMENT:  Consult Assessment of nutrition requirement/status  ASSESSMENT:  87year old male who presented to the ED on 8/09 with SOB and dysphagia. PMH of COPD, CAD, depression, DM, HTN, HLD, laryngeal cancer treated ~30 years ago, OSA.  8/10 - s/p laryngeal biopsy and tracheostomy 8/12 - Cortrak placed, tip of tube in stomach 8/15 - trach exchanged to #6 XLT-P uncuffed 8/20 - Cortrak pulled by patient 8/22 - Cortrak replaced (gastric tip) 8/23 - PEG placed; trach exchanged (still #6 XLT-P uncuffed)  Per MD, pt's mentation appears to have improved and could be close to baseline. Pt reports feeling well, eager to discharge to CIR. Remains NPO, SLP following. Per RN, pt tolerating TF via PEG. Current TF regimen: Osmolite 1.5 cal @ 55 ml/hr (1320 ml/day) w/ ProSource TF 45 ml BID and 1542mfree water flushes Q4H. This provides 2060 kcal, 105 grams of protein, and 1006 ml of H2O (1906 ml free water total with flushes).   UOP: 1.5L documented x24 hours I/O: -1.3L since admit  Medications: SSI, Senokot Labs reviewed. CBGs: 110-157 over 24 hours  Diet Order:   Diet Order             Diet NPO time specified Except for: Ice Chips  Diet effective now                  EDUCATION NEEDS:  No education needs have been identified at this  time  Skin:  Skin Assessment: Reviewed RN Assessment Skin Integrity Issues:: Incisions Incisions: neck s/p trach  Last BM:  8/29  Height:  Ht Readings from Last 1 Encounters:  07/02/21 _0  (1.676 m)   Weight:  Wt Readings from Last 1 Encounters:  07/24/21 76.3 kg   BMI:  Body mass index is 27.15 kg/m.  Estimated Nutritional Needs:  Kcal:  2000-2200 kcal Protein:  95-115 grams Fluid:  >/= 2 L/day   AmLarkin InaMS, RD, LDN (she/her/hers) RD pager number and weekend/on-call pager number located in [place]

## 2021-07-24 NOTE — Progress Notes (Signed)
PROGRESS NOTE  LYNDELL ALLAIRE TMH:[id number] DOB: [birth year] DOA: 07/01/2021 PCP: Tonia Ghent, MD   LOS: 23 days   Brief Narrative / Interim history: 87 year old with past medical history significant for COPD, quit smoking 30 years ago, vocal cord cancer, diabetes who presents complaining of exertional dyspnea, reports sinusitis, postnasal drip, hoarseness for the past 3 to 4 months. He was also complaining of difficulty swallowing, food gets stuck in the throat, he also report phlegm, in his throat. CT maxillofacial, soft tissue neck: Asymmetric soft tissue density/fullness at the base of the right aryepiglottic fold, extending into the right piriform sinus with possible involvement of the right glottis inferiorly.  If echo to measure or define a discrete mass within this region given lack of IV contrast. ENT consulted for direct visualization.  Patient underwent  laryngoscopy with biopsy and tracheostomy 8/10.  received  PEG tube , tolerating tube feeds, cancer treatment with radiation therapy was offered following discharge.    Subjective Patient denies any complaints.     Assessment & Plan:  Acute Hypoxic Respiratory Failure, tracheostomy in place -Suspect related to component of aspiration due to dysphagia larynx CA, COPD exacerbation.   -He has completed 5 days of IV steroids  -Completed 7 days of cefepime with last day 8/21 -Respiratory culture with normal respiratory flora -ENT has been managing trach care -Patient is tolerating PM Valve most of the day, where his speech is fluent and very understandable. -Continue with scopolamine patch for secretions.  Squamous cell carcinoma Larynx -CT showed asymmetric soft tissue density fullness at the base of the right aryepiglottic fold. ENT was consulted. Patient underwent laryngoscopy with biopsy and tracheostomy 8/11. Stable on ATC 5 L oxygen.  -Biopsy showed squamous cell carcinoma with foci suspicious for invasion.  -Per ENT note  when he was seen and evaluated initially, he was discussed at tumor board, will have outpatient follow-up with medical oncology, radiation oncology.  CT scan of the neck and chest showing that the pulmonary nodules have not progressed since 2019, therefore benign -Trach changed to Shiley 6 Prox XLT Cuffless.  Management per ENT  Severe protein calorie nutrition: Continue tube feedings via PEG.  Acute metabolic encephalopathy Had significant delirium initially but this appears to have resolved.  Seems to be back to baseline.  1.2 cm spiculated right upper lobe nodule-outpatient follow up   Left Renal mass -Concerning for renal carcinoma, will need to follow-up with urology as an outpatient.  These findings were communicated to patient's daughter.   Diabetes mellitus type II, controlled HbA1c 5.8.  Monitor CBGs.  SSI.  Essential hypertension: Blood pressure stable.  Noted to be on amlodipine.  Normocytic anemia No evidence of overt bleeding.  Continue to monitor.   Leukocytosis probably related to steroids vs infection.  Resolved   DVT prophylaxis: On SCDs CODE STATUS: DNR  Family Communication: No family at bedside. Disposition: Patient's insurance denied CIR.  They have appealed.  Status is: Inpatient  Not inpatient appropriate, will call UM team and downgrade to OBS.   Dispo: The patient is from: Home              Anticipated d/c is to: CIR              Patient currently is not medically stable to d/c.   Difficult to place patient No   Consultants:  ENT Trauma surgery for PEG Palliaitve  PCCM  Procedures:  Laryngoscopy / trach 2d echo IMPRESSIONS   1. Technically difficult  study, very poor visualization of left ventricle even after contrast administration. Left ventricular ejection fraction, by estimation, is 55 to 60%. The left ventricle has grossly normal function but very poorly visualized. Left ventricular endocardial border not optimally defined to evaluate  regional wall motion. Left ventricular diastolic parameters are indeterminate.   2. Right ventricular systolic function is normal. The right ventricular size is mildly enlarged. Tricuspid regurgitation signal is inadequate for assessing PA pressure.   3. Right atrial size was moderately dilated.   4. A small pericardial effusion is present. The pericardial effusion is anterior to the right ventricle. Presence of pericardial fat pad.   5. The mitral valve is grossly normal. No evidence of mitral valve regurgitation.   6. The aortic valve was not well visualized. Aortic valve regurgitation is not visualized. No aortic stenosis is present.   7. The inferior vena cava is normal in size with greater than 50% respiratory variability, suggesting right atrial pressure of 3 mmHg.   Microbiology  none  Antimicrobials: None Scheduled Meds:  acetaminophen  500 mg Per Tube TID   allopurinol  100 mg Oral Daily   amLODipine  5 mg Oral Daily   arformoterol  15 mcg Nebulization BID   budesonide (PULMICORT) nebulizer solution  0.25 mg Nebulization BID   chlorhexidine gluconate (MEDLINE KIT)  15 mL Mouth Rinse BID   Chlorhexidine Gluconate Cloth  6 each Topical Daily   feeding supplement (PROSource TF)  45 mL Per Tube BID   free water  150 mL Per Tube Q4H   guaiFENesin  10 mL Oral Q8H   insulin aspart  0-9 Units Subcutaneous Q4H   lidocaine  1 application Urethral Once   mouth rinse  15 mL Mouth Rinse 10 times per day   nystatin  5 mL Oral QID   revefenacin  175 mcg Nebulization Daily   scopolamine  1 patch Transdermal Q72H   sennosides  5 mL Oral BID   sodium chloride flush  10-40 mL Intracatheter Q12H   Continuous Infusions:  feeding supplement (OSMOLITE 1.5 CAL) 1,000 mL (07/23/21 0903)   PRN Meds:.albuterol, bisacodyl, hydrALAZINE, lip balm, LORazepam, sodium chloride flush, traMADol  Diet Orders (From admission, onward)     Start     Ordered   07/03/21 1433  Diet NPO time specified Except  for: Ice Chips  Diet effective now       Comments: Occasional ice chips after oral care  Question:  Except for  Answer:  Ice Chips   07/03/21 1432               Objective: Vitals:   07/24/21 0500 07/24/21 0750 07/24/21 0800 07/24/21 0825  BP:   (!) 134/49   Pulse:  66 61 65  Resp:  _0 Temp:   97.9 F (36.6 C)   TempSrc:   Oral   SpO2:   100% 100%  Weight: 76.3 kg     Height:        Intake/Output Summary (Last 24 hours) at 07/24/2021 1145 Last data filed at 07/24/2021 0800 Gross per 24 hour  Intake 10 ml  Output 1500 ml  Net -1490 ml    Filed Weights   07/22/21 0428 07/23/21 0500 07/24/21 0500  Weight: 76.2 kg 73.2 kg 76.3 kg    Examination:  General appearance: Awake alert.  In no distress Tracheostomy is noted Resp: Clear to auscultation bilaterally.  Normal effort Cardio: S1-S2 is normal regular.  No S3-S4.  No  rubs murmurs or bruit GI: Abdomen is soft.  PEG tube is noted.   Extremities: No edema.  Neurologic: No focal neurological deficits.       Data Reviewed: I have independently reviewed following labs and imaging studies   CBC: Recent Labs  Lab 07/18/21 0333 07/19/21 0221 07/22/21 0213 07/23/21 0103  WBC 9.0 8.8 6.9 7.2  HGB 10.3* 9.9* 10.0* 10.2*  HCT 31.7* 31.2* 32.0* 32.2*  MCV 94.1 94.8 95.5 95.0  PLT 175 179 175 953    Basic Metabolic Panel: Recent Labs  Lab 07/18/21 0333 07/19/21 0221 07/22/21 0213 07/23/21 0103  NA 136 138 137 138  K 3.5 3.9 4.4 4.3  CL 104 107 101 102  CO2 _0 GLUCOSE 123* 174* 167* 123*  BUN _1 24*  CREATININE 0.65 0.63 0.65 0.69  CALCIUM 8.9 9.1 9.4 9.3  MG  --   --   --  2.2  PHOS 2.4* 3.0  --   --     CBG: Recent Labs  Lab 07/23/21 1604 07/23/21 1948 07/23/21 2337 07/24/21 0359 07/24/21 0810  GLUCAP 120* 110* 138* 125* 157*      Radiology Studies: No results found.   Bonnielee Haff  [facility] Pager: [email or web address]

## 2021-07-24 NOTE — Progress Notes (Signed)
  Speech Language Pathology Treatment:    Patient Details Name: Christopher Santiago MRN: [id number] DOB: May 25, 1931 Today's Date: 07/24/2021 Time: 3646-8032 SLP Time Calculation (min) (ACUTE ONLY): 18 min  Assessment / Plan / Recommendation Clinical Impression  Pt was seen for PMSV treatment. He was pleasant and cooperative throughout the session. Pt's RN reported that the pt has been tolerating the PMSV well. Pt denied any difficulty and stated that he is happy to be able to communicate verbally. Pt participated in conversation and he was able to adequately coordinate respiration with speech with min cues and self-correction. Pt tolerated PMSV for the duration of the session with stable vitals and no evidence of air trapping. Pt initially denied recall of education from yesterday on donning and doffing PMSV. However, he was able to demonstrate this with min cues.  Pt was educated regarding PMSV care and precautions. He were referred to the provided PMSV literature, and he indicated that he will review the book since he enjoys reading. SLP will continue to follow pt.    HPI HPI: Pt is an 87 y/o male admitted 8/10 secondary to worsening SOB, especially with exertion. Found to have acute respiratory failure with hypoxia. Imaging showed fullness of the right aryepiglottic fold extending into the right pyriform and L renal mass. Pt had flexible laryngoscopy on 8/10, including trach.  Findings revealed bilateral true vocal folds in paramedian position with normal adduction, restricted abduction causing narrowing of the airway. No evidence of airway obstruction. Mucosal abnormality/ edema of right supraglottis/glottis noted with no exophytic mass lesion, biopsy showed squamous cell carcinoma. PEG placement 07/15/21.  PMH includes vocal cord cancer ~ 30 years ago, COPD, CAD, DM, HTN, R THA, bilateral hearing aids.      SLP Plan  Continue with current plan of care       Recommendations  Diet recommendations:  NPO (ice chips allowed) Medication Administration: Via alternative means      Patient may use Passy-Muir Speech Valve: During all waking hours (remove during sleep) PMSV Supervision: Intermittent MD: Please consider changing trach tube to : Smaller size         Oral Care Recommendations: Oral care QID Follow up Recommendations: Skilled Nursing facility SLP Visit Diagnosis: Aphonia (R49.1);Dysphagia, unspecified (R13.10) Plan: Continue with current plan of care       Jaspreet Hollings I. Hardin Negus, [place], [place] Office number [phone] Pager [place] 07/24/2021, 11:33 AM

## 2021-07-24 NOTE — Progress Notes (Signed)
Physical Therapy Treatment Patient Details Name: Christopher Santiago MRN: [id number] DOB: [birth year] Today's Date: 07/24/2021    History of Present Illness Pt is an 87 y/o male admitted secondary to worsening SOB, especially with exertion. Found to have acute respiratory failure with hypoxia. Imaging showed fullness of the right aryepiglottic fold extending into the right pyriform and L renal mass. Pt had Flexible laryngoscopy on 8/10. Trach in place. Workup pending. PMH includes vocal cord cancer, COPD, CAD, DM, HTN, and R THA.    PT Comments    Pt steadily progressing toward goals, Limited mostly by increasing RR and fatigue within gait trials.  Emphasis on transitions, sit to stands and progression of gait stability with RW and stamina.    Follow Up Recommendations  CIR     Equipment Recommendations  Rolling walker with 5" wheels    Recommendations for Other Services Rehab consult     Precautions / Restrictions Precautions Precautions: Fall Precaution Comments: Monitor orthostatic hypotension, trach, peg    Mobility  Bed Mobility Overal bed mobility: Needs Assistance Bed Mobility: Supine to Sit;Sit to Supine     Supine to sit: Supervision Sit to supine: Supervision   General bed mobility comments: pt needs extra time to move OOB and back to supine without assist.    Transfers Overall transfer level: Needs assistance Equipment used: Rolling walker (2 wheeled) Transfers: Sit to/from Stand Sit to Stand: Min assist         General transfer comment: cues for hand placemen/safety, stability assist  Ambulation/Gait Ambulation/Gait assistance: Min guard;Min assist Gait Distance (Feet): 100 Feet (110 feet, then 90 feet with 1 standing rest and 2 sitting rest.) Assistive device: Rolling walker (2 wheeled) Gait Pattern/deviations: Step-through pattern     General Gait Details: generally steady, SpO2 96% on RA, EHR upper 90's to 120's as pt fatigued.  RR for the low 20's to  40's with last trial of gait.  Pt's breathing "ragged" with higher RR though sats still mid 90's.   Stairs             Wheelchair Mobility    Modified Rankin (Stroke Patients Only)       Balance Overall balance assessment: Mild deficits observed, not formally tested   Sitting balance-Leahy Scale: Fair       Standing balance-Leahy Scale: Poor Standing balance comment: reliant on AD or external support                            Cognition Arousal/Alertness: Awake/alert Behavior During Therapy: WFL for tasks assessed/performed Overall Cognitive Status: Within Functional Limits for tasks assessed                                 General Comments: pt very pleasant and eager to work.      Exercises      General Comments        Pertinent Vitals/Pain Pain Assessment: Faces Faces Pain Scale: No hurt Pain Intervention(s): Monitored during session    Home Living                      Prior Function            PT Goals (current goals can now be found in the care plan section) Acute Rehab PT Goals Patient Stated Goal: To go to CIR PT Goal Formulation: With patient Time  For Goal Achievement: 07/30/21 Potential to Achieve Goals: Good Progress towards PT goals: Progressing toward goals    Frequency    Min 3X/week      PT Plan Current plan remains appropriate    Co-evaluation              AM-PAC PT "6 Clicks" Mobility   Outcome Measure  Help needed turning from your back to your side while in a flat bed without using bedrails?: A Little Help needed moving from lying on your back to sitting on the side of a flat bed without using bedrails?: A Little Help needed moving to and from a bed to a chair (including a wheelchair)?: A Little Help needed standing up from a chair using your arms (e.g., wheelchair or bedside chair)?: A Little Help needed to walk in hospital room?: A Little Help needed climbing 3-5 steps with a  railing? : A Lot 6 Click Score: 17    End of Session   Activity Tolerance: Patient tolerated treatment well Patient left: in bed;with call bell/phone within reach Nurse Communication: Mobility status PT Visit Diagnosis: Unsteadiness on feet (R26.81);Other abnormalities of gait and mobility (R26.89)     Time: 1855-0158 PT Time Calculation (min) (ACUTE ONLY): 28 min  Charges:  $Gait Training: 8-22 mins $Therapeutic Activity: 8-22 mins                     07/24/2021  Ginger Carne., PT Acute Rehabilitation Services [phone]  (pager) [phone]  (office)   Tessie Fass Trenee Igoe 07/24/2021, 7:10 PM

## 2021-07-24 NOTE — Progress Notes (Signed)
Inpatient Rehab Admissions Coordinator:   Faxed updated clinicals to [facility] per request. Will continue to follow.   Shann Medal, PT, DPT Admissions Coordinator [phone] 07/24/21  4:25 PM

## 2021-07-24 NOTE — Progress Notes (Signed)
Inpatient Rehab Admissions Coordinator:   Awaiting determination from [facility] Medicare on expedited appeal.  Should hear back by tomorrow at the latest.   Shann Medal, PT, DPT Admissions Coordinator [phone] 07/24/21  11:57 AM

## 2021-07-24 NOTE — Progress Notes (Signed)
Oncology Nurse Navigator Documentation   Placed introductory call to new referral patient Christopher Santiago daughter [name]. Introduced myself as the H&N oncology nurse navigator that works with Dr. Isidore Moos to whom he has been referred by Dr. Fredric Dine She confirmed understanding of referral. Briefly explained my role as his navigator, provided my contact information.  Confirmed understanding of upcoming appts and [place] location, explained arrival and registration process. I encouraged her/them to call with questions/concerns as he moves forward with appts and procedures.   She verbalized understanding of information provided, expressed appreciation for my call.   Navigator Initial Assessment Employment Status: he is retired Currently on [employer] / STD: na Living Situation: he lives with his wife. His daughter plans to live with them when he is discharged from the hospital Support System: wife, daughter PCP:  PCD: Financial Concerns: Transportation Needs: no Sensory Deficits: Engineer, building services Needed:  no Ambulation Needs: no DME Used in Home: no Psychosocial Needs:  no Concerns/Needs Understanding Cancer:  addressed/answered by navigator to best of ability Self-Expressed Needs: no   Clinical biochemist, BSN, OCN Head & Neck Oncology Nurse [place] [facility] Phone # [phone]  Fax # [phone]

## 2021-07-25 LAB — GLUCOSE, CAPILLARY
Glucose-Capillary: 142 mg/dL — ABNORMAL HIGH (ref 70–99)
Glucose-Capillary: 131 mg/dL — ABNORMAL HIGH (ref 70–99)
Glucose-Capillary: 115 mg/dL — ABNORMAL HIGH (ref 70–99)
Glucose-Capillary: 117 mg/dL — ABNORMAL HIGH (ref 70–99)
Glucose-Capillary: 113 mg/dL — ABNORMAL HIGH (ref 70–99)

## 2021-07-25 NOTE — Progress Notes (Signed)
PROGRESS NOTE  Christopher Santiago BBC:[id number] DOB: Nov 01, 1931 DOA: 07/01/2021 PCP: Tonia Ghent, MD   LOS: 24 days   Brief Narrative / Interim history: 87 year old with past medical history significant for COPD, quit smoking 30 years ago, vocal cord cancer, diabetes who presents complaining of exertional dyspnea, reports sinusitis, postnasal drip, hoarseness for the past 3 to 4 months. He was also complaining of difficulty swallowing, food gets stuck in the throat, he also report phlegm, in his throat. CT maxillofacial, soft tissue neck: Asymmetric soft tissue density/fullness at the base of the right aryepiglottic fold, extending into the right piriform sinus with possible involvement of the right glottis inferiorly.  If echo to measure or define a discrete mass within this region given lack of IV contrast. ENT consulted for direct visualization.  Patient underwent  laryngoscopy with biopsy and tracheostomy 8/10.  received  PEG tube , tolerating tube feeds, cancer treatment with radiation therapy was offered following discharge.    Subjective Patient feels a little congested this morning but otherwise he feels well.  No nausea or vomiting.  Slept well.   Assessment & Plan:  Acute Hypoxic Respiratory Failure, tracheostomy in place -Suspect related to component of aspiration due to dysphagia larynx CA, COPD exacerbation.   -He has completed 5 days of IV steroids  -Completed 7 days of cefepime with last day 8/21 -Respiratory culture with normal respiratory flora -ENT has been managing trach care -Patient is tolerating PM Valve most of the day, where his speech is fluent and very understandable. -Continue with scopolamine patch for secretions. Stable for the most part.  Squamous cell carcinoma Larynx -CT showed asymmetric soft tissue density fullness at the base of the right aryepiglottic fold. ENT was consulted. Patient underwent laryngoscopy with biopsy and tracheostomy 8/11. Stable on  ATC 5 L oxygen.  -Biopsy showed squamous cell carcinoma with foci suspicious for invasion.  -Per ENT note when he was seen and evaluated initially, he was discussed at tumor board, will have outpatient follow-up with medical oncology, radiation oncology.  CT scan of the neck and chest showing that the pulmonary nodules have not progressed since 2019, therefore benign -Trach changed to Shiley 6 Prox XLT Cuffless.  Management per ENT  Severe protein calorie nutrition: Continue tube feedings via PEG.  Acute metabolic encephalopathy Had significant delirium initially but this appears to have resolved.  Seems to be back to baseline.  1.2 cm spiculated right upper lobe nodule-outpatient follow up   Left Renal mass -Concerning for renal carcinoma, will need to follow-up with urology as an outpatient.  These findings were communicated to patient's daughter by previous providers.   Diabetes mellitus type II, controlled HbA1c 5.8.  Monitor CBGs.  SSI.  Essential hypertension: Blood pressure stable.  Noted to be on amlodipine.  Normocytic anemia No evidence of overt bleeding.  Hemoglobin was 10.2 on 8/31.  Continue to monitor.   Leukocytosis probably related to steroids vs infection.  Resolved   DVT prophylaxis: On SCDs CODE STATUS: DNR  Family Communication: No family at bedside. Disposition: Patient's insurance denied CIR.  They have appealed.  Waiting on final decision.  Status is: Inpatient  Not inpatient appropriate, will call UM team and downgrade to OBS.   Dispo: The patient is from: Home              Anticipated d/c is to: CIR              Patient currently is not medically stable to d/c.  Difficult to place patient No   Consultants:  ENT Trauma surgery for PEG Palliaitve  PCCM  Procedures:  Laryngoscopy / trach 2d echo IMPRESSIONS   1. Technically difficult study, very poor visualization of left ventricle even after contrast administration. Left ventricular ejection  fraction, by estimation, is 55 to 60%. The left ventricle has grossly normal function but very poorly visualized. Left ventricular endocardial border not optimally defined to evaluate regional wall motion. Left ventricular diastolic parameters are indeterminate.   2. Right ventricular systolic function is normal. The right ventricular size is mildly enlarged. Tricuspid regurgitation signal is inadequate for assessing PA pressure.   3. Right atrial size was moderately dilated.   4. A small pericardial effusion is present. The pericardial effusion is anterior to the right ventricle. Presence of pericardial fat pad.   5. The mitral valve is grossly normal. No evidence of mitral valve regurgitation.   6. The aortic valve was not well visualized. Aortic valve regurgitation is not visualized. No aortic stenosis is present.   7. The inferior vena cava is normal in size with greater than 50% respiratory variability, suggesting right atrial pressure of 3 mmHg.   Microbiology  none  Antimicrobials: None Scheduled Meds:  acetaminophen  500 mg Per Tube TID   allopurinol  100 mg Oral Daily   amLODipine  5 mg Oral Daily   arformoterol  15 mcg Nebulization BID   budesonide (PULMICORT) nebulizer solution  0.25 mg Nebulization BID   chlorhexidine gluconate (MEDLINE KIT)  15 mL Mouth Rinse BID   Chlorhexidine Gluconate Cloth  6 each Topical Daily   feeding supplement (PROSource TF)  45 mL Per Tube BID   free water  150 mL Per Tube Q4H   guaiFENesin  10 mL Oral Q8H   insulin aspart  0-9 Units Subcutaneous Q4H   lidocaine  1 application Urethral Once   mouth rinse  15 mL Mouth Rinse 10 times per day   nystatin  5 mL Oral QID   revefenacin  175 mcg Nebulization Daily   scopolamine  1 patch Transdermal Q72H   sennosides  5 mL Oral BID   sodium chloride flush  10-40 mL Intracatheter Q12H   Continuous Infusions:  feeding supplement (OSMOLITE 1.5 CAL) 1,000 mL (07/23/21 0903)   PRN Meds:.albuterol,  bisacodyl, hydrALAZINE, lip balm, LORazepam, sodium chloride flush, traMADol  Diet Orders (From admission, onward)     Start     Ordered   07/03/21 1433  Diet NPO time specified Except for: Ice Chips  Diet effective now       Comments: Occasional ice chips after oral care  Question:  Except for  Answer:  Ice Chips   07/03/21 1432               Objective: Vitals:   07/25/21 0500 07/25/21 0736 07/25/21 0800 07/25/21 0919  BP:  (!) 126/46 (!) 118/45 (!) 120/50  Pulse:  62 65 73  Resp:  18 (!) 23 14  Temp:   98.1 F (36.7 C) 98.3 F (36.8 C)  TempSrc:   Oral Oral  SpO2:  99% 98% 98%  Weight: 76.4 kg     Height:        Intake/Output Summary (Last 24 hours) at 07/25/2021 1032 Last data filed at 07/25/2021 0843 Gross per 24 hour  Intake --  Output 950 ml  Net -950 ml    Filed Weights   07/23/21 0500 07/24/21 0500 07/25/21 0500  Weight: 73.2 kg 76.3 kg  76.4 kg    Examination:  Awake alert.  In no distress Tracheostomy is noted Lungs revealed equal air entry bilaterally.  Coarse breath sounds with few crackles at the bases.  No wheezing or rhonchi. S1-S2 is normal regular Abdomen is soft.  PEG tube is noted No obvious focal neurological deficits.      Data Reviewed: I have independently reviewed following labs and imaging studies   CBC: Recent Labs  Lab 07/19/21 0221 07/22/21 0213 07/23/21 0103  WBC 8.8 6.9 7.2  HGB 9.9* 10.0* 10.2*  HCT 31.2* 32.0* 32.2*  MCV 94.8 95.5 95.0  PLT 179 175 925    Basic Metabolic Panel: Recent Labs  Lab 07/19/21 0221 07/22/21 0213 07/23/21 0103  NA 138 137 138  K 3.9 4.4 4.3  CL 107 101 102  CO2 _0 GLUCOSE 174* 167* 123*  BUN 18 20 24*  CREATININE 0.63 0.65 0.69  CALCIUM 9.1 9.4 9.3  MG  --   --  2.2  PHOS 3.0  --   --     CBG: Recent Labs  Lab 07/24/21 1151 07/24/21 1617 07/24/21 1925 07/25/21 0445 07/25/21 0759  GLUCAP 110* 117* 131* 142* 131*      Radiology Studies: No results  found.   Bonnielee Haff  [facility] Pager: [email or web address]

## 2021-07-25 NOTE — Progress Notes (Signed)
Occupational Therapy Treatment Patient Details Name: Christopher Santiago MRN: [id number] DOB: 22-Mar-1931 Today's Date: 07/25/2021    History of present illness Pt is an 87 y/o male admitted secondary to worsening SOB, especially with exertion. Found to have acute respiratory failure with hypoxia. Imaging showed fullness of the right aryepiglottic fold extending into the right pyriform and L renal mass. Pt had Flexible laryngoscopy on 8/10. Trach in place. Workup pending. PMH includes vocal cord cancer, COPD, CAD, DM, HTN, and R THA.   OT comments  Patient seen by skilled OT to address UE strengthening HEP while seated on eob with BP monitored.  Patient was able to get to eob and performed orange theraband exercises for 3 sets of 10 with BP 132/73 supine, 126/86 following 10 minutes of sitting EOB an 120/50 after 20.  Acute OT to continue to follow.  Follow Up Recommendations  CIR    Equipment Recommendations  Other (comment)    Recommendations for Other Services Rehab consult    Precautions / Restrictions Precautions Precautions: Fall Precaution Comments: Monitor orthostatic hypotension, trach, peg       Mobility Bed Mobility Overal bed mobility: Needs Assistance Bed Mobility: Supine to Sit;Sit to Supine Rolling: Supervision Sidelying to sit: Supervision Supine to sit: Supervision Sit to supine: Supervision   General bed mobility comments: pt needs extra time to move OOB and back to supine without assist.    Transfers       Sit to Stand: Min assist              Balance Overall balance assessment: Needs assistance Sitting-balance support: No upper extremity supported;Feet supported Sitting balance-Leahy Scale: Fair Sitting balance - Comments: Patient sat on eob for 15-20 minutes with suprvison performing UE strengthening   Standing balance support: Bilateral upper extremity supported;During functional activity Standing balance-Leahy Scale: Poor Standing balance  comment: reliant on AD or external support                           ADL either performed or assessed with clinical judgement   ADL                                               Vision       Perception     Praxis      Cognition Arousal/Alertness: Awake/alert Behavior During Therapy: WFL for tasks assessed/performed Overall Cognitive Status: Within Functional Limits for tasks assessed                                 General Comments: pt very pleasant and eager to work.        Exercises Exercises: General Upper Extremity;Shoulder (Therapy band exercises) General Exercises - Upper Extremity Shoulder Flexion: Strengthening;Seated;Theraband;10 reps (3 sets) Theraband Level (Shoulder Flexion): Level 2 (Red) Shoulder ABduction: Strengthening;10 reps;Both;Theraband (3 sets) Theraband Level (Shoulder Abduction): Level 2 (Red)   Shoulder Instructions       General Comments      Pertinent Vitals/ Pain       Pain Assessment: No/denies pain  Home Living  Prior Functioning/Environment              Frequency  Min 2X/week        Progress Toward Goals  OT Goals(current goals can now be found in the care plan section)  Progress towards OT goals: Progressing toward goals  Acute Rehab OT Goals Patient Stated Goal: To go to CIR OT Goal Formulation: With patient Time For Goal Achievement: 07/31/21 Potential to Achieve Goals: Good ADL Goals Pt Will Perform Eating: Independently Pt Will Perform Grooming: with modified independence;standing Pt Will Perform Upper Body Dressing: Independently Pt Will Perform Lower Body Dressing: with modified independence;sit to/from stand Pt Will Transfer to Toilet: with modified independence;ambulating Pt Will Perform Toileting - Clothing Manipulation and hygiene: with modified independence;sit to/from stand Pt Will Perform  Tub/Shower Transfer: Shower transfer;Tub transfer;rolling walker;shower seat Pt/caregiver will Perform Home Exercise Program: Increased ROM;Increased strength;Both right and left upper extremity  Plan Discharge plan remains appropriate;Frequency remains appropriate    Co-evaluation                 AM-PAC OT "6 Clicks" Daily Activity     Outcome Measure   Help from another person eating meals?: Total Help from another person taking care of personal grooming?: A Little Help from another person toileting, which includes using toliet, bedpan, or urinal?: A Little Help from another person bathing (including washing, rinsing, drying)?: A Little Help from another person to put on and taking off regular upper body clothing?: A Little Help from another person to put on and taking off regular lower body clothing?: A Little 6 Click Score: 16    End of Session Equipment Utilized During Treatment: Oxygen;Rolling walker  OT Visit Diagnosis: Unsteadiness on feet (R26.81);Muscle weakness (generalized) (M62.81)   Activity Tolerance Patient tolerated treatment well   Patient Left in bed;with call bell/phone within reach   Nurse Communication Mobility status        Time: 4982-6415 OT Time Calculation (min): 27 min  Charges: OT Treatments $Therapeutic Exercise: 23-37 mins  Lodema Hong, OTA    Trixie Dredge 07/25/2021, 9:37 AM

## 2021-07-25 NOTE — Progress Notes (Signed)
Inpatient Rehab Admissions Coordinator:   Received notification that expedited appeal for CIR has been completed and denial has been upheld.  I will not be able to admit Mr. Afonso to CIR and [facility] will need to pursue other rehab options [facility] versus SNF).  I will let pt/family know.   Shann Medal, PT, DPT Admissions Coordinator [phone] 07/25/21  12:18 PM

## 2021-07-26 DIAGNOSIS — J9601 Acute respiratory failure with hypoxia: Secondary | ICD-10-CM | POA: Diagnosis not present

## 2021-07-26 DIAGNOSIS — E43 Unspecified severe protein-calorie malnutrition: Secondary | ICD-10-CM | POA: Diagnosis not present

## 2021-07-26 LAB — GLUCOSE, CAPILLARY
Glucose-Capillary: 103 mg/dL — ABNORMAL HIGH (ref 70–99)
Glucose-Capillary: 104 mg/dL — ABNORMAL HIGH (ref 70–99)
Glucose-Capillary: 120 mg/dL — ABNORMAL HIGH (ref 70–99)
Glucose-Capillary: 127 mg/dL — ABNORMAL HIGH (ref 70–99)
Glucose-Capillary: 128 mg/dL — ABNORMAL HIGH (ref 70–99)
Glucose-Capillary: 91 mg/dL (ref 70–99)

## 2021-07-26 LAB — T4, FREE: Free T4: 0.99 ng/dL (ref 0.61–1.12)

## 2021-07-26 LAB — TSH: TSH: 1.991 u[IU]/mL (ref 0.350–4.500)

## 2021-07-26 NOTE — Progress Notes (Addendum)
PROGRESS NOTE  Christopher Santiago:[id number] DOB: 1931-04-07 DOA: 07/01/2021 PCP: Tonia Ghent, MD   LOS: 25 days   Brief Narrative / Interim history: 87 year old with past medical history significant for COPD, quit smoking 30 years ago, vocal cord cancer, diabetes who presents complaining of exertional dyspnea, reports sinusitis, postnasal drip, hoarseness for the past 3 to 4 months. He was also complaining of difficulty swallowing, food gets stuck in the throat, he also report phlegm, in his throat. CT maxillofacial, soft tissue neck: Asymmetric soft tissue density/fullness at the base of the right aryepiglottic fold, extending into the right piriform sinus with possible involvement of the right glottis inferiorly.  If echo to measure or define a discrete mass within this region given lack of IV contrast. ENT consulted for direct visualization.  Patient underwent  laryngoscopy with biopsy and tracheostomy 8/10.  received  PEG tube , tolerating tube feeds, cancer treatment with radiation therapy was offered following discharge.    Subjective Patient slept well overnight.  Denies any complaints this morning.  Disappointed that insurance company denied inpatient rehabilitation.   Assessment & Plan:  Acute Hypoxic Respiratory Failure, tracheostomy in place -Suspect related to component of aspiration due to dysphagia larynx CA, COPD exacerbation.   -He has completed 5 days of IV steroids  -Completed 7 days of cefepime with last day 8/21 -Respiratory culture with normal respiratory flora -ENT has been managing trach care -Patient is tolerating PM Valve most of the day, where his speech is fluent and very understandable. -Continue with scopolamine patch for secretions. Patient remained stable.  Bradycardia Called by nursing staff that patient's heart rate was in the 40s and 50s.  Patient asymptomatic.  Blood pressure is stable.  Telemetry reviewed.  He has been noted to have pauses but this  has been ongoing for a while.  EKG was done which shows sinus bradycardia.  Medications were reviewed and no culprit was identified.  We will check thyroid function tests.  Continue to monitor on telemetry for now.  Wonder if he has been excessively suctioning himself causing a vagal response.  Squamous cell carcinoma Larynx -CT showed asymmetric soft tissue density fullness at the base of the right aryepiglottic fold. ENT was consulted. Patient underwent laryngoscopy with biopsy and tracheostomy 8/11. Stable on ATC 5 L oxygen.  -Biopsy showed squamous cell carcinoma with foci suspicious for invasion.  -Per ENT note when he was seen and evaluated initially, he was discussed at tumor board, will have outpatient follow-up with medical oncology, radiation oncology.  CT scan of the neck and chest showing that the pulmonary nodules have not progressed since 2019, therefore benign -Trach changed to Shiley 6 Prox XLT Cuffless.  Management per ENT Patient to be followed at cancer center eventually.  Severe protein calorie nutrition: Continue tube feedings via PEG.  Acute metabolic encephalopathy Had significant delirium initially but this appears to have resolved.  Seems to be back to baseline.  1.2 cm spiculated right upper lobe nodule-outpatient follow up   Left Renal mass -Concerning for renal carcinoma, will need to follow-up with urology as an outpatient.  These findings were communicated to patient's daughter by previous providers.   Diabetes mellitus type II, controlled HbA1c 5.8.  Monitor CBGs.  SSI.  Essential hypertension: Blood pressure stable.  Noted to be on amlodipine.  Normocytic anemia No evidence of overt bleeding.  Hemoglobin was 10.2 on 8/31.  Recheck labs in few days.   Leukocytosis probably related to steroids vs infection.  Resolved   DVT prophylaxis: On SCDs CODE STATUS: DNR  Family Communication: No family at bedside. Disposition: Patient's insurance denied CIR.  They  have appealed.  Waiting on final decision.  Status is: Inpatient  Remains inpatient appropriate because:Inpatient level of care appropriate due to severity of illness  Dispo: The patient is from: Home              Anticipated d/c is to:  To be determined, most likely SNF              Patient currently is not medically stable to d/c.   Difficult to place patient No     Consultants:  ENT Trauma surgery for PEG Palliaitve  PCCM  Procedures:  Laryngoscopy / trach 2d echo IMPRESSIONS   1. Technically difficult study, very poor visualization of left ventricle even after contrast administration. Left ventricular ejection fraction, by estimation, is 55 to 60%. The left ventricle has grossly normal function but very poorly visualized. Left ventricular endocardial border not optimally defined to evaluate regional wall motion. Left ventricular diastolic parameters are indeterminate.   2. Right ventricular systolic function is normal. The right ventricular size is mildly enlarged. Tricuspid regurgitation signal is inadequate for assessing PA pressure.   3. Right atrial size was moderately dilated.   4. A small pericardial effusion is present. The pericardial effusion is anterior to the right ventricle. Presence of pericardial fat pad.   5. The mitral valve is grossly normal. No evidence of mitral valve regurgitation.   6. The aortic valve was not well visualized. Aortic valve regurgitation is not visualized. No aortic stenosis is present.   7. The inferior vena cava is normal in size with greater than 50% respiratory variability, suggesting right atrial pressure of 3 mmHg.   Microbiology  none  Antimicrobials: None Scheduled Meds:  acetaminophen  500 mg Per Tube TID   allopurinol  100 mg Oral Daily   amLODipine  5 mg Oral Daily   arformoterol  15 mcg Nebulization BID   budesonide (PULMICORT) nebulizer solution  0.25 mg Nebulization BID   chlorhexidine gluconate (MEDLINE KIT)  15 mL  Mouth Rinse BID   Chlorhexidine Gluconate Cloth  6 each Topical Daily   feeding supplement (PROSource TF)  45 mL Per Tube BID   free water  150 mL Per Tube Q4H   guaiFENesin  10 mL Oral Q8H   insulin aspart  0-9 Units Subcutaneous Q4H   lidocaine  1 application Urethral Once   mouth rinse  15 mL Mouth Rinse 10 times per day   nystatin  5 mL Oral QID   revefenacin  175 mcg Nebulization Daily   scopolamine  1 patch Transdermal Q72H   sennosides  5 mL Oral BID   sodium chloride flush  10-40 mL Intracatheter Q12H   Continuous Infusions:  feeding supplement (OSMOLITE 1.5 CAL) 1,000 mL (07/26/21 0837)   PRN Meds:.albuterol, bisacodyl, hydrALAZINE, lip balm, LORazepam, sodium chloride flush, traMADol  Diet Orders (From admission, onward)     Start     Ordered   07/03/21 1433  Diet NPO time specified Except for: Ice Chips  Diet effective now       Comments: Occasional ice chips after oral care  Question:  Except for  Answer:  Ice Chips   07/03/21 1432               Objective: Vitals:   07/25/21 2310 07/26/21 0343 07/26/21 0736 07/26/21 0742  BP:   Marland Kitchen)  136/56   Pulse: 71  66   Resp: 13  20   Temp:  98.1 F (36.7 C) 97.6 F (36.4 C)   TempSrc:  Oral Oral   SpO2: 100%  100% 100%  Weight:  74.8 kg    Height:        Intake/Output Summary (Last 24 hours) at 07/26/2021 1122 Last data filed at 07/26/2021 0500 Gross per 24 hour  Intake 8400 ml  Output 600 ml  Net 7800 ml    Filed Weights   07/24/21 0500 07/25/21 0500 07/26/21 0343  Weight: 76.3 kg 76.4 kg 74.8 kg    Examination:  Patient is awake alert.  In no distress Tracheostomy is noted. Lungs reveal equal air entry bilaterally.  Coarse breath sounds with few crackles in the bases. S1-S2 is normal regular Abdomen is soft.  PEG tube is noted.     Data Reviewed: I have independently reviewed following labs and imaging studies   CBC: Recent Labs  Lab 07/22/21 0213 07/23/21 0103  WBC 6.9 7.2  HGB 10.0*  10.2*  HCT 32.0* 32.2*  MCV 95.5 95.0  PLT 175 626    Basic Metabolic Panel: Recent Labs  Lab 07/22/21 0213 07/23/21 0103  NA 137 138  K 4.4 4.3  CL 101 102  CO2 29 29  GLUCOSE 167* 123*  BUN 20 24*  CREATININE 0.65 0.69  CALCIUM 9.4 9.3  MG  --  2.2    CBG: Recent Labs  Lab 07/25/21 1620 07/25/21 2031 07/26/21 0049 07/26/21 0408 07/26/21 0734  GLUCAP 117* 113* 104* 127* 128*      Radiology Studies: No results found.   Bonnielee Haff  [facility] Pager: [email or web address]

## 2021-07-26 NOTE — Progress Notes (Signed)
Pt heart rate has been irregular, trending back and forth between sinus brady and sinus rhythm, vitals were taken, pt is asymptomatic, MD is aware, orders were delegated.

## 2021-07-27 DIAGNOSIS — R001 Bradycardia, unspecified: Secondary | ICD-10-CM

## 2021-07-27 LAB — GLUCOSE, CAPILLARY
Glucose-Capillary: 107 mg/dL — ABNORMAL HIGH (ref 70–99)
Glucose-Capillary: 121 mg/dL — ABNORMAL HIGH (ref 70–99)
Glucose-Capillary: 133 mg/dL — ABNORMAL HIGH (ref 70–99)
Glucose-Capillary: 149 mg/dL — ABNORMAL HIGH (ref 70–99)
Glucose-Capillary: 174 mg/dL — ABNORMAL HIGH (ref 70–99)
Glucose-Capillary: 95 mg/dL (ref 70–99)

## 2021-07-27 NOTE — Plan of Care (Signed)

## 2021-07-27 NOTE — Progress Notes (Signed)
PROGRESS NOTE  Christopher Santiago YHC:[id number] DOB: December 16, 1930 DOA: 07/01/2021 PCP: Tonia Ghent, MD   LOS: 26 days   Brief Narrative / Interim history: 87 year old with past medical history significant for COPD, quit smoking 30 years ago, vocal cord cancer, diabetes who presents complaining of exertional dyspnea, reports sinusitis, postnasal drip, hoarseness for the past 3 to 4 months. He was also complaining of difficulty swallowing, food gets stuck in the throat, he also report phlegm, in his throat. CT maxillofacial, soft tissue neck: Asymmetric soft tissue density/fullness at the base of the right aryepiglottic fold, extending into the right piriform sinus with possible involvement of the right glottis inferiorly.  If echo to measure or define a discrete mass within this region given lack of IV contrast. ENT consulted for direct visualization.  Patient underwent  laryngoscopy with biopsy and tracheostomy 8/10.  received  PEG tube , tolerating tube feeds, cancer treatment with radiation therapy was offered following discharge.    Subjective Patient noted to be asleep this morning.  Upon waking up he denied any complaints.  Stated that he slept well.  Denies any nausea or vomiting.  No chest pain.  No shortness of breath.     Assessment & Plan:  Acute Hypoxic Respiratory Failure, tracheostomy in place -Suspect related to component of aspiration due to dysphagia larynx CA, COPD exacerbation.   -He has completed 5 days of IV steroids  -Completed 7 days of cefepime with last day 8/21 -Respiratory culture with normal respiratory flora -ENT has been managing trach care -Patient is tolerating PM Valve most of the day, where his speech is fluent and very understandable. -Continue with scopolamine patch for secretions. Respiratory status remains stable.  He is on trach collar.  Saturations are in the late 90s.  Signs bradycardia/pauses Noted to have sinus bradycardia yesterday.  Patient is  completely asymptomatic.  He was noted to have pauses on telemetry but this was noted previously as well.  Thyroid function tests are normal.  Electrolytes were normal when last checked.  We will repeat labs tomorrow.   Wonder if he has been excessively suctioning himself causing a vagal response. Heart rate noted to be better this morning.  Squamous cell carcinoma Larynx -CT showed asymmetric soft tissue density fullness at the base of the right aryepiglottic fold. ENT was consulted. Patient underwent laryngoscopy with biopsy and tracheostomy 8/11. Stable on ATC 5 L oxygen.  -Biopsy showed squamous cell carcinoma with foci suspicious for invasion.  -Per ENT note when he was seen and evaluated initially, he was discussed at tumor board, will have outpatient follow-up with medical oncology, radiation oncology.  CT scan of the neck and chest showing that the pulmonary nodules have not progressed since 2019, therefore benign -Trach changed to Shiley 6 Prox XLT Cuffless.  Management per ENT Patient to be followed at cancer center eventually.  Severe protein calorie nutrition: Continue tube feedings via PEG.  Acute metabolic encephalopathy Had significant delirium initially but this appears to have resolved.  Seems to be back to baseline.  1.2 cm spiculated right upper lobe nodule-outpatient follow up   Left Renal mass -Concerning for renal carcinoma, will need to follow-up with urology as an outpatient.  These findings were communicated to patient's daughter by previous providers.   Diabetes mellitus type II, controlled HbA1c 5.8.  Monitor CBGs.  SSI.  Essential hypertension: Blood pressure stable.  Noted to be on amlodipine.  Normocytic anemia No evidence of overt bleeding.  Hemoglobin was 10.2 on  8/31.  Recheck labs in few days.   Leukocytosis probably related to steroids vs infection.  Resolved   DVT prophylaxis: On SCDs CODE STATUS: DNR  Family Communication: No family at  bedside. Disposition: Patient's insurance denied CIR.  Will likely need to go to SNF.  Status is: Inpatient  Remains inpatient appropriate because:Inpatient level of care appropriate due to severity of illness  Dispo: The patient is from: Home              Anticipated d/c is to:  To be determined, most likely SNF              Patient currently is not medically stable to d/c.   Difficult to place patient No     Consultants:  ENT Trauma surgery for PEG Palliaitve  PCCM  Procedures:  Laryngoscopy / trach 2d echo IMPRESSIONS   1. Technically difficult study, very poor visualization of left ventricle even after contrast administration. Left ventricular ejection fraction, by estimation, is 55 to 60%. The left ventricle has grossly normal function but very poorly visualized. Left ventricular endocardial border not optimally defined to evaluate regional wall motion. Left ventricular diastolic parameters are indeterminate.   2. Right ventricular systolic function is normal. The right ventricular size is mildly enlarged. Tricuspid regurgitation signal is inadequate for assessing PA pressure.   3. Right atrial size was moderately dilated.   4. A small pericardial effusion is present. The pericardial effusion is anterior to the right ventricle. Presence of pericardial fat pad.   5. The mitral valve is grossly normal. No evidence of mitral valve regurgitation.   6. The aortic valve was not well visualized. Aortic valve regurgitation is not visualized. No aortic stenosis is present.   7. The inferior vena cava is normal in size with greater than 50% respiratory variability, suggesting right atrial pressure of 3 mmHg.   Microbiology  none  Antimicrobials: None Scheduled Meds:  acetaminophen  500 mg Per Tube TID   allopurinol  100 mg Oral Daily   amLODipine  5 mg Oral Daily   arformoterol  15 mcg Nebulization BID   budesonide (PULMICORT) nebulizer solution  0.25 mg Nebulization BID    chlorhexidine gluconate (MEDLINE KIT)  15 mL Mouth Rinse BID   Chlorhexidine Gluconate Cloth  6 each Topical Daily   feeding supplement (PROSource TF)  45 mL Per Tube BID   free water  150 mL Per Tube Q4H   guaiFENesin  10 mL Oral Q8H   insulin aspart  0-9 Units Subcutaneous Q4H   lidocaine  1 application Urethral Once   mouth rinse  15 mL Mouth Rinse 10 times per day   nystatin  5 mL Oral QID   revefenacin  175 mcg Nebulization Daily   scopolamine  1 patch Transdermal Q72H   sennosides  5 mL Oral BID   sodium chloride flush  10-40 mL Intracatheter Q12H   Continuous Infusions:  feeding supplement (OSMOLITE 1.5 CAL) 1,000 mL (07/27/21 0434)   PRN Meds:.albuterol, bisacodyl, hydrALAZINE, lip balm, LORazepam, sodium chloride flush, traMADol  Diet Orders (From admission, onward)     Start     Ordered   07/03/21 1433  Diet NPO time specified Except for: Ice Chips  Diet effective now       Comments: Occasional ice chips after oral care  Question:  Except for  Answer:  Ice Chips   07/03/21 1432               Objective:  Vitals:   07/27/21 0334 07/27/21 0342 07/27/21 0736 07/27/21 0749  BP:   (!) 138/57   Pulse:  65 63   Resp:  14 17   Temp: 97.9 F (36.6 C)  98.4 F (36.9 C)   TempSrc: Oral  Axillary   SpO2:  97% 100% 100%  Weight: 73.5 kg     Height:       No intake or output data in the 24 hours ending 07/27/21 1151  Filed Weights   07/25/21 0500 07/26/21 0343 07/27/21 0334  Weight: 76.4 kg 74.8 kg 73.5 kg    Examination:  General appearance: Awake alert.  In no distress Tracheostomy is noted Resp: Coarse breath sounds bilaterally.  Few crackles at the bases.  No wheezing or rhonchi. Cardio: S1-S2 is normal regular.  No S3-S4.  No rubs murmurs or bruit GI: Abdomen is soft.  PEG tube is noted.     Data Reviewed: I have independently reviewed following labs and imaging studies   CBC: Recent Labs  Lab 07/22/21 0213 07/23/21 0103  WBC 6.9 7.2  HGB 10.0*  10.2*  HCT 32.0* 32.2*  MCV 95.5 95.0  PLT 175 546    Basic Metabolic Panel: Recent Labs  Lab 07/22/21 0213 07/23/21 0103  NA 137 138  K 4.4 4.3  CL 101 102  CO2 29 29  GLUCOSE 167* 123*  BUN 20 24*  CREATININE 0.65 0.69  CALCIUM 9.4 9.3  MG  --  2.2    CBG: Recent Labs  Lab 07/26/21 1557 07/26/21 1943 07/27/21 0013 07/27/21 0332 07/27/21 0736  GLUCAP 120* 91 133* 149* 174*      Radiology Studies: No results found.   Bonnielee Haff  [facility] Pager: [email or web address]

## 2021-07-27 NOTE — Plan of Care (Signed)
  Problem: Education: Goal: Knowledge of General Education information will improve Description: Including pain rating scale, medication(s)/side effects and non-pharmacologic comfort measures Outcome: Progressing   Problem: Activity: Goal: Risk for activity intolerance will decrease Outcome: Progressing   Problem: Nutrition: Goal: Adequate nutrition will be maintained Outcome: Progressing   Problem: Coping: Goal: Level of anxiety will decrease Outcome: Progressing   

## 2021-07-28 DIAGNOSIS — R001 Bradycardia, unspecified: Secondary | ICD-10-CM | POA: Diagnosis not present

## 2021-07-28 LAB — BASIC METABOLIC PANEL
Anion gap: 8 (ref 5–15)
BUN: 31 mg/dL — ABNORMAL HIGH (ref 8–23)
CO2: 28 mmol/L (ref 22–32)
Calcium: 9.5 mg/dL (ref 8.9–10.3)
Chloride: 100 mmol/L (ref 98–111)
Creatinine, Ser: 0.75 mg/dL (ref 0.61–1.24)
GFR, Estimated: >60 mL/min (ref >60)
Glucose, Bld: 147 mg/dL — ABNORMAL HIGH (ref 70–99)
Potassium: 4.5 mmol/L (ref 3.5–5.1)
Sodium: 136 mmol/L (ref 135–145)

## 2021-07-28 LAB — GLUCOSE, CAPILLARY
Glucose-Capillary: 116 mg/dL — ABNORMAL HIGH (ref 70–99)
Glucose-Capillary: 119 mg/dL — ABNORMAL HIGH (ref 70–99)
Glucose-Capillary: 126 mg/dL — ABNORMAL HIGH (ref 70–99)
Glucose-Capillary: 138 mg/dL — ABNORMAL HIGH (ref 70–99)
Glucose-Capillary: 79 mg/dL (ref 70–99)
Glucose-Capillary: 98 mg/dL (ref 70–99)

## 2021-07-28 LAB — CBC
HCT: 32.6 % — ABNORMAL LOW (ref 39.0–52.0)
Hemoglobin: 10.3 g/dL — ABNORMAL LOW (ref 13.0–17.0)
MCH: 29.9 pg (ref 26.0–34.0)
MCHC: 31.6 g/dL (ref 30.0–36.0)
MCV: 94.5 fL (ref 80.0–100.0)
Platelets: 200 10*3/uL (ref 150–400)
RBC: 3.45 MIL/uL — ABNORMAL LOW (ref 4.22–5.81)
RDW: 13.3 % (ref 11.5–15.5)
WBC: 9 10*3/uL (ref 4.0–10.5)
nRBC: 0 % (ref 0.0–0.2)

## 2021-07-28 LAB — MAGNESIUM: Magnesium: 2.3 mg/dL (ref 1.7–2.4)

## 2021-07-28 NOTE — Progress Notes (Signed)
PROGRESS NOTE  ROSE HIPPLER IRW:[id number] DOB: Dec 22, 1930 DOA: 07/01/2021 PCP: Tonia Ghent, MD   LOS: 27 days   Brief Narrative / Interim history: 87 year old with past medical history significant for COPD, quit smoking 30 years ago, vocal cord cancer, diabetes who presents complaining of exertional dyspnea, reports sinusitis, postnasal drip, hoarseness for the past 3 to 4 months. He was also complaining of difficulty swallowing, food gets stuck in the throat, he also report phlegm, in his throat. CT maxillofacial, soft tissue neck: Asymmetric soft tissue density/fullness at the base of the right aryepiglottic fold, extending into the right piriform sinus with possible involvement of the right glottis inferiorly.  If echo to measure or define a discrete mass within this region given lack of IV contrast. ENT consulted for direct visualization.  Patient underwent  laryngoscopy with biopsy and tracheostomy 8/10.  received  PEG tube , tolerating tube feeds, cancer treatment with radiation therapy was offered following discharge.    Subjective Patient awake and alert this morning.  Denies any complaints.  Asking about further steps regarding his rehabilitation.     Assessment & Plan:  Acute Hypoxic Respiratory Failure, tracheostomy in place -Suspect related to component of aspiration due to dysphagia larynx CA, COPD exacerbation.   -He has completed 5 days of IV steroids  -Completed 7 days of cefepime with last day 8/21 -Respiratory culture with normal respiratory flora -ENT has been managing trach care -Patient is tolerating PM Valve most of the day, where his speech is fluent and very understandable. -Continue with scopolamine patch for secretions. Respiratory status remained stable.  He is on trach collar.  Saturations are in the 90s.  Signs bradycardia/pauses Noted to have sinus bradycardia on 9/3.  Patient was completely asymptomatic.  Noted to have pauses on telemetry but this is  a chronic.  Any AV blocking medications. Thyroid function tests are normal.  Electrolytes have been normal.  Bradycardia could have been due to excessive suctioning from a vagal response. Heart rate has been stable for the last 24 hours.  Continue to monitor on telemetry for now.  Squamous cell carcinoma Larynx -CT showed asymmetric soft tissue density fullness at the base of the right aryepiglottic fold. ENT was consulted. Patient underwent laryngoscopy with biopsy and tracheostomy 8/11. Stable on ATC 5 L oxygen.  -Biopsy showed squamous cell carcinoma with foci suspicious for invasion.  -Per ENT note when he was seen and evaluated initially, he was discussed at tumor board, will have outpatient follow-up with medical oncology, radiation oncology.  CT scan of the neck and chest showing that the pulmonary nodules have not progressed since 2019, therefore benign -Trach changed to Shiley 6 Prox XLT Cuffless.  Management per ENT Patient to be followed at cancer center eventually.  Severe protein calorie nutrition: Continue tube feedings via PEG.  Acute metabolic encephalopathy Had significant delirium initially but this appears to have resolved.  Seems to be back to baseline.  1.2 cm spiculated right upper lobe nodule-outpatient follow up   Left Renal mass -Concerning for renal carcinoma, will need to follow-up with urology as an outpatient.  These findings were communicated to patient's daughter by previous providers.   Diabetes mellitus type II, controlled HbA1c 5.8.  Monitor CBGs.  SSI.  Essential hypertension: Blood pressure stable.  Noted to be on amlodipine.  Normocytic anemia No evidence of overt bleeding.  Hemoglobin remained stable.   Leukocytosis probably related to steroids vs infection.  Resolved   DVT prophylaxis: On SCDs CODE  STATUS: DNR  Family Communication: No family at bedside. Disposition: Patient's insurance denied CIR.  Will likely need to go to SNF.  Status is:  Inpatient  Remains inpatient appropriate because:Inpatient level of care appropriate due to severity of illness  Dispo: The patient is from: Home              Anticipated d/c is to:  To be determined, most likely SNF              Patient currently is not medically stable to d/c.   Difficult to place patient No     Consultants:  ENT Trauma surgery for PEG Palliaitve  PCCM  Procedures:  Laryngoscopy / trach 2d echo IMPRESSIONS   1. Technically difficult study, very poor visualization of left ventricle even after contrast administration. Left ventricular ejection fraction, by estimation, is 55 to 60%. The left ventricle has grossly normal function but very poorly visualized. Left ventricular endocardial border not optimally defined to evaluate regional wall motion. Left ventricular diastolic parameters are indeterminate.   2. Right ventricular systolic function is normal. The right ventricular size is mildly enlarged. Tricuspid regurgitation signal is inadequate for assessing PA pressure.   3. Right atrial size was moderately dilated.   4. A small pericardial effusion is present. The pericardial effusion is anterior to the right ventricle. Presence of pericardial fat pad.   5. The mitral valve is grossly normal. No evidence of mitral valve regurgitation.   6. The aortic valve was not well visualized. Aortic valve regurgitation is not visualized. No aortic stenosis is present.   7. The inferior vena cava is normal in size with greater than 50% respiratory variability, suggesting right atrial pressure of 3 mmHg.   Microbiology  none  Antimicrobials: None Scheduled Meds:  acetaminophen  500 mg Per Tube TID   allopurinol  100 mg Oral Daily   amLODipine  5 mg Oral Daily   arformoterol  15 mcg Nebulization BID   budesonide (PULMICORT) nebulizer solution  0.25 mg Nebulization BID   chlorhexidine gluconate (MEDLINE KIT)  15 mL Mouth Rinse BID   Chlorhexidine Gluconate Cloth  6 each  Topical Daily   feeding supplement (PROSource TF)  45 mL Per Tube BID   free water  150 mL Per Tube Q4H   guaiFENesin  10 mL Oral Q8H   insulin aspart  0-9 Units Subcutaneous Q4H   lidocaine  1 application Urethral Once   mouth rinse  15 mL Mouth Rinse 10 times per day   nystatin  5 mL Oral QID   revefenacin  175 mcg Nebulization Daily   scopolamine  1 patch Transdermal Q72H   sennosides  5 mL Oral BID   sodium chloride flush  10-40 mL Intracatheter Q12H   Continuous Infusions:  feeding supplement (OSMOLITE 1.5 CAL) 1,000 mL (07/27/21 0434)   PRN Meds:.albuterol, bisacodyl, hydrALAZINE, lip balm, LORazepam, sodium chloride flush, traMADol  Diet Orders (From admission, onward)     Start     Ordered   07/03/21 1433  Diet NPO time specified Except for: Ice Chips  Diet effective now       Comments: Occasional ice chips after oral care  Question:  Except for  Answer:  Ice Chips   07/03/21 1432               Objective: Vitals:   07/28/21 0104 07/28/21 0300 07/28/21 0439 07/28/21 0800  BP:  (!) 117/52  111/69  Pulse: 60  71 69  Resp: 18  (!) 23 17  Temp:  97.9 F (36.6 C)  97.6 F (36.4 C)  TempSrc:  Oral  Oral  SpO2: 99%  99% 95%  Weight:  71.2 kg    Height:        Intake/Output Summary (Last 24 hours) at 07/28/2021 1119 Last data filed at 07/28/2021 0816 Gross per 24 hour  Intake --  Output 2600 ml  Net -2600 ml    Filed Weights   07/26/21 0343 07/27/21 0334 07/28/21 0300  Weight: 74.8 kg 73.5 kg 71.2 kg    Examination:  Patient is awake alert.  In no distress Tracheostomy is noted Coarse breath sounds bilaterally with few crackles at the bases.  No wheezing or rhonchi S1-S2 is normal regular. Abdomen is soft.  PEG tube is noted Telemetry shows sinus rhythm in the 60s to 70s.  Occasional pauses noted.   Data Reviewed: I have independently reviewed following labs and imaging studies   CBC: Recent Labs  Lab 07/22/21 0213 07/23/21 0103 07/28/21 0142   WBC 6.9 7.2 9.0  HGB 10.0* 10.2* 10.3*  HCT 32.0* 32.2* 32.6*  MCV 95.5 95.0 94.5  PLT 175 182 825    Basic Metabolic Panel: Recent Labs  Lab 07/22/21 0213 07/23/21 0103 07/28/21 0142  NA 137 138 136  K 4.4 4.3 4.5  CL 101 102 100  CO2 _0 GLUCOSE 167* 123* 147*  BUN 20 24* 31*  CREATININE 0.65 0.69 0.75  CALCIUM 9.4 9.3 9.5  MG  --  2.2 2.3    CBG: Recent Labs  Lab 07/27/21 1552 07/27/21 1935 07/28/21 0000 07/28/21 0340 07/28/21 0747  GLUCAP 107* 121* 126* 119* 138*      Radiology Studies: No results found.   Bonnielee Haff  [facility] Pager: [email or web address]

## 2021-07-28 NOTE — Progress Notes (Signed)
Physical Therapy Treatment Patient Details Name: Christopher Santiago MRN: [id number] DOB: Jun 23, 1931 Today's Date: 07/28/2021    History of Present Illness 87 y/o male admitted 8/9 secondary to worsening SOB, especially with exertion. Found to have acute respiratory failure with hypoxia. Imaging showed fullness of the right aryepiglottic fold extending into the right pyriform and L renal mass. Pt found to have left renal mass 8/9. Pt had Flexible laryngoscopy on 8/10 with trach. PEG 8/23. Acute course complicated by metabolic encephalopathy. PMHx:vocal cord cancer, COPD, CAD, DM, HTN, and R THA, gout, PVD, BPH.    PT Comments    Pt tolerated treatment well with VSS throughout on RA. Pt demonstrated increased ambulation distance with decreased assist compared to previous session. 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 Pt and spouse would benefit from SNF or long term assisted living facility given decreased support at home. Updated goals based on pt's progression.   Follow Up Recommendations  SNF     Equipment Recommendations  Rolling walker with 5" wheels    Recommendations for Other Services       Precautions / Restrictions Precautions Precautions: Fall Precaution Comments: trach, peg Restrictions Weight Bearing Restrictions: No    Mobility  Bed Mobility Overal bed mobility: Needs Assistance Bed Mobility: Supine to Sit     Supine to sit: Supervision;HOB elevated     General bed mobility comments: HOB elevated to 33 degrees. Supervision with slightly increased timing and use of bedrails    Transfers Overall transfer level: Needs assistance Equipment used: Rolling walker (2 wheeled) Transfers: Sit to/from Stand Sit to Stand: Min guard         General transfer comment: Sit to stand x2 from bed. Min guard for safety. Performed with increased time. Verbal cues for hand placement on bed and  RW.  Ambulation/Gait Ambulation/Gait assistance: Min guard Gait Distance (Feet): 150 Feet Assistive device: Rolling walker (2 wheeled) Gait Pattern/deviations: Step-through pattern Gait velocity: Decreased Gait velocity interpretation: 1.31 - 2.62 ft/sec, indicative of limited community ambulator General Gait Details: Demonstrated great recall of postural cues and ambulated without seated rest   Stairs             Wheelchair Mobility    Modified Rankin (Stroke Patients Only)       Balance Overall balance assessment: Needs assistance Sitting-balance support: No upper extremity supported;Feet supported Sitting balance-Leahy Scale: Fair     Standing balance support: Bilateral upper extremity supported;During functional activity Standing balance-Leahy Scale: Poor Standing balance comment: Relies on RW                            Cognition Arousal/Alertness: Awake/alert Behavior During Therapy: WFL for tasks assessed/performed Overall Cognitive Status: Within Functional Limits for tasks assessed                                 General Comments: Pt stated he feels a little "foggy"      Exercises General Exercises - Lower Extremity Long Arc Quad: AROM;Both;20 reps;Seated Hip Flexion/Marching: AROM;Both;Seated;20 reps    General Comments General comments (skin integrity, edema, etc.): VSS on RA. spO2 88-100% with inaccurate sats due to grip on RW. HR 83-108      Pertinent Vitals/Pain Pain Assessment: No/denies pain    Home Living  Prior Function            PT Goals (current goals can now be found in the care plan section) Acute Rehab PT Goals Patient Stated Goal: To go to SNF Time For Goal Achievement: 08/11/21 Potential to Achieve Goals: Good Progress towards PT goals: Progressing toward goals    Frequency    Min 3X/week      PT Plan Discharge plan needs to be updated    Co-evaluation               AM-PAC PT "6 Clicks" Mobility   Outcome Measure  Help needed turning from your back to your side while in a flat bed without using bedrails?: A Little Help needed moving from lying on your back to sitting on the side of a flat bed without using bedrails?: A Little Help needed moving to and from a bed to a chair (including a wheelchair)?: A Little Help needed standing up from a chair using your arms (e.g., wheelchair or bedside chair)?: A Little Help needed to walk in hospital room?: A Little Help needed climbing 3-5 steps with a railing? : A Lot 6 Click Score: 17    End of Session Equipment Utilized During Treatment: Gait belt Activity Tolerance: Patient tolerated treatment well Patient left: with call bell/phone within reach;in chair;with chair alarm set Nurse Communication: Mobility status PT Visit Diagnosis: Unsteadiness on feet (R26.81);Other abnormalities of gait and mobility (R26.89)     Time: 1610-9604 PT Time Calculation (min) (ACUTE ONLY): 33 min  Charges:  $Gait Training: 8-22 mins $Therapeutic Activity: 8-22 mins                     Louie Casa, SPT Acute Rehab: [phone]    Domingo Dimes 07/28/2021, 9:46 AM

## 2021-07-28 NOTE — Progress Notes (Signed)
Occupational Therapy Treatment Patient Details Name: Christopher Santiago MRN: [id number] DOB: 1931/07/17 Today's Date: 07/28/2021    History of present illness 87 y/o male admitted 8/9 secondary to worsening SOB, especially with exertion. Found to have acute respiratory failure with hypoxia. Imaging showed fullness of the right aryepiglottic fold extending into the right pyriform and L renal mass. Pt found to have left renal mass 8/9. Pt had Flexible laryngoscopy on 8/10 with trach. PEG 8/23. Acute course complicated by metabolic encephalopathy. PMHx:vocal cord cancer, COPD, CAD, DM, HTN, and R THA, gout, PVD, BPH.   OT comments  Patient seen by skilled to address grooming standing at sink, toilet transfers, and HEP review.  Patient was supervision to get to eob and min guard for grooming at sink and toilet tranfers with vcs for safety and hand placement for sit to stands.  Reviewed HEP with fewer cues.  Acute OT to continue to follow.   Follow Up Recommendations  CIR    Equipment Recommendations  Other (comment)    Recommendations for Other Services Rehab consult    Precautions / Restrictions Precautions Precautions: Fall Precaution Comments: trach, peg       Mobility Bed Mobility Overal bed mobility: Needs Assistance Bed Mobility: Supine to Sit;Sit to Supine     Supine to sit: Supervision;HOB elevated Sit to supine: Supervision;HOB elevated   General bed mobility comments: cluttered bed causing increased difficulty with bed mobility.    Transfers Overall transfer level: Needs assistance Equipment used: Rolling walker (2 wheeled) Transfers: Sit to/from Stand Sit to Stand: Min guard Stand pivot transfers: Min guard       General transfer comment: toilet transfer to [facility] with cues for hand placement    Balance Overall balance assessment: Needs assistance Sitting-balance support: No upper extremity supported;Feet supported Sitting balance-Leahy Scale: Fair Sitting balance -  Comments: Sat on EOB to review HEP for 10-15 minutes   Standing balance support: No upper extremity supported;During functional activity Standing balance-Leahy Scale: Poor Standing balance comment: Relies on RW for mobilty                           ADL either performed or assessed with clinical judgement   ADL Overall ADL's : Needs assistance/impaired     Grooming: Wash/dry hands;Wash/dry face;Min guard;Standing Grooming Details (indicate cue type and reason): min guard for safety, performed 2 handed tasks                 Toilet Transfer: RW;Min Psychiatric nurse Details (indicate cue type and reason): transfer to [facility]         Functional mobility during ADLs: Min guard;Rolling walker General ADL Comments: vcs for hand placement to stand from [place]: Awake/alert Behavior During Therapy: WFL for tasks assessed/performed Overall Cognitive Status: Within Functional Limits for tasks assessed                                          Exercises Exercises: General Upper Extremity General Exercises - Upper Extremity Shoulder Flexion: Strengthening;10 reps;Theraband Theraband Level (Shoulder Flexion): Level 2 (Red) Shoulder ABduction: Strengthening;10 reps;Both;Theraband Theraband Level (Shoulder Abduction): Level 2 (Red) Elbow Flexion: Strengthening;10 reps;Seated;Theraband Theraband Level (Elbow Flexion): Level 2 (Red) Elbow Extension: Strengthening;10  reps;Seated;Theraband Theraband Level (Elbow Extension): Level 2 (Red)   Shoulder Instructions       General Comments      Pertinent Vitals/ Pain       Pain Assessment: No/denies pain  Home Living                                          Prior Functioning/Environment              Frequency  Min 2X/week        Progress Toward Goals  OT Goals(current goals can now be found in the  care plan section)  Progress towards OT goals: Progressing toward goals  Acute Rehab OT Goals Patient Stated Goal: To go to SNF OT Goal Formulation: With patient Time For Goal Achievement: 07/31/21 Potential to Achieve Goals: Good ADL Goals Pt Will Perform Eating: Independently Pt Will Perform Grooming: with modified independence;standing Pt Will Perform Upper Body Dressing: Independently Pt Will Perform Lower Body Dressing: with modified independence;sit to/from stand Pt Will Transfer to Toilet: with modified independence;ambulating Pt Will Perform Toileting - Clothing Manipulation and hygiene: with modified independence;sit to/from stand Pt Will Perform Tub/Shower Transfer: Shower transfer;Tub transfer;rolling walker;shower seat Pt/caregiver will Perform Home Exercise Program: Increased ROM;Increased strength;Both right and left upper extremity  Plan Discharge plan remains appropriate;Frequency remains appropriate    Co-evaluation                 AM-PAC OT "6 Clicks" Daily Activity     Outcome Measure   Help from another person eating meals?: Total Help from another person taking care of personal grooming?: A Little Help from another person toileting, which includes using toliet, bedpan, or urinal?: A Little Help from another person bathing (including washing, rinsing, drying)?: A Little Help from another person to put on and taking off regular upper body clothing?: A Little Help from another person to put on and taking off regular lower body clothing?: A Little 6 Click Score: 16    End of Session Equipment Utilized During Treatment: Oxygen;Rolling walker  OT Visit Diagnosis: Unsteadiness on feet (R26.81);Muscle weakness (generalized) (M62.81)   Activity Tolerance Patient tolerated treatment well   Patient Left in bed;with call bell/phone within reach   Nurse Communication Mobility status        Time: 1323-1350 OT Time Calculation (min): 27 min  Charges: OT  General Charges $OT Visit: 1 Visit OT Treatments $Self Care/Home Management : 8-22 mins $Therapeutic Exercise: 8-22 mins  Lodema Hong, OTA    Christopher Santiago 07/28/2021, 2:21 PM

## 2021-07-28 NOTE — Progress Notes (Signed)
Received a consult to see this patient This patient is currently up in the chair and not requiring IV medications at this time. Instructed the nurse to re-consult [facility] when patient is back in bed. VU. Fran Lowes, RN [facility]

## 2021-07-28 NOTE — TOC Progression Note (Signed)
Transition of Care [facility]) - Progression Note    Patient Details  Name: Christopher Santiago MRN: [id number] Date of Birth: 1930/12/22  Transition of Care [facility] CM/SW Contact  [name], LCSW Phone Number: 07/28/2021, 12:26 PM  Clinical Narrative:  CSW spoke with pt regarding insurance denial for CIR.  Pt aware, stating he would like to be DC home rather than to SNF.  CSW discussed with MD at progression, home not option based on new trach.  CSW again spoke with pt and informed him, pt agreeable to "do what I have to do."  CSW spoke with [name] and updated her as well.  She has questions related to treatment vs no treatment options for the cancer, MD informed.      Expected Discharge Plan: [place] Barriers to Discharge: Continued Medical Work up, SNF Pending bed offer, Other (must enter comment) (pt has new trach placed on 07/02/21)  Expected Discharge Plan and Services Expected Discharge Plan: [place] Choice: [place] arrangements for the past 2 months: Single Family Home                                       Social Determinants of Health (SDOH) Interventions    Readmission Risk Interventions No flowsheet data found.

## 2021-07-29 ENCOUNTER — Inpatient Hospital Stay (HOSPITAL_COMMUNITY): Payer: Medicare Other

## 2021-07-29 DIAGNOSIS — R0600 Dyspnea, unspecified: Secondary | ICD-10-CM

## 2021-07-29 DIAGNOSIS — R001 Bradycardia, unspecified: Secondary | ICD-10-CM | POA: Diagnosis not present

## 2021-07-29 LAB — GLUCOSE, CAPILLARY
Glucose-Capillary: 129 mg/dL — ABNORMAL HIGH (ref 70–99)
Glucose-Capillary: 131 mg/dL — ABNORMAL HIGH (ref 70–99)
Glucose-Capillary: 134 mg/dL — ABNORMAL HIGH (ref 70–99)
Glucose-Capillary: 135 mg/dL — ABNORMAL HIGH (ref 70–99)
Glucose-Capillary: 141 mg/dL — ABNORMAL HIGH (ref 70–99)

## 2021-07-29 NOTE — Plan of Care (Signed)
  Problem: Clinical Measurements: Goal: Respiratory complications will improve Outcome: Progressing  Will continue to monitor oxygen saturation and suction and tx anxiety as needed.

## 2021-07-29 NOTE — Progress Notes (Signed)
Radiation Oncology         [phone] ________________________________  Initial Inpatient Consultation  Name: Christopher Santiago MRN: [id number]  Date: 07/30/2021  DOB: Dec 15, 1930  MA:UQJFHL, Elveria Rising, MD  Skotnicki, Meghan A, DO   REFERRING PHYSICIAN: Skotnicki, Meghan A, DO  DIAGNOSIS:    ICD-10-CM   1. Malignant neoplasm of supraglottis ([place])  C32.1      Cancer Staging Malignant neoplasm of supraglottis ([place]) Staging form: Larynx - Supraglottis, AJCC 8th Edition - Clinical stage from 07/09/2021: Stage III (cT3, cN0, cM0) - Signed by Eppie Gibson, MD on 07/30/2021 Stage prefix: Initial diagnosis  Squamous cell carcinoma Larynx  CHIEF COMPLAINT: Here to discuss management of laryngeal cancer  HISTORY OF PRESENT ILLNESS::Christopher Santiago is a 87 y.o. male who presented to the ED on 07/01/21 with exertional dyspnea and associated sinusitis, post nasal drip, hoarseness for 3-4 months. Patient reported upon arrival that DOE and SOB began mainly in the 24 hours prior to his ED encounter, and gradually worsened. He was also reported difficulty swallowing with food getting stuck in the throat, and phlegm. CT of the chest taken during ED course revealed severe emphysema, mucous plugging, and bronchitis of LLL. His symptoms improved somewhat after solumedrol and nebulizer treatments but still required supplemental O2 via nasal cannula (2L). The patient was accordingly admitted and has remained inpatient since.  Soft tissue neck & maxillofacial CT performed on 07/02/21 revealed asymmetric soft tissue density/fullness at the base of the right aryepiglottic fold, extending into the right piriformis sinus, with possible involvement noted of the right glottis inferiorly. An indeterminate 1.2 cm spiculated right upper lobe nodule, with a few additional pleural based nodules at the posterior right upper lobe were visualized as well.  Subsequently, the patient was referred to Dr. Fredric Dine for laryngoscopy  w/ biopsy and tracheostomy on 07/02/21. Biopsy of right arytenoid mass on 07/02/21 revealed: squamous cell carcinoma with foci suspicious for invasion  Soft tissue neck CT performed on 07/10/21 demonstrated the enhancing lesion in the region of the right arytenoid cartilage measuring approximately 2.5 cm x 1.4 cm; corresponding to the known malignancy. Involvement of the right aryepiglottic fold with effacement of the right piriform sinus was additionally seen, as well as involvement of the right pre epiglottic and paraglottic fat. Mild hyperenhancement of the right true vocal fold also seen was noted as suspicious for involvement (although no vocal cord abnormalities were seen on endoscopy). Chest CT taken on this same date indicated the previously seen pulmonary nodules as likely benign.  The patient underwent EGD and PEG tube placement on 07/15/21.  Swallowing issues, if any: Trach. Swallow study conducted on 07/04/21 revealed the patient to have severe aspiration risk.  Weight Changes: Loss of weight due to difficulty swallowing  Other symptoms: Initially presented to the ED on 07/01/21 hoarseness, prolonged expiratory phase, not speaking in complete sentences, scattered wheezes.  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  No chemotherapy or radiation  He retired a few decades ago.  He lives in [place].  His wife has her own health problems and the patient's daughter reports that he lives to take care of his wife.  It has been  difficult for him to contribute to caring for her given his own medical issues lately.  Before he started having symptoms from his cancer he  also had issues  with his cognition and emphysema and overall stamina.  The patient reports that he has lived a great life and now that he is 87 years old he is prepared to die.   He is not interested in prolonging life.  He reports that his quality of life has declined from his comorbidities and he does not want to grow older with diminished quality of life moving forward, even if his cancer goes away. He does not want to experience side effects from aggressive treatments.  The patient's daughter and wife validate and support his statements.  Patient and family are aware of a kidney mass that has not been fully worked up from his initial imaging.  This has not been biopsied  PREVIOUS RADIATION THERAPY: No  PAST MEDICAL HISTORY:  has a past medical history of Adrenal adenoma, right, 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/2016), Anemia, Aortic atherosclerosis ([place]), Ascending aortic aneurysm ([place]), Benign prostatic hypertrophy, Bradycardia, CAD (coronary artery disease), Cancer ([place]), COPD (chronic obstructive pulmonary disease) ([place]), Depression, Diabetes mellitus, Diverticulosis, sigmoid, Dizziness, Duodenal ulcer with hemorrhage (02/2020), DVT (deep venous thrombosis) ([place]), Dyspnea, Fatty liver, Gait instability, Grade I diastolic dysfunction (40/98/1191), History of colon polyps, History of inguinal hernia, History of kidney stones, Hyperlipidemia, Hypertension, Hypogonadism male, Incomplete RBBB (09/22/2018), Left anterior fascicular block (09/22/2018), Lower urinary tract symptoms (LUTS), Lung nodules, LVH (left ventricular hypertrophy) (02/11/2018), MR (mitral regurgitation) (02/11/2018), Multiple contusions, Multiple gastric ulcers (02/2020), Nasal drainage, Nocturia, OA (osteoarthritis), OSA (obstructive sleep apnea), PE (pulmonary embolism) (10/2014), Peripheral vascular disease ([place]), Persistent cough, Pneumonia, Pulmonary hypertension ([place]), and Trimalleolar fracture.    PAST SURGICAL HISTORY: Past Surgical History:   Procedure Laterality Date   ANKLE FRACTURE SURGERY Left    unsure if there is metal in the ankle   BIOPSY  03/02/2020   Procedure: BIOPSY;  Surgeon: Yetta Flock, MD;  Location: [facility];  Service: Gastroenterology;;   CARDIAC CATHETERIZATION  October 11, 2005   dead spot, two small occlusions , EF 50-55-55% mild -Mod Dz    CATARACT EXTRACTION W/ INTRAOCULAR LENS  IMPLANT, BILATERAL     COLONOSCOPY W/ POLYPECTOMY  04/20/2010   CYSTOSCOPY WITH URETHRAL DILATATION N/A 11/10/2018   Procedure: CYSTOSCOPY WITH URETHRAL DILATATION;  Surgeon: Irine Seal, MD;  Location: [facility];  Service: Urology;  Laterality: N/A;   DIRECT LARYNGOSCOPY N/A 07/02/2021   Procedure: DIRECT LARYNGOSCOPY WITH BIOPSY;  Surgeon: Jason Coop, DO;  Location: [facility];  Service: ENT;  Laterality: N/A;   ESOPHAGOGASTRODUODENOSCOPY (EGD) WITH PROPOFOL N/A 03/02/2020   Procedure: ESOPHAGOGASTRODUODENOSCOPY (EGD) WITH PROPOFOL;  Surgeon: Yetta Flock, MD;  Location: [facility];  Service: Gastroenterology;  Laterality: N/A;   HERNIA REPAIR  07/04/01   right side inguinal   HOT HEMOSTASIS N/A 03/02/2020   Procedure: HOT HEMOSTASIS (ARGON PLASMA COAGULATION/BICAP);  Surgeon: Yetta Flock, MD;  Location: [facility];  Service: Gastroenterology;  Laterality: N/A;   LARYNGOSCOPY     with biopsy, right vocal cord lesion    OMENTECTOMY     age 45   PEG PLACEMENT N/A 07/15/2021   Procedure: PERCUTANEOUS ENDOSCOPIC GASTROSTOMY (PEG) PLACEMENT,;  Surgeon: Jesusita Oka, MD;  Location: [facility];  Service: General;  Laterality: N/A;   PROSTATE SURGERY     not full excision   SCLEROTHERAPY  03/02/2020   Procedure: Clide Deutscher;  Surgeon: Yetta Flock, MD;  Location: [facility];  Service: Gastroenterology;;   TOTAL HIP ARTHROPLASTY Right 04/11/2018   Procedure: TOTAL HIP ARTHROPLASTY ANTERIOR APPROACH;  Surgeon: Lovell Sheehan, MD;  Location: [facility];  Service: Orthopedics;  Laterality: Right;    TRACHEOSTOMY TUBE PLACEMENT N/A 07/02/2021   Procedure: TRACHEOSTOMY;  Surgeon: Skotnicki, Meghan A, DO;  Location: [facility];  Service: ENT;  Laterality: N/A;   TRANSURETHRAL RESECTION OF PROSTATE N/A 11/10/2018   Procedure: TRANSURETHRAL RESECTION OF THE PROSTATE (TURP);  Surgeon: Irine Seal, MD;  Location: [facility];  Service: Urology;  Laterality: N/A;   VENA CAVA FILTER PLACEMENT  2015    FAMILY HISTORY: family history includes Diabetes in his mother; Hypertension in his mother.  SOCIAL HISTORY:  reports that he quit smoking about 27 years ago. His smoking use included cigarettes. He has a 79.50 pack-year smoking history. He has never used smokeless tobacco. He reports current alcohol use of about 1.0 - 2.0 standard drink per week. He reports that he does not use drugs.  ALLERGIES: Tessalon [benzonatate]  MEDICATIONS:  No current facility-administered medications for this encounter.   No current outpatient medications on file.   Facility-Administered Medications Ordered in Other Encounters  Medication Dose Route Frequency Provider Last Rate Last Admin   acetaminophen (TYLENOL) tablet 500 mg  500 mg Per Tube TID Norm Parcel, PA-C   500 mg at 07/30/21 1712   albuterol (PROVENTIL) (2.5 MG/3ML) 0.083% nebulizer solution 2.5 mg  2.5 mg Nebulization Q2H PRN Norm Parcel, PA-C       allopurinol (ZYLOPRIM) tablet 100 mg  100 mg Oral Daily Norm Parcel, PA-C   100 mg at 07/30/21 0809   amLODipine (NORVASC) tablet 5 mg  5 mg Oral Daily Norm Parcel, PA-C   5 mg at 07/27/21 0957   arformoterol (BROVANA) nebulizer solution 15 mcg  15 mcg Nebulization BID Norm Parcel, PA-C   15 mcg at 07/30/21 0825   bisacodyl (DULCOLAX) suppository 10 mg  10 mg Rectal Daily PRN Norm Parcel, PA-C       budesonide (PULMICORT) nebulizer solution 0.25 mg  0.25 mg Nebulization BID Norm Parcel, PA-C   0.25 mg at 07/30/21 0825   chlorhexidine gluconate (MEDLINE KIT) (PERIDEX) 0.12 % solution 15  mL  15 mL Mouth Rinse BID Norm Parcel, PA-C   15 mL at 07/30/21 0810   Chlorhexidine Gluconate Cloth 2 % PADS 6 each  6 each Topical Daily Norm Parcel, PA-C   6 each at 07/30/21 4008   feeding supplement (OSMOLITE 1.5 CAL) liquid 1,000 mL  1,000 mL Per Tube Continuous Norm Parcel, PA-C 55 mL/hr at 07/29/21 2159 1,000 mL at 07/29/21 2159   feeding supplement (PROSource TF) liquid 45 mL  45 mL Per Tube BID Norm Parcel, PA-C   45 mL at 07/30/21 0809   free water 150 mL  150 mL Per Tube Q4H Barkley Boards R, PA-C   150 mL at 07/30/21 1712   guaiFENesin (ROBITUSSIN) 100 MG/5ML solution 200 mg  10 mL Oral Q8H Barkley Boards R, PA-C   200 mg at 07/30/21 1711   hydrALAZINE (APRESOLINE) injection 10 mg  10 mg Intravenous Q6H PRN Barkley Boards R, PA-C   10 mg at 07/15/21 1618   insulin aspart (novoLOG) injection 0-9 Units  0-9 Units Subcutaneous Q4H Elgergawy, Silver Huguenin, MD   1 Units at 07/30/21 0808   lidocaine (XYLOCAINE) 2 % jelly 1 application  1 application Urethral Once  Barkley Boards R, PA-C       lip balm (CARMEX) ointment   Topical PRN Norm Parcel, PA-C       LORazepam (ATIVAN) injection 0.25 mg  0.25 mg Intravenous Q4H PRN Norm Parcel, PA-C   0.25 mg at 07/29/21 0257   MEDLINE mouth rinse  15 mL Mouth Rinse 10 times per day Norm Parcel, PA-C   15 mL at 07/30/21 1711   nystatin (MYCOSTATIN) 100000 UNIT/ML suspension 500,000 Units  5 mL Oral QID Norm Parcel, PA-C   500,000 Units at 07/30/21 1710   revefenacin (YUPELRI) nebulizer solution 175 mcg  175 mcg Nebulization Daily Norm Parcel, PA-C   175 mcg at 07/30/21 0825   scopolamine (TRANSDERM-SCOP) 1 MG/3DAYS 1.5 mg  1 patch Transdermal Q72H Barkley Boards R, PA-C   1.5 mg at 07/28/21 2224   sennosides (SENOKOT) 8.8 MG/5ML syrup 5 mL  5 mL Oral BID Norm Parcel, PA-C   5 mL at 07/30/21 8850   sodium chloride flush (NS) 0.9 % injection 10-40 mL  10-40 mL Intracatheter Q12H Norm Parcel, PA-C   10 mL at  07/30/21 2774   sodium chloride flush (NS) 0.9 % injection 10-40 mL  10-40 mL Intracatheter PRN Norm Parcel, PA-C   10 mL at 07/26/21 2132   traMADol (ULTRAM) tablet 25 mg  25 mg Oral Q6H PRN Norm Parcel, PA-C   25 mg at 07/10/21 0116    REVIEW OF SYSTEMS:  Notable for that above.   PHYSICAL EXAM:  vitals were not taken for this visit.   General: Alert and oriented to year but not month; oriented to hospital but not city; oriented to self, in no acute distress HEENT: Head is normocephalic. Extraocular movements are intact. Oropharynx is notable for no lesions in upper throat and mouth. Neck: Neck is notable for no palpable adenopathy.  Trach in place with secretions Heart: Regular in rate and rhythm with no murmurs, rubs, or gallops. Chest: Upper airway noises transmitted through chest Abdomen: Soft, nontender, nondistended, with no rigidity or guarding. Extremities: No cyanosis or edema. Lymphatics: Santiago Neck Exam Psych: He shows occasional difficulty with providing his medical history but overall is engaged, pleasant to speak with, able to articulate his wishes.  Santiago general exam  ECOG = 3  0 - Asymptomatic (Fully active, able to carry on all predisease activities without restriction)  1 - Symptomatic but completely ambulatory (Restricted in physically strenuous activity but ambulatory and able to carry out work of a light or sedentary nature. For example, light housework, office work)  2 - Symptomatic, <50% in bed during the day (Ambulatory and capable of all self care but unable to carry out any work activities. Up and about more than 50% of waking hours)  3 - Symptomatic, >50% in bed, but not bedbound (Capable of only limited self-care, confined to bed or chair 50% or more of waking hours)  4 - Bedbound (Completely disabled. Cannot carry on any self-care. Totally confined to bed or chair)  5 - Death   Eustace Pen MM, Creech RH, Tormey DC, et al. [phone]). "Toxicity and response  criteria of the [facility] Group". [place]. 5 (6): 649-55   LABORATORY DATA:  Lab Results  Component Value Date   WBC 9.0 07/28/2021   HGB 10.3 (L) 07/28/2021   HCT 32.6 (L) 07/28/2021   MCV 94.5 07/28/2021   PLT 200 07/28/2021   CMP  Component Value Date/Time   NA 136 07/28/2021 0142   NA 145 (H) 01/25/2018 1548   NA 136 11/09/2014 0440   K 4.5 07/28/2021 0142   K 4.6 11/09/2014 0440   CL 100 07/28/2021 0142   CL 101 11/09/2014 0440   CO2 28 07/28/2021 0142   CO2 30 11/09/2014 0440   GLUCOSE 147 (H) 07/28/2021 0142   GLUCOSE 135 (H) 11/09/2014 0440   BUN 31 (H) 07/28/2021 0142   BUN 28 (H) 01/25/2018 1548   BUN 53 (H) 11/09/2014 0440   CREATININE 0.75 07/28/2021 0142   CREATININE 1.16 12/03/2014 1306   CALCIUM 9.5 07/28/2021 0142   CALCIUM 9.2 11/09/2014 0440   PROT 5.7 (L) 07/16/2021 0331   PROT 7.6 11/06/2014 0414   ALBUMIN 2.2 (L) 07/16/2021 0331   ALBUMIN 3.2 (L) 11/06/2014 0414   AST 14 (L) 07/16/2021 0331   AST 18 11/06/2014 0414   ALT 12 07/16/2021 0331   ALT 25 11/06/2014 0414   ALKPHOS 42 07/16/2021 0331   ALKPHOS 46 11/06/2014 0414   BILITOT 0.9 07/16/2021 0331   BILITOT 0.7 11/06/2014 0414   GFRNONAA >60 07/28/2021 0142   GFRNONAA >60 12/03/2014 1306   GFRAA >60 03/06/2020 0443   GFRAA >60 12/03/2014 1306      Lab Results  Component Value Date   TSH 1.991 07/26/2021     RADIOGRAPHY: CT SOFT TISSUE NECK WO CONTRAST  Result Date: 07/02/2021 CLINICAL DATA:  Initial evaluation for chronic sinusitis, history of vocal cord cancer. EXAM: CT MAXILLOFACIAL WITHOUT CONTRAST CT NECK WITHOUT CONTRAST TECHNIQUE: Multidetector CT imaging of the neck was performed following the standard protocol without intravenous contrast. COMPARISON:  None available. FINDINGS: Pharynx and larynx: Visualized oral cavity within normal limits. No visible acute abnormality about the dentition. Oropharynx and nasopharynx within normal limits. No  retropharyngeal collection or swelling. Epiglottis normal. Vallecula clear. There is asymmetric soft tissue density/fullness at the base of the right aryepiglottic fold extending into the right piriformis sinus (series 3, image 61). Difficult to measure or define a discrete mass within this region given lack of IV contrast. Possible involvement of the right false cord and laryngeal ventricle. Asymmetric sclerosis with medialization of the right arytenoid cartilage and right true cord, which also may be involved (series 3, image 66). Asymmetric infiltration within the right paraglottic fat (series 3, image 62) no definite erosive changes of the adjacent thyroid cartilage. Contralateral left aryepiglottic fold within normal limits. Left piriform sinus clear. Subglottic airway patent clear. Salivary glands: Apparent 1.1 cm soft tissue density lesion at the superior aspect of the right parotid gland noted (series 3, image 13). Finding is closely approximated to adjacent vascular structures, and could reflect a portion of a non-opacified vein, not well assessed on this noncontrast examination. There is an additional 1.2 cm lesion with central calcification along the posterior margin of the right parotid gland inferiorly (series 3, image 30), indeterminate. Left parotid gland unremarkable. Submandibular glands within normal limits. Thyroid: Normal. Lymph nodes: No enlarged or pathologic adenopathy seen on this noncontrast examination. Vascular: Moderate atheromatous change about the visualized aortic arch and proximal great vessels, carotid bifurcations, and skull base. Limited intracranial: Age-related cerebral atrophy. Otherwise unremarkable. Visualized orbits: Prior bilateral ocular lens replacement. Globes and orbital soft tissues demonstrate no acute finding. Mastoids and visualized paranasal sinuses: Chronic mucosal thickening noted about the left frontal sinus, with scattered mucoperiosteal thickening about the  ethmoidal air cells bilaterally. Paranasal sinuses are otherwise clear. No air-fluid levels to  suggest acute sinusitis. Mastoid air cells and middle ear cavities are well pneumatized and free of fluid. Skeleton: No visible acute osseous finding. No discrete or worrisome osseous lesions. Mild chronic compression deformity noted involving the T4 vertebral body. Moderate spondylosis noted at C6-7. Upper chest: Emphysematous changes noted within the visualized lungs. 1.2 cm spiculated nodular density with associated architectural distortion present at the right upper lobe, indeterminate. Few additional pleural base nodular densities measuring 7 mm (series 4, image 39) and 9 mm (series 4, image 26) seen at the posterior 0 medial right upper lobe. Visualized upper chest demonstrates no other acute finding. Other: None. IMPRESSION: 1. Asymmetric soft tissue density/fullness at the base of the right aryepiglottic fold extending into the right piriformis sinus, with possible involvement of the right glottis inferiorly as above. Difficult to measure or define a discrete mass within this region given lack of IV contrast. While this finding could reflect sequelae of prior treatment, possible locally recurrent tumor could also have this appearance. Correlation with direct visualization recommended. No adenopathy. 2. 1.2 cm spiculated right upper lobe nodule, with a few additional pleural based nodules at the posterior right upper lobe as above. Findings are indeterminate. Consider one of the following in 3 months for both low-risk and high-risk individuals: (a) repeat chest CT, (b) follow-up PET-CT, or (c) tissue sampling. This recommendation follows the consensus statement: Guidelines for Management of Incidental Pulmonary Nodules Detected on CT Images: From the Fleischner Society 2017; Radiology 2017; 284:228-243. 3. Mild chronic frontoethmoidal sinusitis. No CT evidence for active disease at this time. 4. Two small  approximate 1 cm soft tissue density lesions involving the right parotid gland as above, indeterminate. Nonemergent outpatient ENT referral for further workup and evaluation suggested. 5. Aortic Atherosclerosis (ICD10-I70.0) and Emphysema (ICD10-J43.9). Electronically Signed   By: Jeannine Boga M.D.   On: 07/02/2021 03:11   CT SOFT TISSUE NECK W CONTRAST  Result Date: 07/10/2021 CLINICAL DATA:  Thickening of right aryepiglottic fold seen on recent CT neck, ENT consult it, patient underwent laryngoscopy with biopsy and tracheostomy 810. EXAM: CT NECK WITH CONTRAST TECHNIQUE: Multidetector CT imaging of the neck was performed using the standard protocol following the bolus administration of intravenous contrast. CONTRAST:  1mL OMNIPAQUE IOHEXOL 300 MG/ML  SOLN COMPARISON:  Noncontrast CT neck 07/02/2021, cervical spine CT 06/29/2019 FINDINGS: Pharynx and larynx: There is soft tissue thickening in the region of the right arytenoid corresponding to the abnormality seen on recent endoscopy. The abnormality measures approximately 2.5 cm by 1.4 cm in the axial plane (3-61). There is effacement of the right pre epiglottic and paraglottic fat. There is thickening of the right aryepiglottic fold with effacement of the piriform sinus (3-55). There is mild hyperenhancement of the right true vocal fold. There is no definite involvement of the false fold or evidence of subglottic extension. The lesion abuts the thyroid cartilage without definite evidence of invasion. There is no evidence of extra laryngeal extension. The oropharynx, oral cavity, and nasopharynx are unremarkable. Salivary glands: The parotid and submandibular glands are unremarkable. Thyroid: Unremarkable. Lymph nodes: There is no pathologic lymphadenopathy in the neck. Vascular: There is calcified atherosclerotic plaque of the bilateral carotid bulbs. The left internal jugular vein is occluded in the upper neck, likely chronic. Limited intracranial:  The imaged portions of the intracranial compartment are unremarkable. Visualized orbits: The orbits are not imaged. Mastoids and visualized paranasal sinuses: The imaged paranasal sinuses and mastoid air cells are clear. Skeleton: There is multilevel degenerative change of  the cervical spine. There is no aggressive osseous lesion or acute osseous abnormality. Upper chest: A tracheostomy tube is in place, new since the prior CT. There is no evidence of complication, the tip is not imaged. The lungs and thorax are assessed on the separately dictated CT chest. Other: None. IMPRESSION: 1. Enhancing lesion in the region of the right arytenoid cartilage measuring approximately 2.5 cm x 1.4 cm corresponding to the known malignancy seen on recent endoscopy. There is involvement of the right aryepiglottic fold with effacement of the right piriform sinus and involvement of the right pre epiglottic and paraglottic fat. 2. Mild hyperenhancement of the right true vocal fold raises suspicion for involvement (though no abnormality was seen on endoscopy). No definite involvement of the false fold or evidence of subglottic extension. 3. No lymphadenopathy in the neck. 4. Please also refer to the separately dictated CT chest. Electronically Signed   By: Valetta Mole M.D.   On: 07/10/2021 16:05   CT CHEST W CONTRAST  Result Date: 07/10/2021 CLINICAL DATA:  Renal mass.  Staging of head neck cancer. EXAM: CT CHEST WITH CONTRAST TECHNIQUE: Multidetector CT imaging of the chest was performed during intravenous contrast administration. CONTRAST:  59mL OMNIPAQUE IOHEXOL 300 MG/ML  SOLN COMPARISON:  Neck CT from 07/02/2021; chest CT from 07/01/2021 and chest CT from 05/18/2018. FINDINGS: Cardiovascular: Coronary, aortic arch, and branch vessel atherosclerotic vascular disease. Small chronic anterior pericardial effusion. Mediastinum/Nodes: Tracheostomy tube satisfactorily position. Enteric tube extends down into the stomach. Scattered  calcified mediastinal and hilar lymph nodes compatible with old granulomatous disease. No pathologic thoracic adenopathy observed. Lungs/Pleura: Mild biapical pleuroparenchymal scarring. 0.5 cm partially calcified right upper lobe nodule medially on image 18 series 4, previously 0.7 cm on 05/18/2018, considered benign. Mildly spiculated right upper lobe nodule 0.9 by 0.6 cm on image 20 series 4, previously 0.9 by 0.7 cm on 05/18/2018, this has a component extending to the pleural surface in a scar-like manner. Additional scattered scarring is present in the right lung along with calcified right upper lobe granuloma and possible adjacent broncholith spirits Airway plugging in both lower lobes with small bilateral pleural effusions and bilateral airway thickening. Stable 3 mm subpleural nodule in the left lower lobe along the fissure on image 42 series 4. No substantial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 INTRAOCULAR LENS  IMPLANT, BILATERAL    COLONOSCOPY W/ POLYPECTOMY  04/20/2010  CYSTOSCOPY WITH URETHRAL DILATATION N/A 11/10/2018  Procedure: CYSTOSCOPY WITH URETHRAL DILATATION;  Surgeon: Irine Seal, MD;  Location: [facility];  Service: Urology;  Laterality: N/A;  DIRECT LARYNGOSCOPY N/A 07/02/2021  Procedure: DIRECT LARYNGOSCOPY WITH BIOPSY;  Surgeon: Jason Coop, DO;   Location: [facility];  Service: ENT;  Laterality: N/A;  ESOPHAGOGASTRODUODENOSCOPY (EGD) WITH PROPOFOL N/A 03/02/2020  Procedure: ESOPHAGOGASTRODUODENOSCOPY (EGD) WITH PROPOFOL;  Surgeon: Yetta Flock, MD;  Location: [facility];  Service: Gastroenterology;  Laterality: N/A;  HERNIA REPAIR  07/04/01  right side inguinal  HOT HEMOSTASIS N/A 03/02/2020  Procedure: HOT HEMOSTASIS (ARGON PLASMA COAGULATION/BICAP);  Surgeon: Yetta Flock, MD;  Location: [facility] ENDOSCOPY;  Service: Gastroenterology;  Laterality: N/A;  LARYNGOSCOPY    with biopsy, right vocal cord lesion   OMENTECTOMY    age 57  PROSTATE SURGERY    not full excision  SCLEROTHERAPY  03/02/2020  Procedure: SCLEROTHERAPY;  Surgeon: Yetta Flock, MD;  Location: [facility] ENDOSCOPY;  Service: Gastroenterology;;  TOTAL HIP ARTHROPLASTY Right 04/11/2018  Procedure: TOTAL HIP ARTHROPLASTY ANTERIOR APPROACH;  Surgeon: Lovell Sheehan, MD;  Location: [facility];  Service: Orthopedics;  Laterality: Right;  TRACHEOSTOMY TUBE PLACEMENT N/A 07/02/2021  Procedure: TRACHEOSTOMY;  Surgeon: Skotnicki, Meghan A, DO;  Location: [facility];  Service: ENT;  Laterality: N/A;  TRANSURETHRAL RESECTION OF PROSTATE N/A 11/10/2018  Procedure: TRANSURETHRAL RESECTION OF THE PROSTATE (TURP);  Surgeon: Irine Seal, MD;  Location: [facility];  Service: Urology;  Laterality: N/A;  VENA CAVA FILTER PLACEMENT  2015 HPI: Pt is an 87 y/o male admitted 8/10 secondary to worsening SOB, especially with exertion. Found to have acute respiratory failure with hypoxia. Imaging showed fullness of the right aryepiglottic fold extending into the right pyriform and L renal mass. Pt had flexible laryngoscopy on 8/10, including trach.  Findings revealed bilateral true vocal folds in paramedian position with normal adduction, restricted abduction causing narrowing of the airway. No evidence of airway obstruction. Mucosal abnormality/ edema of right supraglottis/glottis noted with no exophytic mass lesion, biopsy  completed.  PMH includes vocal cord cancer ~ 30 years ago, COPD, CAD, DM, HTN, R THA, bilateral hearing aids.  Subjective: alert, dtr at bedside Assessment / Plan / Recommendation CHL IP CLINICAL IMPRESSIONS 07/04/2021 Clinical Impression Pt participated in a very limited MBS due to large volume aspiration on the first nectar-thick liquid bolus.  There was poor mobility of the hyo-laryngeal mechanism, leading to immediate penetration of barium into the larynx with subsequent aspiration.  An attempted swallow led to marginal amount of barium passing through the UES and into the esophagus. The rest of the residue was aspirated. Under typical circumstances, more than one bolus is tested in order to offer the patient opportunities to have more success - however, Mr. Hintz appeared anxious and uncomfortable, coughing became excessive so the study was discontinued.  Pt's cuff was inflated during the study, and the PMV was not utilized, so both of these circumstances may have influenced the negative outcome of the test.  For now, recommend continuing NPO; allow occasional ice chips after oral care; encourage pt to complete his own oral care using a toothbrush as often as possible. D/W RNs who were present for exam.  SLP will follow. SLP Visit Diagnosis Dysphagia, pharyngeal phase (R13.13) Attention and concentration deficit following -- Frontal lobe and executive function  deficit following -- Impact on safety and function Severe aspiration risk   CHL IP TREATMENT RECOMMENDATION 07/04/2021 Treatment Recommendations Therapy as outlined in treatment plan below   Prognosis 07/04/2021 Prognosis for Safe Diet Advancement Fair Barriers to Reach Goals -- Barriers/Prognosis Comment -- CHL IP DIET RECOMMENDATION 07/04/2021 SLP Diet Recommendations Ice chips PRN after oral care;NPO;Alternative means - temporary Liquid Administration via -- Medication Administration -- Compensations -- Postural Changes --   CHL IP OTHER RECOMMENDATIONS  07/04/2021 Recommended Consults -- Oral Care Recommendations Oral care QID Other Recommendations --   CHL IP FOLLOW UP RECOMMENDATIONS 07/04/2021 Follow up Recommendations Outpatient SLP   CHL IP FREQUENCY AND DURATION 07/04/2021 Speech Therapy Frequency (ACUTE ONLY) min 2x/week Treatment Duration 2 weeks      CHL IP ORAL PHASE 07/04/2021 Oral Phase WFL Oral - Pudding Teaspoon -- Oral - Pudding Cup -- Oral - Honey Teaspoon -- Oral - Honey Cup -- Oral - Nectar Teaspoon -- Oral - Nectar Cup -- Oral - Nectar Straw -- Oral - Thin Teaspoon -- Oral - Thin Cup -- Oral - Thin Straw -- Oral - Puree -- Oral - Mech Soft -- Oral - Regular -- Oral - Multi-Consistency -- Oral - Pill -- Oral Phase - Comment --  CHL IP PHARYNGEAL PHASE 07/04/2021 Pharyngeal Phase Impaired Pharyngeal- Pudding Teaspoon -- Pharyngeal -- Pharyngeal- Pudding Cup -- Pharyngeal -- Pharyngeal- Honey Teaspoon -- Pharyngeal -- Pharyngeal- Honey Cup -- Pharyngeal -- Pharyngeal- Nectar Teaspoon Reduced pharyngeal peristalsis;Reduced epiglottic inversion;Reduced anterior laryngeal mobility;Reduced laryngeal elevation;Reduced airway/laryngeal closure;Reduced tongue base retraction;Penetration/Aspiration before swallow;Penetration/Aspiration during swallow;Penetration/Apiration after swallow;Significant aspiration (Amount);Pharyngeal residue - pyriform;Pharyngeal residue - valleculae Pharyngeal Material enters airway, passes BELOW cords without attempt by patient to eject out (silent aspiration);Material enters airway, passes BELOW cords and not ejected out despite cough attempt by patient Pharyngeal- Nectar Cup -- Pharyngeal -- Pharyngeal- Nectar Straw -- Pharyngeal -- Pharyngeal- Thin Teaspoon -- Pharyngeal -- Pharyngeal- Thin Cup -- Pharyngeal -- Pharyngeal- Thin Straw -- Pharyngeal -- Pharyngeal- Puree -- Pharyngeal -- Pharyngeal- Mechanical Soft -- Pharyngeal -- Pharyngeal- Regular -- Pharyngeal -- Pharyngeal- Multi-consistency -- Pharyngeal -- Pharyngeal- Pill  -- Pharyngeal -- Pharyngeal Comment --  No flowsheet data found. Juan Quam Laurice 07/04/2021, 10:27 AM              ECHOCARDIOGRAM COMPLETE  Result Date: 07/14/2021    ECHOCARDIOGRAM REPORT   Patient Name:   ADREN DOLLINS Date of Exam: 07/14/2021 Medical Rec #:  [id number]      Height:       66.0 in Accession #:    [id number]     Weight:       155.6 lb Date of Birth:  1931/05/24      BSA:          1.798 m Patient Age:    87 years       BP:           186/52 mmHg Patient Gender: M              HR:           58 bpm. Exam Location:  Inpatient Procedure: 2D Echo, Cardiac Doppler, Color Doppler and Intracardiac            Opacification Agent Indications:     Abnormal EKG  History:         Patient has prior history of Echocardiogram examinations, most  recent 08/10/2019. COPD, Signs/Symptoms:Dyspnea; Risk                  Factors:Diabetes, Hypertension and Former Smoker. Resp.                  failure, tracheostomy.  Sonographer:     Dustin Flock RDCS Referring Phys:  [place] Diagnosing Phys: Oswaldo Milian MD  Sonographer Comments: Technically difficult study due to poor echo windows, suboptimal parasternal window, suboptimal apical window and suboptimal subcostal window. Image acquisition challenging due to COPD. IMPRESSIONS  1. Technically difficult study, very poor visualization of left ventricle even after contrast administration. Left ventricular ejection fraction, by estimation, is 55 to 60%. The left ventricle has grossly normal function but very poorly visualized. Left ventricular endocardial border not optimally defined to evaluate regional wall motion. Left ventricular diastolic parameters are indeterminate.  2. Right ventricular systolic function is normal. The right ventricular size is mildly enlarged. Tricuspid regurgitation signal is inadequate for assessing PA pressure.  3. Right atrial size was moderately dilated.  4. A small pericardial effusion is present.  The pericardial effusion is anterior to the right ventricle. Presence of pericardial fat pad.  5. The mitral valve is grossly normal. No evidence of mitral valve regurgitation.  6. The aortic valve was not well visualized. Aortic valve regurgitation is not visualized. No aortic stenosis is present.  7. The inferior vena cava is normal in size with greater than 50% respiratory variability, suggesting right atrial pressure of 3 mmHg. FINDINGS  Left Ventricle: Left ventricular ejection fraction, by estimation, is 55 to 60%. The left ventricle has normal function. Left ventricular endocardial border not optimally defined to evaluate regional wall motion. Definity contrast agent was given IV to delineate the left ventricular endocardial borders. The left ventricular internal cavity size was not well visualized. There is not well visualized left ventricular hypertrophy. Left ventricular diastolic parameters are indeterminate. Right Ventricle: The right ventricular size is mildly enlarged. Right vetricular wall thickness was not well visualized. Right ventricular systolic function is normal. Tricuspid regurgitation signal is inadequate for assessing PA pressure. Left Atrium: Left atrial size was normal in size. Right Atrium: Right atrial size was moderately dilated. Pericardium: A small pericardial effusion is present. The pericardial effusion is anterior to the right ventricle. Presence of pericardial fat pad. Mitral Valve: The mitral valve is grossly normal. No evidence of mitral valve regurgitation. Tricuspid Valve: The tricuspid valve is normal in structure. Tricuspid valve regurgitation is trivial. Aortic Valve: The aortic valve was not well visualized. Aortic valve regurgitation is not visualized. No aortic stenosis is present. Pulmonic Valve: The pulmonic valve was not well visualized. Pulmonic valve regurgitation is not visualized. Aorta: The aortic root was not well visualized. Venous: The inferior vena cava is  normal in size with greater than 50% respiratory variability, suggesting right atrial pressure of 3 mmHg. IAS/Shunts: The interatrial septum was not well visualized.   Diastology LV e' medial:    4.03 cm/s LV E/e' medial:  9.7 LV e' lateral:   4.79 cm/s LV E/e' lateral: 8.1  RIGHT VENTRICLE RV Basal diam:  4.30 cm RV S prime:     9.14 cm/s TAPSE (M-mode): 1.8 cm LEFT ATRIUM           Index       RIGHT ATRIUM           Index LA Vol (A4C): 51.7 ml 28.76 ml/m RA Area:     23.40 cm  RA Volume:   73.80 ml  41.05 ml/m  AORTIC VALVE LVOT Vmax:   64.00 cm/s LVOT Vmean:  40.600 cm/s LVOT VTI:    0.131 m MITRAL VALVE MV Area (PHT): 3.15 cm    SHUNTS MV Decel Time: 241 msec    Systemic VTI: 0.13 m MV E velocity: 39.00 cm/s MV A velocity: 48.00 cm/s MV E/A ratio:  0.81 Oswaldo Milian MD Electronically signed by Oswaldo Milian MD Signature Date/Time: 07/14/2021/11:52:38 AM    Final (Updated)    CT MAXILLOFACIAL WO CONTRAST  Result Date: 07/02/2021 CLINICAL DATA:  Initial evaluation for chronic sinusitis, history of vocal cord cancer. EXAM: CT MAXILLOFACIAL WITHOUT CONTRAST CT NECK WITHOUT CONTRAST TECHNIQUE: Multidetector CT imaging of the neck was performed following the standard protocol without intravenous contrast. COMPARISON:  None available. FINDINGS: Pharynx and larynx: Visualized oral cavity within normal limits. No visible acute abnormality about the dentition. Oropharynx and nasopharynx within normal limits. No retropharyngeal collection or swelling. Epiglottis normal. Vallecula clear. There is asymmetric soft tissue density/fullness at the base of the right aryepiglottic fold extending into the right piriformis sinus (series 3, image 61). Difficult to measure or define a discrete mass within this region given lack of IV contrast. Possible involvement of the right false cord and laryngeal ventricle. Asymmetric sclerosis with medialization of the right arytenoid  cartilage and right true cord, which also may be involved (series 3, image 66). Asymmetric infiltration within the right paraglottic fat (series 3, image 62) no definite erosive changes of the adjacent thyroid cartilage. Contralateral left aryepiglottic fold within normal limits. Left piriform sinus clear. Subglottic airway patent clear. Salivary glands: Apparent 1.1 cm soft tissue density lesion at the superior aspect of the right parotid gland noted (series 3, image 13). Finding is closely approximated to adjacent vascular structures, and could reflect a portion of a non-opacified vein, not well assessed on this noncontrast examination. There is an additional 1.2 cm lesion with central calcification along the posterior margin of the right parotid gland inferiorly (series 3, image 30), indeterminate. Left parotid gland unremarkable. Submandibular glands within normal limits. Thyroid: Normal. Lymph nodes: No enlarged or pathologic adenopathy seen on this noncontrast examination. Vascular: Moderate atheromatous change about the visualized aortic arch and proximal great vessels, carotid bifurcations, and skull base. Limited intracranial: Age-related cerebral atrophy. Otherwise unremarkable. Visualized orbits: Prior bilateral ocular lens replacement. Globes and orbital soft tissues demonstrate no acute finding. Mastoids and visualized paranasal sinuses: Chronic mucosal thickening noted about the left frontal sinus, with scattered mucoperiosteal thickening about the ethmoidal air cells bilaterally. Paranasal sinuses are otherwise clear. No air-fluid levels to suggest acute sinusitis. Mastoid air cells and middle ear cavities are well pneumatized and free of fluid. Skeleton: No visible acute osseous finding. No discrete or worrisome osseous lesions. Mild chronic compression deformity noted involving the T4 vertebral body. Moderate spondylosis noted at C6-7. Upper chest: Emphysematous changes noted within the visualized  lungs. 1.2 cm spiculated nodular density with associated architectural distortion present at the right upper lobe, indeterminate. Few additional pleural base nodular densities measuring 7 mm (series 4, image 39) and 9 mm (series 4, image 26) seen at the posterior 0 medial right upper lobe. Visualized upper chest demonstrates no other acute finding. Other: None. IMPRESSION: 1. Asymmetric soft tissue density/fullness at the base of the right aryepiglottic fold extending into the right piriformis sinus, with possible involvement of the right glottis inferiorly as above. Difficult to measure or define a discrete mass within this region given lack  of IV contrast. While this finding could reflect sequelae of prior treatment, possible locally recurrent tumor could also have this appearance. Correlation with direct visualization recommended. No adenopathy. 2. 1.2 cm spiculated right upper lobe nodule, with a few additional pleural based nodules at the posterior right upper lobe as above. Findings are indeterminate. Consider one of the following in 3 months for both low-risk and high-risk individuals: (a) repeat chest CT, (b) follow-up PET-CT, or (c) tissue sampling. This recommendation follows the consensus statement: Guidelines for Management of Incidental Pulmonary Nodules Detected on CT Images: From the Fleischner Society 2017; Radiology 2017; 284:228-243. 3. Mild chronic frontoethmoidal sinusitis. No CT evidence for active disease at this time. 4. Two small approximate 1 cm soft tissue density lesions involving the right parotid gland as above, indeterminate. Nonemergent outpatient ENT referral for further workup and evaluation suggested. 5. Aortic Atherosclerosis (ICD10-I70.0) and Emphysema (ICD10-J43.9). Electronically Signed   By: Jeannine Boga M.D.   On: 07/02/2021 03:11      IMPRESSION/PLAN:  This is a delightful patient with head and neck cancer.  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  I talked  in detail with the patient and his family about curative treatment options.  We discussed definitive radiation extending from 4 to 7 weeks.  We discussed the risks benefits and side effects of treatment.  They understand that treatment could potentially cure his cancer and that I think it is realistic that he could tolerate treatments despite potential side effects.  They understand that treatment could increase his chances of ultimately having his tracheostomy tube removed and restoring his ability to swallow.  That said, no guarantees of treatment were given.   We discussed how regression of his cancer could improve his quality of life.  We discussed how it could extend his overall survival.  The patient is adamant that he does not want treatment.  He is adamant that he does not want aggressive measures to prolong his life.  The patient's daughter and wife support his wishes.  He does not seem clinically depressed. They empathize with how his quality of life has gone down, over the recent months, before he started having any direct symptoms from his laryngeal mass.  His comorbidities have made it very difficult for him to contribute to his wife's care and this was his greatest joy in life.  The patient's daughter reports that he finds his meaning in life in his ability to care for his wife and it has been very difficult for him not to be able to do this anymore.  I also discussed less aggressive palliative treatment options which would take place over 1 to 2 weeks.  He is not interested in these at all, either.  We will cancel his treatment planning.  I believe that another discussion with palliative care is important before they finalize their decision to pursue best supportive care / hospice.  However, this seems to be where they are heavily leaning.  Anderson Malta, our navigator, was present for the consultation.  The patient and his family know that we are available if we can help them in the future but  otherwise I will sign off from his case.  I wished him the very best moving forward.  On date of service, in total, I spent 60 minutes on this encounter. Patient was seen in person.  __________________________________________   Eppie Gibson, MD  This document serves as a record of services personally performed by Eppie Gibson, MD. It was created  on her behalf by Roney Mans, a trained medical scribe. The creation of this record is based on the scribe's personal observations and the provider's statements to them. This document has been checked and approved by the attending provider.

## 2021-07-29 NOTE — Progress Notes (Signed)
Called to pt room by staff, pt states that he was unable to breath. Sats were 98-100%. Pt was suctioned and did return a  small amount of thick yellow sputum. Respiratory also came in to assess pt. Trach intact with collar in place.Pt was also given medication for anxiety. Will continue to monitor and tx as indicated.

## 2021-07-29 NOTE — NC FL2 (Signed)
[place] LEVEL OF CARE SCREENING TOOL     IDENTIFICATION  Patient Name: Christopher Santiago Birthdate: 1931-07-16 Sex: male Admission Date (Current Location): 07/01/2021  [facility] and [place] Number:  Herbalist and Address:  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   Tracheostomy status ([place])    Tracheobronchitis    Protein-calorie malnutrition, severe 07/03/2021   Left renal mass 07/02/2021   Respiratory failure, acute ([place]) 07/02/2021   Acute respiratory failure with hypoxia ([place]) 07/01/2021   Back pain 06/05/2020   Constipation 06/05/2020   Memory change 07/03/2019   BPH with urinary obstruction 11/10/2018   Bronchiectasis ([place]) 08/11/2018   Anemia 08/11/2018   Osteoarthritis of right hip 04/11/2018   Ascending aorta dilation ([place]) 03/09/2018   Pulmonary hypertension, unspecified ([place]) 03/09/2018   Chronic diastolic CHF (congestive heart failure) ([place]) 03/09/2018   Chronic cough 06/08/2016   Alcohol use 02/20/2016   Mixed hyperlipidemia 02/10/2016   SOB (shortness of breath) 08/26/2015   Bradycardia 08/26/2015   Acute pulmonary embolism ([place]) 11/19/2014   COPD with acute exacerbation ([place]) 12/27/2012   Biceps tendonitis 07/12/2012   Medicare annual wellness visit, subsequent 02/04/2012   Advance directive discussed with patient 02/04/2012   PVD (peripheral vascular disease) ([place]) 12/22/2011   Rhinitis  01/29/2011   CERVICAL STRAIN, ACUTE 04/08/2010   Hypothyroidism 01/17/2009   CARCINOMA, SKIN, SQUAMOUS CELL 01/08/2009   Weakness 12/27/2008   Depression 08/22/2008   Gout 12/12/2007   OBESITY 09/24/2007   Atherosclerosis of native coronary artery of native heart with stable angina pectoris ([place]) 06/16/2007   ERECTILE DYSFUNCTION 05/07/2007   OSA on CPAP 05/05/2007   Essential hypertension 05/05/2007   Coronary atherosclerosis 05/05/2007   SLEEP APNEA 05/05/2007   Hyperglycemia 04/05/2007    Orientation RESPIRATION BLADDER Height & Weight     Self, Time, Situation, Place   (Placed 07/03/21.  Current size:Shiley 6 Prox XLT Cuffless) External catheter Weight: 156 lb 15.5 oz (71.2 kg) Height:  _0  (167.6 cm)  BEHAVIORAL SYMPTOMS/MOOD NEUROLOGICAL BOWEL NUTRITION STATUS      Continent Feeding tube  AMBULATORY STATUS COMMUNICATION OF NEEDS Skin   Limited Assist Verbally Other (Comment) (ecchymosis)                       Personal Care Assistance Level of Assistance  Bathing, Feeding, Dressing Bathing Assistance: Limited assistance Feeding assistance: Limited assistance Dressing Assistance: Limited assistance     Functional Limitations Info  Sight, Hearing, Speech Sight Info: Adequate Hearing Info: Adequate Speech Info: Adequate    SPECIAL CARE FACTORS FREQUENCY  PT (By licensed PT), OT (By licensed OT), Speech therapy     PT Frequency: 5x week OT Frequency: 5x week     Speech Therapy Frequency: 2x week      Contractures Contractures Info: Not present    Additional Factors Info  Insulin Sliding Scale Code Status Info: DNR Allergies Info: Tessalon (Benzonatate)  Insulin Sliding Scale Info: Novolog, 0-9 units q4hrs.  See discharge summary.       Current Medications (07/29/2021):  This is the current hospital active medication list Current Facility-Administered Medications  Medication Dose Route Frequency Provider Last Rate Last Admin   acetaminophen  (TYLENOL) tablet 500 mg  500 mg Per Tube TID Norm Parcel, PA-C   500 mg at 07/29/21 0933   albuterol (PROVENTIL) (2.5 MG/3ML) 0.083% nebulizer solution 2.5 mg  2.5 mg Nebulization Q2H PRN Norm Parcel, PA-C       allopurinol (ZYLOPRIM) tablet 100 mg  100 mg Oral Daily Norm Parcel, PA-C   100 mg at 07/29/21 0934   amLODipine (NORVASC) tablet 5 mg  5 mg Oral Daily Norm Parcel, PA-C   5 mg at 07/27/21 0957   arformoterol (BROVANA) nebulizer solution 15 mcg  15 mcg Nebulization BID Norm Parcel, PA-C   15 mcg at 07/29/21 9937   bisacodyl (DULCOLAX) suppository 10 mg  10 mg Rectal Daily PRN Norm Parcel, PA-C       budesonide (PULMICORT) nebulizer solution 0.25 mg  0.25 mg Nebulization BID Norm Parcel, PA-C   0.25 mg at 07/29/21 1696   chlorhexidine gluconate (MEDLINE KIT) (PERIDEX) 0.12 % solution 15 mL  15 mL Mouth Rinse BID Norm Parcel, PA-C   15 mL at 07/29/21 0932   Chlorhexidine Gluconate Cloth 2 % PADS 6 each  6 each Topical Daily Norm Parcel, PA-C   6 each at 07/29/21 7893   feeding supplement (OSMOLITE 1.5 CAL) liquid 1,000 mL  1,000 mL Per Tube Continuous Norm Parcel, PA-C 55 mL/hr at 07/29/21 0212 1,000 mL at 07/29/21 8101   feeding supplement (PROSource TF) liquid 45 mL  45 mL Per Tube BID Norm Parcel, PA-C   45 mL at 07/29/21 7510   free water 150 mL  150 mL Per Tube Q4H Norm Parcel, PA-C   150 mL at 07/29/21 1247   guaiFENesin (ROBITUSSIN) 100 MG/5ML solution 200 mg  10 mL Oral Q8H Barkley Boards R, PA-C   200 mg at 07/29/21 1248   hydrALAZINE (APRESOLINE) injection 10 mg  10 mg Intravenous Q6H PRN Barkley Boards R, PA-C   10 mg at 07/15/21 1618   insulin aspart (novoLOG) injection 0-9 Units  0-9 Units Subcutaneous Q4H Elgergawy, Silver Huguenin, MD   1 Units at 07/29/21 1247   lidocaine (XYLOCAINE) 2 % jelly 1 application  1 application Urethral Once Norm Parcel, PA-C       lip balm (CARMEX) ointment   Topical PRN Norm Parcel, PA-C        LORazepam (ATIVAN) injection 0.25 mg  0.25 mg Intravenous Q4H PRN Norm Parcel, PA-C   0.25 mg at 07/29/21 0257   MEDLINE mouth rinse  15 mL Mouth Rinse 10 times per day Norm Parcel, PA-C   15 mL at 07/29/21 1247   nystatin (MYCOSTATIN) 100000 UNIT/ML suspension 500,000 Units  5 mL Oral QID Norm Parcel, PA-C   500,000 Units at 07/29/21 1248   revefenacin (YUPELRI) nebulizer solution 175 mcg  175 mcg Nebulization Daily Norm Parcel, PA-C   175 mcg at 07/29/21 0849   scopolamine (TRANSDERM-SCOP) 1 MG/3DAYS 1.5 mg  1 patch Transdermal Q72H Barkley Boards R, PA-C   1.5 mg at 07/28/21 2224   sennosides (SENOKOT) 8.8 MG/5ML syrup 5 mL  5 mL Oral BID Barkley Boards R, PA-C   5 mL  at 07/29/21 0929   sodium chloride flush (NS) 0.9 % injection 10-40 mL  10-40 mL Intracatheter Q12H Norm Parcel, PA-C   10 mL at 07/29/21 9753   sodium chloride flush (NS) 0.9 % injection 10-40 mL  10-40 mL Intracatheter PRN Norm Parcel, PA-C   10 mL at 07/26/21 2132   traMADol (ULTRAM) tablet 25 mg  25 mg Oral Q6H PRN Norm Parcel, PA-C   25 mg at 07/10/21 0116     Discharge Medications: Please see discharge summary for a list of discharge medications.  Relevant Imaging Results:  Relevant Lab Results:   Additional Information SSN: [id number]. Pt is vaccinated for covid with one booster.  [name], LCSW

## 2021-07-29 NOTE — Progress Notes (Signed)
Oncology Nurse Navigator Documentation   Mr. Blatz is scheduled for consult and CT simulation at [facility] tomorrow starting at 1:30. I have called and spoken with his daughter who is aware and plans to be present. I have also called Mr. Koval nurse Alroy Dust on 2W progressive care at [facility] to request [place] transport be set up to get Mr. Scurlock to [facility] for his appointments. He voiced to me that he would get transportation arranged.   Harlow Asa RN, BSN, OCN Head & Neck Oncology Nurse [place] at [facility] Phone # [phone]  Fax # [phone]

## 2021-07-29 NOTE — TOC Progression Note (Signed)
Transition of Care [facility]) - Progression Note    Patient Details  Name: Christopher Santiago MRN: [id number] Date of Birth: 1931/07/15  [facility] CM/SW Contact  [name], LCSW Phone Number: 07/29/2021, 1:21 PM  Clinical Narrative:  CSW spoke with pt wife Christopher Santiago, updated her on sending pt referral out for SNF placement.  She is also asking about [facility].    Pt referral sent out in hub.  Sent message asking Seth Bake at [facility] to review.  New trach in place since 07/03/21.      Expected Discharge Plan: [place] Barriers to Discharge: Continued Medical Work up, SNF Pending bed offer, Other (must enter comment) (pt has new trach placed on 07/02/21)  Expected Discharge Plan and Services Expected Discharge Plan: [place] Choice: [place] arrangements for the past 2 months: Single Family Home                                       Social Determinants of Health (SDOH) Interventions    Readmission Risk Interventions No flowsheet data found.

## 2021-07-29 NOTE — Progress Notes (Signed)
PROGRESS NOTE  Christopher Santiago MCN:[id number] DOB: Apr 24, 1931 DOA: 07/01/2021 PCP: Tonia Ghent, MD   LOS: 28 days    Brief Narrative / Interim history: 87 year old with past medical history significant for COPD, quit smoking 30 years ago, vocal cord cancer, diabetes who presents complaining of exertional dyspnea, reports sinusitis, postnasal drip, hoarseness for the past 3 to 4 months. He was also complaining of difficulty swallowing, food gets stuck in the throat, he also report phlegm, in his throat. CT maxillofacial, soft tissue neck: Asymmetric soft tissue density/fullness at the base of the right aryepiglottic fold, extending into the right piriform sinus with possible involvement of the right glottis inferiorly.  If echo to measure or define a discrete mass within this region given lack of IV contrast. ENT consulted for direct visualization.  Patient underwent  laryngoscopy with biopsy and tracheostomy 8/10.  received  PEG tube. Insurance denied inpatient rehabilitation.  SNF being pursued.  Has outpatient follow-up with radiation oncology.  Continues to have some secretions despite scopolamine patch.    Subjective Overnight events noted.  Apparently had some difficulty breathing around 3:00 this morning.  Saturations were noted to be normal.  Patient was suctioned.  Small amount of thick yellow sputum was noted.  Subsequently patient was able to sleep.  Denies any complaints this morning.     Assessment & Plan:  Acute Hypoxic Respiratory Failure, tracheostomy in place -Suspect related to component of aspiration due to dysphagia larynx CA, COPD exacerbation.   -He has completed 5 days of IV steroids  -Completed 7 days of cefepime with last day 8/21 -Respiratory culture with normal respiratory flora -ENT has been managing trach care -Patient is tolerating PM Valve most of the day, where his speech is fluent and very understandable. -Continue with scopolamine patch for  secretions. Continues to have secretions despite scopolamine patch.  Required suctioning once in a while.  We will repeat a chest x-ray today.  Seems to be stable for the most part.  He is on trach collar with saturations in the 90s.    Sinus bradycardia/pauses Noted to have sinus bradycardia on 9/3.  Patient was completely asymptomatic.  Noted to have pauses on telemetry but this is a chronic.  Not noted to be on any AV blocking medications. Thyroid function tests are normal.  Electrolytes have been normal.  Bradycardia could have been due to excessive suctioning from a vagal response. Heart rate has been stable for the last 48 hours.  Continue to monitor.  Squamous cell carcinoma Larynx -CT showed asymmetric soft tissue density fullness at the base of the right aryepiglottic fold. ENT was consulted. Patient underwent laryngoscopy with biopsy and tracheostomy 8/11. Stable on ATC 5 L oxygen.  -Biopsy showed squamous cell carcinoma with foci suspicious for invasion.  -Per ENT note when he was seen and evaluated initially, he was discussed at tumor board, will have outpatient follow-up with medical oncology, radiation oncology.  CT scan of the neck and chest showing that the pulmonary nodules have not progressed since 2019, therefore benign -Trach changed to Shiley 6 Prox XLT Cuffless.  Management per ENT Patient to be followed at cancer center.  Patient's family is supposed to meet with radiation oncologist tomorrow.  Severe protein calorie nutrition: Continue tube feedings via PEG.  Acute metabolic encephalopathy Had significant delirium initially but this appears to have resolved.  Seems to be back to baseline.  1.2 cm spiculated right upper lobe nodule-outpatient follow up   Left Renal mass -Concerning  for renal carcinoma, will need to follow-up with urology as an outpatient.  These findings were communicated to patient's daughter by previous providers.   Diabetes mellitus type II,  controlled HbA1c 5.8.  Monitor CBGs.  SSI.  Essential hypertension: Blood pressure stable.  Noted to be on amlodipine.  Normocytic anemia No evidence of overt bleeding.  Hemoglobin remained stable.   Leukocytosis probably related to steroids vs infection.  Resolved   DVT prophylaxis: On SCDs CODE STATUS: DNR  Family Communication: No family at bedside. Disposition: Patient's insurance denied CIR.  Will likely need to go to SNF.  Status is: Inpatient  Remains inpatient appropriate because:Inpatient level of care appropriate due to severity of illness  Dispo: The patient is from: Home              Anticipated d/c is to:  To be determined, most likely SNF              Patient currently is not medically stable to d/c.   Difficult to place patient No     Consultants:  ENT Trauma surgery for PEG Palliaitve  PCCM  Procedures:  Laryngoscopy / trach 2d echo IMPRESSIONS   1. Technically difficult study, very poor visualization of left ventricle even after contrast administration. Left ventricular ejection fraction, by estimation, is 55 to 60%. The left ventricle has grossly normal function but very poorly visualized. Left ventricular endocardial border not optimally defined to evaluate regional wall motion. Left ventricular diastolic parameters are indeterminate.   2. Right ventricular systolic function is normal. The right ventricular size is mildly enlarged. Tricuspid regurgitation signal is inadequate for assessing PA pressure.   3. Right atrial size was moderately dilated.   4. A small pericardial effusion is present. The pericardial effusion is anterior to the right ventricle. Presence of pericardial fat pad.   5. The mitral valve is grossly normal. No evidence of mitral valve regurgitation.   6. The aortic valve was not well visualized. Aortic valve regurgitation is not visualized. No aortic stenosis is present.   7. The inferior vena cava is normal in size with greater than  50% respiratory variability, suggesting right atrial pressure of 3 mmHg.   Microbiology  none  Antimicrobials: None Scheduled Meds:  acetaminophen  500 mg Per Tube TID   allopurinol  100 mg Oral Daily   amLODipine  5 mg Oral Daily   arformoterol  15 mcg Nebulization BID   budesonide (PULMICORT) nebulizer solution  0.25 mg Nebulization BID   chlorhexidine gluconate (MEDLINE KIT)  15 mL Mouth Rinse BID   Chlorhexidine Gluconate Cloth  6 each Topical Daily   feeding supplement (PROSource TF)  45 mL Per Tube BID   free water  150 mL Per Tube Q4H   guaiFENesin  10 mL Oral Q8H   insulin aspart  0-9 Units Subcutaneous Q4H   lidocaine  1 application Urethral Once   mouth rinse  15 mL Mouth Rinse 10 times per day   nystatin  5 mL Oral QID   revefenacin  175 mcg Nebulization Daily   scopolamine  1 patch Transdermal Q72H   sennosides  5 mL Oral BID   sodium chloride flush  10-40 mL Intracatheter Q12H   Continuous Infusions:  feeding supplement (OSMOLITE 1.5 CAL) 1,000 mL (07/29/21 0212)   PRN Meds:.albuterol, bisacodyl, hydrALAZINE, lip balm, LORazepam, sodium chloride flush, traMADol  Diet Orders (From admission, onward)     Start     Ordered   07/03/21 1433  Diet NPO time specified Except for: Ice Chips  Diet effective now       Comments: Occasional ice chips after oral care  Question:  Except for  Answer:  Ice Chips   07/03/21 1432               Objective: Vitals:   07/29/21 0700 07/29/21 0800 07/29/21 0852 07/29/21 0900  BP:  (!) 123/41 (!) 123/41   Pulse: (!) 57 60 66 60  Resp: _0 (!) 21  Temp:  97.6 F (36.4 C)    TempSrc:  Oral    SpO2: 98% 98% 100% 100%  Weight:      Height:       No intake or output data in the 24 hours ending 07/29/21 0955  Filed Weights   07/27/21 0334 07/28/21 0300 07/29/21 0327  Weight: 73.5 kg 71.2 kg 71.2 kg    Examination:  Patient is awake alert.  In no distress Tracheostomy is noted. Normal effort at rest noted.   Coarse breath sounds with crackles at the bases.  No wheezing or rhonchi S1-S2 is normal regular.  No S3-S4. Abdomen is soft.  PEG tube is noted. Telemetry shows sinus rhythm in the 60s to 70s.  Occasional pauses noted.    Data Reviewed: I have independently reviewed following labs and imaging studies   CBC: Recent Labs  Lab 07/23/21 0103 07/28/21 0142  WBC 7.2 9.0  HGB 10.2* 10.3*  HCT 32.2* 32.6*  MCV 95.0 94.5  PLT 182 726    Basic Metabolic Panel: Recent Labs  Lab 07/23/21 0103 07/28/21 0142  NA 138 136  K 4.3 4.5  CL 102 100  CO2 29 28  GLUCOSE 123* 147*  BUN 24* 31*  CREATININE 0.69 0.75  CALCIUM 9.3 9.5  MG 2.2 2.3    CBG: Recent Labs  Lab 07/28/21 1653 07/28/21 2034 07/29/21 0008 07/29/21 0324 07/29/21 0808  GLUCAP 116* 98 129* 131* 135*      Radiology Studies: No results found.   Bonnielee Haff  [facility] Pager: [email or web address]

## 2021-07-30 ENCOUNTER — Ambulatory Visit
Admission: RE | Admit: 2021-07-30 | Discharge: 2021-07-30 | Disposition: A | Discharge status: Home / Self Care | Payer: Medicare Other | Source: Ambulatory Visit | Attending: Radiation Oncology | Admitting: Radiation Oncology

## 2021-07-30 ENCOUNTER — Ambulatory Visit: Payer: Medicare Other | Admitting: Radiation Oncology

## 2021-07-30 ENCOUNTER — Ambulatory Visit
Admit: 2021-07-30 | Discharge: 2021-07-30 | Disposition: A | Discharge status: Home / Self Care | Payer: Medicare Other | Attending: Radiation Oncology | Admitting: Radiation Oncology

## 2021-07-30 ENCOUNTER — Encounter: Payer: Self-pay | Admitting: Radiation Oncology

## 2021-07-30 DIAGNOSIS — C321 Malignant neoplasm of supraglottis: Secondary | ICD-10-CM | POA: Insufficient documentation

## 2021-07-30 DIAGNOSIS — G4733 Obstructive sleep apnea (adult) (pediatric): Secondary | ICD-10-CM | POA: Diagnosis not present

## 2021-07-30 DIAGNOSIS — D3501 Benign neoplasm of right adrenal gland: Secondary | ICD-10-CM | POA: Diagnosis not present

## 2021-07-30 DIAGNOSIS — Z87442 Personal history of urinary calculi: Secondary | ICD-10-CM | POA: Insufficient documentation

## 2021-07-30 DIAGNOSIS — J3489 Other specified disorders of nose and nasal sinuses: Secondary | ICD-10-CM | POA: Diagnosis not present

## 2021-07-30 DIAGNOSIS — N2889 Other specified disorders of kidney and ureter: Secondary | ICD-10-CM | POA: Insufficient documentation

## 2021-07-30 DIAGNOSIS — E291 Testicular hypofunction: Secondary | ICD-10-CM | POA: Insufficient documentation

## 2021-07-30 DIAGNOSIS — J90 Pleural effusion, not elsewhere classified: Secondary | ICD-10-CM | POA: Diagnosis not present

## 2021-07-30 DIAGNOSIS — I712 Thoracic aortic aneurysm, without rupture: Secondary | ICD-10-CM | POA: Insufficient documentation

## 2021-07-30 DIAGNOSIS — J329 Chronic sinusitis, unspecified: Secondary | ICD-10-CM | POA: Diagnosis not present

## 2021-07-30 DIAGNOSIS — R634 Abnormal weight loss: Secondary | ICD-10-CM | POA: Insufficient documentation

## 2021-07-30 DIAGNOSIS — Z8719 Personal history of other diseases of the digestive system: Secondary | ICD-10-CM | POA: Insufficient documentation

## 2021-07-30 DIAGNOSIS — Z7189 Other specified counseling: Secondary | ICD-10-CM | POA: Diagnosis not present

## 2021-07-30 DIAGNOSIS — I272 Pulmonary hypertension, unspecified: Secondary | ICD-10-CM | POA: Insufficient documentation

## 2021-07-30 DIAGNOSIS — I700 Atherosclerosis of aorta: Secondary | ICD-10-CM | POA: Diagnosis not present

## 2021-07-30 DIAGNOSIS — I313 Pericardial effusion (noninflammatory): Secondary | ICD-10-CM | POA: Insufficient documentation

## 2021-07-30 DIAGNOSIS — I2510 Atherosclerotic heart disease of native coronary artery without angina pectoris: Secondary | ICD-10-CM | POA: Diagnosis not present

## 2021-07-30 DIAGNOSIS — J439 Emphysema, unspecified: Secondary | ICD-10-CM | POA: Diagnosis not present

## 2021-07-30 DIAGNOSIS — I2699 Other pulmonary embolism without acute cor pulmonale: Secondary | ICD-10-CM | POA: Diagnosis not present

## 2021-07-30 DIAGNOSIS — J441 Chronic obstructive pulmonary disease with (acute) exacerbation: Secondary | ICD-10-CM | POA: Diagnosis not present

## 2021-07-30 DIAGNOSIS — J9601 Acute respiratory failure with hypoxia: Secondary | ICD-10-CM | POA: Diagnosis not present

## 2021-07-30 DIAGNOSIS — Z87891 Personal history of nicotine dependence: Secondary | ICD-10-CM | POA: Insufficient documentation

## 2021-07-30 DIAGNOSIS — E785 Hyperlipidemia, unspecified: Secondary | ICD-10-CM | POA: Insufficient documentation

## 2021-07-30 DIAGNOSIS — F0390 Unspecified dementia without behavioral disturbance: Secondary | ICD-10-CM | POA: Diagnosis not present

## 2021-07-30 DIAGNOSIS — M47812 Spondylosis without myelopathy or radiculopathy, cervical region: Secondary | ICD-10-CM | POA: Diagnosis not present

## 2021-07-30 DIAGNOSIS — Z86711 Personal history of pulmonary embolism: Secondary | ICD-10-CM | POA: Insufficient documentation

## 2021-07-30 DIAGNOSIS — I119 Hypertensive heart disease without heart failure: Secondary | ICD-10-CM | POA: Diagnosis not present

## 2021-07-30 DIAGNOSIS — K5730 Diverticulosis of large intestine without perforation or abscess without bleeding: Secondary | ICD-10-CM | POA: Diagnosis not present

## 2021-07-30 DIAGNOSIS — E1151 Type 2 diabetes mellitus with diabetic peripheral angiopathy without gangrene: Secondary | ICD-10-CM | POA: Diagnosis not present

## 2021-07-30 DIAGNOSIS — Z86018 Personal history of other benign neoplasm: Secondary | ICD-10-CM | POA: Insufficient documentation

## 2021-07-30 DIAGNOSIS — I10 Essential (primary) hypertension: Secondary | ICD-10-CM | POA: Diagnosis not present

## 2021-07-30 DIAGNOSIS — I340 Nonrheumatic mitral (valve) insufficiency: Secondary | ICD-10-CM | POA: Diagnosis not present

## 2021-07-30 DIAGNOSIS — K760 Fatty (change of) liver, not elsewhere classified: Secondary | ICD-10-CM | POA: Diagnosis not present

## 2021-07-30 DIAGNOSIS — J9621 Acute and chronic respiratory failure with hypoxia: Secondary | ICD-10-CM | POA: Diagnosis not present

## 2021-07-30 DIAGNOSIS — Z930 Tracheostomy status: Secondary | ICD-10-CM | POA: Insufficient documentation

## 2021-07-30 LAB — GLUCOSE, CAPILLARY
Glucose-Capillary: 113 mg/dL — ABNORMAL HIGH (ref 70–99)
Glucose-Capillary: 114 mg/dL — ABNORMAL HIGH (ref 70–99)
Glucose-Capillary: 126 mg/dL — ABNORMAL HIGH (ref 70–99)
Glucose-Capillary: 126 mg/dL — ABNORMAL HIGH (ref 70–99)
Glucose-Capillary: 131 mg/dL — ABNORMAL HIGH (ref 70–99)
Glucose-Capillary: 71 mg/dL (ref 70–99)
Glucose-Capillary: 82 mg/dL (ref 70–99)

## 2021-07-30 NOTE — Progress Notes (Signed)
Palliative-   HPI: 87 y.o. male  with past medical history of COPD, vocal cord cancer, DM admitted on 07/01/2021 with complaints of exertional dyspnea, sinusitis, hoarseness for 3 months. 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 He is s/p trach on 8/10. PEG placement is pending, IR has been consulted. Palliative medicine consulted for goals of care.     Palliative reconsulted for GOC: "[place]. Has a sick wife at home and has cancer of the lung". Palliative initially consulted with patient and family and GOC were determined as follows:   "  DNR At this point family is hopeful for patient to become accustomed to his trach, and have PEG placed- he is easily redirected when he attempts to pull at his trach and IV's, however, due to his impaired memory (which is much worse than he was prior to admission) he quickly forgets and begins to pull- they are going to take a picture of him and create a poster to remind him. We discussed the challenges of cancer treatment for patient's with dementia Family is hopeful for a discussion with Oncology regarding his cancer and treatment options- they will continue to weigh treatment options while also considering his quality of life and goals of care.  At that time family noted that if they proceeded with cancer treatment then they would continue to monitor for his quality of life. Hospice was discussed in the event that patient did not tolerate cancer treatment and/or chose not to proceed with cancer therapy after meeting with the Oncologist.  He was seen again on 8/24 and was noted that his mental status had improved greatly and GOC were again confirmed. He was being worked up for [employer].   Noted disposition has been a bit difficult due to insurance declining CIR, and SNF will not take until trach is older than 30 days.   Discussion with RN indicates that Palliative was reconsulted due to patient stating that if cancer treatment was unsuccessful  and impaired his quality of life then he would want to go home with Hospice. This is in line with previously stated goals of care discussions.   I met with patient briefly before his departure to the cancer center to discuss treatment options- he is doing well, no complaints, in agreement with plan. He is nearing the 30 day mark of his trach and placement at SNF is being sought.   Goals appear to be clearly established. Continue to recommend Palliative to follow outpatient as he is high risk of not tolerating therapy and decompensating.   Mariana Kaufman, AGNP-C Palliative Medicine  Greater than 50%  of this time was spent counseling and coordinating care related to the above assessment and plan.  Total time: 28 minutes

## 2021-07-30 NOTE — Progress Notes (Signed)
Head and Neck Cancer Location of Tumor / Histology: Squamous Cell Carcinoma of Larynx  Patient presented to the ED on 07/01/2021 with complaints of exertional dyspnea with associated sinusitis, post nasal drip, and hoarseness for 3-4 months.  He reports difficulty with swallowing, states food is getting stuck in his throat and increased phlegm production.  PEG placement 07/15/2021  CT Soft Tissue neck/ Chest 07/10/2021: Enhancing lesion in the region of the right arytenoid cartilage measuring approximately 2.5 cm x 1.4 cm corresponding to the known malignancy seen on recent endoscopy. There is involvement of the right aryepiglottic fold with effacement of the right piriform sinus and involvement of the right pre epiglottic and paraglottic fat.  Mild hyperenhancement of the right true vocal fold raises suspicion for involvement (though no abnormality was seen on endoscopy). No definite involvement of the false fold or evidence of subglottic extension.  CT soft tissue neck/maxillofacial 07/02/2021:Asymmetric soft tissue density/fullness at the base of the right aryepiglottic fold extending into the right piriformis sinus, with possible involvement of the right glottis inferiorly as above.  Difficult to measure or define a discrete mass within this region given lack of IV contrast. While this finding could reflect sequelae of prior treatment, possible locally recurrent tumor could also have this appearance. Correlation with direct visualization recommended. No adenopathy.  1.2 cm spiculated right upper lobe nodule, with a few additional pleural based nodules at the posterior right upper lobe as above.  Findings are indeterminate  Two small approximate 1 cm soft tissue density lesions involving the right parotid gland as above, indeterminate.   Biopsies of Arytenoid Mass 07/02/2021  Nutrition Status Yes No Comments  Weight changes? []  []    Swallowing concerns? []  []    PEG? [x]  []     Referrals Yes No  Comments  Social Work? []  []    Dentistry? []  []    Swallowing therapy? []  []    Nutrition? []  []    Med/Onc? []  []     Safety Issues Yes No Comments  Prior radiation? []  []    Pacemaker/ICD? []  []    Possible current pregnancy? []  []    Is the patient on methotrexate? []  []     Tobacco/Marijuana/Snuff/ETOH use:   Past/Anticipated interventions by otolaryngology, if any:   Past/Anticipated interventions by medical oncology, if any:      Current Complaints / other details:

## 2021-07-30 NOTE — Progress Notes (Signed)
Physical Therapy Treatment Patient Details Name: Christopher Santiago MRN: [id number] DOB: 08-09-1931 Today's Date: 07/30/2021    History of Present Illness 87 y/o male admitted 8/9 secondary to worsening SOB, especially with exertion. Found to have acute respiratory failure with hypoxia. Imaging showed fullness of the right aryepiglottic fold extending into the right pyriform and L renal mass. Pt found to have left renal mass 8/9. Pt had Flexible laryngoscopy on 8/10 with trach. PEG 8/23. Acute course complicated by metabolic encephalopathy. PMHx:vocal cord cancer, COPD, CAD, DM, HTN, and R THA, gout, PVD, BPH.    PT Comments    Patient is very pleasant and agreeable to PT session. He is mod independent with bed mobility, transfers with min guard. Ambulated 150 feet with RW on room air with min guard.  Reports mild fatigue. Good awareness. He will continue to benefit from skilled PT while here to improve functional independence and strength. Will need assist at home if that is where he goes.     Follow Up Recommendations  Home health PT;Supervision/Assistance - 24 hour     Equipment Recommendations  Rolling walker with 5" wheels    Recommendations for Other Services       Precautions / Restrictions Precautions Precautions: Fall Precaution Comments: trach, peg Restrictions Weight Bearing Restrictions: No    Mobility  Bed Mobility Overal bed mobility: Modified Independent Bed Mobility: Supine to Sit;Sit to Supine     Supine to sit: Modified independent (Device/Increase time);HOB elevated Sit to supine: Modified independent (Device/Increase time);HOB elevated        Transfers Overall transfer level: Needs assistance Equipment used: Rolling walker (2 wheeled) Transfers: Sit to/from Stand Sit to Stand: Supervision            Ambulation/Gait Ambulation/Gait assistance: Min guard Gait Distance (Feet): 150 Feet Assistive device: Rolling walker (2 wheeled) Gait  Pattern/deviations: Step-through pattern Gait velocity: slightly decreased   General Gait Details: Does well with mobility, slight fatigue. O2 sats remained > 95% throughout mobility on room air.   Stairs             Wheelchair Mobility    Modified Rankin (Stroke Patients Only)       Balance Overall balance assessment: Needs assistance Sitting-balance support: Feet supported Sitting balance-Leahy Scale: Good     Standing balance support: During functional activity;Bilateral upper extremity supported Standing balance-Leahy Scale: Fair Standing balance comment: Relies on RW for mobilty                            Cognition Arousal/Alertness: Awake/alert Behavior During Therapy: WFL for tasks assessed/performed Overall Cognitive Status: Within Functional Limits for tasks assessed                                        Exercises      General Comments        Pertinent Vitals/Pain Pain Assessment: No/denies pain    Home Living                      Prior Function            PT Goals (current goals can now be found in the care plan section) Acute Rehab PT Goals Patient Stated Goal: wants to go home to be with wife. She is disabled PT Goal Formulation: With patient Time For Goal  Achievement: 08/11/21 Potential to Achieve Goals: Good Progress towards PT goals: Progressing toward goals    Frequency    Min 3X/week      PT Plan Current plan remains appropriate    Co-evaluation              AM-PAC PT "6 Clicks" Mobility   Outcome Measure  Help needed turning from your back to your side while in a flat bed without using bedrails?: A Little Help needed moving from lying on your back to sitting on the side of a flat bed without using bedrails?: A Little Help needed moving to and from a bed to a chair (including a wheelchair)?: A Little Help needed standing up from a chair using your arms (e.g., wheelchair or  bedside chair)?: A Little Help needed to walk in hospital room?: A Little Help needed climbing 3-5 steps with a railing? : A Little 6 Click Score: 18    End of Session Equipment Utilized During Treatment: Gait belt Activity Tolerance: Patient tolerated treatment well Patient left: in bed;with call bell/phone within reach;with bed alarm set Nurse Communication: Mobility status PT Visit Diagnosis: Muscle weakness (generalized) (M62.81)     Time: 0315-9458 PT Time Calculation (min) (ACUTE ONLY): 25 min  Charges:  $Gait Training: 23-37 mins                    Tarrance Januszewski, PT, GCS 07/30/21,10:47 AM

## 2021-07-30 NOTE — Progress Notes (Signed)
PROGRESS NOTE    Christopher Santiago  QQP:[id number] DOB: [birth year] DOA: 07/01/2021 PCP: Tonia Ghent, MD   Brief Narrative:  The patient is an 87 year old elderly Caucasian male with a past medical history significant for but not limited to COPD who quit smoking 30 years ago, history of vocal cord cancer, diabetes mellitus type 2 as well as other comorbidities who presented with exertional dyspnea, reports sinusitis and postnasal drip and hoarseness for the last 3 to 4 months.  Is also complaining of difficulty swallowing of food getting stuck in his throat and he also reported phlegm in his throat.  CT maxillofacial soft tissue neck was done and showed asymmetric soft tissue density and fullness at the base of the right aryepiglottic fold extending right piriform sinus with possible involvement of the right glottis inferiorly.  Imaging studies was not able to determine if there is a discrete mass.  ENT was consulted for direct visualization and patient underwent laryngoscopy with biopsy and tracheostomy and he is also Status post PEG tube placement.  Further work-up revealed that he had squamous cell carcinoma of the larynx.  He has been worked up for [facility] but has been refused for inpatient rehabilitation.  SNF is being pursued and currently has a appointment with radiation oncology for a squamous cell carcinoma of the larynx.  Assessment & Plan:   Principal Problem:   Acute respiratory failure with hypoxia (HCC) Active Problems:   Essential hypertension   COPD with acute exacerbation (HCC)   Left renal mass   Respiratory failure, acute (HCC)   Protein-calorie malnutrition, severe   Tracheostomy status (HCC)   Tracheobronchitis   Dementia (HCC)  Acute Hypoxic Respiratory Failure, tracheostomy in place -Suspect related to component of aspiration due to dysphagia larynx CA, COPD exacerbation.   -He has completed 5 days of IV steroids  -Completed 7 days of cefepime with last day  8/21 -Respiratory culture with normal respiratory flora -ENT has been managing trach care -Patient is tolerating PM Valve most of the day, where his speech is fluent and very understandable. -Continue with scopolamine patch for secretions. Continues to have secretions despite scopolamine patch.  Required suctioning once in a while.  We will repeat a chest x-ray today.  Seems to be stable for the most part.  He is on trach collar with saturations in the 90s.     Sinus bradycardia/pauses -Noted to have sinus bradycardia on 9/3.   -Patient was completely asymptomatic.  Noted to have pauses on telemetry but this is a chronic.  Not noted to be on any AV blocking medications. Thyroid function tests are normal.  Electrolytes have been normal.  Bradycardia could have been due to excessive suctioning from a vagal response. Heart rate has been stable for the last 48 hours.  Continue to monitor.   Squamous cell carcinoma Larynx -CT showed asymmetric soft tissue density fullness at the base of the right aryepiglottic fold. ENT was consulted. Patient underwent laryngoscopy with biopsy and tracheostomy 8/11. Stable on ATC 5 L oxygen.  -Biopsy showed squamous cell carcinoma with foci suspicious for invasion.  -Per ENT note when he was seen and evaluated initially, he was discussed at tumor board, will have outpatient follow-up with medical oncology, radiation oncology.  CT scan of the neck and chest showing that the pulmonary nodules have not progressed since 2019, therefore benign -Trach changed to Shiley 6 Prox XLT Cuffless.  Management per ENT -Patient to be followed at cancer center.  Patient's family is  supposed to meet with radiation oncologist today and this was done at 1 PM -Per my discussion with social work cannot safely discharge the patient with his fresh tracheostomy as a will be difficult to find him placement -Palliative care has been consulted for further goals of care discussion after family  discussion the family will continue to monitor for his quality of life and hospice has been discussed in the event that the patient does not tolerate cancer treatment or to not to proceed with a cancer therapy after meeting with oncology   Acute metabolic encephalopathy -Had significant delirium initially but this appears to have resolved.  Seems to be back to baseline.   1.2 cm spiculated right upper lobe nodule -outpatient follow up with pulmonary and medical oncology   Left Renal mass  -Concerning for renal carcinoma, will need to follow-up with urology as an outpatient.   -These findings were communicated to patient's daughter by previous providers.   Diabetes mellitus type II, controlled HbA1c 5.8.  Monitor CBGs.  SSI.-CBGs ranging from 80 to-141   Essential hypertension:  -Blood pressure stable.  Noted to be on amlodipine. -Continue to monitor blood pressures per protocol -Last blood pressure reading was 140/73   Normocytic anemia -No evidence of overt bleeding.  Hemoglobin remained stable. -Last hemoglobin/hematocrit was 10.3/32.6 -Continue monitor for signs and symptoms of bleeding; currently no overt bleeding noted   Leukocytosis  -Probably related to steroids vs infection.  Resolved on last check and it was 9.0 on 07/28/2021  Severe protein calorie malnutrition -Nutritionist consulted for further evaluation recommendations -Nutritional services recommending continuing tube feedings via PEG with Osmolite 1.5 at 55 MLS per hour and recommending Prosource tube feedings at 45 mL twice daily and 150 mL free water flushes every 4 hours  DVT prophylaxis: SCDs Code Status: DO NOT RESUSCITATE Family Communication: No family present at bedside Disposition Plan: Original plan was for the patient to go to SNF for now PT OT recommended recommendation for home health; may be able to send him home pending on his clinical improvement  Status is: Inpatient  Remains inpatient appropriate  because:Unsafe d/c plan, IV treatments appropriate due to intensity of illness or inability to take PO, and Inpatient level of care appropriate due to severity of illness  Dispo: The patient is from: Home              Anticipated d/c is to: Home with home health versus SNF              Patient currently is not medically stable to d/c.   Difficult to place patient No  Consultants:  ENT Trauma surgery for PEG Palliaitve  PCCM  Procedures:  Laryngoscopy / trach  ECHOCARDIOGRAM IMPRESSIONS   1. Technically difficult study, very poor visualization of left ventricle even after contrast administration. Left ventricular ejection fraction, by estimation, is 55 to 60%. The left ventricle has grossly normal function but very poorly visualized. Left ventricular endocardial border not optimally defined to evaluate regional wall motion. Left ventricular diastolic parameters are indeterminate.   2. Right ventricular systolic function is normal. The right ventricular size is mildly enlarged. Tricuspid regurgitation signal is inadequate for assessing PA pressure.   3. Right atrial size was moderately dilated.   4. A small pericardial effusion is present. The pericardial effusion is anterior to the right ventricle. Presence of pericardial fat pad.   5. The mitral valve is grossly normal. No evidence of mitral valve regurgitation.   6. The aortic  valve was not well visualized. Aortic valve regurgitation is not visualized. No aortic stenosis is present.   7. The inferior vena cava is normal in size with greater than 50% respiratory variability, suggesting right atrial pressure of 3 mmHg.   Antimicrobials:  Anti-infectives (From admission, onward)    Start     Dose/Rate Route Frequency Ordered Stop   07/12/21 2200  ceFEPIme (MAXIPIME) 2 g in sodium chloride 0.9 % 100 mL IVPB        2 g 200 mL/hr over 30 Minutes Intravenous Every 12 hours 07/12/21 1555 07/14/21 2359   07/10/21 2200  ceFEPIme (MAXIPIME) 2  g in sodium chloride 0.9 % 100 mL IVPB  Status:  Discontinued        2 g 200 mL/hr over 30 Minutes Intravenous Every 8 hours 07/10/21 1316 07/12/21 1555   07/08/21 0800  vancomycin (VANCOCIN) IVPB 1000 mg/200 mL premix  Status:  Discontinued        1,000 mg 200 mL/hr over 60 Minutes Intravenous Every 24 hours 07/07/21 0747 07/09/21 0855   07/07/21 0900  vancomycin (VANCOREADY) IVPB 1250 mg/250 mL        1,250 mg 166.7 mL/hr over 90 Minutes Intravenous  Once 07/07/21 0747 07/07/21 1412   07/07/21 0900  ceFEPIme (MAXIPIME) 2 g in sodium chloride 0.9 % 100 mL IVPB  Status:  Discontinued        2 g 200 mL/hr over 30 Minutes Intravenous Every 12 hours 07/07/21 0747 07/10/21 1316   07/07/21 0800  cefTRIAXone (ROCEPHIN) 1 g in sodium chloride 0.9 % 100 mL IVPB  Status:  Discontinued        1 g 200 mL/hr over 30 Minutes Intravenous Every 24 hours 07/07/21 0707 07/07/21 0709   07/06/21 1430  fluconazole (DIFLUCAN) IVPB 100 mg  Status:  Discontinued        100 mg 50 mL/hr over 60 Minutes Intravenous Every 24 hours 07/06/21 1341 07/06/21 1404   07/02/21 2100  cefTRIAXone (ROCEPHIN) 1 g in sodium chloride 0.9 % 100 mL IVPB        1 g 200 mL/hr over 30 Minutes Intravenous Every 24 hours 07/02/21 0114 07/06/21 2325   07/01/21 2145  cefTRIAXone (ROCEPHIN) 1 g in sodium chloride 0.9 % 100 mL IVPB        1 g 200 mL/hr over 30 Minutes Intravenous  Once 07/01/21 2141 07/01/21 2235   07/01/21 2145  azithromycin (ZITHROMAX) 500 mg in sodium chloride 0.9 % 250 mL IVPB        500 mg 250 mL/hr over 60 Minutes Intravenous  Once 07/01/21 2141 07/02/21 0000        Subjective: Seen and examined at bedside and he states that he is feeling well.  Had a meeting with oncology at the cancer center today at 1 PM.  No chest pain or shortness of breath.  Feels okay.  Denies any other concerns or complaints this time.  Objective: Vitals:   07/30/21 1108 07/30/21 1200 07/30/21 1637 07/30/21 1700  BP:   140/73    Pulse: 78 75 78 78  Resp: _0 Temp:   98.7 F (37.1 C)   TempSrc:   Oral   SpO2: 100% 100% 100% 100%  Weight:      Height:        Intake/Output Summary (Last 24 hours) at 07/30/2021 1729 Last data filed at 07/30/2021 1443 Gross per 24 hour  Intake 1549 ml  Output 450 ml  Net 1099 ml   Filed Weights   07/27/21 0334 07/28/21 0300 07/29/21 0327  Weight: 73.5 kg 71.2 kg 71.2 kg   Examination: Physical Exam:  Constitutional: Thin elderly Caucasian male currently no acute distress with a tracheostomy status and PEG tube Eyes: Lids and conjunctivae normal, sclerae anicteric  ENMT: External Ears, Nose appear normal. Grossly normal hearing.  Neck: He has a tracheostomy in place that was connected to 5 L ATC but was weaned off Respiratory: Diminished to auscultation bilaterally with coarse breath sounds, no wheezing, rales, rhonchi or crackles. Normal respiratory effort and patient is not tachypenic. No accessory muscle use.  Unlabored breathing Cardiovascular: RRR, no murmurs / rubs / gallops. S1 and S2 auscultated. No extremity edema. Abdomen: Soft, non-tender, non-distended.  PEG tube is in place oBel sounds positive.  GU: Deferred. Musculoskeletal: No clubbing / cyanosis of digits/nails. No joint deformity upper and lower extremities. Good ROM, no contractures. Normal strength and muscle tone.  Skin: No rashes, lesions, ulcers. No induration; Warm and dry.  Neurologic: CN 2-12 grossly intact with no focal deficits. Romberg sign and cerebellar reflexes not assessed.  Psychiatric: Normal judgment and insight. Alert and oriented x 3. Normal mood and appropriate affect.   Data Reviewed: I have personally reviewed following labs and imaging studies  CBC: Recent Labs  Lab 07/28/21 0142  WBC 9.0  HGB 10.3*  HCT 32.6*  MCV 94.5  PLT 259   Basic Metabolic Panel: Recent Labs  Lab 07/28/21 0142  NA 136  K 4.5  CL 100  CO2 28  GLUCOSE 147*  BUN 31*  CREATININE 0.75   CALCIUM 9.5  MG 2.3   GFR: Estimated Creatinine Clearance: 57.6 mL/min (by C-G formula based on SCr of 0.75 mg/dL). Liver Function Tests: No results for input(s): AST, ALT, ALKPHOS, BILITOT, PROT, ALBUMIN in the last 168 hours. No results for input(s): LIPASE, AMYLASE in the last 168 hours. No results for input(s): AMMONIA in the last 168 hours. Coagulation Profile: No results for input(s): INR, PROTIME in the last 168 hours. Cardiac Enzymes: No results for input(s): CKTOTAL, CKMB, CKMBINDEX, TROPONINI in the last 168 hours. BNP (last 3 results) Recent Labs    11/08/20 1106  PROBNP 482.0*   HbA1C: No results for input(s): HGBA1C in the last 72 hours. CBG: Recent Labs  Lab 07/30/21 0411 07/30/21 0727 07/30/21 1154 07/30/21 1636 07/30/21 1709  GLUCAP 126* 126* 113* 71 82   Lipid Profile: No results for input(s): CHOL, HDL, LDLCALC, TRIG, CHOLHDL, LDLDIRECT in the last 72 hours. Thyroid Function Tests: No results for input(s): TSH, T4TOTAL, FREET4, T3FREE, THYROIDAB in the last 72 hours. Anemia Panel: No results for input(s): VITAMINB12, FOLATE, FERRITIN, TIBC, IRON, RETICCTPCT in the last 72 hours. Sepsis Labs: No results for input(s): PROCALCITON, LATICACIDVEN in the last 168 hours.  No results found for this or any previous visit (from the past 240 hour(s)).   RN Pressure Injury Documentation:    Estimated body mass index is 25.34 kg/m as calculated from the following:   Height as of this encounter: 5' 6" (1.676 m).   Weight as of this encounter: 71.2 kg.  Malnutrition Type:  Nutrition Problem: Severe Malnutrition Etiology: chronic illness (COPD)  Malnutrition Characteristics:  Signs/Symptoms: severe fat depletion, severe muscle depletion  Nutrition Interventions:  Interventions: Refer to RD note for recommendations     Radiology Studies: DG CHEST PORT 1 VIEW  Result Date: 07/29/2021 CLINICAL DATA:  Shortness of breath. EXAM: PORTABLE CHEST 1 VIEW  COMPARISON:  07/06/2021 FINDINGS: 1047 hours. Tracheostomy tube again noted. No pulmonary edema. Tiny nodular density right upper lobe likely calcified granuloma, stable. There is some atelectasis or infiltrate at the left base. No pneumothorax. Bones are diffusely demineralized. Telemetry leads overlie the chest. IMPRESSION: 1. Question atelectasis or infiltrate at the left base. Otherwise no acute cardiopulmonary findings. 2. Stable right upper lobe calcified granuloma. Electronically Signed   By: Misty Stanley M.D.   On: 07/29/2021 12:47    Scheduled Meds:  acetaminophen  500 mg Per Tube TID   allopurinol  100 mg Oral Daily   amLODipine  5 mg Oral Daily   arformoterol  15 mcg Nebulization BID   budesonide (PULMICORT) nebulizer solution  0.25 mg Nebulization BID   chlorhexidine gluconate (MEDLINE KIT)  15 mL Mouth Rinse BID   Chlorhexidine Gluconate Cloth  6 each Topical Daily   feeding supplement (PROSource TF)  45 mL Per Tube BID   free water  150 mL Per Tube Q4H   guaiFENesin  10 mL Oral Q8H   insulin aspart  0-9 Units Subcutaneous Q4H   lidocaine  1 application Urethral Once   mouth rinse  15 mL Mouth Rinse 10 times per day   nystatin  5 mL Oral QID   revefenacin  175 mcg Nebulization Daily   scopolamine  1 patch Transdermal Q72H   sennosides  5 mL Oral BID   sodium chloride flush  10-40 mL Intracatheter Q12H   Continuous Infusions:  feeding supplement (OSMOLITE 1.5 CAL) 1,000 mL (07/29/21 2159)    LOS: 29 days   Kerney Elbe, DO [facility] PAGER is on AMION  If 7PM-7AM, please contact night-coverage [email or web address]

## 2021-07-31 DIAGNOSIS — J441 Chronic obstructive pulmonary disease with (acute) exacerbation: Secondary | ICD-10-CM | POA: Diagnosis not present

## 2021-07-31 DIAGNOSIS — I10 Essential (primary) hypertension: Secondary | ICD-10-CM | POA: Diagnosis not present

## 2021-07-31 DIAGNOSIS — J9601 Acute respiratory failure with hypoxia: Secondary | ICD-10-CM | POA: Diagnosis not present

## 2021-07-31 DIAGNOSIS — F0390 Unspecified dementia without behavioral disturbance: Secondary | ICD-10-CM | POA: Diagnosis not present

## 2021-07-31 LAB — COMPREHENSIVE METABOLIC PANEL
ALT: 24 U/L (ref 0–44)
AST: 17 U/L (ref 15–41)
Albumin: 2.5 g/dL — ABNORMAL LOW (ref 3.5–5.0)
Alkaline Phosphatase: 67 U/L (ref 38–126)
Anion gap: 7 (ref 5–15)
BUN: 40 mg/dL — ABNORMAL HIGH (ref 8–23)
CO2: 27 mmol/L (ref 22–32)
Calcium: 9 mg/dL (ref 8.9–10.3)
Chloride: 102 mmol/L (ref 98–111)
Creatinine, Ser: 0.84 mg/dL (ref 0.61–1.24)
GFR, Estimated: >60 mL/min (ref >60)
Glucose, Bld: 156 mg/dL — ABNORMAL HIGH (ref 70–99)
Potassium: 4.2 mmol/L (ref 3.5–5.1)
Sodium: 136 mmol/L (ref 135–145)
Total Bilirubin: 0.6 mg/dL (ref 0.3–1.2)
Total Protein: 6.4 g/dL — ABNORMAL LOW (ref 6.5–8.1)

## 2021-07-31 LAB — GLUCOSE, CAPILLARY
Glucose-Capillary: 102 mg/dL — ABNORMAL HIGH (ref 70–99)
Glucose-Capillary: 120 mg/dL — ABNORMAL HIGH (ref 70–99)
Glucose-Capillary: 137 mg/dL — ABNORMAL HIGH (ref 70–99)
Glucose-Capillary: 139 mg/dL — ABNORMAL HIGH (ref 70–99)
Glucose-Capillary: 143 mg/dL — ABNORMAL HIGH (ref 70–99)
Glucose-Capillary: 98 mg/dL (ref 70–99)
Glucose-Capillary: 98 mg/dL (ref 70–99)
Glucose-Capillary: >600 mg/dL (ref 70–99)

## 2021-07-31 LAB — CBC WITH DIFFERENTIAL/PLATELET
Abs Immature Granulocytes: 0.03 10*3/uL (ref 0.00–0.07)
Basophils Absolute: 0 10*3/uL (ref 0.0–0.1)
Basophils Relative: 1 %
Eosinophils Absolute: 0.2 10*3/uL (ref 0.0–0.5)
Eosinophils Relative: 3 %
HCT: 31.8 % — ABNORMAL LOW (ref 39.0–52.0)
Hemoglobin: 10.3 g/dL — ABNORMAL LOW (ref 13.0–17.0)
Immature Granulocytes: 0 %
Lymphocytes Relative: 15 %
Lymphs Abs: 1.2 10*3/uL (ref 0.7–4.0)
MCH: 30.4 pg (ref 26.0–34.0)
MCHC: 32.4 g/dL (ref 30.0–36.0)
MCV: 93.8 fL (ref 80.0–100.0)
Monocytes Absolute: 0.9 10*3/uL (ref 0.1–1.0)
Monocytes Relative: 12 %
Neutro Abs: 5.4 10*3/uL (ref 1.7–7.7)
Neutrophils Relative %: 69 %
Platelets: 194 10*3/uL (ref 150–400)
RBC: 3.39 MIL/uL — ABNORMAL LOW (ref 4.22–5.81)
RDW: 13.4 % (ref 11.5–15.5)
WBC: 7.8 10*3/uL (ref 4.0–10.5)
nRBC: 0 % (ref 0.0–0.2)

## 2021-07-31 LAB — MAGNESIUM: Magnesium: 2.2 mg/dL (ref 1.7–2.4)

## 2021-07-31 LAB — PHOSPHORUS: Phosphorus: 3.8 mg/dL (ref 2.5–4.6)

## 2021-07-31 NOTE — Progress Notes (Signed)
Physical Therapy Treatment Patient Details Name: Christopher Santiago MRN: [id number] DOB: Nov 16, 1931 Today's Date: 07/31/2021    History of Present Illness 86 y/o male admitted 8/9 secondary to worsening SOB, especially with exertion. Found to have acute respiratory failure with hypoxia. Imaging showed fullness of the right aryepiglottic fold extending into the right pyriform and L renal mass. Pt found to have left renal mass 8/9. Pt had Flexible laryngoscopy on 8/10 with trach. PEG 8/23. Acute course complicated by metabolic encephalopathy. PMHx:vocal cord cancer, COPD, CAD, DM, HTN, and R THA, gout, PVD, BPH.    PT Comments    Pt tolerated treatment well and was motivated to ambulate in hall. Demonstrated similar ambulation distance with RW compared to previous sessions. Pt progressed to stair training and navigated well; however, fatigued quickly immediately after with decreased sats (83%). Increased sats >90% with cues for deep breathing and seated rest. Updated d/c to SNF as pt is "scared" to return home as it would be extremely difficult to take care of himself and his wife. Pt unable to care for trach, peg, or bathroom needs. Encouraged pt to speak to daughter about d/c plan and additional options following SNF. Spoke to Education officer, museum about update in plan and pt's fear of going home.    Follow Up Recommendations  SNF;Supervision/Assistance - 24 hour     Equipment Recommendations  Rolling walker with 5" wheels    Recommendations for Other Services       Precautions / Restrictions Precautions Precautions: Fall Precaution Comments: trach, peg Restrictions Weight Bearing Restrictions: No    Mobility  Bed Mobility Overal bed mobility: Needs Assistance Bed Mobility: Supine to Sit   Sidelying to sit: HOB elevated;Min guard       General bed mobility comments: HOB elevated to 50 degrees. Min gaurd for safety. Pt moved LEs off EOB and elevated trunk well.    Transfers Overall  transfer level: Needs assistance Equipment used: Rolling walker (2 wheeled) Transfers: Sit to/from [facility] Sit to Stand: Min guard Stand pivot transfers: Min guard       General transfer comment: Min guard for safety on transfers. Sit to stand x2 and stand pivot transfer. Pt demonstrated good recall of cues for hand placement given previously.  Ambulation/Gait Ambulation/Gait assistance: Min guard Gait Distance (Feet): 150 Feet Assistive device: Rolling walker (2 wheeled) Gait Pattern/deviations: Step-through pattern Gait velocity: decreased Gait velocity interpretation: 1.31 - 2.62 ft/sec, indicative of limited community ambulator General Gait Details: Pt ambulates with slow and steady pace and was motivated to move. O2 sats >90% on RA during ambulation with RR increased to 35. Min guard for safety.   Stairs Stairs: Yes Stairs assistance: Min guard Stair Management: One rail Right;Step to pattern;Sideways Number of Stairs: 6 General stair comments: Navigated 6 stairs with verbal cues for step to pattern and navigating sideways. Pt successfully completed stairs, however immediately after reported feeling SOB with sats 83% on RA. Once seated with cues for deep breathing, sats increased > 90%.   Wheelchair Mobility    Modified Rankin (Stroke Patients Only)       Balance Overall balance assessment: Needs assistance Sitting-balance support: Feet supported;No upper extremity supported Sitting balance-Leahy Scale: Good     Standing balance support: During functional activity;Bilateral upper extremity supported Standing balance-Leahy Scale: Fair Standing balance comment: BUE support on RW  Cognition Arousal/Alertness: Awake/alert Behavior During Therapy: WFL for tasks assessed/performed Overall Cognitive Status: Within Functional Limits for tasks assessed                                 General  Comments: Pt alert but had difficulty recalling day of week. Follows commands accurately.      Exercises      General Comments General comments (skin integrity, edema, etc.): sats decreased to 83% after stairs. Cues for deep breathing in seated position in recliner increased sats >90% throuhgout rest of session      Pertinent Vitals/Pain Pain Assessment: No/denies pain Faces Pain Scale: No hurt    Home Living                      Prior Function            PT Goals (current goals can now be found in the care plan section) Acute Rehab PT Goals Patient Stated Goal: to go to SNF PT Goal Formulation: With patient Time For Goal Achievement: 08/14/21 Potential to Achieve Goals: Good Progress towards PT goals: Progressing toward goals    Frequency    Min 3X/week      PT Plan Discharge plan needs to be updated    Co-evaluation              AM-PAC PT "6 Clicks" Mobility   Outcome Measure  Help needed turning from your back to your side while in a flat bed without using bedrails?: A Little Help needed moving from lying on your back to sitting on the side of a flat bed without using bedrails?: A Little Help needed moving to and from a bed to a chair (including a wheelchair)?: A Little Help needed standing up from a chair using your arms (e.g., wheelchair or bedside chair)?: A Little Help needed to walk in hospital room?: A Little Help needed climbing 3-5 steps with a railing? : A Little 6 Click Score: 18    End of Session Equipment Utilized During Treatment: Gait belt Activity Tolerance: Patient tolerated treatment well;Patient limited by fatigue Patient left: with call bell/phone within reach;in chair;with chair alarm set;with nursing/sitter in room Nurse Communication: Mobility status PT Visit Diagnosis: Muscle weakness (generalized) (M62.81)     Time: 7619-5093 PT Time Calculation (min) (ACUTE ONLY): 33 min  Charges:  $Gait Training: 8-22  mins $Self Care/Home Management: 8-22                     Louie Casa, SPT Acute Rehab: [phone]     Domingo Dimes 07/31/2021, 10:47 AM

## 2021-07-31 NOTE — Progress Notes (Signed)
Oncology Nurse Navigator Documentation   Met with patient during initial consult with Dr. Isidore Moos.  He was accompanied by his wife and daughter.  Further introduced myself as his/their Navigator, explained my role as a member of the Care Team. After discussion with Dr. Isidore Moos in which she discussed all options for Christopher Santiago, he ultimately declined any treatment for his Laryngeal cancer and his wife and daughter were supportive of his decision. I have sent a message to the Palliative Care NP regarding the discussion and offered further assistance to her if needed. They verbalized understanding of information provided. I encouraged them to call with questions/concerns moving forward.  Harlow Asa, RN, BSN, OCN Head & Neck Oncology Nurse [facility] [place] [phone]

## 2021-07-31 NOTE — Progress Notes (Signed)
Occupational Therapy Treatment Patient Details Name: Christopher Santiago MRN: [id number] DOB: [birth year] Today's Date: 07/31/2021    History of present illness 87 y/o male admitted 8/9 secondary to worsening SOB, especially with exertion. Found to have acute respiratory failure with hypoxia. Imaging showed fullness of the right aryepiglottic fold extending into the right pyriform and L renal mass. Pt found to have left renal mass 8/9. Pt had Flexible laryngoscopy on 8/10 with trach. PEG 8/23. Acute course complicated by metabolic encephalopathy. PMHx:vocal cord cancer, COPD, CAD, DM, HTN, and R THA, gout, PVD, BPH.   OT comments  Patient seen by skilled OT to address HEP and standing balance/tolerance.  UE strengthening exercises performed with therapy bands and HEP to follow. Patient continues to require vcs to perform.  Standing performed from eob to address standing tolerance and balance.  Acute OT to continue to follow.   Follow Up Recommendations  CIR    Equipment Recommendations  Other (comment)    Recommendations for Other Services Rehab consult    Precautions / Restrictions Precautions Precautions: Fall Precaution Comments: trach, peg       Mobility Bed Mobility Overal bed mobility: Needs Assistance Bed Mobility: Supine to Sit;Sit to Supine     Supine to sit: Modified independent (Device/Increase time);HOB elevated Sit to supine: Modified independent (Device/Increase time);HOB elevated   General bed mobility comments: only required assistane with equipment    Transfers Overall transfer level: Needs assistance     Sit to Stand: Min guard         General transfer comment: performed standing from eob to address standing tolerance/balance    Balance Overall balance assessment: Needs assistance Sitting-balance support: Feet supported;No upper extremity supported Sitting balance-Leahy Scale: Good Sitting balance - Comments: Sat on EOB to review HEP for 10-15 minutes    Standing balance support: During functional activity;Bilateral upper extremity supported Standing balance-Leahy Scale: Fair Standing balance comment: Patient performed 3 static stands for 1 minute apiece with BUE support on RW                           ADL either performed or assessed with clinical judgement   ADL                                               Vision       Perception     Praxis      Cognition Arousal/Alertness: Awake/alert Behavior During Therapy: WFL for tasks assessed/performed Overall Cognitive Status: Within Functional Limits for tasks assessed                                 General Comments: patient was able to recall therapist from past visit        Exercises Exercises: General Lower Extremity General Exercises - Upper Extremity Shoulder Flexion: Strengthening;10 reps;Theraband Theraband Level (Shoulder Flexion): Level 2 (Red) Shoulder ABduction: Strengthening;10 reps;Both;Theraband Theraband Level (Shoulder Abduction): Level 2 (Red) Elbow Flexion: Strengthening;10 reps;Seated;Theraband Theraband Level (Elbow Flexion): Level 2 (Red) Elbow Extension: Strengthening;10 reps;Seated;Theraband Theraband Level (Elbow Extension): Level 2 (Red)   Shoulder Instructions       General Comments      Pertinent Vitals/ Pain       Pain Assessment: No/denies pain  Home Living  Prior Functioning/Environment              Frequency  Min 2X/week        Progress Toward Goals  OT Goals(current goals can now be found in the care plan section)  Progress towards OT goals: Progressing toward goals  Acute Rehab OT Goals Patient Stated Goal: to go to SNF OT Goal Formulation: With patient Time For Goal Achievement: 07/31/21 Potential to Achieve Goals: Good ADL Goals Pt Will Perform Eating: Independently Pt Will Perform Grooming: with modified  independence;standing Pt Will Perform Upper Body Dressing: Independently Pt Will Perform Lower Body Dressing: with modified independence;sit to/from stand Pt Will Transfer to Toilet: with modified independence;ambulating Pt Will Perform Toileting - Clothing Manipulation and hygiene: with modified independence;sit to/from stand Pt Will Perform Tub/Shower Transfer: Shower transfer;Tub transfer;rolling walker;shower seat Pt/caregiver will Perform Home Exercise Program: Increased ROM;Increased strength;Both right and left upper extremity  Plan Discharge plan remains appropriate;Frequency remains appropriate    Co-evaluation                 AM-PAC OT "6 Clicks" Daily Activity     Outcome Measure   Help from another person eating meals?: Total Help from another person taking care of personal grooming?: A Little Help from another person toileting, which includes using toliet, bedpan, or urinal?: A Little Help from another person bathing (including washing, rinsing, drying)?: A Little Help from another person to put on and taking off regular upper body clothing?: A Little Help from another person to put on and taking off regular lower body clothing?: A Little 6 Click Score: 16    End of Session Equipment Utilized During Treatment: Oxygen;Rolling walker  OT Visit Diagnosis: Unsteadiness on feet (R26.81);Muscle weakness (generalized) (M62.81)   Activity Tolerance Patient tolerated treatment well   Patient Left in bed;with call bell/phone within reach   Nurse Communication Mobility status        Time: 5883-2549 OT Time Calculation (min): 27 min  Charges: OT General Charges $OT Visit: 1 Visit OT Treatments $Therapeutic Activity: 8-22 mins $Therapeutic Exercise: 8-22 mins  Lodema Hong, OTA    Trixie Dredge 07/31/2021, 2:49 PM

## 2021-07-31 NOTE — Progress Notes (Signed)
Palliative Medicine RN Note: Checked in with patient following his discussion w Rad-Onc yesterday. He reports he is not afraid of dying, and he has no desire to live "in a chair with my head hanging down." He is the caregiver for his wife, and he reports his daughter is limited on the help she can give.  At this time, I do not have a good option to help him, as he states he does not have a caregiver if he goes home w hospice. I will discuss this with my team, and we will follow up tomorrow.  Marjie Skiff Aleni Andrus, RN, BSN, [facility] Palliative Medicine Team 07/31/2021 2:17 PM Office [phone]

## 2021-07-31 NOTE — TOC Progression Note (Signed)
Transition of Care [facility]) - Progression Note    Patient Details  Name: Christopher Santiago MRN: [id number] Date of Birth: February 25, 1931  Transition of Care [facility] CM/SW Contact  [name], LCSW Phone Number: 07/31/2021, 4:09 PM  Clinical Narrative:   CSW met with pt for update.  Pt worried that he will be sent home in several days "no matter what" and that he would not be ready to be on his own.  CSW reassured pt that we are still working on SNF placement and he will not be discharged without a safe plan in place.  Pt acknowledged that he is too weak to care for his wife at this time, asked CSW to speak with his daughter about plans as well.      Expected Discharge Plan: [place] Barriers to Discharge: Continued Medical Work up, SNF Pending bed offer, Other (must enter comment) (pt has new trach placed on 07/02/21)  Expected Discharge Plan and Services Expected Discharge Plan: [place] Choice: [place] arrangements for the past 2 months: Single Family Home                                       Social Determinants of Health (SDOH) Interventions    Readmission Risk Interventions No flowsheet data found.

## 2021-07-31 NOTE — Progress Notes (Signed)
PROGRESS NOTE    Christopher Santiago  JQB:[id number] DOB: 05/07/1931 DOA: 07/01/2021 PCP: Tonia Ghent, MD   Brief Narrative:  The patient is an 87 year old elderly Caucasian male with a past medical history significant for but not limited to COPD who quit smoking 30 years ago, history of vocal cord cancer, diabetes mellitus type 2 as well as other comorbidities who presented with exertional dyspnea, reports sinusitis and postnasal drip and hoarseness for the last 3 to 4 months.  Is also complaining of difficulty swallowing of food getting stuck in his throat and he also reported phlegm in his throat.  CT maxillofacial soft tissue neck was done and showed asymmetric soft tissue density and fullness at the base of the right aryepiglottic fold extending right piriform sinus with possible involvement of the right glottis inferiorly.  Imaging studies was not able to determine if there is a discrete mass.  ENT was consulted for direct visualization and patient underwent laryngoscopy with biopsy and tracheostomy and he is also Status post PEG tube placement.  Further work-up revealed that he had squamous cell carcinoma of the larynx.  He has been worked up for [facility] [employer] but has been refused for inpatient rehabilitation.  SNF is being pursued and currently has a appointment with radiation oncology for a squamous cell carcinoma of the larynx however patient has decided not to pursue radiation treatment.  SLP continues to recommend n.p.o. and PT OT still recommending SNF with supervision assistance and a rolling walker with 5 inch wheels  Assessment & Plan:   Principal Problem:   Acute respiratory failure with hypoxia ([place]) Active Problems:   Essential hypertension   COPD with acute exacerbation ([place])   Left renal mass   Respiratory failure, acute ([place])   Protein-calorie malnutrition, severe   Tracheostomy status (HCC)   Tracheobronchitis   Dementia ([place])  Acute Hypoxic Respiratory Failure, tracheostomy in  place -Suspect related to component of aspiration due to dysphagia larynx CA, COPD exacerbation.   -He has completed 5 days of IV steroids  -Completed 7 days of cefepime with last day 8/21 -Respiratory culture with normal respiratory flora -ENT has been managing trach care -Patient is tolerating PM Valve most of the day, where his speech is fluent and very understandable. -SpO2: 100 % O2 Flow Rate (L/min): 5 L/min FiO2 (%): 21 % -Continue with scopolamine patch for secretions. -Continues to have secretions despite scopolamine patch.  Continues to require Suctioning -Repeat a chest x-ray 07/29/21 showed "Question atelectasis or infiltrate at the left base. Otherwise no acute cardiopulmonary  findings. Stable right upper lobe calcified granuloma." -Seems to be stable for the most part.  He is on trach collar with saturations in the 90s.     Sinus bradycardia/pauses -Noted to have sinus bradycardia on 9/3.   -Patient was completely asymptomatic.  Noted to have pauses on telemetry but this is a chronic.  Not noted to be on any AV blocking medications. Thyroid function tests are normal.  Electrolytes have been normal.  Bradycardia could have been due to excessive suctioning from a vagal response. Heart rate has been stable for the last 48 hours.  Continue to monitor.   Squamous cell carcinoma Larynx -CT showed asymmetric soft tissue density fullness at the base of the right aryepiglottic fold. ENT was consulted. Patient underwent laryngoscopy with biopsy and tracheostomy 8/11. Stable on ATC 5 L oxygen.  -Biopsy showed squamous cell carcinoma with foci suspicious for invasion.  -Per ENT note when he was seen and  evaluated initially, he was discussed at tumor board, will have outpatient follow-up with medical oncology, radiation oncology.  CT scan of the neck and chest showing that the pulmonary nodules have not progressed since 2019, therefore benign -Trach changed to Shiley 6 Prox XLT Cuffless.   Management per ENT -Patient to be followed at cancer center.  Patient's family is supposed to meet with radiation oncologist today and this was done at 1 PM -Per my discussion with social work cannot safely discharge the patient with his fresh tracheostomy as a will be difficult to find him placement -Palliative care has been consulted for further goals of care discussion after family discussion the family will continue to monitor for his quality of life and hospice has been discussed in the event that the patient does not tolerate cancer treatment or to not to proceed with a cancer therapy after meeting with oncology; patient no longer wants to proceed with radiation therapy -He wants to go home however he does not have a caregiver himself if he goes home with hospice given that his wife is sick and that he is a caregiver for his wife palliative to follow-up for goals of care discussion: Patient has acknowledged that he is too weak to care for his wife at this time and case management still working on SNF placement; he cannot safely be discharged without a safe plan in place -Patient and family to meet with palliative at 1 PM   Acute metabolic encephalopathy -Had significant delirium initially but this appears to have resolved.  -Seems to be back to baseline.   1.2 cm spiculated right upper lobe nodule -Outpatient follow up with pulmonary and medical oncology if the patient desires    Left Renal mass  -Concerning for renal carcinoma, will need to follow-up with urology as an outpatient.   -These findings were communicated to patient's daughter by previous providers.   Diabetes mellitus type II, controlled -HbA1c 5.8.  Monitor CBGs.  SSI.-CBGs ranging from 98-143   Essential Hypertension  -Blood pressure stable.  Noted to be on amlodipine. -Continue to monitor blood pressures per protocol -Last blood pressure reading was 140/73   Normocytic anemia -No evidence of overt bleeding.  Hemoglobin  remained stable. -Last hemoglobin/hematocrit was stable 10.3/31.8 -Continue monitor for signs and symptoms of bleeding; currently no overt bleeding noted   Leukocytosis  -Probably related to steroids vs infection.  Resolved on last check and it was 9.0 on 07/28/2021 and today it is 7.8 -Continue to Monitor and Trend intermittently   Severe protein calorie malnutrition -Nutritionist consulted for further evaluation recommendations -Nutritional services recommending continuing tube feedings via PEG with Osmolite 1.5 at 55 MLS per hour and recommending Prosource tube feedings at 45 mL twice daily and 150 mL free water flushes every 4 hours  DVT prophylaxis: SCDs Code Status: DO NOT RESUSCITATE Family Communication: No family present at bedside Disposition Plan: Original plan was for the patient to go to SNF for now PT OT recommended recommendation for home health; may be able to send him home pending on his clinical improvement but he is too weak to go home so we will still go to SNF as he does not have a caregiver at home  Status is: Inpatient  Remains inpatient appropriate because:Unsafe d/c plan, IV treatments appropriate due to intensity of illness or inability to take PO, and Inpatient level of care appropriate due to severity of illness  Dispo: The patient is from: Home  Anticipated d/c is to: SNF              Patient currently is not medically stable to d/c.   Difficult to place patient No  Consultants:  ENT Trauma surgery for PEG Palliaitve  PCCM  Procedures:  Laryngoscopy / trach  ECHOCARDIOGRAM IMPRESSIONS   1. Technically difficult study, very poor visualization of left ventricle even after contrast administration. Left ventricular ejection fraction, by estimation, is 55 to 60%. The left ventricle has grossly normal function but very poorly visualized. Left ventricular endocardial border not optimally defined to evaluate regional wall motion. Left ventricular  diastolic parameters are indeterminate.   2. Right ventricular systolic function is normal. The right ventricular size is mildly enlarged. Tricuspid regurgitation signal is inadequate for assessing PA pressure.   3. Right atrial size was moderately dilated.   4. A small pericardial effusion is present. The pericardial effusion is anterior to the right ventricle. Presence of pericardial fat pad.   5. The mitral valve is grossly normal. No evidence of mitral valve regurgitation.   6. The aortic valve was not well visualized. Aortic valve regurgitation is not visualized. No aortic stenosis is present.   7. The inferior vena cava is normal in size with greater than 50% respiratory variability, suggesting right atrial pressure of 3 mmHg.   Antimicrobials:  Anti-infectives (From admission, onward)    Start     Dose/Rate Route Frequency Ordered Stop   07/12/21 2200  ceFEPIme (MAXIPIME) 2 g in sodium chloride 0.9 % 100 mL IVPB        2 g 200 mL/hr over 30 Minutes Intravenous Every 12 hours 07/12/21 1555 07/14/21 2359   07/10/21 2200  ceFEPIme (MAXIPIME) 2 g in sodium chloride 0.9 % 100 mL IVPB  Status:  Discontinued        2 g 200 mL/hr over 30 Minutes Intravenous Every 8 hours 07/10/21 1316 07/12/21 1555   07/08/21 0800  vancomycin (VANCOCIN) IVPB 1000 mg/200 mL premix  Status:  Discontinued        1,000 mg 200 mL/hr over 60 Minutes Intravenous Every 24 hours 07/07/21 0747 07/09/21 0855   07/07/21 0900  vancomycin (VANCOREADY) IVPB 1250 mg/250 mL        1,250 mg 166.7 mL/hr over 90 Minutes Intravenous  Once 07/07/21 0747 07/07/21 1412   07/07/21 0900  ceFEPIme (MAXIPIME) 2 g in sodium chloride 0.9 % 100 mL IVPB  Status:  Discontinued        2 g 200 mL/hr over 30 Minutes Intravenous Every 12 hours 07/07/21 0747 07/10/21 1316   07/07/21 0800  cefTRIAXone (ROCEPHIN) 1 g in sodium chloride 0.9 % 100 mL IVPB  Status:  Discontinued        1 g 200 mL/hr over 30 Minutes Intravenous Every 24 hours  07/07/21 0707 07/07/21 0709   07/06/21 1430  fluconazole (DIFLUCAN) IVPB 100 mg  Status:  Discontinued        100 mg 50 mL/hr over 60 Minutes Intravenous Every 24 hours 07/06/21 1341 07/06/21 1404   07/02/21 2100  cefTRIAXone (ROCEPHIN) 1 g in sodium chloride 0.9 % 100 mL IVPB        1 g 200 mL/hr over 30 Minutes Intravenous Every 24 hours 07/02/21 0114 07/06/21 2325   07/01/21 2145  cefTRIAXone (ROCEPHIN) 1 g in sodium chloride 0.9 % 100 mL IVPB        1 g 200 mL/hr over 30 Minutes Intravenous  Once 07/01/21 2141 07/01/21 2235  07/01/21 2145  azithromycin (ZITHROMAX) 500 mg in sodium chloride 0.9 % 250 mL IVPB        500 mg 250 mL/hr over 60 Minutes Intravenous  Once 07/01/21 2141 07/02/21 0000        Subjective: Seen and examined at bedside and and he was doing okay and denied any pain but states that he had to frequently suction himself.  Had a meeting with the radiation oncologist yesterday and he has refused any radiation treatment at all.  Wants to go home to be with his wife however is remained significantly weak.  No chest pain or shortness of breath.  No other concerns or complaints at this time.  Objective: Vitals:   07/31/21 0811 07/31/21 1123 07/31/21 1145 07/31/21 1615  BP:   126/70   Pulse: 68 81 73 86  Resp: 20 (!) 21 19 (!) 23  Temp:   98.3 F (36.8 C)   TempSrc:   Oral   SpO2: 100% 100% 100% 100%  Weight:      Height:        Intake/Output Summary (Last 24 hours) at 07/31/2021 1623 Last data filed at 07/31/2021 1007 Gross per 24 hour  Intake --  Output 1050 ml  Net -1050 ml    Filed Weights   07/27/21 0334 07/28/21 0300 07/29/21 0327  Weight: 73.5 kg 71.2 kg 71.2 kg   Examination: Physical Exam:  Constitutional: Thin Caucasian male currently in no acute distress with tracheostomy stressed and PEG tube in place Eyes:  Lids and conjunctivae normal, sclerae anicteric  ENMT: External Ears, Nose appear normal. Grossly normal hearing.  Neck: Has a  tracheostomy in place and connected to ATC Respiratory: Diminished to auscultation bilaterally with coarse breath sounds, no wheezing, rales, rhonchi or crackles. Normal respiratory effort and patient is not tachypenic. No accessory muscle use. Wearing ATC Cardiovascular: RRR, no murmurs / rubs / gallops. S1 and S2 auscultated. No extremity edema.  Abdomen: Soft, non-tender, Distended. Has a PEG in place. Bowel sounds positive.  GU: Deferred. Musculoskeletal: No clubbing / cyanosis of digits/nails. No joint deformity upper and lower extremities. Skin: No rashes, lesions, ulcers on limited skin evaluation. No induration; Warm and dry.  Neurologic: CN 2-12 grossly intact with no focal deficits. Romberg sign and cerebellar reflexes not assessed.  Psychiatric: Normal judgment and insight. Alert and oriented x 3. Normal mood and appropriate affect.   Data Reviewed: I have personally reviewed following labs and imaging studies  CBC: Recent Labs  Lab 07/28/21 0142 07/31/21 0347  WBC 9.0 7.8  NEUTROABS  --  5.4  HGB 10.3* 10.3*  HCT 32.6* 31.8*  MCV 94.5 93.8  PLT 200 121    Basic Metabolic Panel: Recent Labs  Lab 07/28/21 0142 07/31/21 0347  NA 136 136  K 4.5 4.2  CL 100 102  CO2 28 27  GLUCOSE 147* 156*  BUN 31* 40*  CREATININE 0.75 0.84  CALCIUM 9.5 9.0  MG 2.3 2.2  PHOS  --  3.8    GFR: Estimated Creatinine Clearance: 54.9 mL/min (by C-G formula based on SCr of 0.84 mg/dL). Liver Function Tests: Recent Labs  Lab 07/31/21 0347  AST 17  ALT 24  ALKPHOS 67  BILITOT 0.6  PROT 6.4*  ALBUMIN 2.5*   No results for input(s): LIPASE, AMYLASE in the last 168 hours. No results for input(s): AMMONIA in the last 168 hours. Coagulation Profile: No results for input(s): INR, PROTIME in the last 168 hours. Cardiac Enzymes: No  results for input(s): CKTOTAL, CKMB, CKMBINDEX, TROPONINI in the last 168 hours. BNP (last 3 results) Recent Labs    11/08/20 1106  PROBNP 482.0*     HbA1C: No results for input(s): HGBA1C in the last 72 hours. CBG: Recent Labs  Lab 07/31/21 0015 07/31/21 0341 07/31/21 0737 07/31/21 1144 07/31/21 1543  GLUCAP 120* 143* 137* 98 139*    Lipid Profile: No results for input(s): CHOL, HDL, LDLCALC, TRIG, CHOLHDL, LDLDIRECT in the last 72 hours. Thyroid Function Tests: No results for input(s): TSH, T4TOTAL, FREET4, T3FREE, THYROIDAB in the last 72 hours. Anemia Panel: No results for input(s): VITAMINB12, FOLATE, FERRITIN, TIBC, IRON, RETICCTPCT in the last 72 hours. Sepsis Labs: No results for input(s): PROCALCITON, LATICACIDVEN in the last 168 hours.  No results found for this or any previous visit (from the past 240 hour(s)).   RN Pressure Injury Documentation:    Estimated body mass index is 25.34 kg/m as calculated from the following:   Height as of this encounter: 5' 6"  (1.676 m).   Weight as of this encounter: 71.2 kg.  Malnutrition Type:  Nutrition Problem: Severe Malnutrition Etiology: chronic illness (COPD)  Malnutrition Characteristics:  Signs/Symptoms: severe fat depletion, severe muscle depletion  Nutrition Interventions:  Interventions: Refer to RD note for recommendations     Radiology Studies: No results found.  Scheduled Meds:  acetaminophen  500 mg Per Tube TID   allopurinol  100 mg Oral Daily   amLODipine  5 mg Oral Daily   arformoterol  15 mcg Nebulization BID   budesonide (PULMICORT) nebulizer solution  0.25 mg Nebulization BID   chlorhexidine gluconate (MEDLINE KIT)  15 mL Mouth Rinse BID   Chlorhexidine Gluconate Cloth  6 each Topical Daily   feeding supplement (PROSource TF)  45 mL Per Tube BID   free water  150 mL Per Tube Q4H   guaiFENesin  10 mL Oral Q8H   insulin aspart  0-9 Units Subcutaneous Q4H   lidocaine  1 application Urethral Once   mouth rinse  15 mL Mouth Rinse 10 times per day   nystatin  5 mL Oral QID   revefenacin  175 mcg Nebulization Daily   scopolamine  1  patch Transdermal Q72H   sennosides  5 mL Oral BID   sodium chloride flush  10-40 mL Intracatheter Q12H   Continuous Infusions:  feeding supplement (OSMOLITE 1.5 CAL) 1,000 mL (07/29/21 2159)    LOS: 30 days   Kerney Elbe, DO [facility] PAGER is on AMION  If 7PM-7AM, please contact night-coverage [email or web address]

## 2021-07-31 NOTE — Progress Notes (Signed)
Patient's wife and daughter will be here tomorrow at 1:00 pm to speak with palliative care team.

## 2021-07-31 NOTE — Progress Notes (Addendum)
  Speech Language Pathology Treatment:    Patient Details Name: Christopher Santiago MRN: [id number] DOB: 1931/05/05 Today's Date: 07/31/2021 Time: 4098-1191 SLP Time Calculation (min) (ACUTE ONLY): 36 min  Assessment / Plan / Recommendation Clinical Impression  Pt was seen for treatment. He was alert and cooperative during the session. EMR shows that pt has declined treatment after meeting with the radiation oncologist on 9/7. Pt's family has brought a Geologist, engineering and he was able to use the mirror to don and doff PMSV with minimal cues for trach stabilization. Pt's PMSV was on for the duration of the session and he tolerated it with stable vitals and no respiratory distress. Pt reported that he becomes hoarse after continued speech. He was educated regarding his impaired ability to adequately coordinate respiration with speech. He demonstrated adequate coordination at the phrase level with 40% accuracy increasing to 90% with verbal prompts. Some generalization of this was noted during conversation. Pt was educated regarding PMSV care and methods for cleaning. He verbalized understanding, but reinforcement will be needed. P.o. trials were given to assess pt's ability to have anything p.o. for comfort and to maintain swallowing musculature if pt subsequently decides to pursue radiation treatment. Pt demonstrated significant coughing with small boluses of ice chips and with 1/2 tsp boluses of honey thick liquids. SLP does not anticipate that this would bring the pt any comfort if that becomes the [place]. SLP will continue to follow pt.    HPI HPI: Pt is an 87 y/o male admitted 8/10 secondary to worsening SOB, especially with exertion. Found to have acute respiratory failure with hypoxia. Imaging showed fullness of the right aryepiglottic fold extending into the right pyriform and L renal mass. Pt had flexible laryngoscopy on 8/10, including trach.  Findings revealed bilateral true vocal folds in paramedian position with  normal adduction, restricted abduction causing narrowing of the airway. No evidence of airway obstruction. Mucosal abnormality/ edema of right supraglottis/glottis noted with no exophytic mass lesion, biopsy showed squamous cell carcinoma. PEG placement 07/15/21.  PMH includes vocal cord cancer ~ 30 years ago, COPD, CAD, DM, HTN, R THA, bilateral hearing aids.      SLP Plan  Continue with current plan of care       Recommendations  Diet recommendations: NPO Medication Administration: Via alternative means      Patient may use Passy-Muir Speech Valve: During all waking hours (remove during sleep) PMSV Supervision: Intermittent MD: Please consider changing trach tube to : Smaller size         Oral Care Recommendations: Oral care QID Follow up Recommendations: Skilled Nursing facility SLP Visit Diagnosis: Aphonia (R49.1);Dysphagia, unspecified (R13.10) Plan: Continue with current plan of care       Aili Casillas I. Hardin Negus, [place], [place] Office number [phone] Pager [place] 07/31/2021, 10:00 AM

## 2021-07-31 NOTE — Progress Notes (Signed)
Nutrition Follow-up  DOCUMENTATION CODES:  Severe malnutrition in context of chronic illness  INTERVENTION:  Continue TF via PEG: -Osmolite 1.5 cal @ 55 ml/hr (1320 ml/day) -ProSource TF 45 ml BID -154m free water flushes Q4H   Provides 2060 kcal, 105 grams of protein, and 1006 ml of H2O (1906 ml free water total with flushes)  NUTRITION DIAGNOSIS:  Severe Malnutrition related to chronic illness (COPD) as evidenced by severe fat depletion, severe muscle depletion. - ongoing  GOAL:  Patient will meet greater than or equal to 90% of their needs - met with TF  MONITOR:  Diet advancement, Labs, Weight trends, Skin, I & O's  REASON FOR ASSESSMENT:  Consult Assessment of nutrition requirement/status  ASSESSMENT:  87year old male who presented to the ED on 8/09 with SOB and dysphagia. PMH of COPD, CAD, depression, DM, HTN, HLD, laryngeal cancer treated ~30 years ago, OSA.  8/10 - s/p laryngeal biopsy and tracheostomy 8/12 - Cortrak placed, tip of tube in stomach 8/15 - trach exchanged to #6 XLT-P uncuffed 8/20 - Cortrak pulled by patient 8/22 - Cortrak replaced (gastric tip) 8/23 - PEG placed; trach exchanged (still #6 XLT-P uncuffed)  Further work-up revealed pt had squamous cell carcinoma of the larynx. Pt was refused for CIR and SNF is now being pursued. Pt currently has an appointment with radiation oncology for squamous cell carcinoma of the larynx. Pt remains NPO w/ SLP still following.Continues to tolerate TF via PEG per RN. Current TF regimen: Osmolite 1.5 cal @ 55 ml/hr (1320 ml/day) w/ ProSource TF 45 ml BID and 1539mfree water flushes Q4H. This provides 2060 kcal, 105 grams of protein, and 1006 ml of H2O (1906 ml free water total with flushes).   UOP: [id number] x24 hours I/O: +2032m13mnce admit  Admit weight: 70.8 kg Current weight: 71.2 kg   Medications: SSI, Senokot Labs reviewed. CBGs: 71-143 over 24 hours  Diet Order:   Diet Order              Diet NPO time specified Except for: Ice Chips  Diet effective now                  EDUCATION NEEDS:  No education needs have been identified at this time  Skin:  Skin Assessment: Skin Integrity Issues: Skin Integrity Issues:: Incisions Incisions: neck, abdomen  Last BM:  9/5 type 6  Height:  Ht Readings from Last 1 Encounters:  07/02/21 5' 6"  (1.676 m)   Weight:  Wt Readings from Last 1 Encounters:  07/29/21 71.2 kg   BMI:  Body mass index is 25.34 kg/m.  Estimated Nutritional Needs:  Kcal:  2000-2200 kcal Protein:  95-115 grams Fluid:  >/= 2 L/day   AmanLarkin Ina, RD, LDN (she/her/hers) RD pager number and weekend/on-call pager number located in [place]

## 2021-08-01 ENCOUNTER — Inpatient Hospital Stay (HOSPITAL_COMMUNITY): Payer: Medicare Other

## 2021-08-01 DIAGNOSIS — Z515 Encounter for palliative care: Secondary | ICD-10-CM | POA: Diagnosis not present

## 2021-08-01 DIAGNOSIS — F0390 Unspecified dementia without behavioral disturbance: Secondary | ICD-10-CM | POA: Diagnosis not present

## 2021-08-01 DIAGNOSIS — Z66 Do not resuscitate: Secondary | ICD-10-CM | POA: Diagnosis not present

## 2021-08-01 DIAGNOSIS — I10 Essential (primary) hypertension: Secondary | ICD-10-CM | POA: Diagnosis not present

## 2021-08-01 DIAGNOSIS — Z7189 Other specified counseling: Secondary | ICD-10-CM | POA: Diagnosis not present

## 2021-08-01 DIAGNOSIS — I455 Other specified heart block: Secondary | ICD-10-CM

## 2021-08-01 DIAGNOSIS — J9601 Acute respiratory failure with hypoxia: Secondary | ICD-10-CM | POA: Diagnosis not present

## 2021-08-01 DIAGNOSIS — J441 Chronic obstructive pulmonary disease with (acute) exacerbation: Secondary | ICD-10-CM | POA: Diagnosis not present

## 2021-08-01 LAB — CBC WITH DIFFERENTIAL/PLATELET
Abs Immature Granulocytes: 0.04 10*3/uL (ref 0.00–0.07)
Basophils Absolute: 0 10*3/uL (ref 0.0–0.1)
Basophils Relative: 0 %
Eosinophils Absolute: 0.2 10*3/uL (ref 0.0–0.5)
Eosinophils Relative: 3 %
HCT: 31.9 % — ABNORMAL LOW (ref 39.0–52.0)
Hemoglobin: 9.9 g/dL — ABNORMAL LOW (ref 13.0–17.0)
Immature Granulocytes: 0 %
Lymphocytes Relative: 11 %
Lymphs Abs: 1 10*3/uL (ref 0.7–4.0)
MCH: 29.6 pg (ref 26.0–34.0)
MCHC: 31 g/dL (ref 30.0–36.0)
MCV: 95.2 fL (ref 80.0–100.0)
Monocytes Absolute: 0.9 10*3/uL (ref 0.1–1.0)
Monocytes Relative: 10 %
Neutro Abs: 6.7 10*3/uL (ref 1.7–7.7)
Neutrophils Relative %: 76 %
Platelets: 187 10*3/uL (ref 150–400)
RBC: 3.35 MIL/uL — ABNORMAL LOW (ref 4.22–5.81)
RDW: 13.6 % (ref 11.5–15.5)
WBC: 8.9 10*3/uL (ref 4.0–10.5)
nRBC: 0 % (ref 0.0–0.2)

## 2021-08-01 LAB — GLUCOSE, CAPILLARY
Glucose-Capillary: 157 mg/dL — ABNORMAL HIGH (ref 70–99)
Glucose-Capillary: 162 mg/dL — ABNORMAL HIGH (ref 70–99)
Glucose-Capillary: 136 mg/dL — ABNORMAL HIGH (ref 70–99)
Glucose-Capillary: 135 mg/dL — ABNORMAL HIGH (ref 70–99)
Glucose-Capillary: 139 mg/dL — ABNORMAL HIGH (ref 70–99)
Glucose-Capillary: 95 mg/dL (ref 70–99)

## 2021-08-01 LAB — COMPREHENSIVE METABOLIC PANEL
ALT: 20 U/L (ref 0–44)
AST: 16 U/L (ref 15–41)
Albumin: 2.6 g/dL — ABNORMAL LOW (ref 3.5–5.0)
Alkaline Phosphatase: 67 U/L (ref 38–126)
Anion gap: 6 (ref 5–15)
BUN: 33 mg/dL — ABNORMAL HIGH (ref 8–23)
CO2: 28 mmol/L (ref 22–32)
Calcium: 9.2 mg/dL (ref 8.9–10.3)
Chloride: 102 mmol/L (ref 98–111)
Creatinine, Ser: 0.73 mg/dL (ref 0.61–1.24)
GFR, Estimated: >60 mL/min (ref >60)
Glucose, Bld: 130 mg/dL — ABNORMAL HIGH (ref 70–99)
Potassium: 4.2 mmol/L (ref 3.5–5.1)
Sodium: 136 mmol/L (ref 135–145)
Total Bilirubin: 0.6 mg/dL (ref 0.3–1.2)
Total Protein: 6.5 g/dL (ref 6.5–8.1)

## 2021-08-01 LAB — PHOSPHORUS: Phosphorus: 3.7 mg/dL (ref 2.5–4.6)

## 2021-08-01 LAB — MAGNESIUM: Magnesium: 2.2 mg/dL (ref 1.7–2.4)

## 2021-08-01 NOTE — Progress Notes (Signed)
PROGRESS NOTE    Christopher Santiago  YWV:[id number] DOB: [birth year] DOA: 07/01/2021 PCP: Tonia Ghent, MD   Brief Narrative:  The patient is an 87 year old elderly Caucasian male with a past medical history significant for but not limited to COPD who quit smoking 30 years ago, history of vocal cord cancer, diabetes mellitus type 2 as well as other comorbidities who presented with exertional dyspnea, reports sinusitis and postnasal drip and hoarseness for the last 3 to 4 months.  Is also complaining of difficulty swallowing of food getting stuck in his throat and he also reported phlegm in his throat.  CT maxillofacial soft tissue neck was done and showed asymmetric soft tissue density and fullness at the base of the right aryepiglottic fold extending right piriform sinus with possible involvement of the right glottis inferiorly.  Imaging studies was not able to determine if there is a discrete mass.  ENT was consulted for direct visualization and patient underwent laryngoscopy with biopsy and tracheostomy and he is also Status post PEG tube placement.  Further work-up revealed that he had squamous cell carcinoma of the larynx.  He has been worked up for [facility] but has been refused for inpatient rehabilitation.  SNF is being pursued and currently has a appointment with radiation oncology for a squamous cell carcinoma of the larynx however patient has decided not to pursue radiation treatment.  SLP continues to recommend n.p.o. and PT OT still recommending SNF with supervision assistance and a rolling walker with 5 inch wheels. Patient and Family to meet with Palliative Care at 1300 today to discuss further goals of care. This AM he had two Sinus pauses of 3.5 seconds so Cardiology was consulted for further evaluation and recommendations  Assessment & Plan:   Principal Problem:   Acute respiratory failure with hypoxia ([place]) Active Problems:   Essential hypertension   COPD with acute exacerbation ([facility])    Left renal mass   Respiratory failure, acute ([place])   Protein-calorie malnutrition, severe   Tracheostomy status ([place])   Tracheobronchitis   Dementia ([place])  Acute Hypoxic Respiratory Failure, tracheostomy in place -Suspect related to component of aspiration due to dysphagia larynx CA, COPD exacerbation.   -He has completed 5 days of IV steroids  -Completed 7 days of cefepime with last day 8/21 -Respiratory culture with normal respiratory flora -ENT has been managing trach care -Patient is tolerating PM Valve most of the day, where his speech is fluent and very understandable. -SpO2: 100 % O2 Flow Rate (L/min): 5 L/min FiO2 (%): 21 % -Continue with scopolamine patch for secretions. -Continues to have secretions despite scopolamine patch.  Continues to require Suctioning -Repeat a chest x-ray 07/29/21 showed "Question atelectasis or infiltrate at the left base. Otherwise no acute cardiopulmonary  findings. Stable right upper lobe calcified granuloma." -Seems to be stable for the most part.  He is on trach collar with saturations in the 90s but has been stable.    Sinus bradycardia/pauses -Noted to have sinus bradycardia on 9/3.   -Patient was completely asymptomatic.  Noted to have pauses on telemetry but this is a chronic.  Not noted to be on any AV blocking medications. Thyroid function tests are normal.  Electrolytes have been normal.  Bradycardia could have been due to excessive suctioning from a vagal response. Heart rate has been stable for the last 48 hours.  Continue to monitor.   Squamous cell carcinoma Larynx -CT showed asymmetric soft tissue density fullness at the base of the right  aryepiglottic fold. ENT was consulted. Patient underwent laryngoscopy with biopsy and tracheostomy 8/11. Stable on ATC 5 L oxygen.  -Biopsy showed squamous cell carcinoma with foci suspicious for invasion.  -Per ENT note when he was seen and evaluated initially, he was discussed at tumor board, will  have outpatient follow-up with medical oncology, radiation oncology.  CT scan of the neck and chest showing that the pulmonary nodules have not progressed since 2019, therefore benign -Trach changed to Shiley 6 Prox XLT Cuffless.  Management per ENT -Patient to be followed at cancer center.  Patient's family is supposed to meet with radiation oncologist today and this was done at 1 PM -Per my discussion with social work cannot safely discharge the patient with his fresh tracheostomy as a will be difficult to find him placement -Palliative care has been consulted for further goals of care discussion after family discussion the family will continue to monitor for his quality of life and hospice has been discussed in the event that the patient does not tolerate cancer treatment or to not to proceed with a cancer therapy after meeting with oncology; patient no longer wants to proceed with radiation therapy -He wants to go home however he does not have a caregiver himself if he goes home with hospice given that his wife is sick and that he is a caregiver for his wife palliative to follow-up for goals of care discussion: Patient has acknowledged that he is too weak to care for his wife at this time and case management still working on SNF placement; he cannot safely be discharged without a safe plan in place -Patient and family to meet with palliative at 1 PM today    Acute metabolic encephalopathy -Had significant delirium initially but this appears to have resolved.  -Seems to be back to baseline.   1.2 cm spiculated right upper lobe nodule -Outpatient follow up with pulmonary and medical oncology if the patient desires    Left Renal mass  -Concerning for renal carcinoma, will need to follow-up with urology as an outpatient.   -These findings were communicated to patient's daughter by previous providers.   Diabetes mellitus type II, controlled -HbA1c 5.8.  Monitor CBGs.  SSI.-CBGs ranging from  98-162   Essential Hypertension  -Blood pressure stable.  Noted to be on amlodipine. -Continue to monitor blood pressures per protocol -Last blood pressure reading was 140/73  Sinus Pauses -Had two 3.5 second pauses -Cardiology consulted for further evaluation -Unsure if he would want a Pacer   Normocytic anemia -No evidence of overt bleeding.  Hemoglobin remained stable. -Last hemoglobin/hematocrit was stable at  10.3/31.8 and dropped to 9.9/31.9 -Continue monitor for signs and symptoms of bleeding; currently no overt bleeding noted   Leukocytosis  -Probably related to steroids vs infection.  -Resolved and it is 8.9 now -Continue to Monitor and Trend intermittently   Severe protein calorie malnutrition -Nutritionist consulted for further evaluation recommendations -Nutritional services recommending continuing tube feedings via PEG with Osmolite 1.5 at 55 MLS per hour and recommending Prosource tube feedings at 45 mL twice daily and 150 mL free water flushes every 4 hours  DVT prophylaxis: SCDs Code Status: DO NOT RESUSCITATE Family Communication: No family present at bedside Disposition Plan: Original plan was for the patient to go to SNF for now PT OT recommended recommendation for home health; may be able to send him home pending on his clinical improvement but he is too weak to go home so we will still go to  SNF as he does not have a caregiver at home  Status is: Inpatient  Remains inpatient appropriate because:Unsafe d/c plan, IV treatments appropriate due to intensity of illness or inability to take PO, and Inpatient level of care appropriate due to severity of illness  Dispo: The patient is from: Home              Anticipated d/c is to: SNF              Patient currently is not medically stable to d/c.   Difficult to place patient No  Consultants:  ENT Trauma surgery for PEG Palliaitve  PCCM  Procedures:  Laryngoscopy / trach  ECHOCARDIOGRAM IMPRESSIONS   1.  Technically difficult study, very poor visualization of left ventricle even after contrast administration. Left ventricular ejection fraction, by estimation, is 55 to 60%. The left ventricle has grossly normal function but very poorly visualized. Left ventricular endocardial border not optimally defined to evaluate regional wall motion. Left ventricular diastolic parameters are indeterminate.   2. Right ventricular systolic function is normal. The right ventricular size is mildly enlarged. Tricuspid regurgitation signal is inadequate for assessing PA pressure.   3. Right atrial size was moderately dilated.   4. A small pericardial effusion is present. The pericardial effusion is anterior to the right ventricle. Presence of pericardial fat pad.   5. The mitral valve is grossly normal. No evidence of mitral valve regurgitation.   6. The aortic valve was not well visualized. Aortic valve regurgitation is not visualized. No aortic stenosis is present.   7. The inferior vena cava is normal in size with greater than 50% respiratory variability, suggesting right atrial pressure of 3 mmHg.   Antimicrobials:  Anti-infectives (From admission, onward)    Start     Dose/Rate Route Frequency Ordered Stop   07/12/21 2200  ceFEPIme (MAXIPIME) 2 g in sodium chloride 0.9 % 100 mL IVPB        2 g 200 mL/hr over 30 Minutes Intravenous Every 12 hours 07/12/21 1555 07/14/21 2359   07/10/21 2200  ceFEPIme (MAXIPIME) 2 g in sodium chloride 0.9 % 100 mL IVPB  Status:  Discontinued        2 g 200 mL/hr over 30 Minutes Intravenous Every 8 hours 07/10/21 1316 07/12/21 1555   07/08/21 0800  vancomycin (VANCOCIN) IVPB 1000 mg/200 mL premix  Status:  Discontinued        1,000 mg 200 mL/hr over 60 Minutes Intravenous Every 24 hours 07/07/21 0747 07/09/21 0855   07/07/21 0900  vancomycin (VANCOREADY) IVPB 1250 mg/250 mL        1,250 mg 166.7 mL/hr over 90 Minutes Intravenous  Once 07/07/21 0747 07/07/21 1412   07/07/21 0900   ceFEPIme (MAXIPIME) 2 g in sodium chloride 0.9 % 100 mL IVPB  Status:  Discontinued        2 g 200 mL/hr over 30 Minutes Intravenous Every 12 hours 07/07/21 0747 07/10/21 1316   07/07/21 0800  cefTRIAXone (ROCEPHIN) 1 g in sodium chloride 0.9 % 100 mL IVPB  Status:  Discontinued        1 g 200 mL/hr over 30 Minutes Intravenous Every 24 hours 07/07/21 0707 07/07/21 0709   07/06/21 1430  fluconazole (DIFLUCAN) IVPB 100 mg  Status:  Discontinued        100 mg 50 mL/hr over 60 Minutes Intravenous Every 24 hours 07/06/21 1341 07/06/21 1404   07/02/21 2100  cefTRIAXone (ROCEPHIN) 1 g in sodium chloride 0.9 %  100 mL IVPB        1 g 200 mL/hr over 30 Minutes Intravenous Every 24 hours 07/02/21 0114 07/06/21 2325   07/01/21 2145  cefTRIAXone (ROCEPHIN) 1 g in sodium chloride 0.9 % 100 mL IVPB        1 g 200 mL/hr over 30 Minutes Intravenous  Once 07/01/21 2141 07/01/21 2235   07/01/21 2145  azithromycin (ZITHROMAX) 500 mg in sodium chloride 0.9 % 250 mL IVPB        500 mg 250 mL/hr over 60 Minutes Intravenous  Once 07/01/21 2141 07/02/21 0000        Subjective: Seen and examined at bedside and states that he is having a good day today and slept well.  Has a meeting with palliative care later today.  No chest pain or shortness of breath.  Feels okay.  No other concerns or complaints at this time.  Objective: Vitals:   08/01/21 0741 08/01/21 0853 08/01/21 1154 08/01/21 1156  BP:  (!) 107/51  127/64  Pulse:  (!) 58    Resp:  16    Temp:      TempSrc:      SpO2: 100%  100%   Weight:      Height:       No intake or output data in the 24 hours ending 08/01/21 1312  Filed Weights   07/27/21 0334 07/28/21 0300 07/29/21 0327  Weight: 73.5 kg 71.2 kg 71.2 kg   Examination: Physical Exam:  Constitutional: Thin elderly Caucasian male in NAD eyes: Lids and conjunctivae normal, sclerae anicteric  ENMT: External Ears, Nose appear normal. Neck: Has a tracheostomy with a PMV in and has blow  by O2 Respiratory: Diminished to auscultation bilaterally with coarse breath sounds, no wheezing, rales, rhonchi or crackles. Normal respiratory effort and patient is not tachypenic. No accessory muscle use. Unlabored breathing  Cardiovascular: RRR, no murmurs / rubs / gallops. No appreciable LE edema  Abdomen: Soft, non-tender, Distended slightly and has a PEG.  Bowel sounds positive.  GU: Deferred. Musculoskeletal: No clubbing / cyanosis of digits/nails. No joint deformity upper and lower extremities.  Skin: No rashes, lesions, ulcers. No induration; Warm and dry.  Neurologic: CN 2-12 grossly intact with no focal deficits. Romberg sign and cerebellar reflexes not assessed.  Psychiatric: Normal judgment and insight. Alert and oriented x 3. Normal mood and appropriate affect.   Data Reviewed: I have personally reviewed following labs and imaging studies  CBC: Recent Labs  Lab 07/28/21 0142 07/31/21 0347 08/01/21 0103  WBC 9.0 7.8 8.9  NEUTROABS  --  5.4 6.7  HGB 10.3* 10.3* 9.9*  HCT 32.6* 31.8* 31.9*  MCV 94.5 93.8 95.2  PLT 200 194 675    Basic Metabolic Panel: Recent Labs  Lab 07/28/21 0142 07/31/21 0347 08/01/21 0103  NA 136 136 136  K 4.5 4.2 4.2  CL 100 102 102  CO2 _0 GLUCOSE 147* 156* 130*  BUN 31* 40* 33*  CREATININE 0.75 0.84 0.73  CALCIUM 9.5 9.0 9.2  MG 2.3 2.2 2.2  PHOS  --  3.8 3.7    GFR: Estimated Creatinine Clearance: 57.6 mL/min (by C-G formula based on SCr of 0.73 mg/dL). Liver Function Tests: Recent Labs  Lab 07/31/21 0347 08/01/21 0103  AST 17 16  ALT 24 20  ALKPHOS 67 67  BILITOT 0.6 0.6  PROT 6.4* 6.5  ALBUMIN 2.5* 2.6*    No results for input(s): LIPASE, AMYLASE in the last  168 hours. No results for input(s): AMMONIA in the last 168 hours. Coagulation Profile: No results for input(s): INR, PROTIME in the last 168 hours. Cardiac Enzymes: No results for input(s): CKTOTAL, CKMB, CKMBINDEX, TROPONINI in the last 168  hours. BNP (last 3 results) Recent Labs    11/08/20 1106  PROBNP 482.0*    HbA1C: No results for input(s): HGBA1C in the last 72 hours. CBG: Recent Labs  Lab 07/31/21 2009 07/31/21 2316 08/01/21 0359 08/01/21 0756 08/01/21 1155  GLUCAP 102* 98 157* 162* 136*    Lipid Profile: No results for input(s): CHOL, HDL, LDLCALC, TRIG, CHOLHDL, LDLDIRECT in the last 72 hours. Thyroid Function Tests: No results for input(s): TSH, T4TOTAL, FREET4, T3FREE, THYROIDAB in the last 72 hours. Anemia Panel: No results for input(s): VITAMINB12, FOLATE, FERRITIN, TIBC, IRON, RETICCTPCT in the last 72 hours. Sepsis Labs: No results for input(s): PROCALCITON, LATICACIDVEN in the last 168 hours.  No results found for this or any previous visit (from the past 240 hour(s)).   RN Pressure Injury Documentation:    Estimated body mass index is 25.34 kg/m as calculated from the following:   Height as of this encounter: _0  (1.676 m).   Weight as of this encounter: 71.2 kg.  Malnutrition Type:  Nutrition Problem: Severe Malnutrition Etiology: chronic illness (COPD)  Malnutrition Characteristics:  Signs/Symptoms: severe fat depletion, severe muscle depletion  Nutrition Interventions:  Interventions: Refer to RD note for recommendations     Radiology Studies: DG CHEST PORT 1 VIEW  Result Date: 08/01/2021 CLINICAL DATA:  Shortness of breath, history of COPD EXAM: PORTABLE CHEST 1 VIEW COMPARISON:  Chest radiograph 07/29/2021 FINDINGS: The tracheostomy tube is stable The cardiomediastinal silhouette is stable, with unchanged calcified atherosclerotic plaque of the thoracic aorta. Linear opacities in the left base likely reflect subsegmental atelectasis. There is no new focal airspace disease. The nodular density in the right upper lobe is unchanged, consistent with a calcified granuloma. There is no significant pleural effusion or pneumothorax. There is no acute osseous abnormality.  IMPRESSION: Stable chest with no new focal airspace disease. Electronically Signed   By: Valetta Mole M.D.   On: 08/01/2021 09:30    Scheduled Meds:  acetaminophen  500 mg Per Tube TID   allopurinol  100 mg Oral Daily   amLODipine  5 mg Oral Daily   arformoterol  15 mcg Nebulization BID   budesonide (PULMICORT) nebulizer solution  0.25 mg Nebulization BID   chlorhexidine gluconate (MEDLINE KIT)  15 mL Mouth Rinse BID   Chlorhexidine Gluconate Cloth  6 each Topical Daily   feeding supplement (PROSource TF)  45 mL Per Tube BID   free water  150 mL Per Tube Q4H   guaiFENesin  10 mL Oral Q8H   insulin aspart  0-9 Units Subcutaneous Q4H   lidocaine  1 application Urethral Once   mouth rinse  15 mL Mouth Rinse 10 times per day   nystatin  5 mL Oral QID   revefenacin  175 mcg Nebulization Daily   scopolamine  1 patch Transdermal Q72H   sennosides  5 mL Oral BID   sodium chloride flush  10-40 mL Intracatheter Q12H   Continuous Infusions:  feeding supplement (OSMOLITE 1.5 CAL) 1,000 mL (07/29/21 2159)    LOS: 31 days   Kerney Elbe, DO [facility] PAGER is on AMION  If 7PM-7AM, please contact night-coverage [email or web address]
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
## 2021-08-01 NOTE — Plan of Care (Signed)
  Problem: Pain Managment: Goal: General experience of comfort will improve Outcome: Progressing   Problem: Education: Goal: Knowledge about tracheostomy care/management will improve Outcome: Progressing   Problem: Activity: Goal: Ability to tolerate increased activity will improve Outcome: Progressing   Problem: Respiratory: Goal: Patent airway maintenance will improve Outcome: Progressing   Problem: Safety: Goal: Non-violent Restraint(s) Outcome: Progressing

## 2021-08-01 NOTE — Progress Notes (Signed)
Palliative Medicine RN Note: Check in with pt; family meeting is set for 1300 with our NP Sharyn Lull.  Pt denies complaint. He asked if I had any answers about the care he can get after d/c. He expressed again that he does not want to go to any facility where he will be "put to the side." I explained that I don't have answers, but there is a family meeting today, and he was already aware of it.   I helped him get the things he likes to have in bed (his clipboard, phone, bed control, etc) and straightened/tied his gown. As I was leaving, RT arrived to check his trach, and family was walking into the room.  Marjie Skiff Siniyah Evangelist, RN, BSN, [facility] Palliative Medicine Team 08/01/2021 12:37 PM Office [phone]

## 2021-08-01 NOTE — Progress Notes (Signed)
Palliative Medicine Inpatient Follow Up Note   Reason for Consultation: goals of care   HPI/Patient Profile: The patient is an 87 year old elderly Caucasian male with a past medical history significant for but not limited to COPD who quit smoking 30 years ago, history of vocal cord cancer, diabetes mellitus type 2 as well as other comorbidities who presented with exertional dyspnea, reports sinusitis and postnasal drip and hoarseness for the last 3 to 4 months.  Is also complaining of difficulty swallowing of food getting stuck in his throat and he also reported phlegm in his throat.  CT maxillofacial soft tissue neck was done and showed asymmetric soft tissue density and fullness at the base of the right aryepiglottic fold extending right piriform sinus with possible involvement of the right glottis inferiorly.  Imaging studies was not able to determine if there is a discrete mass.  ENT was consulted for direct visualization and patient underwent laryngoscopy with biopsy and tracheostomy and he is also Status Santiago PEG tube placement.  Further work-up revealed that he had squamous cell carcinoma of the larynx.  He has been worked up for [facility] but has been refused for inpatient rehabilitation.  SNF is being pursued and currently has a appointment with radiation oncology for a squamous cell carcinoma of the larynx however patient has decided not to pursue radiation treatment.  SLP continues to recommend n.p.o. and PT OT still recommending SNF with supervision assistance and a rolling walker with 5 inch wheels. Patient and Family to meet with Palliative Care at 1300 today to discuss further goals of care. This AM he had two Sinus pauses of 3.5 seconds so Cardiology was consulted for further evaluation and recommendations  Today's Discussion (08/01/2021):  *Please note that this is a verbal dictation therefore any spelling or grammatical errors are due to the "[place]" system  interpretation.  Family meeting at 1300  Participants: Christopher Santiago, Christopher Santiago  Providers: Tacey Ruiz, [facility]  Discussion:  Introduced myself to Christopher Santiago and his family. I introduced Palliative Medicine as specialized medical care for people living with serious illness. It focuses on providing relief from the symptoms and stress of a serious illness. The goal is to improve quality of life for both the patient and the family.  Christopher Santiago and I reviewed his present illness(s) inclusive of COPD, vocal cord cancer, DM2, has had a tracheostomy and Gtube placed. Christopher Santiago shares that the idea of radiation has not sat well with him and that after he and his family spoke to the radiation oncologist he determined that he did not wish to proceed with this. He expresses that he made it to the Christopher Santiago year of his life which was his primary goal. He reviews that now he can die knowing he has accomplished all that he has wanted to in life.  We reviewed that without treatment, Christopher Santiago's cancer will worsen overtime and likely spread to other parts of his body. I shared that when his condition declines further it would then be worthwhile to consider hospice care.  I described hospice as a service for patients for have a life expectancy of < 6 months. It preserves dignity and quality at the end phases of life. The focus changes from curative to symptom relief. Christopher Santiago and his family were receptive to this.  Christopher Santiago shares that he would not want to go to a skilled nursing facility if he were in a comatose state and also that he would never wish to live that  way.He reviews that presently his goals are to improve his strength and condition with the knowledge that over time he will need additional care. We discussed the idea of a short term stay in rehabilitation for muscular deconditioning then transitioning home with hospice care. We reviewed that as he declines he and his family will need to consider stopping g-tube  feedings as this can in many cases prolong the bodies natural process if dying. He and his family understood this.  Plan for placement to optimize strength and then to transition home with hospice care.  Questions and concerns addressed   Objective Assessment: Vital Signs Vitals:   08/01/21 1156 08/01/21 1543  BP: 127/64   Pulse:    Resp:    Temp:    SpO2:  100%   No intake or output data in the 24 hours ending 08/01/21 1555 Last Weight  Most recent update: 07/29/2021  3:29 AM    Weight  71.2 kg (156 lb 15.5 oz)            Gen:  Frail elderly caucasian M in NAD HEENT: moist mucous membranes, tracheostomy CV: Regular rate and rhythm  PULM: On trach collar ABD: G-Tube EXT: No edema  Neuro: Alert and oriented x2-3  SUMMARY OF RECOMMENDATIONS   DNAR  Does not want radiation therapy  Plan for SNF placement - per [facility] has been declined from [facility]  Patient and his family are interested in learning more about hospice services for after SNF stay have requested that TOC have the liaison speak with them  Ongoing PMT support during hospitalization  Time Spent: 84 Greater than 50% of the time was spent in counseling and coordination of care ______________________________________________________________________________________ [place] Team Team Cell Phone: [phone] Please utilize secure chat with additional questions, if there is no response within 30 minutes please call the above phone number  Palliative Medicine Team providers are available by phone from 7am to 7pm daily and can be reached through the team cell phone.  Should this patient require assistance outside of these hours, please call the patient's attending physician.

## 2021-08-02 DIAGNOSIS — F0390 Unspecified dementia without behavioral disturbance: Secondary | ICD-10-CM | POA: Diagnosis not present

## 2021-08-02 DIAGNOSIS — J441 Chronic obstructive pulmonary disease with (acute) exacerbation: Secondary | ICD-10-CM | POA: Diagnosis not present

## 2021-08-02 DIAGNOSIS — I10 Essential (primary) hypertension: Secondary | ICD-10-CM | POA: Diagnosis not present

## 2021-08-02 DIAGNOSIS — Z515 Encounter for palliative care: Secondary | ICD-10-CM | POA: Diagnosis not present

## 2021-08-02 DIAGNOSIS — J9601 Acute respiratory failure with hypoxia: Secondary | ICD-10-CM | POA: Diagnosis not present

## 2021-08-02 LAB — GLUCOSE, CAPILLARY
Glucose-Capillary: 126 mg/dL — ABNORMAL HIGH (ref 70–99)
Glucose-Capillary: 143 mg/dL — ABNORMAL HIGH (ref 70–99)
Glucose-Capillary: 71 mg/dL (ref 70–99)
Glucose-Capillary: 123 mg/dL — ABNORMAL HIGH (ref 70–99)
Glucose-Capillary: 114 mg/dL — ABNORMAL HIGH (ref 70–99)
Glucose-Capillary: 117 mg/dL — ABNORMAL HIGH (ref 70–99)

## 2021-08-02 NOTE — Progress Notes (Signed)
PT. CBG 71, pt keep hold this am, issue has resolved. MD made aware.  CBG rechecked 123

## 2021-08-02 NOTE — Plan of Care (Signed)
  Problem: Education: Goal: Knowledge of General Education information will improve Description: Including pain rating scale, medication(s)/side effects and non-pharmacologic comfort measures Outcome: Progressing   Problem: Health Behavior/Discharge Planning: Goal: Ability to manage health-related needs will improve Outcome: Progressing   Problem: Clinical Measurements: Goal: Ability to maintain clinical measurements within normal limits will improve Outcome: Progressing Goal: Will remain free from infection Outcome: Progressing Goal: Diagnostic test results will improve Outcome: Progressing Goal: Respiratory complications will improve Outcome: Progressing Goal: Cardiovascular complication will be avoided Outcome: Progressing   Problem: Activity: Goal: Risk for activity intolerance will decrease Outcome: Progressing   Problem: Nutrition: Goal: Adequate nutrition will be maintained Outcome: Progressing   Problem: Coping: Goal: Level of anxiety will decrease Outcome: Progressing   Problem: Elimination: Goal: Will not experience complications related to bowel motility Outcome: Progressing Goal: Will not experience complications related to urinary retention Outcome: Progressing   Problem: Pain Managment: Goal: General experience of comfort will improve Outcome: Progressing   Problem: Safety: Goal: Ability to remain free from injury will improve Outcome: Progressing   Problem: Skin Integrity: Goal: Risk for impaired skin integrity will decrease Outcome: Progressing   Problem: Education: Goal: Knowledge about tracheostomy care/management will improve Outcome: Progressing   Problem: Activity: Goal: Ability to tolerate increased activity will improve Outcome: Progressing   Problem: Health Behavior/Discharge Planning: Goal: Ability to manage tracheostomy will improve Outcome: Progressing   Problem: Respiratory: Goal: Patent airway maintenance will  improve Outcome: Progressing   Problem: Role Relationship: Goal: Ability to communicate will improve Outcome: Progressing   Problem: Safety: Goal: Non-violent Restraint(s) Outcome: Progressing

## 2021-08-02 NOTE — Progress Notes (Signed)
PROGRESS NOTE    Christopher Santiago  YQM:[id number] DOB: Apr 20, 1931 DOA: 07/01/2021 PCP: Tonia Ghent, MD   Brief Narrative:  The patient is an 87 year old elderly Caucasian male with a past medical history significant for but not limited to COPD who quit smoking 30 years ago, history of vocal cord cancer, diabetes mellitus type 2 as well as other comorbidities who presented with exertional dyspnea, reports sinusitis and postnasal drip and hoarseness for the last 3 to 4 months.  Is also complaining of difficulty swallowing of food getting stuck in his throat and he also reported phlegm in his throat.  CT maxillofacial soft tissue neck was done and showed asymmetric soft tissue density and fullness at the base of the right aryepiglottic fold extending right piriform sinus with possible involvement of the right glottis inferiorly.  Imaging studies was not able to determine if there is a discrete mass.  ENT was consulted for direct visualization and patient underwent laryngoscopy with biopsy and tracheostomy and he is also Status post PEG tube placement.  Further work-up revealed that he had squamous cell carcinoma of the larynx.  He has been worked up for [facility] but has been refused for inpatient rehabilitation.  SNF is being pursued and currently has a appointment with radiation oncology for a squamous cell carcinoma of the larynx however patient has decided not to pursue radiation treatment.  SLP continues to recommend n.p.o. and PT OT still recommending SNF with supervision assistance and a rolling walker with 5 inch wheels. Patient and Family to meet with Palliative Care at 1300 today to discuss further goals of care. This AM he had two Sinus pauses of 3.5 seconds so Cardiology was consulted for further evaluation and recommendations and because he was asymptomatic they are recommending no indication for pacing at this time  Assessment & Plan:   Principal Problem:   Acute respiratory failure with hypoxia  ([place]) Active Problems:   Essential hypertension   COPD with acute exacerbation (HCC)   Left renal mass   Respiratory failure, acute ([place])   Protein-calorie malnutrition, severe   Tracheostomy status ([place])   Tracheobronchitis   Dementia ([place])  Acute Hypoxic Respiratory Failure, tracheostomy in place -Suspect related to component of aspiration due to dysphagia larynx CA, COPD exacerbation.   -He has completed 5 days of IV steroids  -Completed 7 days of cefepime with last day 8/21 -Respiratory culture with normal respiratory flora -ENT has been managing trach care -Patient is tolerating PM Valve most of the day, where his speech is fluent and very understandable. -SpO2: 100 % O2 Flow Rate (L/min): 5 L/min FiO2 (%): 21 % -Continue with scopolamine patch for secretions. -Continues to have secretions despite scopolamine patch.  Continues to require Suctioning -Repeat a chest x-ray 07/29/21 showed "Question atelectasis or infiltrate at the left base. Otherwise no acute cardiopulmonary  findings. Stable right upper lobe calcified granuloma." -Seems to be stable for the most part.  He was on trach collar but is not hooked up to oxygen and saturating 100%   Sinus Bradycardia/Pauses -Noted to have sinus bradycardia on 9/3.   -Patient was completely asymptomatic.  Noted to have pauses on telemetry but this is a chronic.  Not noted to be on any AV blocking medications. Thyroid function tests are normal.  Electrolytes have been normal.  Bradycardia could have been due to excessive suctioning from a vagal response. Heart rate has been stable for the last 48 hours.  Continue to monitor.   Squamous cell  carcinoma Larynx -CT showed asymmetric soft tissue density fullness at the base of the right aryepiglottic fold. ENT was consulted. Patient underwent laryngoscopy with biopsy and tracheostomy 8/11. Stable on ATC 5 L oxygen.  -Biopsy showed squamous cell carcinoma with foci suspicious for invasion.   -Per ENT note when he was seen and evaluated initially, he was discussed at tumor board, will have outpatient follow-up with medical oncology, radiation oncology.  CT scan of the neck and chest showing that the pulmonary nodules have not progressed since 2019, therefore benign -Trach changed to Shiley 6 Prox XLT Cuffless.  Management per ENT -Patient to be followed at cancer center.  Patient's family is supposed to meet with radiation oncologist today and this was done at 1 PM -Per my discussion with social work cannot safely discharge the patient with his fresh tracheostomy as a will be difficult to find him placement -Palliative care has been consulted for further goals of care discussion after family discussion the family will continue to monitor for his quality of life and hospice has been discussed in the event that the patient does not tolerate cancer treatment or to not to proceed with a cancer therapy after meeting with oncology; patient no longer wants to proceed with radiation therapy -He wants to go home however he does not have a caregiver himself if he goes home with hospice given that his wife is sick and that he is a caregiver for his wife palliative to follow-up for goals of care discussion: Patient has acknowledged that he is too weak to care for his wife at this time and case management still working on SNF placement; he cannot safely be discharged without a safe plan in place -Patient and family met with Palliative and Plan is to go to SNF and then enroll in Hospice after he leaves SNF   Acute Metabolic Encephalopathy -Had significant delirium initially but this appears to have resolved.  -Seems to be back to baseline.   1.2 cm spiculated right upper lobe nodule -Outpatient follow up with pulmonary and medical oncology if the patient desires    Left Renal Mass  -Concerning for renal carcinoma, will need to follow-up with urology as an outpatient.   -These findings were  communicated to patient's daughter by previous providers.   Diabetes mellitus type II, controlled -HbA1c 5.8.   -Continue to Monitor CBGs.   -SSI. -CBGs ranging from 71-143   Essential Hypertension  -Blood pressure stable.  Noted to be on amlodipine. -Continue to monitor blood pressures per protocol -Last blood pressure reading was 139/52  Sinus Pauses -Had two 3.5 second pauses -Cardiology consulted for further evaluation -They are recommending No intervention given that he is Asymptomatic   Normocytic Anemia -No evidence of overt bleeding.  Hemoglobin remained stable. -Last hemoglobin/hematocrit was stable at  10.3/31.8 and dropped to 9.9/31.9 on last check  -Continue monitor for signs and symptoms of bleeding; currently no overt bleeding noted   Leukocytosis  -Probably related to steroids vs infection.  -Resolved and it is 8.9 on last check -Continue to Monitor and Trend intermittently   Severe protein calorie malnutrition -Nutritionist consulted for further evaluation recommendations -Nutritional services recommending continuing tube feedings via PEG with Osmolite 1.5 at 55 MLS per hour and recommending Prosource tube feedings at 45 mL twice daily and 150 mL free water flushes every 4 hours -Had issues with his TF Pump this AM but is now fixed  DVT prophylaxis: SCDs Code Status: DO NOT RESUSCITATE Family Communication: No  family present at bedside Disposition Plan: Original plan was for the patient to go to SNF for now PT OT recommended recommendation for home health; may be able to send him home pending on his clinical improvement but he is too weak to go home so we will still go to SNF as he does not have a caregiver at home  Status is: Inpatient  Remains inpatient appropriate because:Unsafe d/c plan, IV treatments appropriate due to intensity of illness or inability to take PO, and Inpatient level of care appropriate due to severity of illness  Dispo: The patient is  from: Home              Anticipated d/c is to: SNF              Patient currently is not medically stable to d/c.   Difficult to place patient No  Consultants:  ENT Trauma surgery for PEG Palliaitve  PCCM  Procedures:  Laryngoscopy / trach  ECHOCARDIOGRAM IMPRESSIONS   1. Technically difficult study, very poor visualization of left ventricle even after contrast administration. Left ventricular ejection fraction, by estimation, is 55 to 60%. The left ventricle has grossly normal function but very poorly visualized. Left ventricular endocardial border not optimally defined to evaluate regional wall motion. Left ventricular diastolic parameters are indeterminate.   2. Right ventricular systolic function is normal. The right ventricular size is mildly enlarged. Tricuspid regurgitation signal is inadequate for assessing PA pressure.   3. Right atrial size was moderately dilated.   4. A small pericardial effusion is present. The pericardial effusion is anterior to the right ventricle. Presence of pericardial fat pad.   5. The mitral valve is grossly normal. No evidence of mitral valve regurgitation.   6. The aortic valve was not well visualized. Aortic valve regurgitation is not visualized. No aortic stenosis is present.   7. The inferior vena cava is normal in size with greater than 50% respiratory variability, suggesting right atrial pressure of 3 mmHg.   Antimicrobials:  Anti-infectives (From admission, onward)    Start     Dose/Rate Route Frequency Ordered Stop   07/12/21 2200  ceFEPIme (MAXIPIME) 2 g in sodium chloride 0.9 % 100 mL IVPB        2 g 200 mL/hr over 30 Minutes Intravenous Every 12 hours 07/12/21 1555 07/14/21 2359   07/10/21 2200  ceFEPIme (MAXIPIME) 2 g in sodium chloride 0.9 % 100 mL IVPB  Status:  Discontinued        2 g 200 mL/hr over 30 Minutes Intravenous Every 8 hours 07/10/21 1316 07/12/21 1555   07/08/21 0800  vancomycin (VANCOCIN) IVPB 1000 mg/200 mL premix   Status:  Discontinued        1,000 mg 200 mL/hr over 60 Minutes Intravenous Every 24 hours 07/07/21 0747 07/09/21 0855   07/07/21 0900  vancomycin (VANCOREADY) IVPB 1250 mg/250 mL        1,250 mg 166.7 mL/hr over 90 Minutes Intravenous  Once 07/07/21 0747 07/07/21 1412   07/07/21 0900  ceFEPIme (MAXIPIME) 2 g in sodium chloride 0.9 % 100 mL IVPB  Status:  Discontinued        2 g 200 mL/hr over 30 Minutes Intravenous Every 12 hours 07/07/21 0747 07/10/21 1316   07/07/21 0800  cefTRIAXone (ROCEPHIN) 1 g in sodium chloride 0.9 % 100 mL IVPB  Status:  Discontinued        1 g 200 mL/hr over 30 Minutes Intravenous Every 24 hours 07/07/21 0707  07/07/21 0709   07/06/21 1430  fluconazole (DIFLUCAN) IVPB 100 mg  Status:  Discontinued        100 mg 50 mL/hr over 60 Minutes Intravenous Every 24 hours 07/06/21 1341 07/06/21 1404   07/02/21 2100  cefTRIAXone (ROCEPHIN) 1 g in sodium chloride 0.9 % 100 mL IVPB        1 g 200 mL/hr over 30 Minutes Intravenous Every 24 hours 07/02/21 0114 07/06/21 2325   07/01/21 2145  cefTRIAXone (ROCEPHIN) 1 g in sodium chloride 0.9 % 100 mL IVPB        1 g 200 mL/hr over 30 Minutes Intravenous  Once 07/01/21 2141 07/01/21 2235   07/01/21 2145  azithromycin (ZITHROMAX) 500 mg in sodium chloride 0.9 % 250 mL IVPB        500 mg 250 mL/hr over 60 Minutes Intravenous  Once 07/01/21 2141 07/02/21 0000        Subjective: Seen and examined at bedside and he feels about the same as yesterday.  Denies any chest pain or shortness breath.  Feels "bored".  No nausea or vomiting.  No other concerns or plans at this time.  Objective: Vitals:   08/02/21 0722 08/02/21 0723 08/02/21 0838 08/02/21 1102  BP:   (!) 139/52   Pulse:   66   Resp:   14   Temp:   97.7 F (36.5 C)   TempSrc:   Axillary   SpO2: 100% 100% 99% 100%  Weight:   72.2 kg   Height:        Intake/Output Summary (Last 24 hours) at 08/02/2021 1234 Last data filed at 08/02/2021 0306 Gross per 24 hour   Intake --  Output 1750 ml  Net -1750 ml    Filed Weights   07/28/21 0300 07/29/21 0327 08/02/21 0838  Weight: 71.2 kg 71.2 kg 72.2 kg   Examination: Physical Exam:  Constitutional: The patient is a thin elderly chronically ill-appearing Caucasian male currently in no acute distress  Eyes: Lids and conjunctivae normal, sclerae anicteric  ENMT: External Ears, Nose appear normal.   Neck: Has a tracheostomy in the PMV in place and his oxygen is not connected Respiratory: Diminished to auscultation bilaterally, no wheezing, rales, rhonchi or crackles. Normal respiratory effort and patient is not tachypenic. No accessory muscle use. Unlabored Breathing Cardiovascular: RRR, no murmurs / rubs / gallops. S1 and S2 auscultated. No appreciable LE edema Abdomen: Soft, non-tender, non-distended. Bowel sounds positive. PEG in place  GU: Deferred. Musculoskeletal: No clubbing / cyanosis of digits/nails. No joint deformity upper and lower extremities.  Skin: No rashes, lesions, ulcers on a limited skin evaluation. No induration; Warm and dry.  Neurologic: CN 2-12 grossly intact with no focal deficits. Romberg sign and cerebellar reflexes not assessed.  Psychiatric: Normal judgment and insight. Alert and oriented x 3. Normal mood and appropriate affect.   Data Reviewed: I have personally reviewed following labs and imaging studies  CBC: Recent Labs  Lab 07/28/21 0142 07/31/21 0347 08/01/21 0103  WBC 9.0 7.8 8.9  NEUTROABS  --  5.4 6.7  HGB 10.3* 10.3* 9.9*  HCT 32.6* 31.8* 31.9*  MCV 94.5 93.8 95.2  PLT 200 194 277    Basic Metabolic Panel: Recent Labs  Lab 07/28/21 0142 07/31/21 0347 08/01/21 0103  NA 136 136 136  K 4.5 4.2 4.2  CL 100 102 102  CO2 28 27 28   GLUCOSE 147* 156* 130*  BUN 31* 40* 33*  CREATININE 0.75 0.84 0.73  CALCIUM  9.5 9.0 9.2  MG 2.3 2.2 2.2  PHOS  --  3.8 3.7    GFR: Estimated Creatinine Clearance: 57.6 mL/min (by C-G formula based on SCr of 0.73  mg/dL). Liver Function Tests: Recent Labs  Lab 07/31/21 0347 08/01/21 0103  AST 17 16  ALT 24 20  ALKPHOS 67 67  BILITOT 0.6 0.6  PROT 6.4* 6.5  ALBUMIN 2.5* 2.6*    No results for input(s): LIPASE, AMYLASE in the last 168 hours. No results for input(s): AMMONIA in the last 168 hours. Coagulation Profile: No results for input(s): INR, PROTIME in the last 168 hours. Cardiac Enzymes: No results for input(s): CKTOTAL, CKMB, CKMBINDEX, TROPONINI in the last 168 hours. BNP (last 3 results) Recent Labs    11/08/20 1106  PROBNP 482.0*    HbA1C: No results for input(s): HGBA1C in the last 72 hours. CBG: Recent Labs  Lab 08/01/21 1912 08/01/21 2253 08/02/21 0256 08/02/21 0747 08/02/21 1153  GLUCAP 139* 95 126* 143* 71    Lipid Profile: No results for input(s): CHOL, HDL, LDLCALC, TRIG, CHOLHDL, LDLDIRECT in the last 72 hours. Thyroid Function Tests: No results for input(s): TSH, T4TOTAL, FREET4, T3FREE, THYROIDAB in the last 72 hours. Anemia Panel: No results for input(s): VITAMINB12, FOLATE, FERRITIN, TIBC, IRON, RETICCTPCT in the last 72 hours. Sepsis Labs: No results for input(s): PROCALCITON, LATICACIDVEN in the last 168 hours.  No results found for this or any previous visit (from the past 240 hour(s)).   RN Pressure Injury Documentation:    Estimated body mass index is 25.69 kg/m as calculated from the following:   Height as of this encounter: 5' 6"  (1.676 m).   Weight as of this encounter: 72.2 kg.  Malnutrition Type:  Nutrition Problem: Severe Malnutrition Etiology: chronic illness (COPD)  Malnutrition Characteristics:  Signs/Symptoms: severe fat depletion, severe muscle depletion  Nutrition Interventions:  Interventions: Refer to RD note for recommendations     Radiology Studies: DG CHEST PORT 1 VIEW  Result Date: 08/01/2021 CLINICAL DATA:  Shortness of breath, history of COPD EXAM: PORTABLE CHEST 1 VIEW COMPARISON:  Chest radiograph  07/29/2021 FINDINGS: The tracheostomy tube is stable The cardiomediastinal silhouette is stable, with unchanged calcified atherosclerotic plaque of the thoracic aorta. Linear opacities in the left base likely reflect subsegmental atelectasis. There is no new focal airspace disease. The nodular density in the right upper lobe is unchanged, consistent with a calcified granuloma. There is no significant pleural effusion or pneumothorax. There is no acute osseous abnormality. IMPRESSION: Stable chest with no new focal airspace disease. Electronically Signed   By: Valetta Mole M.D.   On: 08/01/2021 09:30    Scheduled Meds:  acetaminophen  500 mg Per Tube TID   allopurinol  100 mg Oral Daily   amLODipine  5 mg Oral Daily   arformoterol  15 mcg Nebulization BID   budesonide (PULMICORT) nebulizer solution  0.25 mg Nebulization BID   chlorhexidine gluconate (MEDLINE KIT)  15 mL Mouth Rinse BID   Chlorhexidine Gluconate Cloth  6 each Topical Daily   feeding supplement (PROSource TF)  45 mL Per Tube BID   free water  150 mL Per Tube Q4H   guaiFENesin  10 mL Oral Q8H   insulin aspart  0-9 Units Subcutaneous Q4H   lidocaine  1 application Urethral Once   mouth rinse  15 mL Mouth Rinse 10 times per day   nystatin  5 mL Oral QID   revefenacin  175 mcg Nebulization Daily  scopolamine  1 patch Transdermal Q72H   sennosides  5 mL Oral BID   sodium chloride flush  10-40 mL Intracatheter Q12H   Continuous Infusions:  feeding supplement (OSMOLITE 1.5 CAL) 1,000 mL (08/01/21 2107)    LOS: 32 days   Kerney Elbe, DO [facility] PAGER is on AMION  If 7PM-7AM, please contact night-coverage [email or web address]

## 2021-08-02 NOTE — Progress Notes (Signed)
Palliative Medicine Inpatient Follow Up Note   Reason for Consultation: goals of care   HPI/Patient Profile: The patient is an 87 year old elderly Caucasian male with a past medical history significant for but not limited to COPD who quit smoking 30 years ago, history of vocal cord cancer, diabetes mellitus type 2 as well as other comorbidities who presented with exertional dyspnea, reports sinusitis and postnasal drip and hoarseness for the last 3 to 4 months.  Is also complaining of difficulty swallowing of food getting stuck in his throat and he also reported phlegm in his throat.  CT maxillofacial soft tissue neck was done and showed asymmetric soft tissue density and fullness at the base of the right aryepiglottic fold extending right piriform sinus with possible involvement of the right glottis inferiorly.  Imaging studies was not able to determine if there is a discrete mass.  ENT was consulted for direct visualization and patient underwent laryngoscopy with biopsy and tracheostomy and he is also Status post PEG tube placement.  Further work-up revealed that he had squamous cell carcinoma of the larynx.  He has been worked up for [facility] but has been refused for inpatient rehabilitation.  SNF is being pursued and currently has a appointment with radiation oncology for a squamous cell carcinoma of the larynx however patient has decided not to pursue radiation treatment.  SLP continues to recommend n.p.o. and PT OT still recommending SNF with supervision assistance and a rolling walker with 5 inch wheels. Patient and Family to meet with Palliative Care at 1300 today to discuss further goals of care. This AM he had two Sinus pauses of 3.5 seconds so Cardiology was consulted for further evaluation and recommendations  Today's Discussion (08/02/2021):  *Please note that this is a verbal dictation therefore any spelling or grammatical errors are due to the "[employer]" system  interpretation.  Chart reviewed.  I met at bedside with [facility] this morning. He shares with me that he feel "frustrated" by his prolonged hospital stay. He expresses the desire to "get up and move". We reviewed the plan as per conversation yesterday to find him a rehabilitation for him to get stronger. He asked about [facility] and I shared that he had not been accepted there though the [facility] team will continue to seek alternatives near his family. He is thankful for this update.   We reviewed the importance of continuing to get OOB and walk with the nursing staff. Yuepheng otherwise denies pain or shortness of breath this morning.   There is no family present at bedside during our time of meeting.   Questions and concerns addressed   Objective Assessment: Vital Signs Vitals:   08/02/21 0722 08/02/21 0723  BP:    Pulse:    Resp:    Temp:    SpO2: 100% 100%    Intake/Output Summary (Last 24 hours) at 08/02/2021 0753 Last data filed at 08/02/2021 9678 Gross per 24 hour  Intake --  Output 1750 ml  Net -1750 ml   Last Weight  Most recent update: 07/29/2021  3:29 AM    Weight  71.2 kg (156 lb 15.5 oz)            Gen:  Frail elderly caucasian M in NAD HEENT: moist mucous membranes, tracheostomy CV: Regular rate and rhythm  PULM: On trach collar ABD: G-Tube EXT: No edema  Neuro: Alert and oriented x2-3  SUMMARY OF RECOMMENDATIONS   DNAR   Does not want radiation therapy  Plan for SNF placement - per [facility] has been declined from [facility] so they are expanding their search   Patient and his family are interested in learning more about hospice services for after SNF stay have requested that TOC have the liaison speak with them   Ongoing PMT support during hospitalization  Time Spent: 25 Greater than 50% of the time was spent in counseling and coordination of care ______________________________________________________________________________________ [place] Team Team Cell Phone: [phone] Please utilize secure chat with additional questions, if there is no response within 30 minutes please call the above phone number  Palliative Medicine Team providers are available by phone from 7am to 7pm daily and can be reached through the team cell phone.  Should this patient require assistance outside of these hours, please call the patient's attending physician.

## 2021-08-03 DIAGNOSIS — J9601 Acute respiratory failure with hypoxia: Secondary | ICD-10-CM | POA: Diagnosis not present

## 2021-08-03 DIAGNOSIS — J441 Chronic obstructive pulmonary disease with (acute) exacerbation: Secondary | ICD-10-CM | POA: Diagnosis not present

## 2021-08-03 DIAGNOSIS — F0390 Unspecified dementia without behavioral disturbance: Secondary | ICD-10-CM | POA: Diagnosis not present

## 2021-08-03 DIAGNOSIS — I10 Essential (primary) hypertension: Secondary | ICD-10-CM | POA: Diagnosis not present

## 2021-08-03 LAB — COMPREHENSIVE METABOLIC PANEL WITH GFR
ALT: 15 U/L (ref 0–44)
AST: 15 U/L (ref 15–41)
Albumin: 2.6 g/dL — ABNORMAL LOW (ref 3.5–5.0)
Alkaline Phosphatase: 79 U/L (ref 38–126)
Anion gap: 8 (ref 5–15)
BUN: 35 mg/dL — ABNORMAL HIGH (ref 8–23)
CO2: 29 mmol/L (ref 22–32)
Calcium: 9.1 mg/dL (ref 8.9–10.3)
Chloride: 102 mmol/L (ref 98–111)
Creatinine, Ser: 0.81 mg/dL (ref 0.61–1.24)
GFR, Estimated: >60 mL/min
Glucose, Bld: 173 mg/dL — ABNORMAL HIGH (ref 70–99)
Potassium: 4.3 mmol/L (ref 3.5–5.1)
Sodium: 139 mmol/L (ref 135–145)
Total Bilirubin: 0.4 mg/dL (ref 0.3–1.2)
Total Protein: 6.5 g/dL (ref 6.5–8.1)

## 2021-08-03 LAB — CBC WITH DIFFERENTIAL/PLATELET
Abs Immature Granulocytes: 0.05 K/uL (ref 0.00–0.07)
Basophils Absolute: 0 K/uL (ref 0.0–0.1)
Basophils Relative: 0 %
Eosinophils Absolute: 0.2 K/uL (ref 0.0–0.5)
Eosinophils Relative: 2 %
HCT: 32.4 % — ABNORMAL LOW (ref 39.0–52.0)
Hemoglobin: 10.1 g/dL — ABNORMAL LOW (ref 13.0–17.0)
Immature Granulocytes: 0 %
Lymphocytes Relative: 7 %
Lymphs Abs: 0.9 K/uL (ref 0.7–4.0)
MCH: 29.9 pg (ref 26.0–34.0)
MCHC: 31.2 g/dL (ref 30.0–36.0)
MCV: 95.9 fL (ref 80.0–100.0)
Monocytes Absolute: 0.9 K/uL (ref 0.1–1.0)
Monocytes Relative: 7 %
Neutro Abs: 9.9 K/uL — ABNORMAL HIGH (ref 1.7–7.7)
Neutrophils Relative %: 84 %
Platelets: 181 K/uL (ref 150–400)
RBC: 3.38 MIL/uL — ABNORMAL LOW (ref 4.22–5.81)
RDW: 13.6 % (ref 11.5–15.5)
WBC: 11.9 K/uL — ABNORMAL HIGH (ref 4.0–10.5)
nRBC: 0 % (ref 0.0–0.2)

## 2021-08-03 LAB — MAGNESIUM: Magnesium: 2.2 mg/dL (ref 1.7–2.4)

## 2021-08-03 LAB — GLUCOSE, CAPILLARY
Glucose-Capillary: 100 mg/dL — ABNORMAL HIGH (ref 70–99)
Glucose-Capillary: 129 mg/dL — ABNORMAL HIGH (ref 70–99)
Glucose-Capillary: 137 mg/dL — ABNORMAL HIGH (ref 70–99)
Glucose-Capillary: 141 mg/dL — ABNORMAL HIGH (ref 70–99)
Glucose-Capillary: 182 mg/dL — ABNORMAL HIGH (ref 70–99)

## 2021-08-03 LAB — PHOSPHORUS: Phosphorus: 3.2 mg/dL (ref 2.5–4.6)

## 2021-08-03 NOTE — Progress Notes (Signed)
PROGRESS NOTE    Christopher Santiago  XBD:[id number] DOB: November 13, 1931 DOA: 07/01/2021 PCP: Tonia Ghent, MD   Brief Narrative:  The patient is an 87 year old elderly Caucasian male with a past medical history significant for but not limited to COPD who quit smoking 30 years ago, history of vocal cord cancer, diabetes mellitus type 2 as well as other comorbidities who presented with exertional dyspnea, reports sinusitis and postnasal drip and hoarseness for the last 3 to 4 months.  Is also complaining of difficulty swallowing of food getting stuck in his throat and he also reported phlegm in his throat.  CT maxillofacial soft tissue neck was done and showed asymmetric soft tissue density and fullness at the base of the right aryepiglottic fold extending right piriform sinus with possible involvement of the right glottis inferiorly.  Imaging studies was not able to determine if there is a discrete mass.  ENT was consulted for direct visualization and patient underwent laryngoscopy with biopsy and tracheostomy and he is also Status post PEG tube placement.  Further work-up revealed that he had squamous cell carcinoma of the larynx.  He has been worked up for [facility] but has been refused for inpatient rehabilitation.  SNF is being pursued and currently has a appointment with radiation oncology for a squamous cell carcinoma of the larynx however patient has decided not to pursue radiation treatment.  SLP continues to recommend n.p.o. and PT OT still recommending SNF with supervision assistance and a rolling walker with 5 inch wheels. Patient and Family to meet with Palliative Care at 1300 today to discuss further goals of care. This AM he had two Sinus pauses of 3.5 seconds so Cardiology was consulted for further evaluation and recommendations and because he was asymptomatic they are recommending no indication for pacing at this time  Assessment & Plan:   Principal Problem:   Acute respiratory failure with hypoxia  ([place]) Active Problems:   Essential hypertension   COPD with acute exacerbation (HCC)   Left renal mass   Respiratory failure, acute ([place])   Protein-calorie malnutrition, severe   Tracheostomy status ([place])   Tracheobronchitis   Dementia ([place])  Acute Hypoxic Respiratory Failure, tracheostomy in place -Suspect related to component of aspiration due to dysphagia larynx CA, COPD exacerbation.   -He has completed 5 days of IV steroids  -Completed 7 days of cefepime with last day 8/21 -Respiratory culture with normal respiratory flora -ENT has been managing trach care -Patient is tolerating PM Valve most of the day, where his speech is fluent and very understandable. -SpO2: 98 % O2 Flow Rate (L/min): 5 L/min FiO2 (%): 21 % -Continue with scopolamine patch for secretions. -Continues to have secretions despite scopolamine patch.  Continues to require Suctioning -Repeat a chest x-ray 07/29/21 showed "Question atelectasis or infiltrate at the left base. Otherwise no acute cardiopulmonary  findings. Stable right upper lobe calcified granuloma." -Seems to be stable for the most part.  He was on trach collar but is not hooked up to oxygen and saturating 100%   Sinus Bradycardia/Pauses -Noted to have sinus bradycardia on 9/3.   -Patient was completely asymptomatic.  Noted to have pauses on telemetry but this is a chronic.  Not noted to be on any AV blocking medications. Thyroid function tests are normal.  Electrolytes have been normal.  Bradycardia could have been due to excessive suctioning from a vagal response. Heart rate has been stable for the last 48 hours.  Continue to monitor.   Squamous cell  carcinoma Larynx -CT showed asymmetric soft tissue density fullness at the base of the right aryepiglottic fold. ENT was consulted. Patient underwent laryngoscopy with biopsy and tracheostomy 8/11. Stable on ATC 5 L oxygen.  -Biopsy showed squamous cell carcinoma with foci suspicious for invasion.   -Per ENT note when he was seen and evaluated initially, he was discussed at tumor board, will have outpatient follow-up with medical oncology, radiation oncology.  CT scan of the neck and chest showing that the pulmonary nodules have not progressed since 2019, therefore benign -Trach changed to Shiley 6 Prox XLT Cuffless.  Management per ENT -Patient to be followed at cancer center.  Patient's family is supposed to meet with radiation oncologist today and this was done at 1 PM -Per my discussion with social work cannot safely discharge the patient with his fresh tracheostomy as a will be difficult to find him placement -Palliative care has been consulted for further goals of care discussion after family discussion the family will continue to monitor for his quality of life and hospice has been discussed in the event that the patient does not tolerate cancer treatment or to not to proceed with a cancer therapy after meeting with oncology; patient no longer wants to proceed with radiation therapy -He wants to go home however he does not have a caregiver himself if he goes home with hospice given that his wife is sick and that he is a caregiver for his wife palliative to follow-up for goals of care discussion: Patient has acknowledged that he is too weak to care for his wife at this time and case management still working on SNF placement; he cannot safely be discharged without a safe plan in place -WBC has gone up from 9.0 -> 7.8 -> 8.9 -> 11.9 -Patient and family met with Palliative and Plan is to go to SNF and then enroll in Hospice after he leaves SNF   Acute Metabolic Encephalopathy -Had significant delirium initially but this appears to have resolved.  -Seems to be back to baseline.   1.2 cm spiculated right upper lobe nodule -Outpatient follow up with pulmonary and medical oncology if the patient desires    Left Renal Mass  -Concerning for renal carcinoma, will need to follow-up with urology as  an outpatient.   -These findings were communicated to patient's daughter by previous providers.   Diabetes mellitus type II, controlled -HbA1c 5.8.   -Continue to Monitor CBGs.   -SSI. -CBGs ranging from 114-137   Essential Hypertension  -Blood pressure stable.  Noted to be on amlodipine. -Continue to monitor blood pressures per protocol -Last blood pressure reading was 123/57  Sinus Pauses -Had two 3.5 second pauses -Cardiology consulted for further evaluation -They are recommending No intervention given that he is Asymptomatic   Normocytic Anemia -No evidence of overt bleeding.  Hemoglobin remained stable. -Last hemoglobin/hematocrit is stable at 10.1/32.4  -Continue monitor for signs and symptoms of bleeding; currently no overt bleeding noted   Leukocytosis, worsened slightly  -Probably related to steroids vs infection and had improved.  -Resolved initially but trended up to 11.9 now from 8.9 -If continues to worsen will Pan-Culture and obtain Cx from Tracheal Aspirate  -Continue to Monitor and Trend intermittently   Severe protein calorie malnutrition -Nutritionist consulted for further evaluation recommendations -Nutritional services recommending continuing tube feedings via PEG with Osmolite 1.5 at 55 MLS per hour and recommending Prosource tube feedings at 45 mL twice daily and 150 mL free water flushes every 4 hours -  Had issues with his TF Pump this AM but is now fixed  DVT prophylaxis: SCDs Code Status: DO NOT RESUSCITATE Family Communication: No family present at bedside Disposition Plan: Original plan was for the patient to go to SNF for now PT OT recommended recommendation for home health; may be able to send him home pending on his clinical improvement but he is too weak to go home so we will still go to SNF as he does not have a caregiver at home  Status is: Inpatient  Remains inpatient appropriate because:Unsafe d/c plan, IV treatments appropriate due to  intensity of illness or inability to take PO, and Inpatient level of care appropriate due to severity of illness  Dispo: The patient is from: Home              Anticipated d/c is to: SNF              Patient currently is not medically stable to d/c.   Difficult to place patient No  Consultants:  ENT Trauma surgery for PEG Palliaitve  PCCM  Procedures:  Laryngoscopy / trach  ECHOCARDIOGRAM IMPRESSIONS   1. Technically difficult study, very poor visualization of left ventricle even after contrast administration. Left ventricular ejection fraction, by estimation, is 55 to 60%. The left ventricle has grossly normal function but very poorly visualized. Left ventricular endocardial border not optimally defined to evaluate regional wall motion. Left ventricular diastolic parameters are indeterminate.   2. Right ventricular systolic function is normal. The right ventricular size is mildly enlarged. Tricuspid regurgitation signal is inadequate for assessing PA pressure.   3. Right atrial size was moderately dilated.   4. A small pericardial effusion is present. The pericardial effusion is anterior to the right ventricle. Presence of pericardial fat pad.   5. The mitral valve is grossly normal. No evidence of mitral valve regurgitation.   6. The aortic valve was not well visualized. Aortic valve regurgitation is not visualized. No aortic stenosis is present.   7. The inferior vena cava is normal in size with greater than 50% respiratory variability, suggesting right atrial pressure of 3 mmHg.   Antimicrobials:  Anti-infectives (From admission, onward)    Start     Dose/Rate Route Frequency Ordered Stop   07/12/21 2200  ceFEPIme (MAXIPIME) 2 g in sodium chloride 0.9 % 100 mL IVPB        2 g 200 mL/hr over 30 Minutes Intravenous Every 12 hours 07/12/21 1555 07/14/21 2359   07/10/21 2200  ceFEPIme (MAXIPIME) 2 g in sodium chloride 0.9 % 100 mL IVPB  Status:  Discontinued        2 g 200 mL/hr  over 30 Minutes Intravenous Every 8 hours 07/10/21 1316 07/12/21 1555   07/08/21 0800  vancomycin (VANCOCIN) IVPB 1000 mg/200 mL premix  Status:  Discontinued        1,000 mg 200 mL/hr over 60 Minutes Intravenous Every 24 hours 07/07/21 0747 07/09/21 0855   07/07/21 0900  vancomycin (VANCOREADY) IVPB 1250 mg/250 mL        1,250 mg 166.7 mL/hr over 90 Minutes Intravenous  Once 07/07/21 0747 07/07/21 1412   07/07/21 0900  ceFEPIme (MAXIPIME) 2 g in sodium chloride 0.9 % 100 mL IVPB  Status:  Discontinued        2 g 200 mL/hr over 30 Minutes Intravenous Every 12 hours 07/07/21 0747 07/10/21 1316   07/07/21 0800  cefTRIAXone (ROCEPHIN) 1 g in sodium chloride 0.9 % 100 mL IVPB  Status:  Discontinued        1 g 200 mL/hr over 30 Minutes Intravenous Every 24 hours 07/07/21 0707 07/07/21 0709   07/06/21 1430  fluconazole (DIFLUCAN) IVPB 100 mg  Status:  Discontinued        100 mg 50 mL/hr over 60 Minutes Intravenous Every 24 hours 07/06/21 1341 07/06/21 1404   07/02/21 2100  cefTRIAXone (ROCEPHIN) 1 g in sodium chloride 0.9 % 100 mL IVPB        1 g 200 mL/hr over 30 Minutes Intravenous Every 24 hours 07/02/21 0114 07/06/21 2325   07/01/21 2145  cefTRIAXone (ROCEPHIN) 1 g in sodium chloride 0.9 % 100 mL IVPB        1 g 200 mL/hr over 30 Minutes Intravenous  Once 07/01/21 2141 07/01/21 2235   07/01/21 2145  azithromycin (ZITHROMAX) 500 mg in sodium chloride 0.9 % 250 mL IVPB        500 mg 250 mL/hr over 60 Minutes Intravenous  Once 07/01/21 2141 07/02/21 0000        Subjective: Seen and examined at bedside and he denies any pain.  He had pulled out his Passy-Muir valve.  Denies any chest pain or shortness of breath.  No other concerns or complaints this time.  Objective: Vitals:   08/03/21 0721 08/03/21 0752 08/03/21 1143 08/03/21 1252  BP:  127/70  (!) 123/57  Pulse:  88  82  Resp:  (!) 22  (!) 26  Temp:  98.7 F (37.1 C)  99 F (37.2 C)  TempSrc:  Oral  Oral  SpO2: 100% 100% 100%  98%  Weight:      Height:        Intake/Output Summary (Last 24 hours) at 08/03/2021 1454 Last data filed at 08/03/2021 1761 Gross per 24 hour  Intake --  Output 1700 ml  Net -1700 ml    Filed Weights   07/28/21 0300 07/29/21 0327 08/02/21 0838  Weight: 71.2 kg 71.2 kg 72.2 kg   Examination: Physical Exam:  Constitutional: Patient is a thin elderly chronically ill-appearing Caucasian male currently in no acute distress Eyes: Lids and conjunctivae normal, sclerae anicteric  ENMT: External Ears, Nose appear normal.  Neck: Has a tracheostomy and his PMV has been pulled out and is not connected oxygen currently Respiratory: Managed to auscultation bilaterally with coarse breath sounds, no wheezing, rales, rhonchi or crackles. Normal respiratory effort and patient is not tachypenic. No accessory muscle use.  Unlabored breathing Cardiovascular: RRR, no murmurs / rubs / gallops. S1 and S2 auscultated.  No appreciable lower extremity edema Abdomen: Soft, non-tender, non-distended. Bowel sounds positive.  GU: Deferred. Musculoskeletal: No clubbing / cyanosis of digits/nails. No joint deformity upper and lower extremities. Skin: No rashes, lesions, ulcers on limited skin evaluation. No induration; Warm and dry.  Neurologic: CN 2-12 grossly intact with no focal deficits. Romberg sign and cerebellar reflexes not assessed.  Psychiatric: Normal judgment and insight. Alert and oriented x 3. Normal mood and appropriate affect.   Data Reviewed: I have personally reviewed following labs and imaging studies  CBC: Recent Labs  Lab 07/28/21 0142 07/31/21 0347 08/01/21 0103 08/03/21 0350  WBC 9.0 7.8 8.9 11.9*  NEUTROABS  --  5.4 6.7 9.9*  HGB 10.3* 10.3* 9.9* 10.1*  HCT 32.6* 31.8* 31.9* 32.4*  MCV 94.5 93.8 95.2 95.9  PLT 200 194 187 607    Basic Metabolic Panel: Recent Labs  Lab 07/28/21 0142 07/31/21 0347 08/01/21 0103 08/03/21 0350  NA  136 136 136 139  K 4.5 4.2 4.2 4.3  CL  100 102 102 102  CO2 _0 GLUCOSE 147* 156* 130* 173*  BUN 31* 40* 33* 35*  CREATININE 0.75 0.84 0.73 0.81  CALCIUM 9.5 9.0 9.2 9.1  MG 2.3 2.2 2.2 2.2  PHOS  --  3.8 3.7 3.2    GFR: Estimated Creatinine Clearance: 56.9 mL/min (by C-G formula based on SCr of 0.81 mg/dL). Liver Function Tests: Recent Labs  Lab 07/31/21 0347 08/01/21 0103 08/03/21 0350  AST _1 ALT _2 ALKPHOS 67 67 79  BILITOT 0.6 0.6 0.4  PROT 6.4* 6.5 6.5  ALBUMIN 2.5* 2.6* 2.6*    No results for input(s): LIPASE, AMYLASE in the last 168 hours. No results for input(s): AMMONIA in the last 168 hours. Coagulation Profile: No results for input(s): INR, PROTIME in the last 168 hours. Cardiac Enzymes: No results for input(s): CKTOTAL, CKMB, CKMBINDEX, TROPONINI in the last 168 hours. BNP (last 3 results) Recent Labs    11/08/20 1106  PROBNP 482.0*    HbA1C: No results for input(s): HGBA1C in the last 72 hours. CBG: Recent Labs  Lab 08/02/21 1613 08/02/21 2013 08/03/21 0013 08/03/21 0751 08/03/21 1253  GLUCAP 114* 117* 129* 137* 141*    Lipid Profile: No results for input(s): CHOL, HDL, LDLCALC, TRIG, CHOLHDL, LDLDIRECT in the last 72 hours. Thyroid Function Tests: No results for input(s): TSH, T4TOTAL, FREET4, T3FREE, THYROIDAB in the last 72 hours. Anemia Panel: No results for input(s): VITAMINB12, FOLATE, FERRITIN, TIBC, IRON, RETICCTPCT in the last 72 hours. Sepsis Labs: No results for input(s): PROCALCITON, LATICACIDVEN in the last 168 hours.  No results found for this or any previous visit (from the past 240 hour(s)).   RN Pressure Injury Documentation:    Estimated body mass index is 25.69 kg/m as calculated from the following:   Height as of this encounter: _3  (1.676 m).   Weight as of this encounter: 72.2 kg.  Malnutrition Type:  Nutrition Problem: Severe Malnutrition Etiology: chronic illness (COPD)  Malnutrition  Characteristics:  Signs/Symptoms: severe fat depletion, severe muscle depletion  Nutrition Interventions:  Interventions: Refer to RD note for recommendations   Radiology Studies: No results found.  Scheduled Meds:  acetaminophen  500 mg Per Tube TID   allopurinol  100 mg Oral Daily   amLODipine  5 mg Oral Daily   arformoterol  15 mcg Nebulization BID   budesonide (PULMICORT) nebulizer solution  0.25 mg Nebulization BID   chlorhexidine gluconate (MEDLINE KIT)  15 mL Mouth Rinse BID   Chlorhexidine Gluconate Cloth  6 each Topical Daily   feeding supplement (PROSource TF)  45 mL Per Tube BID   free water  150 mL Per Tube Q4H   guaiFENesin  10 mL Oral Q8H   insulin aspart  0-9 Units Subcutaneous Q4H   lidocaine  1 application Urethral Once   mouth rinse  15 mL Mouth Rinse 10 times per day   nystatin  5 mL Oral QID   revefenacin  175 mcg Nebulization Daily   scopolamine  1 patch Transdermal Q72H   sennosides  5 mL Oral BID   sodium chloride flush  10-40 mL Intracatheter Q12H   Continuous Infusions:  feeding supplement (OSMOLITE 1.5 CAL) 1,000 mL (08/02/21 1629)    LOS: 33 days   Kerney Elbe, DO [facility] PAGER is on AMION  If 7PM-7AM, please contact night-coverage [email or web address]

## 2021-08-04 DIAGNOSIS — F0390 Unspecified dementia without behavioral disturbance: Secondary | ICD-10-CM | POA: Diagnosis not present

## 2021-08-04 DIAGNOSIS — J441 Chronic obstructive pulmonary disease with (acute) exacerbation: Secondary | ICD-10-CM | POA: Diagnosis not present

## 2021-08-04 DIAGNOSIS — I10 Essential (primary) hypertension: Secondary | ICD-10-CM | POA: Diagnosis not present

## 2021-08-04 DIAGNOSIS — J9601 Acute respiratory failure with hypoxia: Secondary | ICD-10-CM | POA: Diagnosis not present

## 2021-08-04 LAB — GLUCOSE, CAPILLARY
Glucose-Capillary: 106 mg/dL — ABNORMAL HIGH (ref 70–99)
Glucose-Capillary: 117 mg/dL — ABNORMAL HIGH (ref 70–99)
Glucose-Capillary: 130 mg/dL — ABNORMAL HIGH (ref 70–99)
Glucose-Capillary: 145 mg/dL — ABNORMAL HIGH (ref 70–99)
Glucose-Capillary: 156 mg/dL — ABNORMAL HIGH (ref 70–99)

## 2021-08-04 LAB — CBC WITH DIFFERENTIAL/PLATELET
Abs Immature Granulocytes: 0.04 10*3/uL (ref 0.00–0.07)
Basophils Absolute: 0 10*3/uL (ref 0.0–0.1)
Basophils Relative: 0 %
Eosinophils Absolute: 0.2 10*3/uL (ref 0.0–0.5)
Eosinophils Relative: 2 %
HCT: 31.7 % — ABNORMAL LOW (ref 39.0–52.0)
Hemoglobin: 9.8 g/dL — ABNORMAL LOW (ref 13.0–17.0)
Immature Granulocytes: 0 %
Lymphocytes Relative: 12 %
Lymphs Abs: 1.2 10*3/uL (ref 0.7–4.0)
MCH: 29.8 pg (ref 26.0–34.0)
MCHC: 30.9 g/dL (ref 30.0–36.0)
MCV: 96.4 fL (ref 80.0–100.0)
Monocytes Absolute: 0.9 10*3/uL (ref 0.1–1.0)
Monocytes Relative: 9 %
Neutro Abs: 7.8 10*3/uL — ABNORMAL HIGH (ref 1.7–7.7)
Neutrophils Relative %: 77 %
Platelets: 179 10*3/uL (ref 150–400)
RBC: 3.29 MIL/uL — ABNORMAL LOW (ref 4.22–5.81)
RDW: 13.7 % (ref 11.5–15.5)
WBC: 10.1 10*3/uL (ref 4.0–10.5)
nRBC: 0 % (ref 0.0–0.2)

## 2021-08-04 LAB — COMPREHENSIVE METABOLIC PANEL
ALT: 14 U/L (ref 0–44)
AST: 14 U/L — ABNORMAL LOW (ref 15–41)
Albumin: 2.4 g/dL — ABNORMAL LOW (ref 3.5–5.0)
Alkaline Phosphatase: 72 U/L (ref 38–126)
Anion gap: 8 (ref 5–15)
BUN: 32 mg/dL — ABNORMAL HIGH (ref 8–23)
CO2: 28 mmol/L (ref 22–32)
Calcium: 9 mg/dL (ref 8.9–10.3)
Chloride: 101 mmol/L (ref 98–111)
Creatinine, Ser: 0.8 mg/dL (ref 0.61–1.24)
GFR, Estimated: >60 mL/min (ref >60)
Glucose, Bld: 124 mg/dL — ABNORMAL HIGH (ref 70–99)
Potassium: 4.1 mmol/L (ref 3.5–5.1)
Sodium: 137 mmol/L (ref 135–145)
Total Bilirubin: 0.5 mg/dL (ref 0.3–1.2)
Total Protein: 6.2 g/dL — ABNORMAL LOW (ref 6.5–8.1)

## 2021-08-04 LAB — PHOSPHORUS: Phosphorus: 3.6 mg/dL (ref 2.5–4.6)

## 2021-08-04 LAB — MAGNESIUM: Magnesium: 2.3 mg/dL (ref 1.7–2.4)

## 2021-08-04 NOTE — TOC Progression Note (Addendum)
Transition of Care [facility]) - Progression Note    Patient Details  Name: NYLEN CREQUE MRN: [id number] Date of Birth: Nov 30, 1930  [facility]) CM/SW Contact  [name], LCSW Phone Number: 08/04/2021, 3:58 PM  Clinical Narrative:  CSW LM with Prem at [facility] to review pt for SNF admission.  CSW LM with Janie at [facility], also asking her to review.  CSW spoke with Rolla Plate, covering admissions for [facility] and she will review.     Expected Discharge Plan: [place] Barriers to Discharge: Continued Medical Work up, SNF Pending bed offer, Other (must enter comment) (pt has new trach placed on 07/02/21)  Expected Discharge Plan and Services Expected Discharge Plan: [place] Choice: [place] arrangements for the past 2 months: Single Family Home                                       Social Determinants of Health (SDOH) Interventions    Readmission Risk Interventions No flowsheet data found.

## 2021-08-04 NOTE — Progress Notes (Signed)
PROGRESS NOTE    Christopher Santiago  PJK:[id number] DOB: 09/13/1931 DOA: 07/01/2021 PCP: Tonia Ghent, MD   Brief Narrative:  The patient is an 87 year old elderly Caucasian male with a past medical history significant for but not limited to COPD who quit smoking 30 years ago, history of vocal cord cancer, diabetes mellitus type 2 as well as other comorbidities who presented with exertional dyspnea, reports sinusitis and postnasal drip and hoarseness for the last 3 to 4 months.  Is also complaining of difficulty swallowing of food getting stuck in his throat and he also reported phlegm in his throat.  CT maxillofacial soft tissue neck was done and showed asymmetric soft tissue density and fullness at the base of the right aryepiglottic fold extending right piriform sinus with possible involvement of the right glottis inferiorly.  Imaging studies was not able to determine if there is a discrete mass.  ENT was consulted for direct visualization and patient underwent laryngoscopy with biopsy and tracheostomy and he is also Status post PEG tube placement.  Further work-up revealed that he had squamous cell carcinoma of the larynx.  He has been worked up for [employer] but has been refused for inpatient rehabilitation.  SNF is being pursued and currently has a appointment with radiation oncology for a squamous cell carcinoma of the larynx however patient has decided not to pursue radiation treatment.  SLP continues to recommend n.p.o. and PT OT still recommending SNF with supervision assistance and a rolling walker with 5 inch wheels. Patient and Family to meet with Palliative Care at 1300 today to discuss further goals of care. This AM he had two Sinus pauses of 3.5 seconds so Cardiology was consulted for further evaluation and recommendations and because he was asymptomatic they are recommending no indication for pacing at this time.  He will awaiting placement at SNF as he is improved from a medical  standpoint.  Assessment & Plan:   Principal Problem:   Acute respiratory failure with hypoxia (HCC) Active Problems:   Essential hypertension   COPD with acute exacerbation (HCC)   Left renal mass   Respiratory failure, acute (HCC)   Protein-calorie malnutrition, severe   Tracheostomy status (HCC)   Tracheobronchitis   Dementia (HCC)  Acute Hypoxic Respiratory Failure, tracheostomy in place -Suspect related to component of aspiration due to dysphagia larynx CA, COPD exacerbation.   -He has completed 5 days of IV steroids  -Completed 7 days of cefepime with last day 8/21 -Respiratory culture with normal respiratory flora -ENT has been managing trach care -Patient is tolerating PM Valve most of the day, where his speech is fluent and very understandable. -SpO2: 99 % O2 Flow Rate (L/min): 5 L/min FiO2 (%): 21 % -Continue with scopolamine patch for secretions. -Continues to have secretions despite scopolamine patch.  Continues to require Suctioning -Repeat a chest x-ray 08/01/21 showed "The tracheostomy tube is stable. The cardiomediastinal silhouette is stable, with unchanged calcified atherosclerotic plaque of the thoracic aorta. Linear opacities in the left base likely reflect subsegmental atelectasis. There is no new focal airspace disease. The nodular density in the right upper lobe is unchanged, consistent with a calcified granuloma. There is no significant pleural effusion or pneumothorax. There is no acute osseous abnormality." -Seems to be stable for the most part.  He was on trach collar but is not hooked up to oxygen and saturating 100%   Sinus Bradycardia/Pauses -Noted to have sinus bradycardia on 9/3.   -Patient was completely asymptomatic.  Noted to  have pauses on telemetry but this is a chronic.  Not noted to be on any AV blocking medications. -Thyroid function tests are normal.  Electrolytes have been normal.  Bradycardia could have been due to excessive suctioning from a  vagal response. -Heart rate has been stable but he had another pause of 3.05 seconds overnight. -Continue to monitor and Cardiology not recommending doing anything given that he is asymptomatic   Squamous cell carcinoma Larynx -CT showed asymmetric soft tissue density fullness at the base of the right aryepiglottic fold. ENT was consulted. Patient underwent laryngoscopy with biopsy and tracheostomy 8/11. Stable on ATC 5 L oxygen.  -Biopsy showed squamous cell carcinoma with foci suspicious for invasion.  -Per ENT note when he was seen and evaluated initially, he was discussed at tumor board, will have outpatient follow-up with medical oncology, radiation oncology.  CT scan of the neck and chest showing that the pulmonary nodules have not progressed since 2019, therefore benign -Trach changed to Shiley 6 Prox XLT Cuffless.  Management per ENT -Patient to be followed at cancer center.  Patient's family is supposed to meet with radiation oncologist today and this was done at 1 PM -Per my discussion with social work cannot safely discharge the patient with his fresh tracheostomy as a will be difficult to find him placement -Palliative care has been consulted for further goals of care discussion after family discussion the family will continue to monitor for his quality of life and hospice has been discussed in the event that the patient does not tolerate cancer treatment or to not to proceed with a cancer therapy after meeting with oncology; patient no longer wants to proceed with radiation therapy -He wants to go home however he does not have a caregiver himself if he goes home with hospice given that his wife is sick and that he is a caregiver for his wife palliative to follow-up for goals of care discussion: Patient has acknowledged that he is too weak to care for his wife at this time and case management still working on SNF placement; he cannot safely be discharged without a safe plan in place -WBC has  gone up from 9.0 -> 7.8 -> 8.9 -> 11.9 -> 10.1 -Patient and family met with Palliative and Plan is to go to SNF and then enroll in Hospice after he leaves SNF   Acute Metabolic Encephalopathy -Had significant delirium initially but this appears to have resolved.  -Seems to be back to baseline.   1.2 cm spiculated right upper lobe nodule -Outpatient follow up with pulmonary and medical oncology if the patient desires    Left Renal Mass  -Concerning for renal carcinoma, will need to follow-up with urology as an outpatient.   -These findings were communicated to patient's daughter by previous providers.   Diabetes mellitus type II, controlled -HbA1c 5.8.   -Continue to Monitor CBGs.   -SSI. -CBGs ranging from 100-182   Essential Hypertension  -Blood pressure stable.  Noted to be on amlodipine. -Continue to monitor blood pressures per protocol -Last blood pressure reading was 116/44  Sinus Pauses -Had two 3.5 second pauses previously and now a 3.05 second pause overnight  -Cardiology consulted for further evaluation -They are recommending No intervention given that he is Asymptomatic   Normocytic Anemia -No evidence of overt bleeding.  Hemoglobin remained stable. -Last hemoglobin/hematocrit is stable at 10.1/32.4 and slightly dropped to 9.8/31.7 -Continue monitor for signs and symptoms of bleeding; currently no overt bleeding noted   Leukocytosis,  worsened slightly but now improved  -Probably related to steroids vs infection and had improved.  -Resolved initially but trended up to 11.9 from 8.9 but it is now 10.1 -If continues to worsen will Pan-Culture and obtain Cx from Tracheal Aspirate  -Continue to Monitor and Trend intermittently   Severe protein calorie malnutrition -Nutritionist consulted for further evaluation recommendations -Nutritional services recommending continuing tube feedings via PEG with Osmolite 1.5 at 55 MLS per hour and recommending Prosource tube feedings  at 45 mL twice daily and 150 mL free water flushes every 4 hours -Had issues with his TF Pump this AM but is now fixed  DVT prophylaxis: SCDs Code Status: DO NOT RESUSCITATE Family Communication: No family present at bedside Disposition Plan: Original plan was for the patient to go to SNF for now PT OT recommended recommendation for home health; may be able to send him home pending on his clinical improvement but he is too weak to go home so we will still go to SNF as he does not have a caregiver at home  Status is: Inpatient  Remains inpatient appropriate because:Unsafe d/c plan, IV treatments appropriate due to intensity of illness or inability to take PO, and Inpatient level of care appropriate due to severity of illness  Dispo: The patient is from: Home              Anticipated d/c is to: SNF              Patient currently is not medically stable to d/c.   Difficult to place patient No  Consultants:  ENT Trauma surgery for PEG Palliaitve  PCCM  Procedures:  Laryngoscopy / trach  ECHOCARDIOGRAM IMPRESSIONS   1. Technically difficult study, very poor visualization of left ventricle even after contrast administration. Left ventricular ejection fraction, by estimation, is 55 to 60%. The left ventricle has grossly normal function but very poorly visualized. Left ventricular endocardial border not optimally defined to evaluate regional wall motion. Left ventricular diastolic parameters are indeterminate.   2. Right ventricular systolic function is normal. The right ventricular size is mildly enlarged. Tricuspid regurgitation signal is inadequate for assessing PA pressure.   3. Right atrial size was moderately dilated.   4. A small pericardial effusion is present. The pericardial effusion is anterior to the right ventricle. Presence of pericardial fat pad.   5. The mitral valve is grossly normal. No evidence of mitral valve regurgitation.   6. The aortic valve was not well visualized.  Aortic valve regurgitation is not visualized. No aortic stenosis is present.   7. The inferior vena cava is normal in size with greater than 50% respiratory variability, suggesting right atrial pressure of 3 mmHg.   Antimicrobials:  Anti-infectives (From admission, onward)    Start     Dose/Rate Route Frequency Ordered Stop   07/12/21 2200  ceFEPIme (MAXIPIME) 2 g in sodium chloride 0.9 % 100 mL IVPB        2 g 200 mL/hr over 30 Minutes Intravenous Every 12 hours 07/12/21 1555 07/14/21 2359   07/10/21 2200  ceFEPIme (MAXIPIME) 2 g in sodium chloride 0.9 % 100 mL IVPB  Status:  Discontinued        2 g 200 mL/hr over 30 Minutes Intravenous Every 8 hours 07/10/21 1316 07/12/21 1555   07/08/21 0800  vancomycin (VANCOCIN) IVPB 1000 mg/200 mL premix  Status:  Discontinued        1,000 mg 200 mL/hr over 60 Minutes Intravenous Every 24 hours  07/07/21 0747 07/09/21 0855   07/07/21 0900  vancomycin (VANCOREADY) IVPB 1250 mg/250 mL        1,250 mg 166.7 mL/hr over 90 Minutes Intravenous  Once 07/07/21 0747 07/07/21 1412   07/07/21 0900  ceFEPIme (MAXIPIME) 2 g in sodium chloride 0.9 % 100 mL IVPB  Status:  Discontinued        2 g 200 mL/hr over 30 Minutes Intravenous Every 12 hours 07/07/21 0747 07/10/21 1316   07/07/21 0800  cefTRIAXone (ROCEPHIN) 1 g in sodium chloride 0.9 % 100 mL IVPB  Status:  Discontinued        1 g 200 mL/hr over 30 Minutes Intravenous Every 24 hours 07/07/21 0707 07/07/21 0709   07/06/21 1430  fluconazole (DIFLUCAN) IVPB 100 mg  Status:  Discontinued        100 mg 50 mL/hr over 60 Minutes Intravenous Every 24 hours 07/06/21 1341 07/06/21 1404   07/02/21 2100  cefTRIAXone (ROCEPHIN) 1 g in sodium chloride 0.9 % 100 mL IVPB        1 g 200 mL/hr over 30 Minutes Intravenous Every 24 hours 07/02/21 0114 07/06/21 2325   07/01/21 2145  cefTRIAXone (ROCEPHIN) 1 g in sodium chloride 0.9 % 100 mL IVPB        1 g 200 mL/hr over 30 Minutes Intravenous  Once 07/01/21 2141 07/01/21  2235   07/01/21 2145  azithromycin (ZITHROMAX) 500 mg in sodium chloride 0.9 % 250 mL IVPB        500 mg 250 mL/hr over 60 Minutes Intravenous  Once 07/01/21 2141 07/02/21 0000        Subjective: Seen and examined at bedside and he feels about the same.  No chest pain.  Denies any lightheadedness or dizziness.  No other concerns or points this time.  Objective: Vitals:   08/04/21 0400 08/04/21 0419 08/04/21 0500 08/04/21 1144  BP: (!) 116/44 (!) 116/44    Pulse: (!) 51 69    Resp: (!) 24 (!) 21    Temp:      TempSrc:      SpO2: 95% 97%  99%  Weight:   67.9 kg   Height:        Intake/Output Summary (Last 24 hours) at 08/04/2021 1533 Last data filed at 08/04/2021 6962 Gross per 24 hour  Intake --  Output 1450 ml  Net -1450 ml    Filed Weights   07/29/21 0327 08/02/21 0838 08/04/21 0500  Weight: 71.2 kg 72.2 kg 67.9 kg   Examination: Physical Exam: Constitutional: The patient is a thin elderly chronically ill-appearing Caucasian male currently in no acute distress Eyes: Lids and conjunctivae normal, sclerae anicteric  ENMT: External Ears, Nose appear normal. Grossly normal hearing.  Neck: Has a tracheostomy not connected oxygen Respiratory: Diminished to auscultation bilaterally with coarse breath sounds, no wheezing, rales, rhonchi or crackles. Normal respiratory effort and patient is not tachypenic. No accessory muscle use.  Unlabored breathing Cardiovascular: RRR, no murmurs / rubs / gallops. S1 and S2 auscultated. No extremity edema.  Abdomen: Soft, non-tender, non-distended. Bowel sounds positive.  GU: Deferred. Musculoskeletal: No clubbing / cyanosis of digits/nails. No joint deformity upper and lower extremities.  Skin: No rashes, lesions, ulcers on limited skin evaluation. No induration; Warm and dry.  Neurologic: CN 2-12 grossly intact with no focal deficits. Romberg sign and cerebellar reflexes not assessed.  Psychiatric: Normal judgment and insight. Alert and  oriented x 3. Normal mood and appropriate affect.   Data Reviewed:  I have personally reviewed following labs and imaging studies  CBC: Recent Labs  Lab 07/31/21 0347 08/01/21 0103 08/03/21 0350 08/04/21 0222  WBC 7.8 8.9 11.9* 10.1  NEUTROABS 5.4 6.7 9.9* 7.8*  HGB 10.3* 9.9* 10.1* 9.8*  HCT 31.8* 31.9* 32.4* 31.7*  MCV 93.8 95.2 95.9 96.4  PLT 194 187 181 638    Basic Metabolic Panel: Recent Labs  Lab 07/31/21 0347 08/01/21 0103 08/03/21 0350 08/04/21 0222  NA 136 136 139 137  K 4.2 4.2 4.3 4.1  CL 102 102 102 101  CO2 27 28 29 28   GLUCOSE 156* 130* 173* 124*  BUN 40* 33* 35* 32*  CREATININE 0.84 0.73 0.81 0.80  CALCIUM 9.0 9.2 9.1 9.0  MG 2.2 2.2 2.2 2.3  PHOS 3.8 3.7 3.2 3.6    GFR: Estimated Creatinine Clearance: 57.6 mL/min (by C-G formula based on SCr of 0.8 mg/dL). Liver Function Tests: Recent Labs  Lab 07/31/21 0347 08/01/21 0103 08/03/21 0350 08/04/21 0222  AST 17 16 15  14*  ALT 24 20 15 14   ALKPHOS 67 67 79 72  BILITOT 0.6 0.6 0.4 0.5  PROT 6.4* 6.5 6.5 6.2*  ALBUMIN 2.5* 2.6* 2.6* 2.4*    No results for input(s): LIPASE, AMYLASE in the last 168 hours. No results for input(s): AMMONIA in the last 168 hours. Coagulation Profile: No results for input(s): INR, PROTIME in the last 168 hours. Cardiac Enzymes: No results for input(s): CKTOTAL, CKMB, CKMBINDEX, TROPONINI in the last 168 hours. BNP (last 3 results) Recent Labs    11/08/20 1106  PROBNP 482.0*    HbA1C: No results for input(s): HGBA1C in the last 72 hours. CBG: Recent Labs  Lab 08/03/21 1604 08/03/21 2045 08/04/21 0052 08/04/21 0454 08/04/21 1205  GLUCAP 100* 182* 145* 130* 156*    Lipid Profile: No results for input(s): CHOL, HDL, LDLCALC, TRIG, CHOLHDL, LDLDIRECT in the last 72 hours. Thyroid Function Tests: No results for input(s): TSH, T4TOTAL, FREET4, T3FREE, THYROIDAB in the last 72 hours. Anemia Panel: No results for input(s): VITAMINB12, FOLATE, FERRITIN,  TIBC, IRON, RETICCTPCT in the last 72 hours. Sepsis Labs: No results for input(s): PROCALCITON, LATICACIDVEN in the last 168 hours.  No results found for this or any previous visit (from the past 240 hour(s)).   RN Pressure Injury Documentation:    Estimated body mass index is 24.16 kg/m as calculated from the following:   Height as of this encounter: 5' 6"  (1.676 m).   Weight as of this encounter: 67.9 kg.  Malnutrition Type:  Nutrition Problem: Severe Malnutrition Etiology: chronic illness (COPD)  Malnutrition Characteristics:  Signs/Symptoms: severe fat depletion, severe muscle depletion  Nutrition Interventions:  Interventions: Refer to RD note for recommendations   Radiology Studies: No results found.  Scheduled Meds:  acetaminophen  500 mg Per Tube TID   allopurinol  100 mg Oral Daily   amLODipine  5 mg Oral Daily   arformoterol  15 mcg Nebulization BID   budesonide (PULMICORT) nebulizer solution  0.25 mg Nebulization BID   chlorhexidine gluconate (MEDLINE KIT)  15 mL Mouth Rinse BID   Chlorhexidine Gluconate Cloth  6 each Topical Daily   feeding supplement (PROSource TF)  45 mL Per Tube BID   free water  150 mL Per Tube Q4H   guaiFENesin  10 mL Oral Q8H   insulin aspart  0-9 Units Subcutaneous Q4H   lidocaine  1 application Urethral Once   mouth rinse  15 mL Mouth Rinse 10 times per  day   nystatin  5 mL Oral QID   revefenacin  175 mcg Nebulization Daily   scopolamine  1 patch Transdermal Q72H   sennosides  5 mL Oral BID   sodium chloride flush  10-40 mL Intracatheter Q12H   Continuous Infusions:  feeding supplement (OSMOLITE 1.5 CAL) 1,000 mL (08/02/21 1629)    LOS: 34 days   Kerney Elbe, DO [facility] PAGER is on AMION  If 7PM-7AM, please contact night-coverage [email or web address]

## 2021-08-04 NOTE — Progress Notes (Addendum)
CCMD called, pt had a missed beat and pause of 3.05 sec.  Pt asymptomatic, no change in mental status, denied chest pain and SOB, VS stable. MD on call notified. No new orders at this time, will continue to monitor pt.

## 2021-08-04 NOTE — Progress Notes (Signed)
Occupational Therapy Treatment Patient Details Name: Christopher Santiago MRN: [id number] DOB: [birth year] Today's Date: 08/04/2021   History of present illness 87 y/o male admitted 8/9 secondary to worsening SOB, especially with exertion. Found to have acute respiratory failure with hypoxia. Imaging showed fullness of the right aryepiglottic fold extending into the right pyriform and L renal mass. Pt found to have left renal mass 8/9. Pt had Flexible laryngoscopy on 8/10 with trach. PEG 8/23. Acute course complicated by metabolic encephalopathy. PMHx:vocal cord cancer, COPD, CAD, DM, HTN, and R THA, gout, PVD, BPH.   OT comments  Patient up in chair and asked to get up to walk.  Patient performed static standing from recliner with min guard and ambulated with rw with min guard.  Patient performed toilet transfer to [facility] and required assistance with hygiene.  Patient remained in recliner after session.  Acute OT to continue to follow.    Recommendations for follow up therapy are one component of a multi-disciplinary discharge planning process, led by the attending physician.  Recommendations may be updated based on patient status, additional functional criteria and insurance authorization.    Follow Up Recommendations  CIR    Equipment Recommendations  Other (comment)    Recommendations for Other Services Rehab consult    Precautions / Restrictions Precautions Precautions: Fall Precaution Comments: trach, peg Restrictions Weight Bearing Restrictions: No       Mobility Bed Mobility Overal bed mobility: Needs Assistance Bed Mobility: Supine to Sit     Supine to sit: Min guard;HOB elevated     General bed mobility comments: Patient up in chair this visit    Transfers Overall transfer level: Needs assistance Equipment used: Rolling walker (2 wheeled) Transfers: Sit to/from [employer] Sit to Stand: Min guard Stand pivot transfers: Min guard       General transfer  comment: uses rw for all transfers    Balance Overall balance assessment: Needs assistance Sitting-balance support: Feet supported;No upper extremity supported Sitting balance-Leahy Scale: Good Sitting balance - Comments: Patient up in recliner   Standing balance support: During functional activity;Bilateral upper extremity supported Standing balance-Leahy Scale: Fair Standing balance comment: stood for toilet hygiene                           ADL either performed or assessed with clinical judgement   ADL Overall ADL's : Needs assistance/impaired                         Toilet Transfer: RW;Min Psychiatric nurse Details (indicate cue type and reason): transfer to [place] and Hygiene: Maximal assistance Toileting - Clothing Manipulation Details (indicate cue type and reason): max assist for toilet hygiene while standing       General ADL Comments: Patient stating he is having frequent BMs     Vision       Perception     Praxis      Cognition Arousal/Alertness: Awake/alert Behavior During Therapy: WFL for tasks assessed/performed Overall Cognitive Status: Within Functional Limits for tasks assessed                                 General Comments: patient was able to recall therapist from past visit        Exercises     Shoulder Instructions       General  Comments HR 81-95    Pertinent Vitals/ Pain       Pain Assessment: No/denies pain  Home Living                                          Prior Functioning/Environment              Frequency  Min 2X/week        Progress Toward Goals  OT Goals(current goals can now be found in the care plan section)  Progress towards OT goals: Progressing toward goals  Acute Rehab OT Goals Patient Stated Goal: to go to SNF OT Goal Formulation: With patient Time For Goal Achievement: 07/31/21 Potential to Achieve Goals:  Good ADL Goals Pt Will Perform Eating: Independently Pt Will Perform Grooming: with modified independence;standing Pt Will Perform Upper Body Dressing: Independently Pt Will Perform Lower Body Dressing: with modified independence;sit to/from stand Pt Will Transfer to Toilet: with modified independence;ambulating Pt Will Perform Toileting - Clothing Manipulation and hygiene: with modified independence;sit to/from stand Pt Will Perform Tub/Shower Transfer: Shower transfer;Tub transfer;rolling walker;shower seat Pt/caregiver will Perform Home Exercise Program: Increased ROM;Increased strength;Both right and left upper extremity  Plan Discharge plan remains appropriate;Frequency remains appropriate    Co-evaluation                 AM-PAC OT "6 Clicks" Daily Activity     Outcome Measure   Help from another person eating meals?: Total Help from another person taking care of personal grooming?: A Little Help from another person toileting, which includes using toliet, bedpan, or urinal?: A Little Help from another person bathing (including washing, rinsing, drying)?: A Little Help from another person to put on and taking off regular upper body clothing?: A Little Help from another person to put on and taking off regular lower body clothing?: A Little 6 Click Score: 16    End of Session Equipment Utilized During Treatment: Oxygen;Rolling walker  OT Visit Diagnosis: Unsteadiness on feet (R26.81);Muscle weakness (generalized) (M62.81)   Activity Tolerance Patient tolerated treatment well   Patient Left in chair;with call bell/phone within reach;with chair alarm set   Nurse Communication Mobility status        Time: 1030-1056 OT Time Calculation (min): 26 min  Charges: OT General Charges $OT Visit: 1 Visit OT Treatments $Self Care/Home Management : 8-22 mins $Therapeutic Activity: 8-22 mins  Lodema Hong, OTA   [place] Stanger Alexis Goodell 08/04/2021, 11:52 AM

## 2021-08-04 NOTE — Progress Notes (Signed)
Physical Therapy Treatment Patient Details Name: Christopher Santiago MRN: [id number] DOB: 11-28-1930 Today's Date: 08/04/2021   History of Present Illness 87 y/o male admitted 8/9 secondary to worsening SOB, especially with exertion. Found to have acute respiratory failure with hypoxia. Imaging showed fullness of the right aryepiglottic fold extending into the right pyriform and L renal mass. Pt found to have left renal mass 8/9. Pt had Flexible laryngoscopy on 8/10 with trach. PEG 8/23. Acute course complicated by metabolic encephalopathy. PMHx:vocal cord cancer, COPD, CAD, DM, HTN, and R THA, gout, PVD, BPH.    PT Comments    Pt tolerated treatment well and ambulated in room due to frequent urge for toileting and quickly fatiguing. Pt performed multiple transfers and was able to maintain static standing balance at sink and perform stand pivot transfer without BUE support and RW. Continue to recommend SNF given pt's current functional status and limited caregiver support.     Recommendations for follow up therapy are one component of a multi-disciplinary discharge planning process, led by the attending physician.  Recommendations may be updated based on patient status, additional functional criteria and insurance authorization.  Follow Up Recommendations  SNF;Supervision/Assistance - 24 hour     Equipment Recommendations  Rolling walker with 5" wheels    Recommendations for Other Services       Precautions / Restrictions Precautions Precautions: Fall Precaution Comments: trach, peg Restrictions Weight Bearing Restrictions: No     Mobility  Bed Mobility Overal bed mobility: Needs Assistance Bed Mobility: Supine to Sit     Supine to sit: Min guard;HOB elevated          Transfers Overall transfer level: Needs assistance Equipment used: Rolling walker (2 wheeled) Transfers: Sit to/from [employer] Sit to Stand: Min guard Stand pivot transfers: Min guard        General transfer comment: Constant urge for toileting during session. Sit to stand x6: from bed, 3 BSC, 2 recliner > Min guard for safety. Stand pivot x2 to [facility]. Last trial increased trunk flexion flexion and performed without RW as BSC moved closer to pt. Good recall of cues for hand placement. Slow, steady pace.  Ambulation/Gait Ambulation/Gait assistance: Min guard Gait Distance (Feet): 4 Feet         General Gait Details: 4' to sink 2' x3 BSC to recliner. Last trial performed without RW and increased trunk flexion   Stairs             Wheelchair Mobility    Modified Rankin (Stroke Patients Only)       Balance Overall balance assessment: Needs assistance Sitting-balance support: Feet supported;No upper extremity supported Sitting balance-Leahy Scale: Good     Standing balance support: During functional activity;Bilateral upper extremity supported Standing balance-Leahy Scale: Fair Standing balance comment: Able to maintain static standing at sink without BUE support. Relies on BUE support on RW with ambulation                            Cognition Arousal/Alertness: Awake/alert Behavior During Therapy: WFL for tasks assessed/performed Overall Cognitive Status: Within Functional Limits for tasks assessed                                        Exercises      General Comments General comments (skin integrity, edema, etc.): HR 81-95  Pertinent Vitals/Pain Pain Assessment: No/denies pain    Home Living                      Prior Function            PT Goals (current goals can now be found in the care plan section) Progress towards PT goals: Progressing toward goals    Frequency    Min 3X/week      PT Plan Current plan remains appropriate    Co-evaluation              AM-PAC PT "6 Clicks" Mobility   Outcome Measure  Help needed turning from your back to your side while in a flat bed without  using bedrails?: A Little Help needed moving from lying on your back to sitting on the side of a flat bed without using bedrails?: A Little Help needed moving to and from a bed to a chair (including a wheelchair)?: A Little Help needed standing up from a chair using your arms (e.g., wheelchair or bedside chair)?: A Little Help needed to walk in hospital room?: A Little Help needed climbing 3-5 steps with a railing? : A Little 6 Click Score: 18    End of Session Equipment Utilized During Treatment: Gait belt Activity Tolerance: Patient tolerated treatment well;Patient limited by fatigue Patient left: in chair;with call bell/phone within reach;with chair alarm set Nurse Communication: Mobility status PT Visit Diagnosis: Muscle weakness (generalized) (M62.81)     Time: 6712-4580 PT Time Calculation (min) (ACUTE ONLY): 53 min  Charges:  $Therapeutic Activity: 53-67 mins                     Louie Casa, SPT Acute Rehab: [phone]    Domingo Dimes 08/04/2021, 9:02 AM

## 2021-08-05 DIAGNOSIS — I10 Essential (primary) hypertension: Secondary | ICD-10-CM | POA: Diagnosis not present

## 2021-08-05 DIAGNOSIS — J441 Chronic obstructive pulmonary disease with (acute) exacerbation: Secondary | ICD-10-CM | POA: Diagnosis not present

## 2021-08-05 DIAGNOSIS — J9601 Acute respiratory failure with hypoxia: Secondary | ICD-10-CM | POA: Diagnosis not present

## 2021-08-05 DIAGNOSIS — F0390 Unspecified dementia without behavioral disturbance: Secondary | ICD-10-CM | POA: Diagnosis not present

## 2021-08-05 LAB — URINALYSIS, ROUTINE W REFLEX MICROSCOPIC
Bilirubin Urine: NEGATIVE
Glucose, UA: NEGATIVE mg/dL
Ketones, ur: NEGATIVE mg/dL
Nitrite: NEGATIVE
Protein, ur: 100 mg/dL — AB
RBC / HPF: >50 RBC/hpf — ABNORMAL HIGH (ref 0–5)
Specific Gravity, Urine: 1.021 (ref 1.005–1.030)
WBC, UA: >50 WBC/hpf — ABNORMAL HIGH (ref 0–5)
pH: 6 (ref 5.0–8.0)

## 2021-08-05 LAB — GLUCOSE, CAPILLARY
Glucose-Capillary: 112 mg/dL — ABNORMAL HIGH (ref 70–99)
Glucose-Capillary: 117 mg/dL — ABNORMAL HIGH (ref 70–99)
Glucose-Capillary: 120 mg/dL — ABNORMAL HIGH (ref 70–99)
Glucose-Capillary: 122 mg/dL — ABNORMAL HIGH (ref 70–99)
Glucose-Capillary: 123 mg/dL — ABNORMAL HIGH (ref 70–99)
Glucose-Capillary: 142 mg/dL — ABNORMAL HIGH (ref 70–99)
Glucose-Capillary: 147 mg/dL — ABNORMAL HIGH (ref 70–99)

## 2021-08-05 MED ORDER — POLYETHYLENE GLYCOL 3350 17 G PO PACK
17.0000 g | PACK | Freq: Two times a day (BID) | ORAL | Status: DC
  Administered 2021-08-05 – 2021-08-11 (×11): 17 g
  Filled 2021-08-05 (×12): qty 1

## 2021-08-05 MED ORDER — SENNOSIDES-DOCUSATE SODIUM 8.6-50 MG PO TABS
1.0000 | ORAL_TABLET | Freq: Two times a day (BID) | ORAL | Status: DC
  Administered 2021-08-05 – 2021-08-11 (×11): 1
  Filled 2021-08-05 (×12): qty 1

## 2021-08-05 NOTE — Progress Notes (Signed)
RT spoke with ENT about 2 week trach change due today. ENT is ok with patient keeping trach in 4 weeks at a time. Next routine change for patient will be due 08/19/21.

## 2021-08-05 NOTE — TOC Progression Note (Addendum)
Transition of Care [facility]) - Progression Note    Patient Details  Name: Christopher Santiago MRN: [id number] Date of Birth: [birth year]  [facility] CM/SW Contact  [name], [place] Phone Number: 08/05/2021, 1:49 PM  Clinical Narrative:   CSW reached out to [facility] [place] today regarding their review of this referral.  No response so far.   1550: TC [facility].  They cannot accept [facility] medicare.   1600: TC LM with Admissions at [facility].    Expected Discharge Plan: [place] Barriers to Discharge: Continued Medical Work up, SNF Pending bed offer, Other (must enter comment) (pt has new trach placed on 07/02/21)  Expected Discharge Plan and Services Expected Discharge Plan: [place] Choice: [place] arrangements for the past 2 months: Single Family Home                                       Social Determinants of Health (SDOH) Interventions    Readmission Risk Interventions No flowsheet data found.

## 2021-08-05 NOTE — Progress Notes (Signed)
Per, Alfredia Ferguson MD, ok for pt not to have an IV.

## 2021-08-05 NOTE — Plan of Care (Signed)
  Problem: Education: Goal: Knowledge of General Education information will improve Description: Including pain rating scale, medication(s)/side effects and non-pharmacologic comfort measures 08/05/2021 0509 by Loma Messing, RN Outcome: Not Progressing 08/05/2021 0507 by Loma Messing, RN Outcome: Not Progressing   Problem: Health Behavior/Discharge Planning: Goal: Ability to manage health-related needs will improve 08/05/2021 0509 by Loma Messing, RN Outcome: Not Progressing 08/05/2021 0507 by Loma Messing, RN Outcome: Not Progressing   Problem: Clinical Measurements: Goal: Ability to maintain clinical measurements within normal limits will improve 08/05/2021 0509 by Loma Messing, RN Outcome: Not Progressing 08/05/2021 0507 by Loma Messing, RN Outcome: Not Progressing Goal: Will remain free from infection 08/05/2021 0509 by Loma Messing, RN Outcome: Not Progressing 08/05/2021 0507 by Loma Messing, RN Outcome: Not Progressing Goal: Diagnostic test results will improve 08/05/2021 0509 by Loma Messing, RN Outcome: Not Progressing 08/05/2021 0507 by Karsten Fells D, RN Outcome: Not Progressing Goal: Respiratory complications will improve 08/05/2021 0509 by Loma Messing, RN Outcome: Not Progressing 08/05/2021 0507 by Karsten Fells D, RN Outcome: Not Progressing Goal: Cardiovascular complication will be avoided 08/05/2021 0509 by Loma Messing, RN Outcome: Not Progressing 08/05/2021 0507 by Loma Messing, RN Outcome: Not Progressing   Problem: Activity: Goal: Risk for activity intolerance will decrease 08/05/2021 0509 by Loma Messing, RN Outcome: Not Progressing 08/05/2021 0507 by Karsten Fells D, RN Outcome: Not Progressing   Problem: Coping: Goal: Level of anxiety will decrease 08/05/2021 0509 by Loma Messing, RN Outcome: Not Progressing 08/05/2021 0507 by Loma Messing, RN Outcome: Not  Progressing   Problem: Elimination: Goal: Will not experience complications related to bowel motility 08/05/2021 0509 by Loma Messing, RN Outcome: Not Progressing 08/05/2021 0507 by Loma Messing, RN Outcome: Not Progressing Goal: Will not experience complications related to urinary retention 08/05/2021 0509 by Loma Messing, RN Outcome: Not Progressing 08/05/2021 0507 by Loma Messing, RN Outcome: Not Progressing   Problem: Safety: Goal: Ability to remain free from injury will improve 08/05/2021 0509 by Loma Messing, RN Outcome: Not Progressing 08/05/2021 0507 by Loma Messing, RN Outcome: Not Progressing   Problem: Skin Integrity: Goal: Risk for impaired skin integrity will decrease 08/05/2021 0509 by Loma Messing, RN Outcome: Not Progressing 08/05/2021 0507 by Loma Messing, RN Outcome: Not Progressing   Problem: Education: Goal: Knowledge about tracheostomy care/management will improve 08/05/2021 0509 by Loma Messing, RN Outcome: Not Progressing 08/05/2021 0507 by Loma Messing, RN Outcome: Not Progressing

## 2021-08-05 NOTE — Plan of Care (Signed)

## 2021-08-05 NOTE — Plan of Care (Signed)
  Problem: Education: Goal: Knowledge of General Education information will improve Description: Including pain rating scale, medication(s)/side effects and non-pharmacologic comfort measures Outcome: Progressing   Problem: Health Behavior/Discharge Planning: Goal: Ability to manage health-related needs will improve Outcome: Progressing   Problem: Clinical Measurements: Goal: Ability to maintain clinical measurements within normal limits will improve Outcome: Progressing Goal: Will remain free from infection Outcome: Progressing Goal: Diagnostic test results will improve Outcome: Progressing Goal: Respiratory complications will improve Outcome: Progressing Goal: Cardiovascular complication will be avoided Outcome: Progressing   Problem: Activity: Goal: Risk for activity intolerance will decrease Outcome: Progressing   Problem: Nutrition: Goal: Adequate nutrition will be maintained Outcome: Progressing   Problem: Coping: Goal: Level of anxiety will decrease Outcome: Progressing   Problem: Elimination: Goal: Will not experience complications related to bowel motility Outcome: Progressing Goal: Will not experience complications related to urinary retention Outcome: Progressing   Problem: Pain Managment: Goal: General experience of comfort will improve Outcome: Progressing   Problem: Safety: Goal: Ability to remain free from injury will improve Outcome: Progressing   Problem: Skin Integrity: Goal: Risk for impaired skin integrity will decrease Outcome: Progressing   Problem: Education: Goal: Knowledge about tracheostomy care/management will improve Outcome: Progressing   Problem: Activity: Goal: Ability to tolerate increased activity will improve Outcome: Progressing   Problem: Health Behavior/Discharge Planning: Goal: Ability to manage tracheostomy will improve Outcome: Progressing   Problem: Respiratory: Goal: Patent airway maintenance will  improve Outcome: Progressing   Problem: Role Relationship: Goal: Ability to communicate will improve Outcome: Progressing   Problem: Safety: Goal: Non-violent Restraint(s) Outcome: Progressing

## 2021-08-05 NOTE — Progress Notes (Signed)
  Speech Language Pathology Treatment: Nada Boozer Speaking valve  Patient Details Name: Christopher Santiago MRN: [id number] DOB: 1931-07-28 Today's Date: 08/05/2021 Time: 0174-9449 SLP Time Calculation (min) (ACUTE ONLY): 16 min  Assessment / Plan / Recommendation Clinical Impression  SLP followed up for continued PMSV training. Pt with PMSV in place upon SLP arrival, tolerating well on trach collar. Pt was able to demonstrate donning/doffing of PMSV with mild cueing from SLP. Pt with stronger cough, able to expectorate secretions both orally and at trach site using yankauer to remove. Pt communicating at the phrase and sentence level with improved vocal intensity for communication and good intelligibility. Discussed swallowing hx since hospitalization. Will plan for PO trials next session to evaluate if pt would benefit from future repeat instrumental assessment. Pt would like to reinitiate PO diet if safely able to. Will continue to follow.    HPI HPI: Pt is an 87 y/o male admitted 8/10 secondary to worsening SOB, especially with exertion. Found to have acute respiratory failure with hypoxia. Imaging showed fullness of the right aryepiglottic fold extending into the right pyriform and L renal mass. Pt had flexible laryngoscopy on 8/10, including trach.  Findings revealed bilateral true vocal folds in paramedian position with normal adduction, restricted abduction causing narrowing of the airway. No evidence of airway obstruction. Mucosal abnormality/ edema of right supraglottis/glottis noted with no exophytic mass lesion, biopsy showed squamous cell carcinoma. PEG placement 07/15/21.  PMH includes vocal cord cancer ~ 30 years ago, COPD, CAD, DM, HTN, R THA, bilateral hearing aids.      SLP Plan  Continue with current plan of care       Recommendations  Diet recommendations: NPO Medication Administration: Via alternative means      Patient may use Passy-Muir Speech Valve: During all waking hours  (remove during sleep) PMSV Supervision: Intermittent MD: Please consider changing trach tube to : Smaller size         Oral Care Recommendations: Oral care QID Follow up Recommendations: Skilled Nursing facility SLP Visit Diagnosis: Aphonia (R49.1) Plan: Continue with current plan of care       GO                Hayden Rasmussen MA, CCC-SLP Acute Rehabilitation Services   08/05/2021, 10:10 AM

## 2021-08-05 NOTE — Progress Notes (Signed)
PROGRESS NOTE    Christopher Santiago  MRN:[id number] DOB: 12/24/1930 DOA: 07/01/2021 PCP: Duncan, Graham S, MD   Brief Narrative:  The patient is an 87-year-old elderly Caucasian male with a past medical history significant for but not limited to COPD who quit smoking 30 years ago, history of vocal cord cancer, diabetes mellitus type 2 as well as other comorbidities who presented with exertional dyspnea, reports sinusitis and postnasal drip and hoarseness for the last 3 to 4 months.  Is also complaining of difficulty swallowing of food getting stuck in his throat and he also reported phlegm in his throat.  CT maxillofacial soft tissue neck was done and showed asymmetric soft tissue density and fullness at the base of the right aryepiglottic fold extending right piriform sinus with possible involvement of the right glottis inferiorly.  Imaging studies was not able to determine if there is a discrete mass.  ENT was consulted for direct visualization and patient underwent laryngoscopy with biopsy and tracheostomy and he is also Status post PEG tube placement.  Further work-up revealed that he had squamous cell carcinoma of the larynx.  He has been worked up for CIR but has been refused for inpatient rehabilitation.  SNF is being pursued and currently has a appointment with radiation oncology for a squamous cell carcinoma of the larynx however patient has decided not to pursue radiation treatment.  SLP continues to recommend n.p.o. and PT OT still recommending SNF with supervision assistance and a rolling walker with 5 inch wheels. Patient and Family to meet with Palliative Care at 1300 today to discuss further goals of care. This AM he had two Sinus pauses of 3.5 seconds so Cardiology was consulted for further evaluation and recommendations and because he was asymptomatic they are recommending no indication for pacing at this time.  He will awaiting placement at SNF as he is improved from a medical  standpoint.  Assessment & Plan:   Principal Problem:   Acute respiratory failure with hypoxia (HCC) Active Problems:   Essential hypertension   COPD with acute exacerbation (HCC)   Left renal mass   Respiratory failure, acute (HCC)   Protein-calorie malnutrition, severe   Tracheostomy status (HCC)   Tracheobronchitis   Dementia (HCC)  Acute Hypoxic Respiratory Failure, tracheostomy in place -Suspect related to component of aspiration due to dysphagia larynx CA, COPD exacerbation.   -He has completed 5 days of IV steroids  -Completed 7 days of cefepime with last day 8/21 -Respiratory culture with normal respiratory flora -ENT has been managing trach care -Patient is tolerating PM Valve most of the day, where his speech is fluent and very understandable. -SpO2: 97 % O2 Flow Rate (L/min): 5 L/min FiO2 (%): 21 % -Continue with scopolamine patch for secretions. -Continues to have secretions despite scopolamine patch.  Continues to require Suctioning -Repeat a chest x-ray 08/01/21 showed "The tracheostomy tube is stable. The cardiomediastinal silhouette is stable, with unchanged calcified atherosclerotic plaque of the thoracic aorta. Linear opacities in the left base likely reflect subsegmental atelectasis. There is no new focal airspace disease. The nodular density in the right upper lobe is unchanged, consistent with a calcified granuloma. There is no significant pleural effusion or pneumothorax. There is no acute osseous abnormality." -Seems to be stable for the most part.  He was on trach collar but is not hooked up to oxygen and saturating 100%; trach was supposed to be changed today due to 2-week trach change however ENT recommended keeping the trach in place   for 4 weeks at a time with the next routine trach change at 08/19/2021   Sinus Bradycardia/Pauses -Noted to have sinus bradycardia on 9/3.   -Patient was completely asymptomatic.  Noted to have pauses on telemetry but this is a  chronic.  Not noted to be on any AV blocking medications. -Thyroid function tests are normal.  Electrolytes have been normal.  Bradycardia could have been due to excessive suctioning from a vagal response. -Heart rate has been stable but he had another pause of 3.05 seconds overnight. -Continue to monitor and Cardiology not recommending doing anything given that he is asymptomatic   Squamous cell carcinoma Larynx -CT showed asymmetric soft tissue density fullness at the base of the right aryepiglottic fold. ENT was consulted. Patient underwent laryngoscopy with biopsy and tracheostomy 8/11. Stable on ATC 5 L oxygen.  -Biopsy showed squamous cell carcinoma with foci suspicious for invasion.  -Per ENT note when he was seen and evaluated initially, he was discussed at tumor board, will have outpatient follow-up with medical oncology, radiation oncology.  CT scan of the neck and chest showing that the pulmonary nodules have not progressed since 2019, therefore benign -Trach changed to Shiley 6 Prox XLT Cuffless.  Management per ENT -Patient to be followed at cancer center.  Patient's family is supposed to meet with radiation oncologist today and this was done at 1 PM -Per my discussion with social work cannot safely discharge the patient with his fresh tracheostomy as a will be difficult to find him placement -Palliative care has been consulted for further goals of care discussion after family discussion the family will continue to monitor for his quality of life and hospice has been discussed in the event that the patient does not tolerate cancer treatment or to not to proceed with a cancer therapy after meeting with oncology; patient no longer wants to proceed with radiation therapy -He wants to go home however he does not have a caregiver himself if he goes home with hospice given that his wife is sick and that he is a caregiver for his wife palliative to follow-up for goals of care discussion: Patient  has acknowledged that he is too weak to care for his wife at this time and case management still working on SNF placement; he cannot safely be discharged without a safe plan in place -WBC has gone up from 9.0 -> 7.8 -> 8.9 -> 11.9 -> 10.1 on last check  -Patient and family met with Palliative and Plan is to go to SNF and then enroll in Hospice after he leaves SNF   Acute Metabolic Encephalopathy -Had significant delirium initially but this appears to have resolved.  -Seems to be back to baseline.   1.2 cm spiculated right upper lobe nodule -Outpatient follow up with pulmonary and medical oncology if the patient desires    Left Renal Mass  -Concerning for renal carcinoma, will need to follow-up with urology as an outpatient.   -These findings were communicated to patient's daughter by previous providers.   Diabetes mellitus type II, controlled -HbA1c 5.8.   -Continue to Monitor CBGs.   -SSI. -CBGs ranging from 100-182   Essential Hypertension  -Blood pressure stable.  Noted to be on amlodipine. -Continue to monitor blood pressures per protocol -Last blood pressure reading was 121/58  Sinus Pauses -Had two 3.5 second pauses previously and now a 3.05 second pause overnight  -Cardiology consulted for further evaluation -They are recommending No intervention given that he is Asymptomatic     Normocytic Anemia -No evidence of overt bleeding.  Hemoglobin remained stable. -Last hemoglobin/hematocrit is stable at 10.1/32.4 and slightly dropped to 9.8/31.7 on last check  -Continue monitor for signs and symptoms of bleeding; currently no overt bleeding noted   Leukocytosis, worsened slightly but now improved  -Probably related to steroids vs infection and had improved.  -Resolved initially but trended up to 11.9 from 8.9 but it is now 10.1 on last check  -If continues to worsen will Pan-Culture and obtain Cx from Tracheal Aspirate  -Continue to Monitor and Trend intermittently    Constipation -Stopped Sennosides and started Senna-Docusate 1 tab po BID and Miralax 17 g po BID -C/w Bisacodyl 10 mg RC Dailyprn Moderate Constipation  Severe Protein Calorie Malnutrition -Nutritionist consulted for further evaluation recommendations -Nutritional services recommending continuing tube feedings via PEG with Osmolite 1.5 at 55 MLS per hour and recommending Prosource tube feedings at 45 mL twice daily and 150 mL free water flushes every 4 hours -Had issues with his TF Pump this AM but is now fixed  DVT prophylaxis: SCDs Code Status: DO NOT RESUSCITATE Family Communication: No family present at bedside Disposition Plan: Original plan was for the patient to go to SNF for now PT OT recommended recommendation for home health; may be able to send him home pending on his clinical improvement but he is too weak to go home so we will still go to SNF as he does not have a caregiver at home  Status is: Inpatient  Remains inpatient appropriate because:Unsafe d/c plan, IV treatments appropriate due to intensity of illness or inability to take PO, and Inpatient level of care appropriate due to severity of illness  Dispo: The patient is from: Home              Anticipated d/c is to: SNF              Patient currently is not medically stable to d/c.   Difficult to place patient No  Consultants:  ENT Trauma surgery for PEG Palliaitve  PCCM  Procedures:  Laryngoscopy / trach  ECHOCARDIOGRAM IMPRESSIONS   1. Technically difficult study, very poor visualization of left ventricle even after contrast administration. Left ventricular ejection fraction, by estimation, is 55 to 60%. The left ventricle has grossly normal function but very poorly visualized. Left ventricular endocardial border not optimally defined to evaluate regional wall motion. Left ventricular diastolic parameters are indeterminate.   2. Right ventricular systolic function is normal. The right ventricular size is  mildly enlarged. Tricuspid regurgitation signal is inadequate for assessing PA pressure.   3. Right atrial size was moderately dilated.   4. A small pericardial effusion is present. The pericardial effusion is anterior to the right ventricle. Presence of pericardial fat pad.   5. The mitral valve is grossly normal. No evidence of mitral valve regurgitation.   6. The aortic valve was not well visualized. Aortic valve regurgitation is not visualized. No aortic stenosis is present.   7. The inferior vena cava is normal in size with greater than 50% respiratory variability, suggesting right atrial pressure of 3 mmHg.   Antimicrobials:  Anti-infectives (From admission, onward)    Start     Dose/Rate Route Frequency Ordered Stop   07/12/21 2200  ceFEPIme (MAXIPIME) 2 g in sodium chloride 0.9 % 100 mL IVPB        2 g 200 mL/hr over 30 Minutes Intravenous Every 12 hours 07/12/21 1555 07/14/21 2359   07/10/21 2200  ceFEPIme (  MAXIPIME) 2 g in sodium chloride 0.9 % 100 mL IVPB  Status:  Discontinued        2 g 200 mL/hr over 30 Minutes Intravenous Every 8 hours 07/10/21 1316 07/12/21 1555   07/08/21 0800  vancomycin (VANCOCIN) IVPB 1000 mg/200 mL premix  Status:  Discontinued        1,000 mg 200 mL/hr over 60 Minutes Intravenous Every 24 hours 07/07/21 0747 07/09/21 0855   07/07/21 0900  vancomycin (VANCOREADY) IVPB 1250 mg/250 mL        1,250 mg 166.7 mL/hr over 90 Minutes Intravenous  Once 07/07/21 0747 07/07/21 1412   07/07/21 0900  ceFEPIme (MAXIPIME) 2 g in sodium chloride 0.9 % 100 mL IVPB  Status:  Discontinued        2 g 200 mL/hr over 30 Minutes Intravenous Every 12 hours 07/07/21 0747 07/10/21 1316   07/07/21 0800  cefTRIAXone (ROCEPHIN) 1 g in sodium chloride 0.9 % 100 mL IVPB  Status:  Discontinued        1 g 200 mL/hr over 30 Minutes Intravenous Every 24 hours 07/07/21 0707 07/07/21 0709   07/06/21 1430  fluconazole (DIFLUCAN) IVPB 100 mg  Status:  Discontinued        100 mg 50 mL/hr  over 60 Minutes Intravenous Every 24 hours 07/06/21 1341 07/06/21 1404   07/02/21 2100  cefTRIAXone (ROCEPHIN) 1 g in sodium chloride 0.9 % 100 mL IVPB        1 g 200 mL/hr over 30 Minutes Intravenous Every 24 hours 07/02/21 0114 07/06/21 2325   07/01/21 2145  cefTRIAXone (ROCEPHIN) 1 g in sodium chloride 0.9 % 100 mL IVPB        1 g 200 mL/hr over 30 Minutes Intravenous  Once 07/01/21 2141 07/01/21 2235   07/01/21 2145  azithromycin (ZITHROMAX) 500 mg in sodium chloride 0.9 % 250 mL IVPB        500 mg 250 mL/hr over 60 Minutes Intravenous  Once 07/01/21 2141 07/02/21 0000        Subjective: Seen and examined at bedside and he is in no acute distress.  Feels about the same.  Has not had a bowel movement in several days.  Denies any chest pain or shortness of breath.  No lightheadedness or dizziness.  No other concerns or complaints this time.  Objective: Vitals:   08/05/21 0815 08/05/21 0828 08/05/21 1155 08/05/21 1224  BP: 125/64  (!) 121/58   Pulse: 74 85 79 76  Resp: (!) 24 19 15 (!) 21  Temp: 98.1 F (36.7 C)  98.6 F (37 C)   TempSrc: Oral  Oral   SpO2: 96% 100% 100% 97%  Weight:      Height:        Intake/Output Summary (Last 24 hours) at 08/05/2021 1451 Last data filed at 08/05/2021 1428 Gross per 24 hour  Intake 1015.66 ml  Output 1000 ml  Net 15.66 ml    Filed Weights   08/02/21 0838 08/04/21 0500 08/05/21 0344  Weight: 72.2 kg 67.9 kg 70 kg   Examination: Physical Exam:  Constitutional: Patient is a thin elderly chronically ill-appearing Caucasian male currently no acute distress  Eyes: Lids and conjunctivae normal, sclerae anicteric  ENMT: External Ears, Nose appear normal.  Slightly hard of hearing Neck: Has a tracheostomy in place not connected oxygen currently Respiratory: Diminished to auscultation bilaterally with coarse breath sounds, no wheezing, rales, rhonchi or crackles. Normal respiratory effort and patient is not tachypenic. No   accessory muscle  use.  Unlabored breathing Cardiovascular: RRR, no murmurs / rubs / gallops. S1 and S2 auscultated. No carotid bruits.  Abdomen: Soft, non-tender, non-distended. Bowel sounds positive.  GU: Deferred. Musculoskeletal: No clubbing / cyanosis of digits/nails. No joint deformity upper and lower extremities. Skin: No rashes, lesions, ulcers. No induration; Warm and dry.  Neurologic: CN 2-12 grossly intact with no focal deficits. Romberg sign and cerebellar reflexes not assessed.  Psychiatric: Normal judgment and insight. Alert and oriented x 3. Normal mood and appropriate affect.   Data Reviewed: I have personally reviewed following labs and imaging studies  CBC: Recent Labs  Lab 07/31/21 0347 08/01/21 0103 08/03/21 0350 08/04/21 0222  WBC 7.8 8.9 11.9* 10.1  NEUTROABS 5.4 6.7 9.9* 7.8*  HGB 10.3* 9.9* 10.1* 9.8*  HCT 31.8* 31.9* 32.4* 31.7*  MCV 93.8 95.2 95.9 96.4  PLT 194 187 181 742    Basic Metabolic Panel: Recent Labs  Lab 07/31/21 0347 08/01/21 0103 08/03/21 0350 08/04/21 0222  NA 136 136 139 137  K 4.2 4.2 4.3 4.1  CL 102 102 102 101  CO2 _0 GLUCOSE 156* 130* 173* 124*  BUN 40* 33* 35* 32*  CREATININE 0.84 0.73 0.81 0.80  CALCIUM 9.0 9.2 9.1 9.0  MG 2.2 2.2 2.2 2.3  PHOS 3.8 3.7 3.2 3.6    GFR: Estimated Creatinine Clearance: 57.6 mL/min (by C-G formula based on SCr of 0.8 mg/dL). Liver Function Tests: Recent Labs  Lab 07/31/21 0347 08/01/21 0103 08/03/21 0350 08/04/21 0222  AST _1 14*  ALT _2 ALKPHOS 67 67 79 72  BILITOT 0.6 0.6 0.4 0.5  PROT 6.4* 6.5 6.5 6.2*  ALBUMIN 2.5* 2.6* 2.6* 2.4*    No results for input(s): LIPASE, AMYLASE in the last 168 hours. No results for input(s): AMMONIA in the last 168 hours. Coagulation Profile: No results for input(s): INR, PROTIME in the last 168 hours. Cardiac Enzymes: No results for input(s): CKTOTAL, CKMB, CKMBINDEX, TROPONINI in the last 168 hours. BNP (last 3 results) Recent  Labs    11/08/20 1106  PROBNP 482.0*    HbA1C: No results for input(s): HGBA1C in the last 72 hours. CBG: Recent Labs  Lab 08/04/21 1930 08/05/21 0003 08/05/21 0341 08/05/21 0812 08/05/21 1153  GLUCAP 117* 122* 142* 120* 147*    Lipid Profile: No results for input(s): CHOL, HDL, LDLCALC, TRIG, CHOLHDL, LDLDIRECT in the last 72 hours. Thyroid Function Tests: No results for input(s): TSH, T4TOTAL, FREET4, T3FREE, THYROIDAB in the last 72 hours. Anemia Panel: No results for input(s): VITAMINB12, FOLATE, FERRITIN, TIBC, IRON, RETICCTPCT in the last 72 hours. Sepsis Labs: No results for input(s): PROCALCITON, LATICACIDVEN in the last 168 hours.  No results found for this or any previous visit (from the past 240 hour(s)).   RN Pressure Injury Documentation:    Estimated body mass index is 24.91 kg/m as calculated from the following:   Height as of this encounter: 5' 6" (1.676 m).   Weight as of this encounter: 70 kg.  Malnutrition Type:  Nutrition Problem: Severe Malnutrition Etiology: chronic illness (COPD)  Malnutrition Characteristics:  Signs/Symptoms: severe fat depletion, severe muscle depletion  Nutrition Interventions:  Interventions: Refer to RD note for recommendations   Radiology Studies: No results found.  Scheduled Meds:  acetaminophen  500 mg Per Tube TID   allopurinol  100 mg Oral Daily   amLODipine  5 mg Oral Daily   arformoterol  15 mcg  Nebulization BID   budesonide (PULMICORT) nebulizer solution  0.25 mg Nebulization BID   chlorhexidine gluconate (MEDLINE KIT)  15 mL Mouth Rinse BID   Chlorhexidine Gluconate Cloth  6 each Topical Daily   feeding supplement (PROSource TF)  45 mL Per Tube BID   free water  150 mL Per Tube Q4H   guaiFENesin  10 mL Oral Q8H   insulin aspart  0-9 Units Subcutaneous Q4H   lidocaine  1 application Urethral Once   mouth rinse  15 mL Mouth Rinse 10 times per day   nystatin  5 mL Oral QID   polyethylene glycol  17  g Per Tube BID   revefenacin  175 mcg Nebulization Daily   scopolamine  1 patch Transdermal Q72H   senna-docusate  1 tablet Per Tube BID   sodium chloride flush  10-40 mL Intracatheter Q12H   Continuous Infusions:  feeding supplement (OSMOLITE 1.5 CAL) 1,000 mL (08/05/21 0941)    LOS: 35 days    Latif , DO [facility] PAGER is on AMION  If 7PM-7AM, please contact night-coverage [email or web address]   

## 2021-08-06 DIAGNOSIS — J9601 Acute respiratory failure with hypoxia: Secondary | ICD-10-CM | POA: Diagnosis not present

## 2021-08-06 LAB — CBC WITH DIFFERENTIAL/PLATELET
Abs Immature Granulocytes: 0.04 10*3/uL (ref 0.00–0.07)
Basophils Absolute: 0 10*3/uL (ref 0.0–0.1)
Basophils Relative: 0 %
Eosinophils Absolute: 0.3 10*3/uL (ref 0.0–0.5)
Eosinophils Relative: 3 %
HCT: 33.8 % — ABNORMAL LOW (ref 39.0–52.0)
Hemoglobin: 10.6 g/dL — ABNORMAL LOW (ref 13.0–17.0)
Immature Granulocytes: 0 %
Lymphocytes Relative: 11 %
Lymphs Abs: 1 10*3/uL (ref 0.7–4.0)
MCH: 29.9 pg (ref 26.0–34.0)
MCHC: 31.4 g/dL (ref 30.0–36.0)
MCV: 95.2 fL (ref 80.0–100.0)
Monocytes Absolute: 0.8 10*3/uL (ref 0.1–1.0)
Monocytes Relative: 8 %
Neutro Abs: 7.4 10*3/uL (ref 1.7–7.7)
Neutrophils Relative %: 78 %
Platelets: 182 10*3/uL (ref 150–400)
RBC: 3.55 MIL/uL — ABNORMAL LOW (ref 4.22–5.81)
RDW: 13.5 % (ref 11.5–15.5)
WBC: 9.6 10*3/uL (ref 4.0–10.5)
nRBC: 0 % (ref 0.0–0.2)

## 2021-08-06 LAB — COMPREHENSIVE METABOLIC PANEL
ALT: 12 U/L (ref 0–44)
AST: 17 U/L (ref 15–41)
Albumin: 2.7 g/dL — ABNORMAL LOW (ref 3.5–5.0)
Alkaline Phosphatase: 76 U/L (ref 38–126)
Anion gap: 11 (ref 5–15)
BUN: 37 mg/dL — ABNORMAL HIGH (ref 8–23)
CO2: 28 mmol/L (ref 22–32)
Calcium: 9.4 mg/dL (ref 8.9–10.3)
Chloride: 99 mmol/L (ref 98–111)
Creatinine, Ser: 0.8 mg/dL (ref 0.61–1.24)
GFR, Estimated: >60 mL/min (ref >60)
Glucose, Bld: 152 mg/dL — ABNORMAL HIGH (ref 70–99)
Potassium: 4.3 mmol/L (ref 3.5–5.1)
Sodium: 138 mmol/L (ref 135–145)
Total Bilirubin: 0.8 mg/dL (ref 0.3–1.2)
Total Protein: 6.8 g/dL (ref 6.5–8.1)

## 2021-08-06 LAB — GLUCOSE, CAPILLARY
Glucose-Capillary: 107 mg/dL — ABNORMAL HIGH (ref 70–99)
Glucose-Capillary: 115 mg/dL — ABNORMAL HIGH (ref 70–99)
Glucose-Capillary: 119 mg/dL — ABNORMAL HIGH (ref 70–99)
Glucose-Capillary: 132 mg/dL — ABNORMAL HIGH (ref 70–99)
Glucose-Capillary: 133 mg/dL — ABNORMAL HIGH (ref 70–99)
Glucose-Capillary: 151 mg/dL — ABNORMAL HIGH (ref 70–99)

## 2021-08-06 LAB — MAGNESIUM: Magnesium: 2.2 mg/dL (ref 1.7–2.4)

## 2021-08-06 LAB — PHOSPHORUS: Phosphorus: 3.5 mg/dL (ref 2.5–4.6)

## 2021-08-06 NOTE — Progress Notes (Signed)
Occupational Therapy Treatment Patient Details Name: Christopher Santiago MRN: [id number] DOB: May 10, 1931 Today's Date: 08/06/2021   History of present illness 87 y/o male admitted 8/9 secondary to worsening SOB, especially with exertion. Found to have acute respiratory failure with hypoxia. Imaging showed fullness of the right aryepiglottic fold extending into the right pyriform and L renal mass. Pt found to have left renal mass 8/9. Pt had Flexible laryngoscopy on 8/10 with trach. PEG 8/23. Acute course complicated by metabolic encephalopathy. PMHx:vocal cord cancer, COPD, CAD, DM, HTN, and R THA, gout, PVD, BPH.   OT comments  OT treatment session with focus on activity tolerance, strengthening and balance. Patient participated in [place] with orange level 2 theraband. Please refer below for further details. BP soft throughout. Extra time with change in position encouraged. Patient making steady progress toward goals and would benefit from continued acute OT services in prep for safe d/c to next level of care. D/c disposition updated to SNF rehab.     Recommendations for follow up therapy are one component of a multi-disciplinary discharge planning process, led by the attending physician.  Recommendations may be updated based on patient status, additional functional criteria and insurance authorization.    Follow Up Recommendations  SNF (CIR denied)    Equipment Recommendations  Other (comment)    Recommendations for Other Services Rehab consult    Precautions / Restrictions Precautions Precautions: Fall Precaution Comments: trach, peg, monitor BP Restrictions Weight Bearing Restrictions: No       Mobility Bed Mobility Overal bed mobility: Needs Assistance Bed Mobility: Supine to Sit Rolling: Supervision Sidelying to sit: HOB elevated;Supervision       General bed mobility comments: Supervison A for rolling and from sidelying to sitting at EOB with assist for line management  only.    Transfers Overall transfer level: Needs assistance Equipment used: Rolling walker (2 wheeled) Transfers: Sit to/from [employer] Sit to Stand: Supervision Stand pivot transfers: Min guard       General transfer comment: Supervision A for sit to stand from EOB. Good recall of hand placement. Min guard for stand-pivot to recliner.    Balance Overall balance assessment: Needs assistance Sitting-balance support: Feet supported;No upper extremity supported Sitting balance-Leahy Scale: Good Sitting balance - Comments: Patient up in recliner   Standing balance support: During functional activity;Bilateral upper extremity supported Standing balance-Leahy Scale: Fair Standing balance comment: Reliant on at least unilateral UE support with static standing and BUE support with use of RW for dynamic balance.                           ADL either performed or assessed with clinical judgement   ADL Overall ADL's : Needs assistance/impaired                                             Vision       Perception     Praxis      Cognition Arousal/Alertness: Awake/alert Behavior During Therapy: WFL for tasks assessed/performed Overall Cognitive Status: Within Functional Limits for tasks assessed                                          Exercises Exercises: General Lower Extremity;General  Upper Extremity General Exercises - Upper Extremity Shoulder Flexion: Strengthening;10 reps;Theraband Theraband Level (Shoulder Flexion): Other (comment) (Level 2 orange) Shoulder Extension: AROM;Strengthening;10 reps;Both;Theraband Theraband Level (Shoulder Extension): Other (comment) (Level 2 orange) Shoulder ABduction: Strengthening;10 reps;Both;Theraband Theraband Level (Shoulder Abduction): Other (comment) (Level 2 orange) Elbow Flexion: Strengthening;10 reps;Seated;Theraband;Both Theraband Level (Elbow Flexion): Other  (comment) (Level 2 orange) Elbow Extension: Strengthening;10 reps;Seated;Theraband;Both Theraband Level (Elbow Extension): Other (comment) (Level 2 orange) General Exercises - Lower Extremity Straight Leg Raises: Strengthening;Both;10 reps;Seated Hip Flexion/Marching: Strengthening;Both;10 reps;Seated Toe Raises: Strengthening;Both;10 reps;Seated Heel Raises: Strengthening;Both;10 reps;Seated   Shoulder Instructions       General Comments Soft BP. Extra caution with change in position. Supine BP 122/46, 117/59 seated EOB and 122/54 seated in recliner. Mild dizziness upon sitting EOB otherwise no c/o dizziness/light headedness. SpO2 98-100% on 6L via trach collar, FiO2 28%. HR 70-80's throughout.    Pertinent Vitals/ Pain       Pain Assessment: No/denies pain Pain Intervention(s): Monitored during session  Home Living                                          Prior Functioning/Environment              Frequency  Min 2X/week        Progress Toward Goals  OT Goals(current goals can now be found in the care plan section)  Progress towards OT goals: Progressing toward goals  Acute Rehab OT Goals Patient Stated Goal: to go to SNF OT Goal Formulation: With patient Time For Goal Achievement: 07/31/21 Potential to Achieve Goals: Good ADL Goals Pt Will Perform Eating: Independently Pt Will Perform Grooming: with modified independence;standing Pt Will Perform Upper Body Dressing: Independently Pt Will Perform Lower Body Dressing: with modified independence;sit to/from stand Pt Will Transfer to Toilet: with modified independence;ambulating Pt Will Perform Toileting - Clothing Manipulation and hygiene: with modified independence;sit to/from stand Pt Will Perform Tub/Shower Transfer: Shower transfer;Tub transfer;rolling walker;shower seat Pt/caregiver will Perform Home Exercise Program: Increased ROM;Increased strength;Both right and left upper extremity  Plan  Frequency remains appropriate;Discharge plan needs to be updated    Co-evaluation                 AM-PAC OT "6 Clicks" Daily Activity     Outcome Measure   Help from another person eating meals?: Total Help from another person taking care of personal grooming?: A Little Help from another person toileting, which includes using toliet, bedpan, or urinal?: A Little Help from another person bathing (including washing, rinsing, drying)?: A Little Help from another person to put on and taking off regular upper body clothing?: A Little Help from another person to put on and taking off regular lower body clothing?: A Little 6 Click Score: 16    End of Session Equipment Utilized During Treatment: Oxygen;Rolling walker  OT Visit Diagnosis: Unsteadiness on feet (R26.81);Muscle weakness (generalized) (M62.81)   Activity Tolerance Patient tolerated treatment well   Patient Left in chair;with call bell/phone within reach;with chair alarm set   Nurse Communication Mobility status        Time: 1749-4496 OT Time Calculation (min): 27 min  Charges: OT General Charges $OT Visit: 1 Visit OT Treatments $Therapeutic Exercise: 23-37 mins  Delyla Sandeen H. OTR/L Supplemental OT, Department of rehab services [phone]  Pink Maye R H. 08/06/2021, 9:35 AM

## 2021-08-06 NOTE — TOC Progression Note (Addendum)
Transition of Care [facility]) - Progression Note    Patient Details  Name: JUEL BELLEROSE MRN: [id number] Date of Birth: Apr 18, 1931  Transition of Care [facility]) CM/SW Contact  [name], LCSW Phone Number: 08/06/2021, 8:22 AM  Clinical Narrative:    TC message from [facility]: [facility] cannot accept pt. CSW spoke with Kenney Houseman at [facility] accept trach pt currently.  CSW spoke with Santiago Glad at [facility] in [place]  They do not have any trach beds available currently.   1110: Janie at [facility] declines referral.      Expected Discharge Plan: [place] Barriers to Discharge: Continued Medical Work up, SNF Pending bed offer, Other (must enter comment) (pt has new trach placed on 07/02/21)  Expected Discharge Plan and Services Expected Discharge Plan: [place] Choice: [place] arrangements for the past 2 months: Single Family Home                                       Social Determinants of Health (SDOH) Interventions    Readmission Risk Interventions No flowsheet data found.

## 2021-08-06 NOTE — Plan of Care (Signed)
  Problem: Education: Goal: Knowledge of General Education information will improve Description: Including pain rating scale, medication(s)/side effects and non-pharmacologic comfort measures 08/06/2021 0524 by Loma Messing, RN Outcome: Not Progressing 08/06/2021 0524 by Loma Messing, RN Outcome: Not Progressing   Problem: Health Behavior/Discharge Planning: Goal: Ability to manage health-related needs will improve 08/06/2021 0524 by Loma Messing, RN Outcome: Not Progressing 08/06/2021 0524 by Karsten Fells D, RN Outcome: Not Progressing   Problem: Clinical Measurements: Goal: Ability to maintain clinical measurements within normal limits will improve 08/06/2021 0524 by Loma Messing, RN Outcome: Not Progressing 08/06/2021 0524 by Karsten Fells D, RN Outcome: Not Progressing Goal: Will remain free from infection 08/06/2021 0524 by Loma Messing, RN Outcome: Not Progressing 08/06/2021 0524 by Loma Messing, RN Outcome: Not Progressing Goal: Diagnostic test results will improve 08/06/2021 0524 by Loma Messing, RN Outcome: Not Progressing 08/06/2021 0524 by Karsten Fells D, RN Outcome: Not Progressing Goal: Respiratory complications will improve 08/06/2021 0524 by Loma Messing, RN Outcome: Not Progressing 08/06/2021 0524 by Karsten Fells D, RN Outcome: Not Progressing Goal: Cardiovascular complication will be avoided 08/06/2021 0524 by Loma Messing, RN Outcome: Not Progressing 08/06/2021 0524 by Loma Messing, RN Outcome: Not Progressing   Problem: Activity: Goal: Risk for activity intolerance will decrease 08/06/2021 0524 by Loma Messing, RN Outcome: Not Progressing 08/06/2021 0524 by Karsten Fells D, RN Outcome: Not Progressing   Problem: Coping: Goal: Level of anxiety will decrease 08/06/2021 0524 by Loma Messing, RN Outcome: Not Progressing 08/06/2021 0524 by Karsten Fells D, RN Outcome: Not  Progressing   Problem: Elimination: Goal: Will not experience complications related to bowel motility 08/06/2021 0524 by Loma Messing, RN Outcome: Not Progressing 08/06/2021 0524 by Karsten Fells D, RN Outcome: Not Progressing Goal: Will not experience complications related to urinary retention 08/06/2021 0524 by Loma Messing, RN Outcome: Not Progressing 08/06/2021 0524 by Loma Messing, RN Outcome: Not Progressing   Problem: Pain Managment: Goal: General experience of comfort will improve 08/06/2021 0524 by Loma Messing, RN Outcome: Not Progressing 08/06/2021 0524 by Loma Messing, RN Outcome: Not Progressing   Problem: Safety: Goal: Ability to remain free from injury will improve 08/06/2021 0524 by Loma Messing, RN Outcome: Not Progressing 08/06/2021 0524 by Loma Messing, RN Outcome: Not Progressing

## 2021-08-06 NOTE — Progress Notes (Signed)
PROGRESS NOTE    Christopher Santiago  HQI:[id number] DOB: 03/24/1931 DOA: 07/01/2021 PCP: Tonia Ghent, MD    Brief Narrative:  Christopher Santiago is an 87 year old male with past medical history significant for COPD, type 2 diabetes mellitus, essential hypertension, laryngeal cancer who presented to [facility] ED on 8/9 with exertional dyspnea.  Patient also complaining of postnasal drip, hoarseness for the past 3-4 months.  Initially admitted for acute respiratory failure secondary to COPD exacerbation.  Also had been complaining of difficulty swallowing with CT maxillofacial/soft tissue neck with asymmetric soft tissue density/fullness of the base of the right aryepiglottic fold extending to the right piriform.  ENT was consulted and patient underwent direct visualization and lower endoscopy with biopsy and tracheostomy on 8/10.   Assessment & Plan:   Principal Problem:   Acute respiratory failure with hypoxia (HCC) Active Problems:   Essential hypertension   COPD with acute exacerbation (HCC)   Left renal mass   Respiratory failure, acute (HCC)   Protein-calorie malnutrition, severe   Tracheostomy status (HCC)   Tracheobronchitis   Dementia ([place])   Acute hypoxic respiratory failure COPD exacerbation Etiology likely multifactorial with component of aspiration due to dysphagia, laryngeal cancer that is recurrent and COPD exacerbation.  Patient completed 5-day course of IV steroids, 7-day course of antibiotics with cefepime with last day 8/21. --Continue trach collar, 5 L --Brovana nebs twice daily --Budesonide neb twice daily --Albuterol neb as needed  Squamous cell carcinoma larynx CT soft tissue neck with asymmetric soft tissue density fullness at the base of the right aryepiglottic fold. ENT was consulted. Patient underwent laryngoscopy with biopsy and tracheostomy 8/11, by Dr. Fredric Dine. --Trach changed to [place] 6 Prox cuffless --Speech therapy following --PEG tube placed by General  Surgery, Dr. Bobbye Morton 8/23; on tube feeds; out patient f/u w/ [facility] --Scopolamine patch every 72 hours secretions --Outpatient follow-up with ENT, medical/radiation oncology  Left renal mass 3.0 x 2.5 cm right adrenal mass likely secondary to adenoma.   --Outpatient follow-up with urology  Right upper lobe nodule Mildly spiculated right upper lobe nodule 0.9 x 0.6 cm extending to the pleural surface.  Outpatient follow-up with oncology.  Bradycardia/sinus pause During hospitalization, patient was noted to have a 3.5-second pause.  Cardiology was consulted with no recommendations/intervention given patient asymptomatic.  Essential hypertension BP 101/56, well controlled. --Amlodipine 5 mg p.o. daily  Type 2 diabetes mellitus Hemoglobin A1c 5.8, well controlled. --SSI for coverage --CBGs q4h  Severe protein calorie malnutrition: Body mass index is 24.91 kg/m. Nutrition Status: Nutrition Problem: Severe Malnutrition Etiology: chronic illness (COPD) Signs/Symptoms: severe fat depletion, severe muscle depletion Interventions: Refer to RD note for recommendations --Dietitian following, appreciate assistance --G-tube placed by IR 8/17 --Continue tube feeds  Weakness/debility/deconditioning: PT/OT recommends CIR initially, which was declined by his insurance company.  Difficult placement given his tracheostomy. --Continue PT/OT efforts while inpatient --If no SNF is available, may need to discharge home with likely plan of outpatient hospice versus palliative care to follow after discharge.   DVT prophylaxis: Place and maintain sequential compression device Start: 07/04/21 1027   Code Status: DNR Family Communication: No family present at bedside this morning  Disposition Plan:  Level of care: Progressive Status is: Inpatient  Remains inpatient appropriate because:Unsafe d/c plan and Inpatient level of care appropriate due to severity of illness  Dispo: The patient is from:  Home              Anticipated d/c is to: SNF  Patient currently is not medically stable to d/c.   Difficult to place patient No   Consultants:  Palliative care Radiation oncology ENT, Dr. Fredric Dine PCCM Interventional radiology General Surgery for PEG tube placement  Procedures:  MICROLARYNGOSCOPY WITH BIOPSY 8/10; Dr Fredric Dine TRACHEOSTOMY 8/10, Dr. Fredric Dine PEG tube placement, General Surgery; Dr. Bobbye Morton 8/23  Antimicrobials:  Cefepime 8/15 - 8/22 Azithromycin 8/9 - 8/10 Ceftriaxone 8/9 - 8/9, 8/15 - 8/15 Vancomycin 8/15 - 8/17 Fluconazole 8/14 - 8/14   Subjective: Patient seen examined bedside, resting comfortably.  Sitting in bedside chair.  No specific complaints this morning.  Denies headache, no dizziness, no chest pain, no palpitations, no shortness of breath more than his typical baseline, no abdominal pain, no weakness, no fatigue, no paresthesias.  No acute events overnight per nursing staff.  Objective: Vitals:   08/06/21 0759 08/06/21 1134 08/06/21 1244 08/06/21 1512  BP: (!) 137/55  126/82 126/82  Pulse: 74 77  72  Resp: _0 Temp:   98.1 F (36.7 C)   TempSrc:   Oral   SpO2:  100%  98%  Weight:      Height:        Intake/Output Summary (Last 24 hours) at 08/06/2021 1553 Last data filed at 08/06/2021 0843 Gross per 24 hour  Intake 270 ml  Output 900 ml  Net -630 ml   Filed Weights   08/02/21 0838 08/04/21 0500 08/05/21 0344  Weight: 72.2 kg 67.9 kg 70 kg    Examination:  General exam: Appears calm and comfortable, chronically ill, cachectic in appearance Respiratory system: Decreased breath sounds bilateral bases, normal respiratory effort, no accessory muscle use, on 5 L trach collar, tracheostomy noted Cardiovascular system: S1 & S2 heard, RRR. No JVD, murmurs, rubs, gallops or clicks. No pedal edema. Gastrointestinal system: Abdomen is nondistended, soft and nontender. No organomegaly or masses felt. Normal bowel sounds  heard. Central nervous system: Alert and oriented. No focal neurological deficits. Extremities: Symmetric 5 x 5 power. Skin: No rashes, lesions or ulcers Psychiatry: Judgement and insight appear normal. Mood & affect appropriate.     Data Reviewed: I have personally reviewed following labs and imaging studies  CBC: Recent Labs  Lab 07/31/21 0347 08/01/21 0103 08/03/21 0350 08/04/21 0222 08/06/21 0209  WBC 7.8 8.9 11.9* 10.1 9.6  NEUTROABS 5.4 6.7 9.9* 7.8* 7.4  HGB 10.3* 9.9* 10.1* 9.8* 10.6*  HCT 31.8* 31.9* 32.4* 31.7* 33.8*  MCV 93.8 95.2 95.9 96.4 95.2  PLT 194 187 181 179 644   Basic Metabolic Panel: Recent Labs  Lab 07/31/21 0347 08/01/21 0103 08/03/21 0350 08/04/21 0222 08/06/21 0209  NA 136 136 139 137 138  K 4.2 4.2 4.3 4.1 4.3  CL 102 102 102 101 99  CO2 _1 GLUCOSE 156* 130* 173* 124* 152*  BUN 40* 33* 35* 32* 37*  CREATININE 0.84 0.73 0.81 0.80 0.80  CALCIUM 9.0 9.2 9.1 9.0 9.4  MG 2.2 2.2 2.2 2.3 2.2  PHOS 3.8 3.7 3.2 3.6 3.5   GFR: Estimated Creatinine Clearance: 57.6 mL/min (by C-G formula based on SCr of 0.8 mg/dL). Liver Function Tests: Recent Labs  Lab 07/31/21 0347 08/01/21 0103 08/03/21 0350 08/04/21 0222 08/06/21 0209  AST _2 14* 17  ALT _3 ALKPHOS 67 67 79 72 76  BILITOT 0.6 0.6 0.4 0.5 0.8  PROT 6.4* 6.5 6.5 6.2* 6.8  ALBUMIN 2.5* 2.6* 2.6* 2.4* 2.7*  No results for input(s): LIPASE, AMYLASE in the last 168 hours. No results for input(s): AMMONIA in the last 168 hours. Coagulation Profile: No results for input(s): INR, PROTIME in the last 168 hours. Cardiac Enzymes: No results for input(s): CKTOTAL, CKMB, CKMBINDEX, TROPONINI in the last 168 hours. BNP (last 3 results) Recent Labs    11/08/20 1106  PROBNP 482.0*   HbA1C: No results for input(s): HGBA1C in the last 72 hours. CBG: Recent Labs  Lab 08/05/21 1935 08/05/21 2342 08/06/21 0413 08/06/21 0805 08/06/21 1243  GLUCAP 117*  123* 151* 132* 119*   Lipid Profile: No results for input(s): CHOL, HDL, LDLCALC, TRIG, CHOLHDL, LDLDIRECT in the last 72 hours. Thyroid Function Tests: No results for input(s): TSH, T4TOTAL, FREET4, T3FREE, THYROIDAB in the last 72 hours. Anemia Panel: No results for input(s): VITAMINB12, FOLATE, FERRITIN, TIBC, IRON, RETICCTPCT in the last 72 hours. Sepsis Labs: No results for input(s): PROCALCITON, LATICACIDVEN in the last 168 hours.  No results found for this or any previous visit (from the past 240 hour(s)).       Radiology Studies: No results found.      Scheduled Meds:  acetaminophen  500 mg Per Tube TID   allopurinol  100 mg Oral Daily   amLODipine  5 mg Oral Daily   arformoterol  15 mcg Nebulization BID   budesonide (PULMICORT) nebulizer solution  0.25 mg Nebulization BID   chlorhexidine gluconate (MEDLINE KIT)  15 mL Mouth Rinse BID   Chlorhexidine Gluconate Cloth  6 each Topical Daily   feeding supplement (PROSource TF)  45 mL Per Tube BID   free water  150 mL Per Tube Q4H   guaiFENesin  10 mL Oral Q8H   insulin aspart  0-9 Units Subcutaneous Q4H   lidocaine  1 application Urethral Once   mouth rinse  15 mL Mouth Rinse 10 times per day   nystatin  5 mL Oral QID   polyethylene glycol  17 g Per Tube BID   revefenacin  175 mcg Nebulization Daily   scopolamine  1 patch Transdermal Q72H   senna-docusate  1 tablet Per Tube BID   sodium chloride flush  10-40 mL Intracatheter Q12H   Continuous Infusions:  feeding supplement (OSMOLITE 1.5 CAL) 1,000 mL (08/06/21 0454)     LOS: 36 days    Time spent: 39 minutes spent on chart review, discussion with nursing staff, consultants, updating family and interview/physical exam; more than 50% of that time was spent in counseling and/or coordination of care.    Christopher Marschner J [place] Christopher Santiago [facility] Available via Epic secure chat 7am-7pm After these hours, please refer to coverage provider listed on  [email or web address] 08/06/2021, 3:53 PM

## 2021-08-06 NOTE — Progress Notes (Signed)
Physical Therapy Treatment Patient Details Name: Christopher Santiago MRN: [id number] DOB: March 13, 1931 Today's Date: 08/06/2021   History of Present Illness 87 y/o male admitted 8/9 secondary to worsening SOB, especially with exertion. Found to have acute respiratory failure with hypoxia. Imaging showed fullness of the right aryepiglottic fold extending into the right pyriform and L renal mass. Pt found to have left renal mass 8/9. Pt had Flexible laryngoscopy on 8/10 with trach. PEG 8/23. Acute course complicated by metabolic encephalopathy. PMHx:vocal cord cancer, COPD, CAD, DM, HTN, and R THA, gout, PVD, BPH.    PT Comments    Pt is steadily improving.  SNF placement is proving to be difficult.  Will try working toward home with daughters intermittent to PRN assist in case placement continues to be difficult.  Emphasis on transitions with flat bed, scooting, sit to stands from lower surfaces, progression of gait stability/stamina and general strengthening with standing exercise.    Recommendations for follow up therapy are one component of a multi-disciplinary discharge planning process, led by the attending physician.  Recommendations may be updated based on patient status, additional functional criteria and insurance authorization.  Follow Up Recommendations  SNF;Supervision/Assistance - 24 hour;Other (comment) (SW is having difficulty with SNF placement with trach. Will work toward home with dtr's intermittent assist in case this can be worked out satisfactorily.)     Equipment Recommendations  Other (comment);Rolling walker with 5" wheels (TBA)    Recommendations for Other Services       Precautions / Restrictions Precautions Precautions: Fall Precaution Comments: trach, peg, monitor BP     Mobility  Bed Mobility Overal bed mobility: Needs Assistance Bed Mobility: Supine to Sit Rolling: Supervision   Supine to sit: Min assist     General bed mobility comments: cues for  sequence, light minimal assist with bed flat, minor use of the rail, to get from l elbow to up on L hand to push up.    Transfers Overall transfer level: Needs assistance Equipment used: Rolling walker (2 wheeled) Transfers: Sit to/from Stand Sit to Stand: Supervision Stand pivot transfers: Min guard       General transfer comment: cues on occasion to use UE's for safety both ascending/descending.  Ambulation/Gait Ambulation/Gait assistance: Min guard Gait Distance (Feet): 240 Feet (total;  120 x2 with standing rest/standing exercise at mid point.) Assistive device: Rolling walker (2 wheeled) Gait Pattern/deviations: Step-through pattern Gait velocity: decreased Gait velocity interpretation: <1.8 ft/sec, indicate of risk for recurrent falls General Gait Details: generally steady, improved posture and proximity in the RW.  Pt able to scan environment without overt deviation.  Sats on RA variable bw 90 and 95% on RA with PMV.  Breathing gets more "ragged" as he fatigues a little, SpO2 is still maintained.   Stairs             Wheelchair Mobility    Modified Rankin (Stroke Patients Only)       Balance Overall balance assessment: Needs assistance Sitting-balance support: Feet supported;No upper extremity supported Sitting balance-Leahy Scale: Good     Standing balance support: During functional activity;Bilateral upper extremity supported;No upper extremity supported Standing balance-Leahy Scale: Fair Standing balance comment: prefers AD for assist                            Cognition Arousal/Alertness: Awake/alert Behavior During Therapy: WFL for tasks assessed/performed Overall Cognitive Status: Within Functional Limits for tasks assessed  Exercises General Exercises - Lower Extremity Hip ABduction/ADduction: AROM;Strengthening;Both;10 reps;Standing Hip Flexion/Marching:  AROM;Strengthening;Both;10 reps;Standing    General Comments        Pertinent Vitals/Pain Pain Assessment: No/denies pain Pain Intervention(s): Monitored during session    Home Living                      Prior Function            PT Goals (current goals can now be found in the care plan section) Acute Rehab PT Goals Patient Stated Goal: It would be great to go home if I can't go to rehab PT Goal Formulation: With patient Time For Goal Achievement: 08/14/21 Potential to Achieve Goals: Good Progress towards PT goals: Progressing toward goals    Frequency    Min 3X/week      PT Plan Current plan remains appropriate    Co-evaluation              AM-PAC PT "6 Clicks" Mobility   Outcome Measure  Help needed turning from your back to your side while in a flat bed without using bedrails?: A Little Help needed moving from lying on your back to sitting on the side of a flat bed without using bedrails?: A Little Help needed moving to and from a bed to a chair (including a wheelchair)?: A Little Help needed standing up from a chair using your arms (e.g., wheelchair or bedside chair)?: A Little Help needed to walk in hospital room?: A Little Help needed climbing 3-5 steps with a railing? : A Little 6 Click Score: 18    End of Session   Activity Tolerance: Patient tolerated treatment well;Patient limited by fatigue Patient left: in chair;with call bell/phone within reach;with chair alarm set Nurse Communication: Mobility status PT Visit Diagnosis: Other abnormalities of gait and mobility (R26.89);Muscle weakness (generalized) (M62.81)     Time: 3832-9191 PT Time Calculation (min) (ACUTE ONLY): 33 min  Charges:  $Gait Training: 8-22 mins $Therapeutic Exercise: 8-22 mins                     08/06/2021  Christopher Carne., PT Acute Rehabilitation Services [phone]  (pager) [phone]  (office)   Tessie Fass Trameka Dorough 08/06/2021, 1:01 PM

## 2021-08-06 NOTE — TOC Progression Note (Addendum)
Transition of Care [facility]) - Progression Note    Patient Details  Name: Christopher Santiago MRN: [id number] Date of Birth: 1931/08/05  Transition of [facility]) CM/SW Contact  [name], LCSW Phone Number: 08/06/2021, 3:59 PM  Clinical Narrative:   CSW met with pt, daughter [name], and wife [name] was on the phone to discuss DC plan.  CSW shared that have been unable to find SNF placement in the area, further efforts would require expanding the search outside of the [place] area.  They are not in favor of this due to travel issues.  Discussed potential working towards pt returning home with support and what that would look like.  Daughter and [place] aide continue to help with Brenda's needs.  Aide is in the home 4 days per week, daughter is there over night.  Daughter reports her plan is to continue this and she has already made some adjustments at work to make herself more available.  Discussed need for training with respiratory on trach care, discussed outpt palliative services.  All three of them are in favor of moving in this direction.  Choice document for [facility] given.  CSW will begin to work on this plan.    CSW spoke to Ashely with respiratory and she asked that trach training be scheduled for the day before DC.      Expected Discharge Plan: [place] Barriers to Discharge: Continued Medical Work up, SNF Pending bed offer, Other (must enter comment) (pt has new trach placed on 07/02/21)  Expected Discharge Plan and Services Expected Discharge Plan: [place] Choice: [place] arrangements for the past 2 months: Single Family Home                                       Social Determinants of Health (SDOH) Interventions    Readmission Risk Interventions No flowsheet data found.

## 2021-08-07 DIAGNOSIS — J9601 Acute respiratory failure with hypoxia: Secondary | ICD-10-CM | POA: Diagnosis not present

## 2021-08-07 DIAGNOSIS — I10 Essential (primary) hypertension: Secondary | ICD-10-CM | POA: Diagnosis not present

## 2021-08-07 DIAGNOSIS — J441 Chronic obstructive pulmonary disease with (acute) exacerbation: Secondary | ICD-10-CM | POA: Diagnosis not present

## 2021-08-07 DIAGNOSIS — F0390 Unspecified dementia without behavioral disturbance: Secondary | ICD-10-CM | POA: Diagnosis not present

## 2021-08-07 LAB — GLUCOSE, CAPILLARY
Glucose-Capillary: 157 mg/dL — ABNORMAL HIGH (ref 70–99)
Glucose-Capillary: 116 mg/dL — ABNORMAL HIGH (ref 70–99)
Glucose-Capillary: 118 mg/dL — ABNORMAL HIGH (ref 70–99)
Glucose-Capillary: 111 mg/dL — ABNORMAL HIGH (ref 70–99)
Glucose-Capillary: 127 mg/dL — ABNORMAL HIGH (ref 70–99)

## 2021-08-07 NOTE — Progress Notes (Signed)
Nutrition Follow-up  DOCUMENTATION CODES:  Severe malnutrition in context of chronic illness  INTERVENTION:  Continue TF via PEG: -Osmolite 1.5 cal @ 55 ml/hr (1320 ml/day) -ProSource TF 45 ml BID -187m free water flushes Q4H   Provides 2060 kcal, 105 grams of protein, and 1006 ml of H2O (1906 ml free water total with flushes)  NUTRITION DIAGNOSIS:  Severe Malnutrition related to chronic illness (COPD) as evidenced by severe fat depletion, severe muscle depletion. - ongoing  GOAL:  Patient will meet greater than or equal to 90% of their needs - met with TF  MONITOR:  Diet advancement, Labs, Weight trends, Skin, I & O's  REASON FOR ASSESSMENT:  Consult Assessment of nutrition requirement/status  ASSESSMENT:  87year old male who presented to the ED on 8/09 with SOB and dysphagia. PMH of COPD, CAD, depression, DM, HTN, HLD, laryngeal cancer treated ~30 years ago, OSA. Further work-up revealed pt had squamous cell carcinoma of the larynx.   8/10 - s/p laryngeal biopsy and tracheostomy 8/12 - Cortrak placed, tip of tube in stomach 8/15 - failed MBSS; trach exchanged to #6 XLT-P uncuffed 8/20 - Cortrak pulled by patient 8/22 - Cortrak replaced (gastric tip) 8/23 - PEG placed; trach exchanged (still #6 XLT-P uncuffed)  Pt's wife, daughter, and [place] to receive trach training tomorrow. Continues to await SNF bed offer. Per MD, pt may need to discharge home with likely plan of outpatient hospice vs palliative care f/u if no SNF bed available.   Pt utilizing PMSV in place on trach collar. Remains NPO, tolerating TF via PEG per RN. SLP following for appropriateness of repeat MBSS to determine if any safe PO or comfort PO may be indicated.   Current TF regimen: Osmolite 1.5 cal @ 55 ml/hr (1320 ml/day) w/ ProSource TF 45 ml BID and 1556mfree water flushes Q4H. This provides 2060 kcal, 105 grams of protein, and 1006 ml of H2O (1906 ml free water total with flushes).   UOP: 160016mdocumented x24 hours I/O: -4632m74mnce admit  Admit weight: 70.8 kg Current weight: 71 kg   Medications: SSI Q4H, Miralax, Senokot-S Labs reviewed. CBGs: 107-157 x 24 hours  Diet Order:   Diet Order             Diet NPO time specified Except for: Ice Chips  Diet effective now                  EDUCATION NEEDS:  No education needs have been identified at this time  Skin:  Skin Assessment: Skin Integrity Issues: Skin Integrity Issues:: Incisions Incisions: neck, abdomen  Last BM:  9/13  Height:  Ht Readings from Last 1 Encounters:  07/02/21 5' 6"  (1.676 m)   Weight:  Wt Readings from Last 1 Encounters:  08/07/21 71 kg   BMI:  Body mass index is 25.26 kg/m.  Estimated Nutritional Needs:  Kcal:  2000-2200 kcal Protein:  95-115 grams Fluid:  >/= 2 L/day   AmanLarkin Ina, RD, LDN (she/her/hers) RD pager number and weekend/on-call pager number located in [place]

## 2021-08-07 NOTE — Progress Notes (Signed)
Pt had a urine output of 546ml. Pt urine appeared yellow, cloudy and thick (jello-like). MD notified.

## 2021-08-07 NOTE — TOC Progression Note (Addendum)
Transition of Care [facility]) - Progression Note    Patient Details  Name: Christopher Santiago MRN: [id number] Date of Birth: 01/31/1931  Transition of [facility]) CM/SW Contact  [name], LCSW Phone Number: 08/07/2021, 2:08 PM  Clinical Narrative:   CSW spoke with wife Christopher Santiago regarding trach training--they can all be here (wife, daughter, [facility] aide) tomorrow around 1pm.  CSW spoke with Judeen Hammans in Respiratory and they will plan to meet family for training around this time as well.    1500: hospital bed ordered from [place]    Expected Discharge Plan: [place] Barriers to Discharge: Continued Medical Work up, SNF Pending bed offer, Other (must enter comment) (pt has new trach placed on 07/02/21)  Expected Discharge Plan and Services Expected Discharge Plan: [place] Choice: [place] arrangements for the past 2 months: Single Family Home                                       Social Determinants of Health (SDOH) Interventions    Readmission Risk Interventions No flowsheet data found.

## 2021-08-07 NOTE — Progress Notes (Addendum)
Pt is using passey muir valve. Pt tolerating well, no complaints at this time.

## 2021-08-07 NOTE — Progress Notes (Addendum)
PROGRESS NOTE    Christopher Santiago  XKP:[id number] DOB: [birth year] DOA: 07/01/2021 PCP: Tonia Ghent, MD   Brief Narrative:   Christopher Santiago is an 87 year old male with past medical history significant for COPD, type 2 diabetes mellitus, essential hypertension, laryngeal cancer presented to hospital with exertional dyspnea.  He also had hoarseness and postnasal drip and was initially admitted to hospital for acute respiratory failure secondary to COPD exacerbation.  CT scan of the neck showed asymmetric soft tissue density and fullness of the base of the right aryepiglottic fold extending into the right piriform posterior so ENT was consulted and underwent endoscopy with biopsy and tracheostomy on 07/02/2021.  Assessment & Plan:   Principal Problem:   Acute respiratory failure with hypoxia (HCC) Active Problems:   Essential hypertension   COPD with acute exacerbation (HCC)   Left renal mass   Respiratory failure, acute (HCC)   Protein-calorie malnutrition, severe   Tracheostomy status (HCC)   Tracheobronchitis   Dementia ([place])  Acute hypoxic respiratory failure multifactorial thought to be  secondary to aspiration secondary to dysphagia,COPD exacerbation with recurrent laryngeal cancer.  Seen by ENT. On trach collar at this time.  Continue nebulizers.  Patient completed 5-day course of IV steroids and 7-day course of cefepime.    Recurrent squamous cell carcinoma larynx Seen by ENT and underwent biopsy and tracheostomy 8/11, by Dr. Fredric Dine.  Speech therapy following.  Status post PEG tube placement by General Surgery, Dr. Bobbye Morton 8/23; continue tube feeds.  Scopolamine patch.  Patient will need to follow-up with outpatient ENT, medical/radiation oncology.  Patient used to follow Dr. [name] ENT in the past and has been seen by Dr. Hartford Poli, Annette Stable this time.  Patient had refused to radiation but will be willing to follow-up as outpatient  Left renal mass 3.0 x 2.5 cm right  adrenal mass likely secondary to adenoma.  Will need to follow-up with urology as outpatient.  Right upper lobe nodule Mildly spiculated right upper lobe nodule 0.9 x 0.6 cm extending to the pleural surface.  Outpatient follow-up with oncology.  Could be malignant.  Bradycardia/sinus pause Patient was noted to have 3.5-second pause but cardiology did not recommend any intervention since the patient was asymptomatic  Essential hypertension Continue amlodipine.  Avoid beta-blockers or nodal blocking agents.  Type 2 diabetes mellitus Hemoglobin A1c 5.8, continue sliding scale insulin  Severe protein calorie malnutrition: Continue tube feeds.  Body mass index is 25.26 kg/m. Nutrition Status: Nutrition Problem: Severe Malnutrition Etiology: chronic illness (COPD) Signs/Symptoms: severe fat depletion, severe muscle depletion Interventions: Refer to RD note for recommendations  Weakness/debility/deconditioning: PT/OT recommends CIR initially, which was declined by his insurance company.  Difficult placement given his tracheostomy.  Likely home with home health if skilled nursing facility is not available.  Will need tracheostomy training.  DVT prophylaxis: Place and maintain sequential compression device Start: 07/04/21 1027    Code Status: DNR  Family Communication:  Spoke with the patient's wife on the phone and updated her about the clinical condition of the patient and potential for discharge tomorrow.  Disposition Plan:   Status is: Inpatient  Remains inpatient appropriate because:Unsafe d/c plan and Inpatient level of care appropriate due to severity of illness  Dispo: The patient is from: Home              Anticipated d/c is to: SNF if not available, likely home with home health tomorrow.  Patient currently is not medically stable to d/c.   Difficult to place patient No   Consultants:  Palliative care Radiation oncology ENT, Dr.  Fredric Dine PCCM Interventional radiology General Surgery for PEG tube placement  Procedures:  MICROLARYNGOSCOPY WITH BIOPSY 8/10; Dr Fredric Dine TRACHEOSTOMY 8/10, Dr. Fredric Dine PEG tube placement, General Surgery; Dr. Bobbye Morton 8/23  Antimicrobials:  None at this time.  Subjective: Today, patient was seen and examined at bedside.  Complains of mild cough.  Denies overt pain.  Feels constipated.   Objective: Vitals:   08/07/21 0500 08/07/21 0804 08/07/21 0813 08/07/21 1308  BP:   (!) 115/52 (!) 141/56  Pulse:   66 71  Resp:  (!) 22 13 18   Temp:   98.5 F (36.9 C)   TempSrc:   Oral   SpO2:   100% 100%  Weight: 71 kg     Height:        Intake/Output Summary (Last 24 hours) at 08/07/2021 1420 Last data filed at 08/07/2021 0813 Gross per 24 hour  Intake 150 ml  Output 1300 ml  Net -1150 ml    Filed Weights   08/04/21 0500 08/05/21 0344 08/07/21 0500  Weight: 67.9 kg 70 kg 71 kg    Physical examination: General:   not in obvious distress, chronically ill, cachectic HENT:   No scleral pallor or icterus noted. Oral mucosa is moist.  Tracheostomy collar in place, 5 L of oxygen. Chest:    Diminished breath sounds bilaterally.  CVS: S1 &S2 heard. No murmur.  Regular rate and rhythm. Abdomen: Soft, nontender, nondistended.  Bowel sounds are heard.   Extremities: No cyanosis, clubbing or edema.  Peripheral pulses are palpable. Psych: Alert, awake and oriented, normal mood CNS:  No cranial nerve deficits.  Power equal in all extremities.   Skin: Warm and dry.  No rashes noted.  Data Reviewed: I have personally reviewed the following labs and imaging studies.    CBC: Recent Labs  Lab 08/01/21 0103 08/03/21 0350 08/04/21 0222 08/06/21 0209  WBC 8.9 11.9* 10.1 9.6  NEUTROABS 6.7 9.9* 7.8* 7.4  HGB 9.9* 10.1* 9.8* 10.6*  HCT 31.9* 32.4* 31.7* 33.8*  MCV 95.2 95.9 96.4 95.2  PLT 187 181 179 388    Basic Metabolic Panel: Recent Labs  Lab 08/01/21 0103 08/03/21 0350  08/04/21 0222 08/06/21 0209  NA 136 139 137 138  K 4.2 4.3 4.1 4.3  CL 102 102 101 99  CO2 28 29 28 28   GLUCOSE 130* 173* 124* 152*  BUN 33* 35* 32* 37*  CREATININE 0.73 0.81 0.80 0.80  CALCIUM 9.2 9.1 9.0 9.4  MG 2.2 2.2 2.3 2.2  PHOS 3.7 3.2 3.6 3.5    GFR: Estimated Creatinine Clearance: 57.6 mL/min (by C-G formula based on SCr of 0.8 mg/dL). Liver Function Tests: Recent Labs  Lab 08/01/21 0103 08/03/21 0350 08/04/21 0222 08/06/21 0209  AST 16 15 14* 17  ALT 20 15 14 12   ALKPHOS 67 79 72 76  BILITOT 0.6 0.4 0.5 0.8  PROT 6.5 6.5 6.2* 6.8  ALBUMIN 2.6* 2.6* 2.4* 2.7*    No results for input(s): LIPASE, AMYLASE in the last 168 hours. No results for input(s): AMMONIA in the last 168 hours. Coagulation Profile: No results for input(s): INR, PROTIME in the last 168 hours. Cardiac Enzymes: No results for input(s): CKTOTAL, CKMB, CKMBINDEX, TROPONINI in the last 168 hours. BNP (last 3 results) Recent Labs    11/08/20 1106  PROBNP 482.0*    HbA1C:  No results for input(s): HGBA1C in the last 72 hours. CBG: Recent Labs  Lab 08/06/21 1927 08/06/21 2335 08/07/21 0407 08/07/21 0755 08/07/21 1144  GLUCAP 107* 115* 157* 116* 118*    Lipid Profile: No results for input(s): CHOL, HDL, LDLCALC, TRIG, CHOLHDL, LDLDIRECT in the last 72 hours. Thyroid Function Tests: No results for input(s): TSH, T4TOTAL, FREET4, T3FREE, THYROIDAB in the last 72 hours. Anemia Panel: No results for input(s): VITAMINB12, FOLATE, FERRITIN, TIBC, IRON, RETICCTPCT in the last 72 hours. Sepsis Labs: No results for input(s): PROCALCITON, LATICACIDVEN in the last 168 hours.  No results found for this or any previous visit (from the past 240 hour(s)).     Radiology Studies: No results found.    Scheduled Meds:  acetaminophen  500 mg Per Tube TID   allopurinol  100 mg Oral Daily   amLODipine  5 mg Oral Daily   arformoterol  15 mcg Nebulization BID   budesonide (PULMICORT)  nebulizer solution  0.25 mg Nebulization BID   chlorhexidine gluconate (MEDLINE KIT)  15 mL Mouth Rinse BID   Chlorhexidine Gluconate Cloth  6 each Topical Daily   feeding supplement (PROSource TF)  45 mL Per Tube BID   free water  150 mL Per Tube Q4H   guaiFENesin  10 mL Oral Q8H   insulin aspart  0-9 Units Subcutaneous Q4H   lidocaine  1 application Urethral Once   mouth rinse  15 mL Mouth Rinse 10 times per day   nystatin  5 mL Oral QID   polyethylene glycol  17 g Per Tube BID   revefenacin  175 mcg Nebulization Daily   scopolamine  1 patch Transdermal Q72H   senna-docusate  1 tablet Per Tube BID   sodium chloride flush  10-40 mL Intracatheter Q12H   Continuous Infusions:  feeding supplement (OSMOLITE 1.5 CAL) 1,000 mL (08/07/21 0440)     LOS: 37 days   Christopher Lipps, MD [facility] Available via Epic secure chat 7am-7pm After these hours, please refer to coverage provider listed on [email or web address] 08/07/2021, 2:20 PM

## 2021-08-07 NOTE — Progress Notes (Signed)
Speech Language Pathology Treatment: Dysphagia  Patient Details Name: Christopher Santiago MRN: [id number] DOB: 1931-07-18 Today's Date: 08/07/2021 Time: 9458-5929 SLP Time Calculation (min) (ACUTE ONLY): 12 min  Assessment / Plan / Recommendation Clinical Impression  SLP followed up for PO trials to reassess clinical tolerance. Pt utilizing PMSV in place on trach collar with good phonation and communication intelligibility. He continues to exhibit frequent expectoration of mucous with volitional cough and suction via yankauer. Performed diligent oral care. Single ice chips via teaspoon assessed x2. Pt with adequate mastication. Following palpable swallow pt exhibited some delayed coughing with likely reduced airway protection. Most recent MBSS completed 8/15 (pt has since been utilizing PMSV consistently and s/p PEG; he did have cortrak at time of prior MBSS). Will continue to follow for appropriateness of repeat MBSS to determine if any safe PO or comfort PO may be indicated.   HPI HPI: Pt is an 87 y/o male admitted 8/10 secondary to worsening SOB, especially with exertion. Found to have acute respiratory failure with hypoxia. Imaging showed fullness of the right aryepiglottic fold extending into the right pyriform and L renal mass. Pt had flexible laryngoscopy on 8/10, including trach.  Findings revealed bilateral true vocal folds in paramedian position with normal adduction, restricted abduction causing narrowing of the airway. No evidence of airway obstruction. Mucosal abnormality/ edema of right supraglottis/glottis noted with no exophytic mass lesion, biopsy showed squamous cell carcinoma. PEG placement 07/15/21.  PMH includes vocal cord cancer ~ 30 years ago, COPD, CAD, DM, HTN, R THA, bilateral hearing aids.      SLP Plan  Continue with current plan of care      Recommendations for follow up therapy are one component of a multi-disciplinary discharge planning process, led by the attending  physician.  Recommendations may be updated based on patient status, additional functional criteria and insurance authorization.    Recommendations  Diet recommendations: NPO Medication Administration: Via alternative means      Patient may use Passy-Muir Speech Valve: During all waking hours (remove during sleep) PMSV Supervision: Intermittent MD: Please consider changing trach tube to : Smaller size         Oral Care Recommendations: Oral care QID Follow up Recommendations: Home health SLP SLP Visit Diagnosis: Dysphagia, pharyngeal phase (R13.13) Plan: Continue with current plan of care       GO               Hayden Rasmussen MA, CCC-SLP Acute Rehabilitation Services   08/07/2021, 12:38 PM

## 2021-08-08 DIAGNOSIS — F0390 Unspecified dementia without behavioral disturbance: Secondary | ICD-10-CM | POA: Diagnosis not present

## 2021-08-08 DIAGNOSIS — J9601 Acute respiratory failure with hypoxia: Secondary | ICD-10-CM | POA: Diagnosis not present

## 2021-08-08 DIAGNOSIS — I10 Essential (primary) hypertension: Secondary | ICD-10-CM | POA: Diagnosis not present

## 2021-08-08 DIAGNOSIS — J441 Chronic obstructive pulmonary disease with (acute) exacerbation: Secondary | ICD-10-CM | POA: Diagnosis not present

## 2021-08-08 LAB — GLUCOSE, CAPILLARY
Glucose-Capillary: 130 mg/dL — ABNORMAL HIGH (ref 70–99)
Glucose-Capillary: 113 mg/dL — ABNORMAL HIGH (ref 70–99)
Glucose-Capillary: 142 mg/dL — ABNORMAL HIGH (ref 70–99)
Glucose-Capillary: 102 mg/dL — ABNORMAL HIGH (ref 70–99)
Glucose-Capillary: 110 mg/dL — ABNORMAL HIGH (ref 70–99)
Glucose-Capillary: 62 mg/dL — ABNORMAL LOW (ref 70–99)
Glucose-Capillary: 87 mg/dL (ref 70–99)

## 2021-08-08 MED ORDER — FREE WATER
150.0000 mL | Freq: Four times a day (QID) | Status: DC
  Administered 2021-08-08 – 2021-08-12 (×16): 150 mL

## 2021-08-08 MED ORDER — OSMOLITE 1.5 CAL PO LIQD
355.0000 mL | Freq: Three times a day (TID) | ORAL | Status: DC
  Administered 2021-08-08 – 2021-08-11 (×12): 355 mL
  Filled 2021-08-08 (×7): qty 474
  Filled 2021-08-08 (×2): qty 4266
  Filled 2021-08-08 (×3): qty 474
  Filled 2021-08-08: qty 4266
  Filled 2021-08-08 (×2): qty 474
  Filled 2021-08-08: qty 4266

## 2021-08-08 MED ORDER — DEXTROSE 50 % IV SOLN
INTRAVENOUS | Status: AC
  Filled 2021-08-08: qty 50

## 2021-08-08 MED ORDER — GLUCOSE 4 G PO CHEW
CHEWABLE_TABLET | ORAL | Status: AC
  Filled 2021-08-08: qty 1

## 2021-08-08 NOTE — TOC Progression Note (Addendum)
Transition of Care [facility]) - Progression Note    Patient Details  Name: KEVAUGHN EWING MRN: [id number] Date of Birth: 26-Jul-1931  [facility] of [facility] CM/SW Contact  [name], LCSW Phone Number: 08/08/2021, 2:11 PM  Clinical Narrative:   1200: Efforts to locate [facility] services: [facility]: declines [facility]: declines [facility]: declines [facility]: declines [facility]: currently reviewing referral    1330: Respiratory in room with family members for trach training. 1345: trach supplies order faxed to [place] with [facility].  1500: CSW met with pt, wife, daughter in room.  They are waiting on respiratory to continue teaching, already planning to have a second session on Monday, 9/19.  They have not heard from [employer] regarding the hospital bed yet.  CSW spoke with Freda Munro at [facility] who said bed can be delivered today and she will contact the family to schedule.      [facility] initially declined thinking pt was DC today.  May be willing to consider with more information on Monday.     Expected Discharge Plan: [place] Barriers to Discharge: Continued Medical Work up, SNF Pending bed offer, Other (must enter comment) (pt has new trach placed on 07/02/21)  Expected Discharge Plan and Services Expected Discharge Plan: [place] Choice: [place] arrangements for the past 2 months: Single Family Home                                       Social Determinants of Health (SDOH) Interventions    Readmission Risk Interventions No flowsheet data found.

## 2021-08-08 NOTE — Plan of Care (Signed)
  Problem: Education: Goal: Knowledge of General Education information will improve Description: Including pain rating scale, medication(s)/side effects and non-pharmacologic comfort measures Outcome: Progressing   Problem: Health Behavior/Discharge Planning: Goal: Ability to manage health-related needs will improve Outcome: Progressing   Problem: Clinical Measurements: Goal: Ability to maintain clinical measurements within normal limits will improve Outcome: Progressing Goal: Will remain free from infection Outcome: Progressing Goal: Diagnostic test results will improve Outcome: Progressing Goal: Respiratory complications will improve Outcome: Progressing Goal: Cardiovascular complication will be avoided Outcome: Progressing   Problem: Activity: Goal: Risk for activity intolerance will decrease Outcome: Progressing   Problem: Nutrition: Goal: Adequate nutrition will be maintained Outcome: Progressing   Problem: Coping: Goal: Level of anxiety will decrease Outcome: Progressing   Problem: Elimination: Goal: Will not experience complications related to bowel motility Outcome: Progressing Goal: Will not experience complications related to urinary retention Outcome: Progressing   Problem: Pain Managment: Goal: General experience of comfort will improve Outcome: Progressing   Problem: Safety: Goal: Ability to remain free from injury will improve Outcome: Progressing   Problem: Skin Integrity: Goal: Risk for impaired skin integrity will decrease Outcome: Progressing   Problem: Education: Goal: Knowledge about tracheostomy care/management will improve Outcome: Progressing   Problem: Activity: Goal: Ability to tolerate increased activity will improve Outcome: Progressing   Problem: Health Behavior/Discharge Planning: Goal: Ability to manage tracheostomy will improve Outcome: Progressing   Problem: Respiratory: Goal: Patent airway maintenance will  improve Outcome: Progressing   Problem: Role Relationship: Goal: Ability to communicate will improve Outcome: Progressing   Problem: Safety: Goal: Non-violent Restraint(s) Outcome: Progressing

## 2021-08-08 NOTE — Progress Notes (Addendum)
Respiratory Care/ Programmer, systems Family in today for education and training with trach care for possible discharge to home with Christopher Santiago.  Christopher Santiago and her two daughters Christopher Santiago and Christopher Santiago were very pleasant and eager to learn. Both daughters were given trach education for home care packets to take home a review.  Spent an hour with the family.  Was able to teach suctioning/ trach collar change, trach cleaning change of drain gauze and  replacing inner cannula. Taught both ladies how to put on sterile vs non sterile gloves . How to take off and place back the [name] valve.  Both daughters were able to do some hands on and return demonstration.  They both will need more training and are willing to come in on Monday the 19th at 11:00 am. Plan is to cover the above tasks again with more hands on  and return demonstration and allow both to suction as well.  Family is interested in having their dad come to the trach clinic post discharge.  I have given them the Respiratory Therapy Dept phone number [phone].  Plan is to try to set up trach clinic appt before discharge.

## 2021-08-08 NOTE — Progress Notes (Signed)
Occupational Therapy Treatment Patient Details Name: Christopher Santiago MRN: [id number] DOB: 04-09-1931 Today's Date: 08/08/2021   History of present illness 87 y/o male admitted 8/9 secondary to worsening SOB, especially with exertion. Found to have acute respiratory failure with hypoxia. Imaging showed fullness of the right aryepiglottic fold extending into the right pyriform and L renal mass. Pt found to have left renal mass 8/9. Pt had Flexible laryngoscopy on 8/10 with trach. PEG 8/23. Acute course complicated by metabolic encephalopathy. PMHx:vocal cord cancer, COPD, CAD, DM, HTN, and R THA, gout, PVD, BPH.   OT comments  Patient met lying supine in bed in agreement with OT treatment session. No c/o pain at rest or with activity. Noted bowel incontinence with patient seemingly unaware. Assist provided for hygiene/clothing management at bedlevel. Patient progressed from supine to EOB with supervision A and from EOB to recliner with Min guard and use of RW. VSS on 6L O2 via trach collar; FiO2 28%. Patient would benefit from continued acute OT services in prep for safe d/c to next level of care. Will further assess home DME and any DME needs at time of next treatment session.    Recommendations for follow up therapy are one component of a multi-disciplinary discharge planning process, led by the attending physician.  Recommendations may be updated based on patient status, additional functional criteria and insurance authorization.    Follow Up Recommendations  SNF    Equipment Recommendations  Other (comment) (Defer to next level of care.)    Recommendations for Other Services      Precautions / Restrictions Precautions Precautions: Fall Precaution Comments: trach, peg, monitor BP, incontinent Restrictions Weight Bearing Restrictions: No       Mobility Bed Mobility Overal bed mobility: Needs Assistance Bed Mobility: Supine to Sit Rolling: Supervision   Supine to sit: Supervision           Transfers Overall transfer level: Needs assistance Equipment used: Rolling walker (2 wheeled) Transfers: Sit to/from Stand Sit to Stand: Supervision Stand pivot transfers: Min guard            Balance Overall balance assessment: Needs assistance Sitting-balance support: Feet supported;No upper extremity supported Sitting balance-Leahy Scale: Good     Standing balance support: During functional activity;Bilateral upper extremity supported;No upper extremity supported Standing balance-Leahy Scale: Fair Standing balance comment: prefers AD for assist                           ADL either performed or assessed with clinical judgement   ADL Overall ADL's : Needs assistance/impaired             Lower Body Bathing: Min guard;Sit to/from stand Lower Body Bathing Details (indicate cue type and reason): Assist for washing front perineal area and buttocks at bedlevel after episode of bowel incontinence. Upper Body Dressing : Minimal assistance Upper Body Dressing Details (indicate cue type and reason): Donned anterior hosptial gown seated EOB. Lower Body Dressing: Min guard;Sitting/lateral leans Lower Body Dressing Details (indicate cue type and reason): Able to don footwear seated EOB without external assist.                     Vision       Perception     Praxis      Cognition Arousal/Alertness: Awake/alert Behavior During Therapy: WFL for tasks assessed/performed Overall Cognitive Status: Within Functional Limits for tasks assessed  Exercises     Shoulder Instructions       General Comments BP soft (has become common). SpO2 90's-100's on 6L via trach collar and FiO2 28%.    Pertinent Vitals/ Pain       Pain Assessment: No/denies pain  Home Living                                          Prior Functioning/Environment              Frequency  Min  2X/week        Progress Toward Goals  OT Goals(current goals can now be found in the care plan section)  Progress towards OT goals: Progressing toward goals  Acute Rehab OT Goals Patient Stated Goal: It would be great to go home if I can't go to rehab OT Goal Formulation: With patient Time For Goal Achievement: 08/20/21 Potential to Achieve Goals: Good ADL Goals Pt Will Perform Eating: Independently Pt Will Perform Grooming: with modified independence;standing Pt Will Perform Upper Body Dressing: Independently Pt Will Perform Lower Body Dressing: with modified independence;sit to/from stand Pt Will Transfer to Toilet: with modified independence;ambulating Pt Will Perform Toileting - Clothing Manipulation and hygiene: with modified independence;sit to/from stand Pt Will Perform Tub/Shower Transfer: Shower transfer;Tub transfer;rolling walker;shower seat Pt/caregiver will Perform Home Exercise Program: Increased ROM;Increased strength;Both right and left upper extremity  Plan Frequency remains appropriate;Discharge plan needs to be updated    Co-evaluation                 AM-PAC OT "6 Clicks" Daily Activity     Outcome Measure   Help from another person eating meals?: Total Help from another person taking care of personal grooming?: A Little Help from another person toileting, which includes using toliet, bedpan, or urinal?: A Little Help from another person bathing (including washing, rinsing, drying)?: A Little Help from another person to put on and taking off regular upper body clothing?: A Little Help from another person to put on and taking off regular lower body clothing?: A Little 6 Click Score: 16    End of Session Equipment Utilized During Treatment: Oxygen;Rolling walker  OT Visit Diagnosis: Unsteadiness on feet (R26.81);Muscle weakness (generalized) (M62.81)   Activity Tolerance Patient tolerated treatment well   Patient Left in chair;with call  bell/phone within reach;with chair alarm set   Nurse Communication Mobility status;Other (comment) (Loose stools)        Time: 7543-6067 OT Time Calculation (min): 27 min  Charges: OT General Charges $OT Visit: 1 Visit OT Treatments $Self Care/Home Management : 23-37 mins  Carmine Youngberg H. OTR/L Supplemental OT, Department of rehab services [phone]   Christo R H. 08/08/2021, 12:34 PM

## 2021-08-08 NOTE — Progress Notes (Signed)
Nutrition Follow-up  DOCUMENTATION CODES:  Severe malnutrition in context of chronic illness  INTERVENTION:  Transition to bolus TF via PEG: -1.5 cartons (364m) Osmolite 1.5 cal QID, flush with 655mfree water before and after each bolus -15068mdditional free water flush QID   Provides 2130 kcal, 89 grams of protein, and 1086 ml of H2O (2166 ml free water total with flushes) Meets 100% estimated nutrition needs  Also provided education on home TF  NUTRITION DIAGNOSIS:  Severe Malnutrition related to chronic illness (COPD) as evidenced by severe fat depletion, severe muscle depletion. - ongoing  GOAL:  Patient will meet greater than or equal to 90% of their needs - met with TF  MONITOR:  Labs, TF tolerance, Weight trends, Skin, I & O's  REASON FOR ASSESSMENT:  Consult Assessment of nutrition requirement/status  ASSESSMENT:  [age]ar old male who presented to the ED on 8/09 with SOB and dysphagia. PMH of COPD, CAD, depression, DM, HTN, HLD, laryngeal cancer treated ~30 years ago, OSA. Further work-up revealed pt had squamous cell carcinoma of the larynx.   8/10 - s/p laryngeal biopsy and tracheostomy 8/12 - Cortrak placed, tip of tube in stomach 8/15 - failed MBSS; trach exchanged to #6 XLT-P uncuffed 8/20 - Cortrak pulled by patient 8/22 - Cortrak replaced (gastric tip) 8/23 - PEG placed; trach exchanged (still #6 XLT-P uncuffed)  RD received Secure Chat from MD requesting recommendations for home TF regimen as MD plans to discharge pt home today after trach training. RD to provide home TF orders and education.   Current TF regimen: Osmolite 1.5 cal @ 55 ml/hr (1320 ml/day) w/ ProSource TF 45 ml BID and 150m66mee water flushes Q4H. This provides 2060 kcal, 105 grams of protein, and 1006 ml of H2O (1906 ml free water total with flushes). Will transition to bolus regimen given pt to discharge home.   UOP: 500mL52mumented x24 hours I/O: -1362mL 37me admit  Admit weight:  70.8 kg Current weight: 71 kg   Medications: SSI Q4H, Miralax, Senokot-S Labs reviewed. CBGs: 111-157 x 24 hours  Diet Order:   Diet Order             Diet NPO time specified Except for: Ice Chips  Diet effective now                  EDUCATION NEEDS:  Education needs have been addressed  Skin:  Skin Assessment: Skin Integrity Issues: Skin Integrity Issues:: Incisions Incisions: neck, abdomen  Last BM:  9/13  Height:  Ht Readings from Last 1 Encounters:  07/02/21 5' 6"  (1.676 m)   Weight:  Wt Readings from Last 1 Encounters:  08/07/21 71 kg   BMI:  Body mass index is 25.26 kg/m.  Estimated Nutritional Needs:  Kcal:  2000-2200 kcal Protein:  85-105 grams Fluid:  >/= 2 L/day   AmandaLarkin InaRD, LDN (she/her/hers) RD pager number and weekend/on-call pager number located in [email or web address]

## 2021-08-08 NOTE — Progress Notes (Signed)
Physical Therapy Treatment Patient Details Name: Christopher Santiago MRN: [id number] DOB: [birth year] Today's Date: 08/08/2021   History of Present Illness 87 y/o male admitted 8/9 secondary to worsening SOB, especially with exertion. Found to have acute respiratory failure with hypoxia. Imaging showed fullness of the right aryepiglottic fold extending into the right pyriform and L renal mass. Pt found to have left renal mass 8/9. Pt had Flexible laryngoscopy on 8/10 with trach. PEG 8/23. Acute course complicated by metabolic encephalopathy. PMHx:vocal cord cancer, COPD, CAD, DM, HTN, and R THA, gout, PVD, BPH.    PT Comments    Pt did well using rollator and has one at home. Amb distance limited by fatigue after pt needed extended time on BSC due to liquid stool. Appears that family is working toward pt returning home with family assist.    Recommendations for follow up therapy are one component of a multi-disciplinary discharge planning process, led by the attending physician.  Recommendations may be updated based on patient status, additional functional criteria and insurance authorization.  Follow Up Recommendations  Home health PT;Supervision for mobility/OOB     Equipment Recommendations  Hospital bed    Recommendations for Other Services       Precautions / Restrictions Precautions Precaution Comments: trach, peg, monitor BP, incontinent     Mobility  Bed Mobility Overal bed mobility: Needs Assistance Bed Mobility: Supine to Sit;Sit to Supine     Supine to sit: Supervision;HOB elevated Sit to supine: Supervision;HOB elevated   General bed mobility comments: Supervision for lines/tubes    Transfers Overall transfer level: Needs assistance Equipment used: 4-wheeled walker Transfers: Sit to/from Stand Sit to Stand: Supervision         General transfer comment: supervision for safety. Pt locked/unlocked brakes appropriately on rollator without  cues  Ambulation/Gait Ambulation/Gait assistance: Supervision Gait Distance (Feet): 160 Feet Assistive device: 4-wheeled walker Gait Pattern/deviations: Step-through pattern;Decreased stride length;Drifts right/left Gait velocity: decreased Gait velocity interpretation: 1.31 - 2.62 ft/sec, indicative of limited community ambulator General Gait Details: Assist for safety. Pt tends to get distracted looking in other rooms and drifts right/left   Stairs             Wheelchair Mobility    Modified Rankin (Stroke Patients Only)       Balance Overall balance assessment: Needs assistance Sitting-balance support: Feet supported;No upper extremity supported Sitting balance-Leahy Scale: Good     Standing balance support: No upper extremity supported Standing balance-Leahy Scale: Fair Standing balance comment: able to stand statically without UE support with supervision.                            Cognition Arousal/Alertness: Awake/alert Behavior During Therapy: WFL for tasks assessed/performed Overall Cognitive Status: Within Functional Limits for tasks assessed                                        Exercises      General Comments General comments (skin integrity, edema, etc.): Pt on RA with SpO2 >90%. Dyspnea 2/4 with amb      Pertinent Vitals/Pain Pain Assessment: No/denies pain    Home Living                      Prior Function            PT Goals (current  goals can now be found in the care plan section) Progress towards PT goals: Progressing toward goals    Frequency    Min 3X/week      PT Plan Discharge plan needs to be updated    Co-evaluation              AM-PAC PT "6 Clicks" Mobility   Outcome Measure  Help needed turning from your back to your side while in a flat bed without using bedrails?: None Help needed moving from lying on your back to sitting on the side of a flat bed without using  bedrails?: A Little Help needed moving to and from a bed to a chair (including a wheelchair)?: A Little Help needed standing up from a chair using your arms (e.g., wheelchair or bedside chair)?: A Little Help needed to walk in hospital room?: A Little Help needed climbing 3-5 steps with a railing? : A Little 6 Click Score: 19    End of Session Equipment Utilized During Treatment: Gait belt Activity Tolerance: Patient tolerated treatment well Patient left: in bed;with call bell/phone within reach;with bed alarm set Nurse Communication: Mobility status PT Visit Diagnosis: Other abnormalities of gait and mobility (R26.89);Muscle weakness (generalized) (M62.81)     Time: 1523-1600 PT Time Calculation (min) (ACUTE ONLY): 37 min  Charges:  $Gait Training: 23-37 mins                     [place] Pager [phone] Office [place] 08/08/2021, 5:30 PM

## 2021-08-08 NOTE — Progress Notes (Signed)
Hypoglycemic Event  @2119  CBG: 62  Treatment: Glucose tab via PEG tube due to no IV access  Symptoms: None  Follow-up CBG: Time: 2151 CBG Result:87  Possible Reasons for Event: Other: Bolus Tube feedings &NPO status  Comments/MD notified:[place]    Lossie Faes

## 2021-08-08 NOTE — Progress Notes (Signed)
PROGRESS NOTE    Christopher Santiago  NID:[id number] DOB: [birth year] DOA: 07/01/2021 PCP: Tonia Ghent, MD   Brief Narrative:   Christopher Santiago is an 87 year old male with past medical history significant for COPD, type 2 diabetes mellitus, essential hypertension, laryngeal cancer presented to hospital with exertional dyspnea.  Patient also had hoarseness and postnasal drip and was initially admitted to hospital for acute respiratory failure secondary to COPD exacerbation.  CT scan of the neck showed asymmetric soft tissue density and fullness of the base of the right aryepiglottic fold extending into the right piriform posterior so ENT was consulted and underwent endoscopy with biopsy and tracheostomy on 07/02/2021.  Assessment & Plan:   Principal Problem:   Acute respiratory failure with hypoxia (HCC) Active Problems:   Essential hypertension   COPD with acute exacerbation (HCC)   Left renal mass   Respiratory failure, acute (HCC)   Protein-calorie malnutrition, severe   Tracheostomy status (HCC)   Tracheobronchitis   Dementia ([place])  Acute hypoxic respiratory failure multifactorial thought to be  secondary to aspiration secondary to dysphagia,COPD exacerbation with recurrent laryngeal cancer.  Seen by ENT. On trach collar at this time.  Continue nebulizers.  Patient completed 5-day course of IV steroids and 7-day course of cefepime.  Family to learn about tracheostomy care at home.  Family coming to the hospital today.  Recurrent squamous cell carcinoma larynx Seen by ENT and underwent biopsy and tracheostomy 8/11, by Dr. Fredric Dine.  Speech therapy following.  Status post PEG tube placement by General Surgery, Dr. Bobbye Morton 8/23; continue tube feeds.  Scopolamine patch.  Patient will need to follow-up with outpatient ENT, medical/radiation oncology.  Patient used to follow Dr. [name] ENT in the past and has been seen by Dr. Hartford Poli, Annette Stable this time.  Patient had refused to radiation  but will be willing to follow-up as outpatient  Left renal mass 3.0 x 2.5 cm right adrenal mass likely secondary to adenoma.  Will need to follow-up with urology as outpatient.  Right upper lobe nodule Mildly spiculated right upper lobe nodule 0.9 x 0.6 cm extending to the pleural surface.  Outpatient follow-up with oncology.  Could be malignant.  Bradycardia/sinus pause Patient was noted to have 3.5-second pause but cardiology did not recommend any intervention since the patient was asymptomatic  Essential hypertension Continue amlodipine.  Avoid beta-blockers or nodal blocking agents.  Type 2 diabetes mellitus Hemoglobin A1c 5.8, continue sliding scale insulin, POC glucose of 102  Severe protein calorie malnutrition: On tube feeds.  Dietitian on board.  Recommend bolus feeding on discharge. Body mass index is 25.26 kg/m. Nutrition Status: Nutrition Problem: Severe Malnutrition Etiology: chronic illness (COPD) Signs/Symptoms: severe fat depletion, severe muscle depletion Interventions: Education, Tube feeding  Weakness/debility/deconditioning: PT/OT recommends CIR initially, which was declined by his insurance company.  Difficult placement given his tracheostomy.  Likely home with home health if skilled nursing facility is not available.  Awaiting home health set up, tracheostomy training and due for training by the family.  DVT prophylaxis: Place and maintain sequential compression device Start: 07/04/21 1027    Code Status: DNR  Family Communication:  None today.  Spoke with the patient's wife on the phone yesterday.  Disposition Plan:   Status is: Inpatient  Remains inpatient appropriate because:Unsafe d/c plan and Inpatient level of care appropriate due to severity of illness  Dispo: The patient is from: Home              Anticipated d/c  is to: Likely home with home health when home health arranged. Difficulty finding agency,  Transition of care working for this set  up.  Unable to find a skilled nursing facility so far              Patient currently is medically stable to d/c.   Difficult to place patient No   Consultants:  Palliative care Radiation oncology ENT, Dr. Fredric Dine PCCM Interventional radiology General Surgery for PEG tube placement  Procedures:  MICROLARYNGOSCOPY WITH BIOPSY 8/10; Dr Fredric Dine TRACHEOSTOMY 8/10, Dr. Fredric Dine PEG tube placement, General Surgery; Dr. Bobbye Morton 8/23  Antimicrobials:  None at this time.  Subjective: Today, patient was seen and examined at bedside.  Complains of mild cough otherwise feels okay.  Has had a bowel movement.  Objective: Vitals:   08/08/21 0753 08/08/21 1112 08/08/21 1200 08/08/21 1229  BP: (!) 131/59 (!) 127/48 (!) 134/50   Pulse: 70 67 73   Resp: 16 20 17    Temp: (!) 97.3 F (36.3 C) (!) 97.2 F (36.2 C)    TempSrc: Axillary Axillary    SpO2: 97% 95% 96% 93%  Weight:      Height:        Intake/Output Summary (Last 24 hours) at 08/08/2021 1328 Last data filed at 08/08/2021 1314 Gross per 24 hour  Intake 4538.84 ml  Output 500 ml  Net 4038.84 ml    Filed Weights   08/04/21 0500 08/05/21 0344 08/07/21 0500  Weight: 67.9 kg 70 kg 71 kg    Physical examination:  General:   not in obvious distress, chronically ill, cachectic  HENT:   No scleral pallor or icterus noted. Oral mucosa is moist.  Tracheostomy collar in place, 5 L of oxygen Chest:    Diminished breath sounds bilaterally.  CVS: S1 &S2 heard. No murmur.  Regular rate and rhythm. Abdomen: Soft, nontender, nondistended.  Bowel sounds are heard.   Extremities: No cyanosis, clubbing or edema.  Peripheral pulses are palpable. Psych: Alert, awake and oriented, normal mood CNS:  No cranial nerve deficits.  Power equal in all extremities.   Skin: Warm and dry.  No rashes noted.  Data Reviewed: I have personally reviewed the following labs and imaging studies.    CBC: Recent Labs  Lab 08/03/21 0350 08/04/21 0222  08/06/21 0209  WBC 11.9* 10.1 9.6  NEUTROABS 9.9* 7.8* 7.4  HGB 10.1* 9.8* 10.6*  HCT 32.4* 31.7* 33.8*  MCV 95.9 96.4 95.2  PLT 181 179 830    Basic Metabolic Panel: Recent Labs  Lab 08/03/21 0350 08/04/21 0222 08/06/21 0209  NA 139 137 138  K 4.3 4.1 4.3  CL 102 101 99  CO2 29 28 28   GLUCOSE 173* 124* 152*  BUN 35* 32* 37*  CREATININE 0.81 0.80 0.80  CALCIUM 9.1 9.0 9.4  MG 2.2 2.3 2.2  PHOS 3.2 3.6 3.5    GFR: Estimated Creatinine Clearance: 57.6 mL/min (by C-G formula based on SCr of 0.8 mg/dL). Liver Function Tests: Recent Labs  Lab 08/03/21 0350 08/04/21 0222 08/06/21 0209  AST 15 14* 17  ALT 15 14 12   ALKPHOS 79 72 76  BILITOT 0.4 0.5 0.8  PROT 6.5 6.2* 6.8  ALBUMIN 2.6* 2.4* 2.7*    No results for input(s): LIPASE, AMYLASE in the last 168 hours. No results for input(s): AMMONIA in the last 168 hours. Coagulation Profile: No results for input(s): INR, PROTIME in the last 168 hours. Cardiac Enzymes: No results for input(s): CKTOTAL, CKMB, CKMBINDEX,  TROPONINI in the last 168 hours. BNP (last 3 results) Recent Labs    11/08/20 1106  PROBNP 482.0*    HbA1C: No results for input(s): HGBA1C in the last 72 hours. CBG: Recent Labs  Lab 08/07/21 2058 08/07/21 2338 08/08/21 0424 08/08/21 0758 08/08/21 1114  GLUCAP 127* 113* 130* 142* 102*    Lipid Profile: No results for input(s): CHOL, HDL, LDLCALC, TRIG, CHOLHDL, LDLDIRECT in the last 72 hours. Thyroid Function Tests: No results for input(s): TSH, T4TOTAL, FREET4, T3FREE, THYROIDAB in the last 72 hours. Anemia Panel: No results for input(s): VITAMINB12, FOLATE, FERRITIN, TIBC, IRON, RETICCTPCT in the last 72 hours. Sepsis Labs: No results for input(s): PROCALCITON, LATICACIDVEN in the last 168 hours.  No results found for this or any previous visit (from the past 240 hour(s)).     Radiology Studies: No results found.    Scheduled Meds:  acetaminophen  500 mg Per Tube TID    allopurinol  100 mg Oral Daily   amLODipine  5 mg Oral Daily   arformoterol  15 mcg Nebulization BID   budesonide (PULMICORT) nebulizer solution  0.25 mg Nebulization BID   chlorhexidine gluconate (MEDLINE KIT)  15 mL Mouth Rinse BID   Chlorhexidine Gluconate Cloth  6 each Topical Daily   feeding supplement (OSMOLITE 1.5 CAL)  355 mL Per Tube TID WC & HS   free water  150 mL Per Tube QID   guaiFENesin  10 mL Oral Q8H   insulin aspart  0-9 Units Subcutaneous Q4H   lidocaine  1 application Urethral Once   mouth rinse  15 mL Mouth Rinse 10 times per day   nystatin  5 mL Oral QID   polyethylene glycol  17 g Per Tube BID   revefenacin  175 mcg Nebulization Daily   scopolamine  1 patch Transdermal Q72H   senna-docusate  1 tablet Per Tube BID   sodium chloride flush  10-40 mL Intracatheter Q12H   Continuous Infusions:     LOS: 38 days   Flora Lipps, MD [facility] Available via Epic secure chat 7am-7pm After these hours, please refer to coverage provider listed on [email or web address] 08/08/2021, 1:28 PM

## 2021-08-09 DIAGNOSIS — Z930 Tracheostomy status: Secondary | ICD-10-CM | POA: Diagnosis not present

## 2021-08-09 DIAGNOSIS — J9601 Acute respiratory failure with hypoxia: Secondary | ICD-10-CM | POA: Diagnosis not present

## 2021-08-09 DIAGNOSIS — E43 Unspecified severe protein-calorie malnutrition: Secondary | ICD-10-CM | POA: Diagnosis not present

## 2021-08-09 LAB — CBC
HCT: 32.8 % — ABNORMAL LOW (ref 39.0–52.0)
Hemoglobin: 10.3 g/dL — ABNORMAL LOW (ref 13.0–17.0)
MCH: 29.9 pg (ref 26.0–34.0)
MCHC: 31.4 g/dL (ref 30.0–36.0)
MCV: 95.3 fL (ref 80.0–100.0)
Platelets: 158 10*3/uL (ref 150–400)
RBC: 3.44 MIL/uL — ABNORMAL LOW (ref 4.22–5.81)
RDW: 13.6 % (ref 11.5–15.5)
WBC: 9.1 10*3/uL (ref 4.0–10.5)
nRBC: 0 % (ref 0.0–0.2)

## 2021-08-09 LAB — GLUCOSE, CAPILLARY
Glucose-Capillary: 86 mg/dL (ref 70–99)
Glucose-Capillary: 151 mg/dL — ABNORMAL HIGH (ref 70–99)
Glucose-Capillary: 179 mg/dL — ABNORMAL HIGH (ref 70–99)
Glucose-Capillary: 71 mg/dL (ref 70–99)
Glucose-Capillary: 93 mg/dL (ref 70–99)
Glucose-Capillary: 122 mg/dL — ABNORMAL HIGH (ref 70–99)
Glucose-Capillary: 122 mg/dL — ABNORMAL HIGH (ref 70–99)

## 2021-08-09 LAB — BASIC METABOLIC PANEL
Anion gap: 9 (ref 5–15)
BUN: 42 mg/dL — ABNORMAL HIGH (ref 8–23)
CO2: 29 mmol/L (ref 22–32)
Calcium: 9.2 mg/dL (ref 8.9–10.3)
Chloride: 99 mmol/L (ref 98–111)
Creatinine, Ser: 0.86 mg/dL (ref 0.61–1.24)
GFR, Estimated: >60 mL/min (ref >60)
Glucose, Bld: 119 mg/dL — ABNORMAL HIGH (ref 70–99)
Potassium: 4.3 mmol/L (ref 3.5–5.1)
Sodium: 137 mmol/L (ref 135–145)

## 2021-08-09 LAB — MAGNESIUM: Magnesium: 2.4 mg/dL (ref 1.7–2.4)

## 2021-08-09 NOTE — Plan of Care (Signed)
  Problem: Education: Goal: Knowledge of General Education information will improve Description: Including pain rating scale, medication(s)/side effects and non-pharmacologic comfort measures Outcome: Progressing   Problem: Health Behavior/Discharge Planning: Goal: Ability to manage health-related needs will improve Outcome: Progressing   Problem: Clinical Measurements: Goal: Ability to maintain clinical measurements within normal limits will improve Outcome: Progressing Goal: Will remain free from infection Outcome: Progressing Goal: Diagnostic test results will improve Outcome: Progressing Goal: Respiratory complications will improve Outcome: Progressing Goal: Cardiovascular complication will be avoided Outcome: Progressing   Problem: Activity: Goal: Risk for activity intolerance will decrease Outcome: Progressing   Problem: Nutrition: Goal: Adequate nutrition will be maintained Outcome: Progressing   Problem: Coping: Goal: Level of anxiety will decrease Outcome: Progressing   Problem: Elimination: Goal: Will not experience complications related to bowel motility Outcome: Progressing Goal: Will not experience complications related to urinary retention Outcome: Progressing   Problem: Pain Managment: Goal: General experience of comfort will improve Outcome: Progressing   Problem: Safety: Goal: Ability to remain free from injury will improve Outcome: Progressing   Problem: Skin Integrity: Goal: Risk for impaired skin integrity will decrease Outcome: Progressing   Problem: Education: Goal: Knowledge about tracheostomy care/management will improve Outcome: Progressing   Problem: Activity: Goal: Ability to tolerate increased activity will improve Outcome: Progressing   Problem: Health Behavior/Discharge Planning: Goal: Ability to manage tracheostomy will improve Outcome: Progressing   Problem: Respiratory: Goal: Patent airway maintenance will  improve Outcome: Progressing   Problem: Role Relationship: Goal: Ability to communicate will improve Outcome: Progressing   Problem: Safety: Goal: Non-violent Restraint(s) Outcome: Progressing

## 2021-08-09 NOTE — Progress Notes (Signed)
PROGRESS NOTE    Christopher Santiago  KWI:[id number] DOB: 1931/02/25 DOA: 07/01/2021 PCP: Tonia Ghent, MD   Brief Narrative:   Christopher Santiago is an 87 year old male with past medical history significant for COPD, type 2 diabetes mellitus, essential hypertension, laryngeal cancer presented to hospital with exertional dyspnea.  Patient also had hoarseness and postnasal drip and was initially admitted to hospital for acute respiratory failure secondary to COPD exacerbation.  CT scan of the neck showed asymmetric soft tissue density and fullness of the base of the right aryepiglottic fold extending into the right piriform posterior so ENT was consulted and underwent endoscopy with biopsy and tracheostomy on 07/02/2021.  Assessment & Plan:   Principal Problem:   Acute respiratory failure with hypoxia (HCC) Active Problems:   Essential hypertension   COPD with acute exacerbation (HCC)   Left renal mass   Respiratory failure, acute (HCC)   Protein-calorie malnutrition, severe   Tracheostomy status (HCC)   Tracheobronchitis   Dementia (HCC)  Acute hypoxic respiratory failure multifactorial- stable on trach collar with 5L O2.  - Continue nebulizers.  - continue trach care, oral suction - continued family education 9/19  Recurrent squamous cell carcinoma larynx - continue PEG feeds.   - Scopolamine patch.   - Patient will need to follow-up with outpatient ENT, medical/radiation oncology  Left renal mass 3.0 x 2.5 cm right adrenal mass likely secondary to adenoma.  Will need to follow-up with urology as outpatient.  Right upper lobe nodule Mildly spiculated right upper lobe nodule 0.9 x 0.6 cm extending to the pleural surface.  Outpatient follow-up with oncology.  Could be malignant.  Essential hypertension- stable.  Continue amlodipine.  Avoid beta-blockers or nodal blocking agents.  Type 2 diabetes mellitus- hgb A1c well below goal of 8 (5.8 on admission). Will discontinue sliding  scale at this time and continue to follow glucose on daily labs. - glucose monitoring with labs  Severe protein calorie malnutrition: On tube feeds.  Dietitian on board.  Recommend bolus feeding on discharge. Body mass index is 24.55 kg/m. Nutrition Status: Nutrition Problem: Severe Malnutrition Etiology: chronic illness (COPD) Signs/Symptoms: severe fat depletion, severe muscle depletion Interventions: Education, Tube feeding  Weakness/debility/deconditioning: - discharge pending placement of home vs SNF, appreciate TOC help  DVT prophylaxis: Place and maintain sequential compression device Start: 07/04/21 1027    Code Status: DNR  Family Communication:  None. Family updated at bedside yesterday and no changes in plan/care today.  Disposition Plan:  Status is: Inpatient  Remains inpatient appropriate because:Unsafe d/c plan and Inpatient level of care appropriate due to severity of illness  Dispo: The patient is from: Home              Anticipated d/c is to: Likely home with home health when home health arranged or SNF.              Patient currently is medically stable to d/c.   Difficult to place patient No   Consultants:  Palliative care Radiation oncology ENT, Dr. Fredric Dine PCCM Interventional radiology General Surgery for PEG tube placement  Procedures:  MICROLARYNGOSCOPY WITH BIOPSY 8/10; Dr Fredric Dine TRACHEOSTOMY 8/10, Dr. Fredric Dine PEG tube placement, General Surgery; Dr. Bobbye Morton 8/23  Antimicrobials:  None at this time.  Subjective: He denies any complaints today. He feels well overall besides sputum production but is well controlled with suction.  Objective: Vitals:   08/09/21 0500 08/09/21 0752 08/09/21 0759 08/09/21 1104  BP:  (!) 155/63  Pulse:  83    Resp:  20    Temp:  97.9 F (36.6 C)    TempSrc:  Axillary    SpO2:  98% 99% 99%  Weight: 69 kg     Height:        Intake/Output Summary (Last 24 hours) at 08/09/2021 1106 Last data filed  at 08/09/2021 0400 Gross per 24 hour  Intake 1010 ml  Output 1600 ml  Net -590 ml    Filed Weights   08/05/21 0344 08/07/21 0500 08/09/21 0500  Weight: 70 kg 71 kg 69 kg    Physical examination:  General:   not in obvious distress, chronically ill, cachectic  HENT:   No scleral pallor or icterus noted. Oral mucosa is moist.  Tracheostomy collar in place, 5 L of oxygen not in place Chest: CATB CVS: S1 &S2 heard. No murmur.  Regular rate and rhythm. Abdomen: Soft, nontender, nondistended. PEG in place  Extremities: No cyanosis, clubbing or edema.  Peripheral pulses are palpable. Psych: Alert, awake and oriented, normal mood CNS:  No cranial nerve deficits.  Power equal in all extremities.   Skin: Warm and dry.  No rashes noted.  Data Reviewed: I have personally reviewed the following labs and imaging studies.    CBC: Recent Labs  Lab 08/03/21 0350 08/04/21 0222 08/06/21 0209 08/09/21 0131  WBC 11.9* 10.1 9.6 9.1  NEUTROABS 9.9* 7.8* 7.4  --   HGB 10.1* 9.8* 10.6* 10.3*  HCT 32.4* 31.7* 33.8* 32.8*  MCV 95.9 96.4 95.2 95.3  PLT 181 179 182 308    Basic Metabolic Panel: Recent Labs  Lab 08/03/21 0350 08/04/21 0222 08/06/21 0209 08/09/21 0131  NA 139 137 138 137  K 4.3 4.1 4.3 4.3  CL 102 101 99 99  CO2 29 28 28 29   GLUCOSE 173* 124* 152* 119*  BUN 35* 32* 37* 42*  CREATININE 0.81 0.80 0.80 0.86  CALCIUM 9.1 9.0 9.4 9.2  MG 2.2 2.3 2.2 2.4  PHOS 3.2 3.6 3.5  --     GFR: Estimated Creatinine Clearance: 53.6 mL/min (by C-G formula based on SCr of 0.86 mg/dL). Liver Function Tests: Recent Labs  Lab 08/03/21 0350 08/04/21 0222 08/06/21 0209  AST 15 14* 17  ALT 15 14 12   ALKPHOS 79 72 76  BILITOT 0.4 0.5 0.8  PROT 6.5 6.2* 6.8  ALBUMIN 2.6* 2.4* 2.7*    BNP (last 3 results) Recent Labs    11/08/20 1106  PROBNP 482.0*    CBG: Recent Labs  Lab 08/08/21 2151 08/08/21 2343 08/09/21 0355 08/09/21 0431 08/09/21 0752  GLUCAP 87 179* 71 86 151*     Radiology Studies: No results found.   Scheduled Meds:  acetaminophen  500 mg Per Tube TID   allopurinol  100 mg Oral Daily   amLODipine  5 mg Oral Daily   arformoterol  15 mcg Nebulization BID   budesonide (PULMICORT) nebulizer solution  0.25 mg Nebulization BID   chlorhexidine gluconate (MEDLINE KIT)  15 mL Mouth Rinse BID   Chlorhexidine Gluconate Cloth  6 each Topical Daily   feeding supplement (OSMOLITE 1.5 CAL)  355 mL Per Tube TID WC & HS   free water  150 mL Per Tube QID   guaiFENesin  10 mL Oral Q8H   insulin aspart  0-9 Units Subcutaneous Q4H   lidocaine  1 application Urethral Once   mouth rinse  15 mL Mouth Rinse 10 times per day   nystatin  5  mL Oral QID   polyethylene glycol  17 g Per Tube BID   revefenacin  175 mcg Nebulization Daily   scopolamine  1 patch Transdermal Q72H   senna-docusate  1 tablet Per Tube BID   sodium chloride flush  10-40 mL Intracatheter Q12H   Continuous Infusions:     LOS: 39 days   Richarda Osmond, MD [facility] Available via Epic secure chat 7am-7pm After these hours, please refer to coverage provider listed on [email or web address] 08/09/2021, 11:06 AM

## 2021-08-10 DIAGNOSIS — I10 Essential (primary) hypertension: Secondary | ICD-10-CM | POA: Diagnosis not present

## 2021-08-10 DIAGNOSIS — J441 Chronic obstructive pulmonary disease with (acute) exacerbation: Secondary | ICD-10-CM | POA: Diagnosis not present

## 2021-08-10 DIAGNOSIS — N2889 Other specified disorders of kidney and ureter: Secondary | ICD-10-CM | POA: Diagnosis not present

## 2021-08-10 DIAGNOSIS — J9601 Acute respiratory failure with hypoxia: Secondary | ICD-10-CM | POA: Diagnosis not present

## 2021-08-10 LAB — GLUCOSE, CAPILLARY
Glucose-Capillary: 125 mg/dL — ABNORMAL HIGH (ref 70–99)
Glucose-Capillary: 180 mg/dL — ABNORMAL HIGH (ref 70–99)
Glucose-Capillary: 188 mg/dL — ABNORMAL HIGH (ref 70–99)
Glucose-Capillary: 192 mg/dL — ABNORMAL HIGH (ref 70–99)
Glucose-Capillary: 56 mg/dL — ABNORMAL LOW (ref 70–99)
Glucose-Capillary: 83 mg/dL (ref 70–99)
Glucose-Capillary: 84 mg/dL (ref 70–99)
Glucose-Capillary: 89 mg/dL (ref 70–99)

## 2021-08-10 MED ORDER — ORAL CARE MOUTH RINSE
15.0000 mL | Freq: Two times a day (BID) | OROMUCOSAL | Status: DC
  Administered 2021-08-11 – 2021-08-12 (×2): 15 mL via OROMUCOSAL

## 2021-08-10 NOTE — TOC Progression Note (Addendum)
Transition of Care [facility]) - Progression Note    Patient Details  Name: Christopher Santiago MRN: [id number] Date of Birth: 25-Nov-1930  [facility] CM/SW Contact  Carles Collet, RN Phone Number: 08/10/2021, 2:27 PM  Clinical Narrative:      Planning for patient to DC to home with initial trach placed on 8/10 and tube feeds with PEG placed on 8/23.  TRANSPORTATION Will transport home via [facility] to  [place] [address]  [facility] liaison confirms that case is being reviewed by liaison and upper level management. Liaison updated me that they are able to accept for start of care of Wednesday 9/21. Spoke w Andree Coss of [employer] and they will provide RT to home on day of DC to ensure trach is set up properly.  DME Willaim Sheng w [employer] states that trach supplies were shipped Friday 9/16 and should be to the home Tuesday 9/20.  Patient will need sent home with a 3 days worth of all trach supplies per [employer] protocol. Tube Feeds- Patient receiving osmolite 1.5 cartons QID for feeds (6 cartons a day) via bolus feeds. Order placed for home feeds and Zach w [employer] notified.  Patient will need sent home with 3 days of tube feeds (18 cartons) and supplies. Spoke w Mimi in the pharmacy to have 18 cans brought to the room to be sent home with the family. Feeds left on windowsill in room. Spoke again with [name] and she understands to bring these home in the AM, and also ask for 3 days of trach supplies as well. Order placed for nebulizer. Requested [employer] to deliver hospital bed, nebulizer, oxygen and suction to the house Monday afternoon after family returns home.  EDUCATION RD has instructed on bolus tube feeds.  RT has instructed on trach care, and will again on Monday at 11am. Spoke w daughter [name] who confirms that she will be staying with her parents at their house to provide 24 hour supervision and assistance.  Patient needs referral to [facility] for follow up. Referrals are accepted M-F from 9-3 at [place] [facility]   [phone]       Expected DC plan is to home with home health services and DME Barriers to Discharge: Continued Medical Work up, SNF Pending bed offer, Other (must enter comment) (pt has new trach placed on 07/02/21)  Expected Discharge Plan and Services Expected Discharge Plan: [place] Choice: [place] arrangements for the past 2 months: Single Family Home                                       Social Determinants of Health (SDOH) Interventions    Readmission Risk Interventions No flowsheet data found.

## 2021-08-10 NOTE — Progress Notes (Signed)
PROGRESS NOTE    Christopher Santiago  RJJ:[id number] DOB: February 14, 1931 DOA: 07/01/2021 PCP: Tonia Ghent, MD   Brief Narrative:   Christopher Santiago is an 86 year old male with past medical history significant for COPD, type 2 diabetes mellitus, essential hypertension, laryngeal cancer presented to hospital with exertional dyspnea.  Patient also had hoarseness and postnasal drip and was initially admitted to hospital for acute respiratory failure secondary to COPD exacerbation.  CT scan of the neck showed asymmetric soft tissue density and fullness of the base of the right aryepiglottic fold extending into the right piriform posterior so ENT was consulted and underwent endoscopy with biopsy and tracheostomy on 07/02/2021.  Assessment & Plan:   Principal Problem:   Acute respiratory failure with hypoxia (HCC) Active Problems:   Essential hypertension   COPD with acute exacerbation (HCC)   Left renal mass   Respiratory failure, acute (HCC)   Protein-calorie malnutrition, severe   Tracheostomy status (HCC)   Tracheobronchitis   Dementia (HCC)  Acute hypoxic respiratory failure multifactorial- stable on trach collar with 5L O2. Patient preference and family preference is to go home with home health as soon as possible. - Continue nebulizers.  - continue trach care, oral suction - continued family education 9/19  Recurrent squamous cell carcinoma larynx - continue PEG feeds.   - Scopolamine patch.   - Patient will need to follow-up with outpatient ENT, medical/radiation oncology  Left renal mass 3.0 x 2.5 cm right adrenal mass likely secondary to adenoma.  Will need to follow-up with urology as outpatient.  Right upper lobe nodule Mildly spiculated right upper lobe nodule 0.9 x 0.6 cm extending to the pleural surface.  Outpatient follow-up with oncology.  Could be malignant.  Essential hypertension- stable.  Continue amlodipine.  Avoid beta-blockers or nodal blocking agents.  Type 2  diabetes mellitus- hgb A1c well below goal of 8 (5.8 on admission). Will discontinue sliding scale at this time and continue to follow glucose on daily labs. - glucose monitoring with labs  Severe protein calorie malnutrition: On tube feeds.  Dietitian on board.  Recommend bolus feeding on discharge. Body mass index is 24.55 kg/m. Nutrition Status: Nutrition Problem: Severe Malnutrition Etiology: chronic illness (COPD) Signs/Symptoms: severe fat depletion, severe muscle depletion Interventions: Education, Tube feeding  Weakness/debility/deconditioning: - discharge pending set up of home with home health, appreciate TOC help  DVT prophylaxis: Place and maintain sequential compression device Start: 07/04/21 1027    Code Status: DNR  Family Communication:  None.   Disposition Plan:  Status is: Inpatient  Remains inpatient appropriate because:Unsafe d/c plan and Inpatient level of care appropriate due to severity of illness  Dispo: The patient is from: Home              Anticipated d/c is to: home with home health when home health arranged               Patient currently is medically stable to d/c.   Difficult to place patient No   Consultants:  Palliative care Radiation oncology ENT, Dr. Fredric Dine PCCM Interventional radiology General Surgery for PEG tube placement  Procedures:  MICROLARYNGOSCOPY WITH BIOPSY 8/10; Dr Fredric Dine TRACHEOSTOMY 8/10, Dr. Fredric Dine PEG tube placement, General Surgery; Dr. Bobbye Morton 8/23  Antimicrobials:  None at this time.  Subjective: Patient feels well today. He has no complaints. He is anxiously waiting to go home.   Objective: Vitals:   08/10/21 0345 08/10/21 0400 08/10/21 0500 08/10/21 0746  BP: (!) 122/57 128/63  140/70  Pulse: 70 69  70  Resp: 20 17  (!) 22  Temp: 98.4 F (36.9 C)   (!) 97.4 F (36.3 C)  TempSrc: Axillary   Oral  SpO2: 96% (!) 89%  96%  Weight:   69 kg   Height:        Intake/Output Summary (Last 24  hours) at 08/10/2021 0805 Last data filed at 08/10/2021 0300 Gross per 24 hour  Intake 1310 ml  Output 1100 ml  Net 210 ml    Filed Weights   08/07/21 0500 08/09/21 0500 08/10/21 0500  Weight: 71 kg 69 kg 69 kg    Physical examination:  General:   not in obvious distress, chronically ill, cachectic  HENT:   No scleral pallor or icterus noted. Oral mucosa is moist.  Tracheostomy collar in place, 5 L of oxygen not in place Chest: CATB CVS: S1 &S2 heard. No murmur.  Regular rate and rhythm. Abdomen: Soft, nontender, nondistended. PEG in place  Extremities: No cyanosis, clubbing or edema.  Peripheral pulses are palpable. Psych: Alert, awake and oriented, normal mood CNS:  No cranial nerve deficits.  Power equal in all extremities.   Skin: Warm and dry.  No rashes noted.  Data Reviewed: I have personally reviewed the following labs and imaging studies.    CBC: Recent Labs  Lab 08/04/21 0222 08/06/21 0209 08/09/21 0131  WBC 10.1 9.6 9.1  NEUTROABS 7.8* 7.4  --   HGB 9.8* 10.6* 10.3*  HCT 31.7* 33.8* 32.8*  MCV 96.4 95.2 95.3  PLT 179 182 664    Basic Metabolic Panel: Recent Labs  Lab 08/04/21 0222 08/06/21 0209 08/09/21 0131  NA 137 138 137  K 4.1 4.3 4.3  CL 101 99 99  CO2 _0 GLUCOSE 124* 152* 119*  BUN 32* 37* 42*  CREATININE 0.80 0.80 0.86  CALCIUM 9.0 9.4 9.2  MG 2.3 2.2 2.4  PHOS 3.6 3.5  --     GFR: Estimated Creatinine Clearance: 53.6 mL/min (by C-G formula based on SCr of 0.86 mg/dL). Liver Function Tests: Recent Labs  Lab 08/04/21 0222 08/06/21 0209  AST 14* 17  ALT 14 12  ALKPHOS 72 76  BILITOT 0.5 0.8  PROT 6.2* 6.8  ALBUMIN 2.4* 2.7*    BNP (last 3 results) Recent Labs    11/08/20 1106  PROBNP 482.0*    CBG: Recent Labs  Lab 08/09/21 0752 08/09/21 1120 08/09/21 1612 08/10/21 0007 08/10/21 0343  GLUCAP 151* 122* 122* 192* 70    Radiology Studies: No results found.   Scheduled Meds:  acetaminophen  500 mg Per  Tube TID   allopurinol  100 mg Oral Daily   amLODipine  5 mg Oral Daily   arformoterol  15 mcg Nebulization BID   budesonide (PULMICORT) nebulizer solution  0.25 mg Nebulization BID   chlorhexidine gluconate (MEDLINE KIT)  15 mL Mouth Rinse BID   Chlorhexidine Gluconate Cloth  6 each Topical Daily   feeding supplement (OSMOLITE 1.5 CAL)  355 mL Per Tube TID WC & HS   free water  150 mL Per Tube QID   guaiFENesin  10 mL Oral Q8H   lidocaine  1 application Urethral Once   mouth rinse  15 mL Mouth Rinse 10 times per day   nystatin  5 mL Oral QID   polyethylene glycol  17 g Per Tube BID   revefenacin  175 mcg Nebulization Daily   scopolamine  1 patch Transdermal Q72H  senna-docusate  1 tablet Per Tube BID   sodium chloride flush  10-40 mL Intracatheter Q12H   Continuous Infusions:     LOS: 40 days   Richarda Osmond, MD [facility] Available via Epic secure chat 7am-7pm After these hours, please refer to coverage provider listed on [email or web address] 08/10/2021, 8:05 AM

## 2021-08-11 DIAGNOSIS — J9601 Acute respiratory failure with hypoxia: Secondary | ICD-10-CM | POA: Diagnosis not present

## 2021-08-11 LAB — GLUCOSE, CAPILLARY
Glucose-Capillary: 94 mg/dL (ref 70–99)
Glucose-Capillary: 197 mg/dL — ABNORMAL HIGH (ref 70–99)
Glucose-Capillary: 140 mg/dL — ABNORMAL HIGH (ref 70–99)
Glucose-Capillary: 156 mg/dL — ABNORMAL HIGH (ref 70–99)
Glucose-Capillary: 185 mg/dL — ABNORMAL HIGH (ref 70–99)
Glucose-Capillary: 153 mg/dL — ABNORMAL HIGH (ref 70–99)

## 2021-08-11 MED ORDER — OSMOLITE 1.5 CAL PO LIQD
355.0000 mL | Freq: Three times a day (TID) | ORAL | Status: DC
  Administered 2021-08-11 – 2021-08-12 (×4): 355 mL
  Filled 2021-08-11 (×7): qty 474

## 2021-08-11 MED ORDER — OSMOLITE 1.5 CAL PO LIQD
355.0000 mL | Freq: Once | ORAL | Status: AC
  Administered 2021-08-11: 355 mL
  Filled 2021-08-11: qty 474

## 2021-08-11 NOTE — Progress Notes (Signed)
RT note. Patient found off ATC at this time. Asked patient if he would like to place ATC back on for the benefits of humidity, patient refusing at this time. Patient sat 95% and resting comfortable. RT will continue to monitor.

## 2021-08-11 NOTE — Progress Notes (Signed)
PROGRESS NOTE    Christopher Santiago  BUL:[id number] DOB: 11/12/1931 DOA: 07/01/2021 PCP: Tonia Ghent, MD    Brief Narrative:  Christopher Santiago is an 87 year old male with past medical history significant for COPD, type 2 diabetes mellitus, essential hypertension, laryngeal cancer who presented to [facility] ED on 8/9 with exertional dyspnea.  Patient also complaining of postnasal drip, hoarseness for the past 3-4 months.  Initially admitted for acute respiratory failure secondary to COPD exacerbation.  Also had been complaining of difficulty swallowing with CT maxillofacial/soft tissue neck with asymmetric soft tissue density/fullness of the base of the right aryepiglottic fold extending to the right piriform.  ENT was consulted and patient underwent direct visualization and lower endoscopy with biopsy and tracheostomy on 8/10.   Assessment & Plan:   Principal Problem:   Acute respiratory failure with hypoxia (HCC) Active Problems:   Essential hypertension   COPD with acute exacerbation (HCC)   Left renal mass   Respiratory failure, acute (HCC)   Protein-calorie malnutrition, severe   Tracheostomy status (HCC)   Tracheobronchitis   Dementia ([place])   Acute hypoxic respiratory failure COPD exacerbation Etiology likely multifactorial with component of aspiration due to dysphagia, laryngeal cancer that is recurrent and COPD exacerbation.  Patient completed 5-day course of IV steroids, 7-day course of antibiotics with cefepime with last day 8/21. --Continue trach collar, 5 L --Brovana nebs twice daily --Budesonide neb twice daily --Albuterol neb as needed --Continue trach care, oral suction  Recurrent Squamous cell carcinoma larynx CT soft tissue neck with asymmetric soft tissue density fullness at the base of the right aryepiglottic fold. ENT was consulted. Patient underwent laryngoscopy with biopsy and tracheostomy 8/11, by Dr. Fredric Dine. --Trach changed to [place] 6 Prox cuffless --Speech  therapy following --PEG tube placed by General Surgery, Dr. Bobbye Morton 8/23; on tube feeds; out patient f/u w/ [facility] --Scopolamine patch every 72 hours secretions --Outpatient follow-up with ENT, medical/radiation oncology  Left renal mass 3.0 x 2.5 cm right adrenal mass likely secondary to adenoma.   --Outpatient follow-up with urology  Right upper lobe nodule Mildly spiculated right upper lobe nodule 0.9 x 0.6 cm extending to the pleural surface.  Outpatient follow-up with oncology.  Bradycardia/sinus pause During hospitalization, patient was noted to have a 3.5-second pause.  Cardiology was consulted with no recommendations/intervention given patient asymptomatic.  Essential hypertension BP 101/56, well controlled. --Amlodipine 5 mg p.o. daily  Type 2 diabetes mellitus Hemoglobin A1c 5.8, well controlled. --Daily glucose monitoring  Severe protein calorie malnutrition: Body mass index is 24.55 kg/m. Nutrition Status: Nutrition Problem: Severe Malnutrition Etiology: chronic illness (COPD) Signs/Symptoms: severe fat depletion, severe muscle depletion Interventions: Education, Tube feeding --Dietitian following, appreciate assistance --G-tube placed by IR 8/17 --Continue tube feeds  Weakness/debility/deconditioning: PT/OT recommends CIR initially, which was declined by his insurance company.  Difficult placement given his tracheostomy. --Continue PT/OT efforts while inpatient --TOC attempting to find home health company for plan to dc home   DVT prophylaxis: Place and maintain sequential compression device Start: 07/04/21 1027   Code Status: DNR Family Communication: No family present at bedside this morning  Disposition Plan:  Level of care: Med-Surg Status is: Inpatient  Remains inpatient appropriate because:Unsafe d/c plan and Inpatient level of care appropriate due to severity of illness  Dispo: The patient is from: Home              Anticipated d/c is to: SNF  Patient currently is not medically stable to d/c.  Awaiting for TOC to arrange home health   Difficult to place patient No   Consultants:  Palliative care Radiation oncology ENT, Dr. Fredric Dine PCCM Interventional radiology General Surgery for PEG tube placement  Procedures:  MICROLARYNGOSCOPY WITH BIOPSY 8/10; Dr Fredric Dine TRACHEOSTOMY 8/10, Dr. Fredric Dine PEG tube placement, General Surgery; Dr. Bobbye Morton 8/23  Antimicrobials:  Cefepime 8/15 - 8/22 Azithromycin 8/9 - 8/10 Ceftriaxone 8/9 - 8/9, 8/15 - 8/15 Vancomycin 8/15 - 8/17 Fluconazole 8/14 - 8/14   Subjective: Patient seen examined bedside, resting comfortably.  Sitting in bedside chair.  RT present at bedside.  No specific complaints this morning.  Anticipating discharge home soon once home health agency found.  Denies headache, no dizziness, no chest pain, no palpitations, no shortness of breath more than his typical baseline, no abdominal pain, no weakness, no fatigue, no paresthesias.  No acute events overnight per nursing staff.  Objective: Vitals:   08/11/21 0308 08/11/21 0323 08/11/21 0752 08/11/21 0844  BP: (!) 146/73  136/86 122/63  Pulse: 71  83 85  Resp: (!) 22  (!) 24 19  Temp: 98 F (36.7 C)  98.1 F (36.7 C)   TempSrc: Axillary  Oral   SpO2: 100% 97% 97% 93%  Weight:      Height:        Intake/Output Summary (Last 24 hours) at 08/11/2021 1041 Last data filed at 08/11/2021 0309 Gross per 24 hour  Intake 505 ml  Output 1400 ml  Net -895 ml   Filed Weights   08/07/21 0500 08/09/21 0500 08/10/21 0500  Weight: 71 kg 69 kg 69 kg    Examination:  General exam: Appears calm and comfortable, chronically ill, cachectic in appearance Respiratory system: Decreased breath sounds bilateral bases, normal respiratory effort, no accessory muscle use, on 5 L trach collar, tracheostomy noted Cardiovascular system: S1 & S2 heard, RRR. No JVD, murmurs, rubs, gallops or clicks. No pedal  edema. Gastrointestinal system: Abdomen is nondistended, soft and nontender. No organomegaly or masses felt. Normal bowel sounds heard. Central nervous system: Alert and oriented. No focal neurological deficits. Extremities: Symmetric 5 x 5 power. Skin: No rashes, lesions or ulcers Psychiatry: Judgement and insight appear normal. Mood & affect appropriate.     Data Reviewed: I have personally reviewed following labs and imaging studies  CBC: Recent Labs  Lab 08/06/21 0209 08/09/21 0131  WBC 9.6 9.1  NEUTROABS 7.4  --   HGB 10.6* 10.3*  HCT 33.8* 32.8*  MCV 95.2 95.3  PLT 182 435   Basic Metabolic Panel: Recent Labs  Lab 08/06/21 0209 08/09/21 0131  NA 138 137  K 4.3 4.3  CL 99 99  CO2 28 29  GLUCOSE 152* 119*  BUN 37* 42*  CREATININE 0.80 0.86  CALCIUM 9.4 9.2  MG 2.2 2.4  PHOS 3.5  --    GFR: Estimated Creatinine Clearance: 53.6 mL/min (by C-G formula based on SCr of 0.86 mg/dL). Liver Function Tests: Recent Labs  Lab 08/06/21 0209  AST 17  ALT 12  ALKPHOS 76  BILITOT 0.8  PROT 6.8  ALBUMIN 2.7*   No results for input(s): LIPASE, AMYLASE in the last 168 hours. No results for input(s): AMMONIA in the last 168 hours. Coagulation Profile: No results for input(s): INR, PROTIME in the last 168 hours. Cardiac Enzymes: No results for input(s): CKTOTAL, CKMB, CKMBINDEX, TROPONINI in the last 168 hours. BNP (last 3 results) Recent Labs    11/08/20 1106  PROBNP 482.0*   HbA1C: No results for input(s): HGBA1C in the last 72 hours. CBG: Recent Labs  Lab 08/10/21 1611 08/10/21 2044 08/10/21 2335 08/11/21 0305 08/11/21 0751  GLUCAP 125* 83 188* 94 197*   Lipid Profile: No results for input(s): CHOL, HDL, LDLCALC, TRIG, CHOLHDL, LDLDIRECT in the last 72 hours. Thyroid Function Tests: No results for input(s): TSH, T4TOTAL, FREET4, T3FREE, THYROIDAB in the last 72 hours. Anemia Panel: No results for input(s): VITAMINB12, FOLATE, FERRITIN, TIBC, IRON,  RETICCTPCT in the last 72 hours. Sepsis Labs: No results for input(s): PROCALCITON, LATICACIDVEN in the last 168 hours.  No results found for this or any previous visit (from the past 240 hour(s)).       Radiology Studies: No results found.      Scheduled Meds:  acetaminophen  500 mg Per Tube TID   allopurinol  100 mg Oral Daily   amLODipine  5 mg Oral Daily   arformoterol  15 mcg Nebulization BID   budesonide (PULMICORT) nebulizer solution  0.25 mg Nebulization BID   chlorhexidine gluconate (MEDLINE KIT)  15 mL Mouth Rinse BID   Chlorhexidine Gluconate Cloth  6 each Topical Daily   feeding supplement (OSMOLITE 1.5 CAL)  355 mL Per Tube TID WC & HS   free water  150 mL Per Tube QID   guaiFENesin  10 mL Oral Q8H   lidocaine  1 application Urethral Once   mouth rinse  15 mL Mouth Rinse BID   nystatin  5 mL Oral QID   polyethylene glycol  17 g Per Tube BID   revefenacin  175 mcg Nebulization Daily   scopolamine  1 patch Transdermal Q72H   senna-docusate  1 tablet Per Tube BID   sodium chloride flush  10-40 mL Intracatheter Q12H   Continuous Infusions:     LOS: 41 days    Time spent: 39 minutes spent on chart review, discussion with nursing staff, consultants, updating family and interview/physical exam; more than 50% of that time was spent in counseling and/or coordination of care.    Christopher Santiago J [place] DO [facility] Available via Epic secure chat 7am-7pm After these hours, please refer to coverage provider listed on [email or web address] 08/11/2021, 10:41 AM

## 2021-08-11 NOTE — Progress Notes (Signed)
Physical Therapy Treatment Patient Details Name: Christopher Santiago MRN: [id number] DOB: March 19, 1931 Today's Date: 08/11/2021   History of Present Illness 87 y/o male admitted 8/9 secondary to worsening SOB, especially with exertion. Found to have acute respiratory failure with hypoxia. Imaging showed fullness of the right aryepiglottic fold extending into the right pyriform and L renal mass. Pt found to have left renal mass 8/9. Pt had Flexible laryngoscopy on 8/10 with trach. PEG 8/23. Acute course complicated by metabolic encephalopathy. PMHx:vocal cord cancer, COPD, CAD, DM, HTN, and R THA, gout, PVD, BPH.    PT Comments    Pt received in bed, agreeable to participation in therapy. He required supervision bed mobility, transfers, and ambulation 160' with rollator. Mobilized on RA with SpO2 > 90%. 2/4 DOE. Pt in recliner with feet elevated at end of session.    Recommendations for follow up therapy are one component of a multi-disciplinary discharge planning process, led by the attending physician.  Recommendations may be updated based on patient status, additional functional criteria and insurance authorization.  Follow Up Recommendations  Home health PT;Supervision for mobility/OOB     Equipment Recommendations  Hospital bed    Recommendations for Other Services       Precautions / Restrictions Precautions Precautions: Fall;Other (comment) Precaution Comments: trach, peg, monitor BP, incontinent     Mobility  Bed Mobility Overal bed mobility: Needs Assistance Bed Mobility: Supine to Sit     Supine to sit: Supervision;HOB elevated     General bed mobility comments: Supervision for lines/tubes    Transfers Overall transfer level: Needs assistance Equipment used: 4-wheeled walker Transfers: Sit to/from Stand Sit to Stand: Supervision         General transfer comment: supervision for safety/lines  Ambulation/Gait Ambulation/Gait assistance: Supervision Gait Distance  (Feet): 160 Feet Assistive device: 4-wheeled walker Gait Pattern/deviations: Step-through pattern;Decreased stride length;Drifts right/left Gait velocity: decreased Gait velocity interpretation: 1.31 - 2.62 ft/sec, indicative of limited community ambulator General Gait Details: assist for safety. Easily distracted by staff and visitors in hallway. 2/4 DOE on [employer] Rankin (Stroke Patients Only)       Balance Overall balance assessment: Needs assistance Sitting-balance support: Feet supported;No upper extremity supported Sitting balance-Leahy Scale: Good     Standing balance support: During functional activity;No upper extremity supported;Bilateral upper extremity supported Standing balance-Leahy Scale: Fair Standing balance comment: static stand without support, RW for amb                            Cognition Arousal/Alertness: Awake/alert Behavior During Therapy: WFL for tasks assessed/performed Overall Cognitive Status: Within Functional Limits for tasks assessed                                        Exercises      General Comments General comments (skin integrity, edema, etc.): SpO2 > 90% on RA. 2/4 DOE      Pertinent Vitals/Pain Pain Assessment: No/denies pain    Home Living                      Prior Function            PT Goals (current goals can now be found in the care plan  section) Acute Rehab PT Goals Patient Stated Goal: home Progress towards PT goals: Progressing toward goals    Frequency    Min 3X/week      PT Plan Current plan remains appropriate    Co-evaluation              AM-PAC PT "6 Clicks" Mobility   Outcome Measure  Help needed turning from your back to your side while in a flat bed without using bedrails?: None Help needed moving from lying on your back to sitting on the side of a flat bed without using bedrails?: A  Little Help needed moving to and from a bed to a chair (including a wheelchair)?: A Little Help needed standing up from a chair using your arms (e.g., wheelchair or bedside chair)?: A Little Help needed to walk in hospital room?: A Little Help needed climbing 3-5 steps with a railing? : A Little 6 Click Score: 19    End of Session Equipment Utilized During Treatment: Gait belt Activity Tolerance: Patient tolerated treatment well Patient left: in chair;with call bell/phone within reach Nurse Communication: Mobility status PT Visit Diagnosis: Other abnormalities of gait and mobility (R26.89);Muscle weakness (generalized) (M62.81)     Time: 3794-3276 PT Time Calculation (min) (ACUTE ONLY): 16 min  Charges:  $Gait Training: 8-22 mins                     Lorrin Goodell, PT  Office # [phone] Pager [phone]    Lorriane Shire 08/11/2021, 11:56 AM

## 2021-08-11 NOTE — Progress Notes (Signed)
RT NOTES: Educated patient's daughters on trach care and inner cannula change. Daughters were able to perform with no issues.

## 2021-08-11 NOTE — TOC Progression Note (Signed)
Transition of Care [facility]) - Progression Note    Patient Details  Name: Christopher Santiago MRN: [id number] Date of Birth: [birth year]  [facility] CM/SW Contact  Christopher Chars, LCSW Phone Number: 08/11/2021, 4:09 PM  Clinical Narrative:   CSW spoke with daughter Christopher Santiago and pt in room.  2nd trach care training just completed with respiratory, went well.  CSW went over some of the other arrangements with DME, daughter had questions about suction machine, 3n1, nebulizer.  Also asking about respiratory follow up in the home.  CSW to check on these items.  Discussed potential DC tomorrow, daughter states they are definitely "not Santiago".  CSW discussed this with her, bed has still not been delivered, reassured her that we will make sure that all needed supplies/DME are in place prior to DC.  Daughter still maintains they need several more days.  CSW engaged in further discussion of what is needed, asked daughter to allow DME to be delivered and then let CSW know what else is needed.  1440: CSW received call from pt wife, Christopher Santiago.  She stated that she had heard that DC tomorrow was being discussed and wanted CSW to know they are not Santiago and need several more days before they will be Santiago.  She reported that the hospital bed still has not been delivered and they need to time set things up.  She said bed was supposed to be delivered around noon and she got a phone message saying they needed to reschedule.  CSW assured her that we will make sure that all equipment is in place and agreed to check with [employer] as bed was supposed to be delivered Friday or over the weekend.  Wife became more upset, stated we were "trying to dump" patient back home, stated she was going to call the newspaper about this, and then abruptly said she had to go and hung up.  CSW spoke with [employer], both Christopher Santiago and Christopher Santiago. Attempt was made to deliver the bed as scheduled.  Driver found car in the driveway, front door open, but no  answer when he knocked.  They will make another attempt today.    3n1 ordered.          Expected Discharge Plan: [place] Barriers to Discharge: Equipment Delay  Expected Discharge Plan and Services Expected Discharge Plan: [place]   Discharge Planning Services: CM Consult Post Acute Care Choice: [place] Living arrangements for the past 2 months: Single Family Home                 DME Arranged: Nebulizer machine, Suction, Oxygen, Trach supplies, Tube feeding DME Agency: [employer] Date DME Agency Contacted: 08/10/21 Time DME Agency Contacted: 68 Representative spoke with at DME Agency: Christopher Santiago HH Arranged: RN, PT, OT, Nurse's Aide, Speech Therapy, Respirator Therapy HH Agency: [place] Date [place]: 08/10/21 Time [place]: 1600 Representative spoke with at [place]: Christopher ([place]) Interventions    Readmission Risk Interventions No flowsheet data found.

## 2021-08-11 NOTE — Progress Notes (Signed)
Respiratory Care/ Programmer, systems Family in today for education and training with trach care for possible discharge to home with Christopher Santiago.  Christopher Santiago and her two daughters Christopher Santiago and Christopher Santiago were very pleasant and eager to learn.  Spent an hour with the family.  Was able to review and have hands on / return demonstration from each daughter on suctioning/ trach collar change, trach cleaning ,change of drain gauze and replacing inner cannula.  Each daughter demonstration good technique and skills.  I am pleased to say the both di well today and will do well at home with father.  Was able to discuss  the use of ambu bag and discussed importance and use of  the obturator.  Both daughters feel comfortable with trach education received. Family at this point has expressed a desire to have their love one to  be seen at the trach clinic and has already set an appointment.

## 2021-08-12 DIAGNOSIS — J9601 Acute respiratory failure with hypoxia: Secondary | ICD-10-CM | POA: Diagnosis not present

## 2021-08-12 LAB — GLUCOSE, CAPILLARY
Glucose-Capillary: 93 mg/dL (ref 70–99)
Glucose-Capillary: 95 mg/dL (ref 70–99)
Glucose-Capillary: 116 mg/dL — ABNORMAL HIGH (ref 70–99)

## 2021-08-12 MED ORDER — FUROSEMIDE 40 MG PO TABS: 40.0000 mg | ORAL_TABLET | Freq: Every day | ORAL | 0 refills | Status: AC

## 2021-08-12 MED ORDER — GUAIFENESIN 100 MG/5ML PO SOLN: 10.0000 mL | Freq: Three times a day (TID) | ORAL | 0 refills | Status: AC

## 2021-08-12 MED ORDER — ARFORMOTEROL TARTRATE 15 MCG/2ML IN NEBU: 15.0000 ug | INHALATION_SOLUTION | Freq: Two times a day (BID) | RESPIRATORY_TRACT | 0 refills | Status: DC

## 2021-08-12 MED ORDER — PANTOPRAZOLE SODIUM 40 MG PO PACK: 40.0000 mg | PACK | Freq: Two times a day (BID) | ORAL | 0 refills | Status: AC

## 2021-08-12 MED ORDER — AMLODIPINE BESYLATE 5 MG PO TABS: 5.0000 mg | ORAL_TABLET | Freq: Every day | ORAL | 2 refills | Status: AC

## 2021-08-12 MED ORDER — FREE WATER: 150.0000 mL | Freq: Four times a day (QID) | Status: AC

## 2021-08-12 MED ORDER — ALLOPURINOL 100 MG PO TABS: 100.0000 mg | ORAL_TABLET | Freq: Every day | ORAL | 1 refills | Status: AC

## 2021-08-12 MED ORDER — SENNOSIDES-DOCUSATE SODIUM 8.6-50 MG PO TABS: 1.0000 | ORAL_TABLET | Freq: Two times a day (BID) | ORAL | 0 refills | Status: AC

## 2021-08-12 MED ORDER — OSMOLITE 1.5 CAL PO LIQD: 355.0000 mL | Freq: Three times a day (TID) | ORAL | 3 refills | Status: AC

## 2021-08-12 MED ORDER — REVEFENACIN 175 MCG/3ML IN SOLN: 175.0000 ug | Freq: Every day | RESPIRATORY_TRACT | 0 refills | Status: DC

## 2021-08-12 MED ORDER — BUDESONIDE 0.25 MG/2ML IN SUSP: 0.2500 mg | Freq: Two times a day (BID) | RESPIRATORY_TRACT | 0 refills | Status: DC

## 2021-08-12 MED ORDER — CITALOPRAM HYDROBROMIDE 40 MG PO TABS: 60.0000 mg | ORAL_TABLET | Freq: Every day | ORAL | 1 refills | Status: AC

## 2021-08-12 MED ORDER — SCOPOLAMINE 1 MG/3DAYS TD PT72: 1.0000 | MEDICATED_PATCH | TRANSDERMAL | 12 refills | Status: AC

## 2021-08-12 MED ORDER — ASPIRIN 81 MG PO CHEW
81.0000 mg | CHEWABLE_TABLET | Freq: Every day | ORAL | 0 refills | Status: AC
Start: 2021-08-12 — End: 2021-11-10

## 2021-08-12 NOTE — Discharge Summary (Signed)
Physician Discharge Summary  Christopher Santiago JOI:[id number] DOB: 10-Feb-1931 DOA: 07/01/2021  PCP: Tonia Ghent, MD  Admit date: 07/01/2021 Discharge date: 08/12/2021  Admitted From: Home  Disposition: Home   Recommendations for Outpatient Follow-up:  Follow up with PCP in 1-2 weeks; scheduled with Dr. Damita Dunnings on 08/15/2021 Follow-up with ENT, Dr. Fredric Dine in 2 weeks Follow-up with [place] [facility] on 08/27/2021 as scheduled Will need outpatient referral to urology outpatient for renal mass  Home Health: PT/OT/RN/aide/SLP Equipment/Devices: Nebulizer machine, suction, trach supplies, tube feeding, hospital bed  Discharge Condition: Stable CODE STATUS: DNR Diet recommendation: Tube feeds. 1.5 cartons (381ml) Osmolite 1.5 cal QID, flush with 56ml free water before and after each bolus with 14ml additional free water flush QID  History of present illness:  Christopher Santiago is an 87 year old male with past medical history significant for COPD, type 2 diabetes mellitus, essential hypertension, laryngeal cancer who presented to [facility] ED on 8/9 with exertional dyspnea.  Patient also complaining of postnasal drip, hoarseness for the past 3-4 months.  Initially admitted for acute respiratory failure secondary to COPD exacerbation.  Also had been complaining of difficulty swallowing with CT maxillofacial/soft tissue neck with asymmetric soft tissue density/fullness of the base of the right aryepiglottic fold extending to the right piriform.  ENT was consulted and patient underwent direct visualization and lower endoscopy with biopsy and tracheostomy on 8/10.  Hospital course:  Acute hypoxic respiratory failure COPD exacerbation Etiology likely multifactorial with component of aspiration due to dysphagia, laryngeal cancer that is recurrent and COPD exacerbation.  Patient completed 5-day course of IV steroids, 7-day course of antibiotics with cefepime with last day 8/21. Continue Brovana nebs twice  daily, Budesonide neb twice daily, Yupelri nebs daily and Albuterol neb as needed. Continue trach care, oral suction   Recurrent Squamous cell carcinoma larynx CT soft tissue neck with asymmetric soft tissue density fullness at the base of the right aryepiglottic fold. ENT was consulted. Patient underwent laryngoscopy with biopsy and tracheostomy 8/11, by Dr. Fredric Dine. PEG tube placed by General Surgery, Dr. Bobbye Morton 8/23; on tube feeds; out patient f/u w/ [facility]. Scopolamine patch every 72 hours secretions. Outpatient follow-up with ENT, medical/radiation oncology   Left renal mass 3.0 x 2.5 cm right adrenal mass likely secondary to adenoma.  Outpatient follow-up with urology   Right upper lobe nodule Mildly spiculated right upper lobe nodule 0.9 x 0.6 cm extending to the pleural surface.  Outpatient follow-up with oncology.   Bradycardia/sinus pause During hospitalization, patient was noted to have a 3.5-second pause.  Cardiology was consulted with no recommendations/intervention given patient asymptomatic.   Essential hypertension Amlodipine 5 mg p.o. daily, furosemide.    Type 2 diabetes mellitus Hemoglobin A1c 5.8, well controlled.   Severe protein calorie malnutrition: Body mass index is 24.55 kg/m. Nutrition Status: Nutrition Problem: Severe Malnutrition Etiology: chronic illness (COPD) Signs/Symptoms: severe fat depletion, severe muscle depletion Interventions: Education, Tube feeding 1.5 cartons (395ml) Osmolite 1.5 cal QID, flush with 8ml free water before and after each bolus with 1105ml additional free water flush QID   Weakness/debility/deconditioning: PT/OT recommends CIR initially, which was declined by his insurance company.  Difficult placement given his tracheostomy.  Discharge Diagnoses:  Active Problems:   Essential hypertension   COPD with acute exacerbation (HCC)   Left renal mass   Protein-calorie malnutrition, severe   Tracheostomy status ([place])   Dementia  [facility])    Discharge Instructions  Discharge Instructions     Call MD for:  difficulty breathing, headache or visual disturbances   Complete by: As directed    Call MD for:  extreme fatigue   Complete by: As directed    Call MD for:  persistant dizziness or light-headedness   Complete by: As directed    Call MD for:  persistant nausea and vomiting   Complete by: As directed    Call MD for:  severe uncontrolled pain   Complete by: As directed    Call MD for:  temperature >100.4   Complete by: As directed    Diet - low sodium heart healthy   Complete by: As directed    Increase activity slowly   Complete by: As directed    No wound care   Complete by: As directed       Allergies as of 08/12/2021       Reactions   Tessalon [benzonatate] Other (See Comments)   Ineffective, nasal congestion        Medication List     STOP taking these medications    aspirin EC 81 MG tablet Replaced by: aspirin 81 MG chewable tablet   Cholecalciferol 50 MCG (2000 UT) Caps   DOK 100 MG capsule Generic drug: docusate sodium   guaiFENesin 600 MG 12 hr tablet Commonly known as: MUCINEX Replaced by: guaiFENesin 100 MG/5ML Soln   pantoprazole 40 MG tablet Commonly known as: PROTONIX Replaced by: pantoprazole sodium 40 mg       TAKE these medications    albuterol 108 (90 Base) MCG/ACT inhaler Commonly known as: Ventolin HFA Inhale 1-2 puffs into the lungs every 6 (six) hours as needed for wheezing or shortness of breath (okay to fill with albuterol/proair/ventolin).   allopurinol 100 MG tablet Commonly known as: ZYLOPRIM Place 1 tablet (100 mg total) into feeding tube daily. What changed: how to take this   amLODipine 5 MG tablet Commonly known as: NORVASC Place 1 tablet (5 mg total) into feeding tube daily. Start taking on: August 13, 2021   arformoterol 15 MCG/2ML Nebu Commonly known as: BROVANA Take 2 mLs (15 mcg total) by nebulization 2 (two) times daily.    aspirin 81 MG chewable tablet Commonly known as: Aspirin Childrens Place 1 tablet (81 mg total) into feeding tube daily. Replaces: aspirin EC 81 MG tablet   budesonide 0.25 MG/2ML nebulizer solution Commonly known as: PULMICORT Take 2 mLs (0.25 mg total) by nebulization 2 (two) times daily.   citalopram 40 MG tablet Commonly known as: CELEXA Place 1.5 tablets (60 mg total) into feeding tube daily. What changed: See the new instructions.   feeding supplement (OSMOLITE 1.5 CAL) Liqd Place 355 mLs into feeding tube 4 (four) times daily -  with meals and at bedtime.   free water Soln Place 150 mLs into feeding tube 4 (four) times daily.   furosemide 40 MG tablet Commonly known as: LASIX Place 1 tablet (40 mg total) into feeding tube daily. What changed: See the new instructions.   guaiFENesin 100 MG/5ML Soln Commonly known as: ROBITUSSIN Take 10 mLs (200 mg total) by mouth every 8 (eight) hours. Replaces: guaiFENesin 600 MG 12 hr tablet   metoprolol succinate 25 MG 24 hr tablet Commonly known as: TOPROL-XL TAKE 1/2 TABLETS BY MOUTH ONCE DAILY What changed: See the new instructions.   pantoprazole sodium 40 mg Commonly known as: PROTONIX Place 40 mg into feeding tube 2 (two) times daily. Replaces: pantoprazole 40 MG tablet   revefenacin 175 MCG/3ML nebulizer solution Commonly known as: YUPELRI Take  3 mLs (175 mcg total) by nebulization daily. Start taking on: August 13, 2021   scopolamine 1 MG/3DAYS Commonly known as: TRANSDERM-SCOP Place 1 patch (1.5 mg total) onto the skin every 3 (three) days.   senna-docusate 8.6-50 MG tablet Commonly known as: Senokot-S Place 1 tablet into feeding tube 2 (two) times daily.               Durable Medical Equipment  (From admission, onward)           Start     Ordered   08/11/21 1553  For home use only DME Suction  Once       Question:  Suction  Answer:  Trach   08/11/21 1552   08/11/21 1452  For home use only  DME 3 n 1  Once        08/11/21 1451   08/10/21 1454  For home use only DME Nebulizer machine  Once       Question Answer Comment  Patient needs a nebulizer to treat with the following condition COPD (chronic obstructive pulmonary disease) ([place])   Length of Need Lifetime      08/10/21 1453   08/10/21 1446  For home use only DME Tube feeding  Once       Comments: Bolus TF via PEG: -1.5 cartons (390ml) Osmolite 1.5 cal QID, flush with 62ml free water before and after each bolus -149ml additional free water flush QID   Provides 2130 kcal, 89 grams of protein, and 1086 ml of H2O (2166 ml free water total with flushes) Meets 100% estimated nutrition needs   08/10/21 1445   08/07/21 1500  For home use only DME Hospital bed  Once       Question Answer Comment  Length of Need Lifetime   The above medical condition requires: Patient requires the ability to reposition frequently   Bed type Semi-electric      08/07/21 1500            Follow-up Information     Tonia Ghent, MD. Go on 08/15/2021.   Specialty: Family Medicine Why: Please attend your hospital discharge appt with Dr Damita Dunnings on Friday, 08/15/21, at 1230.  Please note new location for this appt: [place] Office: [employer] in [place]. Contact information: [place] [address] [place], [place], [place]. Schedule an appointment as soon as possible for a visit in 2 week(s).   Specialty: Otolaryngology Contact information: [place] [address] [phone]         [facility]. Go on 08/27/2021.   Why: Please attend your trach clinic appointment on Wednesday, 06/27/21, at 100pm.  Please come to the main entrance at [facility] for this appointment. Contact information: [place], [place] Follow up.   Specialty: Home Health Services Why: For home health services. They  can start services on Wednesday 9/21. For any questions please contact Ramond Marrow at [phone] Contact information: [place] [address] [phone]         [facility] Follow up.   Why: This company, now called [employer], will provide trach supplies and tube feeds.  Please contact them with any problems. Contact information: [address] [place] [address] [phone]         [facility].  Schedule an appointment as soon as possible for a visit in 2 week(s).   Why: Renal Mass Contact information: [place] [phone]               Allergies  Allergen Reactions   Tessalon [Benzonatate] Other (See Comments)    Ineffective, nasal congestion    Consultations: Palliative care Radiation oncology ENT, Dr. Fredric Dine PCCM Interventional radiology General Surgery for PEG tube placement   Procedures/Studies: DG CHEST PORT 1 VIEW  Result Date: 08/01/2021 CLINICAL DATA:  Shortness of breath, history of COPD EXAM: PORTABLE CHEST 1 VIEW COMPARISON:  Chest radiograph 07/29/2021 FINDINGS: The tracheostomy tube is stable The cardiomediastinal silhouette is stable, with unchanged calcified atherosclerotic plaque of the thoracic aorta. Linear opacities in the left base likely reflect subsegmental atelectasis. There is no new focal airspace disease. The nodular density in the right upper lobe is unchanged, consistent with a calcified granuloma. There is no significant pleural effusion or pneumothorax. There is no acute osseous abnormality. IMPRESSION: Stable chest with no new focal airspace disease. Electronically Signed   By: Valetta Mole M.D.   On: 08/01/2021 09:30   DG CHEST PORT 1 VIEW  Result Date: 07/29/2021 CLINICAL DATA:  Shortness of breath. EXAM: PORTABLE CHEST 1 VIEW COMPARISON:  07/06/2021 FINDINGS: 1047 hours. Tracheostomy tube again noted. No pulmonary edema.  Tiny nodular density right upper lobe likely calcified granuloma, stable. There is some atelectasis or infiltrate at the left base. No pneumothorax. Bones are diffusely demineralized. Telemetry leads overlie the chest. IMPRESSION: 1. Question atelectasis or infiltrate at the left base. Otherwise no acute cardiopulmonary findings. 2. Stable right upper lobe calcified granuloma. Electronically Signed   By: Misty Stanley M.D.   On: 07/29/2021 12:47   DG Abd Portable 1V  Result Date: 07/14/2021 CLINICAL DATA:  Feeding tube placement. EXAM: PORTABLE ABDOMEN - 1 VIEW COMPARISON:  07/11/2021 FINDINGS: Feeding tube tip is in the gastric fundus. Upper abdomen demonstrates nonspecific bowel gas pattern. Interstitial changes noted in the lung bases with left base linear atelectasis or scarring. IMPRESSION: Feeding tube tip is in the stomach. Electronically Signed   By: Misty Stanley M.D.   On: 07/14/2021 13:34   ECHOCARDIOGRAM COMPLETE  Result Date: 07/14/2021    ECHOCARDIOGRAM REPORT   Patient Name:   CATRELL MORRONE Date of Exam: 07/14/2021 Medical Rec #:  [id number]      Height:       66.0 in Accession #:    [id number]     Weight:       155.6 lb Date of Birth:  [birth year]      BSA:          1.798 m Patient Age:    37 years       BP:           186/52 mmHg Patient Gender: M              HR:           58 bpm. Exam Location:  Inpatient Procedure: 2D Echo, Cardiac Doppler, Color Doppler and Intracardiac            Opacification Agent Indications:     Abnormal EKG  History:         Patient has prior history of Echocardiogram examinations, most                  recent 08/10/2019. COPD, Signs/Symptoms:Dyspnea; Risk  Factors:Diabetes, Hypertension and Former Smoker. Resp.                  failure, tracheostomy.  Sonographer:     Dustin Flock RDCS Referring Phys:  [place] Diagnosing Phys: Oswaldo Milian MD  Sonographer Comments: Technically difficult study due to poor echo windows,  suboptimal parasternal window, suboptimal apical window and suboptimal subcostal window. Image acquisition challenging due to COPD. IMPRESSIONS  1. Technically difficult study, very poor visualization of left ventricle even after contrast administration. Left ventricular ejection fraction, by estimation, is 55 to 60%. The left ventricle has grossly normal function but very poorly visualized. Left ventricular endocardial border not optimally defined to evaluate regional wall motion. Left ventricular diastolic parameters are indeterminate.  2. Right ventricular systolic function is normal. The right ventricular size is mildly enlarged. Tricuspid regurgitation signal is inadequate for assessing PA pressure.  3. Right atrial size was moderately dilated.  4. A small pericardial effusion is present. The pericardial effusion is anterior to the right ventricle. Presence of pericardial fat pad.  5. The mitral valve is grossly normal. No evidence of mitral valve regurgitation.  6. The aortic valve was not well visualized. Aortic valve regurgitation is not visualized. No aortic stenosis is present.  7. The inferior vena cava is normal in size with greater than 50% respiratory variability, suggesting right atrial pressure of 3 mmHg. FINDINGS  Left Ventricle: Left ventricular ejection fraction, by estimation, is 55 to 60%. The left ventricle has normal function. Left ventricular endocardial border not optimally defined to evaluate regional wall motion. Definity contrast agent was given IV to delineate the left ventricular endocardial borders. The left ventricular internal cavity size was not well visualized. There is not well visualized left ventricular hypertrophy. Left ventricular diastolic parameters are indeterminate. Right Ventricle: The right ventricular size is mildly enlarged. Right vetricular wall thickness was not well visualized. Right ventricular systolic function is normal. Tricuspid regurgitation signal is  inadequate for assessing PA pressure. Left Atrium: Left atrial size was normal in size. Right Atrium: Right atrial size was moderately dilated. Pericardium: A small pericardial effusion is present. The pericardial effusion is anterior to the right ventricle. Presence of pericardial fat pad. Mitral Valve: The mitral valve is grossly normal. No evidence of mitral valve regurgitation. Tricuspid Valve: The tricuspid valve is normal in structure. Tricuspid valve regurgitation is trivial. Aortic Valve: The aortic valve was not well visualized. Aortic valve regurgitation is not visualized. No aortic stenosis is present. Pulmonic Valve: The pulmonic valve was not well visualized. Pulmonic valve regurgitation is not visualized. Aorta: The aortic root was not well visualized. Venous: The inferior vena cava is normal in size with greater than 50% respiratory variability, suggesting right atrial pressure of 3 mmHg. IAS/Shunts: The interatrial septum was not well visualized.   Diastology LV e' medial:    4.03 cm/s LV E/e' medial:  9.7 LV e' lateral:   4.79 cm/s LV E/e' lateral: 8.1  RIGHT VENTRICLE RV Basal diam:  4.30 cm RV S prime:     9.14 cm/s TAPSE (M-mode): 1.8 cm LEFT ATRIUM           Index       RIGHT ATRIUM           Index LA Vol (A4C): 51.7 ml 28.76 ml/m RA Area:     23.40 cm  RA Volume:   73.80 ml  41.05 ml/m  AORTIC VALVE LVOT Vmax:   64.00 cm/s LVOT Vmean:  40.600 cm/s LVOT VTI:    0.131 m MITRAL VALVE MV Area (PHT): 3.15 cm    SHUNTS MV Decel Time: 241 msec    Systemic VTI: 0.13 m MV E velocity: 39.00 cm/s MV A velocity: 48.00 cm/s MV E/A ratio:  0.81 Oswaldo Milian MD Electronically signed by Oswaldo Milian MD Signature Date/Time: 07/14/2021/11:52:38 AM    Final (Updated)      Subjective: Patient seen examined at bedside, resting comfortably.  Sitting in bedside chair.  Working with PT this morning.  Patient states ready for discharge home.  Social work reports  all equipment was delivered last night to patient's home.  No other questions or concerns at this time.  Denies headache, no chest pain, no palpitations, no shortness of breath, no abdominal pain, no weakness, no fatigue, no paresthesias.  No acute events overnight per nursing staff.  Discharge Exam: Vitals:   08/12/21 0818 08/12/21 1131  BP: (!) 133/36 (!) 133/36  Pulse: 61 80  Resp: 18 17  Temp:    SpO2: 100% 100%   Vitals:   08/11/21 2318 08/12/21 0336 08/12/21 0818 08/12/21 1131  BP:  (!) 160/75 (!) 133/36 (!) 133/36  Pulse:  68 61 80  Resp:  19 18 17   Temp:  98 F (36.7 C)    TempSrc:  Axillary    SpO2: 97% 97% 100% 100%  Weight:      Height:        General: Pt is alert, awake, not in acute distress, chronically ill/cachectic in appearance Cardiovascular: RRR, S1/S2 +, no rubs, no gallops Respiratory: CTA bilaterally, no wheezing, no rhonchi, on room air, tracheostomy noted Abdominal: Soft, NT, ND, bowel sounds +, PEG tube noted Extremities: no edema, no cyanosis    The results of significant diagnostics from this hospitalization (including imaging, microbiology, ancillary and laboratory) are listed below for reference.     Microbiology: No results found for this or any previous visit (from the past 240 hour(s)).   Labs: BNP (last 3 results) Recent Labs    07/01/21 1729  BNP 242.3*   Basic Metabolic Panel: Recent Labs  Lab 08/06/21 0209 08/09/21 0131  NA 138 137  K 4.3 4.3  CL 99 99  CO2 28 29  GLUCOSE 152* 119*  BUN 37* 42*  CREATININE 0.80 0.86  CALCIUM 9.4 9.2  MG 2.2 2.4  PHOS 3.5  --    Liver Function Tests: Recent Labs  Lab 08/06/21 0209  AST 17  ALT 12  ALKPHOS 76  BILITOT 0.8  PROT 6.8  ALBUMIN 2.7*   No results for input(s): LIPASE, AMYLASE in the last 168 hours. No results for input(s): AMMONIA in the last 168 hours. CBC: Recent Labs  Lab 08/06/21 0209 08/09/21 0131  WBC 9.6 9.1  NEUTROABS 7.4  --   HGB 10.6* 10.3*  HCT  33.8* 32.8*  MCV 95.2 95.3  PLT 182 158   Cardiac Enzymes: No results for input(s): CKTOTAL, CKMB, CKMBINDEX, TROPONINI in the last 168 hours. BNP: Invalid input(s): POCBNP CBG: Recent Labs  Lab 08/11/21 1542 08/11/21 2010 08/11/21 2322 08/12/21 0332 08/12/21 0838  GLUCAP 156* 185* 153* 93 95   D-Dimer No results for input(s): DDIMER in the last 72 hours. Hgb A1c No results for input(s): HGBA1C in the last 72 hours. Lipid Profile No results for input(s): CHOL, HDL, LDLCALC, TRIG, CHOLHDL, LDLDIRECT  in the last 72 hours. Thyroid function studies No results for input(s): TSH, T4TOTAL, T3FREE, THYROIDAB in the last 72 hours.  Invalid input(s): FREET3 Anemia work up No results for input(s): VITAMINB12, FOLATE, FERRITIN, TIBC, IRON, RETICCTPCT in the last 72 hours. Urinalysis    Component Value Date/Time   COLORURINE YELLOW 08/05/2021 1609   APPEARANCEUR TURBID (A) 08/05/2021 1609   LABSPEC 1.021 08/05/2021 1609   PHURINE 6.0 08/05/2021 1609   GLUCOSEU NEGATIVE 08/05/2021 1609   HGBUR LARGE (A) 08/05/2021 1609   BILIRUBINUR NEGATIVE 08/05/2021 1609   KETONESUR NEGATIVE 08/05/2021 1609   PROTEINUR 100 (A) 08/05/2021 1609   UROBILINOGEN 0.2 08/31/2009 1233   NITRITE NEGATIVE 08/05/2021 1609   LEUKOCYTESUR LARGE (A) 08/05/2021 1609   Sepsis Labs Invalid input(s): PROCALCITONIN,  WBC,  LACTICIDVEN Microbiology No results found for this or any previous visit (from the past 240 hour(s)).   Time coordinating discharge: Over 30 minutes  SIGNED:   Donnamarie Poag [place] DO  [facility] 08/12/2021, 11:32 AM

## 2021-08-12 NOTE — Progress Notes (Addendum)
Occupational Therapy Treatment Patient Details Name: Christopher Santiago MRN: [id number] DOB: 08-02-1931 Today's Date: 08/12/2021   History of present illness 87 y/o male admitted 8/9 secondary to worsening SOB, especially with exertion. Found to have acute respiratory failure with hypoxia. Imaging showed fullness of the right aryepiglottic fold extending into the right pyriform and L renal mass. Pt found to have left renal mass 8/9. Pt had Flexible laryngoscopy on 8/10 with trach. PEG 8/23. Acute course complicated by metabolic encephalopathy. PMHx:vocal cord cancer, COPD, CAD, DM, HTN, and R THA, gout, PVD, BPH.   OT comments  Patient seated in recliner when therapist entered. Patient transferred to 3n1 and was able to perform toilet hygiene while standing.  Patient stood at sink for grooming, UB bathing, and complete LB bathing with min guard and patient requiring 1 rest break.  Patient returned to supine.  Patient is expected to return home this week per patient.     Recommendations for follow up therapy are one component of a multi-disciplinary discharge planning process, led by the attending physician.  Recommendations may be updated based on patient status, additional functional criteria and insurance authorization.    Follow Up Recommendations  Home health OT;Supervision/Assistance - 24 hour    Equipment Recommendations  3 in 1 bedside commode    Recommendations for Other Services      Precautions / Restrictions Precautions Precautions: Fall;Other (comment) Precaution Comments: trach, peg, monitor BP, incontinent Restrictions Weight Bearing Restrictions: No       Mobility Bed Mobility Overal bed mobility: Needs Assistance Bed Mobility: Supine to Sit     Supine to sit: Supervision;HOB elevated Sit to supine: Supervision;HOB elevated   General bed mobility comments: supervision for lines and HOB up    Transfers Overall transfer level: Needs assistance Equipment used:  Rolling walker (2 wheeled) Transfers: Sit to/from Stand Sit to Stand: Min guard Stand pivot transfers: Min guard       General transfer comment: stood from recliner, BSC, and eob    Balance Overall balance assessment: Needs assistance Sitting-balance support: Feet supported;No upper extremity supported Sitting balance-Leahy Scale: Good Sitting balance - Comments: sat on eob before go supine   Standing balance support: No upper extremity supported;During functional activity Standing balance-Leahy Scale: Fair Standing balance comment: able to stand without UE support when standing at sing for UB bathing and grooming                           ADL either performed or assessed with clinical judgement   ADL Overall ADL's : Needs assistance/impaired     Grooming: Wash/dry hands;Wash/dry face;Min guard;Standing Grooming Details (indicate cue type and reason): stood at sink for grooming Upper Body Bathing: Min guard;Standing Upper Body Bathing Details (indicate cue type and reason): washed upper body standing at sink Lower Body Bathing: Min guard;Sit to/from stand Lower Body Bathing Details (indicate cue type and reason): Assist for washing front perineal area and buttocks at bedlevel after episode of bowel incontinence.         Toilet Transfer: RW;Min Psychiatric nurse Details (indicate cue type and reason): transfer to [place] and Hygiene: Min guard;Sit to/from stand Toileting - Clothing Manipulation Details (indicate cue type and reason): performed toilet hygiene while standing     Functional mobility during ADLs: Min guard;Rolling walker General ADL Comments: limited endurance when standing     Vision       Perception  Praxis      Cognition Arousal/Alertness: Awake/alert Behavior During Therapy: WFL for tasks assessed/performed Overall Cognitive Status: Within Functional Limits for tasks assessed                                  General Comments: demonstrated good safety with tasks        Exercises     Shoulder Instructions       General Comments VSS on RA. spO2 91-100% on RA    Pertinent Vitals/ Pain       Pain Assessment: No/denies pain  Home Living                                          Prior Functioning/Environment              Frequency  Min 2X/week        Progress Toward Goals  OT Goals(current goals can now be found in the care plan section)  Progress towards OT goals: Progressing toward goals  Acute Rehab OT Goals Patient Stated Goal: home OT Goal Formulation: With patient Time For Goal Achievement: 08/20/21 Potential to Achieve Goals: Good ADL Goals Pt Will Perform Eating: Independently Pt Will Perform Grooming: with modified independence;standing Pt Will Perform Upper Body Dressing: Independently Pt Will Perform Lower Body Dressing: with modified independence;sit to/from stand Pt Will Transfer to Toilet: with modified independence;ambulating Pt Will Perform Toileting - Clothing Manipulation and hygiene: with modified independence;sit to/from stand Pt Will Perform Tub/Shower Transfer: Shower transfer;Tub transfer;rolling walker;shower seat Pt/caregiver will Perform Home Exercise Program: Increased ROM;Increased strength;Both right and left upper extremity  Plan Discharge plan remains appropriate    Co-evaluation                 AM-PAC OT "6 Clicks" Daily Activity     Outcome Measure   Help from another person eating meals?: Total Help from another person taking care of personal grooming?: A Little Help from another person toileting, which includes using toliet, bedpan, or urinal?: A Little Help from another person bathing (including washing, rinsing, drying)?: A Little Help from another person to put on and taking off regular upper body clothing?: A Little Help from another person to put on and taking off regular  lower body clothing?: A Little 6 Click Score: 16    End of Session Equipment Utilized During Treatment: Oxygen;Rolling walker  OT Visit Diagnosis: Unsteadiness on feet (R26.81);Muscle weakness (generalized) (M62.81)   Activity Tolerance Patient tolerated treatment well   Patient Left in bed;with call bell/phone within reach;with bed alarm set   Nurse Communication Mobility status        Time: 7782-4235 OT Time Calculation (min): 25 min  Charges: OT General Charges $OT Visit: 1 Visit OT Treatments $Self Care/Home Management : 23-37 mins  Lodema Hong, [place] 08/12/2021, 12:29 PM

## 2021-08-12 NOTE — TOC Transition Note (Addendum)
Transition of Care [facility]) - CM/SW Discharge Note   Patient Details  Name: Christopher Santiago MRN: [id number] Date of Birth: Dec 10, 1930  [facility] of [facility] CM/SW Contact:  [name], LCSW Phone Number: 08/12/2021, 12:57 PM   Clinical Narrative:   Pt discharging home with [facility].  Trach supplies, feeding tube supplies for 3 days provided by the hospital.  Ongoing trach supplies, feeding tube supplies along with hospital bed, 3n1, suction machine, nebulizer provided by [employer].  Hospital bed, 3n1 have been delivered.  [employer] will have RT meet family at home today to deliver/set up suction machine, nebulizer, and confirm that all is in order with pt trach.  [place] clinic appt in place for 10/5.  Hospital DC appt with PCP set up for 08/15/21.  Carol in respiratory dept at [facility] will visit pt room to confirm that all needed trach supplies are present. CSW attempted to reach pt wife today, LM requesting call back to confirm her preparations.  CSW did speak with [name] who confirmed delivery of DME, asked about suction machine/nebulizer which was explained as above.  [name] confirmed they are ready to receive pt at home.  PTAR to transport and agreed to prioritize this pt due to need to meet RT at home today.    1400: PTAR here to pick pt up, [facility] notified.    Final next level of care: Home w Home Health Services Barriers to Discharge: Barriers Resolved   Patient Goals and CMS Choice Patient states their goals for this hospitalization and ongoing recovery are:: "get back home to take care of my wife" CMS Medicare.gov Compare Post Acute Care list provided to:: Patient Represenative (must comment) Choice offered to / list presented to : Adult Children  Discharge Placement                Patient to be transferred to facility by: transport home by [place] Name of family member notified: daughter Marin Roberts with wife Patient and family notified of of transfer:  08/12/21  Discharge Plan and Services   Discharge Planning Services: CM Consult Post Acute Care Choice: [place]          DME Arranged: Nebulizer machine, Suction, Oxygen, Trach supplies, Tube feeding DME Agency: [facility] Date DME Agency Contacted: 08/10/21 Time DME Agency Contacted: 51 Representative spoke with at DME Agency: Thedore Mins HH Arranged: RN, PT, OT, Nurse's Aide, Speech Therapy, Respirator Therapy HH Agency: [place] Date [place]: 08/10/21 Time [place]: 1600 Representative spoke with at [place]: [place] ([place]) Interventions     Readmission Risk Interventions No flowsheet data found.

## 2021-08-12 NOTE — Progress Notes (Signed)
Speech Language Pathology Treatment: Nada Boozer Speaking valve  Patient Details Name: Christopher Santiago MRN: [id number] DOB: [birth year] Today's Date: 08/12/2021 Time: 3570-1779 SLP Time Calculation (min) (ACUTE ONLY): 15 min  Assessment / Plan / Recommendation Clinical Impression  Pt seen upright in chair at bedside for continued PMSV education including physiological changes to upper respiratory system s/p tracheostomy. Per chart review, pt refused humidified trach collar. Educated pt on benefits for humidification and reducing risk for mucous plugging. Called pts, daughter [name] and reinforced education. PMSV provides excellent benefits for voicing however no added humidification. (If future mucous plugging becomes issue for pt consider cassette adaptor with HME and digital occlusion). Encouraged daughter to reach out to SLP with any future concerns. Pt and daughter in agreement.   HPI HPI: Pt is an 87 y/o male admitted 8/10 secondary to worsening SOB, especially with exertion. Found to have acute respiratory failure with hypoxia. Imaging showed fullness of the right aryepiglottic fold extending into the right pyriform and L renal mass. Pt had flexible laryngoscopy on 8/10, including trach.  Findings revealed bilateral true vocal folds in paramedian position with normal adduction, restricted abduction causing narrowing of the airway. No evidence of airway obstruction. Mucosal abnormality/ edema of right supraglottis/glottis noted with no exophytic mass lesion, biopsy showed squamous cell carcinoma. PEG placement 07/15/21.  PMH includes vocal cord cancer ~ 30 years ago, COPD, CAD, DM, HTN, R THA, bilateral hearing aids.      SLP Plan  Continue with current plan of care      Recommendations for follow up therapy are one component of a multi-disciplinary discharge planning process, led by the attending physician.  Recommendations may be updated based on patient status, additional functional criteria and  insurance authorization.    Recommendations  Diet recommendations: NPO Medication Administration: Via alternative means      Patient may use Passy-Muir Speech Valve: During all waking hours (remove during sleep) PMSV Supervision: Intermittent MD: Please consider changing trach tube to : Smaller size         Oral Care Recommendations: Oral care QID Follow up Recommendations: Home health SLP SLP Visit Diagnosis: [place] (R49.1) Plan: Continue with current plan of care       GO                Analiza Cowger E Shirah Roseman  08/12/2021, 10:39 AM

## 2021-08-12 NOTE — Progress Notes (Signed)
Physical Therapy Treatment Patient Details Name: Christopher Santiago MRN: [id number] DOB: 1931/02/17 Today's Date: 08/12/2021   History of Present Illness 87 y/o male admitted 8/9 secondary to worsening SOB, especially with exertion. Found to have acute respiratory failure with hypoxia. Imaging showed fullness of the right aryepiglottic fold extending into the right pyriform and L renal mass. Pt found to have left renal mass 8/9. Pt had Flexible laryngoscopy on 8/10 with trach. PEG 8/23. Acute course complicated by metabolic encephalopathy. PMHx:vocal cord cancer, COPD, CAD, DM, HTN, and R THA, gout, PVD, BPH.    PT Comments    Pt tolerated treatment well; however, was limited by incontinence during session. Pt required similar level of assist for bed mobility, transfers, and ambulation compared to previous sessions. Pt ambulated in room and hall due to incontinence and was educated on use of briefs at home. Continue to recommend HHPT given that pt moves well and has assist available.   VSS on RA. spO2 91-100%   Recommendations for follow up therapy are one component of a multi-disciplinary discharge planning process, led by the attending physician.  Recommendations may be updated based on patient status, additional functional criteria and insurance authorization.  Follow Up Recommendations  Home health PT;Supervision for mobility/OOB     Equipment Recommendations  Hospital bed    Recommendations for Other Services       Precautions / Restrictions Precautions Precautions: Fall;Other (comment) Precaution Comments: trach, peg, monitor BP, incontinent Restrictions Weight Bearing Restrictions: No     Mobility  Bed Mobility Overal bed mobility: Needs Assistance Bed Mobility: Supine to Sit     Supine to sit: Supervision;HOB elevated     General bed mobility comments: Supervision for lines. Performed with HOB elevated to 23 degrees with increased time.    Transfers Overall transfer  level: Needs assistance Equipment used: Rolling walker (2 wheeled) Transfers: Sit to/from Stand Sit to Stand: Min guard;Min assist         General transfer comment: Sit to stand x3 from bed, toilet, and BSC. Initial and 3rd trial from bed with supervision for safety/lines. 2nd trial from toilet with min guard for safety when transferring to hold grab bar and riser from RW. Verbal cues for hand placement. Pt demonstrates incontinent bowel and was educated on use of briefs for home.  Ambulation/Gait Ambulation/Gait assistance: Supervision Gait Distance (Feet): 108 Feet Assistive device: Rolling walker (2 wheeled) Gait Pattern/deviations: Step-through pattern;Decreased stride length Gait velocity: decreased Gait velocity interpretation: 1.31 - 2.62 ft/sec, indicative of limited community ambulator General Gait Details: Supervision for safety. Ambulated in room due to bowel incontinence   Stairs             Wheelchair Mobility    Modified Rankin (Stroke Patients Only)       Balance Overall balance assessment: Needs assistance Sitting-balance support: Feet supported;No upper extremity supported Sitting balance-Leahy Scale: Good     Standing balance support: Bilateral upper extremity supported;During functional activity Standing balance-Leahy Scale: Fair Standing balance comment: Able to static stand without UE support. Reliant on BUE on RW during gait                            Cognition Arousal/Alertness: Awake/alert Behavior During Therapy: WFL for tasks assessed/performed Overall Cognitive Status: Within Functional Limits for tasks assessed  Exercises      General Comments General comments (skin integrity, edema, etc.): VSS on RA. spO2 91-100% on RA      Pertinent Vitals/Pain Pain Assessment: No/denies pain    Home Living                      Prior Function            PT Goals  (current goals can now be found in the care plan section) Acute Rehab PT Goals Patient Stated Goal: home Progress towards PT goals: Progressing toward goals    Frequency    Min 3X/week      PT Plan Current plan remains appropriate    Co-evaluation              AM-PAC PT "6 Clicks" Mobility   Outcome Measure  Help needed turning from your back to your side while in a flat bed without using bedrails?: None Help needed moving from lying on your back to sitting on the side of a flat bed without using bedrails?: A Little Help needed moving to and from a bed to a chair (including a wheelchair)?: A Little Help needed standing up from a chair using your arms (e.g., wheelchair or bedside chair)?: A Little Help needed to walk in hospital room?: A Little Help needed climbing 3-5 steps with a railing? : A Little 6 Click Score: 19    End of Session Equipment Utilized During Treatment: Gait belt Activity Tolerance: Patient tolerated treatment well;Other (comment) (incontinence) Patient left: in chair;with call bell/phone within reach;with chair alarm set Nurse Communication: Mobility status PT Visit Diagnosis: Other abnormalities of gait and mobility (R26.89);Muscle weakness (generalized) (M62.81)     Time: 4765-4650 PT Time Calculation (min) (ACUTE ONLY): 43 min  Charges:  $Gait Training: 8-22 mins $Therapeutic Activity: 23-37 mins                     Louie Casa, SPT Acute Rehab: [phone]    Domingo Dimes 08/12/2021, 11:02 AM

## 2021-08-13 ENCOUNTER — Other Ambulatory Visit: Payer: Self-pay | Admitting: Family Medicine

## 2021-08-13 ENCOUNTER — Other Ambulatory Visit: Payer: Self-pay | Admitting: Internal Medicine

## 2021-08-13 ENCOUNTER — Telehealth: Payer: Self-pay | Admitting: Family Medicine

## 2021-08-13 DIAGNOSIS — I10 Essential (primary) hypertension: Secondary | ICD-10-CM

## 2021-08-13 DIAGNOSIS — I5032 Chronic diastolic (congestive) heart failure: Secondary | ICD-10-CM

## 2021-08-13 DIAGNOSIS — I25118 Atherosclerotic heart disease of native coronary artery with other forms of angina pectoris: Secondary | ICD-10-CM

## 2021-08-13 DIAGNOSIS — J441 Chronic obstructive pulmonary disease with (acute) exacerbation: Secondary | ICD-10-CM

## 2021-08-13 DIAGNOSIS — J470 Bronchiectasis with acute lower respiratory infection: Secondary | ICD-10-CM

## 2021-08-13 DIAGNOSIS — Z430 Encounter for attention to tracheostomy: Secondary | ICD-10-CM

## 2021-08-13 MED ORDER — BUDESONIDE 0.25 MG/2ML IN SUSP: 0.2500 mg | Freq: Two times a day (BID) | RESPIRATORY_TRACT | 0 refills | Status: DC

## 2021-08-13 NOTE — Telephone Encounter (Signed)
Spoke to pt's wife. She needs Pulmicort sent to [facility]. The hospital sent it to [facility] and they do not have it. Suggested it be sent to [facility]. I have done that.  Wife is asking if hospital f/u on Friday could be virtual. She does not think she can get him out of the house. Please advise at [phone]

## 2021-08-13 NOTE — Telephone Encounter (Signed)
Thanks for sending the rx.  If Friday needs to be virtual, then it sounds like that is the only option.

## 2021-08-13 NOTE — Telephone Encounter (Signed)
Pt wife [name] called stated pt need medication that was given in the hospital need to be refill . Would like a call back [phone]

## 2021-08-14 ENCOUNTER — Encounter (HOSPITAL_COMMUNITY): Payer: Self-pay | Admitting: Emergency Medicine

## 2021-08-14 ENCOUNTER — Other Ambulatory Visit: Payer: Self-pay

## 2021-08-14 ENCOUNTER — Emergency Department (HOSPITAL_COMMUNITY)
Admission: EM | Admit: 2021-08-14 | Discharge: 2021-08-14 | Disposition: A | Discharge status: Home / Self Care | Payer: Medicare Other | Attending: Emergency Medicine | Admitting: Emergency Medicine

## 2021-08-14 DIAGNOSIS — Z930 Tracheostomy status: Secondary | ICD-10-CM

## 2021-08-14 DIAGNOSIS — I2510 Atherosclerotic heart disease of native coronary artery without angina pectoris: Secondary | ICD-10-CM | POA: Insufficient documentation

## 2021-08-14 DIAGNOSIS — Z85828 Personal history of other malignant neoplasm of skin: Secondary | ICD-10-CM | POA: Diagnosis not present

## 2021-08-14 DIAGNOSIS — J441 Chronic obstructive pulmonary disease with (acute) exacerbation: Secondary | ICD-10-CM | POA: Insufficient documentation

## 2021-08-14 DIAGNOSIS — I5032 Chronic diastolic (congestive) heart failure: Secondary | ICD-10-CM | POA: Insufficient documentation

## 2021-08-14 DIAGNOSIS — Z79899 Other long term (current) drug therapy: Secondary | ICD-10-CM | POA: Diagnosis not present

## 2021-08-14 DIAGNOSIS — Z89022 Acquired absence of left finger(s): Secondary | ICD-10-CM | POA: Insufficient documentation

## 2021-08-14 DIAGNOSIS — I25118 Atherosclerotic heart disease of native coronary artery with other forms of angina pectoris: Secondary | ICD-10-CM | POA: Insufficient documentation

## 2021-08-14 DIAGNOSIS — Z87891 Personal history of nicotine dependence: Secondary | ICD-10-CM | POA: Diagnosis not present

## 2021-08-14 DIAGNOSIS — Z7982 Long term (current) use of aspirin: Secondary | ICD-10-CM | POA: Insufficient documentation

## 2021-08-14 DIAGNOSIS — F0390 Unspecified dementia without behavioral disturbance: Secondary | ICD-10-CM | POA: Insufficient documentation

## 2021-08-14 DIAGNOSIS — I110 Hypertensive heart disease with heart failure: Secondary | ICD-10-CM | POA: Insufficient documentation

## 2021-08-14 DIAGNOSIS — Z96641 Presence of right artificial hip joint: Secondary | ICD-10-CM | POA: Diagnosis not present

## 2021-08-14 DIAGNOSIS — J9509 Other tracheostomy complication: Secondary | ICD-10-CM | POA: Diagnosis not present

## 2021-08-14 DIAGNOSIS — N390 Urinary tract infection, site not specified: Secondary | ICD-10-CM

## 2021-08-14 LAB — URINALYSIS, ROUTINE W REFLEX MICROSCOPIC
Bilirubin Urine: NEGATIVE
Glucose, UA: NEGATIVE mg/dL
Hgb urine dipstick: NEGATIVE
Ketones, ur: NEGATIVE mg/dL
Nitrite: POSITIVE — AB
Protein, ur: 100 mg/dL — AB
Specific Gravity, Urine: 1.01 (ref 1.005–1.030)
pH: 8.5 — ABNORMAL HIGH (ref 5.0–8.0)

## 2021-08-14 LAB — URINALYSIS, MICROSCOPIC (REFLEX)

## 2021-08-14 MED ORDER — CEPHALEXIN 250 MG PO CAPS: 500.0000 mg | ORAL_CAPSULE | Freq: Once | ORAL | Status: DC

## 2021-08-14 MED ORDER — CEPHALEXIN 500 MG PO CAPS: 500.0000 mg | ORAL_CAPSULE | Freq: Four times a day (QID) | ORAL | 0 refills | Status: DC

## 2021-08-14 MED ORDER — CEPHALEXIN 250 MG/5ML PO SUSR: 500.0000 mg | Freq: Four times a day (QID) | ORAL | 0 refills | Status: DC

## 2021-08-14 MED ORDER — MORPHINE SULFATE (PF) 4 MG/ML IV SOLN
4.0000 mg | Freq: Once | INTRAVENOUS | Status: DC
Start: 2021-08-14 — End: 2021-08-14
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
## 2021-08-14 NOTE — Telephone Encounter (Signed)
Looks like visit has already been changed to VV; called and left message for patients wife that this was okay.

## 2021-08-14 NOTE — ED Notes (Signed)
[facility] called for transport back home

## 2021-08-14 NOTE — Progress Notes (Signed)
Subjective: Patient known to our service came to ER today after his trach tube came out.  There is difficulty replacing it in the ER.  He is breathing fairly comfortably.  Objective: Vital signs in last 24 hours: Temp:  [97.6 F (36.4 C)] 97.6 F (36.4 C) (09/22 0922) Pulse Rate:  [64-80] 64 (09/22 1147) Resp:  [17-23] 17 (09/22 1147) BP: (143-178)/(65-85) 150/76 (09/22 1147) SpO2:  [95 %-99 %] 97 % (09/22 1147) Weight:  [31 kg] 69 kg (09/22 0923) Wt Readings from Last 1 Encounters:  08/14/21 69 kg    Intake/Output from previous day: No intake/output data recorded. Intake/Output this shift: No intake/output data recorded.  General appearance: alert, cooperative, and no distress Neck: trach site with granulation tissue and track not well-open  No results for input(s): WBC, HGB, HCT, PLT in the last 72 hours.  No results for input(s): NA, K, CL, CO2, GLUCOSE, BUN, CREATININE, CALCIUM in the last 72 hours.  Medications: I have reviewed the patient's current medications.  Assessment/Plan: Tracheostomy status with tracheostomy tube dislodged, laryngeal cancer  At the bedside, the trach site was dilated serially with urethral dilators and then a 7.5 endotracheal tube.  The #6 proximal XLT tube was then replaced and then suctioned.  The suction catheter passed easily distal to the tube and he is breathing well through the tube.   LOS: 0 days   Melida Quitter 08/14/2021, 12:12 PM

## 2021-08-14 NOTE — ED Notes (Signed)
RT attempted to place another trach tube. Unable to insert at this time due to tracheostomy closing.

## 2021-08-14 NOTE — Progress Notes (Signed)
Attempt to insert a 6XLT cuffless proximal was unsuccessful. The largest diameter I was able to fit through the stoma was a 14 french suction catheter and met resistance but was able to get inside airway and suction pt out. Pt has blood coming from stoma site after attempt was made, 4'4" gauze used to cover stoma at this time and 6L [place] placed. Pt is in no distress at this time but is coughing up a copious amount of yellow secretions. ED RN aware of failed attempt, pt was trached by Dr. Fredric Dine with ENT on 8/10 of this year.

## 2021-08-14 NOTE — Discharge Instructions (Addendum)
You have been seen and discharged from the emergency department.  Your tracheostomy was replaced by ENT Dr. Redmond Baseman.  Follow-up with your primary provider for reevaluation and further care. Take home medications as prescribed. If you have any worsening symptoms or further concerns for your health please return to an emergency department for further evaluation.

## 2021-08-14 NOTE — ED Triage Notes (Signed)
Pt arrives via EMS from home for trach being accidentally dislodged in the night. EMS believes that the trach has been out approximately 5 hours. SpO2 96% at this time and pt is not in any respiratory distress at this time. DNR at bedside. Rt at bedside

## 2021-08-15 ENCOUNTER — Encounter: Payer: Self-pay | Admitting: Family Medicine

## 2021-08-15 ENCOUNTER — Telehealth (INDEPENDENT_AMBULATORY_CARE_PROVIDER_SITE_OTHER): Payer: Medicare Other | Admitting: Family Medicine

## 2021-08-15 ENCOUNTER — Telehealth: Payer: Self-pay | Admitting: Family Medicine

## 2021-08-15 DIAGNOSIS — C321 Malignant neoplasm of supraglottis: Secondary | ICD-10-CM

## 2021-08-15 MED ORDER — LORAZEPAM 0.5 MG PO TABS: 0.5000 mg | ORAL_TABLET | Freq: Three times a day (TID) | ORAL | 1 refills | Status: DC | PRN

## 2021-08-15 MED ORDER — HYDROXYZINE HCL 10 MG PO TABS: 10.0000 mg | ORAL_TABLET | Freq: Three times a day (TID) | ORAL | 1 refills | Status: DC | PRN

## 2021-08-15 NOTE — Telephone Encounter (Addendum)
Please verify with family that pt/family consents.  I think this makes sense but I want to verify.  Then I can put in the referral.  Thanks.

## 2021-08-15 NOTE — Progress Notes (Signed)
Virtual visit completed through WebEx or similar program Patient location: home  Provider location: [place] at [facility], office  Participants: Patient and me (unless stated otherwise below)  Pandemic considerations d/w pt.   Limitations and rationale for visit method d/w patient.  Patient agreed to proceed.   CC: Inpatient follow-up.  HPI: Recently admitted and diagnosed with laryngeal cancer and he opted not for aggressive treatment.  He required PEG tube placement and tracheostomy placement.  He was able to be discharged home.  He is now at home in an adjustable bed with family at home health caring for him.  His goal is comfort and being at home.  He is having phlegm in his throat that is bothersome and is having to use suction for that.  He has had some agitation at night where he is wanting to get out of the bed inappropriately.  At baseline his functional status is limited to taking 3 or 4 steps away from the bed at a time.  He is not having fevers.  He is tolerating tube feeds.  He had tracheostomy replaced at emergency room visit recently, discussed.  His main concern was comfort and to address agitation and phlegm in his throat.  Meds and allergies reviewed.   ROS: Per HPI unless specifically indicated in ROS section   NAD Tracheostomy in place. Breath does not appear labored.  A/P: Laryngeal cancer, opting for comfort care.  He has home health and support from family at home.  We talked about trying to use hydroxyzine in addition to suction to see if that will help with the phlegm buildup in his throat.  It may also help some with agitation at night.  Sedation caution given to patient.  If he is not getting relief of agitation with that then he can try lorazepam at night.  Routine cautions given.  We will check with ENT in the meantime to see what else can be done about the phlegm buildup in his throat.  I told him I respect his decision for comfort care and we will work to  meet his stated goals.  I thank all involved.

## 2021-08-15 NOTE — Telephone Encounter (Signed)
Christy from Hospice would like for Dr Damita Dunnings to send a referral

## 2021-08-15 NOTE — Telephone Encounter (Signed)
LMTCB

## 2021-08-16 LAB — URINE CULTURE: Culture: >=100000 — AB

## 2021-08-17 ENCOUNTER — Telehealth: Payer: Self-pay | Admitting: Family Medicine

## 2021-08-17 ENCOUNTER — Telehealth: Payer: Self-pay | Admitting: Emergency Medicine

## 2021-08-17 NOTE — Assessment & Plan Note (Signed)
Laryngeal cancer, opting for comfort care.  He has home health and support from family at home.  We talked about trying to use hydroxyzine in addition to suction to see if that will help with the phlegm buildup in his throat.  It may also help some with agitation at night.  Sedation caution given to patient.  If he is not getting relief of agitation with that then he can try lorazepam at night.  Routine cautions given.  We will check with ENT in the meantime to see what else can be done about the phlegm buildup in his throat.  I told him I respect his decision for comfort care and we will work to meet his stated goals.  I thank all involved.

## 2021-08-17 NOTE — Telephone Encounter (Signed)
Please check with Dr. Redmond Baseman with ENT.  Patient is bothered by phlegm accumulation with his trach.  Has a scopolamine patch and I sent a prescription for hydroxyzine.  He is also using suction.  If Dr. Redmond Baseman has other ideas then I am interested.  Thanks.

## 2021-08-17 NOTE — Telephone Encounter (Signed)
Post ED Visit - Positive Culture Follow-up  Culture report reviewed by antimicrobial stewardship pharmacist: [place] Team []  Elenor Quinones, Pharm.D. []  Heide Guile, Pharm.D., BCPS AQ-ID []  Parks Neptune, Pharm.D., BCPS []  Alycia Rossetti, Pharm.D., BCPS []  [place], [place].D., BCPS, AAHIVP []  Legrand Como, Pharm.D., BCPS, AAHIVP []  Salome Arnt, PharmD, BCPS []  Johnnette Gourd, PharmD, BCPS []  Hughes Better, PharmD, BCPS [x]  Lorelei Pont, PharmD []  Laqueta Linden, PharmD, BCPS []  Albertina Parr, PharmD  [place] Team []  Leodis Sias, PharmD []  Lindell Spar, PharmD []  Royetta Asal, PharmD []  Graylin Shiver, Rph []  Rema Fendt) Glennon Mac, PharmD []  Arlyn Dunning, PharmD []  Netta Cedars, PharmD []  Dia Sitter, PharmD []  Leone Haven, PharmD []  Gretta Arab, PharmD []  Theodis Shove, PharmD []  Peggyann Juba, PharmD []  Reuel Boom, PharmD   Positive urine culture Treated with Cephalexin, organism sensitive to the same and no further patient follow-up is required at this time.  Sandi Raveling Kenika Sahm 08/17/2021, 6:54 PM

## 2021-08-19 ENCOUNTER — Other Ambulatory Visit: Payer: Self-pay

## 2021-08-19 ENCOUNTER — Encounter (HOSPITAL_COMMUNITY): Payer: Self-pay | Admitting: *Deleted

## 2021-08-19 ENCOUNTER — Inpatient Hospital Stay (HOSPITAL_COMMUNITY)
Admission: EM | Admit: 2021-08-19 | Discharge: 2021-08-24 | DRG: 202 | Disposition: A | Discharge status: Hospice | Payer: Medicare Other | Attending: Internal Medicine | Admitting: Internal Medicine

## 2021-08-19 ENCOUNTER — Emergency Department (HOSPITAL_COMMUNITY): Payer: Medicare Other

## 2021-08-19 DIAGNOSIS — I2510 Atherosclerotic heart disease of native coronary artery without angina pectoris: Secondary | ICD-10-CM | POA: Diagnosis present

## 2021-08-19 DIAGNOSIS — S51012A Laceration without foreign body of left elbow, initial encounter: Secondary | ICD-10-CM | POA: Diagnosis present

## 2021-08-19 DIAGNOSIS — Z8249 Family history of ischemic heart disease and other diseases of the circulatory system: Secondary | ICD-10-CM

## 2021-08-19 DIAGNOSIS — I10 Essential (primary) hypertension: Secondary | ICD-10-CM | POA: Diagnosis present

## 2021-08-19 DIAGNOSIS — W06XXXA Fall from bed, initial encounter: Secondary | ICD-10-CM | POA: Diagnosis present

## 2021-08-19 DIAGNOSIS — I25118 Atherosclerotic heart disease of native coronary artery with other forms of angina pectoris: Secondary | ICD-10-CM | POA: Diagnosis not present

## 2021-08-19 DIAGNOSIS — Z930 Tracheostomy status: Secondary | ICD-10-CM | POA: Diagnosis not present

## 2021-08-19 DIAGNOSIS — J0410 Acute tracheitis without obstruction: Secondary | ICD-10-CM | POA: Diagnosis present

## 2021-08-19 DIAGNOSIS — I739 Peripheral vascular disease, unspecified: Secondary | ICD-10-CM | POA: Diagnosis present

## 2021-08-19 DIAGNOSIS — E1151 Type 2 diabetes mellitus with diabetic peripheral angiopathy without gangrene: Secondary | ICD-10-CM | POA: Diagnosis present

## 2021-08-19 DIAGNOSIS — S51011A Laceration without foreign body of right elbow, initial encounter: Secondary | ICD-10-CM | POA: Diagnosis present

## 2021-08-19 DIAGNOSIS — N138 Other obstructive and reflux uropathy: Secondary | ICD-10-CM | POA: Diagnosis not present

## 2021-08-19 DIAGNOSIS — Z9842 Cataract extraction status, left eye: Secondary | ICD-10-CM

## 2021-08-19 DIAGNOSIS — Z9841 Cataract extraction status, right eye: Secondary | ICD-10-CM

## 2021-08-19 DIAGNOSIS — I272 Pulmonary hypertension, unspecified: Secondary | ICD-10-CM | POA: Diagnosis present

## 2021-08-19 DIAGNOSIS — F32A Depression, unspecified: Secondary | ICD-10-CM | POA: Diagnosis present

## 2021-08-19 DIAGNOSIS — G4700 Insomnia, unspecified: Secondary | ICD-10-CM | POA: Diagnosis present

## 2021-08-19 DIAGNOSIS — Z8521 Personal history of malignant neoplasm of larynx: Secondary | ICD-10-CM

## 2021-08-19 DIAGNOSIS — Z79899 Other long term (current) drug therapy: Secondary | ICD-10-CM

## 2021-08-19 DIAGNOSIS — C321 Malignant neoplasm of supraglottis: Secondary | ICD-10-CM | POA: Diagnosis not present

## 2021-08-19 DIAGNOSIS — K269 Duodenal ulcer, unspecified as acute or chronic, without hemorrhage or perforation: Secondary | ICD-10-CM | POA: Diagnosis present

## 2021-08-19 DIAGNOSIS — E871 Hypo-osmolality and hyponatremia: Secondary | ICD-10-CM | POA: Diagnosis present

## 2021-08-19 DIAGNOSIS — S21219A Laceration without foreign body of unspecified back wall of thorax without penetration into thoracic cavity, initial encounter: Secondary | ICD-10-CM | POA: Diagnosis present

## 2021-08-19 DIAGNOSIS — F03918 Unspecified dementia, unspecified severity, with other behavioral disturbance: Secondary | ICD-10-CM | POA: Diagnosis present

## 2021-08-19 DIAGNOSIS — F05 Delirium due to known physiological condition: Secondary | ICD-10-CM | POA: Diagnosis present

## 2021-08-19 DIAGNOSIS — Z430 Encounter for attention to tracheostomy: Secondary | ICD-10-CM | POA: Diagnosis not present

## 2021-08-19 DIAGNOSIS — Z515 Encounter for palliative care: Secondary | ICD-10-CM

## 2021-08-19 DIAGNOSIS — I77810 Thoracic aortic ectasia: Secondary | ICD-10-CM | POA: Diagnosis present

## 2021-08-19 DIAGNOSIS — J9621 Acute and chronic respiratory failure with hypoxia: Secondary | ICD-10-CM

## 2021-08-19 DIAGNOSIS — Z7982 Long term (current) use of aspirin: Secondary | ICD-10-CM

## 2021-08-19 DIAGNOSIS — S81012A Laceration without foreign body, left knee, initial encounter: Secondary | ICD-10-CM | POA: Diagnosis present

## 2021-08-19 DIAGNOSIS — Z8711 Personal history of peptic ulcer disease: Secondary | ICD-10-CM

## 2021-08-19 DIAGNOSIS — Z66 Do not resuscitate: Secondary | ICD-10-CM | POA: Diagnosis present

## 2021-08-19 DIAGNOSIS — Z7951 Long term (current) use of inhaled steroids: Secondary | ICD-10-CM

## 2021-08-19 DIAGNOSIS — E782 Mixed hyperlipidemia: Secondary | ICD-10-CM | POA: Diagnosis not present

## 2021-08-19 DIAGNOSIS — R52 Pain, unspecified: Secondary | ICD-10-CM

## 2021-08-19 DIAGNOSIS — Y92003 Bedroom of unspecified non-institutional (private) residence as the place of occurrence of the external cause: Secondary | ICD-10-CM

## 2021-08-19 DIAGNOSIS — Z833 Family history of diabetes mellitus: Secondary | ICD-10-CM

## 2021-08-19 DIAGNOSIS — Z95828 Presence of other vascular implants and grafts: Secondary | ICD-10-CM | POA: Diagnosis not present

## 2021-08-19 DIAGNOSIS — K760 Fatty (change of) liver, not elsewhere classified: Secondary | ICD-10-CM | POA: Diagnosis present

## 2021-08-19 DIAGNOSIS — J9503 Malfunction of tracheostomy stoma: Secondary | ICD-10-CM | POA: Diagnosis present

## 2021-08-19 DIAGNOSIS — N401 Enlarged prostate with lower urinary tract symptoms: Secondary | ICD-10-CM | POA: Diagnosis not present

## 2021-08-19 DIAGNOSIS — E860 Dehydration: Secondary | ICD-10-CM | POA: Diagnosis present

## 2021-08-19 DIAGNOSIS — Z96641 Presence of right artificial hip joint: Secondary | ICD-10-CM | POA: Diagnosis present

## 2021-08-19 DIAGNOSIS — Z20822 Contact with and (suspected) exposure to covid-19: Secondary | ICD-10-CM | POA: Diagnosis present

## 2021-08-19 DIAGNOSIS — G9341 Metabolic encephalopathy: Secondary | ICD-10-CM | POA: Diagnosis present

## 2021-08-19 DIAGNOSIS — J449 Chronic obstructive pulmonary disease, unspecified: Secondary | ICD-10-CM | POA: Diagnosis present

## 2021-08-19 DIAGNOSIS — G4733 Obstructive sleep apnea (adult) (pediatric): Secondary | ICD-10-CM | POA: Diagnosis present

## 2021-08-19 DIAGNOSIS — R911 Solitary pulmonary nodule: Secondary | ICD-10-CM | POA: Diagnosis present

## 2021-08-19 DIAGNOSIS — D3500 Benign neoplasm of unspecified adrenal gland: Secondary | ICD-10-CM | POA: Diagnosis present

## 2021-08-19 DIAGNOSIS — Z961 Presence of intraocular lens: Secondary | ICD-10-CM | POA: Diagnosis present

## 2021-08-19 DIAGNOSIS — F0390 Unspecified dementia without behavioral disturbance: Secondary | ICD-10-CM | POA: Diagnosis present

## 2021-08-19 DIAGNOSIS — M109 Gout, unspecified: Secondary | ICD-10-CM | POA: Diagnosis present

## 2021-08-19 DIAGNOSIS — Z87891 Personal history of nicotine dependence: Secondary | ICD-10-CM

## 2021-08-19 DIAGNOSIS — Z888 Allergy status to other drugs, medicaments and biological substances status: Secondary | ICD-10-CM

## 2021-08-19 LAB — CBC WITH DIFFERENTIAL/PLATELET
Abs Immature Granulocytes: 0.12 10*3/uL — ABNORMAL HIGH (ref 0.00–0.07)
Basophils Absolute: 0 10*3/uL (ref 0.0–0.1)
Basophils Relative: 0 %
Eosinophils Absolute: 0 10*3/uL (ref 0.0–0.5)
Eosinophils Relative: 0 %
HCT: 34 % — ABNORMAL LOW (ref 39.0–52.0)
Hemoglobin: 10.8 g/dL — ABNORMAL LOW (ref 13.0–17.0)
Immature Granulocytes: 1 %
Lymphocytes Relative: 5 %
Lymphs Abs: 0.9 10*3/uL (ref 0.7–4.0)
MCH: 30 pg (ref 26.0–34.0)
MCHC: 31.8 g/dL (ref 30.0–36.0)
MCV: 94.4 fL (ref 80.0–100.0)
Monocytes Absolute: 1.2 10*3/uL — ABNORMAL HIGH (ref 0.1–1.0)
Monocytes Relative: 7 %
Neutro Abs: 14.9 10*3/uL — ABNORMAL HIGH (ref 1.7–7.7)
Neutrophils Relative %: 87 %
Platelets: 193 10*3/uL (ref 150–400)
RBC: 3.6 MIL/uL — ABNORMAL LOW (ref 4.22–5.81)
RDW: 13.9 % (ref 11.5–15.5)
WBC: 17.2 10*3/uL — ABNORMAL HIGH (ref 4.0–10.5)
nRBC: 0 % (ref 0.0–0.2)

## 2021-08-19 LAB — BASIC METABOLIC PANEL
Anion gap: 8 (ref 5–15)
BUN: 27 mg/dL — ABNORMAL HIGH (ref 8–23)
CO2: 31 mmol/L (ref 22–32)
Calcium: 9 mg/dL (ref 8.9–10.3)
Chloride: 91 mmol/L — ABNORMAL LOW (ref 98–111)
Creatinine, Ser: 0.92 mg/dL (ref 0.61–1.24)
GFR, Estimated: >60 mL/min (ref >60)
Glucose, Bld: 156 mg/dL — ABNORMAL HIGH (ref 70–99)
Potassium: 4.8 mmol/L (ref 3.5–5.1)
Sodium: 130 mmol/L — ABNORMAL LOW (ref 135–145)

## 2021-08-19 LAB — RESP PANEL BY RT-PCR (FLU A&B, COVID) ARPGX2
Influenza A by PCR: NEGATIVE
Influenza B by PCR: NEGATIVE
SARS Coronavirus 2 by RT PCR: NEGATIVE

## 2021-08-19 LAB — LACTIC ACID, PLASMA: Lactic Acid, Venous: 3.9 mmol/L (ref 0.5–1.9)

## 2021-08-19 LAB — CBG MONITORING, ED: Glucose-Capillary: 136 mg/dL — ABNORMAL HIGH (ref 70–99)

## 2021-08-19 MED ORDER — ACETAMINOPHEN 650 MG RE SUPP: 650.0000 mg | Freq: Four times a day (QID) | RECTAL | Status: DC | PRN

## 2021-08-19 MED ORDER — INSULIN ASPART 100 UNIT/ML IJ SOLN
0.0000 [IU] | Freq: Three times a day (TID) | INTRAMUSCULAR | Status: DC
  Administered 2021-08-20 – 2021-08-21 (×2): 2 [IU] via SUBCUTANEOUS
  Administered 2021-08-22 – 2021-08-23 (×2): 1 [IU] via SUBCUTANEOUS

## 2021-08-19 MED ORDER — MELATONIN 5 MG PO TABS
5.0000 mg | ORAL_TABLET | Freq: Every day | ORAL | Status: DC
  Administered 2021-08-19 – 2021-08-23 (×5): 5 mg via ORAL
  Filled 2021-08-19 (×5): qty 1

## 2021-08-19 MED ORDER — SCOPOLAMINE 1 MG/3DAYS TD PT72: 1.0000 | MEDICATED_PATCH | TRANSDERMAL | Status: DC

## 2021-08-19 MED ORDER — TRAZODONE HCL 50 MG PO TABS
50.0000 mg | ORAL_TABLET | Freq: Every evening | ORAL | Status: DC | PRN
  Administered 2021-08-19: 50 mg via ORAL
  Filled 2021-08-19: qty 1

## 2021-08-19 MED ORDER — CITALOPRAM HYDROBROMIDE 40 MG PO TABS
60.0000 mg | ORAL_TABLET | Freq: Every day | ORAL | Status: DC
  Administered 2021-08-19 – 2021-08-23 (×5): 60 mg
  Filled 2021-08-19 (×2): qty 1
  Filled 2021-08-19 (×2): qty 6
  Filled 2021-08-19: qty 1

## 2021-08-19 MED ORDER — BUDESONIDE 0.25 MG/2ML IN SUSP
0.2500 mg | Freq: Two times a day (BID) | RESPIRATORY_TRACT | Status: DC
  Administered 2021-08-20 – 2021-08-24 (×8): 0.25 mg via RESPIRATORY_TRACT
  Filled 2021-08-19 (×8): qty 2

## 2021-08-19 MED ORDER — LACTATED RINGERS IV SOLN: INTRAVENOUS | Status: DC

## 2021-08-19 MED ORDER — ALLOPURINOL 100 MG PO TABS
100.0000 mg | ORAL_TABLET | Freq: Every day | ORAL | Status: DC
  Administered 2021-08-19 – 2021-08-23 (×5): 100 mg
  Filled 2021-08-19 (×5): qty 1

## 2021-08-19 MED ORDER — IPRATROPIUM-ALBUTEROL 0.5-2.5 (3) MG/3ML IN SOLN
3.0000 mL | RESPIRATORY_TRACT | Status: DC | PRN
  Filled 2021-08-19: qty 3

## 2021-08-19 MED ORDER — SENNOSIDES-DOCUSATE SODIUM 8.6-50 MG PO TABS
1.0000 | ORAL_TABLET | Freq: Two times a day (BID) | ORAL | Status: DC
  Administered 2021-08-19 – 2021-08-23 (×9): 1
  Filled 2021-08-19 (×9): qty 1

## 2021-08-19 MED ORDER — QUETIAPINE FUMARATE 25 MG PO TABS
25.0000 mg | ORAL_TABLET | Freq: Two times a day (BID) | ORAL | Status: DC | PRN
  Administered 2021-08-19 – 2021-08-23 (×4): 25 mg via ORAL
  Filled 2021-08-19 (×5): qty 1

## 2021-08-19 MED ORDER — ASPIRIN 81 MG PO CHEW
81.0000 mg | CHEWABLE_TABLET | Freq: Every day | ORAL | Status: DC
  Administered 2021-08-19 – 2021-08-23 (×5): 81 mg
  Filled 2021-08-19 (×5): qty 1

## 2021-08-19 MED ORDER — OSMOLITE 1.5 CAL PO LIQD
355.0000 mL | Freq: Three times a day (TID) | ORAL | Status: DC
  Administered 2021-08-19 – 2021-08-23 (×17): 355 mL
  Filled 2021-08-19 (×16): qty 474

## 2021-08-19 MED ORDER — ONDANSETRON HCL 4 MG PO TABS: 4.0000 mg | ORAL_TABLET | Freq: Four times a day (QID) | ORAL | Status: DC | PRN

## 2021-08-19 MED ORDER — VANCOMYCIN HCL 1500 MG/300ML IV SOLN: 1500.0000 mg | INTRAVENOUS | Status: DC

## 2021-08-19 MED ORDER — SODIUM CHLORIDE 0.9 % IV SOLN
2.0000 g | Freq: Two times a day (BID) | INTRAVENOUS | Status: DC
  Administered 2021-08-19: 2 g via INTRAVENOUS
  Filled 2021-08-19: qty 2

## 2021-08-19 MED ORDER — FREE WATER
200.0000 mL | Freq: Four times a day (QID) | Status: DC
  Administered 2021-08-19 – 2021-08-20 (×3): 200 mL

## 2021-08-19 MED ORDER — ACETAMINOPHEN 325 MG PO TABS
650.0000 mg | ORAL_TABLET | Freq: Four times a day (QID) | ORAL | Status: DC | PRN
  Administered 2021-08-21 – 2021-08-23 (×2): 650 mg via ORAL
  Filled 2021-08-19 (×2): qty 2

## 2021-08-19 MED ORDER — GUAIFENESIN 100 MG/5ML PO SOLN
10.0000 mL | Freq: Three times a day (TID) | ORAL | Status: DC
  Administered 2021-08-19 – 2021-08-24 (×13): 200 mg
  Filled 2021-08-19 (×3): qty 10
  Filled 2021-08-19: qty 5
  Filled 2021-08-19 (×12): qty 10

## 2021-08-19 MED ORDER — IPRATROPIUM-ALBUTEROL 0.5-2.5 (3) MG/3ML IN SOLN
3.0000 mL | Freq: Four times a day (QID) | RESPIRATORY_TRACT | Status: DC
  Administered 2021-08-19 – 2021-08-20 (×3): 3 mL via RESPIRATORY_TRACT
  Filled 2021-08-19 (×3): qty 3

## 2021-08-19 MED ORDER — AMLODIPINE BESYLATE 5 MG PO TABS
5.0000 mg | ORAL_TABLET | Freq: Every day | ORAL | Status: DC
  Administered 2021-08-19 – 2021-08-23 (×5): 5 mg
  Filled 2021-08-19 (×5): qty 1

## 2021-08-19 MED ORDER — ONDANSETRON HCL 4 MG/2ML IJ SOLN: 4.0000 mg | Freq: Four times a day (QID) | INTRAMUSCULAR | Status: DC | PRN

## 2021-08-19 MED ORDER — CEFDINIR 300 MG PO CAPS
300.0000 mg | ORAL_CAPSULE | Freq: Two times a day (BID) | ORAL | Status: DC
  Administered 2021-08-19 – 2021-08-23 (×9): 300 mg via ORAL
  Filled 2021-08-19 (×11): qty 1

## 2021-08-19 MED ORDER — VANCOMYCIN HCL 1500 MG/300ML IV SOLN
1500.0000 mg | Freq: Once | INTRAVENOUS | Status: AC
  Administered 2021-08-19: 1500 mg via INTRAVENOUS
  Filled 2021-08-19: qty 300

## 2021-08-19 MED ORDER — HYDROXYZINE HCL 10 MG PO TABS: 10.0000 mg | ORAL_TABLET | Freq: Three times a day (TID) | ORAL | Status: DC | PRN

## 2021-08-19 MED ORDER — LACTATED RINGERS IV BOLUS
500.0000 mL | Freq: Once | INTRAVENOUS | Status: AC
  Administered 2021-08-19: 500 mL via INTRAVENOUS

## 2021-08-19 MED ORDER — HYDRALAZINE HCL 20 MG/ML IJ SOLN: 10.0000 mg | Freq: Three times a day (TID) | INTRAMUSCULAR | Status: DC | PRN

## 2021-08-19 MED ORDER — PANTOPRAZOLE 2 MG/ML SUSPENSION
40.0000 mg | Freq: Two times a day (BID) | ORAL | Status: DC
  Administered 2021-08-19 – 2021-08-23 (×9): 40 mg
  Filled 2021-08-19 (×10): qty 20

## 2021-08-19 NOTE — Telephone Encounter (Signed)
Patient is currently being admitted into the hospital and Dr. Damita Dunnings is aware. Will f/u with patient/family once discharged.

## 2021-08-19 NOTE — ED Notes (Signed)
Pt making multiple attempts to get out of bed and pulling off VS monitors.  Pt suctioned and repositioned back into bed

## 2021-08-19 NOTE — Progress Notes (Addendum)
HOSPITAL MEDICINE OVERNIGHT EVENT NOTE    Notified by nursing that patient's lactic acid is 3.9.  Chart reviewed, patient will currently being treated for recurrent tracheitis with intravenous antibiotic therapy as well as acute hypoxic respiratory failure.  Patient does have a history of diastolic congestive heart failure but is currently euvolemic.  Will initiate 500 cc lactated Ringer bolus followed by intravenous infusion.  We will repeat lactic acid level in several hours to ensure that this is downtrending.  Vernelle Emerald  MD [facility]   ADDENDUM (9/28 3:45AM)  Repeat lactic acid now down to 1.0.  We will discontinue remainder of lactated Ringer infusion.  Remainder of treatment plan unchanged.  Christopher Santiago

## 2021-08-19 NOTE — ED Triage Notes (Signed)
Trach collar placed by RT

## 2021-08-19 NOTE — ED Notes (Signed)
Pt up at foot of bed again, IV pulled out and monitoring removed.  Pt assisted back into to bed, this RN attempted x2 for new IV access.

## 2021-08-19 NOTE — H&P (Signed)
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 The patient's presenting symptoms, physical exam findings, and initial radiographic and laboratory data in the context of their chronic comorbidities is felt to place them at high risk for further clinical deterioration. The following factors support the patient status  of inpatient : Acute hypoxic respiratory failure, delirium, recurrent tracheitis requiring IV antibiotics.     Guilford Shi MD [facility] Pager in [place]  If 7PM-7AM, please contact night-coverage [email or web address]   08/19/2021, 5:03 PM

## 2021-08-19 NOTE — ED Notes (Signed)
2 skin tears to the mid thoracic back area, right posterior upper arm, left posterior elbow and just above the left knee. Bleeding controlled to all.

## 2021-08-19 NOTE — ED Notes (Signed)
Pt repositioned in the bed again, found in bed with legs over the side rail. Pt appears more agitated, medications given for anxiety. Dry brief placed.

## 2021-08-19 NOTE — ED Notes (Signed)
Pt attempting to crawl out of bed. Assisted back into bed again.
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
## 2021-08-19 NOTE — ED Notes (Addendum)
Pt attempting to get out of bed again  Pt suctioned. Pt brief changed and new sheets placed on bed.  Pt boosted and sat up straight for help with coughing.

## 2021-08-19 NOTE — ED Notes (Signed)
Pt attempted to get out of bed again, crawled out of bed and was standing at side of bed and then tried to get back in but was to weak to fulling get in bed independently

## 2021-08-19 NOTE — ED Notes (Signed)
Pt transported to CT ?

## 2021-08-19 NOTE — ED Triage Notes (Signed)
Pt arrives via [facility] from home. Per report by medic, the pt had fallen out of bed this morning, sustained bruising and tear to the right elbow and left knee and center of back. He has dementia and is at baseline. He has a trach, on room air he was 90%, after suctioning of thick green mucous and placed on oxygen, sats improved to 96%
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
## 2021-08-19 NOTE — ED Notes (Signed)
Pt moved to hospital bed, primofit placed. Appears to be more comfortable

## 2021-08-19 NOTE — ED Notes (Signed)
Pt crawled out of bed again. Assisted back to bed

## 2021-08-19 NOTE — Progress Notes (Signed)
Pharmacy Antibiotic Note  Christopher Santiago is a 87 y.o. male admitted on 08/19/2021 with pneumonia.  Pharmacy has been consulted for vancomycin and cefepime dosing.  Plan: Vancomycin 1500mg  x1 then 1500mg  IV q36h (eAUC  416, Cr 0.92mg /dL) Cefepime 2g IV q12h based on renal function -Monitor renal function, clinical status, and antibiotic plan -Order vanc levels as necessary  Temp (24hrs), Avg:98.8 F (37.1 C), Min:98.8 F (37.1 C), Max:98.8 F (37.1 C)  Recent Labs  Lab 08/19/21 0856  WBC 17.2*  CREATININE 0.92    Estimated Creatinine Clearance: 50.1 mL/min (by C-G formula based on SCr of 0.92 mg/dL).    Allergies  Allergen Reactions   Tessalon [Benzonatate] Other (See Comments)    Ineffective, nasal congestion   Antimicrobials this admission: Vanc 9/27 >>  Cefepime 9/27 >>   Dose adjustments this admission: N/A  Microbiology results: 9/27 BCx:  9/24 Ucx with proteus >100k (pan-susp)  Thank you for allowing pharmacy to be a part of this patient's care.  Joetta Manners, PharmD, [facility] Emergency Medicine Clinical Pharmacist ED RPh Phone: [place]: [phone]

## 2021-08-19 NOTE — ED Notes (Signed)
Pt attempted to climb out of bed to use the bathroom. Pt able to use urinal with assistance. Assisted back to bed. Skin tears noted to upper back (mepaplex applied) and R elbow from frequently climbing out of bed

## 2021-08-19 NOTE — ED Notes (Signed)
ED Provider at bedside. 

## 2021-08-20 ENCOUNTER — Encounter: Payer: Self-pay | Admitting: Family Medicine

## 2021-08-20 LAB — PROCALCITONIN: Procalcitonin: <0.1 ng/mL

## 2021-08-20 LAB — BASIC METABOLIC PANEL
Anion gap: 6 (ref 5–15)
BUN: 31 mg/dL — ABNORMAL HIGH (ref 8–23)
CO2: 28 mmol/L (ref 22–32)
Calcium: 8.3 mg/dL — ABNORMAL LOW (ref 8.9–10.3)
Chloride: 94 mmol/L — ABNORMAL LOW (ref 98–111)
Creatinine, Ser: 0.78 mg/dL (ref 0.61–1.24)
GFR, Estimated: >60 mL/min (ref >60)
Glucose, Bld: 194 mg/dL — ABNORMAL HIGH (ref 70–99)
Potassium: 4.6 mmol/L (ref 3.5–5.1)
Sodium: 128 mmol/L — ABNORMAL LOW (ref 135–145)

## 2021-08-20 LAB — CBC
HCT: 26.6 % — ABNORMAL LOW (ref 39.0–52.0)
Hemoglobin: 8.2 g/dL — ABNORMAL LOW (ref 13.0–17.0)
MCH: 29.5 pg (ref 26.0–34.0)
MCHC: 30.8 g/dL (ref 30.0–36.0)
MCV: 95.7 fL (ref 80.0–100.0)
Platelets: 155 10*3/uL (ref 150–400)
RBC: 2.78 MIL/uL — ABNORMAL LOW (ref 4.22–5.81)
RDW: 13.9 % (ref 11.5–15.5)
WBC: 13.7 10*3/uL — ABNORMAL HIGH (ref 4.0–10.5)
nRBC: 0 % (ref 0.0–0.2)

## 2021-08-20 LAB — CBG MONITORING, ED
Glucose-Capillary: 106 mg/dL — ABNORMAL HIGH (ref 70–99)
Glucose-Capillary: 164 mg/dL — ABNORMAL HIGH (ref 70–99)

## 2021-08-20 LAB — LACTIC ACID, PLASMA: Lactic Acid, Venous: 1 mmol/L (ref 0.5–1.9)

## 2021-08-20 LAB — GLUCOSE, CAPILLARY: Glucose-Capillary: 100 mg/dL — ABNORMAL HIGH (ref 70–99)

## 2021-08-20 MED ORDER — IPRATROPIUM-ALBUTEROL 0.5-2.5 (3) MG/3ML IN SOLN
3.0000 mL | Freq: Four times a day (QID) | RESPIRATORY_TRACT | Status: DC
  Administered 2021-08-20 – 2021-08-21 (×6): 3 mL via RESPIRATORY_TRACT
  Filled 2021-08-20 (×6): qty 3

## 2021-08-20 MED ORDER — SCOPOLAMINE 1 MG/3DAYS TD PT72
1.0000 | MEDICATED_PATCH | TRANSDERMAL | Status: DC
  Filled 2021-08-20: qty 1

## 2021-08-20 MED ORDER — FREE WATER
100.0000 mL | Freq: Four times a day (QID) | Status: DC
  Administered 2021-08-20 – 2021-08-23 (×13): 100 mL

## 2021-08-20 MED ORDER — SODIUM CHLORIDE 0.9 % IV SOLN: INTRAVENOUS | Status: AC

## 2021-08-20 NOTE — Telephone Encounter (Signed)
I put in the referral.  Thanks.  

## 2021-08-20 NOTE — Progress Notes (Signed)
Pt. Currently on 60%. Pt. Fio2 was increased by Rn earlier in the shift do to pt.desating. pt. Tolerating well now with sats in the high 90's. RT will continue to wean fio2 accordingly.

## 2021-08-20 NOTE — ED Notes (Signed)
Anton [place]ie wife [phone] requesting an update on the patient

## 2021-08-20 NOTE — ED Notes (Signed)
Trach suctioned

## 2021-08-20 NOTE — ED Notes (Signed)
Message sent to pharmacy for robitussin

## 2021-08-20 NOTE — ED Notes (Addendum)
The patient's CBG was 106 mg/dL

## 2021-08-20 NOTE — Progress Notes (Signed)
Obese PROGRESS NOTE    Christopher Santiago  CVE:[id number] DOB: 12-09-1930 DOA: 08/19/2021 PCP: Tonia Ghent, MD   Brief Narrative:  87 y.o. male with history h/o COPD, type 2 diabetes mellitus, essential hypertension, laryngeal cancer ->recently had prolonged hospitalization 07/01/21-08/12/21 for multifactorial acute respiratory failure, treated for COPD exacerbation, aspiration pneumonia and also underwent tracheostomy by ENT for recurrent squamous cell laryngeal cancer-> was discharged home with instructions to use scheduled Brovana/budesonide/Yupleri nebs as well as as needed albuterol neb, trach care and oral suction frequently. Patient returned to the ED on 9/22 for dislodged tracheostomy tube, replaced and discharged home with antibiotics (Keflex) for UTI. patient with significant respiratory distress, wife states she tried to suction his trach but he had very thick secretions.  She states she did the suction tube and water and tried to suction again through the trach to make the secretions softer.  Ultimately patient was brought to the ER. He was seen by pulmonary team and swtiched to PO Abx.    Assessment & Plan:   Principal Problem:   Acute on chronic respiratory failure with hypoxia (HCC) Active Problems:   Gout   Essential hypertension   Coronary atherosclerosis   PVD (peripheral vascular disease) (HCC)   Mixed hyperlipidemia   Ascending aorta dilation (HCC)   Pulmonary hypertension, unspecified (HCC)   BPH with urinary obstruction   Tracheostomy status ([place])   Malignant neoplasm of supraglottis (HCC)   Dementia (HCC)   Tracheitis  Acute hypoxic respiratory failure, recurrent aspiration/tracheitis:  Tracheostomy replaced by pulmonary team.  IV antibiotics has been switched to oral cefdinir, will complete 7-day course.  He is improving.  Scopolamine patch has been ordered.  We will plan on discontinuing his home expensive bronchodilators, will.DIAG doing Scheduled and as  necessary.   COPD, obstructive sleep apnea, pulmonary hypertension:  -Continue as needed bronchodilators.    Acute metabolic encephalopathy, delirium with underlying dementia:  -Patient is arousable but drowsy.  Will discontinue bedtime trazodone, give melatonin only as needed.  Seroquel only for agitation.  Continue to monitor.  Falls: Seen by wound care team   CAD, PVD, HTN: Currently patient is chest pain-free, continue aspirin and Norvasc.   LVH, stage I diastolic dysfunction:  -No signs of hemodynamic instability.  Continue to monitor at this time.   Hyponatremia, adrenal adenoma:  -Likely from some dehydration.  Gentle hydration ordered with NS. Reduce the amount of free water.    Diabetes mellitus type 2:  -Not on any medications and hemoglobin A1c 5.8 while hospitalized recently.  Monitor blood glucose levels while on tube feeds.  Sliding scale insulin    History of DVT/PE:  -Not on any anticoagulation.  Noted history of duodenal ulcer with hemorrhage in 2021 and s/p IVC filter   Laryngeal mass, RUL pulmonary nodule:  -Follows ENT.  Now s/p trach.  Outpatient follow-up with oncology set up in last hospitalization.  Patient is refusing any further treatment, wife is interested in speaking with hospice services to explore that option.  I will consult TOC for home health services   Gout: - Resume allopurinol   BPH: - Resume home medications.   Duodenal ulcers : - Resume PPI.  No signs of GI bleed currently.       DVT prophylaxis: SCDs Code Status: DNR Family Communication:  Spoke with his wife   Status is: Inpatient  Remains inpatient appropriate because:Inpatient level of care appropriate due to severity of illness.  Patient is still drowsy this morning  Dispo: The patient is from: Home              Anticipated d/c is to: Home              Patient currently is not medically stable to d/c.   Difficult to place patient No       Nutritional status            There is no height or weight on file to calculate BMI.           Subjective: Patient had thick sputum production this morning during suctioning which improved thereafter.  He was drowsy during my visit but easily arousable without any focal neurodeficits.  Respiratory therapy is present at bedside getting ready to suction MDM.  I also spoke with the patient his wife who tells me that patient has been refusing any sort of cancer treatment and she is interested in discussing options with hospice care service  Review of Systems Otherwise negative except as per HPI, including: General: Denies fever, chills, night sweats or unintended weight loss. Resp: Denies cough, wheezing, shortness of breath. Cardiac: Denies chest pain, palpitations, orthopnea, paroxysmal nocturnal dyspnea. GI: Denies abdominal pain, nausea, vomiting, diarrhea or constipation GU: Denies dysuria, frequency, hesitancy or incontinence MS: Denies muscle aches, joint pain or swelling Neuro: Denies headache, neurologic deficits (focal weakness, numbness, tingling), abnormal gait Psych: Denies anxiety, depression, SI/HI/AVH Skin: Denies new rashes or lesions ID: Denies sick contacts, exotic exposures, travel  Examination:  General exam: Appears calm and comfortable  Respiratory system: Mild bilateral rhonchi Cardiovascular system: S1 & S2 heard, RRR. No JVD, murmurs, rubs, gallops or clicks. No pedal edema. Gastrointestinal system: Abdomen is nondistended, soft and nontender. No organomegaly or masses felt. Normal bowel sounds heard. Central nervous system: Alert and oriented. No focal neurological deficits. Extremities: Symmetric 5 x 5 power. Skin: No rashes, lesions or ulcers Psychiatry: Difficult to assess  Tracheostomy & Peg in place  Objective: Vitals:   08/20/21 1100 08/20/21 1132 08/20/21 1200 08/20/21 1219  BP: (!) 112/45 (!) 112/45 (!) 121/47   Pulse: 76 71 72   Resp: (!) 24 (!) 22 18   Temp:     99.2 F (37.3 C)  TempSrc:    Axillary  SpO2: 99% 95% 95%     Intake/Output Summary (Last 24 hours) at 08/20/2021 1225 Last data filed at 08/20/2021 0347 Gross per 24 hour  Intake 1800 ml  Output --  Net 1800 ml   There were no vitals filed for this visit.   Data Reviewed:   CBC: Recent Labs  Lab 08/19/21 0856 08/20/21 0140  WBC 17.2* 13.7*  NEUTROABS 14.9*  --   HGB 10.8* 8.2*  HCT 34.0* 26.6*  MCV 94.4 95.7  PLT 193 412   Basic Metabolic Panel: Recent Labs  Lab 08/19/21 0856 08/20/21 0140  NA 130* 128*  K 4.8 4.6  CL 91* 94*  CO2 31 28  GLUCOSE 156* 194*  BUN 27* 31*  CREATININE 0.92 0.78  CALCIUM 9.0 8.3*   GFR: Estimated Creatinine Clearance: 57.6 mL/min (by C-G formula based on SCr of 0.78 mg/dL). Liver Function Tests: No results for input(s): AST, ALT, ALKPHOS, BILITOT, PROT, ALBUMIN in the last 168 hours. No results for input(s): LIPASE, AMYLASE in the last 168 hours. No results for input(s): AMMONIA in the last 168 hours. Coagulation Profile: No results for input(s): INR, PROTIME in the last 168 hours. Cardiac Enzymes: No results for input(s): CKTOTAL, CKMB, CKMBINDEX, TROPONINI  in the last 168 hours. BNP (last 3 results) Recent Labs    11/08/20 1106  PROBNP 482.0*   HbA1C: No results for input(s): HGBA1C in the last 72 hours. CBG: Recent Labs  Lab 08/19/21 2157 08/20/21 0833 08/20/21 1203  GLUCAP 136* 106* 164*   Lipid Profile: No results for input(s): CHOL, HDL, LDLCALC, TRIG, CHOLHDL, LDLDIRECT in the last 72 hours. Thyroid Function Tests: No results for input(s): TSH, T4TOTAL, FREET4, T3FREE, THYROIDAB in the last 72 hours. Anemia Panel: No results for input(s): VITAMINB12, FOLATE, FERRITIN, TIBC, IRON, RETICCTPCT in the last 72 hours. Sepsis Labs: Recent Labs  Lab 08/19/21 1930 08/20/21 0135  LATICACIDVEN 3.9* 1.0    Recent Results (from the past 240 hour(s))  Urine Culture     Status: Abnormal   Collection Time:  08/14/21  2:52 PM   Specimen: Urine, Clean Catch  Result Value Ref Range Status   Specimen Description URINE, CLEAN CATCH  Final   Special Requests   Final    NONE Performed at [facility], [place] [address], [place], [place] [address]    Culture >=100,000 COLONIES/mL PROTEUS MIRABILIS (A)  Final   Report Status 08/16/2021 FINAL  Final   Organism ID, Bacteria PROTEUS MIRABILIS (A)  Final      Susceptibility   Proteus mirabilis - MIC*    AMPICILLIN <=2 SENSITIVE Sensitive     CEFAZOLIN 8 SENSITIVE Sensitive     CEFEPIME <=0.12 SENSITIVE Sensitive     CEFTRIAXONE <=0.25 SENSITIVE Sensitive     CIPROFLOXACIN <=0.25 SENSITIVE Sensitive     GENTAMICIN <=1 SENSITIVE Sensitive     IMIPENEM 4 SENSITIVE Sensitive     NITROFURANTOIN 128 RESISTANT Resistant     TRIMETH/SULFA <=20 SENSITIVE Sensitive     AMPICILLIN/SULBACTAM <=2 SENSITIVE Sensitive     PIP/TAZO <=4 SENSITIVE Sensitive     * >=100,000 COLONIES/mL PROTEUS MIRABILIS  Culture, blood (routine x 2)     Status: None (Preliminary result)   Collection Time: 08/19/21 12:30 PM   Specimen: BLOOD  Result Value Ref Range Status   Specimen Description BLOOD LEFT ANTECUBITAL  Final   Special Requests   Final    BOTTLES DRAWN AEROBIC AND ANAEROBIC Blood Culture adequate volume   Culture   Final    NO GROWTH < 24 HOURS Performed at [facility], [address] [place], [place] [address]    Report Status PENDING  Incomplete  Culture, blood (routine x 2)     Status: None (Preliminary result)   Collection Time: 08/19/21 12:41 PM   Specimen: BLOOD  Result Value Ref Range Status   Specimen Description BLOOD RIGHT ANTECUBITAL  Final   Special Requests   Final    BOTTLES DRAWN AEROBIC AND ANAEROBIC Blood Culture results may not be optimal due to an excessive volume of blood received in culture bottles   Culture   Final    NO GROWTH < 24 HOURS Performed at [facility], [place] [address] [place],  [address]    Report  Status PENDING  Incomplete  Resp Panel by RT-PCR (Flu A&B, Covid) Nasopharyngeal Swab     Status: None   Collection Time: 08/19/21  3:04 PM   Specimen: Nasopharyngeal Swab; Nasopharyngeal(NP) swabs in vial transport medium  Result Value Ref Range Status   SARS Coronavirus 2 by RT PCR NEGATIVE NEGATIVE Final    Comment: (NOTE) SARS-CoV-2 target nucleic acids are NOT DETECTED.  The SARS-CoV-2 RNA is generally detectable in  upper respiratory specimens during the acute phase of infection. The lowest concentration of SARS-CoV-2 viral copies this assay can detect is 138 copies/mL. A negative result does not preclude SARS-Cov-2 infection and should not be used as the sole basis for treatment or other patient management decisions. A negative result may occur with  improper specimen collection/handling, submission of specimen other than nasopharyngeal swab, presence of viral mutation(s) within the areas targeted by this assay, and inadequate number of viral copies(<138 copies/mL). A negative result must be combined with clinical observations, patient history, and epidemiological information. The expected result is Negative.  Fact Sheet for Patients:  EntrepreneurPulse.com.au  Fact Sheet for Healthcare Providers:  IncredibleEmployment.be  This test is no t yet approved or cleared by the [place] FDA and  has been authorized for detection and/or diagnosis of SARS-CoV-2 by FDA under an Emergency Use Authorization (EUA). This EUA will remain  in effect (meaning this test can be used) for the duration of the COVID-19 declaration under Section 564(b)(1) of the Act, 21 U.S.C.section 360bbb-3(b)(1), unless the authorization is terminated  or revoked sooner.       Influenza A by PCR NEGATIVE NEGATIVE Final   Influenza B by PCR NEGATIVE NEGATIVE Final    Comment: (NOTE) The Xpert Xpress SARS-CoV-2/FLU/RSV plus assay is intended as an aid in the diagnosis  of influenza from Nasopharyngeal swab specimens and should not be used as a sole basis for treatment. Nasal washings and aspirates are unacceptable for Xpert Xpress SARS-CoV-2/FLU/RSV testing.  Fact Sheet for Patients: EntrepreneurPulse.com.au  Fact Sheet for Healthcare Providers: IncredibleEmployment.be  This test is not yet approved or cleared by the [place] FDA and has been authorized for detection and/or diagnosis of SARS-CoV-2 by FDA under an Emergency Use Authorization (EUA). This EUA will remain in effect (meaning this test can be used) for the duration of the COVID-19 declaration under Section 564(b)(1) of the Act, 21 U.S.C. section 360bbb-3(b)(1), unless the authorization is terminated or revoked.  Performed at [facility], [place] [address], [place], [place] [address]          Radiology Studies: DG Chest 2 View  Result Date: 08/19/2021 CLINICAL DATA:  Hypoxia. EXAM: CHEST - 2 VIEW COMPARISON:  August 01, 2021. FINDINGS: The heart size and mediastinal contours are within normal limits. Both lungs are clear. The visualized skeletal structures are unremarkable. IMPRESSION: No active cardiopulmonary disease. Electronically Signed   By: Marijo Conception M.D.   On: 08/19/2021 09:27   DG Thoracic Spine 2 View  Result Date: 08/19/2021 CLINICAL DATA:  Fall. EXAM: THORACIC SPINE 2 VIEWS COMPARISON:  February 10, 2016.  July 10, 2021. FINDINGS: Old T12 compression fracture is noted. Old T8 compression fracture is noted. No definite acute fracture or spondylolisthesis is noted. Anterior osteophyte formation is noted at multiple levels in the lower thoracic spine. IMPRESSION: Old T8 and T12 fractures.  No definite acute abnormality is noted. Electronically Signed   By: Marijo Conception M.D.   On: 08/19/2021 09:26   DG Elbow Complete Left  Result Date: 08/19/2021 CLINICAL DATA:  Fall, 87 year old male. EXAM: LEFT ELBOW - COMPLETE 3+ VIEW  COMPARISON:  Contralateral elbow of the same date. FINDINGS: There is no evidence of fracture, dislocation, or joint effusion. There is no evidence of arthropathy or other focal bone abnormality. Soft tissues are unremarkable. IMPRESSION: Negative evaluation of the LEFT elbow. Electronically Signed   By: Zetta Bills M.D.   On: 08/19/2021 09:21   DG  Elbow Complete Right  Result Date: 08/19/2021 CLINICAL DATA:  Right elbow pain after fall. EXAM: RIGHT ELBOW - COMPLETE 3+ VIEW COMPARISON:  November 18, 2010. FINDINGS: There is no evidence of fracture, dislocation, or joint effusion. There is no evidence of arthropathy or other focal bone abnormality. Soft tissues are unremarkable. IMPRESSION: Negative. Electronically Signed   By: Marijo Conception M.D.   On: 08/19/2021 09:23   CT HEAD WO CONTRAST (5MM)  Result Date: 08/19/2021 CLINICAL DATA:  Head trauma, minor (Age >= 65y) fall.  Fall. EXAM: CT HEAD WITHOUT CONTRAST TECHNIQUE: Contiguous axial images were obtained from the base of the skull through the vertex without intravenous contrast. COMPARISON:  Maxillofacial and neck CTs 07/02/2021. Head CT 08/29/2019. FINDINGS: The study is mildly motion degraded despite repeat imaging. Brain: Within limitations of motion, no acute infarct, intracranial hemorrhage, mass, midline shift, or extra-axial fluid collection is identified. A small chronic cortical infarct in the left temporo-occipital region is unchanged. There is mild cerebral atrophy. Hypodensities in the cerebral white matter bilaterally are stable to slightly increased from 2020 and are nonspecific but compatible with mild chronic small vessel ischemic disease. Vascular: Calcified atherosclerosis at the skull base. No hyperdense vessel. Skull: No acute fracture or suspicious osseous lesion. Sinuses/Orbits: Remote medial left orbital fracture. Bilateral cataract extraction. Paranasal sinuses and mastoid air cells are clear. Other: None. IMPRESSION: 1. No  evidence of acute intracranial abnormality. 2. Mild chronic small vessel ischemic disease. Electronically Signed   By: Logan Bores M.D.   On: 08/19/2021 07:55   DG Knee Complete 4 Views Left  Result Date: 08/19/2021 CLINICAL DATA:  Fall EXAM: LEFT KNEE - COMPLETE 4+ VIEW COMPARISON:  None. FINDINGS: There is no acute fracture or dislocation. Bony alignment is normal. There is mild medial tibiofemoral joint space narrowing. There is no effusion. There are extensive vascular calcifications. IMPRESSION: No acute fracture or dislocation. Electronically Signed   By: Valetta Mole M.D.   On: 08/19/2021 09:22        Scheduled Meds:  allopurinol  100 mg Per Tube Daily   amLODipine  5 mg Per Tube Daily   aspirin  81 mg Per Tube Daily   budesonide  0.25 mg Nebulization BID   cefdinir  300 mg Oral Q12H   citalopram  60 mg Per Tube Daily   feeding supplement (OSMOLITE 1.5 CAL)  355 mL Per Tube TID WC & HS   free water  200 mL Per Tube QID   guaiFENesin  10 mL Per Tube Q8H   insulin aspart  0-9 Units Subcutaneous TID WC   ipratropium-albuterol  3 mL Nebulization Q6H   melatonin  5 mg Oral QHS   pantoprazole sodium  40 mg Per Tube BID   scopolamine  1 patch Transdermal Q72H   senna-docusate  1 tablet Per Tube BID   Continuous Infusions:   LOS: 1 day   Time spent= 35 mins    Sherise Geerdes Arsenio Loader, MD [facility]  If 7PM-7AM, please contact night-coverage  08/20/2021, 12:25 PM

## 2021-08-20 NOTE — Addendum Note (Signed)
Addended by: Tonia Ghent on: 08/20/2021 02:01 PM   Modules accepted: Orders

## 2021-08-20 NOTE — Progress Notes (Signed)
NEW ADMISSION NOTE New Admission Note:   Arrival Method: ED bed Mental Orientation: AAOx1 Telemetry: 5M10  Assessment: Completed Skin: multiple skin tears and bruising see assessment IV: RUA Pain: n/a Tubes: tracheostomy Safety Measures: Safety Fall Prevention Plan has been given, discussed and signed Admission: Completed [facility] Orientation: Patient has been orientated to the room, unit and staff.  Family: none at bedside  Orders have been reviewed and implemented. Will continue to monitor the patient. Call light has been placed within reach and bed alarm has been activated.   Vira Agar, RN

## 2021-08-20 NOTE — Consult Note (Signed)
WOC Nurse Consult Note: Patient receiving care in Wing Reason for Consult: Skin tears from fall Wound type: multiple areas of bruising and skin tears from a fall. Left above the knee tear with skin flap present, dried up blood present. Left above the elbow skin tear without skin flap and has dried blood on it. Very fragile thin skin.  Pressure Injury POA: NA Drainage (amount, consistency, odor) None Dressing procedure/placement/frequency: After bathing each day, dry the skin and apply Sween moisturizing lotion (pink top in clean supply) over the body to keep the skin moist.  Apply a small piece of Xeroform gauze over the skin tear, left above the knee and left above the elbow, then secure with a foam dressing. Change Xeroform gauze daily. Use paper tape where possible and avoid tape when feasible.  Place bilateral feet in Prevalon Heel Lift Boots. Turn patient q2h.  Monitor the wound area(s) for worsening of condition such as: Signs/symptoms of infection, increase in size, development of or worsening of odor, development of pain, or increased pain at the affected locations.   Notify the medical team if any of these develop.  Pressure Injury Prevention Bundle May use any that apply to this patient. Support surfaces (air mattress) chair cushion Kellie Simmering # [phone]) Heel offloading boots Kellie Simmering # [phone]) Turning and Positioning  Measures to reduce shear (draw sheet, knees up) Skin protection Products (Foam dressing) Moisture management products (Critic-Aid Barrier Cream (Purple top) Sween moisturizing lotion (Pink top in clean supply) Nutrition Management Protection for Medical Devices Routine Skin Assessment  Thank you for the consult. [place] nurse will not follow at this time.   Please re-consult the [place] team if needed.  Cathlean Marseilles Tamala Julian, MSN, RN, [place], Lysle Pearl, [facility] Wound Treatment Associate Pager [phone]

## 2021-08-20 NOTE — Progress Notes (Signed)
[facility])      This patient was referred by his PCP, Dr. Elsie Stain, for hospice services at home. TOC aware.    ACC will continue to follow for any discharge planning needs and to coordinate admission to hospice services.    Thank you for the opportunity to participate in this patient's care.     Domenic Moras, BSN, RN [facility] 2 Center Liaison [phone] (24h on call)

## 2021-08-21 ENCOUNTER — Inpatient Hospital Stay (HOSPITAL_COMMUNITY): Payer: Medicare Other

## 2021-08-21 LAB — BASIC METABOLIC PANEL
Anion gap: 10 (ref 5–15)
BUN: 21 mg/dL (ref 8–23)
CO2: 28 mmol/L (ref 22–32)
Calcium: 8.6 mg/dL — ABNORMAL LOW (ref 8.9–10.3)
Chloride: 94 mmol/L — ABNORMAL LOW (ref 98–111)
Creatinine, Ser: 0.75 mg/dL (ref 0.61–1.24)
GFR, Estimated: >60 mL/min (ref >60)
Glucose, Bld: 101 mg/dL — ABNORMAL HIGH (ref 70–99)
Potassium: 4.7 mmol/L (ref 3.5–5.1)
Sodium: 132 mmol/L — ABNORMAL LOW (ref 135–145)

## 2021-08-21 LAB — CBC
HCT: 31.4 % — ABNORMAL LOW (ref 39.0–52.0)
Hemoglobin: 9.7 g/dL — ABNORMAL LOW (ref 13.0–17.0)
MCH: 29.5 pg (ref 26.0–34.0)
MCHC: 30.9 g/dL (ref 30.0–36.0)
MCV: 95.4 fL (ref 80.0–100.0)
Platelets: 184 10*3/uL (ref 150–400)
RBC: 3.29 MIL/uL — ABNORMAL LOW (ref 4.22–5.81)
RDW: 14.1 % (ref 11.5–15.5)
WBC: 12.7 10*3/uL — ABNORMAL HIGH (ref 4.0–10.5)
nRBC: 0 % (ref 0.0–0.2)

## 2021-08-21 LAB — GLUCOSE, CAPILLARY
Glucose-Capillary: 102 mg/dL — ABNORMAL HIGH (ref 70–99)
Glucose-Capillary: 112 mg/dL — ABNORMAL HIGH (ref 70–99)
Glucose-Capillary: 174 mg/dL — ABNORMAL HIGH (ref 70–99)
Glucose-Capillary: 176 mg/dL — ABNORMAL HIGH (ref 70–99)
Glucose-Capillary: 200 mg/dL — ABNORMAL HIGH (ref 70–99)

## 2021-08-21 LAB — MAGNESIUM: Magnesium: 2.1 mg/dL (ref 1.7–2.4)

## 2021-08-21 MED ORDER — GLYCOPYRROLATE 1 MG PO TABS
1.0000 mg | ORAL_TABLET | Freq: Three times a day (TID) | ORAL | Status: DC
  Administered 2021-08-21 – 2021-08-23 (×6): 1 mg
  Filled 2021-08-21 (×6): qty 1

## 2021-08-21 MED ORDER — GLYCOPYRROLATE 1 MG PO TABS: 1.0000 mg | ORAL_TABLET | Freq: Two times a day (BID) | ORAL | Status: DC

## 2021-08-21 MED ORDER — CHLORHEXIDINE GLUCONATE 0.12 % MT SOLN
15.0000 mL | Freq: Two times a day (BID) | OROMUCOSAL | Status: DC
  Administered 2021-08-21 – 2021-08-23 (×6): 15 mL via OROMUCOSAL
  Filled 2021-08-21 (×5): qty 15

## 2021-08-21 MED ORDER — ORAL CARE MOUTH RINSE
15.0000 mL | Freq: Two times a day (BID) | OROMUCOSAL | Status: DC
  Administered 2021-08-21 – 2021-08-23 (×6): 15 mL via OROMUCOSAL

## 2021-08-21 NOTE — Progress Notes (Signed)
[facility])   This patient was referred to [facility] by his PCP, [place] for hospice at discharge. Patients chart is under review for eligibility. TOC aware.   Spoke to wife [name] to initiate to initiate education related to hospice philosophy, services and team approach to care.  [name] verbalized understanding of information given. Per discussion this patient will discharge once medically stable by [facility].    Please send signed and completed DNR form home with patient/family.     Please provide prescriptions for mediations at discharge to ensure comfort until patient can be admitted onto hospice services.   DME ordered: Oxygen 10L with humidifier, suction canister, and trach care. This should be delivered Friday.  ACC numbers given to [facility].   Please call with any hospice related questions.  Thank you, Clementeen Hoof, BSN, [facility] [phone]

## 2021-08-21 NOTE — Progress Notes (Signed)
Obese PROGRESS NOTE    Christopher Santiago  HBZ:[id number] DOB: 29-Dec-1930 DOA: 08/19/2021 PCP: Tonia Ghent, MD   Brief Narrative:  87 y.o. male with history h/o COPD, type 2 diabetes mellitus, essential hypertension, laryngeal cancer ->recently had prolonged hospitalization 07/01/21-08/12/21 for multifactorial acute respiratory failure, treated for COPD exacerbation, aspiration pneumonia and also underwent tracheostomy by ENT for recurrent squamous cell laryngeal cancer-> was discharged home with instructions to use scheduled Brovana/budesonide/Yupleri nebs as well as as needed albuterol neb, trach care and oral suction frequently. Patient returned to the ED on 9/22 for dislodged tracheostomy tube, replaced and discharged home with antibiotics (Keflex) for UTI. patient with significant respiratory distress, wife states she tried to suction his trach but he had very thick secretions.  She states she did the suction tube and water and tried to suction again through the trach to make the secretions softer.  Ultimately patient was brought to the ER. He was seen by pulmonary team and swtiched to PO Abx.    Assessment & Plan:   Principal Problem:   Acute on chronic respiratory failure with hypoxia (HCC) Active Problems:   Gout   Essential hypertension   Coronary atherosclerosis   PVD (peripheral vascular disease) (HCC)   Mixed hyperlipidemia   Ascending aorta dilation (HCC)   Pulmonary hypertension, unspecified (HCC)   BPH with urinary obstruction   Tracheostomy status ([place])   Malignant neoplasm of supraglottis (HCC)   Dementia (HCC)   Tracheitis  Acute hypoxic respiratory failure, recurrent aspiration/tracheitis:  Tracheostomy replaced by pulmonary team.  IV antibiotics has been switched to oral cefdinir, will complete 7-day course.  Still having significant amount of secretions.  Scopolamine patch discontinued.  DuoNebs scheduled and as needed.   COPD, obstructive sleep apnea, pulmonary  hypertension:  -Continue as needed bronchodilators.   Acute metabolic encephalopathy, delirium with underlying dementia:  - Doing better.  On melatonin as needed at bedtime and Seroquel for agitation.  Falls: Seen by wound care team   CAD, PVD, HTN: Currently patient is chest pain-free, continue aspirin and Norvasc.   LVH, stage I diastolic dysfunction:  -No signs of hemodynamic instability.  Continue to monitor at this time.   Hyponatremia, adrenal adenoma:  - Improved with fluids.   Diabetes mellitus type 2:  -Not on any medications and hemoglobin A1c 5.8 while hospitalized recently.  Monitor blood glucose levels while on tube feeds.  Sliding scale insulin    History of DVT/PE:  -Not on any anticoagulation.  Noted history of duodenal ulcer with hemorrhage in 2021 and s/p IVC filter   Laryngeal mass, RUL pulmonary nodule:  -Follows ENT.  Now s/p trach.  Outpatient follow-up with oncology set up in last hospitalization.  Hospitalist team has been consulted   Gout: - Resume allopurinol   BPH: - Resume home medications.   Duodenal ulcers : - Resume PPI.  No signs of GI bleed currently.       DVT prophylaxis: SCDs Code Status: DNR Family Communication:   Wife updated.   Status is: Inpatient  Remains inpatient appropriate because:Inpatient level of care appropriate due to severity of illness.  Patient is still drowsy this morning  Dispo: The patient is from: Home              Anticipated d/c is to: Home              Patient currently is not medically stable to d/c.   Difficult to place patient No  Subjective: Still having significant amount of of secretions requiring freq suction.   Review of Systems Otherwise negative except as per HPI, including: General = no fevers, chills, dizziness,  fatigue HEENT/EYES = negative for loss of vision, double vision, blurred vision,  sore throa Cardiovascular= negative for chest pain, palpitation Respiratory/lungs=  negative for shortness of breath, cough, wheezing; hemoptysis,  Gastrointestinal= negative for nausea, vomiting, abdominal pain Genitourinary= negative for Dysuria MSK = Negative for arthralgia, myalgias Neurology= Negative for headache, numbness, tingling  Psychiatry= Negative for suicidal and homocidal ideation Skin= Negative for Rash   Examination:  Constitutional: Not in acute distress Respiratory: b/l rhonchi.  Cardiovascular: Normal sinus rhythm, no rubs Abdomen: Nontender nondistended good bowel sounds Musculoskeletal: No edema noted Skin: No rashes seen Neurologic: CN 2-12 grossly intact.  And nonfocal Psychiatric: Normal judgment and insight. Alert and oriented x 3. Normal mood.    Tracheostomy & Peg in place  Objective: Vitals:   08/20/21 2035 08/21/21 0547 08/21/21 0819 08/21/21 0929  BP:  (!) 140/57  (!) 147/63  Pulse:  66 99 74  Resp:  18 18 20   Temp:  97.7 F (36.5 C)  98 F (36.7 C)  TempSrc:  Oral  Oral  SpO2: 94% 96% 98% 99%    Intake/Output Summary (Last 24 hours) at 08/21/2021 1008 Last data filed at 08/21/2021 4709 Gross per 24 hour  Intake 1135.22 ml  Output 900 ml  Net 235.22 ml   There were no vitals filed for this visit.   Data Reviewed:   CBC: Recent Labs  Lab 08/19/21 0856 08/20/21 0140 08/21/21 0614  WBC 17.2* 13.7* 12.7*  NEUTROABS 14.9*  --   --   HGB 10.8* 8.2* 9.7*  HCT 34.0* 26.6* 31.4*  MCV 94.4 95.7 95.4  PLT 193 155 628   Basic Metabolic Panel: Recent Labs  Lab 08/19/21 0856 08/20/21 0140 08/21/21 0614  NA 130* 128* 132*  K 4.8 4.6 4.7  CL 91* 94* 94*  CO2 31 28 28   GLUCOSE 156* 194* 101*  BUN 27* 31* 21  CREATININE 0.92 0.78 0.75  CALCIUM 9.0 8.3* 8.6*  MG  --   --  2.1   GFR: Estimated Creatinine Clearance: 57.6 mL/min (by C-G formula based on SCr of 0.75 mg/dL). Liver Function Tests: No results for input(s): AST, ALT, ALKPHOS, BILITOT, PROT, ALBUMIN in the last 168 hours. No results for input(s):  LIPASE, AMYLASE in the last 168 hours. No results for input(s): AMMONIA in the last 168 hours. Coagulation Profile: No results for input(s): INR, PROTIME in the last 168 hours. Cardiac Enzymes: No results for input(s): CKTOTAL, CKMB, CKMBINDEX, TROPONINI in the last 168 hours. BNP (last 3 results) Recent Labs    11/08/20 1106  PROBNP 482.0*   HbA1C: No results for input(s): HGBA1C in the last 72 hours. CBG: Recent Labs  Lab 08/20/21 0833 08/20/21 1203 08/20/21 1631 08/21/21 0043 08/21/21 0629  GLUCAP 106* 164* 100* 176* 102*   Lipid Profile: No results for input(s): CHOL, HDL, LDLCALC, TRIG, CHOLHDL, LDLDIRECT in the last 72 hours. Thyroid Function Tests: No results for input(s): TSH, T4TOTAL, FREET4, T3FREE, THYROIDAB in the last 72 hours. Anemia Panel: No results for input(s): VITAMINB12, FOLATE, FERRITIN, TIBC, IRON, RETICCTPCT in the last 72 hours. Sepsis Labs: Recent Labs  Lab 08/19/21 1930 08/20/21 0135 08/20/21 0140  PROCALCITON  --   --  <0.10  LATICACIDVEN 3.9* 1.0  --     Recent Results (from the past 240 hour(s))  Urine  Culture     Status: Abnormal   Collection Time: 08/14/21  2:52 PM   Specimen: Urine, Clean Catch  Result Value Ref Range Status   Specimen Description URINE, CLEAN CATCH  Final   Special Requests   Final    NONE Performed at [facility], [place] [address], [place], [place] [address]    Culture >=100,000 COLONIES/mL PROTEUS MIRABILIS (A)  Final   Report Status 08/16/2021 FINAL  Final   Organism ID, Bacteria PROTEUS MIRABILIS (A)  Final      Susceptibility   Proteus mirabilis - MIC*    AMPICILLIN <=2 SENSITIVE Sensitive     CEFAZOLIN 8 SENSITIVE Sensitive     CEFEPIME <=0.12 SENSITIVE Sensitive     CEFTRIAXONE <=0.25 SENSITIVE Sensitive     CIPROFLOXACIN <=0.25 SENSITIVE Sensitive     GENTAMICIN <=1 SENSITIVE Sensitive     IMIPENEM 4 SENSITIVE Sensitive     NITROFURANTOIN 128 RESISTANT Resistant     TRIMETH/SULFA <=20  SENSITIVE Sensitive     AMPICILLIN/SULBACTAM <=2 SENSITIVE Sensitive     PIP/TAZO <=4 SENSITIVE Sensitive     * >=100,000 COLONIES/mL PROTEUS MIRABILIS  Culture, blood (routine x 2)     Status: None (Preliminary result)   Collection Time: 08/19/21 12:30 PM   Specimen: BLOOD  Result Value Ref Range Status   Specimen Description BLOOD LEFT ANTECUBITAL  Final   Special Requests   Final    BOTTLES DRAWN AEROBIC AND ANAEROBIC Blood Culture adequate volume   Culture   Final    NO GROWTH < 24 HOURS Performed at [facility], [address] [place], [place] [address]    Report Status PENDING  Incomplete  Culture, blood (routine x 2)     Status: None (Preliminary result)   Collection Time: 08/19/21 12:41 PM   Specimen: BLOOD  Result Value Ref Range Status   Specimen Description BLOOD RIGHT ANTECUBITAL  Final   Special Requests   Final    BOTTLES DRAWN AEROBIC AND ANAEROBIC Blood Culture results may not be optimal due to an excessive volume of blood received in culture bottles   Culture   Final    NO GROWTH < 24 HOURS Performed at [facility], [place] [address], [place], [place] [address]    Report Status PENDING  Incomplete  Resp Panel by RT-PCR (Flu A&B, Covid) Nasopharyngeal Swab     Status: None   Collection Time: 08/19/21  3:04 PM   Specimen: Nasopharyngeal Swab; Nasopharyngeal(NP) swabs in vial transport medium  Result Value Ref Range Status   SARS Coronavirus 2 by RT PCR NEGATIVE NEGATIVE Final    Comment: (NOTE) SARS-CoV-2 target nucleic acids are NOT DETECTED.  The SARS-CoV-2 RNA is generally detectable in upper respiratory specimens during the acute phase of infection. The lowest concentration of SARS-CoV-2 viral copies this assay can detect is 138 copies/mL. A negative result does not preclude SARS-Cov-2 infection and should not be used as the sole basis for treatment or other patient management decisions. A negative result may occur with  improper specimen  collection/handling, submission of specimen other than nasopharyngeal swab, presence of viral mutation(s) within the areas targeted by this assay, and inadequate number of viral copies(<138 copies/mL). A negative result must be combined with clinical observations, patient history, and epidemiological information. The expected result is Negative.  Fact Sheet for Patients:  EntrepreneurPulse.com.au  Fact Sheet for Healthcare Providers:  IncredibleEmployment.be  This test is no t yet approved or cleared by  the [employer] and  has been authorized for detection and/or diagnosis of SARS-CoV-2 by FDA under an Emergency Use Authorization (EUA). This EUA will remain  in effect (meaning this test can be used) for the duration of the COVID-19 declaration under Section 564(b)(1) of the Act, 21 U.S.C.section 360bbb-3(b)(1), unless the authorization is terminated  or revoked sooner.       Influenza A by PCR NEGATIVE NEGATIVE Final   Influenza B by PCR NEGATIVE NEGATIVE Final    Comment: (NOTE) The Xpert Xpress SARS-CoV-2/FLU/RSV plus assay is intended as an aid in the diagnosis of influenza from Nasopharyngeal swab specimens and should not be used as a sole basis for treatment. Nasal washings and aspirates are unacceptable for Xpert Xpress SARS-CoV-2/FLU/RSV testing.  Fact Sheet for Patients: EntrepreneurPulse.com.au  Fact Sheet for Healthcare Providers: IncredibleEmployment.be  This test is not yet approved or cleared by the [place] FDA and has been authorized for detection and/or diagnosis of SARS-CoV-2 by FDA under an Emergency Use Authorization (EUA). This EUA will remain in effect (meaning this test can be used) for the duration of the COVID-19 declaration under Section 564(b)(1) of the Act, 21 U.S.C. section 360bbb-3(b)(1), unless the authorization is terminated or revoked.  Performed at [facility], [place] [address] [place], [place] [address]   Culture, Respiratory w Gram Stain     Status: None (Preliminary result)   Collection Time: 08/20/21 10:44 AM   Specimen: Tracheal Aspirate; Respiratory  Result Value Ref Range Status   Specimen Description TRACHEAL ASPIRATE  Final   Special Requests NONE  Final   Gram Stain   Final    MODERATE SQUAMOUS EPITHELIAL CELLS PRESENT ABUNDANT WBC PRESENT, PREDOMINANTLY PMN MODERATE GRAM POSITIVE COCCI MODERATE GRAM NEGATIVE RODS RARE GRAM POSITIVE RODS    Culture   Final    CULTURE REINCUBATED FOR BETTER GROWTH Performed at [facility], [place] [address] [place], [place] [address]    Report Status PENDING  Incomplete         Radiology Studies: No results found.   Scheduled Meds:  allopurinol  100 mg Per Tube Daily   amLODipine  5 mg Per Tube Daily   aspirin  81 mg Per Tube Daily   budesonide  0.25 mg Nebulization BID   cefdinir  300 mg Oral Q12H   citalopram  60 mg Per Tube Daily   feeding supplement (OSMOLITE 1.5 CAL)  355 mL Per Tube TID WC & HS   free water  100 mL Per Tube QID   guaiFENesin  10 mL Per Tube Q8H   insulin aspart  0-9 Units Subcutaneous TID WC   ipratropium-albuterol  3 mL Nebulization Q6H   melatonin  5 mg Oral QHS   pantoprazole sodium  40 mg Per Tube BID   senna-docusate  1 tablet Per Tube BID   Continuous Infusions:  sodium chloride 75 mL/hr at 08/21/21 0157     LOS: 2 days   Time spent= 35 mins    Saksham Akkerman Arsenio Loader, MD [facility]  If 7PM-7AM, please contact night-coverage  08/21/2021, 10:08 AM

## 2021-08-21 NOTE — TOC Initial Note (Signed)
Transition of Care [facility] - Initial/Assessment Note    Patient Details  Name: Christopher Santiago MRN: [id number] Date of Birth: 07/26/1931  Transition of [facility]) CM/SW Contact:    Tom-Johnson, Renea Ee, RN Phone Number: 08/21/2021, 1:33 PM  Clinical Narrative:                 CM consulted for needs for post hospital transition. Unable to speak with patient as he has a trach and sleeping. CM called wife, [name] and spoke with her. Patient recently discharged from the hospital to home with trach. [name] states she is disabled and cannot care for patient at home. Complained of having difficulties with trach supplies and patient having thick mucus that was hard to suction. CM called Zack with [employer] [phone]) to verify supplies. Patient was active with [facility]. Wife states no one paid a visit to the home. CM called Levada Dy with [facility] [phone])  to clarify and she states RN went out to assess patient after patient was discharged home but could not do much as patient was not feeling well and trach supplies had not been delivered yet. RN only made one visit. Wife requesting patient go home with Hospice. CM notified Clementeen Hoof, Liaison via secure chat to call and speak with wife. Melissa responded that she has spoken with wife and Hospice will provide trach supplies. States wife will have to pay out of pocket for PT/OT/RN/Aide/Respiratory therapist. Wife will call the home health agency she uses as she is disabled and gets home health services. Patient is not medically ready for discharge. CM will continue to follow with needs.     Barriers to Discharge: Continued Medical Work up   Patient Goals and CMS Choice Patient states their goals for this hospitalization and ongoing recovery are:: To go home with Hospice      Expected Discharge Plan and Services     Discharge Planning Services: CM Consult Post Acute Care Choice: Hospice Living arrangements for the past 2  months: Single Family Home                   DME Agency: [employer] Date DME Agency Contacted: 08/21/21 Time DME Agency Contacted: 86 Representative spoke with at DME Agency: [place]: RN, PT, OT, Nurse's Aide, Respirator Therapy, Speech Therapy HH Agency: [place] Date [place]: 08/21/21 Time [place]: 4 Representative spoke with at [place]: [place] Arrangements/Services Living arrangements for the past 2 months: [place] with:: Spouse Patient language and need for interpreter reviewed:: Yes Do you feel safe going back to the place where you live?: Yes      Need for Family Participation in Patient Care: Yes (Comment) Care giver support system in place?: Yes (comment) Current home services: DME, Home RT, Home OT, Home PT, Home RN (Hospital bed, bedside commode, trach supplies, suction machine,) Criminal Activity/Legal Involvement Pertinent to Current Situation/Hospitalization: No - Comment as needed  Activities of Daily Living      Permission Sought/Granted Permission sought to share information with : Case Manager, [place] granted to share information with : Yes, Verbal Permission Granted  Share Information with NAME: [name], wife.  Permission granted to share info w AGENCY: Hospice  Permission granted to share info w Relationship: Melissa O'Bryant     Emotional Assessment Appearance:: Appears stated age Attitude/Demeanor/Rapport: Lethargic Affect (typically observed): Other (comment) (Asleep) Orientation: : Oriented to Self Alcohol /  Substance Use: Not Applicable Psych Involvement: No (comment)  Admission diagnosis:  Tracheitis [J04.10] Patient Active Problem List   Diagnosis Date Noted   Tracheitis 08/19/2021   Dementia ([place]) 07/12/2021   Malignant neoplasm of supraglottis ([place]) 07/09/2021   Tracheostomy status ([place])     Protein-calorie malnutrition, severe 07/03/2021   Left renal mass 07/02/2021   Acute on chronic respiratory failure with hypoxia ([place]) 07/02/2021   Back pain 06/05/2020   Constipation 06/05/2020   Memory change 07/03/2019   BPH with urinary obstruction 11/10/2018   Bronchiectasis ([place]) 08/11/2018   Anemia 08/11/2018   Osteoarthritis of right hip 04/11/2018   Ascending aorta dilation ([place]) 03/09/2018   Pulmonary hypertension, unspecified ([place]) 03/09/2018   Chronic diastolic CHF (congestive heart failure) ([place]) 03/09/2018   Chronic cough 06/08/2016   Alcohol use 02/20/2016   Mixed hyperlipidemia 02/10/2016   SOB (shortness of breath) 08/26/2015   Bradycardia 08/26/2015   Acute pulmonary embolism ([place]) 11/19/2014   COPD with acute exacerbation ([place]) 12/27/2012   Biceps tendonitis 07/12/2012   Medicare annual wellness visit, subsequent 02/04/2012   Advance directive discussed with patient 02/04/2012   PVD (peripheral vascular disease) ([place]) 12/22/2011   Rhinitis 01/29/2011   CERVICAL STRAIN, ACUTE 04/08/2010   Hypothyroidism 01/17/2009   CARCINOMA, SKIN, SQUAMOUS CELL 01/08/2009   Weakness 12/27/2008   Depression 08/22/2008   Gout 12/12/2007   OBESITY 09/24/2007   Atherosclerosis of native coronary artery of native heart with stable angina pectoris ([place]) 06/16/2007   ERECTILE DYSFUNCTION 05/07/2007   OSA on CPAP 05/05/2007   Essential hypertension 05/05/2007   Coronary atherosclerosis 05/05/2007   SLEEP APNEA 05/05/2007   Hyperglycemia 04/05/2007   PCP:  Tonia Ghent, MD Pharmacy:   [facility], [place] [address] Phone: [phone] Fax: [phone]     Social Determinants of Health (SDOH) Interventions    Readmission Risk Interventions No flowsheet data found.

## 2021-08-21 NOTE — Progress Notes (Signed)
Initial Nutrition Assessment  DOCUMENTATION CODES:  Severe malnutrition in context of chronic illness  INTERVENTION:  Obtain new measured weight  Continue bolus TF via PEG: -1.5 cartons (344ml) Osmolite 1.5 cal QID, flush with 82ml free water before and after each bolus -173ml additional free water flush QID (per MD; pt previously discharged on 120ml QID)   Provides 2130 kcal, 89 grams of protein, and 1086 ml of H2O (1966 ml free water total with flushes) Meets 100% estimated calorie/protein needs  NUTRITION DIAGNOSIS:  Severe Malnutrition related to chronic illness (COPD, dementia, cancer) as evidenced by severe fat depletion, severe muscle depletion.  GOAL:  Patient will meet greater than or equal to 90% of their needs  MONITOR:  Labs, Weight trends, TF tolerance, I & O's, Skin  REASON FOR ASSESSMENT:  New TF    ASSESSMENT:  Pt admitted with acute hypoxic respiratory failure and recurrent aspiration/tracheitis.  PMH significant for COPD, type 2 DM, HTN, laryngeal Ca, dementia, and recent hospitalization from 07/01/21-08/12/21 for multifactorial acute respiratory failure and was treated for COPD exacerbation, aspiration PNA and underwent tracheostomy and PEG tube placement.  Discussed pt with RN in detail. Pt with significant secretions and having difficulty communicating at this time as a result.  Noted that this pt has been referred by his PCP for hospice services at home. TOC aware. At this time, pt is still receiving TF via PEG with no complications/issues with tolerance noted. Current TF regimen: Osmolite 1.5 367ml (1.5 cartons) QID (39ml free water flush before and after each bolus) and 145ml free water QID. Pt denies any feelings of hunger or abdominal pain. Recommend continue regimen at this time.   No weight taken for this admission. Please obtain new weight so weight history can be properly assessed.   Medications: SSI, protonix, senokot-s Labs: Na 132 (L) CBGs: 100-176 x  24 hours  UOP: 936ml x24 hours I/O: +2L since admit  NUTRITION - FOCUSED PHYSICAL EXAM: Flowsheet Row Most Recent Value  Orbital Region Severe depletion  Upper Arm Region Moderate depletion  Thoracic and Lumbar Region Severe depletion  Buccal Region Moderate depletion  Temple Region Severe depletion  Clavicle Bone Region Severe depletion  Clavicle and Acromion Bone Region Severe depletion  Scapular Bone Region Severe depletion  Dorsal Hand Severe depletion  Patellar Region Moderate depletion  Anterior Thigh Region Moderate depletion  Posterior Calf Region Moderate depletion  Edema (RD Assessment) None  Hair Reviewed  Eyes Reviewed  Mouth Reviewed  Skin Reviewed  Nails Reviewed      Diet Order:   Diet Order             Diet NPO time specified  Diet effective now                  EDUCATION NEEDS:  No education needs have been identified at this time  Skin:  Skin Assessment: Skin Integrity Issues: Skin Integrity Issues:: Incisions, Other (Comment) Incisions: closed incisions to neck and abdomen Other: skin tear to lower R arm, L knee, upper vertebral column, upper L arm, R/L pretibial; MASD R/L buttocks  Last BM:  PTA  Height:  Ht Readings from Last 1 Encounters:  08/15/21 5\' 6"  (1.676 m)   Weight:  Wt Readings from Last 10 Encounters:  08/15/21 72.6 kg  08/14/21 69 kg  08/10/21 69 kg  11/08/20 78.5 kg  10/08/20 78.5 kg  06/03/20 81 kg  04/11/20 81.4 kg  04/09/20 81.6 kg  04/01/20 81.7 kg  03/02/20 82.1  kg   BMI:  There is no height or weight on file to calculate BMI.  Estimated Nutritional Needs:  Kcal:  2000-2200 kcal Protein:  85-105 grams Fluid:  >/= 2 L/day    Larkin Ina, MS, RD, LDN (she/her/hers) RD pager number and weekend/on-call pager number located in [place].

## 2021-08-21 NOTE — Plan of Care (Signed)

## 2021-08-22 LAB — MAGNESIUM: Magnesium: 2.4 mg/dL (ref 1.7–2.4)

## 2021-08-22 LAB — BASIC METABOLIC PANEL
Anion gap: 7 (ref 5–15)
BUN: 20 mg/dL (ref 8–23)
CO2: 30 mmol/L (ref 22–32)
Calcium: 8.5 mg/dL — ABNORMAL LOW (ref 8.9–10.3)
Chloride: 95 mmol/L — ABNORMAL LOW (ref 98–111)
Creatinine, Ser: 0.66 mg/dL (ref 0.61–1.24)
GFR, Estimated: >60 mL/min (ref >60)
Glucose, Bld: 132 mg/dL — ABNORMAL HIGH (ref 70–99)
Potassium: 4.5 mmol/L (ref 3.5–5.1)
Sodium: 132 mmol/L — ABNORMAL LOW (ref 135–145)

## 2021-08-22 LAB — GLUCOSE, CAPILLARY
Glucose-Capillary: 120 mg/dL — ABNORMAL HIGH (ref 70–99)
Glucose-Capillary: 101 mg/dL — ABNORMAL HIGH (ref 70–99)
Glucose-Capillary: 137 mg/dL — ABNORMAL HIGH (ref 70–99)
Glucose-Capillary: 162 mg/dL — ABNORMAL HIGH (ref 70–99)

## 2021-08-22 LAB — CBC
HCT: 28.4 % — ABNORMAL LOW (ref 39.0–52.0)
Hemoglobin: 9.1 g/dL — ABNORMAL LOW (ref 13.0–17.0)
MCH: 30.3 pg (ref 26.0–34.0)
MCHC: 32 g/dL (ref 30.0–36.0)
MCV: 94.7 fL (ref 80.0–100.0)
Platelets: 177 10*3/uL (ref 150–400)
RBC: 3 MIL/uL — ABNORMAL LOW (ref 4.22–5.81)
RDW: 13.9 % (ref 11.5–15.5)
WBC: 11.9 10*3/uL — ABNORMAL HIGH (ref 4.0–10.5)
nRBC: 0 % (ref 0.0–0.2)

## 2021-08-22 MED ORDER — IPRATROPIUM-ALBUTEROL 0.5-2.5 (3) MG/3ML IN SOLN
3.0000 mL | Freq: Three times a day (TID) | RESPIRATORY_TRACT | Status: DC
  Administered 2021-08-22: 3 mL via RESPIRATORY_TRACT
  Filled 2021-08-22: qty 3

## 2021-08-22 MED ORDER — IPRATROPIUM-ALBUTEROL 0.5-2.5 (3) MG/3ML IN SOLN
3.0000 mL | Freq: Two times a day (BID) | RESPIRATORY_TRACT | Status: DC
  Administered 2021-08-22 – 2021-08-24 (×4): 3 mL via RESPIRATORY_TRACT
  Filled 2021-08-22 (×4): qty 3

## 2021-08-22 NOTE — TOC Progression Note (Signed)
Transition of Care [facility]) - Progression Note    Patient Details  Name: Christopher Santiago MRN: [id number] Date of Birth: Jan 22, 1931  Transition [facility]) CM/SW Contact  Sharlet Salina Mila Homer, LCSW Phone Number: 08/22/2021, 12:28 PM  Clinical Narrative:   Received secure chat from MD regarding discharging patient home today and to check with wife to determine if equipment has been delivered. Contacted Jennifer with [employer] and she reported that they had been unable to reach wife by phone. She would check to see if equipment had been delivered. Call made to Mrs Mcadam [phone]) regarding her husband's discharge and asked if equipment had been delivered. Mrs. Gangemi reported that they equipment has not been delivered and she really wants her husband to discharge tomorrow as she has no one to help her out today and she has her own health issues.    Contacted Anderson Malta with [employer] and update provided regarding equipment and secure chat sent to MD regarding wife requesting for her husband to discharge tomorrow.  MD responded in agreement.      Barriers to Discharge: Continued Medical Work up  Expected Discharge Plan and Services     Discharge Planning Services: CM Consult Post Acute Care Choice: Hospice Living arrangements for the past 2 months: Single Family Home                   DME Agency: [employer] Date DME Agency Contacted: 08/21/21 Time DME Agency Contacted: 55 Representative spoke with at DME Agency: [place]: RN, PT, OT, Nurse's Aide, Respirator Therapy, Speech Therapy HH Agency: [place] Date [place]: 08/21/21 Time [place]: 39 Representative spoke with at [place]: [place] ([place]) Interventions  Hospice is providing DME for patient  Readmission Risk Interventions No flowsheet data found.

## 2021-08-22 NOTE — Plan of Care (Signed)
  Problem: Education: Goal: Knowledge of General Education information will improve Description Including pain rating scale, medication(s)/side effects and non-pharmacologic comfort measures Outcome: Progressing   

## 2021-08-22 NOTE — Care Management Important Message (Signed)
Important Message  Patient Details  Name: Christopher Santiago MRN: [id number] Date of Birth: Jan 02, 1931   Medicare Important Message Given:  Yes     Orbie Pyo 08/22/2021, 3:36 PM

## 2021-08-22 NOTE — Progress Notes (Signed)
Obese PROGRESS NOTE    Christopher Santiago  HER:[id number] DOB: 1931/08/04 DOA: 08/19/2021 PCP: Tonia Ghent, MD   Brief Narrative:  87 y.o. male with history h/o COPD, type 2 diabetes mellitus, essential hypertension, laryngeal cancer ->recently had prolonged hospitalization 07/01/21-08/12/21 for multifactorial acute respiratory failure, treated for COPD exacerbation, aspiration pneumonia and also underwent tracheostomy by ENT for recurrent squamous cell laryngeal cancer-> was discharged home with instructions to use scheduled Brovana/budesonide/Yupleri nebs as well as as needed albuterol neb, trach care and oral suction frequently. Patient returned to the ED on 9/22 for dislodged tracheostomy tube, replaced and discharged home with antibiotics (Keflex) for UTI. patient with significant respiratory distress, wife states she tried to suction his trach but he had very thick secretions.  She states she did the suction tube and water and tried to suction again through the trach to make the secretions softer.  Ultimately patient was brought to the ER. He was seen by pulmonary team and swtiched to PO Abx.    Assessment & Plan:   Principal Problem:   Acute on chronic respiratory failure with hypoxia (HCC) Active Problems:   Gout   Essential hypertension   Coronary atherosclerosis   PVD (peripheral vascular disease) (HCC)   Mixed hyperlipidemia   Ascending aorta dilation (HCC)   Pulmonary hypertension, unspecified (HCC)   BPH with urinary obstruction   Tracheostomy status ([place])   Malignant neoplasm of supraglottis (HCC)   Dementia (HCC)   Tracheitis  Acute hypoxic respiratory failure, recurrent aspiration/tracheitis:  Tracheostomy replaced by pulmonary team.  IV antibiotics has been switched to oral cefdinir, will complete 7-day course.  Still having significant amount of secretions.  Scopolamine patch discontinued.  Robinul started, discussed with pulmonary.  DuoNebs scheduled and as needed.    COPD, obstructive sleep apnea, pulmonary hypertension:  -Continue as needed bronchodilators.   Acute metabolic encephalopathy, delirium with underlying dementia:  - Doing much better.  On melatonin at bedtime Seroquel for agitation  Falls: Seen by wound care team   CAD, PVD, HTN: Currently patient is chest pain-free, continue aspirin and Norvasc.   LVH, stage I diastolic dysfunction:  -No signs of hemodynamic instability.  Continue to monitor at this time.   Hyponatremia, adrenal adenoma:  - Improved with fluids.   Diabetes mellitus type 2:  -Not on any medications and hemoglobin A1c 5.8 while hospitalized recently.  Monitor blood glucose levels while on tube feeds.  Sliding scale insulin    History of DVT/PE:  -Not on any anticoagulation.  Noted history of duodenal ulcer with hemorrhage in 2021 and s/p IVC filter   Laryngeal mass, RUL pulmonary nodule:  -Follows ENT.  Now s/p trach.  Outpatient follow-up with oncology set up in last hospitalization.     Gout: - Resume allopurinol   BPH: - Resume home medications.   Duodenal ulcers : - Resume PPI.  No signs of GI bleed currently.   Hospice team is following and working with family to make arrangements at home.    DVT prophylaxis: SCDs Code Status: DNR Family Communication:   Spoke with his wife today  Status is: Inpatient  Remains inpatient appropriate because:Inpatient level of care appropriate due to severity of illness.  Hospice arrangements being made at home. Dispo: The patient is from: Home              Anticipated d/c is to: Home              Patient currently is medically stable but  ongoing hospice arrangements at home.  There may be delay in making arrangements due to poor weather conditions.  We will discharge him when it safe   Difficult to place patient No       Subjective: Patient is awake alert having thin secretions but is able to suction it on his own. Review of Systems Otherwise negative  except as per HPI, including: General: Denies fever, chills, night sweats or unintended weight loss. Resp: Denies cough, wheezing, shortness of breath. Cardiac: Denies chest pain, palpitations, orthopnea, paroxysmal nocturnal dyspnea. GI: Denies abdominal pain, nausea, vomiting, diarrhea or constipation GU: Denies dysuria, frequency, hesitancy or incontinence MS: Denies muscle aches, joint pain or swelling Neuro: Denies headache, neurologic deficits (focal weakness, numbness, tingling), abnormal gait Psych: Denies anxiety, depression, SI/HI/AVH Skin: Denies new rashes or lesions ID: Denies sick contacts, exotic exposures, travel  Examination:  Constitutional: Not in acute distress, appears chronically ill. Respiratory: Minimal bilateral rhonchi Cardiovascular: Normal sinus rhythm, no rubs Abdomen: Nontender nondistended good bowel sounds Musculoskeletal: No edema noted Skin: No rashes seen Neurologic: CN 2-12 grossly intact.  And nonfocal Psychiatric: Normal judgment and insight. Alert and oriented x 3. Normal mood.  Tracheostomy & Peg in place  Objective: Vitals:   08/22/21 0420 08/22/21 0556 08/22/21 0755 08/22/21 1012  BP:  (!) 144/60  134/66  Pulse: 84 70 63 71  Resp: 18 18 20 18   Temp:  98.5 F (36.9 C)  98.6 F (37 C)  TempSrc:  Oral  Oral  SpO2: 96% 96% 96% 99%    Intake/Output Summary (Last 24 hours) at 08/22/2021 1157 Last data filed at 08/22/2021 0839 Gross per 24 hour  Intake 1176.3 ml  Output 1600 ml  Net -423.7 ml   There were no vitals filed for this visit.   Data Reviewed:   CBC: Recent Labs  Lab 08/19/21 0856 08/20/21 0140 08/21/21 0614 08/22/21 0206  WBC 17.2* 13.7* 12.7* 11.9*  NEUTROABS 14.9*  --   --   --   HGB 10.8* 8.2* 9.7* 9.1*  HCT 34.0* 26.6* 31.4* 28.4*  MCV 94.4 95.7 95.4 94.7  PLT 193 155 184 283   Basic Metabolic Panel: Recent Labs  Lab 08/19/21 0856 08/20/21 0140 08/21/21 0614 08/22/21 0206  NA 130* 128* 132* 132*  K  4.8 4.6 4.7 4.5  CL 91* 94* 94* 95*  CO2 31 28 28 30   GLUCOSE 156* 194* 101* 132*  BUN 27* 31* 21 20  CREATININE 0.92 0.78 0.75 0.66  CALCIUM 9.0 8.3* 8.6* 8.5*  MG  --   --  2.1 2.4   GFR: Estimated Creatinine Clearance: 57.6 mL/min (by C-G formula based on SCr of 0.66 mg/dL). Liver Function Tests: No results for input(s): AST, ALT, ALKPHOS, BILITOT, PROT, ALBUMIN in the last 168 hours. No results for input(s): LIPASE, AMYLASE in the last 168 hours. No results for input(s): AMMONIA in the last 168 hours. Coagulation Profile: No results for input(s): INR, PROTIME in the last 168 hours. Cardiac Enzymes: No results for input(s): CKTOTAL, CKMB, CKMBINDEX, TROPONINI in the last 168 hours. BNP (last 3 results) Recent Labs    11/08/20 1106  PROBNP 482.0*   HbA1C: No results for input(s): HGBA1C in the last 72 hours. CBG: Recent Labs  Lab 08/21/21 1132 08/21/21 1644 08/21/21 2217 08/22/21 0612 08/22/21 1057  GLUCAP 200* 112* 174* 120* 101*   Lipid Profile: No results for input(s): CHOL, HDL, LDLCALC, TRIG, CHOLHDL, LDLDIRECT in the last 72 hours. Thyroid Function Tests: No results  for input(s): TSH, T4TOTAL, FREET4, T3FREE, THYROIDAB in the last 72 hours. Anemia Panel: No results for input(s): VITAMINB12, FOLATE, FERRITIN, TIBC, IRON, RETICCTPCT in the last 72 hours. Sepsis Labs: Recent Labs  Lab 08/19/21 1930 08/20/21 0135 08/20/21 0140  PROCALCITON  --   --  <0.10  LATICACIDVEN 3.9* 1.0  --     Recent Results (from the past 240 hour(s))  Urine Culture     Status: Abnormal   Collection Time: 08/14/21  2:52 PM   Specimen: Urine, Clean Catch  Result Value Ref Range Status   Specimen Description URINE, CLEAN CATCH  Final   Special Requests   Final    NONE Performed at [facility], [place] [address] [place], [place] [address]    Culture >=100,000 COLONIES/mL PROTEUS MIRABILIS (A)  Final   Report Status 08/16/2021 FINAL  Final   Organism ID, Bacteria PROTEUS  MIRABILIS (A)  Final      Susceptibility   Proteus mirabilis - MIC*    AMPICILLIN <=2 SENSITIVE Sensitive     CEFAZOLIN 8 SENSITIVE Sensitive     CEFEPIME <=0.12 SENSITIVE Sensitive     CEFTRIAXONE <=0.25 SENSITIVE Sensitive     CIPROFLOXACIN <=0.25 SENSITIVE Sensitive     GENTAMICIN <=1 SENSITIVE Sensitive     IMIPENEM 4 SENSITIVE Sensitive     NITROFURANTOIN 128 RESISTANT Resistant     TRIMETH/SULFA <=20 SENSITIVE Sensitive     AMPICILLIN/SULBACTAM <=2 SENSITIVE Sensitive     PIP/TAZO <=4 SENSITIVE Sensitive     * >=100,000 COLONIES/mL PROTEUS MIRABILIS  Culture, blood (routine x 2)     Status: None (Preliminary result)   Collection Time: 08/19/21 12:30 PM   Specimen: BLOOD  Result Value Ref Range Status   Specimen Description BLOOD LEFT ANTECUBITAL  Final   Special Requests   Final    BOTTLES DRAWN AEROBIC AND ANAEROBIC Blood Culture adequate volume   Culture   Final    NO GROWTH 3 DAYS Performed at [facility], [address], [place], [place] [address]    Report Status PENDING  Incomplete  Culture, blood (routine x 2)     Status: None (Preliminary result)   Collection Time: 08/19/21 12:41 PM   Specimen: BLOOD  Result Value Ref Range Status   Specimen Description BLOOD RIGHT ANTECUBITAL  Final   Special Requests   Final    BOTTLES DRAWN AEROBIC AND ANAEROBIC Blood Culture results may not be optimal due to an excessive volume of blood received in culture bottles   Culture   Final    NO GROWTH 3 DAYS Performed at [facility], [place] [address] [place],  [address]    Report Status PENDING  Incomplete  Resp Panel by RT-PCR (Flu A&B, Covid) Nasopharyngeal Swab     Status: None   Collection Time: 08/19/21  3:04 PM   Specimen: Nasopharyngeal Swab; Nasopharyngeal(NP) swabs in vial transport medium  Result Value Ref Range Status   SARS Coronavirus 2 by RT PCR NEGATIVE NEGATIVE Final    Comment: (NOTE) SARS-CoV-2 target nucleic acids are NOT DETECTED.  The  SARS-CoV-2 RNA is generally detectable in upper respiratory specimens during the acute phase of infection. The lowest concentration of SARS-CoV-2 viral copies this assay can detect is 138 copies/mL. A negative result does not preclude SARS-Cov-2 infection and should not be used as the sole basis for treatment or other patient management decisions. A negative result may occur with  improper specimen collection/handling, submission of specimen  other than nasopharyngeal swab, presence of viral mutation(s) within the areas targeted by this assay, and inadequate number of viral copies(<138 copies/mL). A negative result must be combined with clinical observations, patient history, and epidemiological information. The expected result is Negative.  Fact Sheet for Patients:  EntrepreneurPulse.com.au  Fact Sheet for Healthcare Providers:  IncredibleEmployment.be  This test is no t yet approved or cleared by the [place] FDA and  has been authorized for detection and/or diagnosis of SARS-CoV-2 by FDA under an Emergency Use Authorization (EUA). This EUA will remain  in effect (meaning this test can be used) for the duration of the COVID-19 declaration under Section 564(b)(1) of the Act, 21 U.S.C.section 360bbb-3(b)(1), unless the authorization is terminated  or revoked sooner.       Influenza A by PCR NEGATIVE NEGATIVE Final   Influenza B by PCR NEGATIVE NEGATIVE Final    Comment: (NOTE) The Xpert Xpress SARS-CoV-2/FLU/RSV plus assay is intended as an aid in the diagnosis of influenza from Nasopharyngeal swab specimens and should not be used as a sole basis for treatment. Nasal washings and aspirates are unacceptable for Xpert Xpress SARS-CoV-2/FLU/RSV testing.  Fact Sheet for Patients: EntrepreneurPulse.com.au  Fact Sheet for Healthcare Providers: IncredibleEmployment.be  This test is not yet approved or  cleared by the [place] FDA and has been authorized for detection and/or diagnosis of SARS-CoV-2 by FDA under an Emergency Use Authorization (EUA). This EUA will remain in effect (meaning this test can be used) for the duration of the COVID-19 declaration under Section 564(b)(1) of the Act, 21 U.S.C. section 360bbb-3(b)(1), unless the authorization is terminated or revoked.  Performed at [facility], [place] [address], [place], [place] [address]   Culture, Respiratory w Gram Stain     Status: None (Preliminary result)   Collection Time: 08/20/21 10:44 AM   Specimen: Tracheal Aspirate; Respiratory  Result Value Ref Range Status   Specimen Description TRACHEAL ASPIRATE  Final   Special Requests NONE  Final   Gram Stain   Final    MODERATE SQUAMOUS EPITHELIAL CELLS PRESENT ABUNDANT WBC PRESENT, PREDOMINANTLY PMN MODERATE GRAM POSITIVE COCCI MODERATE GRAM NEGATIVE RODS RARE GRAM POSITIVE RODS    Culture   Final    CULTURE REINCUBATED FOR BETTER GROWTH Performed at [facility], [place] [address] [place], [place] [address]    Report Status PENDING  Incomplete         Radiology Studies: DG Shoulder Right Port  Result Date: 08/21/2021 CLINICAL DATA:  Pain in right shoulder. EXAM: PORTABLE RIGHT SHOULDER COMPARISON:  None. FINDINGS: There is no sign of acute fracture or dislocation. Bones appear diffusely osteopenic. Mild AC joint degenerative change. IMPRESSION: 1. No acute findings. 2. Mild AC joint osteoarthritis. Electronically Signed   By: Kerby Moors M.D.   On: 08/21/2021 19:00     Scheduled Meds:  allopurinol  100 mg Per Tube Daily   amLODipine  5 mg Per Tube Daily   aspirin  81 mg Per Tube Daily   budesonide  0.25 mg Nebulization BID   cefdinir  300 mg Oral Q12H   chlorhexidine  15 mL Mouth Rinse BID   citalopram  60 mg Per Tube Daily   feeding supplement (OSMOLITE 1.5 CAL)  355 mL Per Tube TID WC & HS   free water  100 mL Per Tube QID    glycopyrrolate  1 mg Per Tube TID   guaiFENesin  10 mL Per Tube Q8H   insulin aspart  0-9 Units Subcutaneous TID WC   ipratropium-albuterol  3 mL Nebulization BID   mouth rinse  15 mL Mouth Rinse q12n4p   melatonin  5 mg Oral QHS   pantoprazole sodium  40 mg Per Tube BID   senna-docusate  1 tablet Per Tube BID   Continuous Infusions:     LOS: 3 days   Time spent= 35 mins    Nicolet Griffy Arsenio Loader, MD [facility]  If 7PM-7AM, please contact night-coverage  08/22/2021, 11:57 AM

## 2021-08-22 NOTE — Plan of Care (Signed)

## 2021-08-22 NOTE — Progress Notes (Signed)
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   All should be in place for discharge Saturday.  Please send completed DNR and arrange for any comfort meds/anxiolytics that the pt may need prior to hospice services starting on Sunday.  Thank you, Venia Carbon RN, BSN, [facility] Liaison

## 2021-08-23 ENCOUNTER — Inpatient Hospital Stay (HOSPITAL_COMMUNITY): Payer: Medicare Other

## 2021-08-23 DIAGNOSIS — Z515 Encounter for palliative care: Secondary | ICD-10-CM

## 2021-08-23 HISTORY — PX: IR REPLC GASTRO/COLONIC TUBE PERCUT W/FLUORO: IMG2333

## 2021-08-23 LAB — CBC
HCT: 26.5 % — ABNORMAL LOW (ref 39.0–52.0)
Hemoglobin: 8.4 g/dL — ABNORMAL LOW (ref 13.0–17.0)
MCH: 29.7 pg (ref 26.0–34.0)
MCHC: 31.7 g/dL (ref 30.0–36.0)
MCV: 93.6 fL (ref 80.0–100.0)
Platelets: 201 10*3/uL (ref 150–400)
RBC: 2.83 MIL/uL — ABNORMAL LOW (ref 4.22–5.81)
RDW: 13.8 % (ref 11.5–15.5)
WBC: 10.1 10*3/uL (ref 4.0–10.5)
nRBC: 0 % (ref 0.0–0.2)

## 2021-08-23 LAB — BASIC METABOLIC PANEL
Anion gap: 5 (ref 5–15)
BUN: 20 mg/dL (ref 8–23)
CO2: 32 mmol/L (ref 22–32)
Calcium: 8.4 mg/dL — ABNORMAL LOW (ref 8.9–10.3)
Chloride: 95 mmol/L — ABNORMAL LOW (ref 98–111)
Creatinine, Ser: 0.69 mg/dL (ref 0.61–1.24)
GFR, Estimated: >60 mL/min (ref >60)
Glucose, Bld: 126 mg/dL — ABNORMAL HIGH (ref 70–99)
Potassium: 4.3 mmol/L (ref 3.5–5.1)
Sodium: 132 mmol/L — ABNORMAL LOW (ref 135–145)

## 2021-08-23 LAB — MAGNESIUM: Magnesium: 2.1 mg/dL (ref 1.7–2.4)

## 2021-08-23 LAB — GLUCOSE, CAPILLARY
Glucose-Capillary: 102 mg/dL — ABNORMAL HIGH (ref 70–99)
Glucose-Capillary: 144 mg/dL — ABNORMAL HIGH (ref 70–99)
Glucose-Capillary: 214 mg/dL — ABNORMAL HIGH (ref 70–99)

## 2021-08-23 MED ORDER — OXYCODONE HCL 5 MG PO CAPS: 5.0000 mg | ORAL_CAPSULE | ORAL | 0 refills | Status: DC | PRN

## 2021-08-23 MED ORDER — MELATONIN 5 MG PO TABS: 5.0000 mg | ORAL_TABLET | Freq: Every evening | ORAL | 0 refills | Status: AC | PRN

## 2021-08-23 MED ORDER — LIDOCAINE VISCOUS HCL 2 % MT SOLN
OROMUCOSAL | Status: AC
  Filled 2021-08-23: qty 15

## 2021-08-23 MED ORDER — STERILE WATER FOR INJECTION IJ SOLN
INTRAMUSCULAR | Status: AC
  Filled 2021-08-23: qty 10

## 2021-08-23 MED ORDER — CEFDINIR 300 MG PO CAPS: 300.0000 mg | ORAL_CAPSULE | Freq: Two times a day (BID) | ORAL | 0 refills | Status: AC

## 2021-08-23 MED ORDER — IPRATROPIUM-ALBUTEROL 0.5-2.5 (3) MG/3ML IN SOLN: 3.0000 mL | Freq: Two times a day (BID) | RESPIRATORY_TRACT | 0 refills | Status: AC

## 2021-08-23 MED ORDER — IPRATROPIUM-ALBUTEROL 0.5-2.5 (3) MG/3ML IN SOLN: 3.0000 mL | RESPIRATORY_TRACT | 0 refills | Status: AC | PRN

## 2021-08-23 MED ORDER — IOHEXOL 240 MG/ML SOLN
50.0000 mL | Freq: Once | INTRAMUSCULAR | Status: AC | PRN
  Administered 2021-08-23: 50 mL via INTRAVENOUS

## 2021-08-23 MED ORDER — SCOPOLAMINE 1 MG/3DAYS TD PT72
1.0000 | MEDICATED_PATCH | TRANSDERMAL | Status: DC
  Administered 2021-08-23: 1.5 mg via TRANSDERMAL
  Filled 2021-08-23: qty 1

## 2021-08-23 MED ORDER — LORAZEPAM 0.5 MG PO TABS: 0.5000 mg | ORAL_TABLET | Freq: Three times a day (TID) | ORAL | 0 refills | Status: AC | PRN

## 2021-08-23 MED ORDER — QUETIAPINE FUMARATE 25 MG PO TABS: 25.0000 mg | ORAL_TABLET | Freq: Two times a day (BID) | ORAL | 0 refills | Status: AC | PRN

## 2021-08-23 NOTE — Progress Notes (Signed)
Patient pulled out PEG tube, MD notified.  IR aware and scheduling replacement of tube.  Will continue to monitor.  Aurther Loft

## 2021-08-23 NOTE — Procedures (Signed)
Interventional Radiology Procedure Note  Date of Procedure: 08/23/2021  Procedure: G tube replacement   Findings:  1. Successful replacement of G tube with new 20 Fr balloon gastrostomy tube.    Complications: No immediate complications noted.   Estimated Blood Loss: minimal  Follow-up and Recommendations: 1. Ready for immediate use  2. Consider use of abdominal binder to prevent accidental dislodgment    Albin Felling, MD  Vascular & Interventional Radiology  08/23/2021 4:00 PM

## 2021-08-23 NOTE — Progress Notes (Signed)
While performing rounds, pt. was noted to have pulled out his tracheostomy tube. Pt sats remained above 90. Respiratory therapist notified.

## 2021-08-23 NOTE — Plan of Care (Signed)

## 2021-08-23 NOTE — Progress Notes (Signed)
Obese Christopher NOTE    MORSE BRUEGGEMANN  ZHY:[id number] DOB: 1931/07/01 DOA: 08/19/2021 PCP: Tonia Ghent, Christopher   Brief Narrative:  87 y.o. male with history h/o COPD, type 2 diabetes mellitus, essential hypertension, laryngeal cancer ->recently had prolonged hospitalization 07/01/21-08/12/21 for multifactorial acute respiratory failure, treated for COPD exacerbation, aspiration pneumonia and also underwent tracheostomy by ENT for recurrent squamous cell laryngeal cancer-> was discharged home with instructions to use scheduled Brovana/budesonide/Yupleri nebs as well as as needed albuterol neb, trach care and oral suction frequently. Patient returned to the ED on 9/22 for dislodged tracheostomy tube, replaced and discharged home with antibiotics (Keflex) for UTI. patient with significant respiratory distress, wife states she tried to suction his trach but he had very thick secretions.  She states she did the suction tube and water and tried to suction again through the trach to make the secretions softer.  Ultimately patient was brought to the ER. He was seen by pulmonary team and swtiched to PO Abx.    Assessment & Plan:   Principal Problem:   Acute on chronic respiratory failure with hypoxia (HCC) Active Problems:   Gout   Essential hypertension   Coronary atherosclerosis   PVD (peripheral vascular disease) (HCC)   Mixed hyperlipidemia   Ascending aorta dilation (HCC)   Pulmonary hypertension, unspecified ([place])   BPH with urinary obstruction   Tracheostomy status ([place])   Malignant neoplasm of supraglottis ([place])   Dementia ([place])   Tracheitis   Hospice care patient  Acute hypoxic respiratory failure, recurrent aspiration/tracheitis:  Tracheostomy replaced by pulmonary team.  IV antibiotics has been switched to oral cefdinir, will complete 7-day course.  Still having significant amount of secretions.  Discussed with pulmonary, initially scopolamine was stopped and slowly Robinul was started  to help with secretions.  Secretions continue to increase requiring very frequent suctioning.  Scopolamine patch have been restarted.  This can be adjusted as needed to manage his secretions.  Discontinue it if his secretions get too thick.   COPD, obstructive sleep apnea, pulmonary hypertension:  -Continue as needed bronchodilators.  Will discontinue lip Lary which was expensive for the family therefore we will place patient on duo nebs twice daily and as needed.  DME nebulizer machine has been ordered.   Acute metabolic encephalopathy, delirium with underlying dementia:  - Doing much better.  On melatonin at bedtime Seroquel for agitation  Falls: Seen by wound care team   CAD, PVD, HTN: Currently patient is chest pain-free, continue aspirin and Norvasc.   LVH, stage I diastolic dysfunction:  -No signs of hemodynamic instability.  Continue to monitor at this time.   Hyponatremia, adrenal adenoma:  - Improved with fluids.   Diabetes mellitus type 2:  -Not on any medications and hemoglobin A1c 5.8 while hospitalized recently.  Monitor blood glucose levels while on tube feeds.  Sliding scale insulin    History of DVT/PE:  -Not on any anticoagulation.  Noted history of duodenal ulcer with hemorrhage in 2021 and s/p IVC filter   Laryngeal mass, RUL pulmonary nodule:  -Follows ENT.  Now s/p trach.  Outpatient follow-up with oncology set up in last hospitalization.     Gout: - Resume allopurinol   BPH: - Resume home medications.   Duodenal ulcers : - Resume PPI.  No signs of GI bleed currently.   Hospice team working on making arrangements at home.  Most of things have been arranged besides hospital bed, nebulizer machine and nebulizer machine training.  DVT prophylaxis: SCDs Code Status: DNR Family Communication: Spoke with wife and daughter today  Status is: Inpatient  Remains inpatient appropriate because:Inpatient level of care appropriate due to severity of illness.   Hospice arrangements being made at home. Dispo: The patient is from: Home              Anticipated d/c is to: Home              Patient currently is medically stable but ongoing arrangements at home by hospice team and awaiting equipment to be delivered and provide family with appropriate training.   Difficult to place patient No       Subjective: Having quite a bit of secretions requiring frequent suctioning.  Apparently he pulled out his trach overnight and try to get out of bed but now it appears to be in good position    Examination:  Constitutional: Appears chronically ill.  Trach in place with secretions Respiratory: Mild bilateral diffuse rhonchi Cardiovascular: Normal sinus rhythm, no rubs Abdomen: Nontender nondistended good bowel sounds Musculoskeletal: No edema noted Skin: No rashes seen Neurologic: CN 2-12 grossly intact.  Grossly moving all the extremities Psychiatric: Normal judgment and insight. Alert and oriented x 3. Normal mood. Tracheostomy & Peg in place  Objective: Vitals:   08/23/21 0144 08/23/21 0354 08/23/21 0522 08/23/21 0732  BP:   133/66   Pulse:   63 64  Resp:   16 20  Temp:   97.7 F (36.5 C)   TempSrc:   Oral   SpO2: 94% 94% 100% 94%    Intake/Output Summary (Last 24 hours) at 08/23/2021 1034 Last data filed at 08/22/2021 1455 Gross per 24 hour  Intake 200 ml  Output 350 ml  Net -150 ml   There were no vitals filed for this visit.   Data Reviewed:   CBC: Recent Labs  Lab 08/19/21 0856 08/20/21 0140 08/21/21 0614 08/22/21 0206 08/23/21 0356  WBC 17.2* 13.7* 12.7* 11.9* 10.1  NEUTROABS 14.9*  --   --   --   --   HGB 10.8* 8.2* 9.7* 9.1* 8.4*  HCT 34.0* 26.6* 31.4* 28.4* 26.5*  MCV 94.4 95.7 95.4 94.7 93.6  PLT 193 155 184 177 638   Basic Metabolic Panel: Recent Labs  Lab 08/19/21 0856 08/20/21 0140 08/21/21 0614 08/22/21 0206 08/23/21 0356  NA 130* 128* 132* 132* 132*  K 4.8 4.6 4.7 4.5 4.3  CL 91* 94* 94* 95* 95*   CO2 31 28 28 30  32  GLUCOSE 156* 194* 101* 132* 126*  BUN 27* 31* 21 20 20   CREATININE 0.92 0.78 0.75 0.66 0.69  CALCIUM 9.0 8.3* 8.6* 8.5* 8.4*  MG  --   --  2.1 2.4 2.1   GFR: Estimated Creatinine Clearance: 57.6 mL/min (by C-G formula based on SCr of 0.69 mg/dL). Liver Function Tests: No results for input(s): AST, ALT, ALKPHOS, BILITOT, PROT, ALBUMIN in the last 168 hours. No results for input(s): LIPASE, AMYLASE in the last 168 hours. No results for input(s): AMMONIA in the last 168 hours. Coagulation Profile: No results for input(s): INR, PROTIME in the last 168 hours. Cardiac Enzymes: No results for input(s): CKTOTAL, CKMB, CKMBINDEX, TROPONINI in the last 168 hours. BNP (last 3 results) Recent Labs    11/08/20 1106  PROBNP 482.0*   HbA1C: No results for input(s): HGBA1C in the last 72 hours. CBG: Recent Labs  Lab 08/22/21 0612 08/22/21 1057 08/22/21 1647 08/22/21 2138 08/23/21 0610  GLUCAP 120* 101*  137* 162* 102*   Lipid Profile: No results for input(s): CHOL, HDL, LDLCALC, TRIG, CHOLHDL, LDLDIRECT in the last 72 hours. Thyroid Function Tests: No results for input(s): TSH, T4TOTAL, FREET4, T3FREE, THYROIDAB in the last 72 hours. Anemia Panel: No results for input(s): VITAMINB12, FOLATE, FERRITIN, TIBC, IRON, RETICCTPCT in the last 72 hours. Sepsis Labs: Recent Labs  Lab 08/19/21 1930 08/20/21 0135 08/20/21 0140  PROCALCITON  --   --  <0.10  LATICACIDVEN 3.9* 1.0  --     Recent Results (from the past 240 hour(s))  Urine Culture     Status: Abnormal   Collection Time: 08/14/21  2:52 PM   Specimen: Urine, Clean Catch  Result Value Ref Range Status   Specimen Description URINE, CLEAN CATCH  Final   Special Requests   Final    Santiago Performed at [facility], [place] [address] [place], [place] [address]    Culture >=100,000 COLONIES/mL PROTEUS MIRABILIS (A)  Final   Report Status 08/16/2021 FINAL  Final   Organism ID, Bacteria PROTEUS MIRABILIS  (A)  Final      Susceptibility   Proteus mirabilis - MIC*    AMPICILLIN <=2 SENSITIVE Sensitive     CEFAZOLIN 8 SENSITIVE Sensitive     CEFEPIME <=0.12 SENSITIVE Sensitive     CEFTRIAXONE <=0.25 SENSITIVE Sensitive     CIPROFLOXACIN <=0.25 SENSITIVE Sensitive     GENTAMICIN <=1 SENSITIVE Sensitive     IMIPENEM 4 SENSITIVE Sensitive     NITROFURANTOIN 128 RESISTANT Resistant     TRIMETH/SULFA <=20 SENSITIVE Sensitive     AMPICILLIN/SULBACTAM <=2 SENSITIVE Sensitive     PIP/TAZO <=4 SENSITIVE Sensitive     * >=100,000 COLONIES/mL PROTEUS MIRABILIS  Culture, blood (routine x 2)     Status: Santiago (Preliminary result)   Collection Time: 08/19/21 12:30 PM   Specimen: BLOOD  Result Value Ref Range Status   Specimen Description BLOOD LEFT ANTECUBITAL  Final   Special Requests   Final    BOTTLES DRAWN AEROBIC AND ANAEROBIC Blood Culture adequate volume   Culture   Final    NO GROWTH 4 DAYS Performed at [facility], [address], [place], [place] [address]    Report Status PENDING  Incomplete  Culture, blood (routine x 2)     Status: Santiago (Preliminary result)   Collection Time: 08/19/21 12:41 PM   Specimen: BLOOD  Result Value Ref Range Status   Specimen Description BLOOD RIGHT ANTECUBITAL  Final   Special Requests   Final    BOTTLES DRAWN AEROBIC AND ANAEROBIC Blood Culture results may not be optimal due to an excessive volume of blood received in culture bottles   Culture   Final    NO GROWTH 4 DAYS Performed at [facility], [place] [address] [place], [place] [address]    Report Status PENDING  Incomplete  Resp Panel by RT-PCR (Flu A&B, Covid) Nasopharyngeal Swab     Status: Santiago   Collection Time: 08/19/21  3:04 PM   Specimen: Nasopharyngeal Swab; Nasopharyngeal(NP) swabs in vial transport medium  Result Value Ref Range Status   SARS Coronavirus 2 by RT PCR NEGATIVE NEGATIVE Final    Comment: (NOTE) SARS-CoV-2 target nucleic acids are NOT DETECTED.  The SARS-CoV-2  RNA is generally detectable in upper respiratory specimens during the acute phase of infection. The lowest concentration of SARS-CoV-2 viral copies this assay can detect is 138 copies/mL. A negative result does not preclude SARS-Cov-2 infection and should  not be used as the sole basis for treatment or other patient management decisions. A negative result may occur with  improper specimen collection/handling, submission of specimen other than nasopharyngeal swab, presence of viral mutation(s) within the areas targeted by this assay, and inadequate number of viral copies(<138 copies/mL). A negative result must be combined with clinical observations, patient history, and epidemiological information. The expected result is Negative.  Fact Sheet for Patients:  EntrepreneurPulse.com.au  Fact Sheet for Healthcare Providers:  IncredibleEmployment.be  This test is no t yet approved or cleared by the [place] FDA and  has been authorized for detection and/or diagnosis of SARS-CoV-2 by FDA under an Emergency Use Authorization (EUA). This EUA will remain  in effect (meaning this test can be used) for the duration of the COVID-19 declaration under Section 564(b)(1) of the Act, 21 U.S.C.section 360bbb-3(b)(1), unless the authorization is terminated  or revoked sooner.       Influenza A by PCR NEGATIVE NEGATIVE Final   Influenza B by PCR NEGATIVE NEGATIVE Final    Comment: (NOTE) The Xpert Xpress SARS-CoV-2/FLU/RSV plus assay is intended as an aid in the diagnosis of influenza from Nasopharyngeal swab specimens and should not be used as a sole basis for treatment. Nasal washings and aspirates are unacceptable for Xpert Xpress SARS-CoV-2/FLU/RSV testing.  Fact Sheet for Patients: EntrepreneurPulse.com.au  Fact Sheet for Healthcare Providers: IncredibleEmployment.be  This test is not yet approved or cleared by the  [place] FDA and has been authorized for detection and/or diagnosis of SARS-CoV-2 by FDA under an Emergency Use Authorization (EUA). This EUA will remain in effect (meaning this test can be used) for the duration of the COVID-19 declaration under Section 564(b)(1) of the Act, 21 U.S.C. section 360bbb-3(b)(1), unless the authorization is terminated or revoked.  Performed at [facility], [place] [address], [place], [place] [address]   Culture, Respiratory w Gram Stain     Status: Santiago (Preliminary result)   Collection Time: 08/20/21 10:44 AM   Specimen: Tracheal Aspirate; Respiratory  Result Value Ref Range Status   Specimen Description TRACHEAL ASPIRATE  Final   Special Requests Santiago  Final   Gram Stain   Final    MODERATE SQUAMOUS EPITHELIAL CELLS PRESENT ABUNDANT WBC PRESENT, PREDOMINANTLY PMN MODERATE GRAM POSITIVE COCCI MODERATE GRAM NEGATIVE RODS RARE GRAM POSITIVE RODS    Culture   Final    CULTURE REINCUBATED FOR BETTER GROWTH Performed at [facility], [place] [address], [place], [place] [address]    Report Status PENDING  Incomplete         Radiology Studies: DG Shoulder Right Port  Result Date: 08/21/2021 CLINICAL DATA:  Pain in right shoulder. EXAM: PORTABLE RIGHT SHOULDER COMPARISON:  Santiago. FINDINGS: There is no sign of acute fracture or dislocation. Bones appear diffusely osteopenic. Mild AC joint degenerative change. IMPRESSION: 1. No acute findings. 2. Mild AC joint osteoarthritis. Electronically Signed   By: Kerby Moors M.D.   On: 08/21/2021 19:00     Scheduled Meds:  allopurinol  100 mg Per Tube Daily   amLODipine  5 mg Per Tube Daily   aspirin  81 mg Per Tube Daily   budesonide  0.25 mg Nebulization BID   cefdinir  300 mg Oral Q12H   chlorhexidine  15 mL Mouth Rinse BID   citalopram  60 mg Per Tube Daily   feeding supplement (OSMOLITE 1.5 CAL)  355 mL Per Tube TID WC & HS   free water  100  mL Per Tube QID   guaiFENesin  10 mL Per Tube  Q8H   insulin aspart  0-9 Units Subcutaneous TID WC   ipratropium-albuterol  3 mL Nebulization BID   mouth rinse  15 mL Mouth Rinse q12n4p   melatonin  5 mg Oral QHS   pantoprazole sodium  40 mg Per Tube BID   scopolamine  1 patch Transdermal Q72H   senna-docusate  1 tablet Per Tube BID   Continuous Infusions:     LOS: 4 days   Time spent= 35 mins    Marjean Imperato Arsenio Loader, Christopher [facility]  If 7PM-7AM, please contact night-coverage  08/23/2021, 10:34 AM

## 2021-08-23 NOTE — Progress Notes (Addendum)
[facility] Liaison Note  Received phone call from Whitman Hero, RN with [facility] stating that family cannot receive patient until tomorrow at this time. Now requesting hospital bed and nebulizer to be delivered to the home.  Hospital bed and nebulizer ordered and should be delivered to the home early this afternoon. TRACH MASK WILL NEED TO ACCOMPANY PATIENT HOME.  Referral intake notified of delay in discharge and will reach out to reschedule assessment visit for hospice admission with the family.  Please call with any hospice related questions or concerns.  Thank you. Margaretmary Eddy, BSN, RN [facility] Liaison  [phone]

## 2021-08-23 NOTE — Progress Notes (Signed)
Patient removed trach.  Trach replaced using obturator without incident.  Sats upper 90's throughout.  Patient in no distress. Positive color change on CO2 detector.  Inner cannulla changed, RN at bedside.

## 2021-08-23 NOTE — Progress Notes (Signed)
Pt not on unit at this time. RT to do routine trach check once patient arrives back to room.

## 2021-08-24 LAB — CULTURE, RESPIRATORY W GRAM STAIN: Culture: NORMAL

## 2021-08-24 LAB — CULTURE, BLOOD (ROUTINE X 2)
Culture: NO GROWTH
Culture: NO GROWTH
Special Requests: ADEQUATE

## 2021-08-24 LAB — BASIC METABOLIC PANEL
Anion gap: 8 (ref 5–15)
BUN: 23 mg/dL (ref 8–23)
CO2: 30 mmol/L (ref 22–32)
Calcium: 8.3 mg/dL — ABNORMAL LOW (ref 8.9–10.3)
Chloride: 96 mmol/L — ABNORMAL LOW (ref 98–111)
Creatinine, Ser: 0.79 mg/dL (ref 0.61–1.24)
GFR, Estimated: >60 mL/min (ref >60)
Glucose, Bld: 123 mg/dL — ABNORMAL HIGH (ref 70–99)
Potassium: 4.7 mmol/L (ref 3.5–5.1)
Sodium: 134 mmol/L — ABNORMAL LOW (ref 135–145)

## 2021-08-24 LAB — CBC
HCT: 25.8 % — ABNORMAL LOW (ref 39.0–52.0)
Hemoglobin: 8.3 g/dL — ABNORMAL LOW (ref 13.0–17.0)
MCH: 30 pg (ref 26.0–34.0)
MCHC: 32.2 g/dL (ref 30.0–36.0)
MCV: 93.1 fL (ref 80.0–100.0)
Platelets: 192 10*3/uL (ref 150–400)
RBC: 2.77 MIL/uL — ABNORMAL LOW (ref 4.22–5.81)
RDW: 13.7 % (ref 11.5–15.5)
WBC: 10.2 10*3/uL (ref 4.0–10.5)
nRBC: 0 % (ref 0.0–0.2)

## 2021-08-24 LAB — MAGNESIUM: Magnesium: 2 mg/dL (ref 1.7–2.4)

## 2021-08-24 LAB — GLUCOSE, CAPILLARY: Glucose-Capillary: 78 mg/dL (ref 70–99)

## 2021-08-24 NOTE — Progress Notes (Signed)
   08/23/21 2350  Assess: MEWS Score  Pulse Rate 83  Resp (!) 24  SpO2 94 % (was 79% on room air pt had pulled atc off)  O2 Device Tracheostomy Collar  O2 Flow Rate (L/min) 8 L/min  FiO2 (%) 35 %  Assess: MEWS Score  MEWS Temp 0  MEWS Systolic 1  MEWS Pulse 0  MEWS RR 1  MEWS LOC 0  MEWS Score 2  MEWS Score Color Yellow  Assess: if the MEWS score is Yellow or Red  Were vital signs taken at a resting state? Yes  Focused Assessment Change from prior assessment (see assessment flowsheet)  Early Detection of Sepsis Score *See Row Information* Low  MEWS guidelines implemented *See Row Information* Yes  Treat  MEWS Interventions Administered prn meds/treatments  Take Vital Signs  Increase Vital Sign Frequency  Yellow: Q 2hr X 2 then Q 4hr X 2, if remains yellow, continue Q 4hrs  Escalate  MEWS: Escalate Yellow: discuss with charge nurse/RN and consider discussing with provider and RRT  Notify: Charge Nurse/RN  Name of Charge Nurse/RN Notified Kimberly,RN  Date Charge Nurse/RN Notified 08/23/21  Time Charge Nurse/RN Notified 2350  Document  Patient Outcome Not stable and remains on department  Progress note created (see row info) Yes

## 2021-08-24 NOTE — Plan of Care (Signed)

## 2021-08-24 NOTE — Progress Notes (Signed)
DISCHARGE NOTE HOME KAIEL WEIDE to be discharged Home per MD order. Discussed prescriptions and follow up appointments with the patient. Prescriptions given to patient; medication list explained in detail. Patient verbalized understanding.  Skin clean, dry and intact without evidence of skin break down, no evidence of skin tears noted. IV catheter discontinued intact. Site without signs and symptoms of complications. Dressing and pressure applied. Pt denies pain at the site currently. No complaints noted.  Patient free of lines, drains, and wounds.   An After Visit Summary (AVS) was printed and given to the patient. Patient escorted via stretcher, and discharged home via [place].  Vira Agar, RN

## 2021-08-24 NOTE — Progress Notes (Signed)
Pt noted with 3 beat run of PVCs. Vital signs stable, no distress noted.

## 2021-08-24 NOTE — TOC Transition Note (Signed)
Transition of Care [facility]) - CM/SW Discharge Note   Patient Details  Name: Christopher Santiago MRN: [id number] Date of Birth: 1931-02-19  [facility]) CM/SW Contact:  Sharin Mons, RN Phone Number: 08/24/2021, 9:26 AM   Clinical Narrative:    Patient will DC to: Home Anticipated DC date: 08/24/2021 Family notified: yes,wife Transport by: car   Per MD patient ready for DC today. RN, patient, patient's family, and [facility] notified of DC. [facility]Ambulance transport requested for patient, time pick up for 10:00 am.TRANSPORT PAPERS ON FRONT OF CHART. Wife states all DME has been delivered to  the home. No Rx MED concern. Intake by nurse/[facility] will be done today  once pt has arrived home.  F/U appointment noted on AVS with PCP.  RNCM will sign off for now as intervention is no longer needed. Please consult [place] again if new needs arise.    Final next level of care: Home w Hospice Care Barriers to Discharge: No Barriers Identified   Patient Goals and CMS Choice Patient states their goals for this hospitalization and ongoing recovery are:: To go home with Hospice      Discharge Placement                       Discharge Plan and Services   Discharge Planning Services: CM Consult Post Acute Care Choice: Hospice            DME Agency: [employer] Date DME Agency Contacted: 08/21/21 Time DME Agency Contacted: 88 Representative spoke with at DME Agency: [place]: RN, PT, OT, Nurse's Aide, Respirator Therapy, Speech Therapy HH Agency: [place] Date [place]: 08/21/21 Time [place]: 60 Representative spoke with at [place]: [place]) Interventions     Readmission Risk Interventions No flowsheet data found.

## 2021-08-24 NOTE — Discharge Summary (Signed)
Physician Discharge Summary  Christopher Santiago LNL:[id number] DOB: 07-29-1931 DOA: 08/19/2021  PCP: Tonia Ghent, MD  Admit date: 08/19/2021 Discharge date: 08/24/2021  Admitted From: Home Disposition: Home with hospice  Recommendations for Outpatient Follow-up:  Follow up with PCP in 1-2 weeks Please obtain BMP/CBC in one week your next doctors visit.  3 more days of cefdinir twice daily to complete 7-day course Duo nebs scheduled and as needed prescribed, nebulizer machine provided as well Discontinuing Brovana, Pulmicort and Yupelri.  Family and patient cannot afford this Hospitalist team to continue to follow the patient Unfortunately patient could not tolerate off being scopolamine as he started having copious amount of thin secretions therefore had to be restarted.  If necessary can try outpatient Robinul as a replacement or in addition.  Follow-up outpatient trach clinic if necessary with pulmonary, [place]   Discharge Condition: Stable CODE STATUS: DNR Diet recommendation: PEG tube feeding  Brief/Interim Summary: 87 y.o. male with history h/o COPD, type 2 diabetes mellitus, essential hypertension, laryngeal cancer ->recently had prolonged hospitalization 07/01/21-08/12/21 for multifactorial acute respiratory failure, treated for COPD exacerbation, aspiration pneumonia and also underwent tracheostomy by ENT for recurrent squamous cell laryngeal cancer-> was discharged home with instructions to use scheduled Brovana/budesonide/Yupleri nebs as well as as needed albuterol neb, trach care and oral suction frequently. Patient returned to the ED on 9/22 for dislodged tracheostomy tube, replaced and discharged home with antibiotics (Keflex) for UTI. patient with significant respiratory distress, wife states she tried to suction his trach but he had very thick secretions.  She states she did the suction tube and water and tried to suction again through the trach to make the secretions softer.   Ultimately patient was brought to the ER. He was seen by pulmonary team and swtiched to PO Abx.  Over the course of hospital stay patient did have increasing amount of thin secretions after discontinuing scopolamine patch.  Case was rediscussed with pulmonary and restarted scopolamine patch with Robinul as necessary.  Patient also ended up pulling out his PEG tube on 10/1 and had to be replaced by interventional radiology.  Patient and family were interested in hospice therefore arrangements made with home hospice.  Necessary equipments have been delivered. There was affordability issue with bronchodilators previously provided, pulmonary recommended switching patient over to routine nebulizers therefore arrangements made.     Assessment & Plan:   Principal Problem:   Acute on chronic respiratory failure with hypoxia (HCC) Active Problems:   Gout   Essential hypertension   Coronary atherosclerosis   PVD (peripheral vascular disease) (HCC)   Mixed hyperlipidemia   Ascending aorta dilation (HCC)   Pulmonary hypertension, unspecified ([place])   BPH with urinary obstruction   Tracheostomy status ([place])   Malignant neoplasm of supraglottis ([place])   Dementia ([place])   Tracheitis   Hospice care patient   Acute hypoxic respiratory failure, recurrent aspiration/tracheitis:  Tracheostomy replaced by pulmonary team.  IV antibiotics has been switched to oral cefdinir, will complete 7-day course.  Initially his scopolamine patch was discontinued due to thick secretions but thereafter he started having very thin copious amount of secretions.  Case was discussed with pulmonary again and was started on scopolamine patch again with plans to use Robinul as necessary.  Can follow-up patient with tracheostomy/trach clinic  Displaced PEG tube -Patient admitted pulling out his PEG tube on 10/1 which was replaced by interventional radiology.  Functioning well today.   COPD, obstructive sleep apnea, pulmonary  hypertension:  -Continue as  needed bronchodilators.  Will discontinue Candiss Norse which was expensive for the family therefore we will place patient on duo nebs twice daily and as needed.  DME nebulizer machine has been ordered.   Acute metabolic encephalopathy, delirium with underlying dementia:  - Doing much better.  On melatonin at bedtime Seroquel for agitation   Falls: Seen by wound care team   CAD, PVD, HTN: Currently patient is chest pain-free, continue aspirin and Norvasc.   LVH, stage I diastolic dysfunction:  -No signs of hemodynamic instability.  Continue to monitor at this time.   Hyponatremia, adrenal adenoma:  - Improved with fluids.   Diabetes mellitus type 2:  -Not on any medications and hemoglobin A1c 5.8 while hospitalized recently.  Monitor blood glucose levels while on tube feeds.  Sliding scale insulin    History of DVT/PE:  -Not on any anticoagulation.  Noted history of duodenal ulcer with hemorrhage in 2021 and s/p IVC filter   Laryngeal mass, RUL pulmonary nodule:  -Follows ENT.  Now s/p trach.  Outpatient follow-up with oncology set up in last hospitalization.     Gout: - Resume allopurinol   BPH: - Resume home medications.   Duodenal ulcers : - Resume PPI.  No signs of GI bleed currently.   Hospitalist team to follow patient at home.  Equipments arranged at home by hospice team as requested by the family.    There is no height or weight on file to calculate BMI.         Discharge Diagnoses:  Principal Problem:   Acute on chronic respiratory failure with hypoxia (HCC) Active Problems:   Gout   Essential hypertension   Coronary atherosclerosis   PVD (peripheral vascular disease) (HCC)   Mixed hyperlipidemia   Ascending aorta dilation ([place])   Pulmonary hypertension, unspecified ([place])   BPH with urinary obstruction   Tracheostomy status ([place])   Malignant neoplasm of supraglottis ([place])   Dementia ([place])   Tracheitis   Hospice care  patient      Consultations: Pulmonary Interventional radiology  Subjective: This morning had quite a bit of secretion which was suctioned.  By the time I saw the patient he was resting comfortably with minimal secretions  Discharge Exam: Vitals:   08/24/21 0521 08/24/21 0812  BP: (!) 112/50   Pulse: 62 61  Resp: 16 18  Temp: 98.8 F (37.1 C)   SpO2: 98% 98%   Vitals:   08/24/21 0327 08/24/21 0358 08/24/21 0521 08/24/21 0812  BP:  (!) 118/50 (!) 112/50   Pulse: 64 65 62 61  Resp: 16 16 16 18   Temp:  99 F (37.2 C) 98.8 F (37.1 C)   TempSrc:  Oral Oral   SpO2: 97% 96% 98% 98%    General: Appears chronically ill Cardiovascular: RRR, S1/S2 +, no rubs, no gallops Respiratory: CTA bilaterally, no wheezing, no rhonchi Abdominal: Soft, NT, ND, bowel sounds + Extremities: no edema, no cyanosis  Tracheostomy and PEG tube in place  Discharge Instructions   Allergies as of 08/24/2021       Reactions   Tessalon [benzonatate] Other (See Comments)   Ineffective, nasal congestion        Medication List     STOP taking these medications    arformoterol 15 MCG/2ML Nebu Commonly known as: BROVANA   budesonide 0.25 MG/2ML nebulizer solution Commonly known as: PULMICORT   cephALEXin 250 MG/5ML suspension Commonly known as: KEFLEX   metoprolol succinate 25 MG 24 hr tablet Commonly known  as: TOPROL-XL   revefenacin 175 MCG/3ML nebulizer solution Commonly known as: YUPELRI       TAKE these medications    albuterol 108 (90 Base) MCG/ACT inhaler Commonly known as: Ventolin HFA Inhale 1-2 puffs into the lungs every 6 (six) hours as needed for wheezing or shortness of breath (okay to fill with albuterol/proair/ventolin).   allopurinol 100 MG tablet Commonly known as: ZYLOPRIM Place 1 tablet (100 mg total) into feeding tube daily.   amLODipine 5 MG tablet Commonly known as: NORVASC Place 1 tablet (5 mg total) into feeding tube daily.   aspirin 81 MG  chewable tablet Commonly known as: Aspirin Children[facility] Place 1 tablet (81 mg total) into feeding tube daily.   cefdinir 300 MG capsule Commonly known as: OMNICEF Take 1 capsule (300 mg total) by mouth every 12 (twelve) hours for 3 days.   citalopram 40 MG tablet Commonly known as: CELEXA Place 1.5 tablets (60 mg total) into feeding tube daily.   feeding supplement (OSMOLITE 1.5 CAL) Liqd Place 355 mLs into feeding tube 4 (four) times daily -  with meals and at bedtime.   free water Soln Place 150 mLs into feeding tube 4 (four) times daily.   furosemide 40 MG tablet Commonly known as: LASIX Place 1 tablet (40 mg total) into feeding tube daily.   guaiFENesin 100 MG/5ML Soln Commonly known as: ROBITUSSIN Take 10 mLs (200 mg total) by mouth every 8 (eight) hours. What changed: how to take this   hydrOXYzine 10 MG tablet Commonly known as: ATARAX/VISTARIL Take 1 tablet (10 mg total) by mouth 3 (three) times daily as needed (for agitation or oral secretions.  sedation caution.). What changed:  how to take this reasons to take this   ipratropium-albuterol 0.5-2.5 (3) MG/3ML Soln Commonly known as: DUONEB Take 3 mLs by nebulization every 4 (four) hours as needed.   ipratropium-albuterol 0.5-2.5 (3) MG/3ML Soln Commonly known as: DUONEB Take 3 mLs by nebulization 2 (two) times daily.   LORazepam 0.5 MG tablet Commonly known as: ATIVAN Place 1 tablet (0.5 mg total) into feeding tube every 8 (eight) hours as needed for anxiety (for agitation, use if no relief with hydroxyzine, sedation caution).   melatonin 5 MG Tabs Place 1 tablet (5 mg total) into feeding tube at bedtime as needed.   oxycodone 5 MG capsule Commonly known as: OXY-IR Place 1 capsule (5 mg total) into feeding tube every 4 (four) hours as needed.   pantoprazole sodium 40 mg Commonly known as: PROTONIX Place 40 mg into feeding tube 2 (two) times daily.   QUEtiapine 25 MG tablet Commonly known as:  SEROQUEL Place 1 tablet (25 mg total) into feeding tube 2 (two) times daily as needed (agitation).   scopolamine 1 MG/3DAYS Commonly known as: TRANSDERM-SCOP Place 1 patch (1.5 mg total) onto the skin every 3 (three) days.   senna-docusate 8.6-50 MG tablet Commonly known as: Senokot-S Place 1 tablet into feeding tube 2 (two) times daily. What changed: when to take this               Durable Medical Equipment  (From admission, onward)           Start     Ordered   08/23/21 1024  For home use only DME Nebulizer machine  Once       Question Answer Comment  Patient needs a nebulizer to treat with the following condition COPD exacerbation ([place])   Length of Need Lifetime  08/23/21 1023            Follow-up Information     Tonia Ghent, MD Follow up in 1 week(s).   Specialty: Family Medicine Contact information: [place] [address] [phone]                Allergies  Allergen Reactions   Tessalon [Benzonatate] Other (See Comments)    Ineffective, nasal congestion    You were cared for by a hospitalist during your hospital stay. If you have any questions about your discharge medications or the care you received while you were in the hospital after you are discharged, you can call the unit and asked to speak with the hospitalist on call if the hospitalist that took care of you is not available. Once you are discharged, your primary care physician will handle any further medical issues. Please note that no refills for any discharge medications will be authorized once you are discharged, as it is imperative that you return to your primary care physician (or establish a relationship with a primary care physician if you do not have one) for your aftercare needs so that they can reassess your need for medications and monitor your lab values.   Procedures/Studies: DG Chest 2 View  Result Date: 08/19/2021 CLINICAL DATA:   Hypoxia. EXAM: CHEST - 2 VIEW COMPARISON:  August 01, 2021. FINDINGS: The heart size and mediastinal contours are within normal limits. Both lungs are clear. The visualized skeletal structures are unremarkable. IMPRESSION: No active cardiopulmonary disease. Electronically Signed   By: Marijo Conception M.D.   On: 08/19/2021 09:27   DG Thoracic Spine 2 View  Result Date: 08/19/2021 CLINICAL DATA:  Fall. EXAM: THORACIC SPINE 2 VIEWS COMPARISON:  February 10, 2016.  July 10, 2021. FINDINGS: Old T12 compression fracture is noted. Old T8 compression fracture is noted. No definite acute fracture or spondylolisthesis is noted. Anterior osteophyte formation is noted at multiple levels in the lower thoracic spine. IMPRESSION: Old T8 and T12 fractures.  No definite acute abnormality is noted. Electronically Signed   By: Marijo Conception M.D.   On: 08/19/2021 09:26   DG Elbow Complete Left  Result Date: 08/19/2021 CLINICAL DATA:  Fall, 87 year old male. EXAM: LEFT ELBOW - COMPLETE 3+ VIEW COMPARISON:  Contralateral elbow of the same date. FINDINGS: There is no evidence of fracture, dislocation, or joint effusion. There is no evidence of arthropathy or other focal bone abnormality. Soft tissues are unremarkable. IMPRESSION: Negative evaluation of the LEFT elbow. Electronically Signed   By: Zetta Bills M.D.   On: 08/19/2021 09:21   DG Elbow Complete Right  Result Date: 08/19/2021 CLINICAL DATA:  Right elbow pain after fall. EXAM: RIGHT ELBOW - COMPLETE 3+ VIEW COMPARISON:  November 18, 2010. FINDINGS: There is no evidence of fracture, dislocation, or joint effusion. There is no evidence of arthropathy or other focal bone abnormality. Soft tissues are unremarkable. IMPRESSION: Negative. Electronically Signed   By: Marijo Conception M.D.   On: 08/19/2021 09:23   CT HEAD WO CONTRAST (5MM)  Result Date: 08/19/2021 CLINICAL DATA:  Head trauma, minor (Age >= 65y) fall.  Fall. EXAM: CT HEAD WITHOUT CONTRAST TECHNIQUE:  Contiguous axial images were obtained from the base of the skull through the vertex without intravenous contrast. COMPARISON:  Maxillofacial and neck CTs 07/02/2021. Head CT 08/29/2019. FINDINGS: The study is mildly motion degraded despite repeat imaging. Brain: Within limitations of motion, no acute infarct, intracranial hemorrhage,  mass, midline shift, or extra-axial fluid collection is identified. A small chronic cortical infarct in the left temporo-occipital region is unchanged. There is mild cerebral atrophy. Hypodensities in the cerebral white matter bilaterally are stable to slightly increased from 2020 and are nonspecific but compatible with mild chronic small vessel ischemic disease. Vascular: Calcified atherosclerosis at the skull base. No hyperdense vessel. Skull: No acute fracture or suspicious osseous lesion. Sinuses/Orbits: Remote medial left orbital fracture. Bilateral cataract extraction. Paranasal sinuses and mastoid air cells are clear. Other: None. IMPRESSION: 1. No evidence of acute intracranial abnormality. 2. Mild chronic small vessel ischemic disease. Electronically Signed   By: Logan Bores M.D.   On: 08/19/2021 07:55   IR Replc Gastro/Colonic Tube Percut W/Fluoro  Result Date: 08/24/2021 INDICATION: Dislodged gastrostomy tube EXAM: Replacement of gastrostomy tube using fluoroscopic guidance MEDICATIONS: None ANESTHESIA/SEDATION: Local analgesia CONTRAST:  10 mL-administered into the gastric lumen. FLUOROSCOPY TIME:  Fluoroscopy Time: 2 minutes with 5 exposures COMPLICATIONS: None immediate. PROCEDURE: Informed written consent was obtained from the patient after a thorough discussion of the procedural risks, benefits and alternatives. All questions were addressed. Maximal Sterile Barrier Technique was utilized including caps, mask, sterile gowns, sterile gloves, sterile drape, hand hygiene and skin antiseptic. A timeout was performed prior to the initiation of the procedure. The patient  was placed supine on the exam table. The mid abdomen was prepped and draped in a standard sterile fashion with inclusion of the existing gastrostomy defect within the sterile field. The gastrostomy defect was initially catheterized with a angled 5 French catheter. Initial passage of the catheter and injection of contrast material demonstrated extraluminal location within the peritoneum. The catheter was withdrawn and redirected, and eventually the catheter was able to be advanced into the gastric lumen. Injection of contrast material confirmed location. An Amplatz wire was advanced into the stomach and looped within the gastric body. The percutaneous tract was then serially dilated up to 20 [place] after the administration of local analgesia. Subsequently, a new 73 French balloon gastrostomy tube was then advanced into the gastric lumen. The retention balloon was inflated with approximately 18 mL of sterile water and brought back to the gastric wall. The external bumper was brought to the skin. Injection of the new gastrostomy tube demonstrated appropriate location within the gastric lumen. It was secured to the skin with a clean dressing. The patient tolerated the procedure well without immediate complication. IMPRESSION: Successful replacement of percutaneous gastrostomy tube with a new 20 French balloon gastrostomy tube using fluoroscopic guidance. The tube is ready for immediate use. Electronically Signed   By: Albin Felling M.D.   On: 08/24/2021 09:09   DG CHEST PORT 1 VIEW  Result Date: 08/01/2021 CLINICAL DATA:  Shortness of breath, history of COPD EXAM: PORTABLE CHEST 1 VIEW COMPARISON:  Chest radiograph 07/29/2021 FINDINGS: The tracheostomy tube is stable The cardiomediastinal silhouette is stable, with unchanged calcified atherosclerotic plaque of the thoracic aorta. Linear opacities in the left base likely reflect subsegmental atelectasis. There is no new focal airspace disease. The nodular density in  the right upper lobe is unchanged, consistent with a calcified granuloma. There is no significant pleural effusion or pneumothorax. There is no acute osseous abnormality. IMPRESSION: Stable chest with no new focal airspace disease. Electronically Signed   By: Valetta Mole M.D.   On: 08/01/2021 09:30   DG CHEST PORT 1 VIEW  Result Date: 07/29/2021 CLINICAL DATA:  Shortness of breath. EXAM: PORTABLE CHEST 1 VIEW COMPARISON:  07/06/2021 FINDINGS: 5277  hours. Tracheostomy tube again noted. No pulmonary edema. Tiny nodular density right upper lobe likely calcified granuloma, stable. There is some atelectasis or infiltrate at the left base. No pneumothorax. Bones are diffusely demineralized. Telemetry leads overlie the chest. IMPRESSION: 1. Question atelectasis or infiltrate at the left base. Otherwise no acute cardiopulmonary findings. 2. Stable right upper lobe calcified granuloma. Electronically Signed   By: Misty Stanley M.D.   On: 07/29/2021 12:47   DG Shoulder Right Port  Result Date: 08/21/2021 CLINICAL DATA:  Pain in right shoulder. EXAM: PORTABLE RIGHT SHOULDER COMPARISON:  None. FINDINGS: There is no sign of acute fracture or dislocation. Bones appear diffusely osteopenic. Mild AC joint degenerative change. IMPRESSION: 1. No acute findings. 2. Mild AC joint osteoarthritis. Electronically Signed   By: Kerby Moors M.D.   On: 08/21/2021 19:00   DG Knee Complete 4 Views Left  Result Date: 08/19/2021 CLINICAL DATA:  Fall EXAM: LEFT KNEE - COMPLETE 4+ VIEW COMPARISON:  None. FINDINGS: There is no acute fracture or dislocation. Bony alignment is normal. There is mild medial tibiofemoral joint space narrowing. There is no effusion. There are extensive vascular calcifications. IMPRESSION: No acute fracture or dislocation. Electronically Signed   By: Valetta Mole M.D.   On: 08/19/2021 09:22     The results of significant diagnostics from this hospitalization (including imaging, microbiology, ancillary and  laboratory) are listed below for reference.     Microbiology: Recent Results (from the past 240 hour(s))  Urine Culture     Status: Abnormal   Collection Time: 08/14/21  2:52 PM   Specimen: Urine, Clean Catch  Result Value Ref Range Status   Specimen Description URINE, CLEAN CATCH  Final   Special Requests   Final    NONE Performed at [facility], [place] [address], [place], [place] [address]    Culture >=100,000 COLONIES/mL PROTEUS MIRABILIS (A)  Final   Report Status 08/16/2021 FINAL  Final   Organism ID, Bacteria PROTEUS MIRABILIS (A)  Final      Susceptibility   Proteus mirabilis - MIC*    AMPICILLIN <=2 SENSITIVE Sensitive     CEFAZOLIN 8 SENSITIVE Sensitive     CEFEPIME <=0.12 SENSITIVE Sensitive     CEFTRIAXONE <=0.25 SENSITIVE Sensitive     CIPROFLOXACIN <=0.25 SENSITIVE Sensitive     GENTAMICIN <=1 SENSITIVE Sensitive     IMIPENEM 4 SENSITIVE Sensitive     NITROFURANTOIN 128 RESISTANT Resistant     TRIMETH/SULFA <=20 SENSITIVE Sensitive     AMPICILLIN/SULBACTAM <=2 SENSITIVE Sensitive     PIP/TAZO <=4 SENSITIVE Sensitive     * >=100,000 COLONIES/mL PROTEUS MIRABILIS  Culture, blood (routine x 2)     Status: None   Collection Time: 08/19/21 12:30 PM   Specimen: BLOOD  Result Value Ref Range Status   Specimen Description BLOOD LEFT ANTECUBITAL  Final   Special Requests   Final    BOTTLES DRAWN AEROBIC AND ANAEROBIC Blood Culture adequate volume   Culture   Final    NO GROWTH 5 DAYS Performed at [facility], [address], [place], [place] [address]    Report Status 08/24/2021 FINAL  Final  Culture, blood (routine x 2)     Status: None   Collection Time: 08/19/21 12:41 PM   Specimen: BLOOD  Result Value Ref Range Status   Specimen Description BLOOD RIGHT ANTECUBITAL  Final   Special Requests   Final    BOTTLES DRAWN AEROBIC AND ANAEROBIC Blood Culture results  may not be optimal due to an excessive volume of blood received in culture bottles    Culture   Final    NO GROWTH 5 DAYS Performed at [facility], [place] [address], [place], [place] [address]    Report Status 08/24/2021 FINAL  Final  Resp Panel by RT-PCR (Flu A&B, Covid) Nasopharyngeal Swab     Status: None   Collection Time: 08/19/21  3:04 PM   Specimen: Nasopharyngeal Swab; Nasopharyngeal(NP) swabs in vial transport medium  Result Value Ref Range Status   SARS Coronavirus 2 by RT PCR NEGATIVE NEGATIVE Final    Comment: (NOTE) SARS-CoV-2 target nucleic acids are NOT DETECTED.  The SARS-CoV-2 RNA is generally detectable in upper respiratory specimens during the acute phase of infection. The lowest concentration of SARS-CoV-2 viral copies this assay can detect is 138 copies/mL. A negative result does not preclude SARS-Cov-2 infection and should not be used as the sole basis for treatment or other patient management decisions. A negative result may occur with  improper specimen collection/handling, submission of specimen other than nasopharyngeal swab, presence of viral mutation(s) within the areas targeted by this assay, and inadequate number of viral copies(<138 copies/mL). A negative result must be combined with clinical observations, patient history, and epidemiological information. The expected result is Negative.  Fact Sheet for Patients:  EntrepreneurPulse.com.au  Fact Sheet for Healthcare Providers:  IncredibleEmployment.be  This test is no t yet approved or cleared by the [place] FDA and  has been authorized for detection and/or diagnosis of SARS-CoV-2 by FDA under an Emergency Use Authorization (EUA). This EUA will remain  in effect (meaning this test can be used) for the duration of the COVID-19 declaration under Section 564(b)(1) of the Act, 21 U.S.C.section 360bbb-3(b)(1), unless the authorization is terminated  or revoked sooner.       Influenza A by PCR NEGATIVE NEGATIVE Final   Influenza B by PCR  NEGATIVE NEGATIVE Final    Comment: (NOTE) The Xpert Xpress SARS-CoV-2/FLU/RSV plus assay is intended as an aid in the diagnosis of influenza from Nasopharyngeal swab specimens and should not be used as a sole basis for treatment. Nasal washings and aspirates are unacceptable for Xpert Xpress SARS-CoV-2/FLU/RSV testing.  Fact Sheet for Patients: EntrepreneurPulse.com.au  Fact Sheet for Healthcare Providers: IncredibleEmployment.be  This test is not yet approved or cleared by the [place] FDA and has been authorized for detection and/or diagnosis of SARS-CoV-2 by FDA under an Emergency Use Authorization (EUA). This EUA will remain in effect (meaning this test can be used) for the duration of the COVID-19 declaration under Section 564(b)(1) of the Act, 21 U.S.C. section 360bbb-3(b)(1), unless the authorization is terminated or revoked.  Performed at [facility], [place] [address], [place], [place] [address]   Culture, Respiratory w Gram Stain     Status: None   Collection Time: 08/20/21 10:44 AM   Specimen: Tracheal Aspirate; Respiratory  Result Value Ref Range Status   Specimen Description TRACHEAL ASPIRATE  Final   Special Requests NONE  Final   Gram Stain   Final    MODERATE SQUAMOUS EPITHELIAL CELLS PRESENT ABUNDANT WBC PRESENT, PREDOMINANTLY PMN MODERATE GRAM POSITIVE COCCI MODERATE GRAM NEGATIVE RODS RARE GRAM POSITIVE RODS    Culture   Final    RARE Normal respiratory flora-no Staph aureus or Pseudomonas seen Performed at [facility], [place] [address] [place], [place] [address]    Report Status 08/24/2021 FINAL  Final  Labs: BNP (last 3 results) Recent Labs    07/01/21 1729  BNP 416.3*   Basic Metabolic Panel: Recent Labs  Lab 08/20/21 0140 08/21/21 0614 08/22/21 0206 08/23/21 0356 08/24/21 0257  NA 128* 132* 132* 132* 134*  K 4.6 4.7 4.5 4.3 4.7  CL 94* 94* 95* 95* 96*  CO2 28 28 30  32 30   GLUCOSE 194* 101* 132* 126* 123*  BUN 31* 21 20 20 23   CREATININE 0.78 0.75 0.66 0.69 0.79  CALCIUM 8.3* 8.6* 8.5* 8.4* 8.3*  MG  --  2.1 2.4 2.1 2.0   Liver Function Tests: No results for input(s): AST, ALT, ALKPHOS, BILITOT, PROT, ALBUMIN in the last 168 hours. No results for input(s): LIPASE, AMYLASE in the last 168 hours. No results for input(s): AMMONIA in the last 168 hours. CBC: Recent Labs  Lab 08/19/21 0856 08/20/21 0140 08/21/21 0614 08/22/21 0206 08/23/21 0356 08/24/21 0257  WBC 17.2* 13.7* 12.7* 11.9* 10.1 10.2  NEUTROABS 14.9*  --   --   --   --   --   HGB 10.8* 8.2* 9.7* 9.1* 8.4* 8.3*  HCT 34.0* 26.6* 31.4* 28.4* 26.5* 25.8*  MCV 94.4 95.7 95.4 94.7 93.6 93.1  PLT 193 155 184 177 201 192   Cardiac Enzymes: No results for input(s): CKTOTAL, CKMB, CKMBINDEX, TROPONINI in the last 168 hours. BNP: Invalid input(s): POCBNP CBG: Recent Labs  Lab 08/22/21 2138 08/23/21 0610 08/23/21 1131 08/23/21 2324 08/24/21 0656  GLUCAP 162* 102* 144* 214* 78   D-Dimer No results for input(s): DDIMER in the last 72 hours. Hgb A1c No results for input(s): HGBA1C in the last 72 hours. Lipid Profile No results for input(s): CHOL, HDL, LDLCALC, TRIG, CHOLHDL, LDLDIRECT in the last 72 hours. Thyroid function studies No results for input(s): TSH, T4TOTAL, T3FREE, THYROIDAB in the last 72 hours.  Invalid input(s): FREET3 Anemia work up No results for input(s): VITAMINB12, FOLATE, FERRITIN, TIBC, IRON, RETICCTPCT in the last 72 hours. Urinalysis    Component Value Date/Time   COLORURINE YELLOW 08/14/2021 1301   APPEARANCEUR CLOUDY (A) 08/14/2021 1301   LABSPEC 1.010 08/14/2021 1301   PHURINE 8.5 (H) 08/14/2021 1301   GLUCOSEU NEGATIVE 08/14/2021 1301   HGBUR NEGATIVE 08/14/2021 1301   BILIRUBINUR NEGATIVE 08/14/2021 1301   KETONESUR NEGATIVE 08/14/2021 1301   PROTEINUR 100 (A) 08/14/2021 1301   UROBILINOGEN 0.2 08/31/2009 1233   NITRITE POSITIVE (A) 08/14/2021  1301   LEUKOCYTESUR LARGE (A) 08/14/2021 1301   Sepsis Labs Invalid input(s): PROCALCITONIN,  WBC,  LACTICIDVEN Microbiology Recent Results (from the past 240 hour(s))  Urine Culture     Status: Abnormal   Collection Time: 08/14/21  2:52 PM   Specimen: Urine, Clean Catch  Result Value Ref Range Status   Specimen Description URINE, CLEAN CATCH  Final   Special Requests   Final    NONE Performed at [facility], [place] [address] [place], [place] [address]    Culture >=100,000 COLONIES/mL PROTEUS MIRABILIS (A)  Final   Report Status 08/16/2021 FINAL  Final   Organism ID, Bacteria PROTEUS MIRABILIS (A)  Final      Susceptibility   Proteus mirabilis - MIC*    AMPICILLIN <=2 SENSITIVE Sensitive     CEFAZOLIN 8 SENSITIVE Sensitive     CEFEPIME <=0.12 SENSITIVE Sensitive     CEFTRIAXONE <=0.25 SENSITIVE Sensitive     CIPROFLOXACIN <=0.25 SENSITIVE Sensitive     GENTAMICIN <=1 SENSITIVE Sensitive     IMIPENEM 4 SENSITIVE  Sensitive     NITROFURANTOIN 128 RESISTANT Resistant     TRIMETH/SULFA <=20 SENSITIVE Sensitive     AMPICILLIN/SULBACTAM <=2 SENSITIVE Sensitive     PIP/TAZO <=4 SENSITIVE Sensitive     * >=100,000 COLONIES/mL PROTEUS MIRABILIS  Culture, blood (routine x 2)     Status: None   Collection Time: 08/19/21 12:30 PM   Specimen: BLOOD  Result Value Ref Range Status   Specimen Description BLOOD LEFT ANTECUBITAL  Final   Special Requests   Final    BOTTLES DRAWN AEROBIC AND ANAEROBIC Blood Culture adequate volume   Culture   Final    NO GROWTH 5 DAYS Performed at [facility], [place] [address] [place], [place] [address]    Report Status 08/24/2021 FINAL  Final  Culture, blood (routine x 2)     Status: None   Collection Time: 08/19/21 12:41 PM   Specimen: BLOOD  Result Value Ref Range Status   Specimen Description BLOOD RIGHT ANTECUBITAL  Final   Special Requests   Final    BOTTLES DRAWN AEROBIC AND ANAEROBIC Blood Culture results may not be optimal due to  an excessive volume of blood received in culture bottles   Culture   Final    NO GROWTH 5 DAYS Performed at [facility], [place] [address] [place], [place] [address]    Report Status 08/24/2021 FINAL  Final  Resp Panel by RT-PCR (Flu A&B, Covid) Nasopharyngeal Swab     Status: None   Collection Time: 08/19/21  3:04 PM   Specimen: Nasopharyngeal Swab; Nasopharyngeal(NP) swabs in vial transport medium  Result Value Ref Range Status   SARS Coronavirus 2 by RT PCR NEGATIVE NEGATIVE Final    Comment: (NOTE) SARS-CoV-2 target nucleic acids are NOT DETECTED.  The SARS-CoV-2 RNA is generally detectable in upper respiratory specimens during the acute phase of infection. The lowest concentration of SARS-CoV-2 viral copies this assay can detect is 138 copies/mL. A negative result does not preclude SARS-Cov-2 infection and should not be used as the sole basis for treatment or other patient management decisions. A negative result may occur with  improper specimen collection/handling, submission of specimen other than nasopharyngeal swab, presence of viral mutation(s) within the areas targeted by this assay, and inadequate number of viral copies(<138 copies/mL). A negative result must be combined with clinical observations, patient history, and epidemiological information. The expected result is Negative.  Fact Sheet for Patients:  EntrepreneurPulse.com.au  Fact Sheet for Healthcare Providers:  IncredibleEmployment.be  This test is no t yet approved or cleared by the [place] FDA and  has been authorized for detection and/or diagnosis of SARS-CoV-2 by FDA under an Emergency Use Authorization (EUA). This EUA will remain  in effect (meaning this test can be used) for the duration of the COVID-19 declaration under Section 564(b)(1) of the Act, 21 U.S.C.section 360bbb-3(b)(1), unless the authorization is terminated  or revoked sooner.        Influenza A by PCR NEGATIVE NEGATIVE Final   Influenza B by PCR NEGATIVE NEGATIVE Final    Comment: (NOTE) The Xpert Xpress SARS-CoV-2/FLU/RSV plus assay is intended as an aid in the diagnosis of influenza from Nasopharyngeal swab specimens and should not be used as a sole basis for treatment. Nasal washings and aspirates are unacceptable for Xpert Xpress SARS-CoV-2/FLU/RSV testing.  Fact Sheet for Patients: EntrepreneurPulse.com.au  Fact Sheet for Healthcare Providers: IncredibleEmployment.be  This test is not yet approved or cleared by the [place] FDA and has  been authorized for detection and/or diagnosis of SARS-CoV-2 by FDA under an Emergency Use Authorization (EUA). This EUA will remain in effect (meaning this test can be used) for the duration of the COVID-19 declaration under Section 564(b)(1) of the Act, 21 U.S.C. section 360bbb-3(b)(1), unless the authorization is terminated or revoked.  Performed at [facility], [place] [address], [place], [place] [address]   Culture, Respiratory w Gram Stain     Status: None   Collection Time: 08/20/21 10:44 AM   Specimen: Tracheal Aspirate; Respiratory  Result Value Ref Range Status   Specimen Description TRACHEAL ASPIRATE  Final   Special Requests NONE  Final   Gram Stain   Final    MODERATE SQUAMOUS EPITHELIAL CELLS PRESENT ABUNDANT WBC PRESENT, PREDOMINANTLY PMN MODERATE GRAM POSITIVE COCCI MODERATE GRAM NEGATIVE RODS RARE GRAM POSITIVE RODS    Culture   Final    RARE Normal respiratory flora-no Staph aureus or Pseudomonas seen Performed at [facility], [place] [address] [place], [place] [address]    Report Status 08/24/2021 FINAL  Final     Time coordinating discharge:  I have spent 35 minutes face to face with the patient and on the ward discussing the patients care, assessment, plan and disposition with other care givers. >50% of the time was devoted counseling the  patient about the risks and benefits of treatment/Discharge disposition and coordinating care.   SIGNED:   Damita Lack, MD  [facility] 08/24/2021, 9:19 AM   If 7PM-7AM, please contact night-coverage

## 2021-08-25 ENCOUNTER — Telehealth: Payer: Self-pay | Admitting: Family Medicine

## 2021-08-25 MED ORDER — OXYCODONE HCL 5 MG PO CAPS: 5.0000 mg | ORAL_CAPSULE | ORAL | 0 refills | Status: AC | PRN

## 2021-08-25 NOTE — Telephone Encounter (Signed)
LAST APPOINTMENT DATE: 08/15/21 video visit    NEXT APPOINTMENT DATE:     LAST REFILL: 08/23/2021 Dr. Reesa Chew  QTY: #30 no refill

## 2021-08-25 NOTE — Telephone Encounter (Signed)
  Encourage patient to contact the pharmacy for refills or they can request refills through [facility]:  Please schedule appointment if longer than 1 year  NEXT APPOINTMENT DATE:  MEDICATION:oxycodone (OXY-IR) 5 MG capsule   Is the patient out of medication?   [place], [place]   Let patient know to contact pharmacy at the end of the day to make sure medication is ready.  Please notify patient to allow 48-72 hours to process  CLINICAL FILLS OUT ALL BELOW:   LAST REFILL:  QTY:  REFILL DATE:    OTHER COMMENTS:    Okay for refill?  Please advise

## 2021-08-25 NOTE — Telephone Encounter (Signed)
Sent. Thanks.   

## 2021-08-26 NOTE — Telephone Encounter (Signed)
LMTCB

## 2021-08-26 NOTE — Telephone Encounter (Signed)
Please check with family/hospice.  If still having trouble with this then I would like ENT input. Thanks.

## 2021-08-27 ENCOUNTER — Inpatient Hospital Stay (HOSPITAL_COMMUNITY): Admit: 2021-08-27 | Payer: Medicare Other

## 2021-08-27 ENCOUNTER — Telehealth: Payer: Self-pay | Admitting: Family Medicine

## 2021-08-27 ENCOUNTER — Other Ambulatory Visit: Payer: Self-pay

## 2021-08-27 ENCOUNTER — Telehealth: Payer: Self-pay

## 2021-08-27 NOTE — Telephone Encounter (Signed)
Patient's wife also stated he has been more agitated lately and the two medications he was given have made it worse. They had to completely stop the hydroxyzine has it was making the agitation worse and has not given the lorazepam in the last 24 hrs as it has not been working. Is there something else he can try?

## 2021-08-27 NOTE — Telephone Encounter (Signed)
Pt wife returning call. 

## 2021-08-27 NOTE — Telephone Encounter (Signed)
Patient's wife states he is doing better with the trach and has not had as much accumulation with it now. Advised her to let us know if this becomes a problem again.

## 2021-08-27 NOTE — Telephone Encounter (Signed)
See other phone note

## 2021-08-27 NOTE — Telephone Encounter (Signed)
Did hospice offer any guidance here?  Please check with hospice.  Other medication options have potential for side effects and I would like to be on the same page with hospice.  Call me if needed.  Thanks.

## 2021-08-27 NOTE — Telephone Encounter (Signed)
Transition Care Management Follow-up Telephone Call Date of discharge and from where: 08/24/2021 / [facility] How have you been since you were released from the hospital? "Patient is receiving care from hospice" Any questions or concerns? No  Items Reviewed: Did the pt receive and understand the discharge instructions provided? Yes  Medications obtained and verified? Yes  Other? No  Any new allergies since your discharge? No  Dietary orders reviewed? No Do you have support at home? Yes   Home Care and Equipment/Supplies: Were home health services ordered? No  Patient is enrolled in hospice If so, what is the name of the agency? N/a  Has the agency set up a time to come to the patient's home? not applicable Were any new equipment or medical supplies ordered?  Yes:   What is the name of the medical supply agency? Hospice Were you able to get the supplies/equipment? yes Do you have any questions related to the use of the equipment or supplies? No  Functional Questionnaire: (I = Independent and D = Dependent) ADLs: D  Bathing/Dressing- D  Meal Prep- D  Eating- D  Maintaining continence- D  Transferring/Ambulation- D  Managing Meds- D  Follow up appointments reviewed:  PCP Hospital f/u appt confirmed? No  Scheduled to see Patient enrolled in [facility] f/u appt confirmed? No  Scheduled to see  Are transportation arrangements needed? No  If their condition worsens, is the pt aware to call PCP or go to the Emergency Dept.? Yes  Hospice will be contacted for needs Was the patient provided with contact information for the PCP's office or ED? Yes Was to pt encouraged to call back with questions or concerns? Yes

## 2021-08-28 ENCOUNTER — Telehealth: Payer: Self-pay | Admitting: Family Medicine

## 2021-08-28 NOTE — Telephone Encounter (Signed)
LMTCB

## 2021-08-28 NOTE — Telephone Encounter (Signed)
See other TE note about medication change requests.

## 2021-08-28 NOTE — Telephone Encounter (Signed)
Christ[facility] Santiago of [facility] called stating that they would like to discontinue medication QUEtiapine (SEROQUEL) 25 MG tablet, and Hydroxyzine, but would like to change the dosage of LORazepam (ATIVAN) 0.5 MG tablet, to 1-2 tablets every 4hrs.

## 2021-08-28 NOTE — Telephone Encounter (Signed)
Agree.  Please give the order.  Let me know if I need to send an rx.  Thanks.

## 2021-08-29 NOTE — Telephone Encounter (Signed)
error 

## 2021-09-01 NOTE — Telephone Encounter (Signed)
I called his family and gave my condolences.  This kind man always would ask me about my family when he came in for an office visit.  He will be missed.  I thank all involved.    Family thanked me for the call.

## 2021-09-01 NOTE — Telephone Encounter (Signed)
Received notification today from hospice that patient passed away yesterday morning.

## 2021-09-23 DEATH — deceased

## 2022-01-12 ENCOUNTER — Ambulatory Visit: Payer: Medicare Other
